# Patient Record
Sex: Male | Born: 1970 | Race: White | Hispanic: Yes | Marital: Married | Smoking: Never smoker
Health system: Southern US, Community
[De-identification: ages and names within clinical notes are randomized; demographics above are authoritative.]

## PROBLEM LIST (undated history)

## (undated) DIAGNOSIS — Z87898 Personal history of other specified conditions: Secondary | ICD-10-CM

## (undated) DIAGNOSIS — I10 Essential (primary) hypertension: Secondary | ICD-10-CM

## (undated) DIAGNOSIS — N400 Enlarged prostate without lower urinary tract symptoms: Secondary | ICD-10-CM

## (undated) DIAGNOSIS — I519 Heart disease, unspecified: Secondary | ICD-10-CM

## (undated) DIAGNOSIS — G709 Myoneural disorder, unspecified: Secondary | ICD-10-CM

## (undated) DIAGNOSIS — K219 Gastro-esophageal reflux disease without esophagitis: Secondary | ICD-10-CM

## (undated) DIAGNOSIS — IMO0001 Reserved for inherently not codable concepts without codable children: Secondary | ICD-10-CM

## (undated) DIAGNOSIS — Z5189 Encounter for other specified aftercare: Secondary | ICD-10-CM

## (undated) DIAGNOSIS — I2510 Atherosclerotic heart disease of native coronary artery without angina pectoris: Secondary | ICD-10-CM

## (undated) DIAGNOSIS — I729 Aneurysm of unspecified site: Secondary | ICD-10-CM

## (undated) DIAGNOSIS — E785 Hyperlipidemia, unspecified: Secondary | ICD-10-CM

## (undated) DIAGNOSIS — Z8774 Personal history of (corrected) congenital malformations of heart and circulatory system: Secondary | ICD-10-CM

## (undated) DIAGNOSIS — E119 Type 2 diabetes mellitus without complications: Secondary | ICD-10-CM

## (undated) DIAGNOSIS — Z531 Procedure and treatment not carried out because of patient's decision for reasons of belief and group pressure: Secondary | ICD-10-CM

## (undated) HISTORY — DX: Gastro-esophageal reflux disease without esophagitis: K21.9

## (undated) HISTORY — DX: Benign prostatic hyperplasia without lower urinary tract symptoms: N40.0

## (undated) HISTORY — DX: Personal history of other specified conditions: Z87.898

## (undated) HISTORY — PX: BACK SURGERY: SHX140

## (undated) HISTORY — DX: Encounter for other specified aftercare: Z51.89

## (undated) HISTORY — PX: APPENDECTOMY: SHX54

## (undated) HISTORY — DX: Hyperlipidemia, unspecified: E78.5

## (undated) HISTORY — DX: Personal history of (corrected) congenital malformations of heart and circulatory system: Z87.74

## (undated) HISTORY — DX: Heart disease, unspecified: I51.9

## (undated) HISTORY — PX: PATENT DUCTUS ARTERIOUS REPAIR: SHX269

## (undated) HISTORY — DX: Reserved for inherently not codable concepts without codable children: IMO0001

---

## 1999-10-12 ENCOUNTER — Ambulatory Visit (HOSPITAL_COMMUNITY): Admission: RE | Admit: 1999-10-12 | Discharge: 1999-10-12 | Payer: Self-pay | Admitting: Orthopedic Surgery

## 1999-10-12 ENCOUNTER — Encounter: Payer: Self-pay | Admitting: Orthopedic Surgery

## 2000-10-16 ENCOUNTER — Encounter: Payer: Self-pay | Admitting: Family Medicine

## 2000-10-16 ENCOUNTER — Ambulatory Visit (HOSPITAL_COMMUNITY): Admission: RE | Admit: 2000-10-16 | Discharge: 2000-10-16 | Payer: Self-pay | Admitting: Family Medicine

## 2000-10-27 ENCOUNTER — Ambulatory Visit (HOSPITAL_COMMUNITY): Admission: RE | Admit: 2000-10-27 | Discharge: 2000-10-27 | Payer: Self-pay | Admitting: Family Medicine

## 2000-10-27 ENCOUNTER — Encounter: Payer: Self-pay | Admitting: Family Medicine

## 2000-12-11 ENCOUNTER — Ambulatory Visit (HOSPITAL_COMMUNITY): Admission: RE | Admit: 2000-12-11 | Discharge: 2000-12-11 | Payer: Self-pay | Admitting: Internal Medicine

## 2001-07-17 ENCOUNTER — Ambulatory Visit (HOSPITAL_COMMUNITY): Admission: RE | Admit: 2001-07-17 | Discharge: 2001-07-17 | Payer: Self-pay | Admitting: *Deleted

## 2001-07-17 ENCOUNTER — Encounter: Payer: Self-pay | Admitting: *Deleted

## 2002-06-05 ENCOUNTER — Ambulatory Visit (HOSPITAL_COMMUNITY): Admission: RE | Admit: 2002-06-05 | Discharge: 2002-06-05 | Payer: Self-pay | Admitting: Family Medicine

## 2002-06-05 ENCOUNTER — Encounter: Payer: Self-pay | Admitting: Family Medicine

## 2003-11-06 ENCOUNTER — Emergency Department (HOSPITAL_COMMUNITY): Admission: EM | Admit: 2003-11-06 | Discharge: 2003-11-06 | Payer: Self-pay | Admitting: Emergency Medicine

## 2004-10-03 ENCOUNTER — Ambulatory Visit (HOSPITAL_COMMUNITY): Admission: RE | Admit: 2004-10-03 | Discharge: 2004-10-03 | Payer: Self-pay | Admitting: Cardiology

## 2005-03-28 ENCOUNTER — Emergency Department (HOSPITAL_COMMUNITY): Admission: EM | Admit: 2005-03-28 | Discharge: 2005-03-28 | Payer: Self-pay | Admitting: Emergency Medicine

## 2005-04-09 ENCOUNTER — Emergency Department (HOSPITAL_COMMUNITY): Admission: EM | Admit: 2005-04-09 | Discharge: 2005-04-09 | Payer: Self-pay | Admitting: Emergency Medicine

## 2005-07-08 ENCOUNTER — Ambulatory Visit: Payer: Self-pay | Admitting: Sports Medicine

## 2005-08-22 ENCOUNTER — Emergency Department (HOSPITAL_COMMUNITY): Admission: EM | Admit: 2005-08-22 | Discharge: 2005-08-22 | Payer: Self-pay | Admitting: Family Medicine

## 2005-09-09 ENCOUNTER — Emergency Department (HOSPITAL_COMMUNITY): Admission: EM | Admit: 2005-09-09 | Discharge: 2005-09-09 | Payer: Self-pay | Admitting: Family Medicine

## 2005-09-15 ENCOUNTER — Ambulatory Visit: Payer: Self-pay | Admitting: Sports Medicine

## 2005-09-19 HISTORY — PX: CARDIAC CATHETERIZATION: SHX172

## 2006-01-03 ENCOUNTER — Ambulatory Visit: Payer: Self-pay | Admitting: Family Medicine

## 2006-02-14 ENCOUNTER — Ambulatory Visit: Payer: Self-pay | Admitting: Family Medicine

## 2006-02-21 ENCOUNTER — Ambulatory Visit: Payer: Self-pay | Admitting: Family Medicine

## 2006-03-02 ENCOUNTER — Ambulatory Visit: Payer: Self-pay | Admitting: Family Medicine

## 2006-11-16 DIAGNOSIS — J45909 Unspecified asthma, uncomplicated: Secondary | ICD-10-CM | POA: Insufficient documentation

## 2006-11-16 DIAGNOSIS — E669 Obesity, unspecified: Secondary | ICD-10-CM | POA: Insufficient documentation

## 2006-11-16 DIAGNOSIS — J45901 Unspecified asthma with (acute) exacerbation: Secondary | ICD-10-CM | POA: Insufficient documentation

## 2006-11-16 DIAGNOSIS — F5232 Male orgasmic disorder: Secondary | ICD-10-CM

## 2006-11-24 ENCOUNTER — Telehealth: Payer: Self-pay | Admitting: *Deleted

## 2007-10-19 ENCOUNTER — Ambulatory Visit: Payer: Self-pay | Admitting: Family Medicine

## 2007-12-11 ENCOUNTER — Ambulatory Visit: Payer: Self-pay | Admitting: Family Medicine

## 2007-12-11 ENCOUNTER — Encounter: Payer: Self-pay | Admitting: Family Medicine

## 2007-12-11 LAB — CONVERTED CEMR LAB
Alkaline Phosphatase: 115 units/L (ref 39–117)
BUN: 17 mg/dL (ref 6–23)
CO2: 25 meq/L (ref 19–32)
Cholesterol: 217 mg/dL — ABNORMAL HIGH (ref 0–200)
Eosinophils Absolute: 0.1 10*3/uL (ref 0.0–0.7)
Eosinophils Relative: 2 % (ref 0–5)
Glucose, Bld: 90 mg/dL (ref 70–99)
HCT: 43.4 % (ref 39.0–52.0)
HDL: 34 mg/dL — ABNORMAL LOW (ref >39)
Hemoglobin: 14.8 g/dL (ref 13.0–17.0)
Lymphocytes Relative: 24 % (ref 12–46)
Lymphs Abs: 1.5 10*3/uL (ref 0.7–4.0)
MCV: 76.8 fL — ABNORMAL LOW (ref 78.0–100.0)
Monocytes Absolute: 0.4 10*3/uL (ref 0.1–1.0)
Monocytes Relative: 7 % (ref 3–12)
RBC: 5.65 M/uL (ref 4.22–5.81)
Total Bilirubin: 0.8 mg/dL (ref 0.3–1.2)
Triglycerides: 880 mg/dL — ABNORMAL HIGH (ref <150)
WBC: 6.1 10*3/uL (ref 4.0–10.5)

## 2007-12-12 ENCOUNTER — Encounter: Payer: Self-pay | Admitting: Family Medicine

## 2007-12-26 ENCOUNTER — Telehealth (INDEPENDENT_AMBULATORY_CARE_PROVIDER_SITE_OTHER): Payer: Self-pay | Admitting: Family Medicine

## 2007-12-27 ENCOUNTER — Encounter: Payer: Self-pay | Admitting: Family Medicine

## 2007-12-27 ENCOUNTER — Ambulatory Visit: Payer: Self-pay | Admitting: Family Medicine

## 2008-01-18 ENCOUNTER — Encounter: Payer: Self-pay | Admitting: Family Medicine

## 2008-01-25 HISTORY — PX: NM MYOCAR PERF WALL MOTION: HXRAD629

## 2008-01-31 ENCOUNTER — Ambulatory Visit: Payer: Self-pay | Admitting: Family Medicine

## 2008-01-31 DIAGNOSIS — I201 Angina pectoris with documented spasm: Secondary | ICD-10-CM

## 2008-02-01 ENCOUNTER — Encounter: Payer: Self-pay | Admitting: Family Medicine

## 2008-03-05 ENCOUNTER — Emergency Department (HOSPITAL_COMMUNITY): Admission: EM | Admit: 2008-03-05 | Discharge: 2008-03-05 | Payer: Self-pay | Admitting: Emergency Medicine

## 2008-05-22 ENCOUNTER — Ambulatory Visit: Payer: Self-pay | Admitting: Family Medicine

## 2008-05-22 ENCOUNTER — Telehealth: Payer: Self-pay | Admitting: *Deleted

## 2008-06-05 ENCOUNTER — Telehealth: Payer: Self-pay | Admitting: *Deleted

## 2008-12-12 ENCOUNTER — Encounter: Payer: Self-pay | Admitting: Family Medicine

## 2009-05-08 ENCOUNTER — Emergency Department (HOSPITAL_COMMUNITY): Admission: EM | Admit: 2009-05-08 | Discharge: 2009-05-08 | Payer: Self-pay | Admitting: Emergency Medicine

## 2009-07-10 ENCOUNTER — Telehealth: Payer: Self-pay | Admitting: Sports Medicine

## 2009-07-10 ENCOUNTER — Ambulatory Visit: Payer: Self-pay | Admitting: Family Medicine

## 2009-12-03 ENCOUNTER — Emergency Department (HOSPITAL_COMMUNITY)
Admission: EM | Admit: 2009-12-03 | Discharge: 2009-12-03 | Payer: Self-pay | Source: Home / Self Care | Admitting: Emergency Medicine

## 2009-12-03 ENCOUNTER — Encounter: Payer: Self-pay | Admitting: Sports Medicine

## 2009-12-31 ENCOUNTER — Emergency Department (HOSPITAL_COMMUNITY): Admission: EM | Admit: 2009-12-31 | Discharge: 2009-12-31 | Payer: Self-pay | Admitting: Emergency Medicine

## 2010-07-07 ENCOUNTER — Encounter: Payer: Self-pay | Admitting: Family Medicine

## 2010-07-12 ENCOUNTER — Encounter: Payer: Self-pay | Admitting: Sports Medicine

## 2010-08-24 ENCOUNTER — Emergency Department (HOSPITAL_COMMUNITY)
Admission: EM | Admit: 2010-08-24 | Discharge: 2010-08-25 | Payer: Self-pay | Source: Home / Self Care | Admitting: Emergency Medicine

## 2010-08-24 ENCOUNTER — Telehealth: Payer: Self-pay | Admitting: *Deleted

## 2010-08-24 ENCOUNTER — Encounter: Payer: Self-pay | Admitting: Sports Medicine

## 2010-08-24 ENCOUNTER — Ambulatory Visit: Payer: Self-pay | Admitting: Family Medicine

## 2010-08-24 ENCOUNTER — Ambulatory Visit (HOSPITAL_COMMUNITY)
Admission: RE | Admit: 2010-08-24 | Discharge: 2010-08-24 | Payer: Self-pay | Source: Home / Self Care | Admitting: Family Medicine

## 2010-08-24 DIAGNOSIS — R079 Chest pain, unspecified: Secondary | ICD-10-CM

## 2010-08-25 ENCOUNTER — Telehealth: Payer: Self-pay | Admitting: *Deleted

## 2010-08-27 ENCOUNTER — Ambulatory Visit: Payer: Self-pay | Admitting: Family Medicine

## 2010-09-01 ENCOUNTER — Encounter: Payer: Self-pay | Admitting: Sports Medicine

## 2010-09-01 ENCOUNTER — Ambulatory Visit (HOSPITAL_COMMUNITY)
Admission: RE | Admit: 2010-09-01 | Discharge: 2010-09-01 | Payer: Self-pay | Source: Home / Self Care | Admitting: Family Medicine

## 2010-09-01 ENCOUNTER — Inpatient Hospital Stay (HOSPITAL_COMMUNITY)
Admission: AD | Admit: 2010-09-01 | Discharge: 2010-09-02 | Payer: Self-pay | Attending: Family Medicine | Admitting: Family Medicine

## 2010-09-01 ENCOUNTER — Encounter (INDEPENDENT_AMBULATORY_CARE_PROVIDER_SITE_OTHER): Payer: Self-pay | Admitting: Emergency Medicine

## 2010-09-02 HISTORY — PX: CARDIAC CATHETERIZATION: SHX172

## 2010-09-07 ENCOUNTER — Ambulatory Visit: Payer: Self-pay | Admitting: Family Medicine

## 2010-09-07 ENCOUNTER — Encounter: Payer: Self-pay | Admitting: Sports Medicine

## 2010-09-07 DIAGNOSIS — E1142 Type 2 diabetes mellitus with diabetic polyneuropathy: Secondary | ICD-10-CM | POA: Insufficient documentation

## 2010-09-07 DIAGNOSIS — K219 Gastro-esophageal reflux disease without esophagitis: Secondary | ICD-10-CM | POA: Insufficient documentation

## 2010-09-15 ENCOUNTER — Telehealth: Payer: Self-pay | Admitting: *Deleted

## 2010-10-19 NOTE — Progress Notes (Signed)
Summary: Triage call  Phone Note Call from Patient   Caller: Patient Call For: [phone] Summary of Call: Want to come in this afternnoon for congestion with cough.  Vomitting.  Asthma flareup.  Daughter Luther Parody dob 06/21/1995 is having congestion with cough.   Symptoms ongoing x 1 wk.   Initial call taken by: Abundio Miu,  August 24, 2010 10:35 AM  Follow-up for Phone Call        Both he and daughter have had cough and congesiton for past 3 dyas and is not getitng any better.  Feels like it may be bronchitis.  Would like to be seen.  Gave a [place] appt for both this pm. Follow-up by: Dennison Nancy RN,  August 24, 2010 10:49 AM

## 2010-10-19 NOTE — Miscellaneous (Signed)
Summary: feels weak  Clinical Lists Changes has chills & weak x 2 days. lightheaded. "minor cp" located at L &center of chest. comes & goes. take aleve & asa. SOB , he attributes to his asthma. sweaty due to working outside. endorses nausea. reviewed chart. sent to ED. he agrees.Golden Circle RN  December 03, 2009 2:01 PM

## 2010-10-19 NOTE — Assessment & Plan Note (Signed)
Summary: Cough and congestion/kf   Vital Signs:  Patient profile:   40 year old male Weight:      226 pounds BMI:     34.49 Temp:     98.2 degrees F oral Pulse rate:   97 / minute Pulse rhythm:   regular BP sitting:   126 / 88  (left arm) Cuff size:   regular  Vitals Entered By: Loralee Pacas CMA (August 24, 2010 3:23 PM) CC: cough and congestion   Primary Provider:  Rodney Langton MD  CC:  cough and congestion.  History of Present Illness: Pt is a 40 year old male with HLD, Hypertriglicerademia, obesity, asthma, and an abnormal myoview in 2006 who presents today with a one month history chest pain that has been made worse by exertion.  It has been getting gradually worse throughout the month but has been made particularly worse this past week.  For the past week he has been having increased congestion, cough productive of dark green sputum with occasional blood, and increased wheezing.  He has not been running a fever but he has had some chills.  His chest pain has become significantly worse and was particularly bad this morning, which is part of the reason came in to be seen today.  It is left sided to substernal, crushing in character, radiates down his arm, is made worse with exertion and better with rest, and is associated with some left hand numbness.  The patient has been seen by Dr. Nadara Eaton of [facility] before and has been followed there.  Unfortunately, he was uninsured until recently and was unable to afford care for about the past six months.  He ran out of medications a month ago and has been without either his asthma medications or the medications he takes for his lipids for the past month.  (He is uncertain exactly which medications he takes for lipids and, as best as I can tell, these were prescribed by his cardiologist not [place].)  Allergies: No Known Drug Allergies  Past History:  Past Medical History: Last updated: 05/22/2008 hx PDA repair  age 58 h/o cardiac cath [facility] cards) in 2007 asthma (hsop for exacerbation 10 years ago)   Family History: Last updated: 12/27/2007 Father had MI in 55s Mother had CABG  Risk Factors: Smoking Status: never (12/11/2007)  Family History: Reviewed history from 12/27/2007 and no changes required. Father had MI in 71s Mother had CABG  Social History: Reviewed history from 12/11/2007 and no changes required. Lives with wife and 2 kids.  Works for [employer].  Review of Systems       The patient complains of chest pain, dyspnea on exertion, prolonged cough, and hemoptysis.  The patient denies anorexia, fever, weight loss, weight gain, vision loss, decreased hearing, hoarseness, syncope, peripheral edema, headaches, abdominal pain, melena, hematochezia, severe indigestion/heartburn, muscle weakness, suspicious skin lesions, transient blindness, and difficulty walking.    Physical Exam  General:  Large man with audiable wheezing Nose:  moving good air Neck:  Short neck, no adenopathy Lungs:  Diffuse wheezing with somewhat decreased air movement Heart:  normal rate and regular rhythm.  normal rate and regular rhythm.   Abdomen:  soft, non-tender, normal bowel sounds, and no masses.   Msk:  5+ strength all extremities Pulses:  2+ radial and posterior tibial bilaterally Extremities:  No clubbing, cyanosis, edema, or deformity noted with normal full range of motion of all joints.     Impression &  Recommendations:  Problem # 1:  CHEST PAIN (ICD-786.50) EKG demonstrates normal sinus rhythm with no acute ST segment changes.  Feel that the patient is likely not experiencing ACS but we cannot rule this out.  The patient does have a strong history that is worrisome for cardiac disease and, at least from a review of our records, would appear to have possible coronary artery disease.  Other processes that are to be considered are pneumonia and chest wall pain.  Recommend that the  patient proceed to the emergency room to be ruled out for ACS and to have a CXR performed to rule out any possible pneumonia.  Do not feel that this patient will likely require an admission.  Will also arrange for the patient to have a referral to cardiology, as they have seen him in the past.  Orders: EKG- [facility] (EKG) Cardiology Referral (Cardiology) [facility]- Est Level  3 ([address]366)  Problem # 2:  ASTHMA, PERSISTENT (ICD-493.90)  Patient is in an asthma exacerbation and this likely contributes to all his symptoms.  His oxygen saturation is 97% here on room air and he is in no respiratory distress.  Will give a single dose of IM solumedrol and prescribe a course of by mouth prednisone.  Will also refill the patient's home medications for this problem.  Recommend followup in the next 2-3 days to ensure resolution.  His updated medication list for this problem includes:    Albuterol Sulfate (2.5 Mg/30ml) 0.083% Nebu (Albuterol sulfate) .Marland KitchenMarland KitchenMarland KitchenMarland Kitchen 3 ml per treatment or 2.5 mg per treatment q 4 hours as needed for wheezing, qs    Ventolin Hfa 108 (90 Base) Mcg/act Aers (Albuterol sulfate) .Marland Kitchen... 2 putts qid as needed    Singulair 10 Mg Tabs (Montelukast sodium) ..... One daily    Qvar 80 Mcg/act Aers (Beclomethasone dipropionate) ..... One puff two times a day    Prednisone 50 Mg Tabs (Prednisone) .Marland Kitchen... Take one daily by mouth for 7 days  Orders: Solumedrol up to 125mg  (Y4034) [facility]- Est Level  3 (74259)  Complete Medication List: 1)  Albuterol Sulfate (2.5 Mg/59ml) 0.083% Nebu (Albuterol sulfate) .... 3 ml per treatment or 2.5 mg per treatment q 4 hours as needed for wheezing, qs 2)  Flexeril 5 Mg Tabs (Cyclobenzaprine hcl) .Marland Kitchen.. 1 tab by mouth at bedtime for muscle spasm 3)  Breatherite Coll Spacer Adult Misc (Spacer/aero-holding chambers) .... Use spacer with inhaler to get more medicine in your lungs 4)  Ventolin Hfa 108 (90 Base) Mcg/act Aers (Albuterol sulfate) .... 2 putts qid as needed 5)  Niaspan 500 Mg  Tbcr (Niacin (antihyperlipidemic)) .Marland Kitchen.. 1 tab by mouth qhs 6)  Trilipix 135 Mg Cpdr (Choline fenofibrate) .Marland Kitchen.. 1 tab by mouth daily. 7)  Lovaza 1 Gm Caps (Omega-3-acid ethyl esters) .Marland Kitchen.. 1 tab by mouth daily. 8)  Doxycycline Hyclate 100 Mg Tabs (Doxycycline hyclate) .... Two times a day for 10 days 9)  Singulair 10 Mg Tabs (Montelukast sodium) .... One daily 10)  Qvar 80 Mcg/act Aers (Beclomethasone dipropionate) .... One puff two times a day 11)  Prednisone 50 Mg Tabs (Prednisone) .... Take one daily by mouth for 7 days  Patient Instructions: 1)  It was good to see you today. 2)  Please go over the the emergency room for a further evaluation of your chest pain.  I do not think you are having a heart attack but we can't be sure here in the office. 3)  I have given you a prescription for some  steroids by mouth.  Please take them for your asthma. 4)  I have refilled your asthma medications. 5)  We will make a referral to cardiology.  You will be contacted with an appointment. 6)  Please come back in 2-3 days so we can make sure your breathing is getting better. Prescriptions: QVAR 80 MCG/ACT AERS (BECLOMETHASONE DIPROPIONATE) one puff two times a day Brand medically necessary #1 x 11   Entered and Authorized by:   Majel Homer MD   Signed by:   Majel Homer MD on 08/24/2010   Method used:   Electronically to        [facility] [address] (retail)       [address]       [place], [place]  [address]       Ph: [phone] or [phone]       Fax: [phone]   RxID:   [id number] PREDNISONE 50 MG TABS (PREDNISONE) Take one daily by mouth for 7 days  #7 x 0   Entered and Authorized by:   Majel Homer MD   Signed by:   Majel Homer MD on 08/24/2010   Method used:   Electronically to        [facility] [address] (retail)       [address]       [place], [place]  [address]       Ph: [phone] or  [phone]       Fax: [phone]   RxID:   [id number] SINGULAIR 10 MG TABS (MONTELUKAST SODIUM) one daily Brand medically necessary #30 x 11   Entered and Authorized by:   Majel Homer MD   Signed by:   Majel Homer MD on 08/24/2010   Method used:   Electronically to        [facility] [address] (retail)       [address]       [place], [place]  [address]       Ph: [phone] or [phone]       Fax: [phone]   RxID:   [id number] VENTOLIN HFA 108 (90 BASE) MCG/ACT AERS (ALBUTEROL SULFATE) 2 putts qid as needed  #1 x 6   Entered and Authorized by:   Majel Homer MD   Signed by:   Majel Homer MD on 08/24/2010   Method used:   Electronically to        [facility] [address] (retail)       [address]       [place], [place]  [address]       Ph: [phone] or [phone]       Fax: [phone]   RxID:   [id number] ALBUTEROL SULFATE (2.5 MG/3ML) 0.083% NEBU (ALBUTEROL SULFATE) 3 ml per treatment or 2.5 mg per treatment q 4 hours as needed for wheezing, QS  #1 x 11   Entered and Authorized by:   Majel Homer MD   Signed by:   Majel Homer MD on 08/24/2010   Method used:   Electronically to        [facility] [address] (retail)       [address]       [facility]  [place], [place]  [address]       Ph: [phone] or [phone]       Fax: [phone]   RxID:   [id number]    Medication Administration  Injection # 1:    Medication: Solumedrol up to 125mg     Diagnosis: ASTHMA, PERSISTENT (ICD-493.90)    Route: IM    Site: RUOQ gluteus    Exp Date: 12/18/2012    Lot #: [id number]    Mfr: [email or web address]    Patient tolerated injection without complications    Given by: Arlyss Repress CMA, (August 24, 2010 5:36 PM)  Orders Added: 1)  EKG- [facility] [EKG] 2)  Cardiology Referral [Cardiology] 3)  Solumedrol up to 125mg  [J2930] 4)  [facility]- Est Level  3  [[address]]     Medication Administration  Injection # 1:    Medication: Solumedrol up to 125mg     Diagnosis: ASTHMA, PERSISTENT (ICD-493.90)    Route: IM    Site: RUOQ gluteus    Exp Date: 12/18/2012    Lot #: [id number]    Mfr: pfizer    Patient tolerated injection without complications    Given by: Arlyss Repress CMA, (August 24, 2010 5:36 PM)  Orders Added: 1)  EKG- [facility] [EKG] 2)  Cardiology Referral [Cardiology] 3)  Solumedrol up to 125mg  [J2930] 4)  [facility]- Est Level  3 [[address]]

## 2010-10-19 NOTE — Progress Notes (Signed)
 Summary: triage  Phone Note Call from Patient Call back at [phone]   Caller: Patient Summary of Call: pt asking to be seen this afternoon because feeling congested and asthma problems. Initial call taken by: Madelin Daring,  July 10, 2009 1:45 PM  Follow-up for Phone Call        taking meds but feeling worse since last night. work in at 3. aware of wait time Follow-up by: Ginnie Mau RN,  July 10, 2009 1:49 PM  Additional Follow-up for Phone Call Additional follow up Details #1::        Noted, thanks. Additional Follow-up by: Debby Petties MD,  July 10, 2009 1:52 PM

## 2010-10-19 NOTE — Miscellaneous (Signed)
  Clinical Lists Changes  Problems: Changed problem from ASTHMA, UNSPECIFIED (ICD-493.90) to ASTHMA, PERSISTENT (ICD-493.90) 

## 2010-10-19 NOTE — Miscellaneous (Signed)
  Clinical Lists Changes  Medications: Changed medication from VENTOLIN HFA 108 (90 BASE) MCG/ACT AERS (ALBUTEROL SULFATE) 2 puffs q 4 hours as needed to VENTOLIN HFA 108 (90 BASE) MCG/ACT AERS (ALBUTEROL SULFATE) 2 putts qid as needed - Signed Rx of VENTOLIN HFA 108 (90 BASE) MCG/ACT AERS (ALBUTEROL SULFATE) 2 putts qid as needed;  #1 x 6;  Signed;  Entered by: Luretha Murphy NP;  Authorized by: Luretha Murphy NP;  Method used: Electronically to [facility]  [phone]*, [address] [place], [place], [place]  [address], Ph: [phone] or [phone], Fax: [phone]    Prescriptions: VENTOLIN HFA 108 (90 BASE) MCG/ACT AERS (ALBUTEROL SULFATE) 2 putts qid as needed  #1 x 6   Entered and Authorized by:   Luretha Murphy NP   Signed by:   Luretha Murphy NP on 07/07/2010   Method used:   Electronically to        [facility]  [phone]* (retail)       [address]       [place], [place]  [address]       Ph: [phone] or [phone]       Fax: [phone]   RxID:   [id number]

## 2010-10-19 NOTE — Progress Notes (Signed)
Summary: referral  Phone Note Call from Patient Call back at Home Phone [phone]   Caller: Patient Summary of Call: pt was told to call today to find out about cardiology referral Initial call taken by: De Nurse,  August 25, 2010 2:02 PM  Follow-up for Phone Call        pt called again about referral  has appt w. Dr T 12/14 Follow-up by: De Nurse,  August 26, 2010 4:17 PM  Additional Follow-up for Phone Call Additional follow up Details #1::        referral made to [facility], informed pt of this Additional Follow-up by: Loralee Pacas CMA,  August 27, 2010 10:07 AM

## 2010-10-21 NOTE — Assessment & Plan Note (Signed)
Summary: hosp follow up/tlb   Vital Signs:  Patient profile:   40 year old male Weight:      221 pounds Temp:     98.9 degrees F oral Pulse rate:   108 / minute Pulse rhythm:   regular BP sitting:   135 / 83  (left arm) Cuff size:   regular  Vitals Entered By: Loralee Pacas CMA (September 07, 2010 11:16 AM)  Primary Care Kennetta Pavlovic:  Rodney Langton MD   History of Present Illness: 40 yo male here for HFU.  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  No MI during hospitalization.  Risk factor management recommended.  On ASA 325.  Still gets occasional CP.  Cath site on R wrist well healed.  Currently wearing event monitor placed by cards.  DM2:  A1c 6.2% in hospital.  Started on metformin XR which he has not yet started taking.  HLD:  Restarted on fenofibrate and pravastatin.    Hematochezia:   Noted small flecs blood in recent stool.  No weight loss, constipation, pencil stools.  Reflux:  Has had heartburn for a long time now, needs refill on omeprazole.  Current Medications (verified): 1)  Breatherite Coll Spacer Adult   Misc (Spacer/aero-Holding Chambers) .... Use Spacer With Inhaler To Get More Medicine in Your Lungs 2)  Ventolin Hfa 108 (90 Base) Mcg/act Aers (Albuterol Sulfate) .... 2 Putts Qid As Needed 3)  Singulair 10 Mg Tabs (Montelukast Sodium) .... One Daily 4)  Advair Diskus 500-50 Mcg/dose Aepb (Fluticasone-Salmeterol) .... One Puff Two Times A Day 5)  Pravastatin Sodium 40 Mg Tabs (Pravastatin Sodium) .... One Tab By Mouth Daily 6)  Trilipix 135 Mg Cpdr (Choline Fenofibrate) .... One Tab By Mouth Daily 7)  Aspirin 81 Mg Tbec (Aspirin) .... One Tab By Mouth Daily 8)  Prilosec 40 Mg Cpdr (Omeprazole) .... One Tab By Mouth At Dinnertime.  Allergies (verified): No Known Drug Allergies  Past History:  Past Medical History: Last updated:  09/01/2010 Familial hyperlipidemia Asthma  Past Surgical History: hx PDA repair age 70 clean cardiac cath [facility] cards) in 2007, and with RCA 30% narrowing likely due to catheter induced spasm in 09/02/2010.  Review of Systems       See HPI  Physical Exam  General:  Well-developed,well-nourished,in no acute distress; alert,appropriate and cooperative throughout examination Lungs:  Normal respiratory effort, chest expands symmetrically. Lungs are clear to auscultation, no crackles or wheezes. Heart:  Normal rate and regular rhythm. S1 and S2 normal without gallop, murmur, click, rub or other extra sounds. Abdomen:  Bowel sounds positive,abdomen soft and non-tender without masses, organomegaly or hernias noted. Extremities:  No edema.  R wrist catheter site well healed.   Impression & Recommendations:  Problem # 1:  HEMATOCHEZIA (ICD-578.1) Assessment New Stool cards given.  Orders: Hemoccult Cards (Take Home) (Hemoccult Cards)  Problem # 2:  GERD (ICD-530.81) Assessment: New Better s/p GI cocktail in office. Omeprazole rxed.  His updated medication list for this problem includes:    Prilosec 40 Mg Cpdr (Omeprazole) ..... One tab by mouth at dinnertime.  Orders: [facility]- Est  Level 4 ([address]) EMR miscellaneous medications (EMRORAL)  Problem # 3:  CHEST PAIN (ICD-786.50) Assessment: Improved Occasionally present still.  Unlikely cardiac in origin. Pt will make fu appt with cards in 1-2 weeks [facility]) We can consider long acting nitrates vs dyhidropyridine CCB if persists.  Orders: [facility]- Est  Level 4 ([address])  Problem # 4:  HYPERTRIGLYCERIDEMIA, SEVERE (ICD-272.4) Assessment: Improved Fenofibrate and pravachol. Will recheck FLP with dLDL in 3 months.  The following medications were removed from the medication list:    Niaspan 500 Mg Tbcr (Niacin (antihyperlipidemic)) .Marland Kitchen... 1 tab by mouth qhs    Trilipix 135 Mg Cpdr (Choline fenofibrate) .Marland Kitchen... 1 tab by mouth  daily.    Lovaza 1 Gm Caps (Omega-3-acid ethyl esters) .Marland Kitchen... 1 tab by mouth daily. His updated medication list for this problem includes:    Pravastatin Sodium 40 Mg Tabs (Pravastatin sodium) ..... One tab by mouth daily    Trilipix 135 Mg Cpdr (Choline fenofibrate) ..... One tab by mouth daily  Problem # 5:  DIABETES MELLITUS, TYPE II (ICD-250.00) Assessment: New Cont metformin. Rechecking A1c in 3 months.  His updated medication list for this problem includes:    Aspirin 81 Mg Tbec (Aspirin) ..... One tab by mouth daily    Metformin Hcl 500 Mg Xr24h-tab (Metformin hcl)  Complete Medication List: 1)  Breatherite Coll Spacer Adult Misc (Spacer/aero-holding chambers) .... Use spacer with inhaler to get more medicine in your lungs 2)  Ventolin Hfa 108 (90 Base) Mcg/act Aers (Albuterol sulfate) .... 2 putts qid as needed 3)  Singulair 10 Mg Tabs (Montelukast sodium) .... One daily 4)  Advair Diskus 500-50 Mcg/dose Aepb (Fluticasone-salmeterol) .... One puff two times a day 5)  Pravastatin Sodium 40 Mg Tabs (Pravastatin sodium) .... One tab by mouth daily 6)  Trilipix 135 Mg Cpdr (Choline fenofibrate) .... One tab by mouth daily 7)  Aspirin 81 Mg Tbec (Aspirin) .... One tab by mouth daily 8)  Prilosec 40 Mg Cpdr (Omeprazole) .... One tab by mouth at dinnertime. 9)  Metformin Hcl 500 Mg Xr24h-tab (Metformin hcl)  Patient Instructions: 1)  Great to see you, 2)  You can return to work. 3)  Continue taking all your meds as directed. 4)  Call the cardiologist for an appt in the next 1-2 weeks. 5)  Checking stool cards for your blood in the stool. 6)  Omeprazole for hearburn. 7)  Change aspirin to 81mg  daily, stop the 325. 8)  Come back to see me in a month to see how you are doing. 9)  We can space out visits after that. 10)  -Dr. Karie Schwalbe. Prescriptions: PRILOSEC 40 MG CPDR (OMEPRAZOLE) One tab by mouth at dinnertime.  #90 x 3   Entered and Authorized by:   Rodney Langton MD   Signed  by:   Rodney Langton MD on 09/07/2010   Method used:   Electronically to        [facility] [address] (retail)       [address]       [place], [place]  [address]       Ph: [phone] or [phone]       Fax: [phone]   RxID:   [id number]    Medication Administration  Medication # 1:    Medication: EMR miscellaneous medications    Diagnosis: GERD (ICD-530.81)    Dose: 30ml    Route: po    Exp Date: 12/25/2010    Lot #: [id number]    Mfr: [facility]    Patient tolerated medication without complications    Given by: Loralee Pacas CMA (September 07, 2010 12:15 PM)  Orders Added: 1)  [facility]- Est  Level 4 [[address]] 2)  Hemoccult  Cards (Take Home) [Hemoccult Cards] 3)  EMR miscellaneous medications [EMRORAL]     Appended Document: hosp follow up/tlb    Clinical Lists Changes  Medications: Added new medication of AMLODIPINE BESYLATE 2.5 MG TABS (AMLODIPINE BESYLATE) One tab by mouth daily - Signed Rx of AMLODIPINE BESYLATE 2.5 MG TABS (AMLODIPINE BESYLATE) One tab by mouth daily;  #90 x 3;  Signed;  Entered by: Rodney Langton MD;  Authorized by: Rodney Langton MD;  Method used: Electronically to [facility] [address] [place], [place], [place]  [address], Ph: [phone] or [phone], Fax: [phone]    Prescriptions: AMLODIPINE BESYLATE 2.5 MG TABS (AMLODIPINE BESYLATE) One tab by mouth daily  #90 x 3   Entered and Authorized by:   Rodney Langton MD   Signed by:   Rodney Langton MD on 09/07/2010   Method used:   Electronically to        [facility] [address] (retail)       [address]       [place], [place]  [address]       Ph: [phone] or [phone]       Fax: [phone]   RxID:   [id number]    Appended Document: hemoccult cards NEGATIVE    Lab Visit  Laboratory Results  Date/Time  Received: September 14, 2010  Date/Time Reported: September 14, 2010 4:40 PM   Stool - Occult Blood Hemmoccult #1: negative Date: 09/07/2010 Hemoccult #2: negative Hemoccult #3: negative Comments: no dates on cards 2 or 3 ...............test performed by......Marland KitchenBonnie A. [place], MLS (ASCP)cm   Orders Today: Miscellaneous Lab Charge-[facility] [phone]

## 2010-10-21 NOTE — Assessment & Plan Note (Signed)
Summary: HOSPITAL ADMISSION  pt requested that we call his employer [employer] Pac[employer] [phone] to inform them that he was being admitted to the [facility]  September 01, 2010 11:42 AM   Vital Signs:  Patient profile:   40 year old male Height:      68 inches Weight:      221.1 pounds BMI:     33.74 Temp:     97.7 degrees F oral Pulse rate:   91 / minute BP sitting:   115 / 80  (left arm) Cuff size:   regular  Vitals Entered By: Jimmy Footman, CMA (September 01, 2010 9:39 AM) CC: follow-up visit, numbness in left hand & fingers Is Patient Diabetic? No   Primary Care Provider:  Rodney Langton MD  CC:  follow-up visit and numbness in left hand & fingers.  History of Present Illness: 40 yo male with hx familial hyperlipidema and non-compliance with meds due to financial issues here with CP.  CP:  Pt has been seen by Dr. Jacinto Halim as [facility] several times, cath clean in 2006, [place] in 2009 with ant ischemia but low risk as pt got to 12 mets.  Decision made to tx aggressively with medical mgt and re-cath if CP persistent.  Pt has been having months of CP, substernal described as pressure, radiates to L arm and causes numbness, nausae and diaphoresis present, worse with exertion, better with rest, lasts a few mins, better with NTG.  Pt has recently started to have episodes at rest.  In office given NTG x 2 and CP resolved.  Placed on O2 and given ASA to chew.  Habits & Providers  Alcohol-Tobacco-Diet     Tobacco Status: never  Current Medications (verified): 1)  Albuterol Sulfate (2.5 Mg/91ml) 0.083% Nebu (Albuterol Sulfate) .... 3 Ml Per Treatment or 2.5 Mg Per Treatment Q 4 Hours As Needed For Wheezing, Qs 2)  Flexeril 5 Mg  Tabs (Cyclobenzaprine Hcl) .Marland Kitchen.. 1 Tab By Mouth At Bedtime For Muscle Spasm 3)  Breatherite Coll Spacer Adult   Misc (Spacer/aero-Holding Chambers) .... Use Spacer With Inhaler To Get More Medicine in Your Lungs 4)  Ventolin Hfa 108 (90 Base) Mcg/act Aers  (Albuterol Sulfate) .... 2 Putts Qid As Needed 5)  Niaspan 500 Mg Tbcr (Niacin (Antihyperlipidemic)) .Marland Kitchen.. 1 Tab By Mouth Qhs 6)  Trilipix 135 Mg  Cpdr (Choline Fenofibrate) .Marland Kitchen.. 1 Tab By Mouth Daily. 7)  Lovaza 1 Gm  Caps (Omega-3-Acid Ethyl Esters) .Marland Kitchen.. 1 Tab By Mouth Daily. 8)  Singulair 10 Mg Tabs (Montelukast Sodium) .... One Daily 9)  Qvar 80 Mcg/act Aers (Beclomethasone Dipropionate) .... One Puff Two Times A Day  Allergies (verified): No Known Drug Allergies  Past History:  Family History: Last updated: 12/27/2007 Father had MI in 75s Mother had CABG  Social History: Last updated: 08/24/2010 Lives with wife and 2 kids.  Works for [employer].  Past Medical History: Familial hyperlipidemia Asthma  Past Surgical History: hx PDA repair age 66 clean cardiac cath [facility] cards) in 2007  Family History: Reviewed history from 12/27/2007 and no changes required. Father had MI in 79s Mother had CABG  Social History: Reviewed history from 08/24/2010 and no changes required. Lives with wife and 2 kids.  Works for [employer].  Review of Systems       See HPI  Physical Exam  General:  Well-developed,well-nourished,in no acute distress; alert,appropriate and cooperative throughout examination Head:  Normocephalic and atraumatic without obvious abnormalities. No apparent alopecia or  balding. Eyes:  No corneal or conjunctival inflammation noted. EOMI. Perrla.  Ears:  External ear exam shows no significant lesions or deformities.   Nose:  External nasal examination shows no deformity or inflammation.  Mouth:  Oral mucosa and oropharynx without lesions or exudates.   Neck:  No deformities, masses, or tenderness noted. Lungs:  Normal respiratory effort, chest expands symmetrically. Lungs are clear to auscultation, no crackles or wheezes. Heart:  Normal rate and regular rhythm. S1 and S2 normal without gallop, murmur, click, rub or other extra sounds. Abdomen:  Bowel  sounds positive,abdomen soft and non-tender without masses, organomegaly or hernias noted. Pulses:  R and L carotid,radial,femoral,dorsalis pedis and posterior tibial pulses are full and equal bilaterally Extremities:  No clubbing, cyanosis, edema, or deformity noted with normal full range of motion of all joints.   Neurologic:  No cranial nerve deficits noted. Station and gait are normal. Plantar reflexes are down-going bilaterally. DTRs are symmetrical throughout. Sensory, motor and coordinative functions appear intact. Skin:  Intact without suspicious lesions or rashes Additional Exam:  ECG:  NSR, rate 97, no ST changes, RBBB new since last ECG, borderline RAD.   Impression & Recommendations:  Problem # 1:  CHEST PAIN (ICD-786.50) Assessment New Typical CP. s/p NTG and ASA in office. [facility] transfer to SDU (no tele beds available) Heparin drip. Plavix 300mg  load followed by 75mg  daily. ASA 325 daily. Discussed with Dr. Royann Shivers at [facility], inpatient cards team will see pt in hospital, likely cath. Risk stratify with FLP, A1c in AM ECG in am.  Orders: [facility]- Est Level  5 (99215) NTG 1/150 gr tab [facility])  Problem # 2:  HYPERLIPIDEMIA (ICD-272.4) Assessment: Deteriorated Pt not taking any of the below medications. Will check FLP in am. Pt to likely need Crestor or other potent statin.  His updated medication list for this problem includes:    Niaspan 500 Mg Tbcr (Niacin (antihyperlipidemic)) .Marland Kitchen... 1 tab by mouth qhs    Trilipix 135 Mg Cpdr (Choline fenofibrate) .Marland Kitchen... 1 tab by mouth daily.    Lovaza 1 Gm Caps (Omega-3-acid ethyl esters) .Marland Kitchen... 1 tab by mouth daily.  Orders: [facility]- Est Level  5 (54098)  Problem # 3:  ASTHMA, PERSISTENT (ICD-493.90) Assessment: Unchanged Will restart the below home meds  His updated medication list for this problem includes:    Albuterol Sulfate (2.5 Mg/2ml) 0.083% Nebu (Albuterol sulfate) .Marland KitchenMarland KitchenMarland KitchenMarland Kitchen 3 ml per treatment or 2.5 mg per treatment q 4  hours as needed for wheezing, qs    Singulair 10 Mg Tabs (Montelukast sodium) ..... One daily    Qvar 80 Mcg/act Aers (Beclomethasone dipropionate) ..... One puff two times a day  Problem # 4:  CORONARY ARTERY DISEASE, PREMATURE (ICD-429.9) Assessment: Deteriorated See #1. Likely cath tomorrow per cards. If cath neg may need to consider anti-anginal such as Ranexa.  Orders: [facility]- Est Level  5 (11914)  Problem # 5:  FEN/GI Assessment: Comment Only HH reg diet Saline lock IV NPO past midnight.  Problem # 6:  PPx Heparin drip per pharmacy.  Problem # 7:  CODE STATUS FULL CODE  Complete Medication List: 1)  Albuterol Sulfate (2.5 Mg/18ml) 0.083% Nebu (Albuterol sulfate) .... 3 ml per treatment or 2.5 mg per treatment q 4 hours as needed for wheezing, qs 2)  Flexeril 5 Mg Tabs (Cyclobenzaprine hcl) .Marland Kitchen.. 1 tab by mouth at bedtime for muscle spasm 3)  Breatherite Coll Spacer Adult Misc (Spacer/aero-holding chambers) .... Use spacer with inhaler to get more medicine  in your lungs 4)  Ventolin Hfa 108 (90 Base) Mcg/act Aers (Albuterol sulfate) .... 2 putts qid as needed 5)  Niaspan 500 Mg Tbcr (Niacin (antihyperlipidemic)) .Marland Kitchen.. 1 tab by mouth qhs 6)  Trilipix 135 Mg Cpdr (Choline fenofibrate) .Marland Kitchen.. 1 tab by mouth daily. 7)  Lovaza 1 Gm Caps (Omega-3-acid ethyl esters) .Marland Kitchen.. 1 tab by mouth daily. 8)  Singulair 10 Mg Tabs (Montelukast sodium) .... One daily 9)  Qvar 80 Mcg/act Aers (Beclomethasone dipropionate) .... One puff two times a day   Medication Administration  Medication # 1:    Medication: ASA 325mg  tab    Diagnosis: CHEST PAIN (ICD-786.50)    Dose: 1 tablet    Route: po    Exp Date: 10/13/2010    Lot #: [id number]    Mfr: major    Patient tolerated medication without complications    Given by: Loralee Pacas CMA (September 01, 2010 11:37 AM)  Medication # 2:    Medication: NTG 1/150 gr tab    Diagnosis: CHEST PAIN (ICD-786.50)    Dose: 1tablets    Route: SL    Exp Date:  03/19/2011    Lot #: [id number]    Mfr: park-[place]    Patient tolerated medication without complications    Given by: Loralee Pacas CMA (September 01, 2010 11:38 AM)  Orders Added: 1)  [facility]- Est Level  5 [99215] 2)  NTG 1/150 gr tab [EMRORAL]     Appended Document: f/u,df Dictation number: [id number]

## 2010-10-21 NOTE — Progress Notes (Signed)
Summary: negative hemoccult cards  Phone Note Outgoing Call   Call placed by: Loralee Pacas CMA,  September 15, 2010 9:05 AM Summary of Call: called and informed pt of neg hemoccult cards

## 2010-10-21 NOTE — Letter (Signed)
Summary: Generic Letter  [facility]  [address]   [place], [place] [address]   Phone: [phone]  Fax: [phone]    09/07/2010  Cody Raymond [address] [facility] [address] [place], [place]  [address]  To whom it may concern,  [name] is a patient of mine, he is cleared to return to work without limitations.  Feel free to contact my office with questions.     Sincerely,   Rodney Langton MD

## 2010-11-10 ENCOUNTER — Ambulatory Visit (INDEPENDENT_AMBULATORY_CARE_PROVIDER_SITE_OTHER): Payer: Managed Care, Other (non HMO) | Admitting: Sports Medicine

## 2010-11-10 ENCOUNTER — Encounter: Payer: Self-pay | Admitting: Sports Medicine

## 2010-11-10 ENCOUNTER — Other Ambulatory Visit: Payer: Self-pay | Admitting: Family Medicine

## 2010-11-10 DIAGNOSIS — I519 Heart disease, unspecified: Secondary | ICD-10-CM

## 2010-11-10 DIAGNOSIS — R369 Urethral discharge, unspecified: Secondary | ICD-10-CM

## 2010-11-10 DIAGNOSIS — E119 Type 2 diabetes mellitus without complications: Secondary | ICD-10-CM

## 2010-11-10 LAB — POCT URINALYSIS DIPSTICK
Bilirubin, UA: NEGATIVE
Blood, UA: NEGATIVE
Glucose, UA: NEGATIVE
Leukocytes, UA: NEGATIVE
Urobilinogen, UA: 0.2

## 2010-11-10 LAB — CONVERTED CEMR LAB
BUN: 18 mg/dL (ref 6–23)
Chlamydia, Swab/Urine, PCR: NEGATIVE
Chloride: 102 meq/L (ref 96–112)
GC Probe Amp, Urine: NEGATIVE
PSA: 0.55 ng/mL (ref <=4.00)
Potassium: 4.1 meq/L (ref 3.5–5.3)
Sodium: 137 meq/L (ref 135–145)
TSH: 1.371 microintl units/mL (ref 0.350–4.500)

## 2010-11-10 LAB — PSA: PSA: 0.55 ng/mL (ref <=4.00)

## 2010-11-10 LAB — BASIC METABOLIC PANEL
BUN: 18 mg/dL (ref 6–23)
Chloride: 102 mEq/L (ref 96–112)
Potassium: 4.1 mEq/L (ref 3.5–5.3)
Sodium: 137 mEq/L (ref 135–145)

## 2010-11-10 LAB — TSH: TSH: 1.371 u[IU]/mL (ref 0.350–4.500)

## 2010-11-10 MED ORDER — TAMSULOSIN HCL 0.4 MG PO CAPS: 0.4000 mg | ORAL_CAPSULE | ORAL | Status: DC

## 2010-11-10 MED ORDER — AMLODIPINE BESYLATE 2.5 MG PO TABS: 2.5000 mg | ORAL_TABLET | Freq: Every day | ORAL | Status: DC

## 2010-11-10 MED ORDER — METFORMIN HCL 500 MG PO TABS: 500.0000 mg | ORAL_TABLET | Freq: Two times a day (BID) | ORAL | Status: DC

## 2010-11-10 NOTE — Assessment & Plan Note (Signed)
Gave list of places where he could get amlodipine for cheap. This will treat his coronary artery spasm. Should also take NTG if he gets CP, cath few months ago without clear CAD however spasm present. No current CP.

## 2010-11-10 NOTE — H&P (Signed)
Cody Raymond, Cody Raymond            ACCOUNT NO.:  [id number]  MEDICAL RECORD NO.:  [id number]          PATIENT TYPE:  INP  LOCATION:  2902                         FACILITY:  [facility]  PHYSICIAN:  Sarah Swaziland, MD       DATE OF BIRTH:  1971/01/11  DATE OF ADMISSION:  09/01/2010 DATE OF DISCHARGE:                             HISTORY & PHYSICAL   CHIEF COMPLAINT:  Chest pain.  HISTORY:  The patient is a 40 year old male with a history of familial hyperlipidemia and noncompliance with medications due to financial issues, here for chest pain.  The patient was seen by Dr. Jacinto Halim at [facility] several times.  He has a clean cardiac catheterization in 2006, a Myoview in 2009 with anterior ischemia but this is a low risk study as the patient did get to 12 mets. The decision was made to treat aggressively with medical management and re cath the patient if chest pain was persistent.  The patient returns to the office having had chest pain for months described as substernal, as a feeling of pressure radiating to the left arm causing numbness, nausea and diaphoresis are present.  The pain was worse with exertion, better with rest and lasted a few minutes.  It was also better with nitroglycerin.  The patient has recently started to have episodes at rest as well.  In the office he was given nitroglycerin x2 and aspirin 325 to chew and chest pain resolved.  He was placed on oxygen.  REVIEW OF SYSTEMS:  12 point review of systems is negative except as noted above in the HPI.  ALLERGIES:  NO KNOWN DRUG ALLERGIES.  PAST MEDICAL HISTORY:  Familial hyperlipidemia, asthma.  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  FAMILY HISTORY:  Father has had an MI in his 11s and mother had coronary artery bypass grafting.  SOCIAL HISTORY:  The  patient lives with his wife and two kids, worked for [employer].  No alcohol, tobacco or drug use.  MEDICATIONS: 1. Albuterol 2 puffs q.4h. as needed for wheezing. 2. Flexeril 5 mg p.o. q.h.s. for muscle spasms. 3. Niaspan 1 tablet p.o. q.h.s. 500 mg. 4. Trilipix 135 mg p.o. daily. 5. Lovaza 1 gram p.o. daily. 6. Singular 10 mg p.o. daily. 7. Qvar 80 mcg 1 puff b.i.d.  Of note, the patient has not been using any of his medications due to financial issues.  PHYSICAL EXAMINATION:  VITAL SIGNS:  Blood pressure 115/80, pulse 91, respirations 20, temperature 97.7 degrees Fahrenheit. GENERAL: Well-developed, well-nourished, well-hydrated, in minimal distress, alert, appropriate, and cooperative throughout exam.  HEENT: Normocephalic, atraumatic.  Pupils equally round and reactive to light. Extraocular muscles intact. NECK:  Supple.  Mucosae are moist.  External ear and nose exam is unremarkable.  No bruits noted on auscultation of carotids. CARDIOPULMONARY:  Regular rate and rhythm with no murmurs, rubs or gallops. LUNGS:  Clear to auscultation bilaterally. ABDOMEN:  Soft, nontender, and nondistended with positive bowel sounds. EXTREMITIES:  Warm, well-perfused  and without edema.  Pulses are palpable.  Dorsalis pedis, posterior tibial, femoral, radial and brachial. NEUROLOGIC EXAM:  Cranial nerves II-XII are intact.  Motor, sensory and coordinated functions appear intact. SKIN:  Without lesions or rashes.  DIAGNOSTIC DATA:  A 12-lead EKG was performed that was normal sinus rhythm, rate of 97, no ST changes, right bundle branch block that is new since the last EKG and borderline right axis deviation.  ASSESSMENT/PLAN: 1. Chest pain that is typical in nature and with worsening at rest is     likely unstable angina.  The patient is status post nitroglycerin     and aspirin the office.  We have arranged a [facility] transfer to     the step-down unit as no tele beds are available in  the hospital.     We will run a heparin drip per pharmacy, Plavix 300 mg load     followed by 75 mg p.o. daily, aspirin 325 p.o. daily.  I did     discuss the case with Dr. Royann Shivers at [facility] and     [facility]ascular Center.  He will have the inpatient team see the patient     in the hospital and he will likely require a cardiac     catheterization in the morning.  We will also risk stratify him     with a fasting lipid panel and hemoglobin A1c in the morning and     then a morning 12-lead EKG.  We will also do cardiac enzymes x3 q.8     h apart. 2. Hyperlipidemia.  The patient is not taking any of his home     medicines.  We will check a fasting lipid panel in the morning and     the patient will likely need Crestor or another high-potency statin     for control of his lipids. 3. Persistent asthma.  Though he is not taking any of his medicines,     we will restart the Qvar,  Singulair and albuterol in the hospital. 4. Coronary artery disease.  See #1, likely a cardiac cath tomorrow in     the hospital.  If cardiac catheterization is negative then we would     need to consider an anti anginal medication such as Ranexa. 5. FE GI heart healthy regular diet, saline lock IV and n.p.o. past     midnight. 6. Prophylaxis.  Heparin drip performed. 7. Code status.  The patient is a full code.     Monica Becton, MD   ______________________________ Sarah Swaziland, MD    TJT/MEDQ  D:  09/01/2010  T:  09/01/2010  Job:  [id number]  Electronically Signed by Rodney Langton MD on 09/28/2010 03:42:04 PM Electronically Signed by Sarah Swaziland  on 11/10/2010 04:38:10 PM

## 2010-11-10 NOTE — Progress Notes (Addendum)
  Subjective:    Patient ID: Cody Raymond, male    DOB: [birth year], 40 y.o.   MRN: [id number]  HPI Pt comes in with multiple complaints.  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  Currently has blurry vision in L eye.  When vision gets blurry, no concurrent shakes, sweating.  Fatigue:  Feels like he is going to fall asleep at work.  Doesn't think he snores (wife doesn't complain) but had sleep study done at [facility] 8 mos ago, doesn't know results.  Wears 18 inch collar.  Also had holter monitor done, doesn't know results.  CBC normal 08/2010 in hospital.    Voiding:  Doesn't sleep well and gets up several times to void.  Has weak stream, dribbling, occasional penile discharge.    Coronary artery spasm:  Cath done 08/2010, showed 30% stenosis that was thought to be spasm.  No stents.  Not taking amlodipine 2/2 price. Not taking NTG when he does get CP.  DM2:  Not taking metformin XR 2/2 cost.   Review of Systems    Neg except as in HPI. Objective:   Physical Exam  Constitutional: He appears well-developed and well-nourished. No distress.  HENT:  Head: Normocephalic and atraumatic.  Eyes: Conjunctivae and EOM are normal. Pupils are equal, round, and reactive to light. Right eye exhibits no discharge. Left eye exhibits no discharge. No scleral icterus.       ? Cataracts on fundoscopy with hazy reflection from cornea that blurs my view of the retina.  Cardiovascular: Normal rate, regular rhythm, normal heart sounds and intact distal pulses.  Exam reveals no gallop and no friction rub.   No murmur heard. Pulmonary/Chest: Effort normal and breath sounds normal. No respiratory distress. He has no wheezes. He has no rales. He exhibits no tenderness.  Genitourinary:       Prostate enlarged but non tender and smooth on exam.  Musculoskeletal: He exhibits no edema.  Skin: Skin is warm and dry. He is not diaphoretic.           Assessment & Plan:

## 2010-11-10 NOTE — Patient Instructions (Addendum)
Checking some bloodwork. Referral to Eye doctor for ? Cataracts. Getting results for sleep study and holter monitor from [facility]. Checking urine tests. Flomax for enlarged prostate. Amlodipine for coronary artery spasm.  See handout for places to get this cheaply. Changing metformin to cheaper alternative.  Come back to see me in a month to see how things are going.  -Dr. Karie Schwalbe.

## 2010-11-10 NOTE — Assessment & Plan Note (Signed)
Noncompliant, not taking metformin 2/2 cost. Changed to BID regular release metformin that should be cheaper and covered. Needs to see ophtho, referral done to r/o cataract vs myopia as a cause of his blurry vision. UA/BMET today.

## 2010-11-10 NOTE — Assessment & Plan Note (Signed)
With concurrent obstructive symptoms. Pt also tells me wife has vag discharge. Checking GC/Chlam as well as UA. Prostate large on exam, checking PSA. Starting flomax qHS. Unsure if his nocturia/frequency is because of his BPH or osmotic diuresis from hyperglycemia.  I don't think his glucose is high enough to cause osmotic diuresis so BPH is most likely. Also recommended nighttime voiding before bedtime and avoiding alcohol and fluids qHS. Handout given on BPH.

## 2010-11-11 LAB — GC/CHLAMYDIA PROBE AMP, URINE: GC Probe Amp, Urine: NEGATIVE

## 2010-11-12 ENCOUNTER — Other Ambulatory Visit: Payer: Self-pay

## 2010-11-12 ENCOUNTER — Telehealth: Payer: Self-pay | Admitting: Sports Medicine

## 2010-11-12 ENCOUNTER — Other Ambulatory Visit: Payer: Managed Care, Other (non HMO) | Admitting: Family Medicine

## 2010-11-12 ENCOUNTER — Other Ambulatory Visit: Payer: Self-pay | Admitting: Sports Medicine

## 2010-11-12 ENCOUNTER — Encounter: Payer: Self-pay | Admitting: Sports Medicine

## 2010-11-12 DIAGNOSIS — E119 Type 2 diabetes mellitus without complications: Secondary | ICD-10-CM

## 2010-11-12 LAB — GLUCOSE, CAPILLARY: Glucose-Capillary: 136 mg/dL — ABNORMAL HIGH (ref 70–99)

## 2010-11-12 MED ORDER — RELION CONFIRM GLUCOSE MONITOR W/DEVICE KIT: PACK | Status: DC

## 2010-11-12 MED ORDER — METFORMIN HCL 500 MG PO TABS: ORAL_TABLET | ORAL | Status: DC

## 2010-11-12 NOTE — Telephone Encounter (Signed)
Was seen on 2/22 and was given Metformin.  Last night he started having the shakes/sweating and feeling nauseous.  Not sure if he should come in today or what to do.  Thinks it is from the medicine.

## 2010-11-12 NOTE — Telephone Encounter (Signed)
I saw the pt, he was feeling better, CBG elevated 136.  Advised these are common side effects of metformin.  Will decrease metformin to 500mg  qHS, call in blood glucose meter so he can check his sugar if he feels bad again.  [name] brother has DM2 and he would like his brother to teach him how to use it. Pt to make f/u appt for early march. Sent home in stable condition.

## 2010-11-12 NOTE — Telephone Encounter (Signed)
Dr T wants him to come in to have CBG checked.  May need to adjust meds.  Called patient and he is on his way.

## 2010-11-12 NOTE — Telephone Encounter (Signed)
Patient is on 500 mg of Metformin every am and pm.   This is the first time he has ever taken the medication.  It was prescribed previously but he could not afford it.  Was taking it as prescribed until this am.  Told patient that what he was describing sounded like hypoglycemia.  Asked him if he had eaten anything this am and  he said no, he was too nauseated.  Instructed him to eat some peanut butter and cracker and drink milk now and that I would call him back in about an hour to see if sx have subsided.  I will also notify his PCP in the event the metformin dose needs to be adjusted.

## 2010-11-12 NOTE — Telephone Encounter (Signed)
Patient is feeling somewhat better but would like to be seen.   Will check to see if Dr. Karie Schwalbe can fi him in.

## 2010-11-15 ENCOUNTER — Other Ambulatory Visit: Payer: Self-pay | Admitting: Sports Medicine

## 2010-11-18 ENCOUNTER — Telehealth: Payer: Self-pay | Admitting: Sports Medicine

## 2010-11-18 NOTE — Telephone Encounter (Signed)
appt made December 17, 2010 115 pm for pt at [facility] [address] ph. [phone].  Order faxed to [phone].Laureen Ochs, Viann Shove

## 2010-11-29 LAB — LIPID PANEL
LDL Cholesterol: UNDETERMINED mg/dL (ref 0–99)
VLDL: UNDETERMINED mg/dL (ref 0–40)

## 2010-11-29 LAB — HEPARIN LEVEL (UNFRACTIONATED): Heparin Unfractionated: 0.12 IU/mL — ABNORMAL LOW (ref 0.30–0.70)

## 2010-11-29 LAB — PROTIME-INR: INR: 0.94 (ref 0.00–1.49)

## 2010-11-29 LAB — CARDIAC PANEL(CRET KIN+CKTOT+MB+TROPI)
Relative Index: INVALID (ref 0.0–2.5)
Troponin I: 0.01 ng/mL (ref 0.00–0.06)

## 2010-11-29 LAB — HEMOGLOBIN A1C
Hgb A1c MFr Bld: 6.2 % — ABNORMAL HIGH (ref <5.7)
Mean Plasma Glucose: 131 mg/dL — ABNORMAL HIGH (ref <117)

## 2010-11-29 LAB — CBC
HCT: 42.1 % (ref 39.0–52.0)
MCH: 26.1 pg (ref 26.0–34.0)
MCHC: 33.3 g/dL (ref 30.0–36.0)
MCV: 78.5 fL (ref 78.0–100.0)
Platelets: 214 10*3/uL (ref 150–400)
RDW: 16.7 % — ABNORMAL HIGH (ref 11.5–15.5)
WBC: 6.2 10*3/uL (ref 4.0–10.5)

## 2010-11-29 LAB — COMPREHENSIVE METABOLIC PANEL
Albumin: 3.7 g/dL (ref 3.5–5.2)
Alkaline Phosphatase: 94 U/L (ref 39–117)
BUN: 10 mg/dL (ref 6–23)
Creatinine, Ser: 0.89 mg/dL (ref 0.4–1.5)
Glucose, Bld: 121 mg/dL — ABNORMAL HIGH (ref 70–99)
Potassium: 4 mEq/L (ref 3.5–5.1)
Total Protein: 6.9 g/dL (ref 6.0–8.3)

## 2010-11-29 LAB — BRAIN NATRIURETIC PEPTIDE: Pro B Natriuretic peptide (BNP): <30 pg/mL (ref 0.0–100.0)

## 2010-11-30 LAB — CBC
HCT: 44.8 % (ref 39.0–52.0)
Hemoglobin: 15.8 g/dL (ref 13.0–17.0)
MCH: 27.2 pg (ref 26.0–34.0)
MCHC: 35.3 g/dL (ref 30.0–36.0)
MCV: 77.2 fL — ABNORMAL LOW (ref 78.0–100.0)
RBC: 5.8 MIL/uL (ref 4.22–5.81)

## 2010-11-30 LAB — POCT I-STAT, CHEM 8
BUN: 19 mg/dL (ref 6–23)
Chloride: 107 mEq/L (ref 96–112)
Creatinine, Ser: 0.9 mg/dL (ref 0.4–1.5)
Glucose, Bld: 211 mg/dL — ABNORMAL HIGH (ref 70–99)
Hemoglobin: 15.3 g/dL (ref 13.0–17.0)
Hemoglobin: 16.3 g/dL (ref 13.0–17.0)
Potassium: 3.9 mEq/L (ref 3.5–5.1)
Potassium: 4.3 mEq/L (ref 3.5–5.1)
Sodium: 138 mEq/L (ref 135–145)
Sodium: 138 mEq/L (ref 135–145)
TCO2: 26 mmol/L (ref 0–100)

## 2010-11-30 LAB — DIFFERENTIAL
Basophils Relative: 0 % (ref 0–1)
Lymphocytes Relative: 16 % (ref 12–46)
Lymphs Abs: 1.1 10*3/uL (ref 0.7–4.0)
Monocytes Absolute: 0.1 10*3/uL (ref 0.1–1.0)
Monocytes Relative: 1 % — ABNORMAL LOW (ref 3–12)
Neutro Abs: 5.6 10*3/uL (ref 1.7–7.7)
Neutrophils Relative %: 82 % — ABNORMAL HIGH (ref 43–77)

## 2010-11-30 LAB — MRSA PCR SCREENING: MRSA by PCR: NEGATIVE

## 2010-11-30 LAB — CARDIAC PANEL(CRET KIN+CKTOT+MB+TROPI)
CK, MB: 1 ng/mL (ref 0.3–4.0)
CK, MB: 1.4 ng/mL (ref 0.3–4.0)
Total CK: 79 U/L (ref 7–232)
Troponin I: 0.01 ng/mL (ref 0.00–0.06)

## 2010-11-30 LAB — POCT CARDIAC MARKERS
CKMB, poc: 1.1 ng/mL (ref 1.0–8.0)
CKMB, poc: <1 ng/mL — ABNORMAL LOW (ref 1.0–8.0)
Myoglobin, poc: 39.2 ng/mL (ref 12–200)
Myoglobin, poc: 41.3 ng/mL (ref 12–200)

## 2010-11-30 LAB — CK TOTAL AND CKMB (NOT AT ARMC): Total CK: 118 U/L (ref 7–232)

## 2010-11-30 LAB — HEPARIN LEVEL (UNFRACTIONATED): Heparin Unfractionated: <0.1 IU/mL — ABNORMAL LOW (ref 0.30–0.70)

## 2010-12-13 LAB — DIFFERENTIAL
Basophils Absolute: 0 10*3/uL (ref 0.0–0.1)
Basophils Relative: 0 % (ref 0–1)
Eosinophils Absolute: 0.1 10*3/uL (ref 0.0–0.7)
Eosinophils Relative: 1 % (ref 0–5)
Lymphocytes Relative: 15 % (ref 12–46)
Lymphs Abs: 1.2 10*3/uL (ref 0.7–4.0)
Monocytes Absolute: 0.4 K/uL (ref 0.1–1.0)
Monocytes Relative: 5 % (ref 3–12)
Neutro Abs: 6.1 10*3/uL (ref 1.7–7.7)
Neutrophils Relative %: 79 % — ABNORMAL HIGH (ref 43–77)

## 2010-12-13 LAB — POCT CARDIAC MARKERS
CKMB, poc: 1.6 ng/mL (ref 1.0–8.0)
Troponin i, poc: 0.06 ng/mL (ref 0.00–0.09)

## 2010-12-13 LAB — CBC
HCT: 43.8 % (ref 39.0–52.0)
Hemoglobin: 14.8 g/dL (ref 13.0–17.0)
MCHC: 33.8 g/dL (ref 30.0–36.0)
MCV: 80.6 fL (ref 78.0–100.0)
Platelets: 251 10*3/uL (ref 150–400)
RBC: 5.44 MIL/uL (ref 4.22–5.81)
RDW: 17.4 % — ABNORMAL HIGH (ref 11.5–15.5)
WBC: 7.7 10*3/uL (ref 4.0–10.5)

## 2010-12-13 LAB — BASIC METABOLIC PANEL WITH GFR
CO2: 25 meq/L (ref 19–32)
Chloride: 102 meq/L (ref 96–112)
Creatinine, Ser: 0.77 mg/dL (ref 0.4–1.5)
GFR calc Af Amer: >60 mL/min (ref >60)
Potassium: 6 meq/L — ABNORMAL HIGH (ref 3.5–5.1)

## 2010-12-13 LAB — BASIC METABOLIC PANEL
BUN: 21 mg/dL (ref 6–23)
Calcium: 8.8 mg/dL (ref 8.4–10.5)
GFR calc non Af Amer: >60 mL/min (ref >60)
Glucose, Bld: 151 mg/dL — ABNORMAL HIGH (ref 70–99)
Sodium: 134 mEq/L — ABNORMAL LOW (ref 135–145)

## 2010-12-13 LAB — POTASSIUM: Potassium: 3.9 meq/L (ref 3.5–5.1)

## 2010-12-17 ENCOUNTER — Ambulatory Visit: Payer: Managed Care, Other (non HMO) | Admitting: Sports Medicine

## 2010-12-17 ENCOUNTER — Encounter: Payer: Self-pay | Admitting: Family Medicine

## 2010-12-17 ENCOUNTER — Ambulatory Visit (INDEPENDENT_AMBULATORY_CARE_PROVIDER_SITE_OTHER): Payer: Managed Care, Other (non HMO) | Admitting: Family Medicine

## 2010-12-17 DIAGNOSIS — J45909 Unspecified asthma, uncomplicated: Secondary | ICD-10-CM

## 2010-12-17 DIAGNOSIS — R112 Nausea with vomiting, unspecified: Secondary | ICD-10-CM | POA: Insufficient documentation

## 2010-12-17 DIAGNOSIS — R197 Diarrhea, unspecified: Secondary | ICD-10-CM | POA: Insufficient documentation

## 2010-12-17 DIAGNOSIS — E119 Type 2 diabetes mellitus without complications: Secondary | ICD-10-CM

## 2010-12-17 MED ORDER — ALBUTEROL SULFATE HFA 108 (90 BASE) MCG/ACT IN AERS: 2.0000 | INHALATION_SPRAY | RESPIRATORY_TRACT | Status: DC | PRN

## 2010-12-17 MED ORDER — ONDANSETRON 8 MG PO TBDP: 8.0000 mg | ORAL_TABLET | Freq: Three times a day (TID) | ORAL | Status: DC | PRN

## 2010-12-17 MED ORDER — BREATHERITE COLL SPACER ADULT MISC: Status: DC

## 2010-12-17 MED ORDER — FLUTICASONE PROPIONATE HFA 220 MCG/ACT IN AERO: 2.0000 | INHALATION_SPRAY | Freq: Two times a day (BID) | RESPIRATORY_TRACT | Status: DC

## 2010-12-17 MED ORDER — PREDNISONE 20 MG PO TABS: 40.0000 mg | ORAL_TABLET | Freq: Every day | ORAL | Status: DC

## 2010-12-17 NOTE — Assessment & Plan Note (Addendum)
Poorly controlled, not on any controller medications due to high cost of advair and singulair.  Will change advair to flovent high dose.  Advised to take zyrtec daily.  Wheezing today, will prescribed prednisone x 5 days, will follow-up on Monday.

## 2010-12-17 NOTE — Progress Notes (Signed)
  Subjective:    Patient ID: Cody Raymond, male    DOB: Dec 29, 1970, 40 y.o.   MRN: [id number]  HPIWork in appt:  N/V/D: 2 days history of nausea, emesis, chills.  States onset after went to "back doctor" on Wednesday and got a new pain medication.  He has since stopped the pain medication.  Continues diarrhea without blood.  4 times this morning, emesis 4x this morning. Relieved with some OTC meclizine.    Asthma:  Has had worsening cough, dyspnea.  using albuterol every 4 hours.   Has not been taking Advair as the copay is $40 and for Singulair is $70 with his insurance.  States spring is difficult season.    Review of Systems  Constitutional: Positive for chills. Negative for fever and fatigue.  HENT: Negative for congestion, sore throat and rhinorrhea.   Respiratory: Positive for cough and shortness of breath.   Gastrointestinal: Positive for nausea, vomiting, abdominal pain and diarrhea.  Genitourinary: Positive for dysuria.  Neurological: Positive for dizziness.       Objective:   Physical Exam  Constitutional: He is oriented to person, place, and time. He appears well-developed and well-nourished.  HENT:  Head: Normocephalic and atraumatic.  Right Ear: External ear normal.  Left Ear: External ear normal.  Mouth/Throat: Oropharynx is clear and moist.  Eyes: Conjunctivae are normal. Pupils are equal, round, and reactive to light.  Neck: Neck supple. No thyromegaly present.  Cardiovascular: Normal rate and regular rhythm.   No murmur heard. Pulmonary/Chest: Effort normal. No respiratory distress. He has wheezes. He has no rales.  Abdominal: Soft. He exhibits no distension and no mass. There is tenderness. There is no rebound and no guarding.  Lymphadenopathy:    He has no cervical adenopathy.  Neurological: He is alert and oriented to person, place, and time.  Skin: No rash noted.          Assessment & Plan:

## 2010-12-17 NOTE — Assessment & Plan Note (Addendum)
Do not think likely med reaction with continued symptoms.  Now 2 days of symptoms, likely timing of viral gastroenteritis.  Well hydrated.  Spot CBG ok now, patient states has beenin 120's-130's.   Advised continued oral hydration, given zofran.  Abd pain mild.  Will recheck in 3 days, If continues, consider gastroparesis, diverticulitis in differential.  Has not had CT abd or motility study in E-chart.

## 2010-12-17 NOTE — Patient Instructions (Signed)
Will change your advair to flovent which is generic and should be cheaper.  It is important to take this every day to prevent worsening of your asthma. I think you have a stomach bug and that is the cause of your diarrhea and emesis. Drink plenty of water to stay hydrated.Take your zyrtec every day. Will start prednisone for 5 days to help calm your asthma down. Make appointment to be seen on Monday for a recheck.

## 2010-12-20 ENCOUNTER — Ambulatory Visit (INDEPENDENT_AMBULATORY_CARE_PROVIDER_SITE_OTHER): Payer: Managed Care, Other (non HMO) | Admitting: Sports Medicine

## 2010-12-20 ENCOUNTER — Encounter: Payer: Self-pay | Admitting: Sports Medicine

## 2010-12-20 VITALS — BP 120/85 | HR 101 | Temp 98.2°F | Ht 68.0 in | Wt 213.5 lb

## 2010-12-20 DIAGNOSIS — E781 Pure hyperglyceridemia: Secondary | ICD-10-CM

## 2010-12-20 DIAGNOSIS — E785 Hyperlipidemia, unspecified: Secondary | ICD-10-CM

## 2010-12-20 DIAGNOSIS — N401 Enlarged prostate with lower urinary tract symptoms: Secondary | ICD-10-CM | POA: Insufficient documentation

## 2010-12-20 DIAGNOSIS — R351 Nocturia: Secondary | ICD-10-CM

## 2010-12-20 DIAGNOSIS — E119 Type 2 diabetes mellitus without complications: Secondary | ICD-10-CM

## 2010-12-20 LAB — LIPID PANEL
Cholesterol: 197 mg/dL (ref 0–200)
HDL: 32 mg/dL — ABNORMAL LOW (ref >39)
Total CHOL/HDL Ratio: 6.2 Ratio
Triglycerides: 543 mg/dL — ABNORMAL HIGH (ref <150)

## 2010-12-20 LAB — POCT GLYCOSYLATED HEMOGLOBIN (HGB A1C): Hemoglobin A1C: 5.5

## 2010-12-20 MED ORDER — FINASTERIDE 5 MG PO TABS: 5.0000 mg | ORAL_TABLET | Freq: Every day | ORAL | Status: AC

## 2010-12-20 MED ORDER — CLOTRIMAZOLE-BETAMETHASONE 1-0.05 % EX CREA: TOPICAL_CREAM | CUTANEOUS | Status: DC

## 2010-12-20 NOTE — Assessment & Plan Note (Addendum)
Last A1c controlled. Doing well with metformin 500 qHS. Rechecking today. Has developed groin fungal infection, lotrisone. ....A1c in the 5's, will switch to QOD metformin dosing.

## 2010-12-20 NOTE — Patient Instructions (Signed)
Checking A1c and lipids. Lotrisone called in. Stop Flomax and start finasteride, this takes months to work. Letter written. Come back to see me if the lightheadedness doesn't improve with stopping the flomax.  Ihor Austin. Benjamin Stain, M.D.

## 2010-12-20 NOTE — Assessment & Plan Note (Signed)
Suspected BPH Lightheaded with flomax. Stopping this and starting finasteride.

## 2010-12-20 NOTE — Progress Notes (Signed)
  Subjective:    Patient ID: Cody Raymond, male    DOB: [birth year], 40 y.o.   MRN: [id number]  HPI Lightheadedness:  Worse with standing and after starting flomax, though he did note an improvement in his voiding.  No CP/SOB/palpitations.  DM2:  Under good control on meformin.   Review of Systems    See HPI Objective:   Physical Exam  Constitutional: He appears well-developed and well-nourished. No distress.  Cardiovascular: Normal rate and normal heart sounds.  Exam reveals no gallop and no friction rub.   No murmur heard. Pulmonary/Chest: Effort normal. No respiratory distress. He has no wheezes. He has no rales. He exhibits no tenderness.  Skin: Skin is warm and dry.          Assessment & Plan:

## 2010-12-20 NOTE — Progress Notes (Signed)
Addended by: Rodney Langton on: 12/20/2010 09:57 PM   Modules accepted: Orders

## 2010-12-20 NOTE — Assessment & Plan Note (Addendum)
Rechecking lipid panel. On statin and fibrate. Add niacin if still high. ..triglycerides in the 500's, adding Niacin 500 qHS x 4wk, then incr to 1000 qHS

## 2010-12-21 ENCOUNTER — Telehealth: Payer: Self-pay | Admitting: *Deleted

## 2010-12-21 MED ORDER — NIACIN ER 500 MG PO CPCR: ORAL_CAPSULE | ORAL | Status: AC

## 2010-12-21 MED ORDER — NIACIN ER 1000 MG PO TBCR: EXTENDED_RELEASE_TABLET | ORAL | Status: DC

## 2010-12-21 NOTE — Telephone Encounter (Signed)
Spoke to pt and informed him that Dr. Benjamin Stain would like for him to start Niacin 500 mg po at qhs for 4 wks and then start Niacin 1000 mg po qhs. Also told pt to cut down/out fatty foods and to eat more veggies and try to exercise. Pt understood.Loralee Pacas [place]

## 2010-12-21 NOTE — Telephone Encounter (Signed)
Called and informed pt that Dr. Benjamin Stain wants him to start taking his metformin every other day. Pt understood and agreed.Loralee Pacas [place]

## 2010-12-21 NOTE — Progress Notes (Signed)
Addended by: Rodney Langton on: 12/21/2010 02:14 PM   Modules accepted: Orders

## 2011-01-28 ENCOUNTER — Ambulatory Visit (INDEPENDENT_AMBULATORY_CARE_PROVIDER_SITE_OTHER): Payer: Managed Care, Other (non HMO) | Admitting: Family Medicine

## 2011-01-28 ENCOUNTER — Encounter: Payer: Self-pay | Admitting: Family Medicine

## 2011-01-28 VITALS — BP 121/76 | HR 96 | Temp 98.3°F | Ht 68.0 in | Wt 215.0 lb

## 2011-01-28 DIAGNOSIS — J45909 Unspecified asthma, uncomplicated: Secondary | ICD-10-CM

## 2011-01-28 MED ORDER — PREDNISONE 20 MG PO TABS: 60.0000 mg | ORAL_TABLET | Freq: Every day | ORAL | Status: AC

## 2011-01-28 MED ORDER — PREDNISONE 20 MG PO TABS: 20.0000 mg | ORAL_TABLET | Freq: Every day | ORAL | Status: DC

## 2011-01-28 MED ORDER — IPRATROPIUM BROMIDE 0.02 % IN SOLN
0.5000 mg | Freq: Once | RESPIRATORY_TRACT | Status: AC
Start: 2011-01-28 — End: 2011-01-28
  Administered 2011-01-28: 0.5 mg via RESPIRATORY_TRACT

## 2011-01-28 MED ORDER — ALBUTEROL SULFATE (2.5 MG/3ML) 0.083% IN NEBU
2.5000 mg | INHALATION_SOLUTION | Freq: Once | RESPIRATORY_TRACT | Status: AC
  Administered 2011-01-28: 2.5 mg via RESPIRATORY_TRACT

## 2011-01-28 NOTE — Patient Instructions (Signed)
Use your daily inhalers.  Take the prednisone as directed. Follow up with Dr. Karie Schwalbe in one month. If you are feeling worse give [place] a call.   - Dr. Wallene Huh

## 2011-01-28 NOTE — Assessment & Plan Note (Addendum)
Acute exacerbation today, likely secondary to viral process. Exam and peak flows somewhat improved with Albuterol / Atrovent nebulizer x 1. Will start course oral steroids x 7 days - care with diabetes - advised patient to follow his sugars closely. Advised regarding red flags that would prompt return to care. Peak flow after treatment ~ 70% predicted. Continue medications as above. Follow with PCP in one month.

## 2011-01-28 NOTE — Progress Notes (Signed)
  Subjective:    Patient ID: Cody Raymond, male    DOB: [birth year], 40 y.o.   MRN: [id number]  Asthma He complains of chest tightness, cough, difficulty breathing, frequent throat clearing, shortness of breath, sputum production and wheezing. There is no hemoptysis or hoarse voice. This is a new problem. Episode onset: 1 week ago  The problem occurs intermittently. The problem has been unchanged. The cough is productive of sputum, productive, hoarse and nocturnal. Associated symptoms include chest pain, dyspnea on exertion, ear congestion, a fever, malaise/fatigue, nasal congestion, postnasal drip and rhinorrhea. Pertinent negatives include no appetite change, ear pain, headaches, heartburn, myalgias, orthopnea, PND, sneezing, sore throat, sweats, trouble swallowing or weight loss. His symptoms are aggravated by any activity. Relieved by: partial relief with home albuterol nebulizer; no relief with mucolytics. He reports minimal improvement on treatment. His symptoms are not alleviated by OTC cough suppressant. Risk factors for lung disease include no known risk factors. His past medical history is significant for asthma.  Note that patient has history of diabetes mellitus type 2, last A1C 6.2 in December 2011.     Review of Systems  Constitutional: Positive for fever and malaise/fatigue. Negative for weight loss and appetite change.  HENT: Positive for rhinorrhea and postnasal drip. Negative for ear pain, sore throat, hoarse voice, sneezing and trouble swallowing.   Respiratory: Positive for cough, sputum production, shortness of breath and wheezing. Negative for hemoptysis.   Cardiovascular: Positive for chest pain and dyspnea on exertion. Negative for PND.  Gastrointestinal: Negative for heartburn.  Musculoskeletal: Negative for myalgias.  Neurological: Negative for headaches.    Pertinent past medical history reviewed.      Objective:   Physical Exam  Constitutional: He is oriented to  person, place, and time. He appears well-developed and well-nourished. No distress.  HENT:  Right Ear: Tympanic membrane, external ear and ear canal normal. No drainage.  Left Ear: Tympanic membrane, external ear and ear canal normal. No drainage.  Nose: Rhinorrhea present. No mucosal edema. Right sinus exhibits no maxillary sinus tenderness and no frontal sinus tenderness. Left sinus exhibits no maxillary sinus tenderness and no frontal sinus tenderness.  Mouth/Throat: Uvula is midline, oropharynx is clear and moist and mucous membranes are normal. No oropharyngeal exudate, posterior oropharyngeal edema or posterior oropharyngeal erythema.  Eyes: Conjunctivae are normal. Right eye exhibits no discharge and no exudate. Left eye exhibits no discharge and no exudate. Right conjunctiva is not injected. Left conjunctiva is not injected.  Cardiovascular: Normal rate, S1 normal, S2 normal and normal pulses.  A regularly irregular rhythm present.  No murmur heard. Pulmonary/Chest: No accessory muscle usage or stridor. Not tachypneic. He has wheezes in the right upper field, the right middle field, the right lower field, the left upper field, the left middle field and the left lower field. He has no rhonchi. He has no rales.       Peak flows before neb - 270, 250, 280 Post treatment 330, 350, 330 Predicted = 500  Early expiratory wheeze  Inspiratory : Expiratory ratio 1:2 before treatment; 1:1 after treatment   Neurological: He is alert and oriented to person, place, and time. He is not disoriented.  Skin: Skin is warm and dry. He is not diaphoretic. No cyanosis. No pallor.          Assessment & Plan:

## 2011-02-17 ENCOUNTER — Encounter: Payer: Self-pay | Admitting: Sports Medicine

## 2011-02-17 ENCOUNTER — Ambulatory Visit (INDEPENDENT_AMBULATORY_CARE_PROVIDER_SITE_OTHER): Payer: Managed Care, Other (non HMO) | Admitting: Sports Medicine

## 2011-02-17 ENCOUNTER — Ambulatory Visit
Admission: RE | Admit: 2011-02-17 | Discharge: 2011-02-17 | Disposition: A | Discharge status: Home / Self Care | Payer: Managed Care, Other (non HMO) | Source: Ambulatory Visit | Attending: Family Medicine | Admitting: Family Medicine

## 2011-02-17 VITALS — BP 123/88 | HR 94 | Temp 97.0°F | Ht 68.0 in | Wt 218.8 lb

## 2011-02-17 DIAGNOSIS — B349 Viral infection, unspecified: Secondary | ICD-10-CM

## 2011-02-17 DIAGNOSIS — R05 Cough: Secondary | ICD-10-CM | POA: Insufficient documentation

## 2011-02-17 DIAGNOSIS — M549 Dorsalgia, unspecified: Secondary | ICD-10-CM

## 2011-02-17 DIAGNOSIS — B9789 Other viral agents as the cause of diseases classified elsewhere: Secondary | ICD-10-CM

## 2011-02-17 DIAGNOSIS — R059 Cough, unspecified: Secondary | ICD-10-CM | POA: Insufficient documentation

## 2011-02-17 LAB — CBC
HCT: 43.3 % (ref 39.0–52.0)
MCHC: 33 g/dL (ref 30.0–36.0)
RDW: 17.6 % — ABNORMAL HIGH (ref 11.5–15.5)

## 2011-02-17 LAB — COMPREHENSIVE METABOLIC PANEL
AST: 20 U/L (ref 0–37)
Alkaline Phosphatase: 105 U/L (ref 39–117)
BUN: 17 mg/dL (ref 6–23)
Creat: 0.86 mg/dL (ref 0.40–1.50)
Potassium: 3.9 mEq/L (ref 3.5–5.3)

## 2011-02-17 MED ORDER — ALBUTEROL SULFATE (2.5 MG/3ML) 0.083% IN NEBU
2.5000 mg | INHALATION_SOLUTION | Freq: Once | RESPIRATORY_TRACT | Status: AC
  Administered 2011-02-17: 2.5 mg via RESPIRATORY_TRACT

## 2011-02-17 MED ORDER — IPRATROPIUM BROMIDE 0.02 % IN SOLN
0.5000 mg | Freq: Once | RESPIRATORY_TRACT | Status: AC
  Administered 2011-02-17: 0.5 mg via RESPIRATORY_TRACT

## 2011-02-17 NOTE — Patient Instructions (Signed)
Great to see you, Breathing treatment in office. Get your bloodwork and chest xray. Use theraflu as directed   Come back to see me if no better in 1-2 weeks.  Cody Raymond. Cody Raymond, M.D.

## 2011-02-17 NOTE — Progress Notes (Signed)
Addended by: Jimmy Footman K on: 02/17/2011 11:39 AM   Modules accepted: Orders

## 2011-02-17 NOTE — Progress Notes (Signed)
  Subjective:    Patient ID: Cody Raymond, male    DOB: [birth year], 40 y.o.   MRN: [id number]  HPI Pt comes in with multiple complaints.  Worst for him today is 1 week of diarrhea (NB), muscle pain in all muscles, dizziness, cough, phlegm production, irritability at home, wheezing.  No fevers/chills/N/V, rashes, abd pain.  No sick contacts.   Review of Systems    See HPI Objective:   Physical Exam  Constitutional: He appears well-developed and well-nourished. No distress.  HENT:  Head: Normocephalic and atraumatic.  Right Ear: External ear normal.  Left Ear: External ear normal.  Nose: Nose normal.  Mouth/Throat: No oropharyngeal exudate.  Eyes: Pupils are equal, round, and reactive to light. Right eye exhibits no discharge. Left eye exhibits no discharge.  Neck: Normal range of motion. Neck supple. No JVD present. No tracheal deviation present. No thyromegaly present.  Cardiovascular: Normal rate, normal heart sounds and intact distal pulses.  Exam reveals no gallop and no friction rub.   No murmur heard. Pulmonary/Chest: Effort normal. No stridor.       Some wheeze but unclear if this is coming from upper airway, no stridor, no rhonchi.  Speaking full sentences  Abdominal: Soft. He exhibits no distension. There is no tenderness. There is no rebound.  Musculoskeletal: He exhibits no edema.  Lymphadenopathy:    He has no cervical adenopathy.  Skin: Skin is warm and dry. No rash noted. He is not diaphoretic.          Assessment & Plan:

## 2011-02-17 NOTE — Assessment & Plan Note (Addendum)
Suspect viral syndrome vs LRTI Alb/atrovent in office. Theraflu day/night at home. Checking CBC, CMET, ESR CXR with lower respiratory sx/signs. RTC if no better 2 weeks.

## 2011-02-17 NOTE — Assessment & Plan Note (Signed)
Of note (not assessed during this visit), pt had MRI at [facility], bulging disk with foraminal impingement. Will be having steroid injections.

## 2011-02-18 ENCOUNTER — Telehealth: Payer: Self-pay | Admitting: *Deleted

## 2011-02-18 NOTE — Telephone Encounter (Signed)
Spoke with patient and informed of below 

## 2011-02-18 NOTE — Telephone Encounter (Signed)
Message copied by Farrell Ours on Fri Feb 18, 2011  2:26 PM ------      Message from: Monica Becton      Created: Fri Feb 18, 2011  8:34 AM       Pls let leo know all lab results and CXR were negative.  Symptoms most likely a viral illness and will pass.  Should use theraflu as we discussed.            Ihor Austin. Benjamin Stain, M.D.

## 2011-02-20 ENCOUNTER — Telehealth: Payer: Self-pay | Admitting: Family Medicine

## 2011-02-20 NOTE — Telephone Encounter (Signed)
Pt states that he is still having the light headiness and nausea and vomiting pt complained of since Friday.  Does not have a fever and does not think it is the flu.  Pt states that he has gotten some new medications from Dr. Marianna Payment  (not in the chart) and one is hydrocdone when asked if there is association with it then pt states yes.  Told pt would try to substitute motrin or tylenol instead and see if it would help, unable to prescribe over the phone.  Gave pt red flags to look out for and if needed to come to emergency room, instead pt would like to wait til Monday and call for an appointment.

## 2011-02-21 ENCOUNTER — Emergency Department (HOSPITAL_COMMUNITY)
Admission: EM | Admit: 2011-02-21 | Discharge: 2011-02-21 | Disposition: A | Discharge status: Home / Self Care | Payer: Managed Care, Other (non HMO) | Attending: Emergency Medicine | Admitting: Emergency Medicine

## 2011-02-21 ENCOUNTER — Emergency Department (HOSPITAL_COMMUNITY): Payer: Managed Care, Other (non HMO)

## 2011-02-21 DIAGNOSIS — E860 Dehydration: Secondary | ICD-10-CM | POA: Insufficient documentation

## 2011-02-21 DIAGNOSIS — R079 Chest pain, unspecified: Secondary | ICD-10-CM | POA: Insufficient documentation

## 2011-02-21 DIAGNOSIS — E119 Type 2 diabetes mellitus without complications: Secondary | ICD-10-CM | POA: Insufficient documentation

## 2011-02-21 DIAGNOSIS — R109 Unspecified abdominal pain: Secondary | ICD-10-CM | POA: Insufficient documentation

## 2011-02-21 DIAGNOSIS — R112 Nausea with vomiting, unspecified: Secondary | ICD-10-CM | POA: Insufficient documentation

## 2011-02-21 DIAGNOSIS — E669 Obesity, unspecified: Secondary | ICD-10-CM | POA: Insufficient documentation

## 2011-02-21 DIAGNOSIS — J45909 Unspecified asthma, uncomplicated: Secondary | ICD-10-CM | POA: Insufficient documentation

## 2011-02-21 DIAGNOSIS — R05 Cough: Secondary | ICD-10-CM | POA: Insufficient documentation

## 2011-02-21 DIAGNOSIS — Z79899 Other long term (current) drug therapy: Secondary | ICD-10-CM | POA: Insufficient documentation

## 2011-02-21 DIAGNOSIS — IMO0001 Reserved for inherently not codable concepts without codable children: Secondary | ICD-10-CM | POA: Insufficient documentation

## 2011-02-21 DIAGNOSIS — R059 Cough, unspecified: Secondary | ICD-10-CM | POA: Insufficient documentation

## 2011-02-21 DIAGNOSIS — E785 Hyperlipidemia, unspecified: Secondary | ICD-10-CM | POA: Insufficient documentation

## 2011-02-21 DIAGNOSIS — I10 Essential (primary) hypertension: Secondary | ICD-10-CM | POA: Insufficient documentation

## 2011-02-21 LAB — COMPREHENSIVE METABOLIC PANEL
AST: 22 U/L (ref 0–37)
BUN: 14 mg/dL (ref 6–23)
CO2: 25 mEq/L (ref 19–32)
Chloride: 100 mEq/L (ref 96–112)
Creatinine, Ser: 0.69 mg/dL (ref 0.4–1.5)
GFR calc non Af Amer: >60 mL/min (ref >60)
Total Bilirubin: 0.6 mg/dL (ref 0.3–1.2)

## 2011-02-21 LAB — DIFFERENTIAL
Basophils Absolute: 0 10*3/uL (ref 0.0–0.1)
Basophils Relative: 0 % (ref 0–1)
Eosinophils Relative: 1 % (ref 0–5)
Monocytes Absolute: 0.4 10*3/uL (ref 0.1–1.0)
Neutro Abs: 4.8 10*3/uL (ref 1.7–7.7)

## 2011-02-21 LAB — CBC
Hemoglobin: 14.4 g/dL (ref 13.0–17.0)
MCHC: 35.9 g/dL (ref 30.0–36.0)
RDW: 16.3 % — ABNORMAL HIGH (ref 11.5–15.5)

## 2011-02-21 LAB — URINALYSIS, ROUTINE W REFLEX MICROSCOPIC
Nitrite: NEGATIVE
Protein, ur: NEGATIVE mg/dL
Urobilinogen, UA: 0.2 mg/dL (ref 0.0–1.0)

## 2011-02-21 LAB — CK TOTAL AND CKMB (NOT AT ARMC): Relative Index: INVALID (ref 0.0–2.5)

## 2011-02-22 ENCOUNTER — Ambulatory Visit (INDEPENDENT_AMBULATORY_CARE_PROVIDER_SITE_OTHER): Payer: Managed Care, Other (non HMO) | Admitting: Internal Medicine

## 2011-02-22 ENCOUNTER — Encounter: Payer: Self-pay | Admitting: Internal Medicine

## 2011-02-22 DIAGNOSIS — I201 Angina pectoris with documented spasm: Secondary | ICD-10-CM

## 2011-02-22 DIAGNOSIS — E119 Type 2 diabetes mellitus without complications: Secondary | ICD-10-CM

## 2011-02-22 DIAGNOSIS — J45909 Unspecified asthma, uncomplicated: Secondary | ICD-10-CM

## 2011-02-22 DIAGNOSIS — I209 Angina pectoris, unspecified: Secondary | ICD-10-CM

## 2011-02-22 DIAGNOSIS — R112 Nausea with vomiting, unspecified: Secondary | ICD-10-CM

## 2011-02-22 MED ORDER — ALBUTEROL SULFATE HFA 108 (90 BASE) MCG/ACT IN AERS: 2.0000 | INHALATION_SPRAY | RESPIRATORY_TRACT | Status: DC | PRN

## 2011-02-22 MED ORDER — NITROGLYCERIN 0.4 MG SL SUBL: 0.4000 mg | SUBLINGUAL_TABLET | SUBLINGUAL | Status: DC | PRN

## 2011-02-22 MED ORDER — BECLOMETHASONE DIPROPIONATE 80 MCG/ACT IN AERS: 2.0000 | INHALATION_SPRAY | Freq: Two times a day (BID) | RESPIRATORY_TRACT | Status: DC

## 2011-02-22 NOTE — Progress Notes (Signed)
  Subjective:    Patient ID: Marcin Holte, male    DOB: 08/27/1971, 40 y.o.   MRN: [id number]  HPI Pt presents to clinic for evaluation of malaise and dizziness. Notes several month h/o generalized malaise, dizziness, nausea and unintended wt loss dating back to dx of DM and institution of metformin. FSBS generally representing good control with majority of readings low 100's without hypoglycemia. Dose apparently decreased to current 500mg  po QOD after complaints of shakiness. Pt was under impression he is type I diabetic however has never required insulin and chart review indicates type II. Seen recently at local ED with similar complaints thought to be possible viral illness complicated by volume depletion. Received IVF with some improvement. Glucose during ED visit 108 and 115.   H/o asthma with intermittent recent wheezing without respiratory distress. Admits financial difficulty affording multiple medications. Currently using albuterol mdi frequently and qvar 80 one puff qd with appropriate mouth rinsing.   Recalls episode of CP enroute to ED via EMS which resolved with SL NTG x one. 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  Does not have NTG SL prn.  No other current complaints.  Reviewed pmh, psh, medications, allergies, social hx and family hx    Review of Systems  Constitutional: Positive for fatigue and unexpected weight change. Negative for fever and chills.  HENT: Negative for hearing loss, congestion and rhinorrhea.   Eyes: Negative for pain, discharge and redness.  Respiratory: Positive for shortness of breath and wheezing. Negative for cough.   Cardiovascular: Positive for chest pain. Negative for palpitations.  Gastrointestinal: Positive for nausea. Negative for abdominal pain, diarrhea and blood in stool.  Genitourinary: Positive for frequency. Negative for urgency and hematuria.  Musculoskeletal: Negative for back pain and  arthralgias.  Skin: Negative for color change, pallor and rash.  Neurological: Positive for dizziness and light-headedness. Negative for seizures, syncope and weakness.  Hematological: Negative for adenopathy. Does not bruise/bleed easily.  Psychiatric/Behavioral: Negative for agitation. The patient is not nervous/anxious.        Objective:   Physical Exam  [nursing notereviewed. Constitutional: He appears well-developed and well-nourished. No distress.  HENT:  Head: Normocephalic and atraumatic.  Right Ear: External ear normal.  Left Ear: External ear normal.  Nose: Nose normal.  Mouth/Throat: Oropharynx is clear and moist. No oropharyngeal exudate.  Eyes: Conjunctivae are normal. Right eye exhibits no discharge. Left eye exhibits no discharge. No scleral icterus.  Neck: Neck supple.  Cardiovascular: Normal rate, regular rhythm and normal heart sounds.  Exam reveals no gallop and no friction rub.   No murmur heard. Pulmonary/Chest: Effort normal. No respiratory distress. He has wheezes. He has no rales.  Abdominal: Soft. Bowel sounds are normal. He exhibits no distension. There is no tenderness.  Lymphadenopathy:    He has no cervical adenopathy.  Neurological: He is alert.  Skin: Skin is warm and dry. He is not diaphoretic.  Psychiatric: He has a normal mood and affect.          Assessment & Plan:

## 2011-02-22 NOTE — Assessment & Plan Note (Signed)
Currently asx. Recent cardiac enzymes neg. NTG SL prn prescription provided.

## 2011-02-22 NOTE — Assessment & Plan Note (Signed)
Hold metformin (and niaspan) as most recent medications associated with onset of sx's. Monitor fsbs carefully. Followup one week or sooner if necessary. Work note provided 6/6 and 02/24/11.

## 2011-02-22 NOTE — Assessment & Plan Note (Signed)
Suboptimal control. RF albuterol mdi prn. Increase qvar 2 puffs bid. Close followup scheduled.

## 2011-02-23 ENCOUNTER — Ambulatory Visit: Payer: Managed Care, Other (non HMO) | Admitting: Sports Medicine

## 2011-02-24 ENCOUNTER — Telehealth: Payer: Self-pay | Admitting: *Deleted

## 2011-02-24 NOTE — Telephone Encounter (Signed)
Pt is no better after stopping the two meds Dr. Ty Hilts instructed him to .  He made appt to come back tomorrow.

## 2011-02-25 ENCOUNTER — Ambulatory Visit: Payer: Managed Care, Other (non HMO) | Admitting: Internal Medicine

## 2011-03-01 ENCOUNTER — Encounter: Payer: Self-pay | Admitting: Internal Medicine

## 2011-03-01 ENCOUNTER — Ambulatory Visit (INDEPENDENT_AMBULATORY_CARE_PROVIDER_SITE_OTHER): Payer: Managed Care, Other (non HMO) | Admitting: Internal Medicine

## 2011-03-01 ENCOUNTER — Other Ambulatory Visit: Payer: Self-pay | Admitting: Sports Medicine

## 2011-03-01 DIAGNOSIS — R109 Unspecified abdominal pain: Secondary | ICD-10-CM | POA: Insufficient documentation

## 2011-03-01 DIAGNOSIS — R369 Urethral discharge, unspecified: Secondary | ICD-10-CM | POA: Insufficient documentation

## 2011-03-01 DIAGNOSIS — I201 Angina pectoris with documented spasm: Secondary | ICD-10-CM

## 2011-03-01 DIAGNOSIS — R002 Palpitations: Secondary | ICD-10-CM | POA: Insufficient documentation

## 2011-03-01 DIAGNOSIS — R300 Dysuria: Secondary | ICD-10-CM

## 2011-03-01 DIAGNOSIS — E119 Type 2 diabetes mellitus without complications: Secondary | ICD-10-CM

## 2011-03-01 LAB — POCT URINALYSIS DIPSTICK
Blood, UA: NEGATIVE
Glucose, UA: NEGATIVE
Nitrite, UA: NEGATIVE
Urobilinogen, UA: 0.2

## 2011-03-01 MED ORDER — DOXYCYCLINE HYCLATE 100 MG PO TABS: 100.0000 mg | ORAL_TABLET | Freq: Two times a day (BID) | ORAL | Status: DC

## 2011-03-01 NOTE — Assessment & Plan Note (Signed)
Obtain urinalysis as well as urine LCR for gonorrhea and Chlamydia. Begin empiric doxycycline for possible non-GC urethritis.Followup if no improvement or worsening.

## 2011-03-01 NOTE — Assessment & Plan Note (Signed)
Currently symptomatic. EKG obtained demonstrates normal sinus rhythm with normal intervals and axis. No evidence of arrhythmia.

## 2011-03-01 NOTE — Assessment & Plan Note (Signed)
With associated nausea and reported weight loss. No improvement off metformin and Niaspan. Continue PPI therapy. Hold NSAID. GI consult will be scheduled. Work note provided x3 days

## 2011-03-01 NOTE — Progress Notes (Signed)
  Subjective:    Patient ID: Cody Raymond, male    DOB: 02/02/1971, 40 y.o.   MRN: [id number]  HPI patient presents to clinic for followup of multiple medical problems. At last visit metformin and Niaspan were held as patient believes an intended weight loss nausea and malaise began after initiating the medication. Presents today with no improvement despite holding the medication. Weight is stable. Malaise and nausea continue and has recently noted intermittent abdominal pain without radiation or affect with food. Location is epigastric and lower quadrants. No change in bowel habits. Does note incidental recent several day history of dysuria and urethral discharge. Notes monagomy. Also has had intermittent episodes of brief tachycardia without presyncope or syncope. Currently asymptomatic. Since holding metformin blood sugars have increased 150-160 without hypoglycemia. No other complaints  Reviewed past medical history, medications and allergies.  Review of Systems see history of present illness     Objective:   Physical Exam  Nursing note and vitals reviewed. Constitutional: He appears well-developed and well-nourished. No distress.  HENT:  Head: Normocephalic and atraumatic.  Eyes: Conjunctivae are normal. No scleral icterus.  Neck: Neck supple.  Cardiovascular: Normal rate, regular rhythm and normal heart sounds.   Pulmonary/Chest: Effort normal and breath sounds normal.  Abdominal: Soft. Bowel sounds are normal. He exhibits no distension and no mass. There is tenderness in the epigastric area and left lower quadrant. There is no rigidity, no rebound and no guarding.  Neurological: He is alert.  Skin: Skin is warm and dry. He is not diaphoretic.  Psychiatric: He has a normal mood and affect.          Assessment & Plan:

## 2011-03-01 NOTE — Assessment & Plan Note (Signed)
Suboptimal control off metformin. Resume metformin and monitor blood sugars as an outpatient.

## 2011-03-02 ENCOUNTER — Telehealth: Payer: Self-pay | Admitting: Internal Medicine

## 2011-03-02 ENCOUNTER — Telehealth: Payer: Self-pay

## 2011-03-02 LAB — GC/CHLAMYDIA PROBE AMP, URINE: GC Probe Amp, Urine: NEGATIVE

## 2011-03-02 NOTE — Telephone Encounter (Signed)
Patient would like to see Dr Leone Payor, he is scheduled to see Mike Gip PA 03/04/11 8:30 Dr Leone Payor is supervising.  Aurther Loft will notify the patient

## 2011-03-02 NOTE — Telephone Encounter (Signed)
Pt.notified

## 2011-03-02 NOTE — Telephone Encounter (Signed)
Message copied by Beverely Low on Wed Mar 02, 2011  2:33 PM ------      Message from: Staci Righter      Created: Wed Mar 02, 2011  8:10 AM       GC/chlamydia neg

## 2011-03-03 ENCOUNTER — Telehealth: Payer: Self-pay | Admitting: Internal Medicine

## 2011-03-03 NOTE — Telephone Encounter (Signed)
Pts back doctor is wanting to give pt a cortisone shot in pts spine, but it will increase pts blood sugar dramatically. Pt is second opinion. Pls call back asap.

## 2011-03-04 ENCOUNTER — Other Ambulatory Visit (INDEPENDENT_AMBULATORY_CARE_PROVIDER_SITE_OTHER): Payer: Managed Care, Other (non HMO)

## 2011-03-04 ENCOUNTER — Encounter: Payer: Self-pay | Admitting: Internal Medicine

## 2011-03-04 ENCOUNTER — Encounter: Payer: Self-pay | Admitting: Physician Assistant

## 2011-03-04 ENCOUNTER — Ambulatory Visit (INDEPENDENT_AMBULATORY_CARE_PROVIDER_SITE_OTHER)
Admission: RE | Admit: 2011-03-04 | Discharge: 2011-03-04 | Disposition: A | Discharge status: Home / Self Care | Payer: Managed Care, Other (non HMO) | Source: Ambulatory Visit | Attending: Physician Assistant | Admitting: Physician Assistant

## 2011-03-04 ENCOUNTER — Ambulatory Visit (INDEPENDENT_AMBULATORY_CARE_PROVIDER_SITE_OTHER): Payer: Managed Care, Other (non HMO) | Admitting: Physician Assistant

## 2011-03-04 VITALS — BP 132/86 | HR 96 | Ht 68.0 in | Wt 221.0 lb

## 2011-03-04 DIAGNOSIS — Z800 Family history of malignant neoplasm of digestive organs: Secondary | ICD-10-CM

## 2011-03-04 DIAGNOSIS — R109 Unspecified abdominal pain: Secondary | ICD-10-CM

## 2011-03-04 DIAGNOSIS — K5900 Constipation, unspecified: Secondary | ICD-10-CM

## 2011-03-04 DIAGNOSIS — R634 Abnormal weight loss: Secondary | ICD-10-CM

## 2011-03-04 DIAGNOSIS — R110 Nausea: Secondary | ICD-10-CM

## 2011-03-04 DIAGNOSIS — R1031 Right lower quadrant pain: Secondary | ICD-10-CM

## 2011-03-04 HISTORY — DX: Essential (primary) hypertension: I10

## 2011-03-04 LAB — BASIC METABOLIC PANEL
CO2: 30 mEq/L (ref 19–32)
Calcium: 9.4 mg/dL (ref 8.4–10.5)
Creatinine, Ser: 0.8 mg/dL (ref 0.4–1.5)

## 2011-03-04 MED ORDER — IOHEXOL 300 MG/ML  SOLN
100.0000 mL | Freq: Once | INTRAMUSCULAR | Status: AC | PRN
  Administered 2011-03-04: 100 mL via INTRAVENOUS

## 2011-03-04 NOTE — Progress Notes (Signed)
Subjective:    Patient ID: Cody Raymond, male    DOB: 01-Aug-1971, 40 y.o.   MRN: [id number]  HPI Cody Raymond is a very nice 40 year old Hispanic male referred by Dr. Rodena Medin for evaluation of multiple GI complaints. Patient does have a history of diabetes hypertriglyceridemia and obesity and hypertension. At this time he states he has been feeling poorly for the past 3-4 weeks. He has developed nausea which is actually been going on for the past several months and he associates with his medications. He has had new onset of bilateral lower abdominal pain and constipation. He says his usual habit was to have a bowel movement 2-3 times daily and now he has an urge for bowel movement but has been only going every couple of days with harder stools. Is unaware of any melena or hematochezia but admits he has not paid much attention. Appetite has been decreased and he has had a weight loss of 15 pounds. No fever or chills bili has had some occasional sweats at night. He generally does feel worse after eating with an increase in his lower, pain within 30 minutes of eating. His only prior abdominal surgery was an appendectomy. He does take one baby aspirin per day is not using any NSAIDs denies EtOH.  Patient does have a strong family history of colon cancer in his paternal grandfather diagnosed in his early 70s and 2 uncles both diagnosed in her 66s also on the paternal side of the family. He says his mother has had "stomach" polyps.  Laboratory studies done June 6 showed WBC of 6.9 hemoglobin 14.4 hematocrit of 40.1 MCV low at 75.8 platelets 222 electrolytes normal glucose 108 liver function studies normal and UA negative, chest x-ray was done and was unremarkable. He has not had any other imaging..    Review of Systems  Constitutional: Positive for appetite change.  HENT: Negative.   Eyes: Negative.   Respiratory: Negative.   Cardiovascular: Negative.   Gastrointestinal: Positive for nausea, abdominal  pain and constipation.  Genitourinary: Positive for dysuria.  Musculoskeletal: Positive for back pain.  Skin: Negative.   Hematological: Negative.   Psychiatric/Behavioral: Negative.    Outpatient Prescriptions Prior to Visit  Medication Sig Dispense Refill  . albuterol (VENTOLIN HFA) 108 (90 BASE) MCG/ACT inhaler Inhale 2 puffs into the lungs as needed. Use with spacer  1 Inhaler  6  . amLODipine (NORVASC) 2.5 MG tablet Take 1 tablet (2.5 mg total) by mouth daily.  30 tablet  3  . aspirin 81 MG tablet Take 81 mg by mouth daily.        . beclomethasone (QVAR) 80 MCG/ACT inhaler Inhale 2 puffs into the lungs 2 (two) times daily.  1 Inhaler  6  . Blood Glucose Monitoring Suppl (RELION CONFIRM GLUCOSE MONITOR) W/DEVICE KIT Please include meter as well as lancets and strips for 3 months.  1 kit  0  . clotrimazole-betamethasone (LOTRISONE) cream Apply to affected area 2 times daily  15 g  1  . finasteride (PROSCAR) 5 MG tablet Take 1 tablet (5 mg total) by mouth daily.  30 tablet  11  . FLOVENT HFA 220 MCG/ACT inhaler       . HYDROcodone-acetaminophen (NORCO) 5-325 MG per tablet Take 1 tablet by mouth every 6 (six) hours as needed.        . methocarbamol (ROBAXIN) 500 MG tablet Take 500 mg by mouth 3 (three) times daily.        . nitroGLYCERIN (NITROSTAT) 0.4 MG  SL tablet Place 1 tablet (0.4 mg total) under the tongue every 5 (five) minutes as needed for chest pain.  20 tablet  11  . omeprazole (PRILOSEC) 40 MG capsule Take 40 mg by mouth daily with dinner.        . pravastatin (PRAVACHOL) 40 MG tablet Take 40 mg by mouth daily.        Marland Kitchen Spacer/Aero-Holding Chambers (BREATHERITE COLL SPACER ADULT) MISC use spacer with inhaler to get more medicine in your lungs  1 each  0  . cetirizine (ZYRTEC) 10 MG tablet Take 10 mg by mouth daily.        Marland Kitchen etodolac (LODINE) 400 MG tablet Take 400 mg by mouth 2 (two) times daily.        . Choline Fenofibrate (TRILIPIX) 135 MG capsule Take 135 mg by mouth daily.         Marland Kitchen doxycycline (VIBRA-TABS) 100 MG tablet Take 1 tablet (100 mg total) by mouth 2 (two) times daily.  20 tablet  0  . montelukast (SINGULAIR) 10 MG tablet Take 10 mg by mouth daily.             Objective:   Physical Exam Well-developed Hispanic male in no acute distress, pleasant, alert and oriented x3 HEENT; nontraumatic normocephalic EOMI PERRLA sclera anicteric Neck; Supple no JVD  Cardiovascular regular rate and rhythm with S1-S2 no murmur or gallop  Pulmonary ;clear bilaterally   Abdomen; obese soft he is tender bilaterally in the lower quadrants and mildly anemia right upper quadrant there is no palpable mass or hepatosplenomegaly he does have some guarding but no rebound no palpable fluid wave, bowel sounds active  Rectal; scant stool Hemoccult negative  Extremitie;s benign no edema Site somewhat anxious but appropriate.        Assessment & Plan:  #40 year old male diabetic with hypertension and hyperlipidemia presenting with 40-month history of a lateral lower abdominal pain new onset of constipation weight loss nausea and decrease in appetite. Etiology of symptoms not clear. He has been taking hydrocodone for back pain and this may be contributing to his constipation, however weight loss, nausea, and pain are concerning.  Plan; continue Prilosec 20 mg by mouth twice daily Patient had been on Lodine which she stopped and he was advised to stay off of Start MiraLax 17 g daily in 8 ounces of water Schedule for CT scan of the abdomen and pelvis. Will also schedule for colonoscopy, both because of his symptoms and because of family history. Procedure was discussed in detail with patient who is agreeable.  #2 Chronic GERD  #3 Adult-onset diabetes mellitus  #4 hypertension  #5 hyperlipidemia  #6 Strong family history of colon cancer on the paternal side of the family with multiple family members. Colonoscopy scheduled as above.

## 2011-03-04 NOTE — Telephone Encounter (Signed)
Pt notified and verbalized understanding.

## 2011-03-04 NOTE — Patient Instructions (Signed)
  Please go to the basement level to have your labs drawn.  We have scheduled the Ct scan for today 03-04-11 at 1:00 PM  Directions and contrast provided. Location of the Ct scan is [address] across from [facility] on [address] Stay on the Prilosec twice daily. Take the Miralax samples, 1 packet or 17 grams once daily in an 8 oz glass of water or clear juice.  We will call you with the Ct scan and the lab results.

## 2011-03-04 NOTE — Telephone Encounter (Signed)
Ok to procede if recommended by specialist. Will need to call fsbs to [place] and we can adjust medication temporarily if needed.

## 2011-03-07 ENCOUNTER — Ambulatory Visit (INDEPENDENT_AMBULATORY_CARE_PROVIDER_SITE_OTHER): Payer: Managed Care, Other (non HMO) | Admitting: Internal Medicine

## 2011-03-07 ENCOUNTER — Encounter: Payer: Self-pay | Admitting: Internal Medicine

## 2011-03-07 DIAGNOSIS — M549 Dorsalgia, unspecified: Secondary | ICD-10-CM

## 2011-03-07 DIAGNOSIS — R109 Unspecified abdominal pain: Secondary | ICD-10-CM

## 2011-03-07 DIAGNOSIS — E119 Type 2 diabetes mellitus without complications: Secondary | ICD-10-CM

## 2011-03-07 NOTE — Progress Notes (Signed)
Agree with this assessment and plan. Iva Boop, MD, Clementeen Graham

## 2011-03-07 NOTE — Assessment & Plan Note (Signed)
RF test strips and resume daily fsbs. May need metformin adjustment with epidural injxn and pt will call with elevated results.

## 2011-03-07 NOTE — Assessment & Plan Note (Signed)
Hold nsaids. Proceed with epidural injxn. Call with fsbs report after injxn.

## 2011-03-07 NOTE — Progress Notes (Signed)
  Subjective:    Patient ID: Cody Raymond, male    DOB: Jun 28, 1971, 40 y.o.   MRN: [id number]  HPI Pt presents to clinic for evaluation of multiple medical problems. Continues with now chronic dizziness, abdominal pain, and nausea. Was able to see GI who scheduled colonoscopy next week and performed abd CT felt to be unremarkable. Pt questions possible medication side effects and did not improve after holding metformin and niaspan. Does take lodine, hydrocodone and robaxin for chronic lumbar back pain currently being evaluated by surgery. Scheduled for lumbar epidural injxn in near future. Currently unknown diabetic control as ran out of test strips. Is resuming metformin today after holding medication for CT. Indicates will be filing short term disability paperwork. 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   Reviewed pmh, medications and allergies.    Review of Systems  Constitutional: Negative for fever and chills.  Gastrointestinal: Positive for nausea and abdominal pain. Negative for blood in stool.  Musculoskeletal: Positive for back pain. Negative for arthralgias.  Neurological: Positive for dizziness and light-headedness. Negative for syncope.       Objective:   Physical Exam  Nursing note and vitals reviewed. Constitutional: He appears well-developed and well-nourished. No distress.  HENT:  Head: Normocephalic and atraumatic.  Right Ear: External ear normal.  Left Ear: External ear normal.  Nose: Nose normal.  Eyes: Conjunctivae are normal. No scleral icterus.  Neurological: He is alert.  Skin: Skin is warm and dry. He is not diaphoretic.  Psychiatric: He has a normal mood and affect.          Assessment & Plan:

## 2011-03-07 NOTE — Assessment & Plan Note (Signed)
Undergoing GI evaluation with colonoscopy pending. Advised to hold lodine and all nsaids. Attempt to minimize narcotic use if epidural helps. Anticipate short term disability paperwork. Scheduled close followup.

## 2011-03-10 ENCOUNTER — Telehealth: Payer: Self-pay | Admitting: *Deleted

## 2011-03-10 NOTE — Telephone Encounter (Signed)
Patient given results as per Paula Guenther, NP 

## 2011-03-10 NOTE — Telephone Encounter (Signed)
Per Willette Cluster, NP, patient's CT and labs are okay. Please, call him with results. Left patient a message to call me

## 2011-03-14 ENCOUNTER — Telehealth: Payer: Self-pay | Admitting: *Deleted

## 2011-03-14 NOTE — Telephone Encounter (Signed)
Pt states daughter dropped off FMLA and disabiltiy paperwork last Wednsday to be completed.  Calling to check status.

## 2011-03-15 ENCOUNTER — Telehealth: Payer: Self-pay | Admitting: Internal Medicine

## 2011-03-15 DIAGNOSIS — Z800 Family history of malignant neoplasm of digestive organs: Secondary | ICD-10-CM

## 2011-03-15 DIAGNOSIS — R110 Nausea: Secondary | ICD-10-CM

## 2011-03-15 DIAGNOSIS — R634 Abnormal weight loss: Secondary | ICD-10-CM

## 2011-03-15 NOTE — Telephone Encounter (Signed)
Rx for Moviprep called in to [facility] on [address]

## 2011-03-16 ENCOUNTER — Other Ambulatory Visit: Payer: Self-pay | Admitting: Internal Medicine

## 2011-03-16 NOTE — Telephone Encounter (Signed)
Pt called to check on status for FMLA paperwork and disability paperwork. Pt says that he has been waiting 5 wks to get all paperwork taken care of. Pt is sch for colonoscopy tomorrow and needs to have paperwork faxed to # on forms that were dropped off asap.

## 2011-03-16 NOTE — Telephone Encounter (Signed)
Patient couldn't afford Moviprep kit so patient was told to come pick up a sample kit. Patient and pharmacy informed.

## 2011-03-17 ENCOUNTER — Ambulatory Visit (AMBULATORY_SURGERY_CENTER): Payer: Managed Care, Other (non HMO) | Admitting: Internal Medicine

## 2011-03-17 ENCOUNTER — Encounter: Payer: Self-pay | Admitting: Internal Medicine

## 2011-03-17 ENCOUNTER — Ambulatory Visit: Payer: Managed Care, Other (non HMO) | Admitting: Internal Medicine

## 2011-03-17 DIAGNOSIS — K5900 Constipation, unspecified: Secondary | ICD-10-CM

## 2011-03-17 DIAGNOSIS — R109 Unspecified abdominal pain: Secondary | ICD-10-CM

## 2011-03-17 DIAGNOSIS — D126 Benign neoplasm of colon, unspecified: Secondary | ICD-10-CM

## 2011-03-17 DIAGNOSIS — Z800 Family history of malignant neoplasm of digestive organs: Secondary | ICD-10-CM

## 2011-03-17 DIAGNOSIS — R634 Abnormal weight loss: Secondary | ICD-10-CM

## 2011-03-17 HISTORY — PX: COLONOSCOPY W/ POLYPECTOMY: SHX1380

## 2011-03-17 LAB — GLUCOSE, CAPILLARY

## 2011-03-17 MED ORDER — DICYCLOMINE HCL 20 MG PO TABS: ORAL_TABLET | ORAL | Status: DC

## 2011-03-17 MED ORDER — SODIUM CHLORIDE 0.9 % IV SOLN: 500.0000 mL | INTRAVENOUS | Status: DC

## 2011-03-17 NOTE — Patient Instructions (Signed)
Discharge instructions given with verbal understanding. Handout on polyp given. Resume previous medications.  

## 2011-03-17 NOTE — Telephone Encounter (Signed)
Pt aware forms will be completed and faxed upon Dr. Ilda Foil return to office

## 2011-03-18 ENCOUNTER — Telehealth: Payer: Self-pay

## 2011-03-18 NOTE — Telephone Encounter (Signed)
FMLA forms faxed to [employer] Fax number [phone]  Disability forms faxed to [employer] at fax number [phone]

## 2011-03-18 NOTE — Telephone Encounter (Signed)

## 2011-03-28 ENCOUNTER — Ambulatory Visit (INDEPENDENT_AMBULATORY_CARE_PROVIDER_SITE_OTHER): Payer: Managed Care, Other (non HMO) | Admitting: Internal Medicine

## 2011-03-28 ENCOUNTER — Encounter: Payer: Self-pay | Admitting: Internal Medicine

## 2011-03-28 DIAGNOSIS — G8929 Other chronic pain: Secondary | ICD-10-CM

## 2011-03-28 DIAGNOSIS — M549 Dorsalgia, unspecified: Secondary | ICD-10-CM

## 2011-03-28 DIAGNOSIS — R109 Unspecified abdominal pain: Secondary | ICD-10-CM

## 2011-03-28 DIAGNOSIS — E119 Type 2 diabetes mellitus without complications: Secondary | ICD-10-CM

## 2011-03-28 MED ORDER — TRAMADOL HCL 50 MG PO TABS: 50.0000 mg | ORAL_TABLET | Freq: Four times a day (QID) | ORAL | Status: AC | PRN

## 2011-03-28 NOTE — Assessment & Plan Note (Signed)
Mildly improved. Continue to hold anti-inflammatory. Attempt probiotic for loose stools after colonoscopy.

## 2011-03-28 NOTE — Assessment & Plan Note (Addendum)
Epidural injection pending. Attempt change from hydrocodone to Ultram when necessary.

## 2011-03-28 NOTE — Progress Notes (Signed)
  Subjective:    Patient ID: Cody Raymond, male    DOB: 1971/03/14, 40 y.o.   MRN: [id number]  HPI Pt presents to clinic for followup of multiple medical problems. Continues to have difficulty with dizziness and abdominal pain however the abdominal pain is improved. He has been holding his anti-inflammatory. Does have some GERD symptoms despite PPI therapy twice a day. Recently underwent colonoscopy the finding of a polyp. Since that time has noted watery stools without blood. Suffers from chronic low back pain and is scheduled for epidural injection in the future. Wonders if his hydrocodone is related to his symptoms as well. States is not checking fingerstick blood sugar as he is out of work and cannot afford test strips. Disability and fmla paperwork have been completed. Weight is stable. No other complaints  Reviewed past medical history, medications and allergies.  Review of Systems  Constitutional: Negative for fever, chills and unexpected weight change.  Gastrointestinal: Positive for nausea, abdominal pain and diarrhea. Negative for constipation and blood in stool.  Musculoskeletal: Positive for back pain.  Psychiatric/Behavioral: Positive for sleep disturbance.        Objective:   Physical Exam  Nursing note and vitals reviewed. Constitutional: He appears well-developed and well-nourished. No distress.  HENT:  Head: Normocephalic and atraumatic.  Eyes: Conjunctivae are normal. No scleral icterus.  Abdominal: Soft. Bowel sounds are normal. He exhibits no distension and no mass. There is no hepatosplenomegaly. There is generalized tenderness. There is no rebound and no guarding.  Neurological: He is alert.  Skin: Skin is warm and dry. He is not diaphoretic.  Psychiatric: He has a normal mood and affect.          Assessment & Plan:

## 2011-03-28 NOTE — Assessment & Plan Note (Signed)
Test strip samples provided. Resume fingerstick blood sugar

## 2011-03-28 NOTE — Patient Instructions (Signed)
Please take an over the counter probiotic for approximately 2 weeks

## 2011-03-29 ENCOUNTER — Encounter: Payer: Self-pay | Admitting: Internal Medicine

## 2011-03-29 ENCOUNTER — Telehealth: Payer: Self-pay | Admitting: *Deleted

## 2011-03-29 NOTE — Telephone Encounter (Signed)
Pt notified forms faxed to [employer] this morning

## 2011-03-29 NOTE — Telephone Encounter (Signed)
Pt states disability forms were faxed on yesterday requesting additional information.  Pt checking status of form completion.

## 2011-03-29 NOTE — Progress Notes (Signed)
Quick Note:  No polyp - benign colon mucosa 10 year recall ______

## 2011-04-01 ENCOUNTER — Telehealth: Payer: Self-pay

## 2011-04-01 NOTE — Telephone Encounter (Signed)
FMLA form needs a return to work date. Pt stating 6 months. Pt would like forms faxed to [employer] at [place]. Fax # [phone]  Dr. Rodena Medin appended forms with a return to work date of 1 month from today's date 04/01/11. Forms faxed to Larena Sox at [facility]

## 2011-04-01 NOTE — Telephone Encounter (Signed)
Requesting call back concerning questions about FMLA

## 2011-04-06 ENCOUNTER — Telehealth: Payer: Self-pay | Admitting: Internal Medicine

## 2011-04-06 NOTE — Telephone Encounter (Signed)
Pt states that Dr. Leone Payor told his wife after the colon that his problems were most likely related to 1 of the 12 medications that he takes. Pt states he needs Dr. Leone Payor to dictate a letter stating this to send to his disability company. Please advise.

## 2011-04-07 ENCOUNTER — Telehealth: Payer: Self-pay | Admitting: *Deleted

## 2011-04-07 NOTE — Telephone Encounter (Signed)
[facility] Triage Call Report Triage Record Num: [id number] Operator: Alphonsa Overall Patient Name: Cody Raymond Call Date & Time: 04/06/2011 8:06:19PM Patient Phone: [phone] PCP: Oliver Barre Patient Gender: Male PCP Fax : [phone] Patient DOB: 1970-11-29 Practice Name: [facility] Reason for Call: Caio calling about blood sugar changes. Onset 04/05/11. Shot of Cortisone 04/05/11. 323 at 1945. Lightheadedness,hot flashes,nausea, drinking more 04/06/11. Metformin taken at 1915. No insulin available. Pt aware need ED evaluation. Care advice given. Protocol(s) Used: Diabetes: Control Problems Recommended Outcome per Protocol: See ED Immediately Reason for Outcome: Signs and symptoms of ketoacidosis AND blood sugar more than 300 mg/dl Care Advice: ~ Another adult should drive. ~ If available, bring recent log of blood sugars or bring blood glucose monitor with log of blood sugars. Dehydration can affect blood sugar levels. Drink water during transport and while waiting to see a provider. If vomiting, take sips of water or suck on ice chips. ~ ~ When sitting, use a chair with arms to help protect from falling. ~ IMMEDIATE ACTION Write down provider's name. List or place the following in a bag for transport with the patient: current prescription and/or nonprescription medications; alternative treatments, therapies and medications; and street drugs. ~ ~ Call EMS 911 if any loss of consciousness, difficult to awaken, slow to respond, or new onset of confused thinking. 07/

## 2011-04-11 ENCOUNTER — Telehealth: Payer: Self-pay

## 2011-04-11 ENCOUNTER — Telehealth: Payer: Self-pay | Admitting: Internal Medicine

## 2011-04-11 NOTE — Telephone Encounter (Signed)
Documents completed twice for pt's Short Term Disability. Pt states that Rosann Auerbach needs to know the dates and names of medication that Dr. Rodena Medin will be taking him off of each week. Please advise

## 2011-04-11 NOTE — Telephone Encounter (Signed)
Call placed to patient at [phone].Patient states short term disability is requesting additional documentation in order to continue with payments stating that he is still under physician care for management of his symptoms ,and medication he is currently taking

## 2011-04-11 NOTE — Telephone Encounter (Signed)
Patient states that he is on short term disability. He needs Dr. Rodena Medin to write a letter stating the reason why he is out of short term disability. Patient states that it is because of the 12 meds that he is on. Also, patient had a cortisone shot and the shot made his blood sugar go up from 250 to 323. He is now taking two metformin for that.

## 2011-04-13 NOTE — Telephone Encounter (Signed)
Let him know I do not recall saying that or making a definitive statement to that effect. I was unaware he was on short-term disability. His PCP did this. I have received forms and completed the re: colonoscopy and have completed them indicating normal routine procedures to return to work the next day after a colonoscopy - as I have not been involved in the disability decisions and inidicated that on the form.  He needs to make a next available follow-up re: any ongoing GI issues and we can discuss further then.

## 2011-04-13 NOTE — Telephone Encounter (Signed)
Receiving multiple messages and faxes with differing requests. Have completed multiple forms for disability. Any further information can be obtained from medical records OR need to have ONE specific request for further information and what exactly it needs to say. Literally have 3 requests for different versions. Also in one day i believe pt called at least 3-4 times. Can inform him that this is non productive. If this is becoming an issue with his insurance company then he may want to involve his back doctor in this as well.

## 2011-04-13 NOTE — Telephone Encounter (Signed)
Patient advised he will call back and schedule an office visit if he has continued GI issues.

## 2011-04-15 NOTE — Telephone Encounter (Signed)
Spoke with [employer]ann Auerbach rep who states that they need the OV note from 03/28/2011. Pt signed medical release for [employer]. OV note faxed to Larena Sox, Disability Claim Manager at [phone]. [employer] phone # [phone].  Forms sent to be scanned

## 2011-04-22 ENCOUNTER — Encounter: Payer: Self-pay | Admitting: Internal Medicine

## 2011-04-27 ENCOUNTER — Encounter: Payer: Self-pay | Admitting: Internal Medicine

## 2011-05-02 ENCOUNTER — Encounter: Payer: Self-pay | Admitting: Internal Medicine

## 2011-05-02 ENCOUNTER — Ambulatory Visit (INDEPENDENT_AMBULATORY_CARE_PROVIDER_SITE_OTHER): Payer: Managed Care, Other (non HMO) | Admitting: Internal Medicine

## 2011-05-02 DIAGNOSIS — R42 Dizziness and giddiness: Secondary | ICD-10-CM

## 2011-05-02 DIAGNOSIS — F411 Generalized anxiety disorder: Secondary | ICD-10-CM

## 2011-05-02 DIAGNOSIS — E119 Type 2 diabetes mellitus without complications: Secondary | ICD-10-CM

## 2011-05-02 DIAGNOSIS — F419 Anxiety disorder, unspecified: Secondary | ICD-10-CM

## 2011-05-02 DIAGNOSIS — G8929 Other chronic pain: Secondary | ICD-10-CM

## 2011-05-02 DIAGNOSIS — M549 Dorsalgia, unspecified: Secondary | ICD-10-CM

## 2011-05-02 LAB — HEMOGLOBIN A1C
Hgb A1c MFr Bld: 6.1 % — ABNORMAL HIGH (ref <5.7)
Mean Plasma Glucose: 128 mg/dL — ABNORMAL HIGH (ref <117)

## 2011-05-02 MED ORDER — ALPRAZOLAM 0.25 MG PO TABS: 0.2500 mg | ORAL_TABLET | Freq: Two times a day (BID) | ORAL | Status: DC | PRN

## 2011-05-02 NOTE — Patient Instructions (Signed)
Please be fasting for next appointment

## 2011-05-03 DIAGNOSIS — R42 Dizziness and giddiness: Secondary | ICD-10-CM | POA: Insufficient documentation

## 2011-05-03 NOTE — Progress Notes (Signed)
  Subjective:    Patient ID: Cody Raymond, male    DOB: [birth year], 40 y.o.   MRN: [id number]  HPI Pt presents to clinic for followup of multiple medical problems. Suffering from chronic back pain failing conservative care. No improvement with epidural injections. Scheduled for back surgery in the near future. States surgery removed him from work last week. Temporarily was able to stop narcotic medication and take ultram instead and felt less dizziness however resumed narcotic due to pain. C/o 2-3 wk h/o nervousness and decreased sleep. Appears to have worsening anxiety with continued back pain. After epidural injxn fsbs rose to 200-300 now recently improved to low 100's without hypoglycemia.   Reviewed pmh, medications and allergies    Review of Systems see hpi     Objective:   Physical Exam  Nursing note and vitals reviewed. Constitutional: He appears well-developed and well-nourished. No distress.  HENT:  Head: Normocephalic and atraumatic.  Eyes: Conjunctivae are normal. No scleral icterus.  Neck: Neck supple.  Cardiovascular: Normal rate, regular rhythm and normal heart sounds.  Exam reveals no gallop and no friction rub.   No murmur heard. Pulmonary/Chest: Effort normal and breath sounds normal. No respiratory distress. He has no wheezes. He has no rales.  Neurological: He is alert.  Skin: Skin is warm and dry. He is not diaphoretic.  Psychiatric: He has a normal mood and affect.          Assessment & Plan:

## 2011-05-03 NOTE — Assessment & Plan Note (Signed)
Attempt low dose short term xanax sparingly. tsh nl 2/12. Aware of potential for addiction/tolerance.

## 2011-05-03 NOTE — Assessment & Plan Note (Signed)
Failing conservative care. Back surgery scheduled. Has appointment to be evaluated preoperatively by cardiology.

## 2011-05-03 NOTE — Assessment & Plan Note (Signed)
Suspect analgesia side effect. Hopeful wean off in future if back pain improved with surgery.

## 2011-05-03 NOTE — Assessment & Plan Note (Signed)
Temporary loss of control after epidural injxn. Now returning to baseline. Obtain a1c

## 2011-05-25 ENCOUNTER — Ambulatory Visit (HOSPITAL_COMMUNITY)
Admission: RE | Admit: 2011-05-25 | Discharge: 2011-05-25 | Disposition: A | Discharge status: Home / Self Care | Payer: Worker's Compensation | Source: Ambulatory Visit | Attending: Orthopedic Surgery | Admitting: Orthopedic Surgery

## 2011-05-25 ENCOUNTER — Other Ambulatory Visit (HOSPITAL_COMMUNITY): Payer: Self-pay | Admitting: Orthopedic Surgery

## 2011-05-25 ENCOUNTER — Other Ambulatory Visit: Payer: Self-pay | Admitting: Orthopedic Surgery

## 2011-05-25 ENCOUNTER — Encounter (HOSPITAL_COMMUNITY): Payer: Worker's Compensation

## 2011-05-25 DIAGNOSIS — Z01818 Encounter for other preprocedural examination: Secondary | ICD-10-CM | POA: Insufficient documentation

## 2011-05-25 DIAGNOSIS — Z01812 Encounter for preprocedural laboratory examination: Secondary | ICD-10-CM | POA: Insufficient documentation

## 2011-05-25 DIAGNOSIS — M5126 Other intervertebral disc displacement, lumbar region: Secondary | ICD-10-CM

## 2011-05-25 LAB — NO BLOOD PRODUCTS

## 2011-05-25 LAB — COMPREHENSIVE METABOLIC PANEL
AST: 25 U/L (ref 0–37)
Albumin: 3.9 g/dL (ref 3.5–5.2)
BUN: 13 mg/dL (ref 6–23)
Calcium: 9.2 mg/dL (ref 8.4–10.5)
Chloride: 101 mEq/L (ref 96–112)
Creatinine, Ser: 0.75 mg/dL (ref 0.50–1.35)
Total Bilirubin: 0.4 mg/dL (ref 0.3–1.2)
Total Protein: 7.5 g/dL (ref 6.0–8.3)

## 2011-05-25 LAB — SURGICAL PCR SCREEN
MRSA, PCR: NEGATIVE
Staphylococcus aureus: NEGATIVE

## 2011-05-25 LAB — URINALYSIS, ROUTINE W REFLEX MICROSCOPIC
Bilirubin Urine: NEGATIVE
Leukocytes, UA: NEGATIVE
Nitrite: NEGATIVE
Specific Gravity, Urine: 1.026 (ref 1.005–1.030)
Urobilinogen, UA: 0.2 mg/dL (ref 0.0–1.0)
pH: 6 (ref 5.0–8.0)

## 2011-05-25 LAB — APTT: aPTT: 29 seconds (ref 24–37)

## 2011-05-25 LAB — CBC
HCT: 41.7 % (ref 39.0–52.0)
MCV: 78.1 fL (ref 78.0–100.0)
Platelets: 251 10*3/uL (ref 150–400)
RBC: 5.34 MIL/uL (ref 4.22–5.81)
RDW: 16.2 % — ABNORMAL HIGH (ref 11.5–15.5)
WBC: 6.8 10*3/uL (ref 4.0–10.5)

## 2011-05-25 LAB — DIFFERENTIAL
Basophils Absolute: 0 10*3/uL (ref 0.0–0.1)
Eosinophils Absolute: 0.1 10*3/uL (ref 0.0–0.7)
Lymphocytes Relative: 24 % (ref 12–46)
Lymphs Abs: 1.7 10*3/uL (ref 0.7–4.0)
Neutrophils Relative %: 67 % (ref 43–77)

## 2011-05-25 LAB — PROTIME-INR
INR: 0.95 (ref 0.00–1.49)
Prothrombin Time: 12.9 seconds (ref 11.6–15.2)

## 2011-06-03 ENCOUNTER — Ambulatory Visit (HOSPITAL_COMMUNITY): Payer: Worker's Compensation

## 2011-06-03 ENCOUNTER — Other Ambulatory Visit: Payer: Self-pay | Admitting: Orthopedic Surgery

## 2011-06-03 ENCOUNTER — Ambulatory Visit (HOSPITAL_COMMUNITY)
Admission: RE | Admit: 2011-06-03 | Discharge: 2011-06-05 | Disposition: A | Discharge status: Home / Self Care | Payer: Worker's Compensation | Source: Ambulatory Visit | Attending: Orthopedic Surgery | Admitting: Orthopedic Surgery

## 2011-06-03 DIAGNOSIS — M5126 Other intervertebral disc displacement, lumbar region: Secondary | ICD-10-CM | POA: Insufficient documentation

## 2011-06-03 DIAGNOSIS — J45909 Unspecified asthma, uncomplicated: Secondary | ICD-10-CM | POA: Insufficient documentation

## 2011-06-03 DIAGNOSIS — I2510 Atherosclerotic heart disease of native coronary artery without angina pectoris: Secondary | ICD-10-CM | POA: Insufficient documentation

## 2011-06-03 DIAGNOSIS — E119 Type 2 diabetes mellitus without complications: Secondary | ICD-10-CM | POA: Insufficient documentation

## 2011-06-03 DIAGNOSIS — Z01812 Encounter for preprocedural laboratory examination: Secondary | ICD-10-CM | POA: Insufficient documentation

## 2011-06-03 DIAGNOSIS — Z23 Encounter for immunization: Secondary | ICD-10-CM | POA: Insufficient documentation

## 2011-06-03 LAB — GLUCOSE, CAPILLARY
Glucose-Capillary: 143 mg/dL — ABNORMAL HIGH (ref 70–99)
Glucose-Capillary: 142 mg/dL — ABNORMAL HIGH (ref 70–99)

## 2011-06-04 LAB — GLUCOSE, CAPILLARY
Glucose-Capillary: 171 mg/dL — ABNORMAL HIGH (ref 70–99)
Glucose-Capillary: 131 mg/dL — ABNORMAL HIGH (ref 70–99)

## 2011-06-04 NOTE — Op Note (Signed)
Cody Raymond, Cody Raymond            ACCOUNT NO.:  [id number]  MEDICAL RECORD NO.:  [id number]  LOCATION:  1306                         FACILITY:  [facility]  PHYSICIAN:  Georges Lynch. [place], M.D.DATE OF BIRTH:  [birth year]  DATE OF PROCEDURE:  06/03/2011 DATE OF DISCHARGE:                              OPERATIVE REPORT   SURGEON:  Georges Lynch. Darrelyn Hillock, M.D.  ASSISTANT:  Marlowe Kays, M.D.  PREOPERATIVE DIAGNOSES: 1. 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 2. 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 3. He had weakness of the dorsiflexors of the toes, extensors of the     right foot.  POSTOPERATIVE DIAGNOSES: 1. Severe lateral recess stenosis of L4-5 on the right. 2. 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  OPERATIONS: 1. A decompressive lumbar laminectomy at L4-5 on the right for spinal     stenosis. 2. Foraminotomies of the L4 root and L5 root on the right for     stenosis. 3. Microdiskectomy L4-5 on the right for herniated disk.  PROCEDURE:  Under general anesthesia, routine orthopedic prep and drape was carried out with the patient on spinal frame.  Several x-rays were taken after two needles were placed in the back and that was done after the appropriate time-out, so we did a time-out first and later inserted the needle for x-ray.  Also the right side of the back was appropriately marked in the holding area.  After the x-rays were taken an incision was made over L4-L5 interspace in the usual fashion.  Deep bleeders were identified and cauterized.  The muscle was stripped bilaterally from the spinous process of L4-L5 region.  Another x-ray was taken to verify the position.  Self-retaining retractors were inserted.  I then carried out hemilaminectomy in the usual fashion at L4-L5.  The microscope was used. My assistant was Dr. Simonne Come who assisted in the hemilaminectomy and with retraction.  At this time, we went out laterally and noted a severe compression  of the L5 root from the stenosis.  We kept going out laterally until the recess was free.  We cauterized  the lateral recess vein.  Dr. Simonne Come was kind enough to go ahead and retract the dura with the D'Errico retractor.  At this time, I cauterized the lateral recess veins.  We identified the L5 root and went up proximally and did a foraminotomy for the L4 root.  At this particular time, we were  able to easily pass a hockey-stick out the L4 root foramina and after doing a foraminotomy distally for the L5 root now free, the main issue was the herniated disk.  We gently retracted the dura, identified the disk.  The needle was placed in the disk space.  An x-ray was taken to verify the L4-L5 disk.  At this time, a cruciate incision was made in the posterior longitudinal ligament.  We then utilized the Epstein curette and the nerve hook to free up the subligamentous disk, materially compress it down into the disk space and completed the discectomy and the specimen was sent.  When the procedure was completed, we sent the specimen to pathology.  Of note, Dr. Simonne Come did help from the opposite  side to help decompress the recess on my side while the appropriate retraction was carried out.  Following that, we thoroughly irrigated out the wound loosely and Dr. Simonne Come retracted the dura with the D'Errico when I inserted some thrombin-soaked Gelfoam loosely in the lateral gutter. The wound then was closed in layers in the usual fashion, except a small deep distal and proximal part of the wound left open for drainage purposes.  The subcu was closed with 0 Vicryl, skin with metal staples.  A sterile Neosporin dressing was applied.  The patient left the operative room in satisfactory condition.          ______________________________ Georges Lynch Darrelyn Hillock, M.D.     RAG/MEDQ  D:  06/03/2011  T:  06/03/2011  Job:  [id number]  Electronically Signed by Ranee Gosselin M.D. on 06/04/2011 07:58:02  AM

## 2011-06-05 ENCOUNTER — Ambulatory Visit (HOSPITAL_COMMUNITY): Payer: Worker's Compensation

## 2011-06-05 LAB — GLUCOSE, CAPILLARY: Glucose-Capillary: 138 mg/dL — ABNORMAL HIGH (ref 70–99)

## 2011-06-16 LAB — COMPREHENSIVE METABOLIC PANEL
ALT: 32
AST: 29
Alkaline Phosphatase: 95
CO2: 24
Calcium: 9
GFR calc Af Amer: >60
GFR calc non Af Amer: >60
Potassium: 3.6
Sodium: 136
Total Protein: 7

## 2011-06-16 LAB — CBC
Hemoglobin: 14.1
MCHC: 35.2
RBC: 5.1

## 2011-06-16 LAB — DIFFERENTIAL
Basophils Absolute: 0.1
Eosinophils Relative: 2
Lymphocytes Relative: 31
Neutrophils Relative %: 59

## 2011-06-16 LAB — LIPASE, BLOOD: Lipase: 24

## 2011-06-16 LAB — POCT CARDIAC MARKERS
CKMB, poc: <1 — ABNORMAL LOW
Myoglobin, poc: 29.1
Myoglobin, poc: 47.8

## 2011-06-16 LAB — D-DIMER, QUANTITATIVE: D-Dimer, Quant: 0.37

## 2011-06-24 ENCOUNTER — Other Ambulatory Visit (HOSPITAL_COMMUNITY): Payer: Self-pay

## 2011-07-27 ENCOUNTER — Ambulatory Visit (INDEPENDENT_AMBULATORY_CARE_PROVIDER_SITE_OTHER): Payer: Managed Care, Other (non HMO) | Admitting: Internal Medicine

## 2011-07-27 ENCOUNTER — Encounter: Payer: Self-pay | Admitting: Internal Medicine

## 2011-07-27 DIAGNOSIS — E785 Hyperlipidemia, unspecified: Secondary | ICD-10-CM

## 2011-07-27 DIAGNOSIS — E781 Pure hyperglyceridemia: Secondary | ICD-10-CM

## 2011-07-27 DIAGNOSIS — I519 Heart disease, unspecified: Secondary | ICD-10-CM

## 2011-07-27 DIAGNOSIS — J45909 Unspecified asthma, uncomplicated: Secondary | ICD-10-CM

## 2011-07-27 DIAGNOSIS — R112 Nausea with vomiting, unspecified: Secondary | ICD-10-CM

## 2011-07-27 DIAGNOSIS — E119 Type 2 diabetes mellitus without complications: Secondary | ICD-10-CM

## 2011-07-27 MED ORDER — OMEPRAZOLE 40 MG PO CPDR: 40.0000 mg | DELAYED_RELEASE_CAPSULE | Freq: Every day | ORAL | Status: DC

## 2011-07-27 MED ORDER — AMLODIPINE BESYLATE 2.5 MG PO TABS: 2.5000 mg | ORAL_TABLET | Freq: Every day | ORAL | Status: DC

## 2011-07-27 MED ORDER — TAMSULOSIN HCL 0.4 MG PO CAPS: 0.4000 mg | ORAL_CAPSULE | ORAL | Status: DC

## 2011-07-27 MED ORDER — NIACIN ER (ANTIHYPERLIPIDEMIC) 1000 MG PO TBCR: 1000.0000 mg | EXTENDED_RELEASE_TABLET | Freq: Every day | ORAL | Status: DC

## 2011-07-27 MED ORDER — PRAVASTATIN SODIUM 40 MG PO TABS: 40.0000 mg | ORAL_TABLET | Freq: Every day | ORAL | Status: DC

## 2011-07-27 MED ORDER — FENOFIBRATE MICRONIZED 134 MG PO CAPS: 134.0000 mg | ORAL_CAPSULE | Freq: Every day | ORAL | Status: DC

## 2011-07-27 MED ORDER — ALPRAZOLAM 0.25 MG PO TABS: 0.2500 mg | ORAL_TABLET | Freq: Two times a day (BID) | ORAL | Status: DC | PRN

## 2011-07-27 MED ORDER — ALBUTEROL SULFATE HFA 108 (90 BASE) MCG/ACT IN AERS: 2.0000 | INHALATION_SPRAY | Freq: Four times a day (QID) | RESPIRATORY_TRACT | Status: DC | PRN

## 2011-07-27 MED ORDER — METFORMIN HCL 500 MG PO TABS: 500.0000 mg | ORAL_TABLET | Freq: Every day | ORAL | Status: DC

## 2011-07-27 NOTE — Progress Notes (Signed)
  Subjective:    Patient ID: Cody Raymond, male    DOB: 12-09-1970, 40 y.o.   MRN: [id number]  HPI Pt presents to clinic for followup of multiple medical problems. Notes fsbs 80's-220 but the highest are postprandial. No hypoglycemia. Taking metformin qd. Notes continued anxiety with stressors. Taking xanax prn without evidence of addiction or tolerance. Now s/p back surgery and improved. Performing PT. Notes sl intermittent wheeze recently without infection. Weather changes may have provoked wheezing. No dyspnea. No other complaints.  Past Medical History  Diagnosis Date  . Diabetes mellitus   . Asthma   . Enlarged prostate   . Hyperlipidemia   . Heart disease   . Hypertension   . Blood transfusion   . GERD (gastroesophageal reflux disease)    Past Surgical History  Procedure Date  . Appendectomy   . Patent ductus arterious repair     at age 58  . Colonoscopy w/ polypectomy 03/17/11    diminutive polyp  . 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    reports that he has never smoked. He has never used smokeless tobacco. He reports that he drinks alcohol. He reports that he does not use illicit drugs. family history includes Aneurysm in his father; Breast cancer in his maternal grandmother; Colon cancer in his paternal grandfather and paternal uncle; Colon polyps in his mother; Heart attack (age of onset:50) in his father; Heart disease in his mother; Liver disease in an unspecified family member; Prostate cancer in his paternal grandfather; and Stomach cancer in his brother. Allergies  Allergen Reactions  . Benadryl (Altaryl)     "drives me nuts"     Review of Systems see hpi     Objective:   Physical Exam  Physical Exam  Nursing note and vitals reviewed. Constitutional: Appears well-developed and well-nourished. No distress.  HENT:  Head: Normocephalic and atraumatic.    Right Ear: External ear normal.  Left Ear: External ear normal.  Eyes: Conjunctivae are normal. No scleral icterus.  Neck: Neck supple. Carotid bruit is not present.  Cardiovascular: Normal rate, regular rhythm and normal heart sounds.  Exam reveals no gallop and no friction rub.   No murmur heard. Pulmonary/Chest: Effort normal and breath sounds normal. No respiratory distress. He has no wheezes. no rales.  Lymphadenopathy:    He has no cervical adenopathy.  Neurological:Alert.  Skin: Skin is warm and dry. Not diaphoretic.  Psychiatric: Has a normal mood and affect.        Assessment & Plan:

## 2011-07-28 LAB — CBC
Hemoglobin: 14.8 g/dL (ref 13.0–17.0)
MCV: 79.7 fL (ref 78.0–100.0)
Platelets: 252 10*3/uL (ref 150–400)
RBC: 5.61 MIL/uL (ref 4.22–5.81)
WBC: 5.4 10*3/uL (ref 4.0–10.5)

## 2011-07-28 LAB — BASIC METABOLIC PANEL
CO2: 28 mEq/L (ref 19–32)
Chloride: 100 mEq/L (ref 96–112)
Sodium: 138 mEq/L (ref 135–145)

## 2011-07-28 LAB — LIPID PANEL
Cholesterol: 217 mg/dL — ABNORMAL HIGH (ref 0–200)
Total CHOL/HDL Ratio: 5.4 Ratio
Triglycerides: 333 mg/dL — ABNORMAL HIGH (ref <150)
VLDL: 67 mg/dL — ABNORMAL HIGH (ref 0–40)

## 2011-07-28 LAB — HEPATIC FUNCTION PANEL
ALT: 33 U/L (ref 0–53)
Albumin: 4.5 g/dL (ref 3.5–5.2)
Total Protein: 7.8 g/dL (ref 6.0–8.3)

## 2011-07-31 NOTE — Assessment & Plan Note (Signed)
Attempt sample of advair 250 bid with mouth rinsing after administration. Followup if no improvement or worsening.

## 2011-07-31 NOTE — Assessment & Plan Note (Signed)
Obtain lipid/lft. 

## 2011-07-31 NOTE — Assessment & Plan Note (Signed)
Obtain cbc chem7, a1c

## 2011-08-03 ENCOUNTER — Other Ambulatory Visit: Payer: Self-pay | Admitting: Internal Medicine

## 2011-08-03 DIAGNOSIS — E785 Hyperlipidemia, unspecified: Secondary | ICD-10-CM

## 2011-08-03 MED ORDER — PRAVASTATIN SODIUM 40 MG PO TABS: 80.0000 mg | ORAL_TABLET | Freq: Every day | ORAL | Status: DC

## 2011-09-27 ENCOUNTER — Telehealth: Payer: Self-pay | Admitting: Internal Medicine

## 2011-09-27 DIAGNOSIS — E119 Type 2 diabetes mellitus without complications: Secondary | ICD-10-CM

## 2011-09-27 DIAGNOSIS — J45909 Unspecified asthma, uncomplicated: Secondary | ICD-10-CM

## 2011-09-27 DIAGNOSIS — R112 Nausea with vomiting, unspecified: Secondary | ICD-10-CM

## 2011-09-27 MED ORDER — ALBUTEROL SULFATE (2.5 MG/3ML) 0.083% IN NEBU: 2.5000 mg | INHALATION_SOLUTION | Freq: Four times a day (QID) | RESPIRATORY_TRACT | Status: DC | PRN

## 2011-09-27 NOTE — Telephone Encounter (Signed)
Rx sent to pharmacy per Dr Rodena Medin approval.

## 2011-10-28 ENCOUNTER — Ambulatory Visit: Payer: Self-pay | Admitting: Internal Medicine

## 2011-11-04 ENCOUNTER — Encounter: Payer: Self-pay | Admitting: Internal Medicine

## 2011-11-04 ENCOUNTER — Ambulatory Visit (INDEPENDENT_AMBULATORY_CARE_PROVIDER_SITE_OTHER): Payer: Managed Care, Other (non HMO) | Admitting: Internal Medicine

## 2011-11-04 VITALS — BP 110/82 | HR 95 | Temp 97.8°F | Resp 16 | Ht 68.0 in | Wt 225.0 lb

## 2011-11-04 DIAGNOSIS — E119 Type 2 diabetes mellitus without complications: Secondary | ICD-10-CM

## 2011-11-04 DIAGNOSIS — E781 Pure hyperglyceridemia: Secondary | ICD-10-CM

## 2011-11-04 DIAGNOSIS — E669 Obesity, unspecified: Secondary | ICD-10-CM

## 2011-11-04 DIAGNOSIS — E785 Hyperlipidemia, unspecified: Secondary | ICD-10-CM

## 2011-11-04 LAB — HEMOGLOBIN A1C
Hgb A1c MFr Bld: 6.2 % — ABNORMAL HIGH (ref <5.7)
Mean Plasma Glucose: 131 mg/dL — ABNORMAL HIGH (ref <117)

## 2011-11-04 LAB — BASIC METABOLIC PANEL
Potassium: 4.2 mEq/L (ref 3.5–5.3)
Sodium: 138 mEq/L (ref 135–145)

## 2011-11-04 MED ORDER — NITROGLYCERIN 0.4 MG SL SUBL: 0.4000 mg | SUBLINGUAL_TABLET | SUBLINGUAL | Status: DC | PRN

## 2011-11-04 NOTE — Progress Notes (Signed)
  Subjective:    Patient ID: Cody Raymond, male    DOB: May 19, 1971, 41 y.o.   MRN: [id number]  HPI Pt presents to clinic for followup of multiple medical problems. Reports fsbs 100-150 without hypoglycemia. Tolerating statin tx. S/p back surgery states is being released by surgery. Still has pain from lumbar disc and was told had nerve damage. Was recommended for disability by surgery per pt. BP reviewed normotensive.  Past Medical History  Diagnosis Date  . Diabetes mellitus   . Asthma   . Enlarged prostate   . Hyperlipidemia   . Heart disease   . Hypertension   . Blood transfusion   . GERD (gastroesophageal reflux disease)    Past Surgical History  Procedure Date  . Appendectomy   . Patent ductus arterious repair     at age 60  . Colonoscopy w/ polypectomy 03/17/11    diminutive polyp  . Cardiac catheterization 2007    Clean Cardiac Cath [facility] Cards), with RCA 30% narrowing likely due to catheter induced spasm in 09/02/10.    reports that he has never smoked. He has never used smokeless tobacco. He reports that he drinks alcohol. He reports that he does not use illicit drugs. family history includes Aneurysm in his father; Breast cancer in his maternal grandmother; Colon cancer in his paternal grandfather and paternal uncle; Colon polyps in his mother; Heart attack (age of onset:50) in his father; Heart disease in his mother; Liver disease in an unspecified family member; Prostate cancer in his paternal grandfather; and Stomach cancer in his brother. Allergies  Allergen Reactions  . Benadryl (Altaryl)     "drives me nuts"      Review of Systems see hpi     Objective:   Physical Exam  Physical Exam  Nursing note and vitals reviewed. Constitutional: Appears well-developed and well-nourished. No distress.  HENT:  Head: Normocephalic and atraumatic.  Right Ear: External ear normal.  Left Ear: External ear normal.  Eyes: Conjunctivae are normal. No scleral  icterus.  Neck: Neck supple. Carotid bruit is not present.  Cardiovascular: Normal rate, regular rhythm and normal heart sounds.  Exam reveals no gallop and no friction rub.   No murmur heard. Pulmonary/Chest: Effort normal and breath sounds normal. No respiratory distress. He has no wheezes. no rales.  Lymphadenopathy:    He has no cervical adenopathy.  Neurological:Alert.  Skin: Skin is warm and dry. Not diaphoretic.  Psychiatric: Has a normal mood and affect.        Assessment & Plan:

## 2011-11-04 NOTE — Assessment & Plan Note (Signed)
Obtain chem7, a1c, urine microalbumin 

## 2011-11-04 NOTE — Assessment & Plan Note (Signed)
Obtain lipid/lft. 

## 2011-11-04 NOTE — Assessment & Plan Note (Signed)
Wt loss encouraged especially with chronic back pain and DM

## 2011-11-04 NOTE — Patient Instructions (Signed)
Please schedule chem7, a1c 250.0 prior to next visit 

## 2011-11-05 LAB — HEPATIC FUNCTION PANEL
AST: 35 U/L (ref 0–37)
Alkaline Phosphatase: 124 U/L — ABNORMAL HIGH (ref 39–117)
Bilirubin, Direct: 0.1 mg/dL (ref 0.0–0.3)
Total Bilirubin: 0.6 mg/dL (ref 0.3–1.2)

## 2011-11-05 LAB — MICROALBUMIN / CREATININE URINE RATIO
Creatinine, Urine: 199.2 mg/dL
Microalb Creat Ratio: 4.1 mg/g (ref 0.0–30.0)

## 2011-11-05 LAB — LIPID PANEL: Total CHOL/HDL Ratio: 5.3 Ratio

## 2011-11-16 ENCOUNTER — Other Ambulatory Visit: Payer: Self-pay | Admitting: Internal Medicine

## 2011-11-16 DIAGNOSIS — E785 Hyperlipidemia, unspecified: Secondary | ICD-10-CM

## 2011-11-16 DIAGNOSIS — E119 Type 2 diabetes mellitus without complications: Secondary | ICD-10-CM

## 2011-12-13 ENCOUNTER — Other Ambulatory Visit: Payer: Self-pay

## 2011-12-13 ENCOUNTER — Emergency Department (HOSPITAL_COMMUNITY): Payer: Managed Care, Other (non HMO)

## 2011-12-13 ENCOUNTER — Emergency Department (HOSPITAL_COMMUNITY)
Admission: EM | Admit: 2011-12-13 | Discharge: 2011-12-14 | Disposition: A | Discharge status: Home / Self Care | Payer: Managed Care, Other (non HMO) | Attending: Emergency Medicine | Admitting: Emergency Medicine

## 2011-12-13 ENCOUNTER — Encounter (HOSPITAL_COMMUNITY): Payer: Self-pay | Admitting: Emergency Medicine

## 2011-12-13 DIAGNOSIS — E7800 Pure hypercholesterolemia, unspecified: Secondary | ICD-10-CM | POA: Insufficient documentation

## 2011-12-13 DIAGNOSIS — R51 Headache: Secondary | ICD-10-CM | POA: Insufficient documentation

## 2011-12-13 DIAGNOSIS — R000 Tachycardia, unspecified: Secondary | ICD-10-CM | POA: Insufficient documentation

## 2011-12-13 DIAGNOSIS — R1915 Other abnormal bowel sounds: Secondary | ICD-10-CM | POA: Insufficient documentation

## 2011-12-13 DIAGNOSIS — E785 Hyperlipidemia, unspecified: Secondary | ICD-10-CM | POA: Insufficient documentation

## 2011-12-13 DIAGNOSIS — R0609 Other forms of dyspnea: Secondary | ICD-10-CM | POA: Insufficient documentation

## 2011-12-13 DIAGNOSIS — R079 Chest pain, unspecified: Secondary | ICD-10-CM | POA: Insufficient documentation

## 2011-12-13 DIAGNOSIS — R197 Diarrhea, unspecified: Secondary | ICD-10-CM | POA: Insufficient documentation

## 2011-12-13 DIAGNOSIS — R110 Nausea: Secondary | ICD-10-CM | POA: Insufficient documentation

## 2011-12-13 DIAGNOSIS — Z79899 Other long term (current) drug therapy: Secondary | ICD-10-CM | POA: Insufficient documentation

## 2011-12-13 DIAGNOSIS — Z7982 Long term (current) use of aspirin: Secondary | ICD-10-CM | POA: Insufficient documentation

## 2011-12-13 DIAGNOSIS — K219 Gastro-esophageal reflux disease without esophagitis: Secondary | ICD-10-CM | POA: Insufficient documentation

## 2011-12-13 DIAGNOSIS — R61 Generalized hyperhidrosis: Secondary | ICD-10-CM | POA: Insufficient documentation

## 2011-12-13 DIAGNOSIS — R0989 Other specified symptoms and signs involving the circulatory and respiratory systems: Secondary | ICD-10-CM | POA: Insufficient documentation

## 2011-12-13 DIAGNOSIS — R55 Syncope and collapse: Secondary | ICD-10-CM | POA: Insufficient documentation

## 2011-12-13 DIAGNOSIS — E119 Type 2 diabetes mellitus without complications: Secondary | ICD-10-CM | POA: Insufficient documentation

## 2011-12-13 DIAGNOSIS — J45909 Unspecified asthma, uncomplicated: Secondary | ICD-10-CM | POA: Insufficient documentation

## 2011-12-13 LAB — GLUCOSE, CAPILLARY: Glucose-Capillary: 139 mg/dL — ABNORMAL HIGH (ref 70–99)

## 2011-12-13 MED ORDER — SODIUM CHLORIDE 0.9 % IV BOLUS (SEPSIS)
1000.0000 mL | Freq: Once | INTRAVENOUS | Status: AC
  Administered 2011-12-14: 1000 mL via INTRAVENOUS

## 2011-12-13 MED ORDER — ONDANSETRON HCL 4 MG/2ML IJ SOLN
4.0000 mg | INTRAMUSCULAR | Status: AC
  Administered 2011-12-14: 4 mg via INTRAVENOUS
  Filled 2011-12-13: qty 2

## 2011-12-13 MED ORDER — SODIUM CHLORIDE 0.9 % IV SOLN
Freq: Once | INTRAVENOUS | Status: AC
  Administered 2011-12-14: 01:00:00 via INTRAVENOUS

## 2011-12-13 NOTE — ED Provider Notes (Signed)
History     CSN: [id number]  Arrival date & time 12/13/11  2239   First MD Initiated Contact with Patient 12/13/11 2323      Chief Complaint  Patient presents with  . Chest Pain  . Hypertension    (Consider location/radiation/quality/duration/timing/severity/associated sxs/prior treatment) HPI Comments: 41 year old male who presents with complaint of left-sided chest pain. He states that this occurred earlier in the day, was severe, left-sided, pressure and was associated with nausea, diaphoresis, difficulty breathing and a severe posterior headache. He noted that his blood pressure at that time according to his home blood pressure cuff was over 200 systolic. This has persisted throughout the day though it is gradually improved and currently his pain is only 2/10. He admits to also having watery diarrhea for 4 episodes of becoming near syncopal just prior to arrival when he was in a car  His past medical history significant for hypertension, diabetes, cholesterol, early family history of heart disease  Patient is a 41 y.o. male presenting with chest pain and hypertension. The history is provided by the patient, the spouse and medical records.  Chest Pain  His past medical history is significant for hypertension.    Hypertension Associated symptoms include chest pain.    Past Medical History  Diagnosis Date  . Diabetes mellitus   . Asthma   . Enlarged prostate   . Hyperlipidemia   . Heart disease   . Hypertension   . Blood transfusion   . GERD (gastroesophageal reflux disease)     Past Surgical History  Procedure Date  . Appendectomy   . Patent ductus arterious repair     at age 36  . Colonoscopy w/ polypectomy 03/17/11    diminutive polyp  . 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    Family History  Problem Relation Age of Onset  . Colon cancer Paternal  Grandfather   . Prostate cancer Paternal Grandfather   . Aneurysm Father   . Heart attack Father 82  . Coronary artery disease Father   . Colon cancer Paternal Uncle     x 2  . Colon polyps Mother   . Heart disease Mother   . Coronary artery disease Mother   . Breast cancer Maternal Grandmother   . Stomach cancer Brother   . Liver disease      unsure who it was    History  Substance Use Topics  . Smoking status: Never Smoker   . Smokeless tobacco: Never Used  . Alcohol Use: Yes     rare      Review of Systems  Cardiovascular: Positive for chest pain.  All other systems reviewed and are negative.    Allergies  Benadryl  Home Medications   Current Outpatient Rx  Name Route Sig Dispense Refill  . ALBUTEROL SULFATE HFA 108 (90 BASE) MCG/ACT IN AERS Inhalation Inhale 2 puffs into the lungs every 6 (six) hours as needed. Use with spacer    . ALBUTEROL SULFATE (2.5 MG/3ML) 0.083% IN NEBU Nebulization Take 3 mLs (2.5 mg total) by nebulization every 6 (six) hours as needed for wheezing. 75 mL 12  . ALPRAZOLAM 0.25 MG PO TABS Oral Take 1 tablet (0.25 mg total) by mouth 2 (two) times daily as needed for anxiety. 30 tablet 1  . AMLODIPINE BESYLATE 2.5 MG PO TABS Oral Take 1 tablet (2.5 mg total) by mouth daily. 30  tablet 6  . ASPIRIN 81 MG PO TABS Oral Take 81 mg by mouth daily.      . BECLOMETHASONE DIPROPIONATE 80 MCG/ACT IN AERS Inhalation Inhale 2 puffs into the lungs 2 (two) times daily. 1 Inhaler 6  . DICYCLOMINE HCL 20 MG PO TABS  1/2 - 1 tablet every 6 hours as needed for abdominal pain    . FENOFIBRATE MICRONIZED 134 MG PO CAPS Oral Take 1 capsule (134 mg total) by mouth daily before breakfast. 30 capsule 6  . FINASTERIDE 5 MG PO TABS Oral Take 1 tablet (5 mg total) by mouth daily. 30 tablet 11  . METFORMIN HCL 500 MG PO TABS Oral Take 1 tablet (500 mg total) by mouth daily with breakfast. 30 tablet 6  . NITROGLYCERIN 0.4 MG SL SUBL Sublingual Place 1 tablet (0.4 mg  total) under the tongue every 5 (five) minutes as needed for chest pain. 20 tablet 11  . OMEPRAZOLE 40 MG PO CPDR Oral Take 1 capsule (40 mg total) by mouth daily with supper. 30 capsule 11  . PRAVASTATIN SODIUM 40 MG PO TABS Oral Take 2 tablets (80 mg total) by mouth daily. 60 tablet 5    Change in dose 08/03/2011  . TRAMADOL HCL 50 MG PO TABS Oral Take 1 tablet (50 mg total) by mouth every 6 (six) hours as needed for pain. 30 tablet 0  . NAPROXEN 500 MG PO TABS Oral Take 1 tablet (500 mg total) by mouth 2 (two) times daily with a meal. 30 tablet 0    BP 119/99  Pulse 91  Temp(Src) 98.7 F (37.1 C) (Oral)  Resp 16  SpO2 96%  Physical Exam  Nursing note and vitals reviewed. Constitutional: He appears well-developed and well-nourished. No distress.  HENT:  Head: Normocephalic and atraumatic.  Mouth/Throat: Oropharynx is clear and moist. No oropharyngeal exudate.  Eyes: Conjunctivae and EOM are normal. Pupils are equal, round, and reactive to light. Right eye exhibits no discharge. Left eye exhibits no discharge. No scleral icterus.  Neck: Normal range of motion. Neck supple. No JVD present. No thyromegaly present.  Cardiovascular: Normal rate, regular rhythm, normal heart sounds and intact distal pulses.  Exam reveals no gallop and no friction rub.   No murmur heard. Pulmonary/Chest: Effort normal and breath sounds normal. No respiratory distress. He has no wheezes. He has no rales. He exhibits no tenderness.  Abdominal: Soft. Bowel sounds are normal. He exhibits no distension and no mass. There is no tenderness.  Musculoskeletal: Normal range of motion. He exhibits no edema and no tenderness.  Lymphadenopathy:    He has no cervical adenopathy.  Neurological: He is alert. Coordination normal.  Skin: Skin is warm and dry. No rash noted. No erythema.  Psychiatric: He has a normal mood and affect. His behavior is normal.    ED Course  Procedures (including critical care time)  ED  ECG REPORT   Date: 12/14/2011   Rate: 108  Rhythm: normal sinus rhythm  QRS Axis: normal  Intervals: normal  ST/T Wave abnormalities: normal  Conduction Disutrbances:none  Narrative Interpretation:   Old EKG Reviewed: Since 02/21/2011, no significant changes   Labs Reviewed  GLUCOSE, CAPILLARY - Abnormal; Notable for the following:    Glucose-Capillary 139 (*)    All other components within normal limits  CBC - Abnormal; Notable for the following:    RDW 16.6 (*)    All other components within normal limits  POCT I-STAT, CHEM 8 - Abnormal; Notable  for the following:    Potassium 3.4 (*)    BUN 30 (*)    Glucose, Bld 108 (*)    Calcium, Ion 1.08 (*)    All other components within normal limits  POCT I-STAT, CHEM 8 - Abnormal; Notable for the following:    Glucose, Bld 164 (*)    All other components within normal limits  DIFFERENTIAL  APTT  PROTIME-INR  POCT I-STAT TROPONIN I  CARDIAC PANEL(CRET KIN+CKTOT+MB+TROPI)   Ct Head Wo Contrast  12/14/2011  *RADIOLOGY REPORT*  Clinical Data: Headache.  CT HEAD WITHOUT CONTRAST  Technique:  Contiguous axial images were obtained from the base of the skull through the vertex without contrast.  Comparison: None.  Findings: There is no evidence of acute infarction, mass lesion, or intra- or extra-axial hemorrhage on CT.  The posterior fossa, including the cerebellum, brainstem and fourth ventricle, is within normal limits.  The third and lateral ventricles, and basal ganglia are unremarkable in appearance.  The cerebral hemispheres are symmetric in appearance, with normal gray- white differentiation.  No mass effect or midline shift is seen.  There is no evidence of fracture; visualized osseous structures are unremarkable in appearance.  The orbits are within normal limits. A small mucus retention cyst or polyp is noted within the left maxillary sinus; the remaining paranasal sinuses and mastoid air cells are well-aerated.  No significant soft  tissue abnormalities are seen.  IMPRESSION:  1.  No acute intracranial pathology seen on CT. 2.  Small mucus retention cyst or polyp within the left maxillary sinus.  Original Report Authenticated By: Tonia Ghent, M.D.   Dg Chest Port 1 View  12/14/2011  *RADIOLOGY REPORT*  Clinical Data: Mid chest pain, radiating to the left side; shortness of breath and weakness.  History of asthma.  PORTABLE CHEST - 1 VIEW  Comparison: Chest radiograph performed 06/05/2011  Findings: The lungs are well-aerated and clear.  There is no evidence of focal opacification, pleural effusion or pneumothorax.  The cardiomediastinal silhouette is within normal limits.  No acute osseous abnormalities are seen.  IMPRESSION: No acute cardiopulmonary process seen.  Original Report Authenticated By: Tonia Ghent, M.D.     1. Chest pain   2. Headache       MDM  Recheck of vital signs on arrival show blood pressure of 132/83, afebrile, sinus tachycardia, respirations of 20. He has increased bowel sounds, likely has an component of gastroenteritis, evaluate cardiac etiologies with labs and chest x-ray, has family history of brain aneurysm and with significant headache and hypertension will evaluate for hemorrhage.  1:30, patient reevaluated and found to have normal blood pressure, tachycardia has resolved and chest pain is completely gone. He has a pressure in his head which she describes as a headache. CT scan reveals no signs of hemorrhage or obvious aneurysm. Laboratory evaluation at this point reveals no signs of elevated troponin, normal renal function, normal blood counts, slight hyperglycemia. Second set of cardiac enzymes pending at this time  Second set of cardiac markers are normal, the patient's headache has resolved with Toradol, the patient is amenable to discharge with followup with family doctor for further testing and possible stress test. I have encouraged him to have a stress test within 2 weeks if possible or  return for severe or worsening chest pain.  Vida Roller, MD 12/14/11 [phone]

## 2011-12-13 NOTE — ED Notes (Signed)
Pt states his blood pressure has been high today and he started having chest pain around 1800 this evening  Pt states he took amlodipine this evening around 1730  Pt states he has a headache too  Pt states he has ntg sl but has not used any of it

## 2011-12-13 NOTE — ED Notes (Signed)
CBG 139; RN Misty Stanley notified

## 2011-12-14 ENCOUNTER — Emergency Department (HOSPITAL_COMMUNITY): Payer: Managed Care, Other (non HMO)

## 2011-12-14 LAB — DIFFERENTIAL
Eosinophils Absolute: 0.1 10*3/uL (ref 0.0–0.7)
Eosinophils Relative: 1 % (ref 0–5)
Lymphocytes Relative: 14 % (ref 12–46)
Lymphs Abs: 0.9 10*3/uL (ref 0.7–4.0)
Monocytes Absolute: 0.6 10*3/uL (ref 0.1–1.0)

## 2011-12-14 LAB — POCT I-STAT, CHEM 8
BUN: 14 mg/dL (ref 6–23)
BUN: 30 mg/dL — ABNORMAL HIGH (ref 6–23)
Chloride: 104 mEq/L (ref 96–112)
Creatinine, Ser: 0.8 mg/dL (ref 0.50–1.35)
Creatinine, Ser: 1.1 mg/dL (ref 0.50–1.35)
Glucose, Bld: 108 mg/dL — ABNORMAL HIGH (ref 70–99)
Glucose, Bld: 164 mg/dL — ABNORMAL HIGH (ref 70–99)
Potassium: 3.4 mEq/L — ABNORMAL LOW (ref 3.5–5.1)
Potassium: 4.1 mEq/L (ref 3.5–5.1)
Sodium: 141 mEq/L (ref 135–145)

## 2011-12-14 LAB — CBC
HCT: 44.3 % (ref 39.0–52.0)
MCH: 26.7 pg (ref 26.0–34.0)
MCV: 78.3 fL (ref 78.0–100.0)
RBC: 5.66 MIL/uL (ref 4.22–5.81)
RDW: 16.6 % — ABNORMAL HIGH (ref 11.5–15.5)
WBC: 6.1 10*3/uL (ref 4.0–10.5)

## 2011-12-14 LAB — CARDIAC PANEL(CRET KIN+CKTOT+MB+TROPI)
CK, MB: 1.5 ng/mL (ref 0.3–4.0)
Relative Index: INVALID (ref 0.0–2.5)
Total CK: 84 U/L (ref 7–232)
Troponin I: <0.3 ng/mL (ref <0.30)

## 2011-12-14 MED ORDER — KETOROLAC TROMETHAMINE 30 MG/ML IJ SOLN
30.0000 mg | Freq: Once | INTRAMUSCULAR | Status: AC
  Administered 2011-12-14: 30 mg via INTRAVENOUS
  Filled 2011-12-14: qty 1

## 2011-12-14 MED ORDER — NAPROXEN 500 MG PO TABS: 500.0000 mg | ORAL_TABLET | Freq: Two times a day (BID) | ORAL | Status: DC

## 2011-12-14 NOTE — ED Notes (Signed)
Patient transported to CT 

## 2011-12-14 NOTE — Discharge Instructions (Signed)
Please call your Dr. in the morning to schedule a followup appointment. you should have a stress test done in the next 2 weeks if you should continue to have chest pain.  Return to the hospital for severe, worsening or recurrent chest pain.  Your x-ray, CT scan of the head and lab work have all been normal, please let your doctor no of these results  Your caregiver has diagnosed you as having chest pain that is nonspecific for one problem. This means that after looking at you and examining you and ordering tests (such as blood work, chest x-rays and EKG), your caregiver does not believe that the problem is serious enough to need watching in the hospital. This judgment is often made after testing shows no acute heart attack and you are at low risk for sudden acute heart condition. Chest pain comes from many different causes.  Seek immediate medical attention if:   You have severe chest pain, especially if the pain is crushing or pressure-like and spreads to the arms, back, neck, or jaw, or if you have sweating, nausea, shortness of breath. This is an emergency. Don't wait to see if the pain will go away. Get medical help at once. Call 911 immediately. Do not drive herself to the hospital.   Your chest pain gets worse and does not go away with rest.   You have an attack of chest pain lasting longer than usual, despite rest and treatment with the medications your caregiver has prescribed   You awaken from sleep with chest pain or shortness of breath.   You feel faint or dizzy   You have chest pain not typical of your usual pain for which you originally saw your caregiver.  You must have a repeat evaluation within 24 hours for a recheck of your heart.  Please call your doctor this morning to schedule this appointment. If you do not have a family doctor, please see the list of doctors below.  RESOURCE GUIDE  Dental Problems  Patients with Medicaid: [facility] [phone] [address]                                           [phone] [address] Phone:  [phone]                                                  Phone:  [phone]  If unable to pay or uninsured, contact:  [facility] or [facility]. to become qualified for the adult dental clinic.  Chronic Pain Problems Contact [facility]  [phone] Patients need to be referred by their primary care doctor.  Insufficient Money for Medicine Contact [employer]:  call "211" or [facility] [phone].  No Primary Care Doctor Call [facility]  9511621426 Other agencies that provide inexpensive medical care    [facility]  [phone]    [facility]  [phone]    [facility]  [phone]    [facility]  2167174248    [facility]  208-538-8254  [facility]  [phone]  Psychological Services [facility]  [phone] [facility]  [phone] [facility]   [phone] (emergency services [phone])  Substance Abuse Resources [facility]  [phone] [facility] [phone] [facility] 571-239-1327 Floydene Flock [phone] Residential & Outpatient Substance Abuse Program  520-550-2939  Abuse/Neglect [facility] Child Abuse Hotline 919 702 [address] [facility] Child Abuse Hotline 531-667-0605 (After Hours)  Emergency Shelter [facility] 319-742-4953  Maternity Homes [facility] at [facility] (704) 221-1324 [facility] 2103383756  MRSA Hotline #:   5012414775    [facility] Resources  [facility]     [employer]                          [facility] Dept. [address] [place]                       [address]      [address] [place] [address]  Blondell Reveal Phone:  [phone]                                   Phone:  [phone]                 Phone:  [phone]  [facility] Mental Health Phone:  [phone]  [facility] Child Abuse Hotline (856)483-0160 6177592268 (After Hours)

## 2011-12-14 NOTE — ED Notes (Signed)
Patient c/o intermittent chest pressure since yesterday evening accompanied with nausea; diaphoresis and diarrhea. Patient denies emesis or ShOB.

## 2012-02-01 ENCOUNTER — Ambulatory Visit (INDEPENDENT_AMBULATORY_CARE_PROVIDER_SITE_OTHER): Payer: Managed Care, Other (non HMO) | Admitting: Internal Medicine

## 2012-02-01 ENCOUNTER — Encounter: Payer: Self-pay | Admitting: Internal Medicine

## 2012-02-01 VITALS — BP 124/80 | HR 93 | Temp 98.3°F | Resp 18 | Ht 68.0 in | Wt 224.0 lb

## 2012-02-01 DIAGNOSIS — N63 Unspecified lump in unspecified breast: Secondary | ICD-10-CM

## 2012-02-01 DIAGNOSIS — R300 Dysuria: Secondary | ICD-10-CM

## 2012-02-01 DIAGNOSIS — E119 Type 2 diabetes mellitus without complications: Secondary | ICD-10-CM

## 2012-02-01 DIAGNOSIS — R369 Urethral discharge, unspecified: Secondary | ICD-10-CM

## 2012-02-01 LAB — HEMOGLOBIN A1C
Hgb A1c MFr Bld: 6.2 % — ABNORMAL HIGH (ref <5.7)
Mean Plasma Glucose: 131 mg/dL — ABNORMAL HIGH (ref <117)

## 2012-02-01 LAB — BASIC METABOLIC PANEL
Calcium: 9.3 mg/dL (ref 8.4–10.5)
Glucose, Bld: 124 mg/dL — ABNORMAL HIGH (ref 70–99)
Sodium: 136 mEq/L (ref 135–145)

## 2012-02-01 MED ORDER — LEVOFLOXACIN 500 MG PO TABS: 500.0000 mg | ORAL_TABLET | Freq: Every day | ORAL | Status: AC

## 2012-02-01 MED ORDER — FENOFIBRATE MICRONIZED 134 MG PO CAPS: 134.0000 mg | ORAL_CAPSULE | Freq: Every day | ORAL | Status: DC

## 2012-02-01 MED ORDER — NITROGLYCERIN 0.4 MG SL SUBL: 0.4000 mg | SUBLINGUAL_TABLET | SUBLINGUAL | Status: DC | PRN

## 2012-02-01 NOTE — Assessment & Plan Note (Signed)
Obtain chem7 and a1c 

## 2012-02-01 NOTE — Assessment & Plan Note (Signed)
Schedule diagnostic mammogram.   

## 2012-02-01 NOTE — Progress Notes (Signed)
  Subjective:    Patient ID: Cody Raymond, male    DOB: [birth year], 41 y.o.   MRN: [id number]  HPI Pt presents to clinic for followup of multiple medical problems. Continues with chronic back pain and possible leg paresthesias. Currently being evaluated by specialist as second opinion and scheduled for ?NCS. Notes recent clear urethral discharge of undetermined duration with associated dysuria and odor. No hematuria. Has intermittent left scrotal pain without tenderness. Notes 2 month h/o left breast soft tissue tender mass.   Past Medical History  Diagnosis Date  . Diabetes mellitus   . Asthma   . Enlarged prostate   . Hyperlipidemia   . Heart disease   . Hypertension   . Blood transfusion   . GERD (gastroesophageal reflux disease)    Past Surgical History  Procedure Date  . Appendectomy   . Patent ductus arterious repair     at age 57  . Colonoscopy w/ polypectomy 03/17/11    diminutive polyp  . 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    reports that he has never smoked. He has never used smokeless tobacco. He reports that he drinks alcohol. He reports that he does not use illicit drugs. family history includes Aneurysm in his father; Breast cancer in his maternal grandmother; Colon cancer in his paternal grandfather and paternal uncle; Colon polyps in his mother; Coronary artery disease in his father and mother; Heart attack (age of onset:50) in his father; Heart disease in his mother; Liver disease in an unspecified family member; Prostate cancer in his paternal grandfather; and Stomach cancer in his brother. Allergies  Allergen Reactions  . Benadryl (Diphenhydramine Hcl)     "drives me nuts"      Review of Systems     Objective:   Physical Exam  Nursing note and vitals reviewed. Constitutional: He appears well-developed and well-nourished. No distress.  Skin: He  is not diaphoretic.       Left breast exam- small ?1cm density noted left areolar area. Roughly oblong and tender.           Assessment & Plan:

## 2012-02-01 NOTE — Patient Instructions (Signed)
We are in the process of scheduling a mammogram for you at [facility]

## 2012-02-01 NOTE — Assessment & Plan Note (Signed)
Obtain urine lcr for gc and chlamydia.

## 2012-02-01 NOTE — Assessment & Plan Note (Signed)
Obtain ua and urine cx. Begin levaquin. Followup if no improvement or worsening.

## 2012-02-02 LAB — URINALYSIS, ROUTINE W REFLEX MICROSCOPIC
Bilirubin Urine: NEGATIVE
Protein, ur: NEGATIVE mg/dL
Urobilinogen, UA: 0.2 mg/dL (ref 0.0–1.0)

## 2012-02-03 ENCOUNTER — Other Ambulatory Visit: Payer: Self-pay | Admitting: Internal Medicine

## 2012-02-03 DIAGNOSIS — N63 Unspecified lump in unspecified breast: Secondary | ICD-10-CM

## 2012-02-03 LAB — URINE CULTURE: Colony Count: NO GROWTH

## 2012-02-08 ENCOUNTER — Encounter: Payer: Self-pay | Admitting: Internal Medicine

## 2012-02-20 ENCOUNTER — Ambulatory Visit
Admission: RE | Admit: 2012-02-20 | Discharge: 2012-02-20 | Disposition: A | Discharge status: Home / Self Care | Payer: Managed Care, Other (non HMO) | Source: Ambulatory Visit | Attending: Internal Medicine | Admitting: Internal Medicine

## 2012-02-20 DIAGNOSIS — N63 Unspecified lump in unspecified breast: Secondary | ICD-10-CM

## 2012-02-22 ENCOUNTER — Ambulatory Visit: Payer: Self-pay | Admitting: Internal Medicine

## 2012-02-28 ENCOUNTER — Ambulatory Visit: Payer: Self-pay | Admitting: Internal Medicine

## 2012-03-15 ENCOUNTER — Other Ambulatory Visit: Payer: Self-pay | Admitting: Internal Medicine

## 2012-03-16 NOTE — Telephone Encounter (Signed)
Rx refill sent to pharmacy. 

## 2012-04-04 ENCOUNTER — Emergency Department (HOSPITAL_COMMUNITY): Payer: Managed Care, Other (non HMO)

## 2012-04-04 ENCOUNTER — Inpatient Hospital Stay (HOSPITAL_COMMUNITY)
Admission: EM | Admit: 2012-04-04 | Discharge: 2012-04-06 | DRG: 261 | Disposition: A | Discharge status: Home / Self Care | Payer: Managed Care, Other (non HMO) | Attending: Family Medicine | Admitting: Family Medicine

## 2012-04-04 ENCOUNTER — Encounter (HOSPITAL_COMMUNITY): Payer: Self-pay

## 2012-04-04 ENCOUNTER — Telehealth: Payer: Self-pay | Admitting: Internal Medicine

## 2012-04-04 DIAGNOSIS — E1142 Type 2 diabetes mellitus with diabetic polyneuropathy: Secondary | ICD-10-CM | POA: Diagnosis present

## 2012-04-04 DIAGNOSIS — R296 Repeated falls: Secondary | ICD-10-CM | POA: Diagnosis present

## 2012-04-04 DIAGNOSIS — S51809A Unspecified open wound of unspecified forearm, initial encounter: Secondary | ICD-10-CM | POA: Diagnosis present

## 2012-04-04 DIAGNOSIS — I519 Heart disease, unspecified: Secondary | ICD-10-CM

## 2012-04-04 DIAGNOSIS — I10 Essential (primary) hypertension: Secondary | ICD-10-CM

## 2012-04-04 DIAGNOSIS — I2510 Atherosclerotic heart disease of native coronary artery without angina pectoris: Secondary | ICD-10-CM | POA: Diagnosis present

## 2012-04-04 DIAGNOSIS — S71009A Unspecified open wound, unspecified hip, initial encounter: Secondary | ICD-10-CM | POA: Diagnosis present

## 2012-04-04 DIAGNOSIS — J45909 Unspecified asthma, uncomplicated: Secondary | ICD-10-CM | POA: Diagnosis present

## 2012-04-04 DIAGNOSIS — I4510 Unspecified right bundle-branch block: Secondary | ICD-10-CM

## 2012-04-04 DIAGNOSIS — E118 Type 2 diabetes mellitus with unspecified complications: Secondary | ICD-10-CM

## 2012-04-04 DIAGNOSIS — S41109A Unspecified open wound of unspecified upper arm, initial encounter: Secondary | ICD-10-CM

## 2012-04-04 DIAGNOSIS — Z79899 Other long term (current) drug therapy: Secondary | ICD-10-CM

## 2012-04-04 DIAGNOSIS — Z531 Procedure and treatment not carried out because of patient's decision for reasons of belief and group pressure: Secondary | ICD-10-CM

## 2012-04-04 DIAGNOSIS — G629 Polyneuropathy, unspecified: Secondary | ICD-10-CM | POA: Diagnosis not present

## 2012-04-04 DIAGNOSIS — E119 Type 2 diabetes mellitus without complications: Secondary | ICD-10-CM | POA: Diagnosis present

## 2012-04-04 DIAGNOSIS — R55 Syncope and collapse: Principal | ICD-10-CM | POA: Diagnosis present

## 2012-04-04 DIAGNOSIS — E785 Hyperlipidemia, unspecified: Secondary | ICD-10-CM | POA: Diagnosis present

## 2012-04-04 DIAGNOSIS — D62 Acute posthemorrhagic anemia: Secondary | ICD-10-CM | POA: Diagnosis present

## 2012-04-04 DIAGNOSIS — R000 Tachycardia, unspecified: Secondary | ICD-10-CM | POA: Diagnosis present

## 2012-04-04 DIAGNOSIS — Z8249 Family history of ischemic heart disease and other diseases of the circulatory system: Secondary | ICD-10-CM

## 2012-04-04 DIAGNOSIS — E781 Pure hyperglyceridemia: Secondary | ICD-10-CM | POA: Diagnosis present

## 2012-04-04 DIAGNOSIS — K219 Gastro-esophageal reflux disease without esophagitis: Secondary | ICD-10-CM | POA: Diagnosis present

## 2012-04-04 DIAGNOSIS — S71112A Laceration without foreign body, left thigh, initial encounter: Secondary | ICD-10-CM | POA: Diagnosis present

## 2012-04-04 DIAGNOSIS — S41112A Laceration without foreign body of left upper arm, initial encounter: Secondary | ICD-10-CM | POA: Diagnosis present

## 2012-04-04 DIAGNOSIS — S71109A Unspecified open wound, unspecified thigh, initial encounter: Secondary | ICD-10-CM | POA: Diagnosis present

## 2012-04-04 HISTORY — DX: Reserved for inherently not codable concepts without codable children: IMO0001

## 2012-04-04 HISTORY — DX: Atherosclerotic heart disease of native coronary artery without angina pectoris: I25.10

## 2012-04-04 HISTORY — DX: Procedure and treatment not carried out because of patient's decision for reasons of belief and group pressure: Z53.1

## 2012-04-04 HISTORY — DX: Myoneural disorder, unspecified: G70.9

## 2012-04-04 LAB — GLUCOSE, CAPILLARY

## 2012-04-04 LAB — CK TOTAL AND CKMB (NOT AT ARMC)
CK, MB: 3.2 ng/mL (ref 0.3–4.0)
Total CK: 168 U/L (ref 7–232)

## 2012-04-04 LAB — COMPREHENSIVE METABOLIC PANEL
CO2: 20 mEq/L (ref 19–32)
Calcium: 9 mg/dL (ref 8.4–10.5)
Creatinine, Ser: 0.93 mg/dL (ref 0.50–1.35)
GFR calc Af Amer: >90 mL/min (ref >90)
GFR calc non Af Amer: >90 mL/min (ref >90)
Glucose, Bld: 161 mg/dL — ABNORMAL HIGH (ref 70–99)
Total Bilirubin: 0.4 mg/dL (ref 0.3–1.2)

## 2012-04-04 LAB — CBC
HCT: 40.2 % (ref 39.0–52.0)
Hemoglobin: 14.1 g/dL (ref 13.0–17.0)
Hemoglobin: 12.3 g/dL — ABNORMAL LOW (ref 13.0–17.0)
MCH: 26.8 pg (ref 26.0–34.0)
MCH: 26.9 pg (ref 26.0–34.0)
MCV: 76.4 fL — ABNORMAL LOW (ref 78.0–100.0)
MCV: 76.6 fL — ABNORMAL LOW (ref 78.0–100.0)
RBC: 5.26 MIL/uL (ref 4.22–5.81)
RBC: 4.58 MIL/uL (ref 4.22–5.81)
WBC: 8.7 10*3/uL (ref 4.0–10.5)

## 2012-04-04 LAB — CARDIAC PANEL(CRET KIN+CKTOT+MB+TROPI)
Relative Index: 1 (ref 0.0–2.5)
Troponin I: <0.3 ng/mL (ref <0.30)

## 2012-04-04 LAB — PROTIME-INR: Prothrombin Time: 13.7 seconds (ref 11.6–15.2)

## 2012-04-04 MED ORDER — MORPHINE SULFATE 2 MG/ML IJ SOLN: 2.0000 mg | INTRAMUSCULAR | Status: DC | PRN

## 2012-04-04 MED ORDER — INSULIN ASPART 100 UNIT/ML ~~LOC~~ SOLN
0.0000 [IU] | Freq: Three times a day (TID) | SUBCUTANEOUS | Status: DC
  Administered 2012-04-04 – 2012-04-05 (×2): 2 [IU] via SUBCUTANEOUS

## 2012-04-04 MED ORDER — SODIUM CHLORIDE 0.9 % IV SOLN
1000.0000 mL | Freq: Once | INTRAVENOUS | Status: AC
  Administered 2012-04-04: 1000 mL via INTRAVENOUS

## 2012-04-04 MED ORDER — TETANUS-DIPHTH-ACELL PERTUSSIS 5-2.5-18.5 LF-MCG/0.5 IM SUSP
0.5000 mL | Freq: Once | INTRAMUSCULAR | Status: AC
  Administered 2012-04-04: 0.5 mL via INTRAMUSCULAR

## 2012-04-04 MED ORDER — TETANUS-DIPHTH-ACELL PERTUSSIS 5-2.5-18.5 LF-MCG/0.5 IM SUSP
INTRAMUSCULAR | Status: AC
  Filled 2012-04-04: qty 0.5

## 2012-04-04 MED ORDER — BACITRACIN ZINC 500 UNIT/GM EX OINT
TOPICAL_OINTMENT | Freq: Two times a day (BID) | CUTANEOUS | Status: DC
  Administered 2012-04-04: 1 via TOPICAL
  Administered 2012-04-04 – 2012-04-06 (×4): via TOPICAL
  Filled 2012-04-04: qty 15

## 2012-04-04 MED ORDER — METOPROLOL TARTRATE 1 MG/ML IV SOLN
2.5000 mg | Freq: Once | INTRAVENOUS | Status: AC
  Administered 2012-04-04: 2.5 mg via INTRAVENOUS
  Filled 2012-04-04: qty 5

## 2012-04-04 MED ORDER — ATORVASTATIN CALCIUM 80 MG PO TABS
80.0000 mg | ORAL_TABLET | Freq: Every day | ORAL | Status: DC
  Administered 2012-04-04 – 2012-04-05 (×2): 80 mg via ORAL
  Filled 2012-04-04 (×3): qty 1

## 2012-04-04 MED ORDER — ALBUTEROL SULFATE (5 MG/ML) 0.5% IN NEBU
2.5000 mg | INHALATION_SOLUTION | RESPIRATORY_TRACT | Status: DC | PRN
  Administered 2012-04-04 – 2012-04-06 (×3): 2.5 mg via RESPIRATORY_TRACT
  Filled 2012-04-04 (×3): qty 0.5

## 2012-04-04 MED ORDER — INSULIN ASPART 100 UNIT/ML ~~LOC~~ SOLN: 0.0000 [IU] | Freq: Every day | SUBCUTANEOUS | Status: DC

## 2012-04-04 MED ORDER — TRAMADOL-ACETAMINOPHEN 37.5-325 MG PO TABS
1.0000 | ORAL_TABLET | Freq: Four times a day (QID) | ORAL | Status: DC | PRN
  Administered 2012-04-04: 1 via ORAL
  Filled 2012-04-04 (×3): qty 1

## 2012-04-04 MED ORDER — CYCLOBENZAPRINE HCL 10 MG PO TABS: 5.0000 mg | ORAL_TABLET | Freq: Every evening | ORAL | Status: DC | PRN

## 2012-04-04 MED ORDER — SODIUM CHLORIDE 0.9 % IV SOLN
1000.0000 mL | INTRAVENOUS | Status: DC
  Administered 2012-04-04: 1000 mL via INTRAVENOUS

## 2012-04-04 MED ORDER — LISINOPRIL 2.5 MG PO TABS
2.5000 mg | ORAL_TABLET | Freq: Every day | ORAL | Status: DC
  Administered 2012-04-05 – 2012-04-06 (×2): 2.5 mg via ORAL
  Filled 2012-04-04 (×2): qty 1

## 2012-04-04 MED ORDER — SODIUM CHLORIDE 0.9 % IV SOLN
1000.0000 mL | INTRAVENOUS | Status: AC
  Administered 2012-04-04: 1000 mL via INTRAVENOUS

## 2012-04-04 MED ORDER — IOHEXOL 350 MG/ML SOLN
100.0000 mL | Freq: Once | INTRAVENOUS | Status: AC | PRN
  Administered 2012-04-04: 100 mL via INTRAVENOUS

## 2012-04-04 MED ORDER — TETANUS-DIPHTHERIA TOXOIDS TD 5-2 LFU IM INJ: 0.5000 mL | INJECTION | Freq: Once | INTRAMUSCULAR | Status: DC

## 2012-04-04 MED ORDER — AMLODIPINE BESYLATE 2.5 MG PO TABS
2.5000 mg | ORAL_TABLET | Freq: Every day | ORAL | Status: DC
  Administered 2012-04-04 – 2012-04-05 (×2): 2.5 mg via ORAL
  Filled 2012-04-04 (×2): qty 1

## 2012-04-04 MED ORDER — SODIUM CHLORIDE 0.45 % IV SOLN
INTRAVENOUS | Status: DC
  Administered 2012-04-04 – 2012-04-06 (×2): via INTRAVENOUS

## 2012-04-04 MED ORDER — SODIUM CHLORIDE 0.9 % IV BOLUS (SEPSIS)
1000.0000 mL | Freq: Once | INTRAVENOUS | Status: AC
  Administered 2012-04-04: 1000 mL via INTRAVENOUS

## 2012-04-04 MED ORDER — DICYCLOMINE HCL 20 MG PO TABS
20.0000 mg | ORAL_TABLET | Freq: Four times a day (QID) | ORAL | Status: DC | PRN
Start: 2012-04-04 — End: 2012-04-06
  Filled 2012-04-04: qty 1

## 2012-04-04 MED ORDER — PANTOPRAZOLE SODIUM 40 MG PO TBEC
40.0000 mg | DELAYED_RELEASE_TABLET | Freq: Every day | ORAL | Status: DC
  Administered 2012-04-04 – 2012-04-05 (×2): 40 mg via ORAL
  Filled 2012-04-04 (×2): qty 1

## 2012-04-04 MED ORDER — NITROGLYCERIN 0.4 MG SL SUBL: 0.4000 mg | SUBLINGUAL_TABLET | SUBLINGUAL | Status: DC | PRN

## 2012-04-04 MED ORDER — SODIUM CHLORIDE 0.9 % IJ SOLN
3.0000 mL | Freq: Two times a day (BID) | INTRAMUSCULAR | Status: DC
  Administered 2012-04-05: 3 mL via INTRAVENOUS

## 2012-04-04 NOTE — Progress Notes (Signed)
I was contacted by Dr. Jacinto Halim, as a courtesy.  He informed me about a consult on that was requested by the [facility] for his presentation with a syncopal event. He reviewed the chart, and found that [name] was originally a patient of his while a partner with [facility], but the patient has subsequently been followed by Dr. Rennis Golden with [facility] after Dr. Joelene Millin departure.  We will be happy to see the patient in consultation in the morning as he is currently stable. Please order a 2D echocardiogram & continue Telemetry monitoring overnight.   Marykay Lex, M.D., M.S. [facility] [address] [place], [place]  [address]  [phone] Pager # [phone]  04/04/2012 7:59 PM

## 2012-04-04 NOTE — ED Notes (Signed)
Pt. Reports syncopal episode for unknown reason. States he felt his leg go out, LOC. Landed on glass coffee table. Lacerations to left lower buttocks/upper thigh and left forearm. Hx of diabetes, ate around 2230. CBG 140 per EMS.

## 2012-04-04 NOTE — ED Provider Notes (Addendum)
History     CSN: [id number]  Arrival date & time 04/04/12  0140   First MD Initiated Contact with Patient 04/04/12 0144      Chief Complaint  Patient presents with  . Laceration  . Loss of Consciousness    (Consider location/radiation/quality/duration/timing/severity/associated sxs/prior treatment) Patient is a 41 y.o. male presenting with skin laceration and syncope. The history is provided by the patient.  Laceration  The incident occurred less than 1 hour ago. The laceration is located on the left arm and left leg. The laceration is 10 cm in size. The laceration mechanism was a broken glass. The pain is at a severity of 3/10. The pain is mild. The pain has been constant since onset. He reports no foreign bodies present. His tetanus status is out of date.  Loss of Consciousness This is a new problem. The current episode started less than 1 hour ago. The problem has not changed since onset.Pertinent negatives include no chest pain, no abdominal pain, no headaches and no shortness of breath. He has tried nothing for the symptoms.    Past Medical History  Diagnosis Date  . Diabetes mellitus   . Asthma   . Enlarged prostate   . Hyperlipidemia   . Heart disease   . Hypertension   . Blood transfusion   . GERD (gastroesophageal reflux disease)     Past Surgical History  Procedure Date  . Appendectomy   . Patent ductus arterious repair     at age 31  . Colonoscopy w/ polypectomy 03/17/11    diminutive polyp  . Cardiac catheterization 2007    Clean Cardiac Cath [facility] Cards), with RCA 30% narrowing likely due to catheter induced spasm in 09/02/10.    Family History  Problem Relation Age of Onset  . Colon cancer Paternal Grandfather   . Prostate cancer Paternal Grandfather   . Aneurysm Father   . Heart attack Father 71  . Coronary artery disease Father   . Colon cancer Paternal Uncle     x 2  . Colon polyps Mother   . Heart disease Mother   . Coronary artery  disease Mother   . Breast cancer Maternal Grandmother   . Stomach cancer Brother   . Liver disease      unsure who it was    History  Substance Use Topics  . Smoking status: Never Smoker   . Smokeless tobacco: Never Used  . Alcohol Use: Yes     rare      Review of Systems  Respiratory: Negative for shortness of breath.   Cardiovascular: Positive for syncope. Negative for chest pain.  Gastrointestinal: Negative for abdominal pain.  Skin: Positive for wound.  Neurological: Positive for syncope. Negative for headaches.  All other systems reviewed and are negative.    Allergies  Benadryl  Home Medications   Current Outpatient Rx  Name Route Sig Dispense Refill  . ALBUTEROL SULFATE HFA 108 (90 BASE) MCG/ACT IN AERS Inhalation Inhale 2 puffs into the lungs every 6 (six) hours as needed. Use with spacer    . ALBUTEROL SULFATE (2.5 MG/3ML) 0.083% IN NEBU Nebulization Take 3 mLs (2.5 mg total) by nebulization every 6 (six) hours as needed for wheezing. 75 mL 12  . AMLODIPINE BESYLATE 2.5 MG PO TABS Oral Take 1 tablet (2.5 mg total) by mouth daily. 30 tablet 6  . ASPIRIN 81 MG PO TABS Oral Take 81 mg by mouth daily.      . BECLOMETHASONE DIPROPIONATE  80 MCG/ACT IN AERS Inhalation Inhale 2 puffs into the lungs 2 (two) times daily. 1 Inhaler 6  . DICYCLOMINE HCL 20 MG PO TABS  1/2 - 1 tablet every 6 hours as needed for abdominal pain    . FENOFIBRATE MICRONIZED 134 MG PO CAPS Oral Take 1 capsule (134 mg total) by mouth daily before breakfast. 90 capsule 1  . GLUCOSE BLOOD VI STRP  Use to check blood sugar once a day 100 strip prn  . METFORMIN HCL 500 MG PO TABS Oral Take 1 tablet (500 mg total) by mouth daily with breakfast. 30 tablet 6  . NAPROXEN 500 MG PO TABS Oral Take 1 tablet (500 mg total) by mouth 2 (two) times daily with a meal. 30 tablet 0  . NITROGLYCERIN 0.4 MG SL SUBL Sublingual Place 1 tablet (0.4 mg total) under the tongue every 5 (five) minutes as needed for chest  pain. 20 tablet 11  . OMEPRAZOLE 40 MG PO CPDR Oral Take 1 capsule (40 mg total) by mouth daily with supper. 30 capsule 11  . PRAVASTATIN SODIUM 40 MG PO TABS Oral Take 2 tablets (80 mg total) by mouth daily. 60 tablet 5    Change in dose 08/03/2011    BP 157/107  Pulse 111  Temp 98 F (36.7 C) (Oral)  Resp 19  SpO2 100%  Physical Exam  Constitutional: He is oriented to person, place, and time. He appears well-developed and well-nourished.  HENT:  Head: Normocephalic and atraumatic.  Eyes: Conjunctivae are normal. Pupils are equal, round, and reactive to light.  Neck: Normal range of motion. Neck supple.  Cardiovascular: Normal rate, regular rhythm, normal heart sounds and intact distal pulses.   Pulmonary/Chest: Effort normal and breath sounds normal.  Abdominal: Soft. Bowel sounds are normal.  Neurological: He is alert and oriented to person, place, and time.  Skin: Skin is warm and dry.          8 cm linear laceration to left thigh 7 cm linear laceration to left foream  Psychiatric: He has a normal mood and affect. His behavior is normal. Judgment and thought content normal.    ED Course  LACERATION REPAIR Performed by: Rosanne Ashing Authorized by: Rosanne Ashing Consent: Verbal consent obtained. Risks and benefits: risks, benefits and alternatives were discussed Consent given by: patient Patient understanding: patient states understanding of the procedure being performed Patient consent: the patient's understanding of the procedure matches consent given Procedure consent: procedure consent matches procedure scheduled Imaging studies: imaging studies available Body area: upper extremity Location details: left lower arm Laceration length: 7 cm Tendon involvement: none Nerve involvement: none Vascular damage: no Anesthesia: local infiltration Local anesthetic: lidocaine 2% with epinephrine Anesthetic total: 3 ml Patient sedated: no Irrigation solution:  saline Irrigation method: jet lavage Amount of cleaning: extensive Debridement: none Degree of undermining: none Skin closure: 3-0 nylon Number of sutures: 7 Technique: simple Approximation: close Approximation difficulty: simple  LACERATION REPAIR Performed by: Rosanne Ashing Authorized by: Rosanne Ashing Consent: Verbal consent obtained. Risks and benefits: risks, benefits and alternatives were discussed Consent given by: patient Patient understanding: patient states understanding of the procedure being performed Patient consent: the patient's understanding of the procedure matches consent given Imaging studies: imaging studies available Patient identity confirmed: verbally with patient Laceration length: 8 cm Tendon involvement: none Nerve involvement: none Vascular damage: no Anesthesia: local infiltration Local anesthetic: lidocaine 2% with epinephrine Anesthetic total: 4 ml Patient sedated: no Irrigation solution: saline  Irrigation method: jet lavage Amount of cleaning: extensive Debridement: none Degree of undermining: none Skin closure: 3-0 nylon Number of sutures: 9 Technique: simple Approximation: close Approximation difficulty: simple Dressing: 4x4 sterile gauze   (including critical care time)   Labs Reviewed  CBC  COMPREHENSIVE METABOLIC PANEL  TROPONIN I  CK TOTAL AND CKMB   No results found.   No diagnosis found.   Date: 04/04/2012  Rate: 125  Rhythm: sinus tachycardia  QRS Axis: right  Intervals: normal  ST/T Wave abnormalities: normal  Conduction Disutrbances:right bundle branch block  Narrative Interpretation:   Old EKG Reviewed: changes noted   MDM  Pt noted to become diaphoertic, tachycardic on ekg.  Noted to have expanding hematoma in left lat thigh.  No discrete bleeding source noted.  Will consult surgery,  Reassess    Discussed with tseui and radiologist.  Will ct lower extremity.  Cont crystalloid resuscitation.    Pt still tachycardic.  Jehovah witness.  Denying blood and type an screen at this time.  Await ct  Discussed ct results with Tseui.  Will see pt in ed for further evaluation  Discussed with family practice,  Rich.  Happy to observe or admit pending surgical dispo.  Mekenna Finau Lytle Michaels, MD 04/04/12 0536  Layanna Charo Lytle Michaels, MD 04/04/12 7829  Bryar Dahms Lytle Michaels, MD 04/04/12 5621   Tylea Hise Lytle Michaels, MD 04/04/12 3086

## 2012-04-04 NOTE — ED Notes (Signed)
PT. WAITING FOR SURGICAL CONSULT.

## 2012-04-04 NOTE — Progress Notes (Signed)
Received pt from ED and laceration on Left buttocks was oozing blood. Dressed site with gauze. Checked site and was still bleeding. Re-dressed with gauze and notified PA. Also, HR jumped to 140, pt was asymptomatic. Pt was laughing in room with friends. After receiving metoprolol IV 2.5mg , pt's HR has remained low 100's. PA also aware of this. Will continue to monitor.

## 2012-04-04 NOTE — H&P (Signed)
Seen and examined.  Discussed with Dr. Louanne Belton.  Agree with his management.  Briefly, 41 yo male with known cardiac problems (PDA repair as infant and CAD by previous cath) presents with abrupt syncope without presyncope symptoms resulting in injury.  Also found to have new RtBBB.  If that is not enough, his mother has hypertrophic cardiomyopathy.  AND he is a Scientist, product/process development.   Buttocks injury/laceration per ortho. Syncope, very worried about cardiac arrythmia as cause.  Will monitor and involve cards.  Jehovah's Witness not an issue right now but may become an issue if needs anticoag or surgery.

## 2012-04-04 NOTE — H&P (Signed)
Family Medicine Teaching [facility] Admission History and Physical Service Pager: [phone]  Patient name: Prayan Ulin Medical record number: [id number] Date of birth: October 09, 1970 Age: 41 y.o. Gender: male  Primary Care Provider: Letitia Libra, Ala Dach, MD  Chief Complaint: Syncope  Assessment and Plan: Harjit Leider is a 41 y.o. year old male presenting with syncope and new RBBB 1. Syncope: Given the patient's new right bundle branch block I feel it a cardiac etiology of the patient's syncope is most likely. The patient is a negative troponin at this time. Do to his inability to take blood products and recent active bleeding I am hesitant to start any heparin unless we absolutely must. 1. Cycle cardiac enzymes and obtain a repeat EKG later this afternoon 2. I have consulted Dr.Ganji (cardiology) who was previously seen him 3. I will continue the patient's aspirin and will give a low-dose of IV metoprolol due to his current tachycardia 4. In relation to his tachycardia which is likely from volume depletion I will give the patient 2 L of normal saline 5. I will place the patient on telemetry and plan to monitor for 24 hours 2. New right bundle branch block: This appears to be an acute change from his last EKG on file. The patient is currently denying any chest pain and he does have a single set of negative troponins drawn several hours after the incident. Given that this is in acute change and is likely related to his syncope, I'll requested his cardiologist see him in the hospital in addition to performing cardiac rule out. 3. Hypertension: Continue the patient on his home Norvasc we'll also add low-dose lisinopril due to his diabetes. The lisinopril will be started prior to discharge due to his recent contrast load. 4. Diabetes: I will obtain an A1c at this time I will hold his metformin due to recent contrast administration. Plan to restart in 48 hours. Sliding scale insulin until  then. 5. Hyperlipidemia: Obtain a direct LDL and continue the patient's home medications. 6. Lacerations: Currently there is no evidence of active bleeding. Surgery has been consulted and will follow. 1. Follow CBCs every 4 hours for the next 12 hours 2. No blood products due to religious beliefs 3. Will avoid heparin if at all possible 7. FEN/GI: Carb modified diet, 2 L of normal saline boluses, half-normal saline at [facility] 8. Prophylaxis: SCDs 9. Disposition: Telemetry bed, inpatient status 10. Code Status: Full code  History of Present Illness: Eliza Grissinger is a 41 y.o. year old male presenting with syncope.  The patient was in his normal state of health until 1:00 this morning. He routinely has problems with falling asleep and was up walking around his house when he had a syncopal episode. This was not associated with any shortness of breath or chest pain. He does not remember the episode. He fell into a glass table in his living room and cut himself both in his left arm and his left leg. When he regained consciousness he called for his wife who reports that this was at 1 AM. EMS was called and the patient transported to the hospital. In the emergency department CT angiography of the lower remedy was performed which did not show any active bleed but did demonstrate a large hematoma in the left gluteal region. Surgery was consult and and felt that observation was most appropriate.  Of note, the patient's EKG on admission showed a new right bundle branch block. Troponins were obtained and were negative. The  patient was chest pain-free at the time of admission but given history of diabetes, coronary artery spasm, hyperlipidemia, and hypertension along with recent syncope of unknown etiology admission was felt appropriate.  At the time of my interview the patient denied any shortness of breath, chest pain, visual changes, headaches, difficulty swallowing, nausea, vomiting, diarrhea. He was having  problems with pain in his left gluteal area along with decreased sensation in his left leg.  Patient Active Problem List  Diagnosis  . Hypertriglyceridemia  . OBESITY, NOS  . ERECTILE DYSFUNCTION  . Coronary artery spasm  . ASTHMA, PERSISTENT  . DIABETES MELLITUS, TYPE II  . GERD  . Abdominal  pain, other specified site  . Palpitations  . Chronic back pain  . Dizziness  . Anxiety  . Breast mass in male  . Urethral discharge  . Dysuria   Past Medical History: Past Medical History  Diagnosis Date  . Diabetes mellitus   . Asthma   . Enlarged prostate   . Hyperlipidemia   . Heart disease   . Hypertension   . Blood transfusion   . GERD (gastroesophageal reflux disease)    Past Surgical History: Past Surgical History  Procedure Date  . Appendectomy   . Patent ductus arterious repair     at age 35  . Colonoscopy w/ polypectomy 03/17/11    diminutive polyp  . Cardiac catheterization 2007    Clean Cardiac Cath [facility] Cards), with RCA 30% narrowing likely due to catheter induced spasm in 09/02/10.   Social History: History  Substance Use Topics  . Smoking status: Never Smoker   . Smokeless tobacco: Never Used  . Alcohol Use: Yes     rare   For any additional social history documentation, please refer to relevant sections of EMR.  Family History: Family History  Problem Relation Age of Onset  . Colon cancer Paternal Grandfather   . Prostate cancer Paternal Grandfather   . Aneurysm Father   . Heart attack Father 28  . Coronary artery disease Father   . Colon cancer Paternal Uncle     x 2  . Colon polyps Mother   . Heart disease Mother   . Coronary artery disease Mother   . Breast cancer Maternal Grandmother   . Stomach cancer Brother   . Liver disease      unsure who it was   Allergies: Allergies  Allergen Reactions  . Benadryl (Diphenhydramine Hcl)     "drives me nuts"  . Red Blood Cells     PT. REFUSES ANY BLOOD PRODUCTS - JEHOVAH'S WITNESS.    No current facility-administered medications on file prior to encounter.   Current Outpatient Prescriptions on File Prior to Encounter  Medication Sig Dispense Refill  . albuterol (PROVENTIL HFA;VENTOLIN HFA) 108 (90 BASE) MCG/ACT inhaler Inhale 2 puffs into the lungs every 6 (six) hours as needed. Use with spacer      . albuterol (PROVENTIL) (2.5 MG/3ML) 0.083% nebulizer solution Take 3 mLs (2.5 mg total) by nebulization every 6 (six) hours as needed for wheezing.  75 mL  12  . amLODipine (NORVASC) 2.5 MG tablet Take 1 tablet (2.5 mg total) by mouth daily.  30 tablet  6  . dicyclomine (BENTYL) 20 MG tablet 1/2 - 1 tablet every 6 hours as needed for abdominal pain      . metFORMIN (GLUCOPHAGE) 500 MG tablet Take 1 tablet (500 mg total) by mouth daily with breakfast.  30 tablet  6  . nitroGLYCERIN (  NITROSTAT) 0.4 MG SL tablet Place 1 tablet (0.4 mg total) under the tongue every 5 (five) minutes as needed for chest pain.  20 tablet  11  . omeprazole (PRILOSEC) 40 MG capsule Take 1 capsule (40 mg total) by mouth daily with supper.  30 capsule  11  . rosuvastatin (CRESTOR) 40 MG tablet Take 40 mg by mouth daily.      Marland Kitchen glucose blood (ACCU-CHEK AVIVA PLUS) test strip Use to check blood sugar once a day  100 strip  prn  . DISCONTD: niacin (NIASPAN) 1000 MG CR tablet Take 1 tablet (1,000 mg total) by mouth at bedtime.  30 tablet  6   Review Of Systems: Per HPI.  Otherwise 12 point review of systems was performed and was unremarkable.  Physical Exam: BP 153/96  Pulse 116  Temp 98 F (36.7 C) (Oral)  Resp 14  SpO2 100% Exam: General: NAD, lying in bed, appropriate HEENT: MMM, EOMI, PERRL Cardiovascular: Tachycardic but regular, no murmurs Respiratory: CTABL Abdomen: SNDNT Extremities: RLE and RUE WNL, LEU has repaired laceration on the forearm.  No bleeding.  LLE has large repaired laceration in the gluteal area.  No active bleeding.  Pulse is slightly decreased in the LLE but is present.   Good cap refill.  Able to move extremity but hesitant to due to pain. Skin: No bruising or lacerations elsewhere Neuro: Slightly decreased strength in LLE secondary to pain, otherwise intact.  CN 2-12 intact.  Labs and Imaging: CBC BMET   Lab 04/04/12 0533  WBC 8.7  HGB 12.3*  HCT 35.1*  PLT 212    Lab 04/04/12 0242  NA 137  K 3.8  CL 101  CO2 20  BUN 18  CREATININE 0.93  GLUCOSE 161*  CALCIUM 9.0    CTA Legs: No active extravasation, possible foreign body deep in tissue, Large hematoma INR 1.03 Troponin <0.3 EKG: Sinus tachycardia, rate ~120, normal intervals.  New RBBB with T-wave inversions in anterior leads.  No ST segment changes. Wong Steadham, MD 04/04/2012, 8:02 AM

## 2012-04-04 NOTE — ED Notes (Addendum)
Surgeon and Family Practice at bedside.

## 2012-04-04 NOTE — Consult Note (Signed)
Reason for Consult: LEFT upper thigh/gluteal laceration with hematoma Referring Physician:  Donell Beers, MD  Cody Raymond is an 41 y.o. male.  HPI: 41yo male unable to sleep walking around house when he passed out (unknown etiology - currently being worked up).  He woke to find that he was bleeding from his left arm and thigh.  Brought to ER where he had his wound initially evaluated and partially closed.  He had some complaints of numbness in his left lower extremity.  Concern was raised about enlarging hematoma compressing against his sciatic nerve.  At time of this evaluation he no longer had the complaints of numbness in his extremity.  No other complaints  Past Medical History  Diagnosis Date  . Diabetes mellitus   . Asthma   . Enlarged prostate   . Hyperlipidemia   . Heart disease   . Hypertension   . Blood transfusion   . GERD (gastroesophageal reflux disease)   . Refusal of blood transfusions as patient is Jehovah's Witness   . Neuromuscular disorder     DJD    Past Surgical History  Procedure Date  . Appendectomy   . Patent ductus arterious repair     at age 11  . Colonoscopy w/ polypectomy 03/17/11    diminutive polyp  . Cardiac catheterization 2007    [facility] Cardiac Cath [facility] Cards), with RCA 30% narrowing likely due to catheter induced spasm in 09/02/10.  . Back surgery     Family History  Problem Relation Age of Onset  . Colon cancer Paternal Grandfather   . Prostate cancer Paternal Grandfather   . Aneurysm Father   . Heart attack Father 68  . Coronary artery disease Father   . Colon cancer Paternal Uncle     x 2  . Colon polyps Mother   . Heart disease Mother   . Coronary artery disease Mother   . Breast cancer Maternal Grandmother   . Stomach cancer Brother   . Liver disease      unsure who it was    Social History:  reports that he has never smoked. He has never used smokeless tobacco. He reports that he drinks alcohol. He reports that he  does not use illicit drugs.  Allergies:  Allergies  Allergen Reactions  . Benadryl (Diphenhydramine Hcl)     "drives me nuts"  . Red Blood Cells     PT. REFUSES ANY BLOOD PRODUCTS - JEHOVAH'S WITNESS.    Medications:  I have reviewed the patient's current medications. Scheduled:   . sodium chloride  1,000 mL Intravenous Once  . sodium chloride  1,000 mL Intravenous Q1 Hr x 2  . amLODipine  2.5 mg Oral Daily  . atorvastatin  80 mg Oral q1800  . bacitracin   Topical BID  . insulin aspart  0-15 Units Subcutaneous TID WC  . insulin aspart  0-5 Units Subcutaneous QHS  . lisinopril  2.5 mg Oral Daily  . metoprolol  2.5 mg Intravenous Once  . pantoprazole  40 mg Oral Q1200  . sodium chloride  1,000 mL Intravenous Once  . sodium chloride  3 mL Intravenous Q12H  . TDaP  0.5 mL Intramuscular Once  . DISCONTD: tetanus & diphtheria toxoids (adult)  0.5 mL Intramuscular Once    Results for orders placed during the hospital encounter of 04/04/12 (from the past 24 hour(s))  CBC     Status: Abnormal   Collection Time   04/04/12  2:42 AM  Component Value Range   WBC 7.8  4.0 - 10.5 K/uL   RBC 5.26  4.22 - 5.81 MIL/uL   Hemoglobin 14.1  13.0 - 17.0 g/dL   HCT 16.1  09.6 - 04.5 %   MCV 76.4 (*) 78.0 - 100.0 fL   MCH 26.8  26.0 - 34.0 pg   MCHC 35.1  30.0 - 36.0 g/dL   RDW 40.9 (*) 81.1 - 91.4 %   Platelets 199  150 - 400 K/uL  COMPREHENSIVE METABOLIC PANEL     Status: Abnormal   Collection Time   04/04/12  2:42 AM      Component Value Range   Sodium 137  135 - 145 mEq/L   Potassium 3.8  3.5 - 5.1 mEq/L   Chloride 101  96 - 112 mEq/L   CO2 20  19 - 32 mEq/L   Glucose, Bld 161 (*) 70 - 99 mg/dL   BUN 18  6 - 23 mg/dL   Creatinine, Ser 7.82  0.50 - 1.35 mg/dL   Calcium 9.0  8.4 - 95.6 mg/dL   Total Protein 7.2  6.0 - 8.3 g/dL   Albumin 3.9  3.5 - 5.2 g/dL   AST 28  0 - 37 U/L   ALT 30  0 - 53 U/L   Alkaline Phosphatase 120 (*) 39 - 117 U/L   Total Bilirubin 0.4  0.3 - 1.2  mg/dL   GFR calc non Af Amer >90  >90 mL/min   GFR calc Af Amer >90  >90 mL/min  TROPONIN I     Status: Normal   Collection Time   04/04/12  2:43 AM      Component Value Range   Troponin I <0.30  <0.30 ng/mL  CK TOTAL AND CKMB     Status: Normal   Collection Time   04/04/12  2:43 AM      Component Value Range   Total CK 168  7 - 232 U/L   CK, MB 3.2  0.3 - 4.0 ng/mL   Relative Index 1.9  0.0 - 2.5  GLUCOSE, CAPILLARY     Status: Abnormal   Collection Time   04/04/12  3:45 AM      Component Value Range   Glucose-Capillary 156 (*) 70 - 99 mg/dL  APTT     Status: Normal   Collection Time   04/04/12  5:33 AM      Component Value Range   aPTT 27  24 - 37 seconds  PROTIME-INR     Status: Normal   Collection Time   04/04/12  5:33 AM      Component Value Range   Prothrombin Time 13.7  11.6 - 15.2 seconds   INR 1.03  0.00 - 1.49  CBC     Status: Abnormal   Collection Time   04/04/12  5:33 AM      Component Value Range   WBC 8.7  4.0 - 10.5 K/uL   RBC 4.58  4.22 - 5.81 MIL/uL   Hemoglobin 12.3 (*) 13.0 - 17.0 g/dL   HCT 21.3 (*) 08.6 - 57.8 %   MCV 76.6 (*) 78.0 - 100.0 fL   MCH 26.9  26.0 - 34.0 pg   MCHC 35.0  30.0 - 36.0 g/dL   RDW 46.9 (*) 62.9 - 52.8 %   Platelets 212  150 - 400 K/uL  GLUCOSE, CAPILLARY     Status: Abnormal   Collection Time   04/04/12 11:28 AM  Component Value Range   Glucose-Capillary 143 (*) 70 - 99 mg/dL  CARDIAC PANEL(CRET KIN+CKTOT+MB+TROPI)     Status: Normal   Collection Time   04/04/12  2:54 PM      Component Value Range   Total CK 230  7 - 232 U/L   CK, MB 2.5  0.3 - 4.0 ng/mL   Troponin I <0.30  <0.30 ng/mL   Relative Index 1.1  0.0 - 2.5  GLUCOSE, CAPILLARY     Status: Abnormal   Collection Time   04/04/12  4:02 PM      Component Value Range   Glucose-Capillary 117 (*) 70 - 99 mg/dL     X-ray: Xrays of hi ship and femur were negative for fracture but did reveal hematoma  ROS: per medical admit, but no recent episodes like this, no  orthopaedic injuries  Blood pressure 103/73, pulse 103, temperature 98.8 F (37.1 C), temperature source Oral, resp. rate 18, height 5\' 8"  (1.727 m), weight 100.4 kg (221 lb 5.5 oz), SpO2 98.00%.  PE: awake alert oriented, pleasant male.  With wife in room Left upper lateral thigh wound close to gluteal cleft, clean with sutures in place.  Can see hematoma under sutures, no active bleeding or oozing No numbness left lower extremity Intact motor function   Assessment/Plan: Left posterior upper thigh hematoma Gave him option of going to OR to have it washed out and closed versus observation.  Not certain of outcome if observed but expect hematoma dissolution with drainage from wound but ultimately that it could be OK.  Daily dressing changes as needed particularly if draining  Told patient that if he decided to have it cleaned out that he could contact us via nursing or his primary team.  Thanks.  [place], (p) [phone] or (o) [phone]  Durene Romans D 04/04/2012, 8:36 PM

## 2012-04-04 NOTE — Telephone Encounter (Signed)
PATIENT IS IN HOSPITAL D[facility] AT [facility].  LAST NIGHT AROUND 12:30 HE FELL THROUGH A TABLE IN HIS LIVING ROOM.  THEY DO NOT KNOW WHY.  HE HAS A HOLE IN IS ARM AND ON HIS BUTT.  THEY ARE KEEPING HIM SINCE HE HAS LOST SO MUCH BLOOD.

## 2012-04-04 NOTE — Consult Note (Signed)
Reason for Consult:Thigh hematoma Referring Physician: Dr. Verl Bangs (EDP)  Cody Raymond is an 41 y.o. male.  HPI: Patient was in his usual state of health. He was having trouble sleeping last night and got up to read in the living room. He got up to get something to drink and on the way back had an apparent syncopal event. He woke up on the floor having broken his glass coffee table and was bleeding badly. No prior hx/o syncopal events though he does note chronic episodic dizziness and occasional palpitations. He denies any sensory changes initially but noted that his left leg began to get numb while he was here in the hospital but before the laceration was closed.  Past Medical History  Diagnosis Date  . Diabetes mellitus   . Asthma   . Enlarged prostate   . Hyperlipidemia   . Heart disease   . Hypertension   . Blood transfusion   . GERD (gastroesophageal reflux disease)     Past Surgical History  Procedure Date  . Appendectomy   . Patent ductus arterious repair     at age 7  . Colonoscopy w/ polypectomy 03/17/11    diminutive polyp  . Cardiac catheterization 2007    Clean Cardiac Cath [facility] Cards), with RCA 30% narrowing likely due to catheter induced spasm in 09/02/10.    Family History  Problem Relation Age of Onset  . Colon cancer Paternal Grandfather   . Prostate cancer Paternal Grandfather   . Aneurysm Father   . Heart attack Father 15  . Coronary artery disease Father   . Colon cancer Paternal Uncle     x 2  . Colon polyps Mother   . Heart disease Mother   . Coronary artery disease Mother   . Breast cancer Maternal Grandmother   . Stomach cancer Brother   . Liver disease      unsure who it was    Social History:  reports that he has never smoked. He has never used smokeless tobacco. He reports that he drinks alcohol. He reports that he does not use illicit drugs.  Allergies:  Allergies  Allergen Reactions  . Benadryl (Diphenhydramine Hcl)    "drives me nuts"  . Red Blood Cells     PT. REFUSES ANY BLOOD PRODUCTS - JEHOVAH'S WITNESS.    Medications: I have reviewed the patient's current medications.  Results for orders placed during the hospital encounter of 04/04/12 (from the past 48 hour(s))  CBC     Status: Abnormal   Collection Time   04/04/12  2:42 AM      Component Value Range Comment   WBC 7.8  4.0 - 10.5 K/uL    RBC 5.26  4.22 - 5.81 MIL/uL    Hemoglobin 14.1  13.0 - 17.0 g/dL    HCT 16.1  09.6 - 04.5 %    MCV 76.4 (*) 78.0 - 100.0 fL    MCH 26.8  26.0 - 34.0 pg    MCHC 35.1  30.0 - 36.0 g/dL    RDW 40.9 (*) 81.1 - 15.5 %    Platelets 199  150 - 400 K/uL   COMPREHENSIVE METABOLIC PANEL     Status: Abnormal   Collection Time   04/04/12  2:42 AM      Component Value Range Comment   Sodium 137  135 - 145 mEq/L    Potassium 3.8  3.5 - 5.1 mEq/L    Chloride 101  96 - 112 mEq/L  CO2 20  19 - 32 mEq/L    Glucose, Bld 161 (*) 70 - 99 mg/dL    BUN 18  6 - 23 mg/dL    Creatinine, Ser 9.60  0.50 - 1.35 mg/dL    Calcium 9.0  8.4 - 45.4 mg/dL    Total Protein 7.2  6.0 - 8.3 g/dL    Albumin 3.9  3.5 - 5.2 g/dL    AST 28  0 - 37 U/L    ALT 30  0 - 53 U/L    Alkaline Phosphatase 120 (*) 39 - 117 U/L    Total Bilirubin 0.4  0.3 - 1.2 mg/dL    GFR calc non Af Amer >90  >90 mL/min    GFR calc Af Amer >90  >90 mL/min   TROPONIN I     Status: Normal   Collection Time   04/04/12  2:43 AM      Component Value Range Comment   Troponin I <0.30  <0.30 ng/mL   CK TOTAL AND CKMB     Status: Normal   Collection Time   04/04/12  2:43 AM      Component Value Range Comment   Total CK 168  7 - 232 U/L    CK, MB 3.2  0.3 - 4.0 ng/mL    Relative Index 1.9  0.0 - 2.5   GLUCOSE, CAPILLARY     Status: Abnormal   Collection Time   04/04/12  3:45 AM      Component Value Range Comment   Glucose-Capillary 156 (*) 70 - 99 mg/dL   APTT     Status: Normal   Collection Time   04/04/12  5:33 AM      Component Value Range Comment   aPTT  27  24 - 37 seconds   PROTIME-INR     Status: Normal   Collection Time   04/04/12  5:33 AM      Component Value Range Comment   Prothrombin Time 13.7  11.6 - 15.2 seconds    INR 1.03  0.00 - 1.49   CBC     Status: Abnormal   Collection Time   04/04/12  5:33 AM      Component Value Range Comment   WBC 8.7  4.0 - 10.5 K/uL    RBC 4.58  4.22 - 5.81 MIL/uL    Hemoglobin 12.3 (*) 13.0 - 17.0 g/dL    HCT 09.8 (*) 11.9 - 52.0 %    MCV 76.6 (*) 78.0 - 100.0 fL DELTA CHECK NOTED   MCH 26.9  26.0 - 34.0 pg    MCHC 35.0  30.0 - 36.0 g/dL    RDW 14.7 (*) 82.9 - 15.5 %    Platelets 212  150 - 400 K/uL     Dg Chest 1 View  04/04/2012  *RADIOLOGY REPORT*  Clinical Data: Syncope.  CHEST - 1 VIEW  Comparison: 12/13/2011.  Findings: Normal heart size and pulmonary vascularity.  No focal airspace consolidation in the lungs.  No blunting of costophrenic angles.  No pneumothorax.  Focal increased density projected over the right upper mediastinum inferior to the medial head of the clavicle is nonspecific etiology.  This could potentially represent a glass fragment.  Correlation with any injury in this location is recommended. The lateral view would be useful to further localize this structure.  Old left rib fractures.  Degenerative changes in the spine.  IMPRESSION: No evidence of active pulmonary disease.  Nonspecific density projected over  the right upper mediastinum as discussed.  Original Report Authenticated By: Marlon Pel, M.D.   Dg Forearm Left  04/04/2012  *RADIOLOGY REPORT*  Clinical Data: Lacerations after falling into a glass table.  LEFT FOREARM - 2 VIEW  Comparison: Left elbow 12/31/2009  Findings: Soft tissue irregularities along the dorsal aspect of the left forearm consistent with lacerations.  No radiopaque foreign bodies are demonstrated in the soft tissues.  Underlying bones appear intact without evidence of acute fracture or subluxation of the radius or ulna.  Degenerative changes are  present in the elbow.  IMPRESSION: No acute bony abnormalities.  No radiopaque foreign bodies in the soft tissues.  Original Report Authenticated By: Marlon Pel, M.D.   Dg Hip 1 View Left  04/04/2012  *RADIOLOGY REPORT*  Clinical Data: Laceration after fall and a glass table.  LEFT HIP - 1 VIEW:  Comparison: None.  Findings: A single AP view of the left hip is obtained.  There is increased density in the soft tissues over the proximal femur are consistent with soft tissue hematoma.  No radiopaque foreign bodies identified.  No displaced fractures appreciated on single view.  IMPRESSION: No radiopaque foreign bodies identified.  Original Report Authenticated By: Marlon Pel, M.D.   Dg Femur Left  04/04/2012  *RADIOLOGY REPORT*  Clinical Data: Syncope with fall on glass table.  LEFT FEMUR - 2 VIEW  Comparison: None.  Findings: Lateral view of the femur.  Soft tissue defect and mild subcutaneous gas collection over the posterior aspect of the proximal left thigh consistent with penetrating injury.  No radiopaque foreign bodies in the soft tissues.  No evidence of acute fracture on single view.  IMPRESSION: No acute bony injury identified.  Soft tissue lacerations in the posterior upper thigh soft tissues.  No radiopaque foreign bodies.  Original Report Authenticated By: Marlon Pel, M.D.   Ct Head Wo Contrast  04/04/2012  *RADIOLOGY REPORT*  Clinical Data: Syncope.  Loss of consciousness.  Lacerations from glass table.  CT HEAD WITHOUT CONTRAST  Technique:  Contiguous axial images were obtained from the base of the skull through the vertex without contrast.  Comparison: 12/14/2011  Findings: The ventricles and sulci are symmetrical without significant effacement, displacement, or dilatation. No mass effect or midline shift. No abnormal extra-axial fluid collections. The grey-white matter junction is distinct. Basal cisterns are not effaced. No acute intracranial hemorrhage. No depressed  skull fractures.  Visualized paranasal sinuses and mastoid air cells are not opacified.  Motion artifact limits the technical quality of the examination.  IMPRESSION: No acute intracranial abnormalities.  Original Report Authenticated By: Marlon Pel, M.D.   Ct Angio Low Extrem Left W/cm &/or Wo/cm  04/04/2012  *RADIOLOGY REPORT*  Clinical Data:  Expanding hematoma over the left buttocks after fall through glass table.  CT ANGIOGRAPHY OF THE PELVIS AND BOTH LOWER EXTREMITY  Technique:  Multidetector CT imaging of the pelvis and both lower extremity was performed using the standard protocol during bolus administration of intravenous contrast. Multiplanar CT image reconstructions including MIPs were obtained to evaluate the vascular anatomy.  Contrast: OMNIPAQUE IOHEXOL 350 MG/ML SOLN  Comparison:   None.  Findings: Skin defect and soft tissue lacerations in the post oral lateral aspect of the left upper thigh arising just below the buttocks region and superficial to the posterior muscle compartment.  There is subcutaneous gas present consistent with penetrating injury.  There is infiltration in the subcutaneous fat with a focal area of  increased density measuring about 4.7 x 5.7 cm consistent with hematoma.  There is no evidence of active contrast extravasation.  There is a tiny focus of increased density at the base of the hematoma, measuring about 6 mm diameter which could represent no radiopaque foreign body such as glass fragment.  No other foreign bodies are demonstrated.  No other focal hematomas. Visualized pelvic organs appear intact.  The external iliac, common femoral, deep femoral, and superficial femoral arteries appear patent.  The bones appear intact.  No evidence of acute fracture or subluxation.   Review of the MIP images confirms the above findings.  IMPRESSION: The skin defect and soft tissue gas in the left posterior upper thigh region with associated soft tissue hematomas extending  down to the posterior muscle compartment.  No evidence of active contrast extravasation.  Tiny density in the deep aspect of the hematoma could represent small glass fragment.  Original Report Authenticated By: Marlon Pel, M.D.    Review of Systems  Constitutional: Negative for weight loss.  HENT: Negative for hearing loss, ear pain, neck pain, tinnitus and ear discharge.   Eyes: Negative for blurred vision, double vision, photophobia and pain.  Respiratory: Negative for cough, sputum production and shortness of breath.   Cardiovascular: Positive for palpitations. Negative for chest pain.  Gastrointestinal: Negative for nausea, vomiting and abdominal pain.  Genitourinary: Negative for dysuria, urgency, frequency and flank pain.  Musculoskeletal: Positive for back pain and falls. Negative for myalgias and joint pain.  Neurological: Positive for dizziness, sensory change (LLE) and loss of consciousness. Negative for tingling, focal weakness, seizures and headaches.  Endo/Heme/Allergies: Does not bruise/bleed easily.  Psychiatric/Behavioral: Negative for depression, memory loss and substance abuse. The patient is not nervous/anxious.    Blood pressure 153/96, pulse 116, temperature 98 F (36.7 C), temperature source Oral, resp. rate 14, SpO2 100.00%. Physical Exam  Vitals reviewed. Constitutional: He is oriented to person, place, and time. He appears well-developed and well-nourished. He is cooperative. No distress. Cervical collar and nasal cannula in place.  HENT:  Head: Normocephalic and atraumatic. Head is without raccoon's eyes, without Battle's sign, without abrasion, without contusion and without laceration.  Right Ear: Hearing, tympanic membrane, external ear and ear canal normal. No lacerations. No drainage or tenderness. No foreign bodies. Tympanic membrane is not perforated. No hemotympanum.  Left Ear: Hearing, tympanic membrane, external ear and ear canal normal. No  lacerations. No drainage or tenderness. No foreign bodies. Tympanic membrane is not perforated. No hemotympanum.  Nose: Nose normal. No nose lacerations, sinus tenderness, nasal deformity or nasal septal hematoma. No epistaxis.  Mouth/Throat: Uvula is midline, oropharynx is clear and moist and mucous membranes are normal. No lacerations.  Eyes: Conjunctivae, EOM and lids are normal. Pupils are equal, round, and reactive to light. Right eye exhibits no discharge. Left eye exhibits no discharge. No scleral icterus.  Neck: Trachea normal. Neck supple. No JVD present. No spinous process tenderness and no muscular tenderness present. Carotid bruit is not present. No tracheal deviation present. No thyromegaly present.  Cardiovascular: Regular rhythm, normal heart sounds and intact distal pulses.  Tachycardia present.  Exam reveals no gallop and no friction rub.   No murmur heard. Pulses:      Dorsalis pedis pulses are 2+ on the right side, and 2+ on the left side.       Posterior tibial pulses are 1+ on the right side, and 1+ on the left side.  Respiratory: Effort normal and breath sounds  normal. No respiratory distress. He has no wheezes. He has no rales. He exhibits no tenderness, no bony tenderness, no laceration and no crepitus.  GI: Soft. Normal appearance and bowel sounds are normal. He exhibits no distension. There is tenderness (BLQ). There is no rigidity, no rebound, no guarding and no CVA tenderness.  Musculoskeletal: Normal range of motion. He exhibits no edema and no tenderness.  Lymphadenopathy:    He has no cervical adenopathy.  Neurological: He is alert and oriented to person, place, and time. He has normal strength. No cranial nerve deficit or sensory deficit. GCS eye subscore is 4. GCS verbal subscore is 5. GCS motor subscore is 6.  Skin: Skin is warm and dry. Laceration noted. He is not diaphoretic.     Psychiatric: He has a normal mood and affect. His speech is normal and behavior is  normal.    Assessment/Plan: Syncope w/fall -- Agree with IM admit for his syncopal w/u. Early test result point to cardiac etiology. LUE laceration -- Closed by EDP. LLE laceration w/hematoma, sensory change -- Given history I expect paresthesia is secondary to neural compression from the hematoma though direct trauma may have been masked by state of mind immediately after event. This may be amenable to evacuation. Dr. Charlann Boxer has been asked to consult on this issue. ABL anemia -- Active bleeding appears to have stopped. 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 Suggest cessation of ASA for now and can likely restart in a couple of weeks. Multiple medical problems -- per primary service  We will follow along for now.    Freeman Caldron, PA-C Pager: [phone] General Trauma PA Pager: [phone] 04/04/2012, 9:39 AM   Await input from Dr. Charlann Boxer.  Will likely need evacuation of hematoma.  After discussion, pt was VERY reluctant to take recommendation to go to OR.  Wanted opinion from ortho prior to final decision.

## 2012-04-04 NOTE — ED Notes (Signed)
Per EMS, pt had a syncopal episode of unknown origin. Larey Seat and landed on glass coffee table. Laceration to left lower buttocks/upper thigh and to left forearm. EMS applied pressure dressings. CBG 140. Pt. Cody Raymond witness and does not want blood products.

## 2012-04-04 NOTE — ED Notes (Signed)
Diabetic meal tray ordered for patient

## 2012-04-05 ENCOUNTER — Encounter (HOSPITAL_COMMUNITY): Payer: Self-pay | Admitting: Cardiology

## 2012-04-05 ENCOUNTER — Inpatient Hospital Stay (HOSPITAL_COMMUNITY): Payer: Managed Care, Other (non HMO)

## 2012-04-05 DIAGNOSIS — I2510 Atherosclerotic heart disease of native coronary artery without angina pectoris: Secondary | ICD-10-CM

## 2012-04-05 HISTORY — DX: Atherosclerotic heart disease of native coronary artery without angina pectoris: I25.10

## 2012-04-05 LAB — LDL CHOLESTEROL, DIRECT: Direct LDL: 40 mg/dL

## 2012-04-05 LAB — HEMOGLOBIN A1C
Hgb A1c MFr Bld: 5.9 % — ABNORMAL HIGH (ref <5.7)
Mean Plasma Glucose: 123 mg/dL — ABNORMAL HIGH (ref <117)

## 2012-04-05 LAB — GLUCOSE, CAPILLARY
Glucose-Capillary: 138 mg/dL — ABNORMAL HIGH (ref 70–99)
Glucose-Capillary: 115 mg/dL — ABNORMAL HIGH (ref 70–99)
Glucose-Capillary: 121 mg/dL — ABNORMAL HIGH (ref 70–99)

## 2012-04-05 LAB — CBC
HCT: 32 % — ABNORMAL LOW (ref 39.0–52.0)
MCV: 76.7 fL — ABNORMAL LOW (ref 78.0–100.0)
Platelets: 182 10*3/uL (ref 150–400)
RBC: 4.17 MIL/uL — ABNORMAL LOW (ref 4.22–5.81)
WBC: 8 10*3/uL (ref 4.0–10.5)

## 2012-04-05 MED ORDER — SODIUM CHLORIDE 0.9 % IR SOLN
80.0000 mg | Status: DC
  Filled 2012-04-05: qty 2

## 2012-04-05 MED ORDER — CEFAZOLIN SODIUM-DEXTROSE 2-3 GM-% IV SOLR: 2.0000 g | INTRAVENOUS | Status: DC

## 2012-04-05 MED ORDER — CHLORHEXIDINE GLUCONATE 4 % EX LIQD
60.0000 mL | Freq: Once | CUTANEOUS | Status: AC
  Administered 2012-04-05: 4 via TOPICAL
  Filled 2012-04-05: qty 60

## 2012-04-05 MED ORDER — CHLORHEXIDINE GLUCONATE 4 % EX LIQD: 60.0000 mL | Freq: Once | CUTANEOUS | Status: DC

## 2012-04-05 MED ORDER — CEFAZOLIN SODIUM-DEXTROSE 2-3 GM-% IV SOLR
2.0000 g | INTRAVENOUS | Status: DC
  Filled 2012-04-05: qty 50

## 2012-04-05 MED ORDER — SODIUM CHLORIDE 0.9 % IR SOLN: 80.0000 mg | Status: DC

## 2012-04-05 MED ORDER — INSULIN ASPART 100 UNIT/ML ~~LOC~~ SOLN
0.0000 [IU] | SUBCUTANEOUS | Status: DC
  Administered 2012-04-06: 2 [IU] via SUBCUTANEOUS

## 2012-04-05 MED ORDER — CHLORHEXIDINE GLUCONATE 4 % EX LIQD
60.0000 mL | Freq: Once | CUTANEOUS | Status: AC
  Administered 2012-04-06: 4 via TOPICAL
  Filled 2012-04-05: qty 60

## 2012-04-05 MED ORDER — SODIUM CHLORIDE 0.9 % IJ SOLN
3.0000 mL | Freq: Two times a day (BID) | INTRAMUSCULAR | Status: DC
  Administered 2012-04-05 – 2012-04-06 (×2): 3 mL via INTRAVENOUS

## 2012-04-05 MED ORDER — SODIUM CHLORIDE 0.45 % IV SOLN
INTRAVENOUS | Status: DC
  Administered 2012-04-06: 06:00:00 via INTRAVENOUS

## 2012-04-05 MED ORDER — METOPROLOL TARTRATE 12.5 MG HALF TABLET
12.5000 mg | ORAL_TABLET | Freq: Two times a day (BID) | ORAL | Status: DC
  Administered 2012-04-05 – 2012-04-06 (×2): 12.5 mg via ORAL
  Filled 2012-04-05 (×3): qty 1

## 2012-04-05 MED ORDER — SODIUM CHLORIDE 0.9 % IV SOLN: 250.0000 mL | INTRAVENOUS | Status: DC

## 2012-04-05 MED ORDER — FERROUS SULFATE 325 (65 FE) MG PO TABS
325.0000 mg | ORAL_TABLET | Freq: Every day | ORAL | Status: DC
  Administered 2012-04-06: 325 mg via ORAL
  Filled 2012-04-05 (×2): qty 1

## 2012-04-05 MED ORDER — SODIUM CHLORIDE 0.9 % IJ SOLN: 3.0000 mL | INTRAMUSCULAR | Status: DC | PRN

## 2012-04-05 NOTE — Care Management Note (Unsigned)
    Page 1 of 1   04/05/2012     10:42:58 AM   CARE MANAGEMENT NOTE 04/05/2012  Patient:  Cody Raymond, Cody Raymond   Account Number:  [id number]  Date Initiated:  04/05/2012  Documentation initiated by:  SIMMONS,Alaria Oconnor  Subjective/Objective Assessment:   ADMITTED WITH SYNCOPE; LIVES AT HOME WITH WIFE AND 2 DAUGHTERS; AMBULATES WITH A CANE D/T PREVIOUS BACK SURGERY 8 MONTHS AGO; USES [facility] ON [address] FOR MEDS.     Action/Plan:   DISCHARGE PLANNING DISCUSSED AT BEDSIDE.   Anticipated DC Date:  04/06/2012   Anticipated DC Plan:  HOME/SELF CARE      DC Planning Services  CM consult      Choice offered to / List presented to:             Status of service:  In process, will continue to follow Medicare Important Message given?   (If response is "NO", the following Medicare IM given date fields will be blank) Date Medicare IM given:   Date Additional Medicare IM given:    Discharge Disposition:    Per UR Regulation:  Reviewed for med. necessity/level of care/duration of stay  If discussed at Long Length of Stay Meetings, dates discussed:    Comments:  04/05/12  1042  Anastasya Jewell SIMMONS RN, BSN [phone] NCM WILL FOLLOW.

## 2012-04-05 NOTE — Consult Note (Signed)
Reason for Consult: syncope, new RBBB,  PDA repair as child   Referring Physician:  Family Practice TS  Cody Raymond is an 41 y.o. male.    Chief Complaint: syncope   HPI: Cody Raymond is a 41 y.o. year old male presenting with syncope.  The patient was in his normal state of health until 1:00 the morning of 04/04/12. He routinely has problems with falling asleep and was up walking around his house when he had a syncopal episode. This was not associated with any shortness of breath or chest pain. He does not remember the episode. He fell into a glass table in his living room and cut himself both in his left arm and his left leg. When he regained consciousness he called for his wife who reports that this was at 1 AM. EMS was called and the patient transported to the hospital. In the emergency department CT angiography of the lower remedy was performed which did not show any active bleed but did demonstrate a large hematoma in the left gluteal region. Surgery was consult and and felt that observation was most appropriate.   Of note, the patient's EKG on admission showed a new right bundle branch block. Troponins were obtained and were negative. The patient was chest pain-free at the time of admission but given history of diabetes, coronary artery spasm, hyperlipidemia, and hypertension along with recent syncope of unknown etiology admission was felt appropriate.  Cardiology was asked to consult.  Pt had cardiac cath 2011 with only 30-40% stenosis and spasm, likely catheter induced.  Also 30% LCX stenosis. Also PDA repair at age 84 year.   Pt wore cardionet monitor in 2011 and had Tachycardia and PVCs.   Pt stated he is fatigued most of time.  Coming from the Kitchen to his living room tires him completely.    Currently no chest pain.  No Sob. He has been in S. Tach with HR up to 144 at times.  With shaving this am, HR up in 120's. Once he sat down he was aware of fast HR.   Past Medical  History  Diagnosis Date  . Diabetes mellitus   . Asthma   . Enlarged prostate   . Hyperlipidemia   . Heart disease   . Hypertension   . Blood transfusion   . GERD (gastroesophageal reflux disease)   . Refusal of blood transfusions as patient is Jehovah's Witness   . Neuromuscular disorder     DJD  . Coronary artery disease   . CAD (coronary artery disease), non obstructive on cath 2011 04/05/2012    Past Surgical History  Procedure Date  . Appendectomy   . Patent ductus arterious repair     at age 84  . Colonoscopy w/ polypectomy 03/17/11    diminutive polyp  . Cardiac catheterization 2007    [facility] Cards), with RCA 30% narrowing likely due to catheter induced spasm in 09/02/10.  . Back surgery     Family History  Problem Relation Age of Onset  . Colon cancer Paternal Grandfather   . Prostate cancer Paternal Grandfather   . Aneurysm Father   . Heart attack Father 65  . Coronary artery disease Father   . Colon cancer Paternal Uncle     x 2  . Colon polyps Mother   . Heart disease Mother   . Coronary artery disease Mother   . Breast cancer Maternal Grandmother   . Stomach cancer Brother   . Liver  disease      unsure who it was   Social History:  reports that he has never smoked. He has never used smokeless tobacco. He reports that he drinks alcohol. He reports that he does not use illicit drugs. Married. 2 children.  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  Allergies:  Allergies  Allergen Reactions  . Benadryl (Diphenhydramine Hcl)     "drives me nuts"  . Red Blood Cells     PT. REFUSES ANY BLOOD PRODUCTS - JEHOVAH'S WITNESS.    Medications Prior to Admission  Medication Sig Dispense Refill  . albuterol (PROVENTIL HFA;VENTOLIN HFA) 108 (90 BASE) MCG/ACT inhaler Inhale 2 puffs into the lungs every 6 (six) hours as needed. Use with spacer      . albuterol (PROVENTIL) (2.5 MG/3ML) 0.083% nebulizer  solution Take 3 mLs (2.5 mg total) by nebulization every 6 (six) hours as needed for wheezing.  75 mL  12  . amLODipine (NORVASC) 2.5 MG tablet Take 1 tablet (2.5 mg total) by mouth daily.  30 tablet  6  . aspirin 325 MG tablet Take 325 mg by mouth daily.      . calcium carbonate (TUMS - DOSED IN MG ELEMENTAL CALCIUM) 500 MG chewable tablet Chew 2 tablets by mouth daily as needed.      . cyclobenzaprine (FLEXERIL) 5 MG tablet Take 5-10 mg by mouth at bedtime as needed.      . dicyclomine (BENTYL) 20 MG tablet 1/2 - 1 tablet every 6 hours as needed for abdominal pain      . doxycycline (VIBRAMYCIN) 100 MG capsule Take 100 mg by mouth 2 (two) times daily.      . fish oil-omega-3 fatty acids 1000 MG capsule Take 1 g by mouth 2 (two) times daily.      Marland Kitchen levofloxacin (LEVAQUIN) 500 MG tablet Take 500 mg by mouth daily.      . metFORMIN (GLUCOPHAGE) 500 MG tablet Take 1 tablet (500 mg total) by mouth daily with breakfast.  30 tablet  6  . nitroGLYCERIN (NITROSTAT) 0.4 MG SL tablet Place 1 tablet (0.4 mg total) under the tongue every 5 (five) minutes as needed for chest pain.  20 tablet  11  . omeprazole (PRILOSEC) 40 MG capsule Take 1 capsule (40 mg total) by mouth daily with supper.  30 capsule  11  . rosuvastatin (CRESTOR) 40 MG tablet Take 40 mg by mouth daily.      . traMADol (ULTRAM) 50 MG tablet Take 50 mg by mouth every 6 (six) hours as needed.      . traMADol-acetaminophen (ULTRACET) 37.5-325 MG per tablet Take 1 tablet by mouth every 6 (six) hours as needed.      Marland Kitchen glucose blood (ACCU-CHEK AVIVA PLUS) test strip Use to check blood sugar once a day  100 strip  prn    Results for orders placed during the hospital encounter of 04/04/12 (from the past 48 hour(s))  CBC     Status: Abnormal   Collection Time   04/04/12  2:42 AM      Component Value Range Comment   WBC 7.8  4.0 - 10.5 K/uL    RBC 5.26  4.22 - 5.81 MIL/uL    Hemoglobin 14.1  13.0 - 17.0 g/dL    HCT 16.1  09.6 - 04.5 %    MCV  76.4 (*) 78.0 - 100.0 fL    MCH 26.8  26.0 - 34.0 pg  MCHC 35.1  30.0 - 36.0 g/dL    RDW 16.1 (*) 09.6 - 15.5 %    Platelets 199  150 - 400 K/uL   COMPREHENSIVE METABOLIC PANEL     Status: Abnormal   Collection Time   04/04/12  2:42 AM      Component Value Range Comment   Sodium 137  135 - 145 mEq/L    Potassium 3.8  3.5 - 5.1 mEq/L    Chloride 101  96 - 112 mEq/L    CO2 20  19 - 32 mEq/L    Glucose, Bld 161 (*) 70 - 99 mg/dL    BUN 18  6 - 23 mg/dL    Creatinine, Ser 0.45  0.50 - 1.35 mg/dL    Calcium 9.0  8.4 - 40.9 mg/dL    Total Protein 7.2  6.0 - 8.3 g/dL    Albumin 3.9  3.5 - 5.2 g/dL    AST 28  0 - 37 U/L    ALT 30  0 - 53 U/L    Alkaline Phosphatase 120 (*) 39 - 117 U/L    Total Bilirubin 0.4  0.3 - 1.2 mg/dL    GFR calc non Af Amer >90  >90 mL/min    GFR calc Af Amer >90  >90 mL/min   TROPONIN I     Status: Normal   Collection Time   04/04/12  2:43 AM      Component Value Range Comment   Troponin I <0.30  <0.30 ng/mL   CK TOTAL AND CKMB     Status: Normal   Collection Time   04/04/12  2:43 AM      Component Value Range Comment   Total CK 168  7 - 232 U/L    CK, MB 3.2  0.3 - 4.0 ng/mL    Relative Index 1.9  0.0 - 2.5   GLUCOSE, CAPILLARY     Status: Abnormal   Collection Time   04/04/12  3:45 AM      Component Value Range Comment   Glucose-Capillary 156 (*) 70 - 99 mg/dL   APTT     Status: Normal   Collection Time   04/04/12  5:33 AM      Component Value Range Comment   aPTT 27  24 - 37 seconds   PROTIME-INR     Status: Normal   Collection Time   04/04/12  5:33 AM      Component Value Range Comment   Prothrombin Time 13.7  11.6 - 15.2 seconds    INR 1.03  0.00 - 1.49   CBC     Status: Abnormal   Collection Time   04/04/12  5:33 AM      Component Value Range Comment   WBC 8.7  4.0 - 10.5 K/uL    RBC 4.58  4.22 - 5.81 MIL/uL    Hemoglobin 12.3 (*) 13.0 - 17.0 g/dL    HCT 81.1 (*) 91.4 - 52.0 %    MCV 76.6 (*) 78.0 - 100.0 fL DELTA CHECK NOTED   MCH 26.9   26.0 - 34.0 pg    MCHC 35.0  30.0 - 36.0 g/dL    RDW 78.2 (*) 95.6 - 15.5 %    Platelets 212  150 - 400 K/uL   GLUCOSE, CAPILLARY     Status: Abnormal   Collection Time   04/04/12 11:28 AM      Component Value Range Comment   Glucose-Capillary 143 (*) 70 -  99 mg/dL   CARDIAC PANEL(CRET KIN+CKTOT+MB+TROPI)     Status: Normal   Collection Time   04/04/12  2:54 PM      Component Value Range Comment   Total CK 230  7 - 232 U/L    CK, MB 2.5  0.3 - 4.0 ng/mL    Troponin I <0.30  <0.30 ng/mL    Relative Index 1.1  0.0 - 2.5   HEMOGLOBIN A1C     Status: Abnormal   Collection Time   04/04/12  3:15 PM      Component Value Range Comment   Hemoglobin A1C 5.9 (*) <5.7 %    Mean Plasma Glucose 123 (*) <117 mg/dL   LDL CHOLESTEROL, DIRECT     Status: Normal   Collection Time   04/04/12  3:15 PM      Component Value Range Comment   Direct LDL 40     GLUCOSE, CAPILLARY     Status: Abnormal   Collection Time   04/04/12  4:02 PM      Component Value Range Comment   Glucose-Capillary 117 (*) 70 - 99 mg/dL   CARDIAC PANEL(CRET KIN+CKTOT+MB+TROPI)     Status: Abnormal   Collection Time   04/04/12  8:34 PM      Component Value Range Comment   Total CK 239 (*) 7 - 232 U/L    CK, MB 2.3  0.3 - 4.0 ng/mL    Troponin I <0.30  <0.30 ng/mL    Relative Index 1.0  0.0 - 2.5   GLUCOSE, CAPILLARY     Status: Abnormal   Collection Time   04/04/12  9:33 PM      Component Value Range Comment   Glucose-Capillary 111 (*) 70 - 99 mg/dL    Comment 1 Documented in Chart      Comment 2 Notify RN     CARDIAC PANEL(CRET KIN+CKTOT+MB+TROPI)     Status: Normal   Collection Time   04/05/12  2:48 AM      Component Value Range Comment   Total CK 196  7 - 232 U/L    CK, MB 2.1  0.3 - 4.0 ng/mL    Troponin I <0.30  <0.30 ng/mL    Relative Index 1.1  0.0 - 2.5   CBC     Status: Abnormal   Collection Time   04/05/12  2:48 AM      Component Value Range Comment   WBC 8.0  4.0 - 10.5 K/uL    RBC 4.17 (*) 4.22 - 5.81  MIL/uL    Hemoglobin 10.9 (*) 13.0 - 17.0 g/dL    HCT 13.0 (*) 86.5 - 52.0 %    MCV 76.7 (*) 78.0 - 100.0 fL    MCH 26.1  26.0 - 34.0 pg    MCHC 34.1  30.0 - 36.0 g/dL    RDW 78.4 (*) 69.6 - 15.5 %    Platelets 182  150 - 400 K/uL   GLUCOSE, CAPILLARY     Status: Abnormal   Collection Time   04/05/12  6:16 AM      Component Value Range Comment   Glucose-Capillary 138 (*) 70 - 99 mg/dL    Dg Chest 1 View  2/95/2841  *RADIOLOGY REPORT*  Clinical Data: Syncope.  CHEST - 1 VIEW  Comparison: 12/13/2011.  Findings: Normal heart size and pulmonary vascularity.  No focal airspace consolidation in the lungs.  No blunting of costophrenic angles.  No pneumothorax.  Focal increased density  projected over the right upper mediastinum inferior to the medial head of the clavicle is nonspecific etiology.  This could potentially represent a glass fragment.  Correlation with any injury in this location is recommended. The lateral view would be useful to further localize this structure.  Old left rib fractures.  Degenerative changes in the spine.  IMPRESSION: No evidence of active pulmonary disease.  Nonspecific density projected over the right upper mediastinum as discussed.  Original Report Authenticated By: Marlon Pel, M.D.   Dg Forearm Left  04/04/2012  *RADIOLOGY REPORT*  Clinical Data: Lacerations after falling into a glass table.  LEFT FOREARM - 2 VIEW  Comparison: Left elbow 12/31/2009  Findings: Soft tissue irregularities along the dorsal aspect of the left forearm consistent with lacerations.  No radiopaque foreign bodies are demonstrated in the soft tissues.  Underlying bones appear intact without evidence of acute fracture or subluxation of the radius or ulna.  Degenerative changes are present in the elbow.  IMPRESSION: No acute bony abnormalities.  No radiopaque foreign bodies in the soft tissues.  Original Report Authenticated By: Marlon Pel, M.D.   Dg Hip 1 View Left  04/04/2012   *RADIOLOGY REPORT*  Clinical Data: Laceration after fall and a glass table.  LEFT HIP - 1 VIEW:  Comparison: None.  Findings: A single AP view of the left hip is obtained.  There is increased density in the soft tissues over the proximal femur are consistent with soft tissue hematoma.  No radiopaque foreign bodies identified.  No displaced fractures appreciated on single view.  IMPRESSION: No radiopaque foreign bodies identified.  Original Report Authenticated By: Marlon Pel, M.D.   Dg Femur Left  04/04/2012  *RADIOLOGY REPORT*  Clinical Data: Syncope with fall on glass table.  LEFT FEMUR - 2 VIEW  Comparison: None.  Findings: Lateral view of the femur.  Soft tissue defect and mild subcutaneous gas collection over the posterior aspect of the proximal left thigh consistent with penetrating injury.  No radiopaque foreign bodies in the soft tissues.  No evidence of acute fracture on single view.  IMPRESSION: No acute bony injury identified.  Soft tissue lacerations in the posterior upper thigh soft tissues.  No radiopaque foreign bodies.  Original Report Authenticated By: Marlon Pel, M.D.   Ct Head Wo Contrast  04/04/2012  *RADIOLOGY REPORT*  Clinical Data: Syncope.  Loss of consciousness.  Lacerations from glass table.  CT HEAD WITHOUT CONTRAST  Technique:  Contiguous axial images were obtained from the base of the skull through the vertex without contrast.  Comparison: 12/14/2011  Findings: The ventricles and sulci are symmetrical without significant effacement, displacement, or dilatation. No mass effect or midline shift. No abnormal extra-axial fluid collections. The grey-white matter junction is distinct. Basal cisterns are not effaced. No acute intracranial hemorrhage. No depressed skull fractures.  Visualized paranasal sinuses and mastoid air cells are not opacified.  Motion artifact limits the technical quality of the examination.  IMPRESSION: No acute intracranial abnormalities.  Original  Report Authenticated By: Marlon Pel, M.D.   Ct Angio Low Extrem Left W/cm &/or Wo/cm  04/04/2012  *RADIOLOGY REPORT*  Clinical Data:  Expanding hematoma over the left buttocks after fall through glass table.  CT ANGIOGRAPHY OF THE PELVIS AND BOTH LOWER EXTREMITY  Technique:  Multidetector CT imaging of the pelvis and both lower extremity was performed using the standard protocol during bolus administration of intravenous contrast. Multiplanar CT image reconstructions including MIPs were obtained to evaluate the  vascular anatomy.  Contrast: OMNIPAQUE IOHEXOL 350 MG/ML SOLN  Comparison:   None.  Findings: Skin defect and soft tissue lacerations in the post oral lateral aspect of the left upper thigh arising just below the buttocks region and superficial to the posterior muscle compartment.  There is subcutaneous gas present consistent with penetrating injury.  There is infiltration in the subcutaneous fat with a focal area of increased density measuring about 4.7 x 5.7 cm consistent with hematoma.  There is no evidence of active contrast extravasation.  There is a tiny focus of increased density at the base of the hematoma, measuring about 6 mm diameter which could represent no radiopaque foreign body such as glass fragment.  No other foreign bodies are demonstrated.  No other focal hematomas. Visualized pelvic organs appear intact.  The external iliac, common femoral, deep femoral, and superficial femoral arteries appear patent.  The bones appear intact.  No evidence of acute fracture or subluxation.   Review of the MIP images confirms the above findings.  IMPRESSION: The skin defect and soft tissue gas in the left posterior upper thigh region with associated soft tissue hematomas extending down to the posterior muscle compartment.  No evidence of active contrast extravasation.  Tiny density in the deep aspect of the hematoma could represent small glass fragment.  Original Report Authenticated By:  Marlon Pel, M.D.    ROS: General:no colds or fevers, no chest pain, + fatigue Skin:no rashes or ulcers or wounds prior to syncope HEENT:no blurred vision CV:No chest pain PUL:no SOB GI:no diarrhea no constipation no melena GU:no hematuria MS:no joit pain, but now with increased back pain with hx. Of surgery several months ago Neuro:no dizziness or lightheadedness prior to syncope, does feel dizzy after taking Ultram Endo:+ DM   Blood pressure 117/81, pulse 89, temperature 98.1 F (36.7 C), temperature source Oral, resp. rate 19, height 5\' 8"  (1.727 m), weight 99.1 kg (218 lb 7.6 oz), SpO2 98.00%. PE: General:alert and oriented no complaints Skin:warm and dry, brisk capillary refill. HEENT:normocephalic, sclera clear Neck:supple, no JVD, no Bruits Heart:S1S2 RRR, tachycardic Lungs:clear without rales, rhonchi or wheezes Abd:+ BS, soft non tender Ext:no edema,2+ pedal pulses Neuro:alert, oriented X 3 MAE, follows commands    Assessment/Plan Patient Active Problem List  Diagnosis  . Hypertriglyceridemia  . OBESITY, NOS  . ERECTILE DYSFUNCTION  . Coronary artery spasm  . ASTHMA, PERSISTENT  . DIABETES MELLITUS, TYPE II  . GERD  . Abdominal  pain, other specified site  . Palpitations  . Chronic back pain  . Dizziness  . Anxiety  . Breast mass in male  . Syncope  . Right bundle branch block  . Laceration of left upper arm without mention of complication  . Acute blood loss anemia  . Laceration of left thigh with complication  . Neuropathy of left lower extremity  . CAD (coronary artery disease), non obstructive on cath 2011  INAPPROPRIATE S. TACH   PLAN:continues with S. Tach,  With rest HR in 90's.   New RBBB though on 2011 EKG he had incomplete RBBB. Will recheck EKG now.  Cardiac enzymes are negative.  2D Echo is pending.  May need loop recorder to further eval for arrhythmias,   Cody Raymond 04/05/2012, 7:51 AM

## 2012-04-05 NOTE — Progress Notes (Signed)
  Echocardiogram 2D Echocardiogram has been performed.  Lauraine Crespo FRANCES 04/05/2012, 9:55 AM

## 2012-04-05 NOTE — Progress Notes (Signed)
Patient ID: Cody Raymond, male   DOB: [birth year], 41 y.o.   MRN: [id number] Seen and examined.  Bouts of tachycardia on monitor.  No syncope or lightheadedness.  Echo shows concentric hypertrophy and Grade 1 diastolic dysfunction.  By my read, this is less worrisome than hypertrophic cardiomyopathy.  Await cards recs but may be able to DC with event monitor.

## 2012-04-05 NOTE — Consult Note (Addendum)
Pt. Seen and examined. Agree with the NP/PA-C note as written.  Patient known to me for a history of palpitations in the past and atypical chest pain. Prior cardiac cath showed a 40% LAD lesion, but little else. It sounds like he had a true syncopal event. In the past he has worn a monitor which revealed PVC's and he does continue to complain of palpitations. I'm concerned he had a ventricular arrythmia. Echo shows concentric hypertrophy, but is not concerning for HOCM.  I would recommend discontinuing his Norvasc and starting low dose metoprolol 12.5 mg BID. We discussed the need to try to identify ventricular arrhythmias, which could be accomplished with a loop recorder. He is agreeable to loop recorder placement tomorrow with Dr. Royann Shivers if we can arrange it.  Thank you for consulting [place]. Will follow with you.  Chrystie Nose, MD, [facility] Attending Cardiologist [facility]

## 2012-04-05 NOTE — Progress Notes (Signed)
PGY-1 Daily Progress Note Family Medicine Teaching Service Cody Curia, MD Service Pager: [phone]   Subjective: Patient reports walking around room; complains of some abdominal pain and lightheadedness, as well as left elbow pain.  No acute events overnight.  Objective:  VITALS Temp:  [98.1 F (36.7 C)-98.8 F (37.1 C)] 98.7 F (37.1 C) (07/18 1332) Pulse Rate:  [89-110] 110  (07/18 1332) Resp:  [18-19] 18  (07/18 1332) BP: (101-117)/(69-81) 116/78 mmHg (07/18 1332) SpO2:  [97 %-98 %] 98 % (07/18 1332) Weight:  [218 lb 7.6 oz (99.1 kg)] 218 lb 7.6 oz (99.1 kg) (07/18 0454)  In/Out  Intake/Output Summary (Last 24 hours) at 04/05/12 1621 Last data filed at 04/04/12 2020  Gross per 24 hour  Intake    320 ml  Output      1 ml  Net    319 ml    Physical Exam: General: NAD, lying in bed, appropriate  HEENT: MMM, EOMI, PERRL  Cardiovascular: Tachycardic but regular, no murmurs; chest with no laceration  Respiratory: CTAB, no wheezes, rales, ronchi Abdomen: soft, nontender, nondistended Extremities: LUE has repaired laceration, No active bleeding on extremities. Able to move extremity but hesitant to due to pain.  Neuro: Alert and oriented x 3 Back: No laceration; old scar upper left side  MEDS - reviewed  Labs and imaging:   CBC  Lab 04/05/12 0248 04/04/12 0533 04/04/12 0242  WBC 8.0 8.7 7.8  HGB 10.9* 12.3* 14.1  HCT 32.0* 35.1* 40.2  PLT 182 212 199   BMET/CMET  Lab 04/04/12 0242  NA 137  K 3.8  CL 101  CO2 20  BUN 18  CREATININE 0.93  CALCIUM 9.0  PROT 7.2  BILITOT 0.4  ALKPHOS 120*  ALT 30  AST 28  GLUCOSE 161*   Dg Chest 1 View  04/04/2012  *RADIOLOGY REPORT*  Clinical Data: Syncope.  CHEST - 1 VIEW    IMPRESSION: No evidence of active pulmonary disease.  Nonspecific density projected over the right upper mediastinum as discussed.  Original Report Authenticated By: Marlon Pel, M.D.   Dg Chest 2 View  04/05/2012  *RADIOLOGY REPORT*   CHEST - 2 VIEW    IMPRESSION:  1.  No acute abnormalities. 2.  Anatomic variant of an azygos fissure of the right lung apex medially. 3.  Evidence of prior left chest surgery with rib deformities and adjacent soft tissue calcifications, stable.  Original Report Authenticated By: Gwynn Burly, M.D.    7/18 Echocardiogram -  Study Conclusions  - Procedure narrative: Transthoracic echocardiography. Image quality was adequate. The study was technically difficult, as a result of restricted patient mobility. - Left ventricle: The cavity size was normal. There was moderate concentric hypertrophy. Systolic function was vigorous. The estimated ejection fraction was in the range of 65% to 70%. Doppler parameters are consistent with abnormal left ventricular relaxation (grade 1 diastolic dysfunction). The E/e' ratio is <10, suggesting normal LV filling pressure. - Mitral valve: Trivial regurgitation. - Left atrium: The atrium was normal in size. - Systemic veins: The IVC is not well visualized. Transthoracic echocardiography. M-mode, complete 2D, spectral Doppler, and color Doppler. Height: Height: 172.7cm. Height: 68in. Weight: Weight: 99.1kg. Weight: 218lb. Body mass index: BMI: 33.2kg/m^2. Body surface area: BSA: 2.11m^2. Blood pressure: 117/81. Patient status: Inpatient. Location: Echo laboratory.     Assessment  Cody Raymond is a 41 y.o. year old male presenting with syncope and new RBBB     Plan:  1. Syncope: Given  the patient's new right bundle branch block I feel that a cardiac etiology of the patient's syncope is most likely. Pt has negative troponin x 3. Due to his inability to take blood products and recent active bleeding I am hesitant to start any heparin unless we absolutely must.  EKG shows incomplete RBBB. Gave 2 L NS. 1. Pt being monitored on telemetry. 2. Continuing patient's aspirin, starting metoprolol 12.5 BID tonight. 3. Per Dr. Dannette Barbara (cardiologist), syncope  is worrisome for ventricular arrhythmia and with FH of hypertrophic cardiomyopathy in mother. Planning for loop recorder/reveal monitor procedure tomorrow 4. Follow-up with Dr. Charlann Boxer after discharge. 2. New right bundle branch block: This appears to be an acute change from his last EKG on file. The patient is currently denying any chest pain and he has 3 negative troponins. Given that this is an acute change, cardiology has consulted and recs per above. 3. Hypertension: Continue the patient on his home Norvasc we'll also add low-dose lisinopril due to his diabetes. The lisinopril will be started prior to discharge due to his recent contrast load. 4. Diabetes: I will obtain an A1c at this time I will hold his metformin due to recent contrast administration. Plan to restart in 48 hours. Sliding scale insulin until then. 5. Hyperlipidemia: Obtain a direct LDL and continue the patient's home medications. 6. Lacerations: Currently there is no evidence of active bleeding. Surgery has been consulted and has signed off.  1. Followed CBC's every four hours upon admission for 12 hours.  Hgb at 10.9 today.  Will recheck tomorrow. 2. No blood products due to religious beliefs 3. Will avoid heparin if at all possible  4. Gave ferrous sulfate 325 mg po today 5. Follow-up ferritin to evaluate low hgb 7. FEN/GI: Carb modified diet, 2 L of normal saline boluses, half-normal saline at [facility] 8. Prophylaxis: SCDs 9. Disposition: Telemetry bed, inpatient status 10. Code Status: Full code Cody Curia, MD FMTS PGY-1

## 2012-04-05 NOTE — Progress Notes (Signed)
Patient ID: Cody Raymond, male   DOB: 01/28/1971, 41 y.o.   MRN: [id number]    Subjective: better  Objective: Vital signs in last 24 hours: Temp:  [98.1 F (36.7 C)-98.8 F (37.1 C)] 98.1 F (36.7 C) (07/18 0454) Pulse Rate:  [89-121] 89  (07/18 0454) Resp:  [12-20] 19  (07/18 0454) BP: (103-136)/(73-91) 117/81 mmHg (07/18 0454) SpO2:  [97 %-100 %] 98 % (07/18 0454) Weight:  [99.1 kg (218 lb 7.6 oz)-100.4 kg (221 lb 5.5 oz)] 99.1 kg (218 lb 7.6 oz) (07/18 0454) Last BM Date: 04/04/12  Intake/Output from previous day: 07/17 0701 - 07/18 0700 In: 320 [P.O.:320] Out: 1 [Urine:1] Intake/Output this shift:    General appearance: alert and cooperative GI: soft, NT Extremities: L posterior thigh/gluteal hematoma softer with no evidence of infection, mild bloody drainage Neuro: walking without difficulty  Lab Results: CBC   Basename 04/05/12 0248 04/04/12 0533  WBC 8.0 8.7  HGB 10.9* 12.3*  HCT 32.0* 35.1*  PLT 182 212   BMET  Basename 04/04/12 0242  NA 137  K 3.8  CL 101  CO2 20  GLUCOSE 161*  BUN 18  CREATININE 0.93  CALCIUM 9.0   PT/INR  Basename 04/04/12 0533  LABPROT 13.7  INR 1.03   ABG No results found for this basename: PHART:2,PCO2:2,PO2:2,HCO3:2 in the last 72 hours  Studies/Results: Dg Chest 1 View  04/04/2012  *RADIOLOGY REPORT*  Clinical Data: Syncope.  CHEST - 1 VIEW  Comparison: 12/13/2011.  Findings: Normal heart size and pulmonary vascularity.  No focal airspace consolidation in the lungs.  No blunting of costophrenic angles.  No pneumothorax.  Focal increased density projected over the right upper mediastinum inferior to the medial head of the clavicle is nonspecific etiology.  This could potentially represent a glass fragment.  Correlation with any injury in this location is recommended. The lateral view would be useful to further localize this structure.  Old left rib fractures.  Degenerative changes in the spine.  IMPRESSION: No evidence of  active pulmonary disease.  Nonspecific density projected over the right upper mediastinum as discussed.  Original Report Authenticated By: Marlon Pel, M.D.   Dg Forearm Left  04/04/2012  *RADIOLOGY REPORT*  Clinical Data: Lacerations after falling into a glass table.  LEFT FOREARM - 2 VIEW  Comparison: Left elbow 12/31/2009  Findings: Soft tissue irregularities along the dorsal aspect of the left forearm consistent with lacerations.  No radiopaque foreign bodies are demonstrated in the soft tissues.  Underlying bones appear intact without evidence of acute fracture or subluxation of the radius or ulna.  Degenerative changes are present in the elbow.  IMPRESSION: No acute bony abnormalities.  No radiopaque foreign bodies in the soft tissues.  Original Report Authenticated By: Marlon Pel, M.D.   Dg Hip 1 View Left  04/04/2012  *RADIOLOGY REPORT*  Clinical Data: Laceration after fall and a glass table.  LEFT HIP - 1 VIEW:  Comparison: None.  Findings: A single AP view of the left hip is obtained.  There is increased density in the soft tissues over the proximal femur are consistent with soft tissue hematoma.  No radiopaque foreign bodies identified.  No displaced fractures appreciated on single view.  IMPRESSION: No radiopaque foreign bodies identified.  Original Report Authenticated By: Marlon Pel, M.D.   Dg Femur Left  04/04/2012  *RADIOLOGY REPORT*  Clinical Data: Syncope with fall on glass table.  LEFT FEMUR - 2 VIEW  Comparison: None.  Findings: Lateral  view of the femur.  Soft tissue defect and mild subcutaneous gas collection over the posterior aspect of the proximal left thigh consistent with penetrating injury.  No radiopaque foreign bodies in the soft tissues.  No evidence of acute fracture on single view.  IMPRESSION: No acute bony injury identified.  Soft tissue lacerations in the posterior upper thigh soft tissues.  No radiopaque foreign bodies.  Original Report  Authenticated By: Marlon Pel, M.D.   Ct Head Wo Contrast  04/04/2012  *RADIOLOGY REPORT*  Clinical Data: Syncope.  Loss of consciousness.  Lacerations from glass table.  CT HEAD WITHOUT CONTRAST  Technique:  Contiguous axial images were obtained from the base of the skull through the vertex without contrast.  Comparison: 12/14/2011  Findings: The ventricles and sulci are symmetrical without significant effacement, displacement, or dilatation. No mass effect or midline shift. No abnormal extra-axial fluid collections. The grey-white matter junction is distinct. Basal cisterns are not effaced. No acute intracranial hemorrhage. No depressed skull fractures.  Visualized paranasal sinuses and mastoid air cells are not opacified.  Motion artifact limits the technical quality of the examination.  IMPRESSION: No acute intracranial abnormalities.  Original Report Authenticated By: Marlon Pel, M.D.   Ct Angio Low Extrem Left W/cm &/or Wo/cm  04/04/2012  *RADIOLOGY REPORT*  Clinical Data:  Expanding hematoma over the left buttocks after fall through glass table.  CT ANGIOGRAPHY OF THE PELVIS AND BOTH LOWER EXTREMITY  Technique:  Multidetector CT imaging of the pelvis and both lower extremity was performed using the standard protocol during bolus administration of intravenous contrast. Multiplanar CT image reconstructions including MIPs were obtained to evaluate the vascular anatomy.  Contrast: OMNIPAQUE IOHEXOL 350 MG/ML SOLN  Comparison:   None.  Findings: Skin defect and soft tissue lacerations in the post oral lateral aspect of the left upper thigh arising just below the buttocks region and superficial to the posterior muscle compartment.  There is subcutaneous gas present consistent with penetrating injury.  There is infiltration in the subcutaneous fat with a focal area of increased density measuring about 4.7 x 5.7 cm consistent with hematoma.  There is no evidence of active contrast  extravasation.  There is a tiny focus of increased density at the base of the hematoma, measuring about 6 mm diameter which could represent no radiopaque foreign body such as glass fragment.  No other foreign bodies are demonstrated.  No other focal hematomas. Visualized pelvic organs appear intact.  The external iliac, common femoral, deep femoral, and superficial femoral arteries appear patent.  The bones appear intact.  No evidence of acute fracture or subluxation.   Review of the MIP images confirms the above findings.  IMPRESSION: The skin defect and soft tissue gas in the left posterior upper thigh region with associated soft tissue hematomas extending down to the posterior muscle compartment.  No evidence of active contrast extravasation.  Tiny density in the deep aspect of the hematoma could represent small glass fragment.  Original Report Authenticated By: Marlon Pel, M.D.    Anti-infectives: Anti-infectives    None      Assessment/Plan: Fall through glass table Syncope - work-up via primary team ABL anemia Appreciate orthopedic eval - no surgery needed at this time.  Please have patient F/U with Dr. Charlann Boxer one week after D/C Trauma will sign off   LOS: 1 day    Violeta Gelinas, MD, MPH, FACS Pager: [phone]  04/05/2012

## 2012-04-06 ENCOUNTER — Ambulatory Visit (HOSPITAL_COMMUNITY): Admit: 2012-04-06 | Payer: Self-pay | Admitting: Cardiovascular Disease

## 2012-04-06 ENCOUNTER — Encounter (HOSPITAL_COMMUNITY)
Admission: EM | Disposition: A | Discharge status: Home / Self Care | Payer: Self-pay | Source: Home / Self Care | Attending: Family Medicine

## 2012-04-06 HISTORY — PX: LOOP RECORDER IMPLANT: SHX5477

## 2012-04-06 HISTORY — PX: LOOP RECORDER IMPLANT: SHX5954

## 2012-04-06 LAB — BASIC METABOLIC PANEL
BUN: 9 mg/dL (ref 6–23)
Calcium: 8.7 mg/dL (ref 8.4–10.5)
Creatinine, Ser: 0.86 mg/dL (ref 0.50–1.35)
GFR calc Af Amer: >90 mL/min (ref >90)
GFR calc non Af Amer: >90 mL/min (ref >90)

## 2012-04-06 LAB — CBC
MCH: 26.1 pg (ref 26.0–34.0)
MCHC: 34 g/dL (ref 30.0–36.0)
MCV: 76.8 fL — ABNORMAL LOW (ref 78.0–100.0)
Platelets: 221 10*3/uL (ref 150–400)
RDW: 16.5 % — ABNORMAL HIGH (ref 11.5–15.5)
WBC: 8.3 10*3/uL (ref 4.0–10.5)

## 2012-04-06 LAB — GLUCOSE, CAPILLARY
Glucose-Capillary: 159 mg/dL — ABNORMAL HIGH (ref 70–99)
Glucose-Capillary: 176 mg/dL — ABNORMAL HIGH (ref 70–99)

## 2012-04-06 SURGERY — LOOP RECORDER IMPLANT
Anesthesia: LOCAL

## 2012-04-06 MED ORDER — HYDROCODONE-ACETAMINOPHEN 5-325 MG PO TABS: 1.0000 | ORAL_TABLET | ORAL | Status: DC | PRN

## 2012-04-06 MED ORDER — METOPROLOL TARTRATE 12.5 MG HALF TABLET: 12.5000 mg | ORAL_TABLET | Freq: Two times a day (BID) | ORAL | Status: DC

## 2012-04-06 MED ORDER — LISINOPRIL 2.5 MG PO TABS: 2.5000 mg | ORAL_TABLET | Freq: Every day | ORAL | Status: DC

## 2012-04-06 MED ORDER — ONDANSETRON HCL 4 MG/2ML IJ SOLN: 4.0000 mg | Freq: Four times a day (QID) | INTRAMUSCULAR | Status: DC | PRN

## 2012-04-06 MED ORDER — ALBUTEROL SULFATE HFA 108 (90 BASE) MCG/ACT IN AERS
1.0000 | INHALATION_SPRAY | RESPIRATORY_TRACT | Status: DC | PRN
  Filled 2012-04-06: qty 6.7

## 2012-04-06 MED ORDER — ACETAMINOPHEN 325 MG PO TABS: 325.0000 mg | ORAL_TABLET | ORAL | Status: DC | PRN

## 2012-04-06 MED ORDER — LIDOCAINE HCL (PF) 1 % IJ SOLN
INTRAMUSCULAR | Status: AC
  Filled 2012-04-06: qty 60

## 2012-04-06 MED ORDER — HEPARIN (PORCINE) IN NACL 2-0.9 UNIT/ML-% IJ SOLN
INTRAMUSCULAR | Status: AC
  Filled 2012-04-06: qty 1000

## 2012-04-06 MED ORDER — FENTANYL CITRATE 0.05 MG/ML IJ SOLN
INTRAMUSCULAR | Status: AC
  Filled 2012-04-06: qty 2

## 2012-04-06 NOTE — CV Procedure (Signed)
LOOP RECORDER IMPLANT [[id number]]   Procedure report  Procedure performed:  1. Loop recorder implantation  2. Light sedation  Reason for procedure:  1. Syncope Procedure performed by:  Thurmon Fair, MD  Complications:  None  Estimated blood loss:  <5 mL  Medications administered during procedure:  Ancef 2 g intravenously, lidocaine 1% 30 mL locally, fentanyl 50 mcg intravenouslyDevice details:  medtronic Reveal XT  model number [id number], serial number [id number]  Procedure details:  After the risks and benefits of the procedure were discussed the patient provided informed consent. She was brought to the cardiac catheter lab in the fasting state. The patient was prepped and draped in usual sterile fashion. the best location for Loop recording had been established by preprocedure mapping to be in the 4th intercostal space 8 cm lateral to the left sternal border. Local anesthesia with 1% lidocaine was administered. A 3 cm horizontal incision was made. Usingelectrocautery and mostly blunt dissection a pocket was created with careful attention to hemostasis. The pocket was flushed with copious amounts of antibiotic solution.  The device was then carefully inserted in the pocket with care so that there would not be pressure on the incision. The pocket was then closed in layers using 2 layers of 2-0 Vicryl and one layer of 4-0 Vicryl, after which a sterile dressing was applied.   Thurmon Fair, MD, [facility] [phone] office [phone] pager 04/06/2012 12:58 PM

## 2012-04-06 NOTE — Progress Notes (Signed)
PGY-1 Daily Progress Note Family Medicine Teaching Service Simone Curia, MD Service Pager: [phone]   Subjective: Patient reports chest wheezing and tightness and requests albuterol treatment.  Objective:  VITALS Temp:  [97.1 F (36.2 C)-98.7 F (37.1 C)] 98 F (36.7 C) (07/19 0405) Pulse Rate:  [103-112] 103  (07/19 0405) Resp:  [16-20] 20  (07/19 0405) BP: (101-116)/(69-78) 102/69 mmHg (07/19 0405) SpO2:  [96 %-99 %] 99 % (07/19 0405) Weight:  [215 lb 13.3 oz (97.9 kg)] 215 lb 13.3 oz (97.9 kg) (07/19 0405)  In/Out No intake or output data in the 24 hours ending 04/06/12 0953  Physical Exam: General: NAD, lying in bed, appropriate  HEENT: MMM, EOMI Cardiovascular: Tachycardic but regular, no murmurs; chest with no laceration  Respiratory: bilateral expiratory wheezes throughout heard without stethoscope Abdomen: soft, nontender, nondistended Extremities: LUE has repaired laceration, No active bleeding on extremities. Able to move extremity but hesitant to due to pain.  Neuro: Alert and oriented x 3 Back: No laceration; old scar upper left side Skin: Multiple raised verrucous areas on chest  MEDS - reviewed  Labs and imaging:   CBC  Lab 04/06/12 0635 04/05/12 0248 04/04/12 0533  WBC 8.3 8.0 8.7  HGB 11.5* 10.9* 12.3*  HCT 33.8* 32.0* 35.1*  PLT 221 182 212   BMET/CMET  Lab 04/06/12 0635 04/04/12 0242  NA 136 137  K 3.8 3.8  CL 101 101  CO2 24 20  BUN 9 18  CREATININE 0.86 0.93  CALCIUM 8.7 9.0  PROT -- 7.2  BILITOT -- 0.4  ALKPHOS -- 120*  ALT -- 30  AST -- 28  GLUCOSE 126* 161*    7/18 Echocardiogram -  Study Conclusions  - Procedure narrative: Transthoracic echocardiography. Image quality was adequate. The study was technically difficult, as a result of restricted patient mobility. - Left ventricle: The cavity size was normal. There was moderate concentric hypertrophy. Systolic function was vigorous. The estimated ejection fraction was  in the range of 65% to 70%. Doppler parameters are consistent with abnormal left ventricular relaxation (grade 1 diastolic dysfunction). The E/e' ratio is <10, suggesting normal LV filling pressure. - Mitral valve: Trivial regurgitation. - Left atrium: The atrium was normal in size. - Systemic veins: The IVC is not well visualized. Transthoracic echocardiography. M-mode, complete 2D, spectral Doppler, and color Doppler. Height: Height: 172.7cm. Height: 68in. Weight: Weight: 99.1kg. Weight: 218lb. Body mass index: BMI: 33.2kg/m^2. Body surface area: BSA: 2.67m^2. Blood pressure: 117/81. Patient status: Inpatient. Location: Echo laboratory.    Assessment  Cody Raymond is a 41 y.o. year old male presenting with syncope and new RBBB     Plan:  1. Syncope: Given the patient's new right bundle branch block I feel that a cardiac etiology of the patient's syncope is most likely. Pt has negative troponin x 3. Due to his inability to take blood products and recent active bleeding I am hesitant to start any heparin unless we absolutely must.  EKG shows incomplete RBBB.  1. Continue telemetry 2. Continuing patient's aspirin, started metoprolol 12.5 BID. 3. Per Dr. Dannette Barbara (cardiologist), syncope is worrisome for ventricular arrhythmia and with FH of hypertrophic cardiomyopathy in mother. Planning for loop recorder/reveal monitor procedure today 4. Follow-up with Dr. Charlann Boxer after discharge. 2. New right bundle branch block: This appears to be an acute change from his last EKG on file. The patient is currently denying any chest pain and he has 3 negative troponins. Given that this is an acute change, cardiology has  consulted and recs per above. 3. Hypertension: D/c'ed norvasc and started low-dose metoprolol yesterday.  Consider low-dose lisinopril due to his diabetes. The lisinopril will be started prior to discharge due to his recent contrast load. 4. Diabetes: I will obtain an A1c at this time I  will hold his metformin due to recent contrast administration. Plan to restart in 48 hours. Sliding scale insulin until then. 5. Hyperlipidemia: Obtain a direct LDL and continue the patient's home medications. 6. Lacerations: Currently there is no evidence of active bleeding. Surgery has been consulted and has signed off.  1. Followed CBC's every four hours upon admission for 12 hours.  Hgb stable at 11.5 today.  Will recheck tomorrow. 2. No blood products due to religious beliefs 3. Will avoid heparin if at all possible  4. Gave ferrous sulfate 325 mg po yesterday 5. Follow-up ferritin to evaluate low hgb 7. Wheezing on exam - pt has albuterol neb ordered and respiratory therapy saw pt today; ordered albuterol inhaler 8. FEN/GI: NPO since mn for procedure; otherwise, Carb modified diet, 2 L of normal saline boluses, half-normal saline at [facility] 9. Prophylaxis: SCDs 10. Disposition: Telemetry bed, inpatient status; home once procedure done with f/u with cardiology and PCP 11. Code Status: Full code Simone Curia, MD FMTS PGY-1

## 2012-04-06 NOTE — Progress Notes (Signed)
Seen and examined.  [place] loop insertion.  No other complaints.  Not wheezing this afternoon.  Agree with Dr. Karie Schwalbe.  DC soon - when OK with cards.

## 2012-04-06 NOTE — Progress Notes (Signed)
Subjective: No complaints, continues with tachycardia, after 1 dose BB  Objective: Vital signs in last 24 hours: Temp:  [97.1 F (36.2 C)-98.7 F (37.1 C)] 98 F (36.7 C) (07/19 0405) Pulse Rate:  [103-112] 103  (07/19 0405) Resp:  [16-20] 20  (07/19 0405) BP: (101-116)/(69-78) 102/69 mmHg (07/19 0405) SpO2:  [96 %-99 %] 99 % (07/19 0405) Weight:  [97.9 kg (215 lb 13.3 oz)] 97.9 kg (215 lb 13.3 oz) (07/19 0405) Weight change: -2.5 kg (-5 lb 8.2 oz) Last BM Date: 04/04/12 Intake/Output from previous day: +319   Intake/Output this shift:    PE: General:alert and oriented X 3 no complaints except fatigue Heart:S1S2 RRR Lungs:clear without rales, rhonchi or wheezes Abd:+ BS soft non tender Ext:no edema    Lab Results:  Basename 04/06/12 0635 04/05/12 0248  WBC 8.3 8.0  HGB 11.5* 10.9*  HCT 33.8* 32.0*  PLT 221 182   BMET  Basename 04/06/12 0635 04/04/12 0242  NA 136 137  K 3.8 3.8  CL 101 101  CO2 24 20  GLUCOSE 126* 161*  BUN 9 18  CREATININE 0.86 0.93  CALCIUM 8.7 9.0    Basename 04/05/12 0248 04/04/12 2034  TROPONINI <0.30 <0.30    Lab Results  Component Value Date   CHOL 190 11/04/2011   HDL 36* 11/04/2011   LDLCALC Comment:   Not calculated due to Triglyceride >400. Suggest ordering Direct LDL (Unit Code: 40981).   Total Cholesterol/HDL Ratio:CHD Risk                        Coronary Heart Disease Risk Table                                        Men       Women          1/2 Average Risk              3.4        3.3              Average Risk              5.0        4.4           2X Average Risk              9.6        7.1           3X Average Risk             23.4       11.0 Use the calculated Patient Ratio above and the CHD Risk table  to determine the patient's CHD Risk. ATP III Classification (LDL):       < 100        mg/dL         Optimal      191 - 129     mg/dL         Near or Above Optimal      130 - 159     mg/dL         Borderline High      160 - 189      mg/dL         High       > 478  mg/dL         Very High   1/61/0960   LDLDIRECT 40 04/04/2012   TRIG 486* 11/04/2011   CHOLHDL 5.3 11/04/2011   Lab Results  Component Value Date   HGBA1C 5.9* 04/04/2012     Lab Results  Component Value Date   TSH 1.371 11/10/2010    Hepatic Function Panel  Basename 04/04/12 0242  PROT 7.2  ALBUMIN 3.9  AST 28  ALT 30  ALKPHOS 120*  BILITOT 0.4  BILIDIR --  IBILI --   No results found for this basename: CHOL in the last 72 hours No results found for this basename: PROTIME in the last 72 hours    EKG: Orders placed during the hospital encounter of 04/04/12  . ED EKG  . ED EKG  . EKG 12-LEAD  . EKG 12-LEAD  . EKG 12-LEAD  . EKG 12-LEAD  . EKG 12-LEAD  . EKG 12-LEAD    Studies/Results: Dg Chest 2 View  04/05/2012  *RADIOLOGY REPORT*  Clinical Data: Abnormal soft tissue density at the right apex seen on 04/04/2012  CHEST - 2 VIEW  Comparison: Chest x-ray dated 04/04/2012, 06/05/2011, and chest MRA dated 10/03/2004  Findings: The density seen on the prior chest x-ray represents an anatomic variant of an azygos fissure in the right lung apex medially.  This is of no clinical consequence.  Heart size and vascularity are normal.  Deformity of the left bony thorax consistent with prior surgery.  The lungs are clear.  No effusions.  IMPRESSION:  1.  No acute abnormalities. 2.  Anatomic variant of an azygos fissure of the right lung apex medially. 3.  Evidence of prior left chest surgery with rib deformities and adjacent soft tissue calcifications, stable.  Original Report Authenticated By: Gwynn Burly, M.D.   2D Echo:  Procedure narrative: Transthoracic echocardiography. Image quality was adequate. The study was technically difficult, as a result of restricted patient mobility. - Left ventricle: The cavity size was normal. There was moderate concentric hypertrophy. Systolic function was vigorous. The estimated ejection fraction was in  the range of 65% to 70%. Doppler parameters are consistent with abnormal left ventricular relaxation (grade 1 diastolic dysfunction). The E/e' ratio is <10, suggesting normal LV filling pressure. - Mitral valve: Trivial regurgitation. - Left atrium: The atrium was normal in size. - Systemic veins: The IVC is not well visualized  Medications: I have reviewed the patient's current medications.    Marland Kitchen atorvastatin  80 mg Oral q1800  . bacitracin   Topical BID  .  ceFAZolin (ANCEF) IV  2 g Intravenous On Call  . chlorhexidine  60 mL Topical Once  . chlorhexidine  60 mL Topical Once  . ferrous sulfate  325 mg Oral Q breakfast  . gentamicin irrigation  80 mg Irrigation On Call  . insulin aspart  0-9 Units Subcutaneous Q4H  . lisinopril  2.5 mg Oral Daily  . metoprolol tartrate  12.5 mg Oral BID  . pantoprazole  40 mg Oral Q1200  . sodium chloride  3 mL Intravenous Q12H  . sodium chloride  3 mL Intravenous Q12H  . DISCONTD: amLODipine  2.5 mg Oral Daily  . DISCONTD:  ceFAZolin (ANCEF) IV  2 g Intravenous On Call  . DISCONTD: chlorhexidine  60 mL Topical Once  . DISCONTD: gentamicin irrigation  80 mg Irrigation On Call  . DISCONTD: insulin aspart  0-15 Units Subcutaneous TID WC  . DISCONTD: insulin aspart  0-5 Units Subcutaneous QHS  Assessment/Plan: Patient Active Problem List  Diagnosis  . Hypertriglyceridemia  . OBESITY, NOS  . ERECTILE DYSFUNCTION  . Coronary artery spasm  . ASTHMA, PERSISTENT  . DIABETES MELLITUS, TYPE II  . GERD  . Abdominal  pain, other specified site  . Palpitations  . Chronic back pain  . Dizziness  . Anxiety  . Breast mass in male  . Syncope  . Right bundle branch block  . Laceration of left upper arm without mention of complication  . Acute blood loss anemia  . Laceration of left thigh with complication  . Neuropathy of left lower extremity  . CAD (coronary artery disease), non obstructive on cath 2011   PLAN:for loop recorder  today.  PLEASE note, EKG yesterday with Incomplete RBBB and on admit RBBB, old EKGs were also incomplete RBBB.  LOS: 2 days   INGOLD,LAURA R 04/06/2012, 8:24 AM   I have seen and examined the patient along with [facility] R, NP.  I have reviewed the chart, notes and new data.  I agree with NP's note.  Key new complaints: sore at injury site, occasional skipped beats Key examination changes: persistent mild sinus tachycardia on monitor/exam Key new findings / data: very mild anemia  PLAN: Syncope of uncertain etiology. Absence of prodrome suggests possible arrhythmic cause. Proceed with ILR.This procedure has been fully reviewed with the patient and written informed consent has been obtained.   Thurmon Fair, MD, [facility] [phone] 04/06/2012, 9:05 AM

## 2012-04-06 NOTE — Progress Notes (Signed)
Received from EP lab s/p loop recorder insertion.  Awake, A&O, no c/o.  Dressing CDI to left chest.  Call bell in reach.  Will monitor.

## 2012-04-06 NOTE — Progress Notes (Signed)
Seen and examined on 7/18.  Agree with Dr. Karie Schwalbe.

## 2012-04-07 NOTE — Discharge Summary (Signed)
Discharge Summary 04/07/2012 5:59 PM  Cody Raymond DOB: 1970-09-20 MRN: [id number]  Date of Admission: 04/04/2012 Date of Discharge: 04/06/2012  PCP: Estill Cotta, MD Consultants: Cardiology, Trauma, Orthopedics  Reason for Admission: Syncope with new RBBB and fall   Discharge Diagnosis Primary 1. Syncope Secondary 1. New Right Bundle Branch Block 2. Hypertension 3. Diabetes 4. Hyperlipidemia 5. Lacerations 6. Asthma   Hospital Course: Cody Raymond is a 41 y.o. year old male presenting with syncope and new RBBB    1. Syncope: Given the patient's new right bundle branch block on EKG, cardiac etiology of syncope most likely, but with negative troponin x 3.  Did not start heparin due to inability to take blood products as a [employer].  Admitted patient on telemetry, continued home aspirin, and started metoprolol 12.5 BID.  Per pt's cardioloist Dr. Dannette Barbara, syncope worrisome for ventricular arrhythmia and with FH of hypertrophic cardiomyopathy in mother, so planned for loop recorder/reveal monitor procedure.  Pt tolerated this procedure well and will follow-up with cardiology as an outpatient.  2. New right bundle branch block: This appears to be an acute change from his last EKG on file. Pt denied new chest pain and had 3 negative troponins. Given that this is an acute change, cardiology has consulted and recommended implantation of loop recorder. See above. 3. Hypertension: Discontinued norvasc and started low-dose metoprolol during hospital course. Started lisinopril just prior to discharge to avoid interaction with admission contrast studies. 4. Diabetes: Held metformin due to recent contrast administration and maintained pt on sliding scale insulin. A1c was 5.9.  Restarted patient on metformin at discharge. 5. Hyperlipidemia: Continued the patient's home medications. Direct LDL was 40. 6. Lacerations: Pt had no evidence of active bleeding during hospital  course.  Followed CBCs every four hours upon admission for 12 hours and then daily.  Hgb remained stable through hospital course.  Pt was to receive no blood products due to religious beliefs and heparin was avoided.  Gave ferrous sulfate 325 mg po to help blood production.  Ferritin was normal, therefore did not discharge on iron.  Patient to follow-up with Dr. Charlann Boxer as outpatient. 7. Asthma - Pt maintained on prn albuterol nebulizer.    Procedures:   EKG: Tachycardia with right bundle branch block  CT Angio Pelvis - IMPRESSION:  The skin defect and soft tissue gas in the left posterior upper  thigh region with associated soft tissue hematomas extending down  to the posterior muscle compartment. No evidence of active  contrast extravasation. Tiny density in the deep aspect of the  hematoma could represent small glass fragment.  CT LE: IMPRESSION:  The skin defect and soft tissue gas in the left posterior upper  thigh region with associated soft tissue hematomas extending down  to the posterior muscle compartment. No evidence of active  contrast extravasation. Tiny density in the deep aspect of the  hematoma could represent small glass fragment.  2 view CXR: IMPRESSION:  1. No acute abnormalities.  2. Anatomic variant of an azygos fissure of the right lung apex  medially.  3. Evidence of prior left chest surgery with rib deformities and  adjacent soft tissue calcifications, stable.  Loop recorder implant  Discharge Medications Medication List  As of 04/07/2012  5:59 PM   START taking these medications         HYDROcodone-acetaminophen 5-325 MG per tablet   Commonly known as: NORCO/VICODIN   Take 1-2 tablets by mouth every 4 (four) hours as  needed.      lisinopril 2.5 MG tablet   Commonly known as: PRINIVIL,ZESTRIL   Take 1 tablet (2.5 mg total) by mouth daily.      metoprolol tartrate 12.5 mg Tabs   Commonly known as: LOPRESSOR   Take 0.5 tablets (12.5 mg total) by mouth 2  (two) times daily.         CONTINUE taking these medications         * albuterol 108 (90 BASE) MCG/ACT inhaler   Commonly known as: PROVENTIL HFA;VENTOLIN HFA      * albuterol (2.5 MG/3ML) 0.083% nebulizer solution   Commonly known as: PROVENTIL   Take 3 mLs (2.5 mg total) by nebulization every 6 (six) hours as needed for wheezing.      aspirin 325 MG tablet      calcium carbonate 500 MG chewable tablet   Commonly known as: TUMS - dosed in mg elemental calcium      cyclobenzaprine 5 MG tablet   Commonly known as: FLEXERIL      dicyclomine 20 MG tablet   Commonly known as: BENTYL      fish oil-omega-3 fatty acids 1000 MG capsule      glucose blood test strip   Use to check blood sugar once a day      metFORMIN 500 MG tablet   Commonly known as: GLUCOPHAGE   Take 1 tablet (500 mg total) by mouth daily with breakfast.      nitroGLYCERIN 0.4 MG SL tablet   Commonly known as: NITROSTAT   Place 1 tablet (0.4 mg total) under the tongue every 5 (five) minutes as needed for chest pain.      omeprazole 40 MG capsule   Commonly known as: PRILOSEC   Take 1 capsule (40 mg total) by mouth daily with supper.      rosuvastatin 40 MG tablet   Commonly known as: CRESTOR      traMADol-acetaminophen 37.5-325 MG per tablet   Commonly known as: ULTRACET     * Notice: This list has 2 medication(s) that are the same as other medications prescribed for you. Read the directions carefully, and ask your doctor or other care provider to review them with you.       STOP taking these medications         amLODipine 2.5 MG tablet      doxycycline 100 MG capsule      levofloxacin 500 MG tablet      traMADol 50 MG tablet          Where to get your medications    These are the prescriptions that you need to pick up. We sent them to a specific pharmacy, so you will need to go there to get them.   [facility] - [place], [place] - [address] [place] [facility] [address]    [address] [place] [address]    Phone: [phone]        lisinopril 2.5 MG tablet   metoprolol tartrate 12.5 mg Tabs         You may get these medications from any pharmacy.         HYDROcodone-acetaminophen 5-325 MG per tablet            Pertinent Hospital Labs CBC    Component Value Date/Time   WBC 8.3 04/06/2012 0635   RBC 4.40 04/06/2012 0635   HGB 11.5* 04/06/2012 0635   HCT 33.8* 04/06/2012 9147  PLT 221 04/06/2012 0635   MCV 76.8* 04/06/2012 0635   MCH 26.1 04/06/2012 0635   MCHC 34.0 04/06/2012 0635   RDW 16.5* 04/06/2012 0635   LYMPHSABS 0.9 12/14/2011 0003   MONOABS 0.6 12/14/2011 0003   EOSABS 0.1 12/14/2011 0003   BASOSABS 0.0 12/14/2011 0003    BMET    Component Value Date/Time   NA 136 04/06/2012 0635   K 3.8 04/06/2012 0635   CL 101 04/06/2012 0635   CO2 24 04/06/2012 0635   GLUCOSE 126* 04/06/2012 0635   BUN 9 04/06/2012 0635   CREATININE 0.86 04/06/2012 0635   CREATININE 0.82 02/01/2012 1035   CALCIUM 8.7 04/06/2012 0635   GFRNONAA >90 04/06/2012 0635   GFRAA >90 04/06/2012 1610      Discharge instructions: see AVS  Condition at discharge: stable  Disposition: to home  Pending Tests: none  Follow up: Follow-up Information    Follow up with Letitia Libra, Ala Dach, MD in 1 week.   Contact information:   [address] [place] [address] [phone]       Follow up with Shelda Pal, MD in 1 week.   Contact information:   [facility] [address] [place] [address] [phone]       Follow up with [facility] in 1 week. [phone]          Follow up Issues:  - Cardiology - check on loop recorder - f/u incisions - f/u for any further syncopal episodes

## 2012-04-08 NOTE — Discharge Summary (Signed)
Seen and examined on 7/20.  Agree with DC as outlined by Dr. Karie Schwalbe.

## 2012-04-10 ENCOUNTER — Ambulatory Visit (INDEPENDENT_AMBULATORY_CARE_PROVIDER_SITE_OTHER): Payer: Managed Care, Other (non HMO) | Admitting: Internal Medicine

## 2012-04-10 ENCOUNTER — Encounter: Payer: Self-pay | Admitting: Internal Medicine

## 2012-04-10 ENCOUNTER — Emergency Department (HOSPITAL_COMMUNITY)
Admission: EM | Admit: 2012-04-10 | Discharge: 2012-04-10 | Disposition: A | Discharge status: Home / Self Care | Payer: Managed Care, Other (non HMO) | Attending: Emergency Medicine | Admitting: Emergency Medicine

## 2012-04-10 ENCOUNTER — Emergency Department (HOSPITAL_COMMUNITY): Payer: Managed Care, Other (non HMO)

## 2012-04-10 ENCOUNTER — Encounter (HOSPITAL_COMMUNITY): Payer: Self-pay | Admitting: *Deleted

## 2012-04-10 VITALS — Ht 68.0 in

## 2012-04-10 DIAGNOSIS — I10 Essential (primary) hypertension: Secondary | ICD-10-CM | POA: Insufficient documentation

## 2012-04-10 DIAGNOSIS — I2510 Atherosclerotic heart disease of native coronary artery without angina pectoris: Secondary | ICD-10-CM | POA: Insufficient documentation

## 2012-04-10 DIAGNOSIS — E119 Type 2 diabetes mellitus without complications: Secondary | ICD-10-CM | POA: Insufficient documentation

## 2012-04-10 DIAGNOSIS — W268XXA Contact with other sharp object(s), not elsewhere classified, initial encounter: Secondary | ICD-10-CM | POA: Insufficient documentation

## 2012-04-10 DIAGNOSIS — Z09 Encounter for follow-up examination after completed treatment for conditions other than malignant neoplasm: Secondary | ICD-10-CM | POA: Insufficient documentation

## 2012-04-10 DIAGNOSIS — S81802A Unspecified open wound, left lower leg, initial encounter: Secondary | ICD-10-CM

## 2012-04-10 DIAGNOSIS — S81009A Unspecified open wound, unspecified knee, initial encounter: Secondary | ICD-10-CM

## 2012-04-10 DIAGNOSIS — S71009A Unspecified open wound, unspecified hip, initial encounter: Secondary | ICD-10-CM | POA: Insufficient documentation

## 2012-04-10 DIAGNOSIS — S71109A Unspecified open wound, unspecified thigh, initial encounter: Secondary | ICD-10-CM | POA: Insufficient documentation

## 2012-04-10 DIAGNOSIS — R000 Tachycardia, unspecified: Secondary | ICD-10-CM

## 2012-04-10 LAB — CBC WITH DIFFERENTIAL/PLATELET
Basophils Absolute: 0.1 10*3/uL (ref 0.0–0.1)
Basophils Relative: 1 % (ref 0–1)
MCHC: 34.9 g/dL (ref 30.0–36.0)
Neutro Abs: 7.1 10*3/uL (ref 1.7–7.7)
Neutrophils Relative %: 76 % (ref 43–77)
Platelets: 343 10*3/uL (ref 150–400)
RDW: 16.6 % — ABNORMAL HIGH (ref 11.5–15.5)

## 2012-04-10 LAB — GLUCOSE, POCT (MANUAL RESULT ENTRY): POC Glucose: 110 mg/dl — AB (ref 70–99)

## 2012-04-10 LAB — COMPREHENSIVE METABOLIC PANEL
AST: 27 U/L (ref 0–37)
Albumin: 4.2 g/dL (ref 3.5–5.2)
Alkaline Phosphatase: 121 U/L — ABNORMAL HIGH (ref 39–117)
Chloride: 101 mEq/L (ref 96–112)
Creatinine, Ser: 0.82 mg/dL (ref 0.50–1.35)
Potassium: 4.1 mEq/L (ref 3.5–5.1)
Sodium: 138 mEq/L (ref 135–145)
Total Bilirubin: 1.1 mg/dL (ref 0.3–1.2)

## 2012-04-10 MED ORDER — SODIUM CHLORIDE 0.9 % IV BOLUS (SEPSIS)
1000.0000 mL | Freq: Once | INTRAVENOUS | Status: AC
  Administered 2012-04-10: 1000 mL via INTRAVENOUS

## 2012-04-10 NOTE — Assessment & Plan Note (Signed)
With increasing thigh size, constant oozing as well as h/o deep wound cannot exclude active deep bleeding. Recommend further evaluation including potential re-imaging. Pt to proceed to [facility] ED and they are apprised of his arrival.

## 2012-04-10 NOTE — Progress Notes (Signed)
  Subjective:    Patient ID: Cody Raymond, male    DOB: 09-05-1971, 41 y.o.   MRN: [id number]  HPI Pt presents to clinic as a work in for evaluation of increasing leg size and bleeding. Recently discharged from [facility] after syncopal episode resulted in left upper leg trauma. Underwent CT angiogram of leg demonstrating hematoma, possible small retained FB and no obvious extravasation of contrast from vessels. Was admitted for monitoring and implantable loop recorder was placed. Since discharge 04/06/12 pt notes increasing left upper thigh size and constant oozing of fresh blood from wound-worse with ambulation. Notes recent sweating and believes may be related to hyperglycemia- fsbs 200's since discharge. Has resumed metformin.   Past Medical History  Diagnosis Date  . Diabetes mellitus   . Asthma   . Enlarged prostate   . Hyperlipidemia   . Heart disease   . Hypertension   . Blood transfusion   . GERD (gastroesophageal reflux disease)   . Refusal of blood transfusions as patient is Jehovah's Witness   . Neuromuscular disorder     DJD  . Coronary artery disease   . CAD (coronary artery disease), non obstructive on cath 2011 04/05/2012   Past Surgical History  Procedure Date  . Appendectomy   . Patent ductus arterious repair     at age 19  . Colonoscopy w/ polypectomy 03/17/11    diminutive polyp  . Cardiac catheterization 2007    [facility] Cards), with RCA 30% narrowing likely due to catheter induced spasm in 09/02/10.  . Back surgery     reports that he has never smoked. He has never used smokeless tobacco. He reports that he drinks alcohol. He reports that he does not use illicit drugs. family history includes Aneurysm in his father; Breast cancer in his maternal grandmother; Colon cancer in his paternal grandfather and paternal uncle; Colon polyps in his mother; Coronary artery disease in his father and mother; Heart attack (age of onset:50) in  his father; Heart disease in his mother; Liver disease in an unspecified family member; Prostate cancer in his paternal grandfather; and Stomach cancer in his brother. Allergies  Allergen Reactions  . Benadryl (Diphenhydramine Hcl)     "drives me nuts"  . Red Blood Cells     PT. REFUSES ANY BLOOD PRODUCTS - JEHOVAH'S WITNESS.     Review of Systems see hpi     Objective:   Physical Exam  Nursing note and vitals reviewed. Constitutional: He appears well-developed and well-nourished. No distress.  HENT:  Head: Normocephalic and atraumatic.  Musculoskeletal:       Left upper leg- lateral aspect bandaged with oozing fresh blood noted to outer layer of bandage. ST swelling of thigh. Able to weight bear and ambulate without assistance.  Skin: Skin is warm and dry. He is not diaphoretic.          Assessment & Plan:

## 2012-04-10 NOTE — ED Notes (Signed)
Pt appears pale and noted HR 117 at triage.  Pt had loop monitor placed to left chest.  Pt states on Wednesday had left posterior thigh was sutured from becoming syncopal and fell through glass table.  He was told by the surgeon that he had a big hematoma under it and it started having problems to come back.  Pt states it feels swollen and feels like something is leaking behind it be he has no leaking.  Pt appears weak and pulse is present in LLE

## 2012-04-10 NOTE — ED Provider Notes (Addendum)
History     CSN: [id number]  Arrival date & time 04/10/12  1647   First MD Initiated Contact with Patient 04/10/12 1738      No chief complaint on file.   (Consider location/radiation/quality/duration/timing/severity/associated sxs/prior treatment) HPI    The patient presents with concerns of ongoing left thigh and buttock pain and oozing.  One week ago the patient had a syncopal episode.  As a result he fell onto a glass table, and he suffered a laceration on his left buttock, as well as his left arm.  He was admitted, had a loop monitor placed, was discharged.  He notes that since that time he has had no additional syncopal events, no chest pain, no dyspnea.  He does note ongoing generalized discomfort, and mild palpitations. The patient's primary concern is ongoing oozing and increasing size of his left thigh lesion.  He notes that there continues to be active bleeding, increasing pain.  The pain is dull, with radiation towards his knee. The patient saw his primary care physician was referred here for evaluation today.  Past Medical History  Diagnosis Date  . Diabetes mellitus   . Asthma   . Enlarged prostate   . Hyperlipidemia   . Heart disease   . Hypertension   . Blood transfusion   . GERD (gastroesophageal reflux disease)   . Refusal of blood transfusions as patient is Jehovah's Witness   . Neuromuscular disorder     DJD  . Coronary artery disease   . CAD (coronary artery disease), non obstructive on cath 2011 04/05/2012    Past Surgical History  Procedure Date  . Appendectomy   . Patent ductus arterious repair     at age 50  . Colonoscopy w/ polypectomy 03/17/11    diminutive polyp  . Cardiac catheterization 2007    Clean Cardiac Cath [facility] Cards), with RCA 30% narrowing likely due to catheter induced spasm in 09/02/10.  . Back surgery     Family History  Problem Relation Age of Onset  . Colon cancer Paternal Grandfather   . Prostate cancer Paternal  Grandfather   . Aneurysm Father   . Heart attack Father 30  . Coronary artery disease Father   . Colon cancer Paternal Uncle     x 2  . Colon polyps Mother   . Heart disease Mother   . Coronary artery disease Mother   . Breast cancer Maternal Grandmother   . Stomach cancer Brother   . Liver disease      unsure who it was    History  Substance Use Topics  . Smoking status: Never Smoker   . Smokeless tobacco: Never Used  . Alcohol Use: Yes     rare      Review of Systems  Constitutional:       Per HPI, otherwise negative  HENT:       Per HPI, otherwise negative  Eyes: Negative.   Respiratory:       Per HPI, otherwise negative  Cardiovascular:       Per HPI, otherwise negative  Gastrointestinal: Negative for vomiting.  Genitourinary: Negative.   Musculoskeletal:       Per HPI, otherwise negative  Skin: Negative.   Neurological: Negative for syncope.    Allergies  Benadryl and Red blood cells  Home Medications   Current Outpatient Rx  Name Route Sig Dispense Refill  . ALBUTEROL SULFATE HFA 108 (90 BASE) MCG/ACT IN AERS Inhalation Inhale 2 puffs into the lungs  every 6 (six) hours as needed. Use with spacer    . ALBUTEROL SULFATE (2.5 MG/3ML) 0.083% IN NEBU Nebulization Take 2.5 mg by nebulization every 6 (six) hours as needed. For wheezing    . ASPIRIN 325 MG PO TABS Oral Take 325 mg by mouth daily.    Marland Kitchen CALCIUM CARBONATE ANTACID 500 MG PO CHEW Oral Chew 2 tablets by mouth daily as needed.    . CYCLOBENZAPRINE HCL 5 MG PO TABS Oral Take 5-10 mg by mouth at bedtime as needed.    Marland Kitchen DICYCLOMINE HCL 20 MG PO TABS  1/2 - 1 tablet every 6 hours as needed for abdominal pain    . OMEGA-3 FATTY ACIDS 1000 MG PO CAPS Oral Take 1 g by mouth 2 (two) times daily.    Marland Kitchen LISINOPRIL 2.5 MG PO TABS Oral Take 2.5 mg by mouth daily.    Marland Kitchen METFORMIN HCL 500 MG PO TABS Oral Take 500 mg by mouth daily with breakfast.    . METOPROLOL TARTRATE 25 MG PO TABS Oral Take 12.5 mg by mouth 2  (two) times daily.    Marland Kitchen NITROGLYCERIN 0.4 MG SL SUBL Sublingual Place 0.4 mg under the tongue every 5 (five) minutes as needed.    Marland Kitchen OMEPRAZOLE 40 MG PO CPDR Oral Take 40 mg by mouth daily with supper.    Marland Kitchen ROSUVASTATIN CALCIUM 40 MG PO TABS Oral Take 40 mg by mouth daily.    . TRAMADOL-ACETAMINOPHEN 37.5-325 MG PO TABS Oral Take 1 tablet by mouth every 6 (six) hours as needed.      BP 106/75  Pulse 117  Temp 98.3 F (36.8 C) (Oral)  Resp 16  SpO2 98%  Physical Exam  Nursing note and vitals reviewed. Constitutional: He appears well-developed and well-nourished. No distress.  HENT:  Head: Normocephalic and atraumatic.  Eyes: Conjunctivae are normal.  Cardiovascular: Intact distal pulses and normal pulses.  Tachycardia present.   Pulmonary/Chest: Effort normal and breath sounds normal. No stridor.    Abdominal: He exhibits no distension.  Musculoskeletal:       Back:       Arms: Neurological: He is alert. No cranial nerve deficit. He exhibits normal muscle tone. Coordination normal.  Skin: Skin is warm and dry. He is not diaphoretic.  Psychiatric: He has a normal mood and affect.    ED Course  Procedures (including critical care time)  Labs Reviewed  CBC WITH DIFFERENTIAL - Abnormal; Notable for the following:    Hemoglobin 12.1 (*)     HCT 34.7 (*)     MCV 76.6 (*)     RDW 16.6 (*)     All other components within normal limits  COMPREHENSIVE METABOLIC PANEL - Abnormal; Notable for the following:    Glucose, Bld 112 (*)     Alkaline Phosphatase 121 (*)     All other components within normal limits   No results found.   No diagnosis found.  Cardiac 115st, abnormal  Pulse ox 100% ra, normal   Date: 04/25/2012  Rate: 114  Rhythm: sinus tachycardia  QRS Axis: normal  Intervals: normal  ST/T Wave abnormalities: nonspecific T wave changes  Conduction Disutrbances:right bundle branch block  Narrative Interpretation:   Old EKG Reviewed: none  available ABNORMAL   MDM  The patient's evaluation today demonstrates a stable, increasing hemoglobin, decreasing size of his hematoma with no evidence of increased fluid collection.  On repeat evaluation the patient was informed of all results, notes that he  feels better.  He was discharged in stable condition to follow up with his primary care physician and to have sutures removed in two days      Gerhard Munch, MD 04/10/12 2039  Gerhard Munch, MD 04/25/12 [phone]

## 2012-04-10 NOTE — ED Notes (Signed)
Pt st's he had sutures placed in left upper leg on Wed.  St's upper leg feels tight.  Was told to return to ED if he had swelling.  Strong pedal pulses present.  Family at bedside.

## 2012-04-12 ENCOUNTER — Ambulatory Visit: Payer: Self-pay | Admitting: Internal Medicine

## 2012-04-16 ENCOUNTER — Encounter (HOSPITAL_COMMUNITY): Payer: Self-pay | Admitting: *Deleted

## 2012-04-16 ENCOUNTER — Emergency Department (HOSPITAL_COMMUNITY)
Admission: EM | Admit: 2012-04-16 | Discharge: 2012-04-16 | Disposition: A | Discharge status: Home / Self Care | Payer: Managed Care, Other (non HMO) | Attending: Emergency Medicine | Admitting: Emergency Medicine

## 2012-04-16 DIAGNOSIS — E119 Type 2 diabetes mellitus without complications: Secondary | ICD-10-CM | POA: Insufficient documentation

## 2012-04-16 DIAGNOSIS — Z4802 Encounter for removal of sutures: Secondary | ICD-10-CM | POA: Insufficient documentation

## 2012-04-16 DIAGNOSIS — I10 Essential (primary) hypertension: Secondary | ICD-10-CM | POA: Insufficient documentation

## 2012-04-16 DIAGNOSIS — E785 Hyperlipidemia, unspecified: Secondary | ICD-10-CM | POA: Insufficient documentation

## 2012-04-16 DIAGNOSIS — I2510 Atherosclerotic heart disease of native coronary artery without angina pectoris: Secondary | ICD-10-CM | POA: Insufficient documentation

## 2012-04-16 DIAGNOSIS — IMO0001 Reserved for inherently not codable concepts without codable children: Secondary | ICD-10-CM

## 2012-04-16 DIAGNOSIS — K219 Gastro-esophageal reflux disease without esophagitis: Secondary | ICD-10-CM | POA: Insufficient documentation

## 2012-04-16 NOTE — ED Notes (Signed)
Had sutures placed last week and states needs sutures out states left arm good no issues but sutures in left leg are still draining alot

## 2012-04-16 NOTE — ED Provider Notes (Signed)
History   This chart was scribed for Benny Lennert, MD by Sofie Rower. The patient was seen in room TR05C/TR05C and the patient's care was started at 11:19 AM     CSN: [id number]  Arrival date & time 04/16/12  4098   None     Chief Complaint  Patient presents with  . Follow-up    (Consider location/radiation/quality/duration/timing/severity/associated sxs/prior treatment) Patient is a 40 y.o. male presenting with suture removal. The history is provided by the patient. No language interpreter was used.  Suture / Staple Removal  The sutures were placed 7 to 10 days ago. There has been no treatment since the wound repair. His temperature was unmeasured prior to arrival. There has been bloody discharge from the wound. The redness has not changed. The swelling has not changed. The pain has not changed. He has no difficulty moving the affected extremity or digit.    Past Medical History  Diagnosis Date  . Diabetes mellitus   . Asthma   . Enlarged prostate   . Hyperlipidemia   . Heart disease   . Hypertension   . Blood transfusion   . GERD (gastroesophageal reflux disease)   . Refusal of blood transfusions as patient is Jehovah's Witness   . Neuromuscular disorder     DJD  . Coronary artery disease   . CAD (coronary artery disease), non obstructive on cath 2011 04/05/2012    Past Surgical History  Procedure Date  . Appendectomy   . Patent ductus arterious repair     at age 13  . Colonoscopy w/ polypectomy 03/17/11    diminutive polyp  . Cardiac catheterization 2007    Clean Cardiac Cath [facility] Cards), with RCA 30% narrowing likely due to catheter induced spasm in 09/02/10.  . Back surgery     Family History  Problem Relation Age of Onset  . Colon cancer Paternal Grandfather   . Prostate cancer Paternal Grandfather   . Aneurysm Father   . Heart attack Father 45  . Coronary artery disease Father   . Colon cancer Paternal Uncle     x 2  . Colon polyps Mother   .  Heart disease Mother   . Coronary artery disease Mother   . Breast cancer Maternal Grandmother   . Stomach cancer Brother   . Liver disease      unsure who it was    History  Substance Use Topics  . Smoking status: Never Smoker   . Smokeless tobacco: Never Used  . Alcohol Use: Yes     rare      Review of Systems  All other systems reviewed and are negative.        Allergies  Benadryl and Red blood cells  Home Medications   Current Outpatient Rx  Name Route Sig Dispense Refill  . ALBUTEROL SULFATE HFA 108 (90 BASE) MCG/ACT IN AERS Inhalation Inhale 2 puffs into the lungs every 6 (six) hours as needed. Use with spacer, for asthma    . ALBUTEROL SULFATE (2.5 MG/3ML) 0.083% IN NEBU Nebulization Take 2.5 mg by nebulization every 6 (six) hours as needed. For wheezing    . ASPIRIN 325 MG PO TABS Oral Take 325 mg by mouth daily.    . CYCLOBENZAPRINE HCL 5 MG PO TABS Oral Take 5-10 mg by mouth at bedtime as needed. For spasms    . OMEGA-3 FATTY ACIDS 1000 MG PO CAPS Oral Take 1 g by mouth 2 (two) times daily.    Marland Kitchen  LANSOPRAZOLE 15 MG PO CPDR Oral Take 15 mg by mouth daily.    Marland Kitchen LISINOPRIL 2.5 MG PO TABS Oral Take 2.5 mg by mouth daily.    Marland Kitchen METFORMIN HCL 500 MG PO TABS Oral Take 500 mg by mouth daily with breakfast.    . METOPROLOL TARTRATE 25 MG PO TABS Oral Take 12.5 mg by mouth 2 (two) times daily.    Marland Kitchen OMEPRAZOLE 40 MG PO CPDR Oral Take 40 mg by mouth daily with supper.    Marland Kitchen ROSUVASTATIN CALCIUM 40 MG PO TABS Oral Take 40 mg by mouth daily.    . TRAMADOL-ACETAMINOPHEN 37.5-325 MG PO TABS Oral Take 1 tablet by mouth every 6 (six) hours as needed. For pain    . NITROGLYCERIN 0.4 MG SL SUBL Sublingual Place 0.4 mg under the tongue every 5 (five) minutes as needed.      BP 116/72  Pulse 102  Temp 98.1 F (36.7 C) (Oral)  Resp 16  SpO2 98%  Physical Exam  Nursing note and vitals reviewed. Constitutional: He is oriented to person, place, and time. He appears  well-developed.  HENT:  Head: Normocephalic.  Eyes: Conjunctivae are normal.  Neck: No tracheal deviation present.  Cardiovascular:  No murmur heard. Musculoskeletal: Normal range of motion.       Healed laceration to left forearm and left buttocks. Laceration with hematoma, laceration is not fully healed yet.   Neurological: He is oriented to person, place, and time.  Skin: Skin is warm.  Psychiatric: He has a normal mood and affect.    ED Course  Procedures (including critical care time)  DIAGNOSTIC STUDIES: Oxygen Saturation is 98% on room air, normal by my interpretation.    COORDINATION OF CARE:   11:21AM- EDP at bedside discusses treatment plan concerning suture removal and application of staples.  11:26AM- EDP at bedside to conduct suture removal at left forearm and staple procedure and left buttocks.    Labs Reviewed - No data to display No results found.   No diagnosis found.  Pt had a healed lac to left forearm and a lac to left thigh which was not completely healed.  The left thigh had a small hematoma and was oozing.  8 staples were added to close the lac  MDM      Benny Lennert, MD 04/16/12 [phone]

## 2012-04-16 NOTE — ED Notes (Signed)
Pt reports he is here to get stitches removed from left arm and left thigh.

## 2012-04-25 ENCOUNTER — Encounter (HOSPITAL_COMMUNITY): Payer: Self-pay | Admitting: Emergency Medicine

## 2012-04-25 ENCOUNTER — Emergency Department (HOSPITAL_COMMUNITY)
Admission: EM | Admit: 2012-04-25 | Discharge: 2012-04-25 | Disposition: A | Discharge status: Home / Self Care | Payer: Managed Care, Other (non HMO) | Attending: Emergency Medicine | Admitting: Emergency Medicine

## 2012-04-25 DIAGNOSIS — I2510 Atherosclerotic heart disease of native coronary artery without angina pectoris: Secondary | ICD-10-CM | POA: Insufficient documentation

## 2012-04-25 DIAGNOSIS — Z4802 Encounter for removal of sutures: Secondary | ICD-10-CM

## 2012-04-25 DIAGNOSIS — E119 Type 2 diabetes mellitus without complications: Secondary | ICD-10-CM | POA: Insufficient documentation

## 2012-04-25 DIAGNOSIS — E785 Hyperlipidemia, unspecified: Secondary | ICD-10-CM | POA: Insufficient documentation

## 2012-04-25 DIAGNOSIS — K219 Gastro-esophageal reflux disease without esophagitis: Secondary | ICD-10-CM | POA: Insufficient documentation

## 2012-04-25 DIAGNOSIS — J45909 Unspecified asthma, uncomplicated: Secondary | ICD-10-CM | POA: Insufficient documentation

## 2012-04-25 NOTE — ED Notes (Signed)
Pt c/o suture removal/staple removal from thigh

## 2012-04-25 NOTE — ED Provider Notes (Signed)
History     CSN: [id number]  Arrival date & time 04/25/12  0818   First MD Initiated Contact with Patient 04/25/12 [phone]      Chief Complaint  Patient presents with  . Suture / Staple Removal    (Consider location/radiation/quality/duration/timing/severity/associated sxs/prior treatment) HPI Comments: Patient presents today for both staple and suture removal.  The staples were placed on 04/16/12.  Sutures placed 04/10/12.  Sutures and staples located left lateral thigh.  Patient reports that his pain is improving.    Patient is a 41 y.o. male presenting with suture removal. The history is provided by the patient.  Suture / Staple Removal  Treatments since wound repair include antibiotic ointment use. Fever duration: No fever. There has been no drainage from the wound. The redness has improved. The swelling has improved. The pain has improved. He has no difficulty moving the affected extremity or digit.    Past Medical History  Diagnosis Date  . Diabetes mellitus   . Asthma   . Enlarged prostate   . Hyperlipidemia   . Heart disease   . Hypertension   . Blood transfusion   . GERD (gastroesophageal reflux disease)   . Refusal of blood transfusions as patient is Jehovah's Witness   . Neuromuscular disorder     DJD  . Coronary artery disease   . CAD (coronary artery disease), non obstructive on cath 2011 04/05/2012    Past Surgical History  Procedure Date  . Appendectomy   . Patent ductus arterious repair     at age 25  . Colonoscopy w/ polypectomy 03/17/11    diminutive polyp  . Cardiac catheterization 2007    Clean Cardiac Cath [facility]), with RCA 30% narrowing likely due to catheter induced spasm in 09/02/10.  . Back surgery     Family History  Problem Relation Age of Onset  . Colon cancer Paternal Grandfather   . Prostate cancer Paternal Grandfather   . Aneurysm Father   . Heart attack Father 69  . Coronary artery disease Father   . Colon cancer Paternal  Uncle     x 2  . Colon polyps Mother   . Heart disease Mother   . Coronary artery disease Mother   . Breast cancer Maternal Grandmother   . Stomach cancer Brother   . Liver disease      unsure who it was    History  Substance Use Topics  . Smoking status: Never Smoker   . Smokeless tobacco: Never Used  . Alcohol Use: Yes     rare      Review of Systems  Constitutional: Negative for fever and chills.  Gastrointestinal: Negative for nausea and vomiting.  Musculoskeletal: Negative for gait problem.  Skin: Positive for wound. Negative for color change.  Neurological: Negative for numbness.    Allergies  Benadryl and Red blood cells  Home Medications   Current Outpatient Rx  Name Route Sig Dispense Refill  . ALBUTEROL SULFATE HFA 108 (90 BASE) MCG/ACT IN AERS Inhalation Inhale 2 puffs into the lungs every 6 (six) hours as needed. Use with spacer, for asthma    . ALBUTEROL SULFATE (2.5 MG/3ML) 0.083% IN NEBU Nebulization Take 2.5 mg by nebulization every 6 (six) hours as needed. For wheezing    . ASPIRIN 325 MG PO TABS Oral Take 325 mg by mouth daily.    Marland Kitchen BETAMETHASONE DIPROPIONATE 0.05 % EX CREA Topical Apply 1 application topically every evening.    Marland Kitchen OMEGA-3 FATTY  ACIDS 1000 MG PO CAPS Oral Take 1 g by mouth daily.     Marland Kitchen LANSOPRAZOLE 15 MG PO CPDR Oral Take 15 mg by mouth daily.    Marland Kitchen LISINOPRIL 2.5 MG PO TABS Oral Take 2.5 mg by mouth daily.    Marland Kitchen METFORMIN HCL 500 MG PO TABS Oral Take 500 mg by mouth daily with breakfast.    . METOPROLOL TARTRATE 25 MG PO TABS Oral Take 12.5 mg by mouth 2 (two) times daily.    Marland Kitchen NITROGLYCERIN 0.4 MG SL SUBL Sublingual Place 0.4 mg under the tongue every 5 (five) minutes as needed. For chest pain    . ROSUVASTATIN CALCIUM 40 MG PO TABS Oral Take 40 mg by mouth daily.    . TRAMADOL-ACETAMINOPHEN 37.5-325 MG PO TABS Oral Take 1 tablet by mouth every 6 (six) hours as needed. For pain      BP 127/72  Pulse 91  Temp 97.8 F (36.6 C)  (Oral)  Resp 18  SpO2 99%  Physical Exam  Nursing note and vitals reviewed. Constitutional: He appears well-developed and well-nourished. No distress.  HENT:  Head: Normocephalic and atraumatic.  Cardiovascular: Normal rate, regular rhythm and normal heart sounds.   Pulmonary/Chest: Effort normal and breath sounds normal.  Neurological: He is alert. No sensory deficit. Gait normal.  Skin: Skin is warm and dry. He is not diaphoretic.     Psychiatric: He has a normal mood and affect.    ED Course  Procedures (including critical care time)  Labs Reviewed - No data to display No results found.   No diagnosis found.    MDM  Sutures and staples removed.  No signs of infection.  Patient afebrile.  Patient instructed to follow up with PCP.  Return precautions discussed.        Pascal Lux Vinco, PA-C 04/25/12 1704

## 2012-04-25 NOTE — ED Notes (Signed)
NAD noted at time of d/c home with family. D/C inst given by PA-C

## 2012-04-25 NOTE — ED Notes (Signed)
PA in room at this time. Pt here for suture/staple removal from LLE. Area with staples red and warm, nontender, no drainage noted. Wife at bedside.

## 2012-04-25 NOTE — ED Provider Notes (Signed)
Medical screening examination/treatment/procedure(s) were performed by non-physician practitioner and as supervising physician I was immediately available for consultation/collaboration.  Doug Sou, MD 04/25/12 [phone]

## 2012-05-08 ENCOUNTER — Telehealth: Payer: Self-pay | Admitting: *Deleted

## 2012-05-08 NOTE — Telephone Encounter (Signed)
Patient c/o trouble sleeping and is requesting New Rx for Ambien to be px for this issue [states given Xanax in past that helped]; informed that PCP is out of office until Thurs, and may have to wait for his return for approval/SLS Please advise.

## 2012-05-08 NOTE — Telephone Encounter (Signed)
I will defer this to Dr. Rodena Medin.

## 2012-05-10 MED ORDER — ZOLPIDEM TARTRATE 5 MG PO TABS: 5.0000 mg | ORAL_TABLET | Freq: Every evening | ORAL | Status: DC | PRN

## 2012-05-10 NOTE — Telephone Encounter (Signed)
Rx done/SLS 

## 2012-05-10 NOTE — Telephone Encounter (Signed)
Ok for [employer] 5mg  po qhs prn #30

## 2012-06-01 ENCOUNTER — Other Ambulatory Visit (HOSPITAL_COMMUNITY): Payer: Self-pay | Admitting: Family Medicine

## 2012-06-04 ENCOUNTER — Telehealth: Payer: Self-pay | Admitting: Internal Medicine

## 2012-06-04 MED ORDER — LISINOPRIL 2.5 MG PO TABS: 2.5000 mg | ORAL_TABLET | Freq: Every day | ORAL | Status: DC

## 2012-06-04 NOTE — Telephone Encounter (Signed)
Refill- lisinopril 2.5mg  tab. Take one tablet by mouth daily. Qty 30 last fill 8.16.13  Pharmacy comments: cycle fill medication. Authorization is required for next refill. No refills available

## 2012-07-22 ENCOUNTER — Emergency Department (HOSPITAL_COMMUNITY)
Admission: EM | Admit: 2012-07-22 | Discharge: 2012-07-22 | Discharge status: Absent without leave | Payer: Managed Care, Other (non HMO) | Source: Home / Self Care

## 2012-07-23 ENCOUNTER — Ambulatory Visit (HOSPITAL_BASED_OUTPATIENT_CLINIC_OR_DEPARTMENT_OTHER)
Admission: RE | Admit: 2012-07-23 | Discharge: 2012-07-23 | Disposition: A | Discharge status: Home / Self Care | Payer: Managed Care, Other (non HMO) | Source: Ambulatory Visit | Attending: Internal Medicine | Admitting: Internal Medicine

## 2012-07-23 ENCOUNTER — Telehealth: Payer: Self-pay | Admitting: *Deleted

## 2012-07-23 ENCOUNTER — Encounter: Payer: Self-pay | Admitting: Internal Medicine

## 2012-07-23 ENCOUNTER — Ambulatory Visit (INDEPENDENT_AMBULATORY_CARE_PROVIDER_SITE_OTHER): Payer: Managed Care, Other (non HMO) | Admitting: Internal Medicine

## 2012-07-23 VITALS — BP 116/82 | HR 112 | Temp 98.2°F | Resp 16 | Wt 208.0 lb

## 2012-07-23 DIAGNOSIS — E119 Type 2 diabetes mellitus without complications: Secondary | ICD-10-CM

## 2012-07-23 DIAGNOSIS — R079 Chest pain, unspecified: Secondary | ICD-10-CM | POA: Insufficient documentation

## 2012-07-23 DIAGNOSIS — R05 Cough: Secondary | ICD-10-CM

## 2012-07-23 DIAGNOSIS — R059 Cough, unspecified: Secondary | ICD-10-CM

## 2012-07-23 DIAGNOSIS — E785 Hyperlipidemia, unspecified: Secondary | ICD-10-CM

## 2012-07-23 DIAGNOSIS — J40 Bronchitis, not specified as acute or chronic: Secondary | ICD-10-CM

## 2012-07-23 DIAGNOSIS — G4700 Insomnia, unspecified: Secondary | ICD-10-CM

## 2012-07-23 DIAGNOSIS — J209 Acute bronchitis, unspecified: Secondary | ICD-10-CM | POA: Insufficient documentation

## 2012-07-23 DIAGNOSIS — H538 Other visual disturbances: Secondary | ICD-10-CM

## 2012-07-23 MED ORDER — METHYLPREDNISOLONE 4 MG PO KIT: PACK | ORAL | Status: DC

## 2012-07-23 MED ORDER — RAMELTEON 8 MG PO TABS: 8.0000 mg | ORAL_TABLET | Freq: Every day | ORAL | Status: DC

## 2012-07-23 MED ORDER — AMOXICILLIN-POT CLAVULANATE 875-125 MG PO TABS: 1.0000 | ORAL_TABLET | Freq: Two times a day (BID) | ORAL | Status: AC

## 2012-07-23 NOTE — Telephone Encounter (Signed)
[facility] Triage Call Report Triage Record Num: [id number] Operator: Patriciaann Clan Patient Name: Cody Raymond Call Date & Time: 07/22/2012 9:31:17AM Patient Phone: [phone] PCP: Marguarite Arbour Patient Gender: Male PCP Fax : [phone] Patient DOB: 05-09-1971 Practice Name[facility] MRN: [id number] Reason for Call: Caller: Cody Raymond/Patient; PCP: Marguarite Arbour (Adults only); CB#: [phone]; Call regarding Cough/Congestion; Patient states he developed cough, congestion. Onset X 1 week. States expectorating yellow sputum. Afebrile. States he developed wheezing, onset 07/17/12. States wheezing unrelieved by Albuterol Nebulizer and inhaler as prescribed. Patient states he has tried Guaifensin without improvement. Patient is able to speak in full sentences, without difficulty, during call. Triage per Breathing Problems and Diabetes Respiratory Problems Protocol. No emergent sx identified. Disposition of " See Provider with 4 hours" obtained related to positive triage assessment for " New or worsening breathing problems not responding to treatment" and " Productive cough with colored sputum." Care advice given per guidelines. Patient advised increased fluids, warm fluids, inhaled steam, saline nasal washes/netty pot. Patient advised to be evaluated at [facility] now. Advised not to drive self. Patient verbalizes understanding and agreeable. States prefers [facility] in [place] due to location. Protocol(s) Used: Breathing Problems Recommended Outcome per Protocol: See Provider within 4 hours Reason for Outcome: New or worsening breathing problems not responding to treatment or no treatment plan Care Advice: ~ Another adult should drive. Get immediate medical evaluation if you also have COPD, asthma, chronic bronchitis, or are diagnosed with cancer, liver disease, heart failure, renal disease, or cerebrovascular disease, are 96 years of age or older,  or are a smoker. ~ ~ IMMEDIATE ACTION 07/22/2012 10:04:12AM Page 1 of 1 [id number] [facility] Triage Call Report Triage Record Num: [id number] Operator: Patriciaann Clan Patient Name: Cody Raymond Call Date & Time: 07/22/2012 9:31:17AM Patient Phone: [phone] PCP: Marguarite Arbour Patient Gender: Male PCP Fax : [phone] Patient DOB: 30-Aug-1971 Practice Name: [facility] MRN: [id number] Reason for Call: Caller: Cody Raymond/Patient; PCP: Marguarite Arbour (Adults only); CB#: [phone]; Call regarding Cough/Congestion; Patient states he developed cough, congestion. Onset X 1 week. States expectorating yellow sputum. Afebrile. States he developed wheezing, onset 07/17/12. States wheezing unrelieved by Albuterol Nebulizer and inhaler as prescribed. Patient states he has tried Guaifensin without improvement. Patient is able to speak in full sentences, without difficulty, during call. Triage per Breathing Problems and Diabetes Respiratory Problems Protocol. No emergent sx identified. Disposition of " See Provider with 4 hours" obtained related to positive triage assessment for " New or worsening breathing problems not responding to treatment" and " Productive cough with colored sputum." Care advice given per guidelines. Patient advised increased fluids, warm fluids, inhaled steam, saline nasal washes/netty pot. Patient advised to be evaluated at [facility] now. Advised not to drive self. Patient verbalizes understanding and agreeable. States prefers [facility] in [place] due to location. Protocol(s) Used: Diabetes: Respiratory Problems Recommended Outcome per Protocol: See Provider within 4 hours Reason for Outcome: Productive cough with colored sputum (other than clear or white sputum) Care Advice: ~ May inhale steam from hot shower or heated water. Be careful to avoid burns. ~ List, or take, all current prescription(s), nonprescription or alternative  medication(s) to provider for evaluatio

## 2012-07-23 NOTE — Assessment & Plan Note (Signed)
Side effects with Ambien. May attempt over-the-counter Valeriana or melatonin. Otherwise attempt Rozerem when necessary.

## 2012-07-23 NOTE — Assessment & Plan Note (Signed)
Obtain diabetic labs and schedule diabetic eye exam

## 2012-07-23 NOTE — Progress Notes (Signed)
  Subjective:    Patient ID: Cody Raymond, male    DOB: 11-Aug-1971, 41 y.o.   MRN: [id number]  HPI patient presents to clinic for evaluation of cough. Notes 1-1/2 week history of cough and wheezing. Using albuterol without significant improvement. No alleviating or exacerbating factors. Has difficulty falling asleep and had side effects from Ambien. Cannot take over-the-counter Benadryl. Not up to date with diabetic labs.  Past Medical History  Diagnosis Date  . Diabetes mellitus   . Asthma   . Enlarged prostate   . Hyperlipidemia   . Heart disease   . Hypertension   . Blood transfusion   . GERD (gastroesophageal reflux disease)   . Refusal of blood transfusions as patient is Jehovah's Witness   . Neuromuscular disorder     DJD  . Coronary artery disease   . CAD (coronary artery disease), non obstructive on cath 2011 04/05/2012   Past Surgical History  Procedure Date  . Appendectomy   . Patent ductus arterious repair     at age 1  . Colonoscopy w/ polypectomy 03/17/11    diminutive polyp  . Cardiac catheterization 2007    Clean Cardiac Cath [facility] Cards), with RCA 30% narrowing likely due to catheter induced spasm in 09/02/10.  . Back surgery     reports that he has never smoked. He has never used smokeless tobacco. He reports that he drinks alcohol. He reports that he does not use illicit drugs. family history includes Aneurysm in his father; Breast cancer in his maternal grandmother; Colon cancer in his paternal grandfather and paternal uncle; Colon polyps in his mother; Coronary artery disease in his father and mother; Heart attack (age of onset:50) in his father; Heart disease in his mother; Liver disease in an unspecified family member; Prostate cancer in his paternal grandfather; and Stomach cancer in his brother. Allergies  Allergen Reactions  . Benadryl (Diphenhydramine Hcl)     "drives me nuts"  . Red Blood Cells     PT. REFUSES ANY BLOOD PRODUCTS - JEHOVAH'S  WITNESS.     Review of Systems see hpi     Objective:   Physical Exam  Nursing note and vitals reviewed. Constitutional: He appears well-developed and well-nourished. No distress.  HENT:  Head: Normocephalic and atraumatic.  Right Ear: Tympanic membrane, external ear and ear canal normal.  Left Ear: Tympanic membrane, external ear and ear canal normal.  Nose: Nose normal.  Mouth/Throat: Oropharynx is clear and moist. No oropharyngeal exudate.  Eyes: Conjunctivae normal are normal. No scleral icterus.  Pulmonary/Chest: Effort normal. No respiratory distress. He has wheezes. He has no rales.  Neurological: He is alert.  Skin: Skin is warm and dry. He is not diaphoretic.  Psychiatric: He has a normal mood and affect.          Assessment & Plan:

## 2012-07-23 NOTE — Assessment & Plan Note (Signed)
Begin antibiotic therapy and Medrol Dosepak. Obtain chest x-ray. Followup if no improvement or worsening.

## 2012-07-23 NOTE — Patient Instructions (Signed)
You can try over the counter valerian or melatonin for sleep. Neither have benadryl in them. If you fill the prescription for Rozerem then do not take melatonin with it.

## 2012-07-24 ENCOUNTER — Encounter: Payer: Self-pay | Admitting: Internal Medicine

## 2012-07-24 LAB — CBC WITH DIFFERENTIAL/PLATELET
Basophils Absolute: 0 10*3/uL (ref 0.0–0.1)
Basophils Relative: 0 % (ref 0–1)
Lymphocytes Relative: 24 % (ref 12–46)
MCHC: 34.3 g/dL (ref 30.0–36.0)
Monocytes Absolute: 0.6 10*3/uL (ref 0.1–1.0)
Neutro Abs: 4.8 10*3/uL (ref 1.7–7.7)
Neutrophils Relative %: 64 % (ref 43–77)
Platelets: 254 10*3/uL (ref 150–400)
RDW: 17.9 % — ABNORMAL HIGH (ref 11.5–15.5)
WBC: 7.5 10*3/uL (ref 4.0–10.5)

## 2012-07-24 LAB — BASIC METABOLIC PANEL
BUN: 16 mg/dL (ref 6–23)
Chloride: 99 mEq/L (ref 96–112)
Potassium: 4.3 mEq/L (ref 3.5–5.3)
Sodium: 135 mEq/L (ref 135–145)

## 2012-07-24 LAB — HEMOGLOBIN A1C
Hgb A1c MFr Bld: 6.2 % — ABNORMAL HIGH (ref <5.7)
Mean Plasma Glucose: 131 mg/dL — ABNORMAL HIGH (ref <117)

## 2012-07-24 LAB — HEPATIC FUNCTION PANEL
ALT: 22 U/L (ref 0–53)
AST: 20 U/L (ref 0–37)
Albumin: 4.7 g/dL (ref 3.5–5.2)
Alkaline Phosphatase: 104 U/L (ref 39–117)
Total Bilirubin: 0.5 mg/dL (ref 0.3–1.2)

## 2012-07-24 LAB — LIPID PANEL
Cholesterol: 165 mg/dL (ref 0–200)
HDL: 33 mg/dL — ABNORMAL LOW (ref >39)

## 2012-07-31 ENCOUNTER — Encounter: Payer: Self-pay | Admitting: Internal Medicine

## 2012-07-31 DIAGNOSIS — E785 Hyperlipidemia, unspecified: Secondary | ICD-10-CM

## 2012-07-31 NOTE — Telephone Encounter (Signed)
Patient informed, understood & agreed; future lab order placed/SLS  

## 2012-07-31 NOTE — Telephone Encounter (Signed)
Notes Recorded by Regis Bill, CMA on 07/24/2012 at 4:22 PM [facility] with contact name & number RE: results & further information from provider/SLS Notes Recorded by Edwyna Perfect, MD on 07/24/2012 at 8:50 AM a1c 6.2-ok. TG high. Low fat diet/exercise and fish oil daily can help. Please add lipid 272.4 to next visit labs

## 2012-07-31 NOTE — Addendum Note (Signed)
Addended by: Regis Bill on: 07/31/2012 06:07 PM   Modules accepted: Orders

## 2012-09-04 ENCOUNTER — Telehealth: Payer: Self-pay | Admitting: Internal Medicine

## 2012-09-04 MED ORDER — AMOXICILLIN-POT CLAVULANATE 875-125 MG PO TABS: 1.0000 | ORAL_TABLET | Freq: Two times a day (BID) | ORAL | Status: DC

## 2012-09-04 NOTE — Telephone Encounter (Signed)
Cody Raymond 09/04/2012 2:37 PM Signed  Caller: Garo/Patient; Phone: [phone]; Reason for Call: Patient spoke with office staff and "got cut off before I could make appt. " States has had cough and tried OTC medication without relief. Denies wheezing or resp distress; declines new triage; states spoke with office staff and needs appt. Appt scheduled 09/05/12 1315 with Ms. Peggyann Juba krs/can   CAN scheduled OV without reading phone note already opened 12.17.13; awaiting return call from pt RE: OV vs ABX: Spoke with pt about needing OV vs ABX; pt requested to have ABX to pharmacy & cancel appointment scheduled by CAN with Sandford Craze 12.18.13, per Vo TWH, Ok to to send Augmentin [1] BID x7 days, Rx to pharmacy, OV canceled; pt will callback if no better w/ABX//SLS 12.17.13

## 2012-09-04 NOTE — Telephone Encounter (Signed)
Was in two weeks ago for flu like symptoms.  He still feels like he has a lump in his throat and congestion in his lungs.  His asthma is acting up and he has an awful cough

## 2012-09-04 NOTE — Telephone Encounter (Signed)
LMOM with contact name & number for return call RE: inquire as to if patient is requesting Rx and/or Appointment/SLS

## 2012-09-04 NOTE — Telephone Encounter (Signed)
Caller: Harout/Patient; Phone: [phone]; Reason for Call: Patient spoke with office staff and "got cut off before I could make appt.  " States has had cough and tried OTC medication without relief.  Denies wheezing or resp distress; declines new triage; states spoke with office staff and needs appt.  Appt scheduled 09/05/12 1315 with Ms.  Peggyann Juba krs/can

## 2012-09-05 ENCOUNTER — Ambulatory Visit: Payer: Self-pay | Admitting: Family

## 2012-10-04 ENCOUNTER — Other Ambulatory Visit: Payer: Self-pay | Admitting: Internal Medicine

## 2012-10-04 NOTE — Telephone Encounter (Signed)
Rx to pharmacy/SLS 

## 2012-10-22 ENCOUNTER — Ambulatory Visit (INDEPENDENT_AMBULATORY_CARE_PROVIDER_SITE_OTHER): Payer: Managed Care, Other (non HMO) | Admitting: Family

## 2012-10-22 ENCOUNTER — Encounter: Payer: Self-pay | Admitting: Family

## 2012-10-22 VITALS — BP 118/78 | HR 101 | Temp 98.7°F | Resp 16 | Ht 68.0 in | Wt 221.1 lb

## 2012-10-22 DIAGNOSIS — Z23 Encounter for immunization: Secondary | ICD-10-CM

## 2012-10-22 DIAGNOSIS — E119 Type 2 diabetes mellitus without complications: Secondary | ICD-10-CM

## 2012-10-22 DIAGNOSIS — G8929 Other chronic pain: Secondary | ICD-10-CM

## 2012-10-22 DIAGNOSIS — E785 Hyperlipidemia, unspecified: Secondary | ICD-10-CM

## 2012-10-22 DIAGNOSIS — I4891 Unspecified atrial fibrillation: Secondary | ICD-10-CM

## 2012-10-22 DIAGNOSIS — M549 Dorsalgia, unspecified: Secondary | ICD-10-CM

## 2012-10-22 DIAGNOSIS — L909 Atrophic disorder of skin, unspecified: Secondary | ICD-10-CM

## 2012-10-22 DIAGNOSIS — L918 Other hypertrophic disorders of the skin: Secondary | ICD-10-CM

## 2012-10-22 DIAGNOSIS — R002 Palpitations: Secondary | ICD-10-CM

## 2012-10-22 LAB — HEPATIC FUNCTION PANEL
ALT: 25 U/L (ref 0–53)
Albumin: 4.5 g/dL (ref 3.5–5.2)
Total Protein: 7.5 g/dL (ref 6.0–8.3)

## 2012-10-22 LAB — BASIC METABOLIC PANEL
BUN: 15 mg/dL (ref 6–23)
Calcium: 9.5 mg/dL (ref 8.4–10.5)
Creat: 0.92 mg/dL (ref 0.50–1.35)
Glucose, Bld: 112 mg/dL — ABNORMAL HIGH (ref 70–99)

## 2012-10-22 MED ORDER — FLUTICASONE-SALMETEROL 250-50 MCG/DOSE IN AEPB: 1.0000 | INHALATION_SPRAY | Freq: Two times a day (BID) | RESPIRATORY_TRACT | Status: DC

## 2012-10-22 MED ORDER — TRAMADOL-ACETAMINOPHEN 37.5-325 MG PO TABS: 1.0000 | ORAL_TABLET | Freq: Four times a day (QID) | ORAL | Status: DC | PRN

## 2012-10-22 NOTE — Progress Notes (Signed)
Subjective:    Patient ID: Cody Raymond, male    DOB: [birth year], 43 y.o.   MRN: [id number]  HPI  Mr.  Raymond is a 42 yr old male who presents today for follow up.  1)DM2- Reports that he checks his sugar at home and that his fasting sugars as high as 130.    2) Chronic low back pain- He has hx of low back pain workman's comp.  Reports that he was followed with Dr. Darrelyn Raymond to come here. He has hx of bulging disc which bother's him quite a bit.  He reports hx of herniated disc.  Since that time he continues to have low back pain.  Constant.  He is on ultracet 2-3 times a day.  If he is driving he does not take until he gets home.  3) Skin tag- left eyelid.  Reports that it is interfering in his vision.  Has been present for a few months.    4) ?AF-  Saw cardiologist today.  Was told he may have AF and that pcp should draw blood work.  He has a loop monitor placed.       Review of Systems See HPI  Past Medical History  Diagnosis Date  . Diabetes mellitus   . Asthma   . Enlarged prostate   . Hyperlipidemia   . Heart disease   . Hypertension   . Blood transfusion   . GERD (gastroesophageal reflux disease)   . Refusal of blood transfusions as patient is Jehovah's Witness   . Neuromuscular disorder     DJD  . Coronary artery disease   . CAD (coronary artery disease), non obstructive on cath 2011 04/05/2012    History   Social History  . Marital Status: Married    Spouse Name: N/A    Number of Children: N/A  . Years of Education: N/A   Occupational History  . Not on file.   Social History Main Topics  . Smoking status: Never Smoker   . Smokeless tobacco: Never Used  . Alcohol Use: Yes     Comment: rare  . Drug Use: No  . Sexually Active: Not on file   Other Topics Concern  . Not on file   Social History Narrative   Holter monitor 08/2010: PVCs and sinus tachy.Sleep Study (02/2008): mild sleep apnea, no indication for CPAP.    Past Surgical History   Procedure Date  . Appendectomy   . Patent ductus arterious repair     at age 58  . Colonoscopy w/ polypectomy 03/17/11    diminutive polyp  . Cardiac catheterization 2007    Clean Cardiac Cath [facility] Cards), with RCA 30% narrowing likely due to catheter induced spasm in 09/02/10.  . Back surgery     Family History  Problem Relation Age of Onset  . Colon cancer Paternal Grandfather   . Prostate cancer Paternal Grandfather   . Aneurysm Father   . Heart attack Father 5  . Coronary artery disease Father   . Colon cancer Paternal Uncle     x 2  . Colon polyps Mother   . Heart disease Mother   . Coronary artery disease Mother   . Breast cancer Maternal Grandmother   . Stomach cancer Brother   . Liver disease      unsure who it was    Allergies  Allergen Reactions  . Benadryl (Diphenhydramine Hcl)     "drives me nuts"  . Red Blood Cells  PT. REFUSES ANY BLOOD PRODUCTS - JEHOVAH'S WITNESS.    Current Outpatient Prescriptions on File Prior to Visit  Medication Sig Dispense Refill  . albuterol (PROVENTIL HFA;VENTOLIN HFA) 108 (90 BASE) MCG/ACT inhaler Inhale 2 puffs into the lungs every 6 (six) hours as needed. Use with spacer, for asthma      . albuterol (PROVENTIL) (2.5 MG/3ML) 0.083% nebulizer solution Take 2.5 mg by nebulization every 6 (six) hours as needed. For wheezing      . amoxicillin-clavulanate (AUGMENTIN) 875-125 MG per tablet Take 1 tablet by mouth 2 (two) times daily.  14 tablet  0  . aspirin 325 MG tablet Take 325 mg by mouth daily.      . betamethasone dipropionate (DIPROLENE) 0.05 % cream Apply 1 application topically every evening.      . fish oil-omega-3 fatty acids 1000 MG capsule Take 1 g by mouth daily.       . lansoprazole (PREVACID) 15 MG capsule Take 15 mg by mouth daily.      Marland Kitchen lisinopril (PRINIVIL,ZESTRIL) 2.5 MG tablet TAKE 1 TABLET (2.5 MG TOTAL) BY MOUTH DAILY.  90 tablet  1  . metFORMIN (GLUCOPHAGE) 500 MG tablet Take 500 mg by mouth  daily with breakfast.      . methylPREDNISolone (MEDROL, PAK,) 4 MG tablet follow package directions  21 tablet  0  . metoprolol tartrate (LOPRESSOR) 25 MG tablet Take 12.5 mg by mouth 2 (two) times daily.      . nitroGLYCERIN (NITROSTAT) 0.4 MG SL tablet Place 0.4 mg under the tongue every 5 (five) minutes as needed. For chest pain      . ramelteon (ROZEREM) 8 MG tablet Take 1 tablet (8 mg total) by mouth at bedtime.  30 tablet  3  . rosuvastatin (CRESTOR) 40 MG tablet Take 40 mg by mouth daily.      . traMADol-acetaminophen (ULTRACET) 37.5-325 MG per tablet Take 1 tablet by mouth every 6 (six) hours as needed. For pain      . zolpidem (AMBIEN) 5 MG tablet Take 1 tablet (5 mg total) by mouth at bedtime as needed for sleep.  30 tablet  0  . [DISCONTINUED] niacin (NIASPAN) 1000 MG CR tablet Take 1 tablet (1,000 mg total) by mouth at bedtime.  30 tablet  6    BP 118/78  Pulse 101  Temp 98.7 F (37.1 C) (Oral)  Resp 16  Ht 5\' 8"  (1.727 m)  Wt 221 lb 1.3 oz (100.281 kg)  BMI 33.62 kg/m2  SpO2 97%       Objective:   Physical Exam  Constitutional: He is oriented to person, place, and time. He appears well-developed and well-nourished. No distress.  HENT:  Head: Normocephalic and atraumatic.  Skin tag on left eyelid.   Cardiovascular: Normal rate and regular rhythm.   No murmur heard. Pulmonary/Chest: Breath sounds normal. No respiratory distress. He has no wheezes. He has no rales. He exhibits no tenderness.  Lymphadenopathy:    He has no cervical adenopathy.  Neurological: He is alert and oriented to person, place, and time.  Skin: Skin is warm and dry.  Psychiatric: He has a normal mood and affect. His behavior is normal. Judgment and thought content normal.          Assessment & Plan:

## 2012-10-22 NOTE — Patient Instructions (Addendum)
Please complete your lab work prior to leaving.  Follow up in 3 months, sooner if problems/concerns. You will be contact about your referral to dermatology. Please let us know if you have not heard back within 1 week about your referral.

## 2012-10-23 LAB — CBC WITH DIFFERENTIAL/PLATELET
Basophils Absolute: 0 10*3/uL (ref 0.0–0.1)
Basophils Relative: 1 % (ref 0–1)
Eosinophils Relative: 1 % (ref 0–5)
HCT: 42.8 % (ref 39.0–52.0)
MCHC: 34.1 g/dL (ref 30.0–36.0)
MCV: 77.5 fL — ABNORMAL LOW (ref 78.0–100.0)
Monocytes Absolute: 0.5 10*3/uL (ref 0.1–1.0)
Neutro Abs: 4.7 10*3/uL (ref 1.7–7.7)
Platelets: 251 10*3/uL (ref 150–400)
RDW: 17.7 % — ABNORMAL HIGH (ref 11.5–15.5)

## 2012-10-23 LAB — HEMOGLOBIN A1C: Hgb A1c MFr Bld: 6 % — ABNORMAL HIGH (ref <5.7)

## 2012-10-23 LAB — MICROALBUMIN / CREATININE URINE RATIO
Creatinine, Urine: 204.1 mg/dL
Microalb, Ur: 0.83 mg/dL (ref 0.00–1.89)

## 2012-10-25 ENCOUNTER — Encounter: Payer: Self-pay | Admitting: Family

## 2012-10-27 DIAGNOSIS — L918 Other hypertrophic disorders of the skin: Secondary | ICD-10-CM | POA: Insufficient documentation

## 2012-10-27 NOTE — Assessment & Plan Note (Signed)
A1C is 6.0.  Continue metformin.

## 2012-10-27 NOTE — Assessment & Plan Note (Signed)
Skin tag on eye- refer to dermatology for removal.

## 2012-10-27 NOTE — Assessment & Plan Note (Signed)
Refill was provided for ultracet.

## 2012-10-27 NOTE — Assessment & Plan Note (Addendum)
Obtain TSH and BMET to further evaluate per cardiology request. He has loop monitor in place.

## 2012-12-12 ENCOUNTER — Other Ambulatory Visit: Payer: Self-pay | Admitting: Family

## 2012-12-21 ENCOUNTER — Ambulatory Visit (INDEPENDENT_AMBULATORY_CARE_PROVIDER_SITE_OTHER): Payer: Managed Care, Other (non HMO) | Admitting: Family

## 2012-12-21 ENCOUNTER — Encounter: Payer: Self-pay | Admitting: Family

## 2012-12-21 VITALS — BP 110/80 | HR 107 | Temp 98.8°F | Resp 16 | Wt 224.1 lb

## 2012-12-21 DIAGNOSIS — R109 Unspecified abdominal pain: Secondary | ICD-10-CM

## 2012-12-21 DIAGNOSIS — F329 Major depressive disorder, single episode, unspecified: Secondary | ICD-10-CM

## 2012-12-21 DIAGNOSIS — R1030 Lower abdominal pain, unspecified: Secondary | ICD-10-CM

## 2012-12-21 DIAGNOSIS — M545 Low back pain: Secondary | ICD-10-CM

## 2012-12-21 DIAGNOSIS — M549 Dorsalgia, unspecified: Secondary | ICD-10-CM

## 2012-12-21 DIAGNOSIS — R319 Hematuria, unspecified: Secondary | ICD-10-CM

## 2012-12-21 DIAGNOSIS — G8929 Other chronic pain: Secondary | ICD-10-CM

## 2012-12-21 DIAGNOSIS — F418 Other specified anxiety disorders: Secondary | ICD-10-CM | POA: Insufficient documentation

## 2012-12-21 DIAGNOSIS — J322 Chronic ethmoidal sinusitis: Secondary | ICD-10-CM

## 2012-12-21 LAB — POCT URINALYSIS DIPSTICK
Blood, UA: NEGATIVE
Glucose, UA: NEGATIVE
Spec Grav, UA: 1.025
Urobilinogen, UA: NEGATIVE

## 2012-12-21 MED ORDER — PREDNISONE 10 MG PO TABS: ORAL_TABLET | ORAL | Status: DC

## 2012-12-21 MED ORDER — ZOLPIDEM TARTRATE 10 MG PO TABS: 10.0000 mg | ORAL_TABLET | Freq: Every evening | ORAL | Status: DC | PRN

## 2012-12-21 MED ORDER — AMOXICILLIN-POT CLAVULANATE 875-125 MG PO TABS: 1.0000 | ORAL_TABLET | Freq: Two times a day (BID) | ORAL | Status: DC

## 2012-12-21 MED ORDER — SERTRALINE HCL 50 MG PO TABS: 50.0000 mg | ORAL_TABLET | Freq: Every day | ORAL | Status: DC

## 2012-12-21 MED ORDER — TAMSULOSIN HCL 0.4 MG PO CAPS: 0.4000 mg | ORAL_CAPSULE | Freq: Every day | ORAL | Status: DC

## 2012-12-21 NOTE — Assessment & Plan Note (Signed)
Refer to pain management 

## 2012-12-21 NOTE — Assessment & Plan Note (Signed)
Will rx with augmentin.  

## 2012-12-21 NOTE — Patient Instructions (Signed)
You will be contacted about your CT scan.  You will be contacted about your referral to pain management.  Please let us know if you have not heard back within 1 week about your referral. Sertraline (zoloft) 50mg , start 1/2 tablet by mouth daily first week, increase to full tablet daily on week two. Follow up in 1 month. Call if cough/sinus symptoms, or depression worsen or do not improve.

## 2012-12-21 NOTE — Assessment & Plan Note (Signed)
?   Inguinal hernia. Refer for CT for further evaluation.

## 2012-12-21 NOTE — Progress Notes (Signed)
Subjective:    Patient ID: Cody Raymond, male    DOB: 1971/02/07, 42 y.o.   MRN: [id number]  HPI  Cody Raymond is a 42 yr old male who presents today with several concerns:  Nasal congestion- reports that this started 2 weeks ago. Feels a lot of drainage in the back of his throat.  He started zyrtec and an otc nasal spray.  Denies fever. Drainage is yellow.  Feels sinus pain in the forehead.  Trying mucinex without improvement.    Groin Pain Cody Raymond states he completed his prostate medication and is now having pain left inguinal area. Reports that he will feel it with laying in bed, or with turing.  Reports that this has been bothering him for a month.  He reports hx of enlarged prostate and was treated with ?flomax which he stopped.  Reports that he has frequency which he attributes to his diabetes.  He reports 3.5 weeks ago had "excruciating pain."  Had one episode of gross hematuria.    Back Pain- Cody Raymond continues to have low back pain; s/p surgery 1 1/2 yrs ago ( Dr. Darrelyn Hillock). Feels that the back pain is getting "stressful."  Feels like this is affecting his day to day. Pain is located in the lower back.  Pain radiates into the right leg.  Takes tramadol in the afternoon after he is done with driving. Not sleeping well due to pain.  He is trying to get SSI.  He is unable to work due his pain.  Has not worked in 2 years.  He would like a referral to pain management.   Sleeping Problem- Cody Raymond requests to try xanax to help him sleep instead of zolpidem. States his back pain keeps him up at night.  Depression- sometimes doesn't want to get out of bed.  Feels down about his inability to work. Tells me that his wife is concerned about him.   Review of Systems See HPI  Past Medical History  Diagnosis Date  . Diabetes mellitus   . Asthma   . Enlarged prostate   . Hyperlipidemia   . Heart disease   . Hypertension   . Blood transfusion   . GERD (gastroesophageal reflux disease)   . Refusal of blood  transfusions as patient is Jehovah's Witness   . Neuromuscular disorder     DJD  . Coronary artery disease   . CAD (coronary artery disease), non obstructive on cath 2011 04/05/2012    History   Social History  . Marital Status: Married    Spouse Name: N/A    Number of Children: N/A  . Years of Education: N/A   Occupational History  . Not on file.   Social History Main Topics  . Smoking status: Never Smoker   . Smokeless tobacco: Never Used  . Alcohol Use: Yes     Comment: rare  . Drug Use: No  . Sexually Active: Not on file   Other Topics Concern  . Not on file   Social History Narrative   Holter monitor 08/2010: PVCs and sinus tachy.   Sleep Study (02/2008): mild sleep apnea, no indication for CPAP.    Past Surgical History  Procedure Laterality Date  . Appendectomy    . Patent ductus arterious repair      at age 39  . Colonoscopy w/ polypectomy  03/17/11    diminutive polyp  . Cardiac catheterization  2007    Clean Cardiac Cath [facility] Cards), with RCA 30% narrowing likely due  to catheter induced spasm in 09/02/10.  . Back surgery      Family History  Problem Relation Age of Onset  . Colon cancer Paternal Grandfather   . Prostate cancer Paternal Grandfather   . Aneurysm Father   . Heart attack Father 62  . Coronary artery disease Father   . Colon cancer Paternal Uncle     x 2  . Colon polyps Mother   . Heart disease Mother   . Coronary artery disease Mother   . Breast cancer Maternal Grandmother   . Stomach cancer Brother   . Liver disease      unsure who it was    Allergies  Allergen Reactions  . Benadryl (Diphenhydramine Hcl)     "drives me nuts"  . Red Blood Cells     Cody Raymond. REFUSES ANY BLOOD PRODUCTS - JEHOVAH'S WITNESS.    Current Outpatient Prescriptions on File Prior to Visit  Medication Sig Dispense Refill  . albuterol (PROVENTIL HFA;VENTOLIN HFA) 108 (90 BASE) MCG/ACT inhaler Inhale 2 puffs into the lungs every 6 (six) hours as  needed. Use with spacer, for asthma      . albuterol (PROVENTIL) (2.5 MG/3ML) 0.083% nebulizer solution Take 2.5 mg by nebulization every 6 (six) hours as needed. For wheezing      . aspirin 325 MG tablet Take 325 mg by mouth daily.      . betamethasone dipropionate (DIPROLENE) 0.05 % cream Apply 1 application topically every evening.      . fish oil-omega-3 fatty acids 1000 MG capsule Take 1 g by mouth daily.       . Fluticasone-Salmeterol (ADVAIR DISKUS) 250-50 MCG/DOSE AEPB Inhale 1 puff into the lungs 2 (two) times daily.  1 each  3  . lansoprazole (PREVACID) 15 MG capsule Take 15 mg by mouth daily.      Marland Kitchen lisinopril (PRINIVIL,ZESTRIL) 2.5 MG tablet TAKE 1 TABLET (2.5 MG TOTAL) BY MOUTH DAILY.  90 tablet  1  . metFORMIN (GLUCOPHAGE) 500 MG tablet Take 500 mg by mouth daily with breakfast.      . metoprolol tartrate (LOPRESSOR) 25 MG tablet Take 12.5 mg by mouth 2 (two) times daily.      . nitroGLYCERIN (NITROSTAT) 0.4 MG SL tablet Place 0.4 mg under the tongue every 5 (five) minutes as needed. For chest pain      . ramelteon (ROZEREM) 8 MG tablet Take 1 tablet (8 mg total) by mouth at bedtime.  30 tablet  3  . rosuvastatin (CRESTOR) 40 MG tablet Take 40 mg by mouth daily.      . traMADol-acetaminophen (ULTRACET) 37.5-325 MG per tablet TAKE 1 TABLET BY MOUTH EVERY 6 HOURS AS NEEDED FOR PAIN  90 tablet  0  . [DISCONTINUED] niacin (NIASPAN) 1000 MG CR tablet Take 1 tablet (1,000 mg total) by mouth at bedtime.  30 tablet  6   No current facility-administered medications on file prior to visit.    BP 110/80  Pulse 107  Temp(Src) 98.8 F (37.1 C) (Oral)  Resp 16  Wt 224 lb 1.3 oz (101.642 kg)  BMI 34.08 kg/m2  SpO2 99%       Objective:   Physical Exam  Constitutional: He appears well-developed and well-nourished. No distress.  Cardiovascular: Normal rate and regular rhythm.   No murmur heard. Pulmonary/Chest: Effort normal and breath sounds normal. No respiratory distress. He has no  wheezes. He has no rales. He exhibits no tenderness.  Genitourinary: Penis normal.  No palpable hernias  in the inguinal canal, but Cody Raymond had significant discomfort with this portion of the exam.   Psychiatric: His speech is normal and behavior is normal. Judgment and thought content normal. Cognition and memory are normal.  Slightly flat affect.            Assessment & Plan:

## 2012-12-21 NOTE — Assessment & Plan Note (Addendum)
We discussed referral to a therapist.  He declines at this time.  He is agreeable to a trial of sertraline 50mg .    I instructed pt to start 1/2 tablet once daily for 1 week and then increase to a full tablet once daily on week two as tolerated.  We discussed common side effects such as nausea, drowsiness and weight gain.  Also discussed rare but serious side effect of suicide ideation.  He is instructed to discontinue medication go directly to ED if this occurs.  Pt verbalizes understanding.  I am hopeful this will also help with his insomnia. Will increase ambien from 5mg  to 10 mg. Plan follow up in 1 month to evaluate progress.

## 2012-12-23 LAB — URINE CULTURE: Colony Count: 15000

## 2012-12-24 ENCOUNTER — Telehealth: Payer: Self-pay | Admitting: Family

## 2012-12-24 DIAGNOSIS — R1030 Lower abdominal pain, unspecified: Secondary | ICD-10-CM

## 2012-12-24 MED ORDER — CIPROFLOXACIN HCL 500 MG PO TABS: 500.0000 mg | ORAL_TABLET | Freq: Two times a day (BID) | ORAL | Status: DC

## 2012-12-24 NOTE — Telephone Encounter (Signed)
Left detailed message on pt's cell# and to call if any questions. 

## 2012-12-24 NOTE — Telephone Encounter (Signed)
Pls call [name] and let him know that urine grew a small amount of bacteria. I would like him to take cipro x 3 days. rx has been sent.

## 2012-12-24 NOTE — Telephone Encounter (Signed)
[facility] is requesting an order for CT with and without and an order for BMET.

## 2012-12-26 ENCOUNTER — Telehealth: Payer: Self-pay | Admitting: *Deleted

## 2012-12-26 ENCOUNTER — Telehealth: Payer: Self-pay | Admitting: Family

## 2012-12-26 NOTE — Telephone Encounter (Signed)
Patient is requesting results from this morning

## 2012-12-26 NOTE — Telephone Encounter (Signed)
Pt came into office today after having MRI and being taken off Metformin for 48 hrs post procedure [standard kidney caution], reporting that he is on Prednisone, which is raising CBGs >200 w/Metformin; request what can be done to accommodate the void period of Metformin to keep blood sugars stable. Per Sandford Craze, FNP; gave pt Januvia 100 mg samples [Lot [id number] Exp: 06.2016] to tkkae [1] tablet daily for the next 48-hr period [until resuming Metformin] and instructed pt to monitor CBGs/SLS

## 2012-12-27 ENCOUNTER — Telehealth: Payer: Self-pay | Admitting: *Deleted

## 2012-12-27 NOTE — Telephone Encounter (Signed)
Please advise/SLS 

## 2012-12-27 NOTE — Telephone Encounter (Signed)
Pt called wanting to know if his prostate was still enlarged. Advised him that Provider did not mention enlarged prostate after reviewing CT. Pt wants to know if he should start flomax if prostate is not enlarged?  Thinks that his frequent urination is coming from his blood sugar.  Please advise.

## 2012-12-27 NOTE — Telephone Encounter (Signed)
Not yet available. Will call him when receive.

## 2012-12-27 NOTE — Telephone Encounter (Signed)
Per PCP, called pt & LMOM with contact name and number for return call RE: CT results and further provider instructions-"CT negative for Hernia, did show fatty liver-adhere to low fat diet & exercise plan, pain most likely musculoskeletal"/SLS

## 2012-12-28 ENCOUNTER — Other Ambulatory Visit: Payer: Self-pay | Admitting: Internal Medicine

## 2012-12-28 NOTE — Telephone Encounter (Signed)
See My chart message

## 2012-12-28 NOTE — Telephone Encounter (Signed)
Denial--Too soon for request-Last Rx 01.16.14 #90x1-should have available refill/SLS

## 2013-01-02 NOTE — Telephone Encounter (Signed)
Received notice that pt has not read the "mychart" message below. Called pt and advised him verbally per instructions below. He reports that he has been taking flomax and has not noted any improvement in his frequent urination.   Cody Raymond,   I received your message. I reviewed your report and sent is for scanning already so it is in medical records department and I can not yet pull up in your electronic record. However, CT has been sent to scanning. I do not recall note from radiologist re: enlarged prostate on the report. It is most likely that frequent urination is diabetes related, however he can try the flomax and see if this helps.   Thanks,   [employer]

## 2013-01-08 ENCOUNTER — Encounter: Payer: Self-pay | Admitting: Physical Medicine & Rehabilitation

## 2013-01-11 ENCOUNTER — Encounter: Payer: Self-pay | Admitting: Family Medicine

## 2013-01-17 ENCOUNTER — Other Ambulatory Visit: Payer: Self-pay | Admitting: Family

## 2013-01-18 ENCOUNTER — Other Ambulatory Visit: Payer: Self-pay | Admitting: Family

## 2013-01-18 NOTE — Telephone Encounter (Signed)
Sertraline request [Last Rx 04.04.14 #30x0]/SLS Ultracet request [Last Rx 03.26.14 #90x0]/SLS Please advise.

## 2013-01-18 NOTE — Telephone Encounter (Signed)
Medication name:  Name from pharmacy:  traMADol-acetaminophen (ULTRACET) 37.5-325 MG per tablet  TRAMADOL HCL/APAP 37.5-325MG  TAB The source prescription has been discontinued. Sig: TAKE 1 TABLET BY MOUTH EVERY 6 HOURS AS NEEDED FOR PAIN Dispense: 90 tablet Start: 01/17/2013 Class: Normal Notes to pharmacy: CYCLE FILL MEDICATION. Authorization is required for next refill. Requested on: 12/12/2012 Originally ordered on: 04/04/2012 Last refill: 12/12/2012

## 2013-01-18 NOTE — Telephone Encounter (Signed)
Please advise refill on tramadol? Last refill on 12/12/12, #30 x 0.

## 2013-01-28 ENCOUNTER — Telehealth: Payer: Self-pay | Admitting: Family

## 2013-01-28 NOTE — Telephone Encounter (Signed)
Patient states that he would like to proceed with psychiatry referral regarding his zoloft medication. He states that he is almost out of medication and would like an appointment with psychiatry soon

## 2013-01-29 ENCOUNTER — Encounter: Payer: Self-pay | Admitting: Physical Medicine & Rehabilitation

## 2013-01-29 ENCOUNTER — Ambulatory Visit (HOSPITAL_BASED_OUTPATIENT_CLINIC_OR_DEPARTMENT_OTHER): Payer: Managed Care, Other (non HMO) | Admitting: Physical Medicine & Rehabilitation

## 2013-01-29 ENCOUNTER — Encounter: Payer: Managed Care, Other (non HMO) | Attending: Physical Medicine & Rehabilitation

## 2013-01-29 VITALS — BP 133/76 | HR 95 | Resp 14 | Ht 68.0 in | Wt 229.0 lb

## 2013-01-29 DIAGNOSIS — M961 Postlaminectomy syndrome, not elsewhere classified: Secondary | ICD-10-CM

## 2013-01-29 DIAGNOSIS — M79609 Pain in unspecified limb: Secondary | ICD-10-CM | POA: Insufficient documentation

## 2013-01-29 DIAGNOSIS — IMO0002 Reserved for concepts with insufficient information to code with codable children: Secondary | ICD-10-CM

## 2013-01-29 DIAGNOSIS — I2510 Atherosclerotic heart disease of native coronary artery without angina pectoris: Secondary | ICD-10-CM | POA: Insufficient documentation

## 2013-01-29 DIAGNOSIS — E119 Type 2 diabetes mellitus without complications: Secondary | ICD-10-CM | POA: Insufficient documentation

## 2013-01-29 DIAGNOSIS — G8928 Other chronic postprocedural pain: Secondary | ICD-10-CM | POA: Insufficient documentation

## 2013-01-29 DIAGNOSIS — E785 Hyperlipidemia, unspecified: Secondary | ICD-10-CM | POA: Insufficient documentation

## 2013-01-29 DIAGNOSIS — Z79899 Other long term (current) drug therapy: Secondary | ICD-10-CM | POA: Insufficient documentation

## 2013-01-29 DIAGNOSIS — K219 Gastro-esophageal reflux disease without esophagitis: Secondary | ICD-10-CM | POA: Insufficient documentation

## 2013-01-29 DIAGNOSIS — I10 Essential (primary) hypertension: Secondary | ICD-10-CM | POA: Insufficient documentation

## 2013-01-29 MED ORDER — GABAPENTIN 100 MG PO CAPS: 100.0000 mg | ORAL_CAPSULE | Freq: Three times a day (TID) | ORAL | Status: DC

## 2013-01-29 MED ORDER — SERTRALINE HCL 50 MG PO TABS: ORAL_TABLET | ORAL | Status: DC

## 2013-01-29 NOTE — Telephone Encounter (Signed)
Lets see him back in the office first to re-evaluate.  We had spoken about referral to a therapist.  I can arranged this if that is what he means.  OK to send 1 month supply of zoloft to pharmacy.

## 2013-01-29 NOTE — Patient Instructions (Addendum)
New medicine gabapentin for shooting pains in the legs. We are starting a small dose 100 mg but may need to increase over time Handicapped temporary parking form please take the [facility] [place] [facility]  [facility] [address] [place] [address] [phone]

## 2013-01-29 NOTE — Progress Notes (Signed)
Subjective:    Patient ID: Cody Raymond, male    DOB: 1971-02-14, 42 y.o.   MRN: [id number] Work-related injury 2012 now settled HPI 06/03/2011 Decompressive laminectomy as well as microdiscectomy and foraminotomies right L4-L5. Had preoperative ankle dorsiflexor and toe extensor weakness. Dr Darrelyn Hillock and [place] Patient was given permanent restrictions by orthopedics no lifting greater than 5 pounds no stooping or bending Has gone through PT has had an FCE although I do not have the records for all this.  Had epidural injection didn't help plus Caused elevated blood sugars Right Achilles pain radiates pain up toward toward back.   Pain Inventory Average Pain 9 Pain Right Now 9 My pain is sharp, stabbing, tingling and aching  In the last 24 hours, has pain interfered with the following? General activity 10 Relation with others 9 Enjoyment of life 9 What TIME of day is your pain at its worst? all Sleep (in general) Fair  Pain is worse with: walking, bending, sitting and standing Pain improves with: therapy/exercise and medication Relief from Meds: 1  Mobility walk without assistance use a cane how many minutes can you walk? 15-20 ability to climb steps?  yes do you drive?  yes  Function disabled: date disabled applied I need assistance with the following:  bathing, meal prep, household duties and shopping  Neuro/Psych weakness numbness tremor tingling spasms dizziness depression anxiety  Prior Studies Any changes since last visit?  yes has had but needs to bring  Physicians involved in your care Orthopedist gioffree   Family History  Problem Relation Age of Onset  . Colon cancer Paternal Grandfather   . Prostate cancer Paternal Grandfather   . Aneurysm Father   . Heart attack Father 45  . Coronary artery disease Father   . Colon cancer Paternal Uncle     x 2  . Colon polyps Mother   . Heart disease Mother   . Coronary artery disease Mother    . Breast cancer Maternal Grandmother   . Stomach cancer Brother   . Liver disease      unsure who it was   History   Social History  . Marital Status: Married    Spouse Name: N/A    Number of Children: N/A  . Years of Education: N/A   Social History Main Topics  . Smoking status: Never Smoker   . Smokeless tobacco: Never Used  . Alcohol Use: Yes     Comment: rare  . Drug Use: No  . Sexually Active: None   Other Topics Concern  . None   Social History Narrative   Holter monitor 08/2010: PVCs and sinus tachy.   Sleep Study (02/2008): mild sleep apnea, no indication for CPAP.   Past Surgical History  Procedure Laterality Date  . Appendectomy    . Patent ductus arterious repair      at age 71  . Colonoscopy w/ polypectomy  03/17/11    diminutive polyp  . Cardiac catheterization  2007    [facility] Cardiac Cath [facility]), with RCA 30% narrowing likely due to catheter induced spasm in 09/02/10.  . Back surgery     Past Medical History  Diagnosis Date  . Diabetes mellitus   . Asthma   . Enlarged prostate   . Hyperlipidemia   . Heart disease   . Hypertension   . Blood transfusion   . GERD (gastroesophageal reflux disease)   . Refusal of blood transfusions as patient is Jehovah's Witness   . Neuromuscular  disorder     DJD  . Coronary artery disease   . CAD (coronary artery disease), non obstructive on cath 2011 04/05/2012   BP 133/76  Pulse 95  Resp 14  Ht 5\' 8"  (1.727 m)  Wt 229 lb (103.874 kg)  BMI 34.83 kg/m2  SpO2 97%     Review of Systems  Constitutional: Positive for diaphoresis.  Respiratory: Positive for cough, shortness of breath and wheezing.   Gastrointestinal: Positive for nausea, vomiting and abdominal pain.  Musculoskeletal: Positive for back pain and gait problem.       Spasms  Neurological: Positive for dizziness, tremors, weakness and numbness.       Tingling  Psychiatric/Behavioral: Positive for dysphoric mood. The patient is  nervous/anxious.   All other systems reviewed and are negative.       Objective:   Physical Exam  Nursing note and vitals reviewed. Constitutional: He appears well-developed and well-nourished.  HENT:  Head: Normocephalic.  Eyes: Conjunctivae and EOM are normal. Pupils are equal, round, and reactive to light.  Musculoskeletal:       Lumbar back: He exhibits decreased range of motion, tenderness and deformity. He exhibits no spasm.  Range of motion Limited to 25% for flexion extension lateral tissue and bending Tenderness palpation bilateral paraspinals L4 L5-S1 level  Neurological: He has normal strength. A sensory deficit is present. Gait normal.  Decreased sensation right L3 L4-L5, S1 dermatome  Negative straight leg raise test  Psychiatric: He has a normal mood and affect.          Assessment & Plan:  1. Lumbar postlaminectomy syndrome with chronic postoperative pain. He has primarily neurogenic pain in the right lower extremity. He gets some partial relief with tramadol but has not tried any medication such as gabapentin or pregabalin. We'll start gabapentin 100 mg 3 times a day and titrate upward. He also has limitation range of motion I think he would benefit from aquatic therapy as he failed to tolerate land-based physical therapy in the past. At this point we'll hold off on any type of injections. Discussed with patient agrees with plan. Do not think narcotic analgesics will be needed therefore have not ordered urine drug screen.

## 2013-01-29 NOTE — Telephone Encounter (Signed)
Patient advised and rx sent in to pharmacy.

## 2013-01-30 ENCOUNTER — Encounter: Payer: Self-pay | Admitting: Family

## 2013-01-30 ENCOUNTER — Ambulatory Visit (INDEPENDENT_AMBULATORY_CARE_PROVIDER_SITE_OTHER): Payer: Managed Care, Other (non HMO) | Admitting: Family

## 2013-01-30 VITALS — BP 106/80 | HR 104 | Temp 98.0°F | Resp 16 | Ht 68.0 in | Wt 228.0 lb

## 2013-01-30 DIAGNOSIS — F329 Major depressive disorder, single episode, unspecified: Secondary | ICD-10-CM

## 2013-01-30 MED ORDER — SERTRALINE HCL 50 MG PO TABS: ORAL_TABLET | ORAL | Status: DC

## 2013-01-30 NOTE — Assessment & Plan Note (Addendum)
Improved. Continue current dose of zoloft. He would like referral to a therapist. Will refer to Dr. Dellia Cloud. 15 minutes spent with pt today.  >50 % of this time was spent counseling pt on his depression.

## 2013-01-30 NOTE — Patient Instructions (Addendum)
You will be contacted about your referral to the therapist.  Please let us know if you have not heard back within 1 week about your referral.  

## 2013-01-30 NOTE — Progress Notes (Signed)
Subjective:    Patient ID: Cody Raymond, male    DOB: 1971/06/22, 42 y.o.   MRN: [id number]  HPI  Mr. Orantes is  42 yr old male who presents today for follow up of his depression.  Last visit he was started on zoloft. He reports side effects of mild dizziness/diarrhea. He does not feel that these side effects are bothersome enough to stop the medicine however.  Notes that the zoloft makes him feel calmer.  He has had some recent stressors: Brother went to jail, nephew ran away, (he has taken his 45 yr old nephew in), and his mother is having heart issues.  Seems to be a littler easier to get out of bed in the mornings- but still has some hard days.  Goes to the gym or go for a walk.   He would like to meet with a therapist. Would like to continue current dose of zoloft.   Met Dr. Wynn Banker yesterday.  Friday he will start exercising at an aquatic center.     Review of Systems See HPI  Past Medical History  Diagnosis Date  . Diabetes mellitus   . Asthma   . Enlarged prostate   . Hyperlipidemia   . Heart disease   . Hypertension   . Blood transfusion   . GERD (gastroesophageal reflux disease)   . Refusal of blood transfusions as patient is Jehovah's Witness   . Neuromuscular disorder     DJD  . Coronary artery disease   . CAD (coronary artery disease), non obstructive on cath 2011 04/05/2012    History   Social History  . Marital Status: Married    Spouse Name: N/A    Number of Children: N/A  . Years of Education: N/A   Occupational History  . Not on file.   Social History Main Topics  . Smoking status: Never Smoker   . Smokeless tobacco: Never Used  . Alcohol Use: Yes     Comment: rare  . Drug Use: No  . Sexually Active: Not on file   Other Topics Concern  . Not on file   Social History Narrative   Holter monitor 08/2010: PVCs and sinus tachy.   Sleep Study (02/2008): mild sleep apnea, no indication for CPAP.    Past Surgical History  Procedure Laterality  Date  . Appendectomy    . Patent ductus arterious repair      at age 31  . Colonoscopy w/ polypectomy  03/17/11    diminutive polyp  . Cardiac catheterization  2007    [facility] Cardiac Cath [facility]), with RCA 30% narrowing likely due to catheter induced spasm in 09/02/10.  . Back surgery      Family History  Problem Relation Age of Onset  . Colon cancer Paternal Grandfather   . Prostate cancer Paternal Grandfather   . Aneurysm Father   . Heart attack Father 42  . Coronary artery disease Father   . Colon cancer Paternal Uncle     x 2  . Colon polyps Mother   . Heart disease Mother   . Coronary artery disease Mother   . Breast cancer Maternal Grandmother   . Stomach cancer Brother   . Liver disease      unsure who it was    Allergies  Allergen Reactions  . Benadryl (Diphenhydramine Hcl)     "drives me nuts"  . Red Blood Cells     PT. REFUSES ANY BLOOD PRODUCTS - JEHOVAH'S WITNESS.  Current Outpatient Prescriptions on File Prior to Visit  Medication Sig Dispense Refill  . albuterol (PROVENTIL HFA;VENTOLIN HFA) 108 (90 BASE) MCG/ACT inhaler Inhale 2 puffs into the lungs every 6 (six) hours as needed. Use with spacer, for asthma      . albuterol (PROVENTIL) (2.5 MG/3ML) 0.083% nebulizer solution Take 2.5 mg by nebulization every 6 (six) hours as needed. For wheezing      . aspirin 325 MG tablet Take 325 mg by mouth daily.      . betamethasone dipropionate (DIPROLENE) 0.05 % cream Apply 1 application topically every evening.      . cetirizine (ZYRTEC) 10 MG tablet Take 10 mg by mouth daily.      . ciprofloxacin (CIPRO) 500 MG tablet Take 1 tablet (500 mg total) by mouth 2 (two) times daily.  6 tablet  0  . fish oil-omega-3 fatty acids 1000 MG capsule Take 1 g by mouth daily.       . Fluticasone-Salmeterol (ADVAIR DISKUS) 250-50 MCG/DOSE AEPB Inhale 1 puff into the lungs 2 (two) times daily.  1 each  3  . gabapentin (NEURONTIN) 100 MG capsule Take 1 capsule (100 mg  total) by mouth 3 (three) times daily.  90 capsule  1  . lansoprazole (PREVACID) 15 MG capsule Take 15 mg by mouth daily.      Marland Kitchen lisinopril (PRINIVIL,ZESTRIL) 2.5 MG tablet TAKE 1 TABLET (2.5 MG TOTAL) BY MOUTH DAILY.  90 tablet  1  . metFORMIN (GLUCOPHAGE) 500 MG tablet Take 500 mg by mouth daily with breakfast.      . metoprolol tartrate (LOPRESSOR) 25 MG tablet Take 12.5 mg by mouth 2 (two) times daily.      . nitroGLYCERIN (NITROSTAT) 0.4 MG SL tablet Place 0.4 mg under the tongue every 5 (five) minutes as needed. For chest pain      . ramelteon (ROZEREM) 8 MG tablet Take 1 tablet (8 mg total) by mouth at bedtime.  30 tablet  3  . rosuvastatin (CRESTOR) 40 MG tablet Take 40 mg by mouth daily.      . tamsulosin (FLOMAX) 0.4 MG CAPS Take 1 capsule (0.4 mg total) by mouth daily.  30 capsule  3  . traMADol-acetaminophen (ULTRACET) 37.5-325 MG per tablet TAKE 1 TABLET BY MOUTH EVERY 6 HOURS AS NEEDED FOR PAIN  90 tablet  0  . zolpidem (AMBIEN) 10 MG tablet Take 1 tablet (10 mg total) by mouth at bedtime as needed for sleep.  30 tablet  0  . [DISCONTINUED] niacin (NIASPAN) 1000 MG CR tablet Take 1 tablet (1,000 mg total) by mouth at bedtime.  30 tablet  6   No current facility-administered medications on file prior to visit.    BP 106/80  Pulse 104  Temp(Src) 98 F (36.7 C) (Oral)  Resp 16  Ht 5\' 8"  (1.727 m)  Wt 228 lb (103.42 kg)  BMI 34.68 kg/m2  SpO2 97%       Objective:   Physical Exam  Constitutional: He is oriented to person, place, and time. He appears well-developed and well-nourished. No distress.  Musculoskeletal: He exhibits no edema.  Neurological: He is alert and oriented to person, place, and time.  Psychiatric: He has a normal mood and affect. His behavior is normal. Judgment and thought content normal.          Assessment & Plan:

## 2013-02-09 ENCOUNTER — Other Ambulatory Visit: Payer: Self-pay | Admitting: Internal Medicine

## 2013-02-13 ENCOUNTER — Encounter: Payer: Self-pay | Admitting: Physical Medicine & Rehabilitation

## 2013-02-20 ENCOUNTER — Ambulatory Visit (INDEPENDENT_AMBULATORY_CARE_PROVIDER_SITE_OTHER): Payer: 59 | Admitting: Psychology

## 2013-02-20 DIAGNOSIS — F411 Generalized anxiety disorder: Secondary | ICD-10-CM

## 2013-02-20 DIAGNOSIS — F331 Major depressive disorder, recurrent, moderate: Secondary | ICD-10-CM

## 2013-02-26 ENCOUNTER — Ambulatory Visit: Payer: Self-pay | Admitting: Physical Medicine & Rehabilitation

## 2013-03-06 ENCOUNTER — Ambulatory Visit (INDEPENDENT_AMBULATORY_CARE_PROVIDER_SITE_OTHER): Payer: 59 | Admitting: Psychology

## 2013-03-06 DIAGNOSIS — F331 Major depressive disorder, recurrent, moderate: Secondary | ICD-10-CM

## 2013-03-06 DIAGNOSIS — F411 Generalized anxiety disorder: Secondary | ICD-10-CM

## 2013-03-07 ENCOUNTER — Other Ambulatory Visit: Payer: Self-pay | Admitting: Internal Medicine

## 2013-03-07 ENCOUNTER — Ambulatory Visit: Payer: Managed Care, Other (non HMO) | Admitting: Physical Medicine & Rehabilitation

## 2013-03-07 ENCOUNTER — Encounter: Payer: Managed Care, Other (non HMO) | Attending: Physical Medicine & Rehabilitation

## 2013-03-07 ENCOUNTER — Other Ambulatory Visit: Payer: Self-pay | Admitting: Family

## 2013-03-07 DIAGNOSIS — M961 Postlaminectomy syndrome, not elsewhere classified: Secondary | ICD-10-CM | POA: Insufficient documentation

## 2013-03-07 DIAGNOSIS — G8928 Other chronic postprocedural pain: Secondary | ICD-10-CM | POA: Insufficient documentation

## 2013-03-07 DIAGNOSIS — Z79899 Other long term (current) drug therapy: Secondary | ICD-10-CM | POA: Insufficient documentation

## 2013-03-07 DIAGNOSIS — I2510 Atherosclerotic heart disease of native coronary artery without angina pectoris: Secondary | ICD-10-CM | POA: Insufficient documentation

## 2013-03-07 DIAGNOSIS — E119 Type 2 diabetes mellitus without complications: Secondary | ICD-10-CM | POA: Insufficient documentation

## 2013-03-07 DIAGNOSIS — M79609 Pain in unspecified limb: Secondary | ICD-10-CM | POA: Insufficient documentation

## 2013-03-07 DIAGNOSIS — I10 Essential (primary) hypertension: Secondary | ICD-10-CM | POA: Insufficient documentation

## 2013-03-07 DIAGNOSIS — E785 Hyperlipidemia, unspecified: Secondary | ICD-10-CM | POA: Insufficient documentation

## 2013-03-07 DIAGNOSIS — K219 Gastro-esophageal reflux disease without esophagitis: Secondary | ICD-10-CM | POA: Insufficient documentation

## 2013-03-07 MED ORDER — ALBUTEROL SULFATE HFA 108 (90 BASE) MCG/ACT IN AERS: 2.0000 | INHALATION_SPRAY | Freq: Four times a day (QID) | RESPIRATORY_TRACT | Status: DC | PRN

## 2013-03-07 MED ORDER — ZOLPIDEM TARTRATE 10 MG PO TABS: 10.0000 mg | ORAL_TABLET | Freq: Every evening | ORAL | Status: DC | PRN

## 2013-03-07 MED ORDER — METFORMIN HCL 500 MG PO TABS: 500.0000 mg | ORAL_TABLET | Freq: Every day | ORAL | Status: DC

## 2013-03-07 MED ORDER — ALBUTEROL SULFATE (2.5 MG/3ML) 0.083% IN NEBU: 2.5000 mg | INHALATION_SOLUTION | Freq: Four times a day (QID) | RESPIRATORY_TRACT | Status: DC | PRN

## 2013-03-07 NOTE — Telephone Encounter (Signed)
My Chart Rx Request: Proventil HFA [Last Rx 11.07.2012 Historical Entry on Med List] Metformin 500 mg [Last Rx 11.07.2012 Historical Entry on Med List] Ultracet 37.5-325 mg [Last Rx 03.26.14 #90x0] Ambien 10 mg [Last Rx 04.04.14 #30x0] Ciprofloxacin 500 mg [Last Rx 04.07.14 #6x0] Albuterol Nebulizer 0.083% [Last Rx 05.24.14 #225x0]  Last OV 05.14.14; had appt scheduled with Pain Mgt Erick Colace, MD] [facility] 06.19.14/SLS Please advise.

## 2013-03-07 NOTE — Telephone Encounter (Signed)
Duplicate message/SLS

## 2013-03-18 ENCOUNTER — Ambulatory Visit (HOSPITAL_BASED_OUTPATIENT_CLINIC_OR_DEPARTMENT_OTHER): Payer: Managed Care, Other (non HMO) | Admitting: Physical Medicine & Rehabilitation

## 2013-03-18 ENCOUNTER — Encounter: Payer: Self-pay | Admitting: Physical Medicine & Rehabilitation

## 2013-03-18 VITALS — BP 120/71 | HR 88 | Resp 14 | Ht 68.0 in | Wt 227.8 lb

## 2013-03-18 DIAGNOSIS — G5760 Lesion of plantar nerve, unspecified lower limb: Secondary | ICD-10-CM

## 2013-03-18 DIAGNOSIS — M7660 Achilles tendinitis, unspecified leg: Secondary | ICD-10-CM

## 2013-03-18 DIAGNOSIS — G5762 Lesion of plantar nerve, left lower limb: Secondary | ICD-10-CM | POA: Insufficient documentation

## 2013-03-18 DIAGNOSIS — M961 Postlaminectomy syndrome, not elsewhere classified: Secondary | ICD-10-CM

## 2013-03-18 DIAGNOSIS — M7661 Achilles tendinitis, right leg: Secondary | ICD-10-CM

## 2013-03-18 MED ORDER — GABAPENTIN 300 MG PO CAPS: 300.0000 mg | ORAL_CAPSULE | Freq: Three times a day (TID) | ORAL | Status: DC

## 2013-03-18 NOTE — Patient Instructions (Signed)
Hold aquatic therapy  Aleve 2 tablets twice a day for 7 days  R cast boot see prescription

## 2013-03-18 NOTE — Progress Notes (Signed)
Subjective:    Patient ID: Cody Raymond, male    DOB: 1971-06-06, 42 y.o.   MRN: [id number] Chief complaint right ankle pain, no trauma HPI Work-related injury 2012 now settled  HPI  06/03/2011  Decompressive laminectomy as well as microdiscectomy and foraminotomies right L4-L5. Had preoperative ankle dorsiflexor and toe extensor weakness.  Dr Darrelyn Hillock and [place]  Patient was given permanent restrictions by orthopedics no lifting greater than 5 pounds no stooping or bending  Has gone through PT has had an FCE although I do not have the records for all this.  Had epidural injection didn't help plus Caused elevated blood sugars  Right Achilles pain radiates pain up toward toward back.  PMH :  Diabetes with neuropathy Pain Inventory Average Pain 9 Pain Right Now 8 My pain is sharp, burning, stabbing, tingling and aching  In the last 24 hours, has pain interfered with the following? General activity 7 Relation with others 7 Enjoyment of life 7 What TIME of day is your pain at its worst? morning daytime and evening Sleep (in general) Fair  Pain is worse with: walking, bending, sitting and standing Pain improves with: therapy/exercise and medication Relief from Meds: 1  Mobility use a cane ability to climb steps?  yes do you drive?  yes  Function disabled: date disabled applied I need assistance with the following:  meal prep, household duties and shopping  Neuro/Psych weakness numbness tingling trouble walking spasms depression anxiety  Prior Studies Any changes since last visit?  no  Physicians involved in your care Any changes since last visit?  no   Family History  Problem Relation Age of Onset  . Colon cancer Paternal Grandfather   . Prostate cancer Paternal Grandfather   . Aneurysm Father   . Heart attack Father 68  . Coronary artery disease Father   . Colon cancer Paternal Uncle     x 2  . Colon polyps Mother   . Heart disease Mother   .  Coronary artery disease Mother   . Breast cancer Maternal Grandmother   . Stomach cancer Brother   . Liver disease      unsure who it was   History   Social History  . Marital Status: Married    Spouse Name: N/A    Number of Children: N/A  . Years of Education: N/A   Social History Main Topics  . Smoking status: Never Smoker   . Smokeless tobacco: Never Used  . Alcohol Use: Yes     Comment: rare  . Drug Use: No  . Sexually Active: None   Other Topics Concern  . None   Social History Narrative   Holter monitor 08/2010: PVCs and sinus tachy.   Sleep Study (02/2008): mild sleep apnea, no indication for CPAP.   Past Surgical History  Procedure Laterality Date  . Appendectomy    . Patent ductus arterious repair      at age 58  . Colonoscopy w/ polypectomy  03/17/11    diminutive polyp  . Cardiac catheterization  2007    Clean Cardiac Cath [facility]), with RCA 30% narrowing likely due to catheter induced spasm in 09/02/10.  . Back surgery     Past Medical History  Diagnosis Date  . Diabetes mellitus   . Asthma   . Enlarged prostate   . Hyperlipidemia   . Heart disease   . Hypertension   . Blood transfusion   . GERD (gastroesophageal reflux disease)   . Refusal  of blood transfusions as patient is Jehovah's Witness   . Neuromuscular disorder     DJD  . Coronary artery disease   . CAD (coronary artery disease), non obstructive on cath 2011 04/05/2012   BP 120/71  Pulse 88  Resp 14  Ht 5\' 8"  (1.727 m)  Wt 227 lb 12.8 oz (103.329 kg)  BMI 34.64 kg/m2  SpO2 98%    Review of Systems  Musculoskeletal: Positive for back pain and gait problem.       Spasms  Neurological: Positive for weakness and numbness.       Tingling  Psychiatric/Behavioral: Positive for dysphoric mood. The patient is nervous/anxious.   All other systems reviewed and are negative.       Objective:   Physical Exam  Musculoskeletal:       Right hip: Normal.       Right knee:  Normal.       Right ankle: Achilles tendon exhibits pain. Achilles tendon exhibits no defect and normal Thompson's test results.       Left ankle: Normal.       Lumbar back: He exhibits decreased range of motion.   Tenderness dorsum between third and fourth MTP Nursing note and vitals reviewed.  Constitutional: He appears well-developed and well-nourished.  HENT:  Head: Normocephalic.  Eyes: Conjunctivae and EOM are normal. Pupils are equal, round, and reactive to light.  Musculoskeletal:  Lumbar back: He exhibits decreased range of motion, tenderness and deformity. He exhibits no spasm.  Range of motion Limited to 25% for flexion extension lateral tissue and bending Tenderness palpation bilateral paraspinals L4 L5-S1 level  Neurological: He has normal strength. A sensory deficit is present. Gait normal.  Decreased sensation right L3 L4-L5, S1 dermatome  Negative straight leg raise test  Psychiatric: He has a normal mood and affect.        Assessment & Plan:  1. Lumbar postlaminectomy syndrome with chronic postoperative pain. He has primarily neurogenic pain in the right lower extremity. He gets some partial relief with tramadol but has not tried any medication such as gabapentin or pregabalin.  We'll increase gabapentin to 300 mg 3 times a day and titrate upward if needed.   2. Right Achilles tendinitis, rule out partial tear. Check ultrasound He also has limitation range of motion I think he would benefit from aquatic therapy as he failed to tolerate land-based physical therapy in the past. Doing community-based exercise would hold until Achilles tendinitis is under better control  3. Left Morton's neuroma mild At this point we'll hold off on any type of injections.  Discussed with patient agrees with plan. Do not think narcotic analgesics will be needed therefore have not ordered urine drug screen.

## 2013-03-27 ENCOUNTER — Ambulatory Visit: Payer: 59 | Admitting: Psychology

## 2013-03-27 ENCOUNTER — Other Ambulatory Visit: Payer: Self-pay | Admitting: Internal Medicine

## 2013-03-27 NOTE — Telephone Encounter (Signed)
Rx request to pharmacy/SLS  

## 2013-04-04 ENCOUNTER — Other Ambulatory Visit: Payer: Self-pay | Admitting: Family

## 2013-04-04 NOTE — Telephone Encounter (Signed)
Please advise refill? Last RX was done on 12-12-12 quantity 90 with 0 refills

## 2013-04-25 ENCOUNTER — Encounter: Payer: Managed Care, Other (non HMO) | Attending: Physical Medicine & Rehabilitation

## 2013-04-25 ENCOUNTER — Ambulatory Visit (HOSPITAL_BASED_OUTPATIENT_CLINIC_OR_DEPARTMENT_OTHER): Payer: Managed Care, Other (non HMO) | Admitting: Physical Medicine & Rehabilitation

## 2013-04-25 ENCOUNTER — Encounter: Payer: Self-pay | Admitting: Physical Medicine & Rehabilitation

## 2013-04-25 VITALS — BP 123/62 | HR 84 | Resp 14 | Ht 68.0 in | Wt 221.0 lb

## 2013-04-25 DIAGNOSIS — M6790 Unspecified disorder of synovium and tendon, unspecified site: Secondary | ICD-10-CM

## 2013-04-25 DIAGNOSIS — I2510 Atherosclerotic heart disease of native coronary artery without angina pectoris: Secondary | ICD-10-CM | POA: Insufficient documentation

## 2013-04-25 DIAGNOSIS — K219 Gastro-esophageal reflux disease without esophagitis: Secondary | ICD-10-CM | POA: Insufficient documentation

## 2013-04-25 DIAGNOSIS — M25571 Pain in right ankle and joints of right foot: Secondary | ICD-10-CM

## 2013-04-25 DIAGNOSIS — Z79899 Other long term (current) drug therapy: Secondary | ICD-10-CM | POA: Insufficient documentation

## 2013-04-25 DIAGNOSIS — E119 Type 2 diabetes mellitus without complications: Secondary | ICD-10-CM | POA: Insufficient documentation

## 2013-04-25 DIAGNOSIS — I10 Essential (primary) hypertension: Secondary | ICD-10-CM | POA: Insufficient documentation

## 2013-04-25 DIAGNOSIS — E785 Hyperlipidemia, unspecified: Secondary | ICD-10-CM | POA: Insufficient documentation

## 2013-04-25 DIAGNOSIS — M79609 Pain in unspecified limb: Secondary | ICD-10-CM | POA: Insufficient documentation

## 2013-04-25 DIAGNOSIS — M67971 Unspecified disorder of synovium and tendon, right ankle and foot: Secondary | ICD-10-CM

## 2013-04-25 DIAGNOSIS — G8928 Other chronic postprocedural pain: Secondary | ICD-10-CM | POA: Insufficient documentation

## 2013-04-25 DIAGNOSIS — M719 Bursopathy, unspecified: Secondary | ICD-10-CM

## 2013-04-25 DIAGNOSIS — M25579 Pain in unspecified ankle and joints of unspecified foot: Secondary | ICD-10-CM

## 2013-04-25 DIAGNOSIS — M961 Postlaminectomy syndrome, not elsewhere classified: Secondary | ICD-10-CM | POA: Insufficient documentation

## 2013-04-25 MED ORDER — GABAPENTIN 300 MG PO CAPS: 300.0000 mg | ORAL_CAPSULE | Freq: Four times a day (QID) | ORAL | Status: DC

## 2013-04-25 MED ORDER — TRAMADOL-ACETAMINOPHEN 37.5-325 MG PO TABS: 1.0000 | ORAL_TABLET | Freq: Three times a day (TID) | ORAL | Status: DC | PRN

## 2013-04-25 NOTE — Progress Notes (Signed)
Limited ankle ultrasound right side Indication is Achilles tendon pain which has persisted despite physical therapy and medications and splinting  Patient placed in a prone position 12 Hz linear transducer utilized  Long axis views of the right Achilles tendon were obtained starting at the calcaneal insertion. There is evidence of cortical irregularity. Tendon tightness measured at 0.42-0.49 cm which is below the lower limit of normal at 0 .55cm There is no evidence of tendon tear along the length of the tendon scanning the distal 10 cm No evidence of peri-tendinous fluid Short axis views show no evidence of irregularity or increased bursa fluid  Images were stored.  Impression: #1. Abnormal study #2. 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 No evidence of excessive retro-calcaneal bursa fluid.

## 2013-04-25 NOTE — Patient Instructions (Addendum)
There is no evidence of a Achilles tendon tear There is evidence of chronic inflammation at the attachment of the Achilles tendon to the heel We will increase the tramadol to 3 times per day take the third dose as soon as you get up      Achilles Tendinitis  with Rehab Achilles tendinitis is a disorder of the Achilles tendon. The Achilles tendon connects the large calf muscles (Gastrocnemius and Soleus) to the heel bone (calcaneus). This tendon is sometimes called the heel cord. It is important for pushing-off and standing on your toes and is important for walking, running, or jumping. Tendinitis is often caused by overuse and repetitive microtrauma. SYMPTOMS  Pain, tenderness, swelling, warmth, and redness may occur over the Achilles tendon even at rest.  Pain with pushing off, or flexing or extending the ankle.  Pain that is worsened after or during activity. CAUSES   Overuse sometimes seen with rapid increase in exercise programs or in sports requiring running and jumping.  Poor physical conditioning (strength and flexibility or endurance).  Running sports, especially training running down hills.  Inadequate warm-up before practice or play or failure to stretch before participation.  Injury to the tendon. PREVENTION   Warm up and stretch before practice or competition.  Allow time for adequate rest and recovery between practices and competition.  Keep up conditioning.  Keep up ankle and leg flexibility.  Improve or keep muscle strength and endurance.  Improve cardiovascular fitness.  Use proper technique.  Use proper equipment (shoes, skates).  To help prevent recurrence, taping, protective strapping, or an adhesive bandage may be recommended for several weeks after healing is complete. PROGNOSIS   Recovery may take weeks to several months to heal.  Longer recovery is expected if symptoms have been prolonged.  Recovery is usually quicker if the inflammation is due  to a direct blow as compared with overuse or sudden strain. RELATED COMPLICATIONS   Healing time will be prolonged if the condition is not correctly treated. The injury must be given plenty of time to heal.  Symptoms can reoccur if activity is resumed too soon.  Untreated, tendinitis may increase the risk of tendon rupture requiring additional time for recovery and possibly surgery. TREATMENT   The first treatment consists of rest anti-inflammatory medication, and ice to relieve the pain.  Stretching and strengthening exercises after resolution of pain will likely help reduce the risk of recurrence. Referral to a physical therapist or athletic trainer for further evaluation and treatment may be helpful.  A walking boot or cast may be recommended to rest the Achilles tendon. This can help break the cycle of inflammation and microtrauma.  Arch supports (orthotics) may be prescribed or recommended by your caregiver as an adjunct to therapy and rest.  Surgery to remove the inflamed tendon lining or degenerated tendon tissue is rarely necessary and has shown less than predictable results. MEDICATION   Nonsteroidal anti-inflammatory medications, such as aspirin and ibuprofen, may be used for pain and inflammation relief. Do not take within 7 days before surgery. Take these as directed by your caregiver. Contact your caregiver immediately if any bleeding, stomach upset, or signs of allergic reaction occur. Other minor pain relievers, such as acetaminophen, may also be used.  Pain relievers may be prescribed as necessary by your caregiver. Do not take prescription pain medication for longer than 4 to 7 days. Use only as directed and only as much as you need.  Cortisone injections are rarely indicated. Cortisone injections  may weaken tendons and predispose to rupture. It is better to give the condition more time to heal than to use them. HEAT AND COLD  Cold is used to relieve pain and reduce  inflammation for acute and chronic Achilles tendinitis. Cold should be applied for 10 to 15 minutes every 2 to 3 hours for inflammation and pain and immediately after any activity that aggravates your symptoms. Use ice packs or an ice massage.  Heat may be used before performing stretching and strengthening activities prescribed by your caregiver. Use a heat pack or a warm soak. SEEK MEDICAL CARE IF:  Symptoms get worse or do not improve in 2 weeks despite treatment.  New, unexplained symptoms develop. Drugs used in treatment may produce side effects. EXERCISES RANGE OF MOTION (ROM) AND STRETCHING EXERCISES - Achilles Tendinitis  These exercises may help you when beginning to rehabilitate your injury. Your symptoms may resolve with or without further involvement from your physician, physical therapist or athletic trainer. While completing these exercises, remember:   Restoring tissue flexibility helps normal motion to return to the joints. This allows healthier, less painful movement and activity.  An effective stretch should be held for at least 30 seconds.  A stretch should never be painful. You should only feel a gentle lengthening or release in the stretched tissue. STRETCH  Gastroc, Standing   Place hands on wall.  Extend right / left leg, keeping the front knee somewhat bent.  Slightly point your toes inward on your back foot.  Keeping your right / left heel on the floor and your knee straight, shift your weight toward the wall, not allowing your back to arch.  You should feel a gentle stretch in the right / left calf. Hold this position for __________ seconds. Repeat __________ times. Complete this stretch __________ times per day. STRETCH  Soleus, Standing   Place hands on wall.  Extend right / left leg, keeping the other knee somewhat bent.  Slightly point your toes inward on your back foot.  Keep your right / left heel on the floor, bend your back knee, and slightly  shift your weight over the back leg so that you feel a gentle stretch deep in your back calf.  Hold this position for __________ seconds. Repeat __________ times. Complete this stretch __________ times per day. STRETCH  Gastrocsoleus, Standing  Note: This exercise can place a lot of stress on your foot and ankle. Please complete this exercise only if specifically instructed by your caregiver.   Place the ball of your right / left foot on a step, keeping your other foot firmly on the same step.  Hold on to the wall or a rail for balance.  Slowly lift your other foot, allowing your body weight to press your heel down over the edge of the step.  You should feel a stretch in your right / left calf.  Hold this position for __________ seconds.  Repeat this exercise with a slight bend in your knee. Repeat __________ times. Complete this stretch __________ times per day.  STRENGTHENING EXERCISES - Achilles Tendinitis These exercises may help you when beginning to rehabilitate your injury. They may resolve your symptoms with or without further involvement from your physician, physical therapist or athletic trainer. While completing these exercises, remember:   Muscles can gain both the endurance and the strength needed for everyday activities through controlled exercises.  Complete these exercises as instructed by your physician, physical therapist or athletic trainer. Progress the resistance and  repetitions only as guided.  You may experience muscle soreness or fatigue, but the pain or discomfort you are trying to eliminate should never worsen during these exercises. If this pain does worsen, stop and make certain you are following the directions exactly. If the pain is still present after adjustments, discontinue the exercise until you can discuss the trouble with your clinician. STRENGTH - Plantar-flexors   Sit with your right / left leg extended. Holding onto both ends of a rubber exercise  band/tubing, loop it around the ball of your foot. Keep a slight tension in the band.  Slowly push your toes away from you, pointing them downward.  Hold this position for __________ seconds. Return slowly, controlling the tension in the band/tubing. Repeat __________ times. Complete this exercise __________ times per day.  STRENGTH - Plantar-flexors   Stand with your feet shoulder width apart. Steady yourself with a wall or table using as little support as needed.  Keeping your weight evenly spread over the width of your feet, rise up on your toes.*  Hold this position for __________ seconds. Repeat __________ times. Complete this exercise __________ times per day.  *If this is too easy, shift your weight toward your right / left leg until you feel challenged. Ultimately, you may be asked to do this exercise with your right / left foot only. STRENGTH  Plantar-flexors, Eccentric  Note: This exercise can place a lot of stress on your foot and ankle. Please complete this exercise only if specifically instructed by your caregiver.   Place the balls of your feet on a step. With your hands, use only enough support from a wall or rail to keep your balance.  Keep your knees straight and rise up on your toes.  Slowly shift your weight entirely to your right / left toes and pick up your opposite foot. Gently and with controlled movement, lower your weight through your right / left foot so that your heel drops below the level of the step. You will feel a slight stretch in the back of your calf at the end position.  Use the healthy leg to help rise up onto the balls of both feet, then lower weight only on the right / left leg again. Build up to 15 repetitions. Then progress to 3 consecutive sets of 15 repetitions.*  After completing the above exercise, complete the same exercise with a slight knee bend (about 30 degrees). Again, build up to 15 repetitions. Then progress to 3 consecutive sets of 15  repetitions.* Perform this exercise __________ times per day.  *When you easily complete 3 sets of 15, your physician, physical therapist or athletic trainer may advise you to add resistance by wearing a backpack filled with additional weight. STRENGTH - Plantar Flexors, Seated   Sit on a chair that allows your feet to rest flat on the ground. If necessary, sit at the edge of the chair.  Keeping your toes firmly on the ground, lift your right / left heel as far as you can without increasing any discomfort in your ankle. Repeat __________ times. Complete this exercise __________ times a day. *If instructed by your physician, physical therapist or athletic trainer, you may add ____________________ of resistance by placing a weighted object on your right / left knee. Document Released: 04/06/2005 Document Revised: 11/28/2011 Document Reviewed: 12/18/2008 [facility] Patient Information 2014 [place], [place].

## 2013-06-13 ENCOUNTER — Other Ambulatory Visit: Payer: Self-pay

## 2013-06-13 MED ORDER — TRAMADOL-ACETAMINOPHEN 37.5-325 MG PO TABS: 1.0000 | ORAL_TABLET | Freq: Three times a day (TID) | ORAL | Status: DC | PRN

## 2013-07-07 ENCOUNTER — Other Ambulatory Visit: Payer: Self-pay | Admitting: Family

## 2013-07-17 ENCOUNTER — Ambulatory Visit: Payer: Self-pay | Admitting: Family

## 2013-07-17 ENCOUNTER — Telehealth: Payer: Self-pay | Admitting: Family

## 2013-07-17 NOTE — Telephone Encounter (Signed)
Spoke to [employer]. At [facility] pharmacy and informed her that our office policy requires that they send the patient an order for Diabetic testing supplies and then patient will need to schedule an appointment with [place]. If the patient is requesting this.

## 2013-07-21 ENCOUNTER — Encounter: Payer: Self-pay | Admitting: Family

## 2013-07-22 ENCOUNTER — Ambulatory Visit: Payer: Self-pay | Admitting: Family

## 2013-07-22 NOTE — Telephone Encounter (Signed)
Could you please follow up on this -

## 2013-07-25 ENCOUNTER — Other Ambulatory Visit: Payer: Self-pay

## 2013-07-26 ENCOUNTER — Ambulatory Visit (HOSPITAL_BASED_OUTPATIENT_CLINIC_OR_DEPARTMENT_OTHER): Payer: Managed Care, Other (non HMO) | Admitting: Physical Medicine & Rehabilitation

## 2013-07-26 ENCOUNTER — Encounter: Payer: Self-pay | Admitting: Physical Medicine & Rehabilitation

## 2013-07-26 ENCOUNTER — Encounter: Payer: Managed Care, Other (non HMO) | Attending: Physical Medicine & Rehabilitation

## 2013-07-26 VITALS — BP 121/71 | HR 90 | Resp 14 | Ht 68.0 in | Wt 222.0 lb

## 2013-07-26 DIAGNOSIS — E119 Type 2 diabetes mellitus without complications: Secondary | ICD-10-CM | POA: Insufficient documentation

## 2013-07-26 DIAGNOSIS — M549 Dorsalgia, unspecified: Secondary | ICD-10-CM

## 2013-07-26 DIAGNOSIS — I10 Essential (primary) hypertension: Secondary | ICD-10-CM | POA: Insufficient documentation

## 2013-07-26 DIAGNOSIS — M7660 Achilles tendinitis, unspecified leg: Secondary | ICD-10-CM

## 2013-07-26 DIAGNOSIS — M5416 Radiculopathy, lumbar region: Secondary | ICD-10-CM | POA: Insufficient documentation

## 2013-07-26 DIAGNOSIS — I2510 Atherosclerotic heart disease of native coronary artery without angina pectoris: Secondary | ICD-10-CM | POA: Insufficient documentation

## 2013-07-26 DIAGNOSIS — E785 Hyperlipidemia, unspecified: Secondary | ICD-10-CM | POA: Insufficient documentation

## 2013-07-26 DIAGNOSIS — M79609 Pain in unspecified limb: Secondary | ICD-10-CM | POA: Insufficient documentation

## 2013-07-26 DIAGNOSIS — M961 Postlaminectomy syndrome, not elsewhere classified: Secondary | ICD-10-CM | POA: Insufficient documentation

## 2013-07-26 DIAGNOSIS — K219 Gastro-esophageal reflux disease without esophagitis: Secondary | ICD-10-CM | POA: Insufficient documentation

## 2013-07-26 DIAGNOSIS — G8929 Other chronic pain: Secondary | ICD-10-CM

## 2013-07-26 DIAGNOSIS — Z79899 Other long term (current) drug therapy: Secondary | ICD-10-CM | POA: Insufficient documentation

## 2013-07-26 DIAGNOSIS — IMO0002 Reserved for concepts with insufficient information to code with codable children: Secondary | ICD-10-CM

## 2013-07-26 DIAGNOSIS — M7661 Achilles tendinitis, right leg: Secondary | ICD-10-CM

## 2013-07-26 DIAGNOSIS — G8928 Other chronic postprocedural pain: Secondary | ICD-10-CM | POA: Insufficient documentation

## 2013-07-26 MED ORDER — GABAPENTIN 300 MG PO CAPS: 300.0000 mg | ORAL_CAPSULE | Freq: Three times a day (TID) | ORAL | Status: DC

## 2013-07-26 NOTE — Progress Notes (Signed)
Subjective:    Patient ID: Cody Raymond, male    DOB: 20-Jun-1971, 42 y.o.   MRN: [id number]  HPI Ankle pain is not severe. Used an ankle brace for about 6 weeks. Also has back pain as well as shooting pains down the right leg. He gets some partial relief with tramadol for the back pain. He also gets partially up with gabapentin for the shooting pain down his leg. 300 mg works better than 100 although it does cause some drowsiness. No falls. Uses cane to ambulate outside the home. Pain Inventory Average Pain 8 Pain Right Now 8 My pain is sharp, stabbing, tingling and aching  In the last 24 hours, has pain interfered with the following? General activity 6 Relation with others 6 Enjoyment of life 6 What TIME of day is your pain at its worst? all the time Sleep (in general) Fair  Pain is worse with: bending, sitting, standing and some activites Pain improves with: rest Relief from Meds: 5  Mobility use a cane ability to climb steps?  no do you drive?  yes  Function disabled: date disabled . I need assistance with the following:  bathing  Neuro/Psych weakness numbness tingling spasms depression anxiety  Prior Studies Any changes since last visit?  no  Physicians involved in your care Any changes since last visit?  no   Family History  Problem Relation Age of Onset  . Colon cancer Paternal Grandfather   . Prostate cancer Paternal Grandfather   . Aneurysm Father   . Heart attack Father 32  . Coronary artery disease Father   . Colon cancer Paternal Uncle     x 2  . Colon polyps Mother   . Heart disease Mother   . Coronary artery disease Mother   . Breast cancer Maternal Grandmother   . Stomach cancer Brother   . Liver disease      unsure who it was   History   Social History  . Marital Status: Married    Spouse Name: N/A    Number of Children: N/A  . Years of Education: N/A   Social History Main Topics  . Smoking status: Never Smoker   .  Smokeless tobacco: Never Used  . Alcohol Use: Yes     Comment: rare  . Drug Use: No  . Sexual Activity: None   Other Topics Concern  . None   Social History Narrative   Holter monitor 08/2010: PVCs and sinus tachy.   Sleep Study (02/2008): mild sleep apnea, no indication for CPAP.   Past Surgical History  Procedure Laterality Date  . Appendectomy    . Patent ductus arterious repair      at age 84  . Colonoscopy w/ polypectomy  03/17/11    diminutive polyp  . Cardiac catheterization  2007    Clean Cardiac Cath [facility]), with RCA 30% narrowing likely due to catheter induced spasm in 09/02/10.  . Back surgery     Past Medical History  Diagnosis Date  . Diabetes mellitus   . Asthma   . Enlarged prostate   . Hyperlipidemia   . Heart disease   . Hypertension   . Blood transfusion   . GERD (gastroesophageal reflux disease)   . Refusal of blood transfusions as patient is Jehovah's Witness   . Neuromuscular disorder     DJD  . Coronary artery disease   . CAD (coronary artery disease), non obstructive on cath 2011 04/05/2012   BP 121/71  Pulse  90  Resp 14  Ht 5\' 8"  (1.727 m)  Wt 222 lb (100.699 kg)  BMI 33.76 kg/m2  SpO2 96%     Review of Systems  Respiratory: Positive for cough, shortness of breath and wheezing.   Gastrointestinal: Positive for abdominal pain.  Neurological: Positive for weakness and numbness.  Psychiatric/Behavioral: Positive for dysphoric mood. The patient is nervous/anxious.   All other systems reviewed and are negative.       Objective:   Physical Exam  Nursing note and vitals reviewed. Constitutional: He is oriented to person, place, and time. He appears well-developed and well-nourished.  HENT:  Head: Normocephalic and atraumatic.  Eyes: Conjunctivae and EOM are normal. Pupils are equal, round, and reactive to light.  Musculoskeletal:       Right ankle: Achilles tendon exhibits pain. Achilles tendon exhibits no defect and  normal Thompson's test results.       Lumbar back: He exhibits decreased range of motion and deformity. He exhibits no tenderness.  Decreased lumbar extension 0-25% decreased flexion 0 25% decreased lateral bending 0-25%. Extension is the most painful  Negative straight leg raising test  Neurological: He is alert and oriented to person, place, and time. He has normal reflexes.  Psychiatric: He has a normal mood and affect.          Assessment & Plan:  1. Chronic lumbar radiculitis L4-L5 levels. He's had some relief with epidural the past but unfortunately elevated his blood sugars. We discussed that selective nerve root block can be done without steroids and can give relief. He would like to pursue this option. 2. Lumbar spondylosis noted on CT the abdomen and pelvis April 2014. May benefit from medial branch blocks however he states that the shooting pain down the leg is his major issue 3. Achilles tendinitis overall improved still has some mild tenderness. We discussed Cam Walker boot he does not want to pursue this option however.

## 2013-07-26 NOTE — Patient Instructions (Signed)
Lidocaine only injection next visit selective nerve root block

## 2013-07-29 ENCOUNTER — Ambulatory Visit (INDEPENDENT_AMBULATORY_CARE_PROVIDER_SITE_OTHER): Payer: Managed Care, Other (non HMO) | Admitting: Family

## 2013-07-29 ENCOUNTER — Encounter: Payer: Self-pay | Admitting: Family

## 2013-07-29 VITALS — BP 116/78 | HR 85 | Temp 98.0°F | Resp 16 | Ht 68.0 in | Wt 223.0 lb

## 2013-07-29 DIAGNOSIS — E785 Hyperlipidemia, unspecified: Secondary | ICD-10-CM

## 2013-07-29 DIAGNOSIS — E119 Type 2 diabetes mellitus without complications: Secondary | ICD-10-CM

## 2013-07-29 DIAGNOSIS — E781 Pure hyperglyceridemia: Secondary | ICD-10-CM

## 2013-07-29 DIAGNOSIS — Z23 Encounter for immunization: Secondary | ICD-10-CM

## 2013-07-29 DIAGNOSIS — J45909 Unspecified asthma, uncomplicated: Secondary | ICD-10-CM

## 2013-07-29 DIAGNOSIS — F329 Major depressive disorder, single episode, unspecified: Secondary | ICD-10-CM

## 2013-07-29 DIAGNOSIS — R369 Urethral discharge, unspecified: Secondary | ICD-10-CM

## 2013-07-29 MED ORDER — ROSUVASTATIN CALCIUM 40 MG PO TABS: 40.0000 mg | ORAL_TABLET | Freq: Every day | ORAL | Status: DC

## 2013-07-29 MED ORDER — FLUTICASONE PROPIONATE HFA 220 MCG/ACT IN AERO: 1.0000 | INHALATION_SPRAY | Freq: Two times a day (BID) | RESPIRATORY_TRACT | Status: DC

## 2013-07-29 MED ORDER — METFORMIN HCL 500 MG PO TABS: 500.0000 mg | ORAL_TABLET | Freq: Two times a day (BID) | ORAL | Status: DC

## 2013-07-29 MED ORDER — SILDENAFIL CITRATE 50 MG PO TABS: 50.0000 mg | ORAL_TABLET | Freq: Every day | ORAL | Status: DC | PRN

## 2013-07-29 NOTE — Assessment & Plan Note (Signed)
Stable on current meds.  Defer management to psychiatry.

## 2013-07-29 NOTE — Assessment & Plan Note (Signed)
Deteriorated.  Will add flovent.  Continue prn albuterol. Follow up if symptoms worsen or if symptoms do not improve.

## 2013-07-29 NOTE — Assessment & Plan Note (Signed)
Start Viagra.  Education provided regarding not to use nitroglycerin and viagra together. Patient verbalizes understanding. Follow up as needed.

## 2013-07-29 NOTE — Assessment & Plan Note (Addendum)
Continue Crestor  Obtain LFT's, refill Crestor, education provided on importance of fresh fruits and vegetables and to limit fried, fatty foods. Follow up in 3 months.

## 2013-07-29 NOTE — Assessment & Plan Note (Signed)
Continue Metformin 500mg  BID.  Obtain HgA1C, microalbumin, BMET, and diabetic foot exam.  Follow up in 3 months.

## 2013-07-29 NOTE — Progress Notes (Signed)
Subjective:    Patient ID: Cody Raymond, male    DOB: 1971-06-03, 42 y.o.   MRN: [id number]  HPI [name] is a 42 year old male who presents today for follow up.  1) Hyperlipidemia- Patient taking crestor, needs refill.  Patient reports small amount of fresh fruits and vegetables. Reports decrease in fast food.  2) Diabetes- Patient checks blood sugar in the AM before breakfast, after lunch and after dinner.  Patient reports sugars are running 110 and 100's.  Taking metformin twice daily, reports mild diarrhea/abdominal discomfort.  3) Depression- Patient seeing Dr. Gwyndolyn Raymond with psychiatry.  Continues Zoloft and Neurontin.  Patient denies SI and HI.   4) ED- patient reports he is able to have an erection but unable to hold it for a long time. Patient attributes this to when he started taking the Zoloft and Neurontin.  Patient denies pain, reports intermittent penile discharge without swelling. He reports that he is no longer using nitroglycerine.    5) Asthma- reports recent worsening of his asthma symptoms. Report that advair was not affordable.   Review of Systems  Constitutional: Negative for fever.  HENT: Negative for sore throat.   Respiratory: Negative for cough and shortness of breath.   Cardiovascular: Negative for chest pain.  Gastrointestinal: Negative for nausea and vomiting.  Genitourinary: Negative for dysuria and penile pain.   Past Medical History  Diagnosis Date  . Diabetes mellitus   . Asthma   . Enlarged prostate   . Hyperlipidemia   . Heart disease   . Hypertension   . Blood transfusion   . GERD (gastroesophageal reflux disease)   . Refusal of blood transfusions as patient is Jehovah's Witness   . Neuromuscular disorder     DJD  . Coronary artery disease   . CAD (coronary artery disease), non obstructive on cath 2011 04/05/2012    History   Social History  . Marital Status: Married    Spouse Name: N/A    Number of Children: N/A  . Years of  Education: N/A   Occupational History  . Not on file.   Social History Main Topics  . Smoking status: Never Smoker   . Smokeless tobacco: Never Used  . Alcohol Use: Yes     Comment: rare  . Drug Use: No  . Sexual Activity: Not on file   Other Topics Concern  . Not on file   Social History Narrative   Holter monitor 08/2010: PVCs and sinus tachy.   Sleep Study (02/2008): mild sleep apnea, no indication for CPAP.    Past Surgical History  Procedure Laterality Date  . Appendectomy    . Patent ductus arterious repair      at age 32  . Colonoscopy w/ polypectomy  03/17/11    diminutive polyp  . Cardiac catheterization  2007    Clean Cardiac Cath [facility] Cards), with RCA 30% narrowing likely due to catheter induced spasm in 09/02/10.  . Back surgery      Family History  Problem Relation Age of Onset  . Colon cancer Paternal Grandfather   . Prostate cancer Paternal Grandfather   . Aneurysm Father   . Heart attack Father 48  . Coronary artery disease Father   . Colon cancer Paternal Uncle     x 2  . Colon polyps Mother   . Heart disease Mother   . Coronary artery disease Mother   . Breast cancer Maternal Grandmother   . Stomach cancer Brother   .  Liver disease      unsure who it was    Allergies  Allergen Reactions  . Benadryl [Diphenhydramine Hcl]     "drives me nuts"  . Red Blood Cells     PT. REFUSES ANY BLOOD PRODUCTS - JEHOVAH'S WITNESS.    Current Outpatient Prescriptions on File Prior to Visit  Medication Sig Dispense Refill  . albuterol (PROVENTIL HFA;VENTOLIN HFA) 108 (90 BASE) MCG/ACT inhaler Inhale 2 puffs into the lungs every 6 (six) hours as needed. Use with spacer, for asthma  1 Inhaler  5  . albuterol (PROVENTIL) (2.5 MG/3ML) 0.083% nebulizer solution Take 3 mLs (2.5 mg total) by nebulization every 6 (six) hours as needed. For wheezing  75 mL  5  . aspirin 325 MG tablet Take 325 mg by mouth daily.      . betamethasone dipropionate (DIPROLENE)  0.05 % cream Apply 1 application topically every evening.      . cetirizine (ZYRTEC) 10 MG tablet Take 10 mg by mouth daily.      . fish oil-omega-3 fatty acids 1000 MG capsule Take 1 g by mouth daily.       . Fluticasone-Salmeterol (ADVAIR DISKUS) 250-50 MCG/DOSE AEPB Inhale 1 puff into the lungs 2 (two) times daily.  1 each  3  . gabapentin (NEURONTIN) 300 MG capsule Take 1 capsule (300 mg total) by mouth 3 (three) times daily.  90 capsule  2  . ketoconazole (NIZORAL) 2 % shampoo       . lansoprazole (PREVACID) 15 MG capsule Take 15 mg by mouth daily.      Marland Kitchen lisinopril (PRINIVIL,ZESTRIL) 2.5 MG tablet TAKE 1 TABLET BY MOUTH DAILY  90 tablet  0  . metoprolol tartrate (LOPRESSOR) 25 MG tablet Take 12.5 mg by mouth 2 (two) times daily.      . ramelteon (ROZEREM) 8 MG tablet Take 1 tablet (8 mg total) by mouth at bedtime.  30 tablet  3  . sertraline (ZOLOFT) 50 MG tablet TAKE 1 TABLET (50 MG TOTAL) BY MOUTH DAILY.  90 tablet  1  . tamsulosin (FLOMAX) 0.4 MG CAPS Take 1 capsule (0.4 mg total) by mouth daily.  30 capsule  3  . traMADol-acetaminophen (ULTRACET) 37.5-325 MG per tablet Take 1 tablet by mouth every 8 (eight) hours as needed for pain.  90 tablet  2  . traZODone (DESYREL) 100 MG tablet       . zolpidem (AMBIEN) 10 MG tablet Take 1 tablet (10 mg total) by mouth at bedtime as needed for sleep.  30 tablet  0  . nitroGLYCERIN (NITROSTAT) 0.4 MG SL tablet Place 0.4 mg under the tongue every 5 (five) minutes as needed. For chest pain      . [DISCONTINUED] niacin (NIASPAN) 1000 MG CR tablet Take 1 tablet (1,000 mg total) by mouth at bedtime.  30 tablet  6   No current facility-administered medications on file prior to visit.    BP 116/78  Pulse 85  Temp(Src) 98 F (36.7 C) (Oral)  Resp 16  Ht 5\' 8"  (1.727 m)  Wt 223 lb (101.152 kg)  BMI 33.91 kg/m2  SpO2 98%       Objective:   Physical Exam  Constitutional: He is oriented to person, place, and time. He appears well-nourished.   Cardiovascular: Normal rate and regular rhythm.   No murmur heard. Pulmonary/Chest: He has wheezes.  Mild right upper and lower expiratory wheezing  Abdominal: Soft. Bowel sounds are normal. He exhibits no distension.  There is tenderness.  Generalized tenderness, no rebound or guarding  Neurological: He is alert and oriented to person, place, and time.  Skin: Skin is warm and dry.  Psychiatric: He has a normal mood and affect.          Assessment & Plan:

## 2013-07-29 NOTE — Patient Instructions (Signed)
Please complete lab work prior to leaving. Call if asthma symptoms worsen or if not improved in 1 week. Follow up in 3 months.

## 2013-07-30 ENCOUNTER — Telehealth: Payer: Self-pay | Admitting: Family

## 2013-07-30 LAB — BASIC METABOLIC PANEL WITH GFR
CO2: 24 mEq/L (ref 19–32)
Glucose, Bld: 115 mg/dL — ABNORMAL HIGH (ref 70–99)
Potassium: 4.4 mEq/L (ref 3.5–5.3)
Sodium: 135 mEq/L (ref 135–145)

## 2013-07-30 LAB — LIPID PANEL
HDL: 37 mg/dL — ABNORMAL LOW (ref >39)
Triglycerides: 449 mg/dL — ABNORMAL HIGH (ref <150)

## 2013-07-30 LAB — HEPATIC FUNCTION PANEL
AST: 30 U/L (ref 0–37)
Alkaline Phosphatase: 109 U/L (ref 39–117)
Bilirubin, Direct: 0.1 mg/dL (ref 0.0–0.3)
Indirect Bilirubin: 0.6 mg/dL (ref 0.0–0.9)
Total Bilirubin: 0.7 mg/dL (ref 0.3–1.2)
Total Protein: 7.9 g/dL (ref 6.0–8.3)

## 2013-07-30 LAB — GC/CHLAMYDIA PROBE AMP, URINE: Chlamydia, Swab/Urine, PCR: NEGATIVE

## 2013-07-30 MED ORDER — FENOFIBRATE 145 MG PO TABS: 145.0000 mg | ORAL_TABLET | Freq: Every day | ORAL | Status: DC

## 2013-07-30 MED ORDER — SULFAMETHOXAZOLE-TMP DS 800-160 MG PO TABS: 1.0000 | ORAL_TABLET | Freq: Two times a day (BID) | ORAL | Status: DC

## 2013-07-30 NOTE — Telephone Encounter (Signed)
Please let pt know that his urine culture shows mild UTI.  I have sent abx to his pharmacy. Sugar remains controlled.  Triglycerides are still high.  I would like him to add fenofibrate once daily to help with his triglycerides.

## 2013-07-31 MED ORDER — CIPROFLOXACIN HCL 250 MG PO TABS: 250.0000 mg | ORAL_TABLET | Freq: Two times a day (BID) | ORAL | Status: DC

## 2013-07-31 MED ORDER — ATORVASTATIN CALCIUM 80 MG PO TABS: 80.0000 mg | ORAL_TABLET | Freq: Every day | ORAL | Status: DC

## 2013-07-31 MED ORDER — BUDESONIDE 180 MCG/ACT IN AEPB: 2.0000 | INHALATION_SPRAY | Freq: Two times a day (BID) | RESPIRATORY_TRACT | Status: DC

## 2013-07-31 MED ORDER — CIPROFLOXACIN HCL 500 MG PO TABS: 500.0000 mg | ORAL_TABLET | Freq: Two times a day (BID) | ORAL | Status: DC

## 2013-07-31 NOTE — Telephone Encounter (Signed)
Notified pt of results below. Rx sent for Cipro. Pt states his insurance will not cover crestor or flovent and he would like cheaper alternative?  Also states this is the 2nd occurrence of UTI and pt is wondering if this could be coming from his prostate.  Denies testicle pain or swelling but notes lower abdominal pain, burning with urination and pain above testicles. Advised pt to complete antibiotic as recommended.  Please advise.

## 2013-07-31 NOTE — Telephone Encounter (Signed)
Received note from [employer] RE:  Drug interaction between bactrim and lisinopril.  D/c bactrim. Send cipro 250mg  BID x 5 days #10 please.

## 2013-07-31 NOTE — Telephone Encounter (Signed)
I sent atorvastatin in place of crestor, and pulmicort in place of flovent.  In regards to concern re: prostate, it is certainly possible that he could have underlying prostate infection causing his symptoms.  I recommend that instead of the 250 mg of cipro that we initially planned, that he take 500mg  bid for 4 weeks.  He can use the 250 mg tabs he has for now until he picks up the 500's. Prostate infections take a lot longer to treat.  (FYI I did leave message on voicemail at [facility] to cancel the 250's if he did not yet pick up).

## 2013-08-02 NOTE — Telephone Encounter (Signed)
Notified pt and he voices understanding. Did not pick up 250mg  rx and states he will pick up 500mg  rx today.  He states Pulmicort is covered but at a $45 copay and he is unable to afford this. Is there any other alternative?

## 2013-08-27 ENCOUNTER — Encounter: Payer: Self-pay | Admitting: Physical Medicine & Rehabilitation

## 2013-08-27 ENCOUNTER — Ambulatory Visit (HOSPITAL_BASED_OUTPATIENT_CLINIC_OR_DEPARTMENT_OTHER): Payer: Managed Care, Other (non HMO) | Admitting: Physical Medicine & Rehabilitation

## 2013-08-27 ENCOUNTER — Encounter: Payer: Managed Care, Other (non HMO) | Attending: Physical Medicine & Rehabilitation

## 2013-08-27 VITALS — BP 112/70 | HR 99 | Resp 14 | Ht 68.0 in | Wt 225.0 lb

## 2013-08-27 DIAGNOSIS — IMO0002 Reserved for concepts with insufficient information to code with codable children: Secondary | ICD-10-CM

## 2013-08-27 DIAGNOSIS — M79609 Pain in unspecified limb: Secondary | ICD-10-CM | POA: Insufficient documentation

## 2013-08-27 DIAGNOSIS — K219 Gastro-esophageal reflux disease without esophagitis: Secondary | ICD-10-CM | POA: Insufficient documentation

## 2013-08-27 DIAGNOSIS — I2510 Atherosclerotic heart disease of native coronary artery without angina pectoris: Secondary | ICD-10-CM | POA: Insufficient documentation

## 2013-08-27 DIAGNOSIS — E119 Type 2 diabetes mellitus without complications: Secondary | ICD-10-CM | POA: Insufficient documentation

## 2013-08-27 DIAGNOSIS — Z79899 Other long term (current) drug therapy: Secondary | ICD-10-CM | POA: Insufficient documentation

## 2013-08-27 DIAGNOSIS — I10 Essential (primary) hypertension: Secondary | ICD-10-CM | POA: Insufficient documentation

## 2013-08-27 DIAGNOSIS — M5416 Radiculopathy, lumbar region: Secondary | ICD-10-CM

## 2013-08-27 DIAGNOSIS — E785 Hyperlipidemia, unspecified: Secondary | ICD-10-CM | POA: Insufficient documentation

## 2013-08-27 DIAGNOSIS — G8928 Other chronic postprocedural pain: Secondary | ICD-10-CM | POA: Insufficient documentation

## 2013-08-27 DIAGNOSIS — M961 Postlaminectomy syndrome, not elsewhere classified: Secondary | ICD-10-CM | POA: Insufficient documentation

## 2013-08-27 NOTE — Patient Instructions (Addendum)
You received an epidural s injection under fluoroscopic guidance. This is the most accurate way to perform an epidural injection. This injection was performed to relieve thigh or leg or foot pain that may be related to a pinched nerve in the lumbar spine. The local anesthetic injected today may cause numbness in your leg for a couple hours. If it is severe we may need to observe you for 30-60 minutes after the injection.   This injection may last for days weeks or months. It can be repeated if needed. If it is not effective, another spinal level may need to be injected. Other treatments include medication management as well as physical therapy. In some cases surgery may be an option.

## 2013-08-27 NOTE — Progress Notes (Signed)
  PROCEDURE RECORD [facility]   Name: Chandon Lazcano DOB:[birth year] MRN: [id number]  Date:08/27/2013  Physician: Claudette Laws, MD    Nurse/CMA: Lashae Wollenberg,CMA/[place],CMA  Allergies:  Allergies  Allergen Reactions  . Benadryl [Diphenhydramine Hcl]     "drives me nuts"  . Red Blood Cells     PT. REFUSES ANY BLOOD PRODUCTS - JEHOVAH'S WITNESS.    Consent Signed: yes  Is patient diabetic? yes  CBG today? 124  Pregnant: no LMP: No LMP for male patient. (age 42-55)  Anticoagulants: no Anti-inflammatory: no Antibiotics: no  Procedure: Right Transforaminal injection  Position: Prone Start Time: 2:58  End Time: 3:01  Fluoro Time: 15 seconds  RN/CMA Shaquella Stamant,CMA [place],CMA    Time 219 3:04    BP 112/70 134/77    Pulse 99 98    Respirations 14 14    O2 Sat 99 100    S/S 6 6    Pain Level 8/10 0/10     D/C home with Wife Tammy, patient A & O X 3, D/C instructions reviewed, and sits independently.

## 2013-08-27 NOTE — Progress Notes (Deleted)
   Subjective:    Patient ID: Cody Raymond, male    DOB: 06-07-1971, 43 y.o.   MRN: [id number]  HPI    Review of Systems     Objective:   Physical Exam        Assessment & Plan:

## 2013-08-27 NOTE — Progress Notes (Signed)
Lumbar L4 Right selective nerve root block  under fluoroscopic guidance  Indication: Lumbosacral radiculitis is not relieved by medication management or other conservative care and interfering with self-care and mobility.   Informed consent was obtained after describing risk and benefits of the procedure with the patient, this includes bleeding, bruising, infection, paralysis and medication side effects.  The patient wishes to proceed and has given written consent.  Patient was placed in prone position.  The lumbar area was marked and prepped with Betadine.  It was entered with a 25-gauge 1-1/2 inch needle and one mL of 1% lidocaine was injected into the skin and subcutaneous tissue.  Then a 22-gauge 3.5 inch spinal needle was inserted into the Right L4-5 intervertebral foramen under AP, lateral, and oblique view.  Then a solution containing  2 mL of 1% MPF lidocaine was injected.  The patient tolerated procedure well.  Post procedure instructions were given.  Please see post procedure form.

## 2013-09-30 ENCOUNTER — Ambulatory Visit: Payer: Managed Care, Other (non HMO) | Admitting: Physical Medicine & Rehabilitation

## 2013-10-04 ENCOUNTER — Telehealth: Payer: Self-pay | Admitting: Family

## 2013-10-04 MED ORDER — LISINOPRIL 2.5 MG PO TABS: ORAL_TABLET | ORAL | Status: DC

## 2013-10-04 NOTE — Telephone Encounter (Signed)
Pt will be due for a follow up in February and does not have an appointment on file.  Please call pt to arrange appt. For February 10th or after.

## 2013-10-04 NOTE — Telephone Encounter (Signed)
Refill- lisinopril 2.5mg  tablet. Take one tablet by mouth daily. Qty 90 last fill 10.20.14

## 2013-10-05 NOTE — Telephone Encounter (Signed)
Left message for patient to return my call.

## 2013-11-01 ENCOUNTER — Encounter: Payer: Managed Care, Other (non HMO) | Attending: Physical Medicine & Rehabilitation

## 2013-11-01 ENCOUNTER — Ambulatory Visit (HOSPITAL_BASED_OUTPATIENT_CLINIC_OR_DEPARTMENT_OTHER): Payer: Managed Care, Other (non HMO) | Admitting: Physical Medicine & Rehabilitation

## 2013-11-01 ENCOUNTER — Encounter: Payer: Self-pay | Admitting: Physical Medicine & Rehabilitation

## 2013-11-01 VITALS — BP 114/71 | HR 103 | Resp 14 | Ht 68.0 in | Wt 220.0 lb

## 2013-11-01 DIAGNOSIS — I2510 Atherosclerotic heart disease of native coronary artery without angina pectoris: Secondary | ICD-10-CM | POA: Insufficient documentation

## 2013-11-01 DIAGNOSIS — I10 Essential (primary) hypertension: Secondary | ICD-10-CM | POA: Insufficient documentation

## 2013-11-01 DIAGNOSIS — IMO0002 Reserved for concepts with insufficient information to code with codable children: Secondary | ICD-10-CM

## 2013-11-01 DIAGNOSIS — G8928 Other chronic postprocedural pain: Secondary | ICD-10-CM | POA: Insufficient documentation

## 2013-11-01 DIAGNOSIS — E119 Type 2 diabetes mellitus without complications: Secondary | ICD-10-CM | POA: Insufficient documentation

## 2013-11-01 DIAGNOSIS — Z79899 Other long term (current) drug therapy: Secondary | ICD-10-CM | POA: Insufficient documentation

## 2013-11-01 DIAGNOSIS — E785 Hyperlipidemia, unspecified: Secondary | ICD-10-CM | POA: Insufficient documentation

## 2013-11-01 DIAGNOSIS — M961 Postlaminectomy syndrome, not elsewhere classified: Secondary | ICD-10-CM | POA: Insufficient documentation

## 2013-11-01 DIAGNOSIS — K219 Gastro-esophageal reflux disease without esophagitis: Secondary | ICD-10-CM | POA: Insufficient documentation

## 2013-11-01 DIAGNOSIS — M5416 Radiculopathy, lumbar region: Secondary | ICD-10-CM

## 2013-11-01 DIAGNOSIS — M79609 Pain in unspecified limb: Secondary | ICD-10-CM | POA: Insufficient documentation

## 2013-11-01 MED ORDER — TRAMADOL-ACETAMINOPHEN 37.5-325 MG PO TABS: 1.0000 | ORAL_TABLET | Freq: Four times a day (QID) | ORAL | Status: DC | PRN

## 2013-11-01 MED ORDER — DICLOFENAC SODIUM 1 % TD GEL: 2.0000 g | Freq: Four times a day (QID) | TRANSDERMAL | Status: DC

## 2013-11-01 NOTE — Progress Notes (Signed)
Subjective:    Patient ID: Cody Raymond, male    DOB: June 22, 1971, 43 y.o.   MRN: [id number]  HPI Underwent right L4 selective nerve root blocks under fluoroscopic guidance 08/27/2013. Had 100% pain relief for a couple hours. Preinjection pain 8/10 post injection pain 0/10 Continues complain of pain both in the back as well as down the right leg. Pain Inventory Average Pain 8 Pain Right Now 7 My pain is sharp, burning, stabbing, tingling and aching  In the last 24 hours, has pain interfered with the following? General activity 9 Relation with others 9 Enjoyment of life 9 What TIME of day is your pain at its worst? all Sleep (in general) Fair  Pain is worse with: walking, bending, sitting and standing Pain improves with: na Relief from Meds: 1  Mobility use a cane how many minutes can you walk? 10 ability to climb steps?  no do you drive?  yes  Function not employed: date last employed . I need assistance with the following:  bathing  Neuro/Psych weakness numbness tingling trouble walking spasms dizziness depression anxiety  Prior Studies Any changes since last visit?  no  Physicians involved in your care Any changes since last visit?  no   Family History  Problem Relation Age of Onset  . Colon cancer Paternal Grandfather   . Prostate cancer Paternal Grandfather   . Aneurysm Father   . Heart attack Father 36  . Coronary artery disease Father   . Colon cancer Paternal Uncle     x 2  . Colon polyps Mother   . Heart disease Mother   . Coronary artery disease Mother   . Breast cancer Maternal Grandmother   . Stomach cancer Brother   . Liver disease      unsure who it was   History   Social History  . Marital Status: Married    Spouse Name: N/A    Number of Children: N/A  . Years of Education: N/A   Social History Main Topics  . Smoking status: Never Smoker   . Smokeless tobacco: Never Used  . Alcohol Use: Yes     Comment: rare  . Drug  Use: No  . Sexual Activity: None   Other Topics Concern  . None   Social History Narrative   Holter monitor 08/2010: PVCs and sinus tachy.   Sleep Study (02/2008): mild sleep apnea, no indication for CPAP.   Past Surgical History  Procedure Laterality Date  . Appendectomy    . Patent ductus arterious repair      at age 58  . Colonoscopy w/ polypectomy  03/17/11    diminutive polyp  . Cardiac catheterization  2007    Clean Cardiac Cath [facility] Cards), with RCA 30% narrowing likely due to catheter induced spasm in 09/02/10.  . Back surgery     Past Medical History  Diagnosis Date  . Diabetes mellitus   . Asthma   . Enlarged prostate   . Hyperlipidemia   . Heart disease   . Hypertension   . Blood transfusion   . GERD (gastroesophageal reflux disease)   . Refusal of blood transfusions as patient is Jehovah's Witness   . Neuromuscular disorder     DJD  . Coronary artery disease   . CAD (coronary artery disease), non obstructive on cath 2011 04/05/2012   BP 114/71  Pulse 103  Resp 14  Ht 5\' 8"  (1.727 m)  Wt 220 lb (99.791 kg)  BMI 33.46 kg/m2  SpO2 96%  Opioid Risk Score:   Fall Risk Score: Moderate Fall Risk (6-13 points) (educated and home fall prevention handout given)  Review of Systems  Respiratory: Positive for cough, shortness of breath and wheezing.   Gastrointestinal: Positive for nausea, abdominal pain and diarrhea.  Endocrine:       High blood sugar  Musculoskeletal: Positive for gait problem.       Spasms  Neurological: Positive for dizziness, tremors and numbness.       Tingling  Psychiatric/Behavioral: Positive for dysphoric mood. The patient is nervous/anxious.   All other systems reviewed and are negative.       Objective:   Physical Exam  Right lower extremity no pain with hip knee or ankle range of motion. No pain over the Achilles tendon Tenderness or swelling No evidence of right knee effusion. Back has mild tenderness palpation  paraspinal muscles right side greater than left side L4 and L5 area Ambulates with a cane but no evidence of toe drag or knee instability     Assessment & Plan:  1. Chronic radiculopathy right L4 improved after selective nerve root block. We discussed treatment options. Can gradually increase gabapentin. Other options includes spinal cord stimulation trial. Recommend spine health or spine universe websites to further investigate for patient  2. Lumbar post laminectomy syndrome chronic low back pain. Increase Ultracet to 4 times per day 3. Right ankle pain this may be related to his radiculopathy but has had some tenderness in the Achilles area, trial diclofenac gel

## 2013-11-01 NOTE — Patient Instructions (Addendum)
Check spine health.com or spine universe to look up spinal cord stimulation  Try diclofenac gel for your ankle pain  The nerve block at L4 worked while the numbing medicine was working, that tells me that you have a chronic nerve root irritation on the right side at L4

## 2013-11-01 NOTE — Progress Notes (Deleted)
   Subjective:    Patient ID: Cody Raymond, male    DOB: 1971/07/11, 43 y.o.   MRN: [id number]  HPI  No improvements after lumbar injection 08/27/2013.  Review of Systems     Objective:   Physical Exam        Assessment & Plan:

## 2013-11-05 ENCOUNTER — Ambulatory Visit: Payer: Self-pay | Admitting: Family

## 2013-11-12 ENCOUNTER — Ambulatory Visit: Payer: Self-pay | Admitting: Cardiovascular Disease

## 2013-11-12 ENCOUNTER — Ambulatory Visit: Payer: Managed Care, Other (non HMO) | Admitting: Family

## 2013-11-20 ENCOUNTER — Ambulatory Visit (INDEPENDENT_AMBULATORY_CARE_PROVIDER_SITE_OTHER): Payer: Managed Care, Other (non HMO) | Admitting: Family

## 2013-11-20 ENCOUNTER — Encounter: Payer: Self-pay | Admitting: Family

## 2013-11-20 VITALS — BP 120/80 | HR 92 | Temp 98.4°F | Resp 16 | Ht 68.0 in | Wt 224.0 lb

## 2013-11-20 DIAGNOSIS — E781 Pure hyperglyceridemia: Secondary | ICD-10-CM

## 2013-11-20 DIAGNOSIS — R1032 Left lower quadrant pain: Secondary | ICD-10-CM

## 2013-11-20 DIAGNOSIS — E119 Type 2 diabetes mellitus without complications: Secondary | ICD-10-CM

## 2013-11-20 DIAGNOSIS — J45909 Unspecified asthma, uncomplicated: Secondary | ICD-10-CM

## 2013-11-20 DIAGNOSIS — E785 Hyperlipidemia, unspecified: Secondary | ICD-10-CM

## 2013-11-20 LAB — HEPATIC FUNCTION PANEL
ALBUMIN: 4.5 g/dL (ref 3.5–5.2)
ALK PHOS: 95 U/L (ref 39–117)
ALT: 31 U/L (ref 0–53)
AST: 22 U/L (ref 0–37)
BILIRUBIN INDIRECT: 0.6 mg/dL (ref 0.2–1.2)
BILIRUBIN TOTAL: 0.7 mg/dL (ref 0.2–1.2)
Bilirubin, Direct: 0.1 mg/dL (ref 0.0–0.3)
Total Protein: 7.5 g/dL (ref 6.0–8.3)

## 2013-11-20 LAB — LIPID PANEL
CHOLESTEROL: 229 mg/dL — AB (ref 0–200)
HDL: 37 mg/dL — ABNORMAL LOW (ref >39)
TRIGLYCERIDES: 681 mg/dL — AB (ref <150)
Total CHOL/HDL Ratio: 6.2 Ratio

## 2013-11-20 LAB — HEMOGLOBIN A1C
Hgb A1c MFr Bld: 6.2 % — ABNORMAL HIGH (ref <5.7)
MEAN PLASMA GLUCOSE: 131 mg/dL — AB (ref <117)

## 2013-11-20 LAB — BASIC METABOLIC PANEL WITH GFR
BUN: 19 mg/dL (ref 6–23)
CHLORIDE: 100 meq/L (ref 96–112)
CO2: 24 meq/L (ref 19–32)
CREATININE: 0.76 mg/dL (ref 0.50–1.35)
Calcium: 9.2 mg/dL (ref 8.4–10.5)
GFR, Est African American: >89 mL/min
Glucose, Bld: 85 mg/dL (ref 70–99)
POTASSIUM: 4.3 meq/L (ref 3.5–5.3)
SODIUM: 134 meq/L — AB (ref 135–145)

## 2013-11-20 MED ORDER — BUDESONIDE-FORMOTEROL FUMARATE 160-4.5 MCG/ACT IN AERO: 2.0000 | INHALATION_SPRAY | Freq: Two times a day (BID) | RESPIRATORY_TRACT | Status: DC

## 2013-11-20 MED ORDER — MONTELUKAST SODIUM 10 MG PO TABS: 10.0000 mg | ORAL_TABLET | Freq: Every day | ORAL | Status: DC

## 2013-11-20 NOTE — Assessment & Plan Note (Signed)
BP looks good today.  Obtain A1C, microalbumin, bmet. Continue metformin. (he is instructed to hold metformin x 48 hrs following IV contrast and check sugars during that time- call if sugar >300).

## 2013-11-20 NOTE — Patient Instructions (Signed)
Please complete lab work prior to leaving. Start singulair for nasal drainage and asthma. Start symbicort for asthma. Follow up in 3 months, sooner if problems/concerns.

## 2013-11-20 NOTE — Assessment & Plan Note (Signed)
On statin/fibrate- continue same, obtain follow up flp/lft.

## 2013-11-20 NOTE — Progress Notes (Signed)
Subjective:    Patient ID: Cody Raymond, male    DOB: [birth year], 43 y.o.   MRN: [id number]  HPI  Cody Raymond is a 43 yr old male who presents today today for follow up of multiple medical problems.   1) Diabetes-Last A1C in November was 6.2. He is maintained on metformin bid. Some mild loose stools but not intolerable.   Reports fasting sugars 120-125. Post prandial as high as 210, but generally 180-190.  He will schedule eye exam.  Reports neuropathic pain in feet/legs at night.  2) Hyperlipidemia- currently maintained on lipitor.  Last LDL not calculated due to hypertriglyceridemia. He is also on fenofibrate.  This was started in November. Denies myalgia.  Reports diet is fair.    3) Asthma/Allergies- reports + nasal drainage which he attributes to allergies. + post nasal drip. Bought otc flonase which is not helping much.  Continues zyrtec. Has been on singulair in the past but cost was an issue. Advair too expensive so he stopped taking. Reports that he wheezes every day.  4) Lower abdominal pain- he reports that he continues to have lower abdominal pain and has been treated twice for UTI. Reports pain is located in the left lower quadrant.  Took cipro without improvement.  He reports that this has been going on for "almost a year."    Review of Systems    see HPI  Past Medical History  Diagnosis Date  . Diabetes mellitus   . Asthma   . Enlarged prostate   . Hyperlipidemia   . Heart disease   . Hypertension   . Blood transfusion   . GERD (gastroesophageal reflux disease)   . Refusal of blood transfusions as patient is Jehovah's Witness   . Neuromuscular disorder     DJD  . Coronary artery disease   . CAD (coronary artery disease), non obstructive on cath 2011 04/05/2012    History   Social History  . Marital Status: Married    Spouse Name: N/A    Number of Children: N/A  . Years of Education: N/A   Occupational History  . Not on file.   Social History Main  Topics  . Smoking status: Never Smoker   . Smokeless tobacco: Never Used  . Alcohol Use: Yes     Comment: rare  . Drug Use: No  . Sexual Activity: Not on file   Other Topics Concern  . Not on file   Social History Narrative   Holter monitor 08/2010: PVCs and sinus tachy.   Sleep Study (02/2008): mild sleep apnea, no indication for CPAP.    Past Surgical History  Procedure Laterality Date  . Appendectomy    . Patent ductus arterious repair      at age 24  . Colonoscopy w/ polypectomy  03/17/11    diminutive polyp  . Cardiac catheterization  2007    Clean Cardiac Cath [facility] Cards), with RCA 30% narrowing likely due to catheter induced spasm in 09/02/10.  . Back surgery      Family History  Problem Relation Age of Onset  . Colon cancer Paternal Grandfather   . Prostate cancer Paternal Grandfather   . Aneurysm Father   . Heart attack Father 43  . Coronary artery disease Father   . Colon cancer Paternal Uncle     x 2  . Colon polyps Mother   . Heart disease Mother   . Coronary artery disease Mother   . Breast cancer Maternal Grandmother   .  Stomach cancer Brother   . Liver disease      unsure who it was    Allergies  Allergen Reactions  . Benadryl [Diphenhydramine Hcl]     "drives me nuts"  . Red Blood Cells     PT. REFUSES ANY BLOOD PRODUCTS - JEHOVAH'S WITNESS.    Current Outpatient Prescriptions on File Prior to Visit  Medication Sig Dispense Refill  . albuterol (PROVENTIL HFA;VENTOLIN HFA) 108 (90 BASE) MCG/ACT inhaler Inhale 2 puffs into the lungs every 6 (six) hours as needed. Use with spacer, for asthma  1 Inhaler  5  . albuterol (PROVENTIL) (2.5 MG/3ML) 0.083% nebulizer solution Take 3 mLs (2.5 mg total) by nebulization every 6 (six) hours as needed. For wheezing  75 mL  5  . aspirin 325 MG tablet Take 325 mg by mouth daily.      Marland Kitchen atorvastatin (LIPITOR) 80 MG tablet Take 1 tablet (80 mg total) by mouth daily.  30 tablet  2  . betamethasone  dipropionate (DIPROLENE) 0.05 % cream Apply 1 application topically every evening.      . cetirizine (ZYRTEC) 10 MG tablet Take 10 mg by mouth daily.      . clonazePAM (KLONOPIN) 1 MG tablet       . diclofenac sodium (VOLTAREN) 1 % GEL Apply 2 g topically 4 (four) times daily.  3 Tube  1  . fenofibrate (TRICOR) 145 MG tablet Take 1 tablet (145 mg total) by mouth daily.  30 tablet  2  . fish oil-omega-3 fatty acids 1000 MG capsule Take 1 g by mouth daily.       Marland Kitchen gabapentin (NEURONTIN) 300 MG capsule Take 1 capsule (300 mg total) by mouth 3 (three) times daily.  90 capsule  2  . ketoconazole (NIZORAL) 2 % shampoo       . lansoprazole (PREVACID) 15 MG capsule Take 15 mg by mouth daily.      Marland Kitchen lisinopril (PRINIVIL,ZESTRIL) 2.5 MG tablet TAKE 1 TABLET BY MOUTH DAILY  90 tablet  0  . metFORMIN (GLUCOPHAGE) 500 MG tablet Take 1 tablet (500 mg total) by mouth 2 (two) times daily with a meal.  60 tablet  5  . metoprolol tartrate (LOPRESSOR) 25 MG tablet Take 12.5 mg by mouth 2 (two) times daily.      . ramelteon (ROZEREM) 8 MG tablet Take 1 tablet (8 mg total) by mouth at bedtime.  30 tablet  3  . sertraline (ZOLOFT) 50 MG tablet TAKE 1 TABLET (50 MG TOTAL) BY MOUTH DAILY.  90 tablet  1  . sildenafil (VIAGRA) 50 MG tablet Take 1 tablet (50 mg total) by mouth daily as needed for erectile dysfunction.  10 tablet  2  . tamsulosin (FLOMAX) 0.4 MG CAPS Take 1 capsule (0.4 mg total) by mouth daily.  30 capsule  3  . traMADol-acetaminophen (ULTRACET) 37.5-325 MG per tablet Take 1 tablet by mouth every 6 (six) hours as needed.  120 tablet  2  . traZODone (DESYREL) 100 MG tablet       . zolpidem (AMBIEN) 10 MG tablet Take 1 tablet (10 mg total) by mouth at bedtime as needed for sleep.  30 tablet  0  . nitroGLYCERIN (NITROSTAT) 0.4 MG SL tablet Place 0.4 mg under the tongue every 5 (five) minutes as needed. For chest pain      . [DISCONTINUED] niacin (NIASPAN) 1000 MG CR tablet Take 1 tablet (1,000 mg total) by  mouth at bedtime.  30 tablet  6   No current facility-administered medications on file prior to visit.    BP 120/80  Pulse 92  Temp(Src) 98.4 F (36.9 C) (Oral)  Resp 16  Ht 5\' 8"  (1.727 m)  Wt 224 lb 0.6 oz (101.624 kg)  BMI 34.07 kg/m2  SpO2 95%    Objective:   Physical Exam  Constitutional: He is oriented to person, place, and time. He appears well-developed and well-nourished. No distress.  HENT:  Head: Normocephalic and atraumatic.  Cardiovascular: Normal rate and regular rhythm.   No murmur heard. Pulmonary/Chest: Effort normal. He has wheezes.  Musculoskeletal: He exhibits no edema.  Neurological: He is alert and oriented to person, place, and time.  Skin: Skin is warm and dry.  Psychiatric: He has a normal mood and affect. His behavior is normal. Judgment and thought content normal.          Assessment & Plan:

## 2013-11-20 NOTE — Progress Notes (Signed)
Pre visit review using our clinic review tool, if applicable. No additional management support is needed unless otherwise documented below in the visit note. 

## 2013-11-20 NOTE — Assessment & Plan Note (Signed)
Will refer for CT abdomen/pelvis for further evalution.

## 2013-11-20 NOTE — Assessment & Plan Note (Signed)
Deteriorated.  Advised pt to start singulair (now generic and will hopefully be more affordable) and symbicort 160- a sample was provided today along with a coupon/copay card.

## 2013-11-21 ENCOUNTER — Ambulatory Visit (HOSPITAL_BASED_OUTPATIENT_CLINIC_OR_DEPARTMENT_OTHER): Admission: RE | Admit: 2013-11-21 | Payer: Managed Care, Other (non HMO) | Source: Ambulatory Visit

## 2013-11-21 ENCOUNTER — Other Ambulatory Visit: Payer: Self-pay | Admitting: Internal Medicine

## 2013-11-21 ENCOUNTER — Ambulatory Visit (INDEPENDENT_AMBULATORY_CARE_PROVIDER_SITE_OTHER): Payer: Managed Care, Other (non HMO)

## 2013-11-21 ENCOUNTER — Telehealth: Payer: Self-pay

## 2013-11-21 DIAGNOSIS — R1032 Left lower quadrant pain: Secondary | ICD-10-CM

## 2013-11-21 DIAGNOSIS — K7689 Other specified diseases of liver: Secondary | ICD-10-CM

## 2013-11-21 LAB — MICROALBUMIN / CREATININE URINE RATIO
CREATININE, URINE: 214.6 mg/dL
MICROALB UR: 1.15 mg/dL (ref 0.00–1.89)
Microalb Creat Ratio: 5.4 mg/g (ref 0.0–30.0)

## 2013-11-21 MED ORDER — IOHEXOL 300 MG/ML  SOLN: 100.0000 mL | Freq: Once | INTRAMUSCULAR | Status: AC | PRN

## 2013-11-21 NOTE — Discharge Instructions (Signed)
° ° °  Outpatient Metformin Instructions (Glucophage, Glucovance, Fortamet, Riomet, Metaglip, Glumetza, Actoplus met  Avandamet, Janumet)   Patient: Cody Raymond                                                11/21/2013:    Radiology Exam:     As part of your exam today in the Radiology Department, you were given a radiographic contrast material or x-ray dye.  Because you have had this contrast material and you are taking a Metformin drug (Glucophage, Glucovance, Avandamet, Fortamet, Riomet, Metaglip, Glumetza, Actoplus met, Actoplus Met XR, Prandimet or Janumet), please observe the following instructions:   DO NOT  Take this medication for 48 hours after your exam.  Because you have normal renal function and have no comorbidities, you may restart your medication in 48 hours with no need for a renal function test or consultation with your physician.  You have normal renal function but have some comorbidities.  Comorbidities include liver disease, alcohol overuse, heart failure, myocardial or muscular ischemia, sepsis, or other severe infection.  Therefore you should consult your physician before restarting your medication.  You have impaired renal function.  You should consult your physician before restarting your medication and you are advised to get a renal function test before restarting your medication.  Please discuss this with your physician.   Call your doctor before you start taking this medication again.  Your doctor may want to check your kidney function before you start taking this medication again.  I understand these instructions and have had an opportunity to discuss them with Radiology Department personnel.

## 2013-11-21 NOTE — Telephone Encounter (Signed)
Relevant patient education assigned to patient using Emmi. ° °

## 2013-11-22 ENCOUNTER — Encounter: Payer: Self-pay | Admitting: *Deleted

## 2013-11-23 ENCOUNTER — Telehealth: Payer: Self-pay | Admitting: Family

## 2013-11-23 NOTE — Telephone Encounter (Signed)
Sugar appears well controlled.  Triglycerides are very elevated.  I would like him to increase fish oil from 1000mg  daily to 2000mg  bid. Also- is he taking tricor every day or is he missing doses? If he is taking tricor every day, then I would like to have him continue tricor, but change atorvastatin to crestor 20mg  once daily #30 with 2 refills and add Zetia 10mg  once daily- #30 with 2 refills then repeat LFT/FLP in 6 weeks (dx is hyperlipidemia).  Continue to work on avoiding concentrated sweets, white fluffy carbs.

## 2013-11-25 ENCOUNTER — Encounter: Payer: Self-pay | Admitting: Cardiovascular Disease

## 2013-11-25 ENCOUNTER — Ambulatory Visit (INDEPENDENT_AMBULATORY_CARE_PROVIDER_SITE_OTHER): Payer: Managed Care, Other (non HMO) | Admitting: Cardiovascular Disease

## 2013-11-25 VITALS — BP 114/80 | HR 79 | Resp 16 | Ht 68.0 in | Wt 223.5 lb

## 2013-11-25 DIAGNOSIS — I2510 Atherosclerotic heart disease of native coronary artery without angina pectoris: Secondary | ICD-10-CM

## 2013-11-25 DIAGNOSIS — Z79899 Other long term (current) drug therapy: Secondary | ICD-10-CM

## 2013-11-25 DIAGNOSIS — R5381 Other malaise: Secondary | ICD-10-CM

## 2013-11-25 DIAGNOSIS — R079 Chest pain, unspecified: Secondary | ICD-10-CM

## 2013-11-25 DIAGNOSIS — R55 Syncope and collapse: Secondary | ICD-10-CM

## 2013-11-25 DIAGNOSIS — E781 Pure hyperglyceridemia: Secondary | ICD-10-CM

## 2013-11-25 DIAGNOSIS — R5383 Other fatigue: Secondary | ICD-10-CM

## 2013-11-25 LAB — PACEMAKER DEVICE OBSERVATION

## 2013-11-25 NOTE — Telephone Encounter (Signed)
Patient called in requesting CT and lab results

## 2013-11-25 NOTE — Telephone Encounter (Signed)
Please advise re: CT result?

## 2013-11-25 NOTE — Patient Instructions (Addendum)
Your physician has requested that you have an echocardiogram. Echocardiography is a painless test that uses sound waves to create images of your heart. It provides your doctor with information about the size and shape of your heart and how well your heart's chambers and valves are working. This procedure takes approximately one hour. There are no restrictions for this procedure.  Your physician has requested that you have an exercise tolerance test. For further information please visit [email or web address]. Please also follow instruction sheet, as given.  Your physician recommends that you return for lab work in: 4-5 days before Loop Recorder explant.  You will be scheduled to have your Loop Recorder removed by Dr. Sallyanne Kuster.  Your physician recommends that you schedule a follow-up appointment in: 6 months with Dr. Sallyanne Kuster.

## 2013-11-26 LAB — MDC_IDC_ENUM_SESS_TYPE_INCLINIC: Implantable Pulse Generator Model: 9529

## 2013-11-26 NOTE — Telephone Encounter (Signed)
CT results reviewed. CT normal with exception of fatty liver.  I suspect that his pain is musculoskeletal in nature.  Call if symptoms worsen.

## 2013-11-26 NOTE — Telephone Encounter (Signed)
Notified pt of below results and he voices understanding. He states that he had been off of tricor and lipitor for about 1 month and has just restarted them.

## 2013-11-27 ENCOUNTER — Telehealth: Payer: Self-pay

## 2013-11-27 NOTE — Telephone Encounter (Signed)
Patient called requesting something else for pain.  The medication he is taking is not helping.  His leg is acting up and is numb.  Please advise.

## 2013-11-27 NOTE — Telephone Encounter (Signed)
Left message for patient to call office regarding his increased pain.

## 2013-11-28 ENCOUNTER — Ambulatory Visit (HOSPITAL_COMMUNITY)
Admission: RE | Admit: 2013-11-28 | Discharge: 2013-11-28 | Disposition: A | Discharge status: Home / Self Care | Payer: Managed Care, Other (non HMO) | Source: Ambulatory Visit | Attending: Cardiovascular Disease | Admitting: Cardiovascular Disease

## 2013-11-28 DIAGNOSIS — I517 Cardiomegaly: Secondary | ICD-10-CM

## 2013-11-28 DIAGNOSIS — R079 Chest pain, unspecified: Secondary | ICD-10-CM | POA: Insufficient documentation

## 2013-11-28 NOTE — Progress Notes (Signed)
  Echocardiogram 2D Echocardiogram has been performed.  Cody Raymond, Cody Raymond 11/28/2013, 9:12 AM

## 2013-11-28 NOTE — Progress Notes (Signed)
LM w/results 11/28/13 1:25pm

## 2013-11-29 NOTE — Telephone Encounter (Signed)
Left patient a voicemail to return call to clinic. Patient may increase Tramadol 50 mg every 6 hours, Increase Gabapentin 400 mg three times a day, and he needs to make a appt for a right L4 Transforaminal ESI.

## 2013-11-29 NOTE — Telephone Encounter (Signed)
Increase tramadol to 50mg  po q 6 hrs Increase Gabapentin 400mg  TID Schedule for repeat R L4 transforaminal ESI

## 2013-12-01 ENCOUNTER — Encounter: Payer: Self-pay | Admitting: Cardiovascular Disease

## 2013-12-01 NOTE — Assessment & Plan Note (Signed)
I think a simple treadmill exercise stress test should suffice to evaluate for progression of coronary disease in this gentleman who had minor coronary atherosclerosis by cardiac catheterization in 2011. Suspect his symptoms are more related to emotional outbursts,  rather than true coronary disease. His shortness of breath is not easily explained and I have recommended that he have a repeat echocardiogram since he already is known to have left ventricular hypertrophy. This may allow [place] to quantify left atrial pressure to see if he has evidence of decompensated diastolic dysfunction.

## 2013-12-01 NOTE — Assessment & Plan Note (Signed)
No recurrence of his loop recorder implantation. He would like the device removed.

## 2013-12-01 NOTE — Progress Notes (Signed)
Patient ID: Cody Raymond, male   DOB: 03/21/1971, 43 y.o.   MRN: [id number]     Reason for office visit Hypertrophic cardiomyopathy, palpitations, hyperlipidemia,  Cordell returns for routine visit but has been having some chest discomfort. This is associated with emotional outbursts in stressful situations related to teenage children. It does not occur with physical activity. He feels short of breath all the time. Has occasional dizziness when he takes his medication. He has not had syncope and denies edema. He had a loop recorder that was implanted almost 3 years ago for an episode of syncope this sounded possibly arrhythmogenic in the setting of a right bundle branch block. No arrhythmia has been detected by the device in almost 3 years. He finds the site of the loop recorder uncomfortable and would like it removed when the battery reaches ERI. He has reactive airway disease and takes albuterol. He is on a statin for hyperlipidemia but has a residual hypertriglyceridemia and has controlled diabetes mellitus. A recent hemoglobin A1c was 6.2% (he takes metformin monotherapy for diabetes) total cholesterol is 229 crit was 2681 HDL 37. At age 42 he underwent surgical repair of a patent ductus arteriosus. In 2011 he underwent cardiac catheterization which showed very minor coronary atherosclerosis, similar to 2009. This was done for a "false positive" nuclear stress test and chest pain.   Allergies  Allergen Reactions  . Benadryl [Diphenhydramine Hcl]     "drives me nuts"  . Red Blood Cells     PT. REFUSES ANY BLOOD PRODUCTS - JEHOVAH'S WITNESS.    Current Outpatient Prescriptions  Medication Sig Dispense Refill  . albuterol (PROVENTIL HFA;VENTOLIN HFA) 108 (90 BASE) MCG/ACT inhaler Inhale 2 puffs into the lungs every 6 (six) hours as needed. Use with spacer, for asthma  1 Inhaler  5  . albuterol (PROVENTIL) (2.5 MG/3ML) 0.083% nebulizer solution Take 3 mLs (2.5 mg total) by nebulization  every 6 (six) hours as needed. For wheezing  75 mL  5  . aspirin 325 MG tablet Take 325 mg by mouth daily.      Marland Kitchen atorvastatin (LIPITOR) 80 MG tablet Take 1 tablet (80 mg total) by mouth daily.  30 tablet  2  . betamethasone dipropionate (DIPROLENE) 0.05 % cream Apply 1 application topically every evening.      . budesonide-formoterol (SYMBICORT) 160-4.5 MCG/ACT inhaler Inhale 2 puffs into the lungs 2 (two) times daily.  1 Inhaler  2  . cetirizine (ZYRTEC) 10 MG tablet Take 10 mg by mouth daily.      . clonazePAM (KLONOPIN) 1 MG tablet       . diclofenac sodium (VOLTAREN) 1 % GEL Apply 2 g topically 4 (four) times daily.  3 Tube  1  . fenofibrate (TRICOR) 145 MG tablet Take 1 tablet (145 mg total) by mouth daily.  30 tablet  2  . fish oil-omega-3 fatty acids 1000 MG capsule Take 2 g by mouth 2 (two) times daily.       Marland Kitchen gabapentin (NEURONTIN) 300 MG capsule Take 1 capsule (300 mg total) by mouth 3 (three) times daily.  90 capsule  2  . ketoconazole (NIZORAL) 2 % shampoo       . lansoprazole (PREVACID) 15 MG capsule Take 15 mg by mouth daily.      Marland Kitchen lisinopril (PRINIVIL,ZESTRIL) 2.5 MG tablet TAKE 1 TABLET BY MOUTH DAILY  90 tablet  0  . metFORMIN (GLUCOPHAGE) 500 MG tablet Take 1 tablet (500 mg total) by mouth 2 (two)  times daily with a meal.  60 tablet  5  . metoprolol tartrate (LOPRESSOR) 25 MG tablet Take 12.5 mg by mouth 2 (two) times daily.      . montelukast (SINGULAIR) 10 MG tablet Take 1 tablet (10 mg total) by mouth at bedtime.  30 tablet  3  . ramelteon (ROZEREM) 8 MG tablet Take 1 tablet (8 mg total) by mouth at bedtime.  30 tablet  3  . sertraline (ZOLOFT) 50 MG tablet TAKE 1 TABLET (50 MG TOTAL) BY MOUTH DAILY.  90 tablet  1  . sildenafil (VIAGRA) 50 MG tablet Take 1 tablet (50 mg total) by mouth daily as needed for erectile dysfunction.  10 tablet  2  . tamsulosin (FLOMAX) 0.4 MG CAPS Take 1 capsule (0.4 mg total) by mouth daily.  30 capsule  3  . traMADol-acetaminophen (ULTRACET)  37.5-325 MG per tablet Take 1 tablet by mouth every 6 (six) hours as needed.  120 tablet  2  . traZODone (DESYREL) 100 MG tablet       . zolpidem (AMBIEN) 10 MG tablet Take 1 tablet (10 mg total) by mouth at bedtime as needed for sleep.  30 tablet  0  . nitroGLYCERIN (NITROSTAT) 0.4 MG SL tablet Place 0.4 mg under the tongue every 5 (five) minutes as needed. For chest pain      . [DISCONTINUED] niacin (NIASPAN) 1000 MG CR tablet Take 1 tablet (1,000 mg total) by mouth at bedtime.  30 tablet  6   No current facility-administered medications for this visit.    Past Medical History  Diagnosis Date  . Diabetes mellitus   . Asthma   . Enlarged prostate   . Hyperlipidemia   . Heart disease   . Hypertension   . Blood transfusion   . GERD (gastroesophageal reflux disease)   . Refusal of blood transfusions as patient is Jehovah's Witness   . Neuromuscular disorder     DJD  . Coronary artery disease   . CAD (coronary artery disease), non obstructive on cath 2011 04/05/2012  . H/O syncope     Past Surgical History  Procedure Laterality Date  . Appendectomy    . Patent ductus arterious repair      at age 70  . Colonoscopy w/ polypectomy  03/17/11    diminutive polyp  . Cardiac catheterization  2007    Clean Cardiac Cath [facility] Cards), with RCA 30% narrowing likely due to catheter induced spasm in 09/02/10.  . Back surgery    . Cardiac catheterization  09/02/2010    mod. nonobstructive disease in the RCA and CX, tortuous LAD  . Nm myocar perf wall motion  01/25/2008    mild anteroapical wall ischemia  . Loop recorder implant  04/06/2012    Reveal XT 4529    Family History  Problem Relation Age of Onset  . Colon cancer Paternal Grandfather   . Prostate cancer Paternal Grandfather   . Aneurysm Father   . Heart attack Father 43  . Coronary artery disease Father   . Colon cancer Paternal Uncle     x 2  . Colon polyps Mother   . Heart disease Mother   . Coronary artery disease  Mother   . Breast cancer Maternal Grandmother   . Stomach cancer Brother   . Liver disease      unsure who it was    History   Social History  . Marital Status: Married    Spouse Name: N/A  Number of Children: N/A  . Years of Education: N/A   Occupational History  . Not on file.   Social History Main Topics  . Smoking status: Never Smoker   . Smokeless tobacco: Never Used  . Alcohol Use: Yes     Comment: rare  . Drug Use: No  . Sexual Activity: Not on file   Other Topics Concern  . Not on file   Social History Narrative   Holter monitor 08/2010: PVCs and sinus tachy.   Sleep Study (02/2008): mild sleep apnea, no indication for CPAP.    Review of systems: The patient specifically denies any chest pain with exertion, dyspnea  with exertion, orthopnea, paroxysmal nocturnal dyspnea, syncope, palpitations, focal neurological deficits, intermittent claudication, lower extremity edema, unexplained weight gain, cough, hemoptysis or wheezing.  The patient also denies abdominal pain, nausea, vomiting, dysphagia, diarrhea, constipation, polyuria, polydipsia, dysuria, hematuria, frequency, urgency, abnormal bleeding or bruising, fever, chills, unexpected weight changes, mood swings, change in skin or hair texture, change in voice quality, auditory or visual problems, allergic reactions or rashes, new musculoskeletal complaints other than usual "aches and pains".   PHYSICAL EXAM BP 114/80  Pulse 79  Resp 16  Ht 5\' 8"  (1.727 m)  Wt 101.379 kg (223 lb 8 oz)  BMI 33.99 kg/m2  General: Alert, oriented x3, no distress Head: no evidence of trauma, PERRL, EOMI, no exophtalmos or lid lag, no myxedema, no xanthelasma; normal ears, nose and oropharynx Neck: normal jugular venous pulsations and no hepatojugular reflux; brisk carotid pulses without delay and no carotid bruits Chest: clear to auscultation, no signs of consolidation by percussion or palpation, normal fremitus, symmetrical  and full respiratory excursions Cardiovascular: normal position and quality of the apical impulse, regular rhythm, normal first and second heart sounds, no murmurs, rubs or gallops Abdomen: no tenderness or distention, no masses by palpation, no abnormal pulsatility or arterial bruits, normal bowel sounds, no hepatosplenomegaly Extremities: no clubbing, cyanosis or edema; 2+ radial, ulnar and brachial pulses bilaterally; 2+ right femoral, posterior tibial and dorsalis pedis pulses; 2+ left femoral, posterior tibial and dorsalis pedis pulses; no subclavian or femoral bruits Neurological: grossly nonfocal   EKG: Sinus rhythm, incomplete right bundle branch block, possible right ventricular hypertrophy, rightward axis, nonspecific T-wave flattening across the anterolateral precordial leads no change from previous tracings  Lipid Panel     Component Value Date/Time   CHOL 229* 11/20/2013 1150   TRIG 681* 11/20/2013 1150   HDL 37* 11/20/2013 1150   CHOLHDL 6.2 11/20/2013 1150   VLDL NOT CALC 11/20/2013 1150   LDLCALC NOT CALC 11/20/2013 1150    BMET    Component Value Date/Time   NA 134* 11/20/2013 1150   K 4.3 11/20/2013 1150   CL 100 11/20/2013 1150   CO2 24 11/20/2013 1150   GLUCOSE 85 11/20/2013 1150   BUN 19 11/20/2013 1150   CREATININE 0.76 11/20/2013 1150   CREATININE 0.82 04/10/2012 1721   CALCIUM 9.2 11/20/2013 1150   GFRNONAA >90 04/10/2012 1721   GFRAA >90 04/10/2012 1721     ASSESSMENT AND PLAN CAD (coronary artery disease), non obstructive on cath 2011 I think a simple treadmill exercise stress test should suffice to evaluate for progression of coronary disease in this gentleman who had minor coronary atherosclerosis by cardiac catheterization in 2011. Suspect his symptoms are more related to emotional outbursts,  rather than true coronary disease. His shortness of breath is not easily explained and I have recommended that he have a  repeat echocardiogram since he already is known to have left  ventricular hypertrophy. This may allow [place] to quantify left atrial pressure to see if he has evidence of decompensated diastolic dysfunction.  Syncope No recurrence of his loop recorder implantation. He would like the device removed.  Hypertriglyceridemia He has persistent hypertriglyceridemia despite fairly well controlled glucose levels and treatment with a fibroid and a statin. I really think the solution is weight loss and regular physical activity rather than more medications. He should follow a low carbohydrate diet, especially avoiding sweets and starches with high glycemic index   Patient Instructions  Your physician has requested that you have an echocardiogram. Echocardiography is a painless test that uses sound waves to create images of your heart. It provides your doctor with information about the size and shape of your heart and how well your heart's chambers and valves are working. This procedure takes approximately one hour. There are no restrictions for this procedure.  Your physician has requested that you have an exercise tolerance test. For further information please visit [email or web address]. Please also follow instruction sheet, as given.  Your physician recommends that you return for lab work in: 4-5 days before Loop Recorder explant.  You will be scheduled to have your Loop Recorder removed by Dr. Sallyanne Kuster.  Your physician recommends that you schedule a follow-up appointment in: 6 months with Dr. Sallyanne Kuster.          Orders Placed This Encounter  Procedures  . CBC  . TSH  . Comprehensive metabolic panel  . Implantable device check  . EKG 12-Lead  . Exercise Tolerance Test  . 2D Echocardiogram without contrast  . LOOP RECORDER EXPLANT   No orders of the defined types were placed in this encounter.    Holli Humbles, MD, [place] [phone] office [phone] pager

## 2013-12-01 NOTE — Assessment & Plan Note (Signed)
He has persistent hypertriglyceridemia despite fairly well controlled glucose levels and treatment with a fibroid and a statin. I really think the solution is weight loss and regular physical activity rather than more medications. He should follow a low carbohydrate diet, especially avoiding sweets and starches with high glycemic index

## 2013-12-03 ENCOUNTER — Encounter: Payer: Self-pay | Admitting: Cardiovascular Disease

## 2013-12-05 ENCOUNTER — Ambulatory Visit (HOSPITAL_COMMUNITY)
Admission: RE | Admit: 2013-12-05 | Discharge: 2013-12-05 | Disposition: A | Discharge status: Home / Self Care | Payer: Managed Care, Other (non HMO) | Source: Ambulatory Visit | Attending: Cardiovascular Disease | Admitting: Cardiovascular Disease

## 2013-12-05 DIAGNOSIS — R079 Chest pain, unspecified: Secondary | ICD-10-CM | POA: Insufficient documentation

## 2013-12-27 ENCOUNTER — Other Ambulatory Visit: Payer: Self-pay | Admitting: *Deleted

## 2013-12-30 ENCOUNTER — Ambulatory Visit (HOSPITAL_BASED_OUTPATIENT_CLINIC_OR_DEPARTMENT_OTHER): Payer: Managed Care, Other (non HMO) | Admitting: Physical Medicine & Rehabilitation

## 2013-12-30 ENCOUNTER — Other Ambulatory Visit: Payer: Self-pay | Admitting: Family

## 2013-12-30 ENCOUNTER — Encounter: Payer: Self-pay | Admitting: Physical Medicine & Rehabilitation

## 2013-12-30 ENCOUNTER — Encounter: Payer: Managed Care, Other (non HMO) | Attending: Physical Medicine & Rehabilitation

## 2013-12-30 VITALS — BP 114/70 | HR 91 | Resp 14 | Ht 68.0 in | Wt 223.6 lb

## 2013-12-30 DIAGNOSIS — I2510 Atherosclerotic heart disease of native coronary artery without angina pectoris: Secondary | ICD-10-CM | POA: Insufficient documentation

## 2013-12-30 DIAGNOSIS — M2022 Hallux rigidus, left foot: Secondary | ICD-10-CM

## 2013-12-30 DIAGNOSIS — M5416 Radiculopathy, lumbar region: Secondary | ICD-10-CM

## 2013-12-30 DIAGNOSIS — M961 Postlaminectomy syndrome, not elsewhere classified: Secondary | ICD-10-CM | POA: Insufficient documentation

## 2013-12-30 DIAGNOSIS — Z79899 Other long term (current) drug therapy: Secondary | ICD-10-CM | POA: Insufficient documentation

## 2013-12-30 DIAGNOSIS — G8928 Other chronic postprocedural pain: Secondary | ICD-10-CM | POA: Insufficient documentation

## 2013-12-30 DIAGNOSIS — M2020 Hallux rigidus, unspecified foot: Secondary | ICD-10-CM

## 2013-12-30 DIAGNOSIS — I10 Essential (primary) hypertension: Secondary | ICD-10-CM | POA: Insufficient documentation

## 2013-12-30 DIAGNOSIS — M7660 Achilles tendinitis, unspecified leg: Secondary | ICD-10-CM

## 2013-12-30 DIAGNOSIS — M7661 Achilles tendinitis, right leg: Secondary | ICD-10-CM

## 2013-12-30 DIAGNOSIS — IMO0002 Reserved for concepts with insufficient information to code with codable children: Secondary | ICD-10-CM

## 2013-12-30 DIAGNOSIS — K219 Gastro-esophageal reflux disease without esophagitis: Secondary | ICD-10-CM | POA: Insufficient documentation

## 2013-12-30 DIAGNOSIS — G8929 Other chronic pain: Secondary | ICD-10-CM

## 2013-12-30 DIAGNOSIS — M549 Dorsalgia, unspecified: Secondary | ICD-10-CM

## 2013-12-30 DIAGNOSIS — M79609 Pain in unspecified limb: Secondary | ICD-10-CM | POA: Insufficient documentation

## 2013-12-30 DIAGNOSIS — M2140 Flat foot [pes planus] (acquired), unspecified foot: Secondary | ICD-10-CM

## 2013-12-30 DIAGNOSIS — E785 Hyperlipidemia, unspecified: Secondary | ICD-10-CM | POA: Insufficient documentation

## 2013-12-30 DIAGNOSIS — E119 Type 2 diabetes mellitus without complications: Secondary | ICD-10-CM | POA: Insufficient documentation

## 2013-12-30 MED ORDER — GABAPENTIN 300 MG PO CAPS: 300.0000 mg | ORAL_CAPSULE | Freq: Three times a day (TID) | ORAL | Status: DC

## 2013-12-30 MED ORDER — TRAMADOL-ACETAMINOPHEN 37.5-325 MG PO TABS: 1.0000 | ORAL_TABLET | Freq: Four times a day (QID) | ORAL | Status: DC | PRN

## 2013-12-30 NOTE — Patient Instructions (Signed)
Referral to the foot center  Next appointment is in 3 months  Increased dose of gabapentin 3 tablets at night 1 tablet in the morning one tablet in the afternoon  Continue Ultracet 4 tablets per day

## 2013-12-30 NOTE — Progress Notes (Signed)
Subjective:    Patient ID: Cody Raymond, male    DOB: 1971/01/26, 43 y.o.   MRN: [id number]  HPI Underwent right L4 selective nerve root blocks under fluoroscopic guidance 08/27/2013. Had 100% pain relief for a couple hours. Preinjection pain 8/10 post injection pain 0/10  Continues complain of pain both in the back as well as down the right leg. Comp case settled a couple years ago Now applying for disability  Pain Inventory Average Pain 9 Pain Right Now 7 My pain is sharp, burning, dull, stabbing, tingling and aching  In the last 24 hours, has pain interfered with the following? General activity 7 Relation with others 6 Enjoyment of life 7 What TIME of day is your pain at its worst? all Sleep (in general) Poor  Pain is worse with: walking, bending, sitting and standing Pain improves with: rest and medication Relief from Meds: 4  Mobility use a cane do you drive?  yes  Function disabled: date disabled . I need assistance with the following:  bathing  Neuro/Psych weakness trouble walking depression anxiety  Prior Studies Any changes since last visit?  no  Physicians involved in your care Any changes since last visit?  no   Family History  Problem Relation Age of Onset  . Colon cancer Paternal Grandfather   . Prostate cancer Paternal Grandfather   . Aneurysm Father   . Heart attack Father 8  . Coronary artery disease Father   . Colon cancer Paternal Uncle     x 2  . Colon polyps Mother   . Heart disease Mother   . Coronary artery disease Mother   . Breast cancer Maternal Grandmother   . Stomach cancer Brother   . Liver disease      unsure who it was   History   Social History  . Marital Status: Married    Spouse Name: N/A    Number of Children: N/A  . Years of Education: N/A   Social History Main Topics  . Smoking status: Never Smoker   . Smokeless tobacco: Never Used  . Alcohol Use: Yes     Comment: rare  . Drug Use: No  . Sexual  Activity: None   Other Topics Concern  . None   Social History Narrative   Holter monitor 08/2010: PVCs and sinus tachy.   Sleep Study (02/2008): mild sleep apnea, no indication for CPAP.   Past Surgical History  Procedure Laterality Date  . Appendectomy    . Patent ductus arterious repair      at age 84  . Colonoscopy w/ polypectomy  03/17/11    diminutive polyp  . Cardiac catheterization  2007    Clean Cardiac Cath [facility] Cards), with RCA 30% narrowing likely due to catheter induced spasm in 09/02/10.  . Back surgery    . Cardiac catheterization  09/02/2010    mod. nonobstructive disease in the RCA and CX, tortuous LAD  . Nm myocar perf wall motion  01/25/2008    mild anteroapical wall ischemia  . Loop recorder implant  04/06/2012    Reveal XT 4529   Past Medical History  Diagnosis Date  . Diabetes mellitus   . Asthma   . Enlarged prostate   . Hyperlipidemia   . Heart disease   . Hypertension   . Blood transfusion   . GERD (gastroesophageal reflux disease)   . Refusal of blood transfusions as patient is Jehovah's Witness   . Neuromuscular disorder  DJD  . Coronary artery disease   . CAD (coronary artery disease), non obstructive on cath 2011 04/05/2012  . H/O syncope    BP 114/70  Pulse 91  Resp 14  Ht 5\' 8"  (1.727 m)  Wt 223 lb 9.6 oz (101.424 kg)  BMI 34.01 kg/m2  SpO2 98%  Opioid Risk Score:   Fall Risk Score: Moderate Fall Risk (6-13 points) (education and handout on fall prevention given at previous visit)  Review of Systems  Constitutional: Positive for diaphoresis, appetite change and unexpected weight change.  Respiratory: Positive for shortness of breath and wheezing.   Gastrointestinal: Positive for nausea and abdominal pain.  Genitourinary: Positive for difficulty urinating.  Musculoskeletal: Positive for back pain and gait problem.  Psychiatric/Behavioral: Positive for dysphoric mood. The patient is nervous/anxious.   All other systems  reviewed and are negative.      Objective:   Physical Exam  Right lower extremity no pain with hip knee or ankle range of motion. No pain over the Achilles tendon  Tenderness or swelling  No evidence of right knee effusion.  Back has mild tenderness palpation paraspinal muscles right side greater than left side L4 and L5 area  Ambulates with a cane but no evidence of toe drag or knee instability       Assessment & Plan:  1. Lumbar postlaminectomy syndrome with chronic right L4 radiculopathy We discussed treatment options particularly in regards to his right lower extremity pain. We'll increase gabapentin 300 mg in the morning 300 mg in the afternoon and 900 mg at night Increased Ultracet 1 tablet 4 times per day further increase is not recommended secondary to his Zoloft as well as trazodone which may interact and cause serotonin syndrome

## 2014-01-08 LAB — CBC
HCT: 40.5 % (ref 39.0–52.0)
HEMOGLOBIN: 14.1 g/dL (ref 13.0–17.0)
MCH: 26.4 pg (ref 26.0–34.0)
MCHC: 34.8 g/dL (ref 30.0–36.0)
MCV: 75.8 fL — ABNORMAL LOW (ref 78.0–100.0)
PLATELETS: 262 10*3/uL (ref 150–400)
RBC: 5.34 MIL/uL (ref 4.22–5.81)
RDW: 19 % — ABNORMAL HIGH (ref 11.5–15.5)
WBC: 4.8 10*3/uL (ref 4.0–10.5)

## 2014-01-08 LAB — COMPREHENSIVE METABOLIC PANEL
ALT: 37 U/L (ref 0–53)
AST: 33 U/L (ref 0–37)
Albumin: 4.4 g/dL (ref 3.5–5.2)
Alkaline Phosphatase: 106 U/L (ref 39–117)
BILIRUBIN TOTAL: 1.2 mg/dL (ref 0.2–1.2)
BUN: 17 mg/dL (ref 6–23)
CALCIUM: 9.3 mg/dL (ref 8.4–10.5)
CHLORIDE: 99 meq/L (ref 96–112)
CO2: 27 meq/L (ref 19–32)
Creat: 0.87 mg/dL (ref 0.50–1.35)
Glucose, Bld: 138 mg/dL — ABNORMAL HIGH (ref 70–99)
Potassium: 4.2 mEq/L (ref 3.5–5.3)
Sodium: 136 mEq/L (ref 135–145)
Total Protein: 7.2 g/dL (ref 6.0–8.3)

## 2014-01-08 LAB — TSH: TSH: 1.423 u[IU]/mL (ref 0.350–4.500)

## 2014-01-10 ENCOUNTER — Encounter (HOSPITAL_COMMUNITY): Payer: Self-pay | Admitting: Pharmacy Technician

## 2014-01-13 ENCOUNTER — Encounter (HOSPITAL_COMMUNITY): Payer: Self-pay | Admitting: Pharmacy Technician

## 2014-01-13 ENCOUNTER — Other Ambulatory Visit: Payer: Self-pay | Admitting: Cardiovascular Disease

## 2014-01-14 ENCOUNTER — Ambulatory Visit (HOSPITAL_COMMUNITY)
Admission: RE | Admit: 2014-01-14 | Discharge: 2014-01-14 | Disposition: A | Discharge status: Home / Self Care | Payer: Managed Care, Other (non HMO) | Source: Ambulatory Visit | Attending: Cardiovascular Disease | Admitting: Cardiovascular Disease

## 2014-01-14 ENCOUNTER — Encounter (HOSPITAL_COMMUNITY)
Admission: RE | Disposition: A | Discharge status: Home / Self Care | Payer: Self-pay | Source: Ambulatory Visit | Attending: Cardiovascular Disease

## 2014-01-14 DIAGNOSIS — K219 Gastro-esophageal reflux disease without esophagitis: Secondary | ICD-10-CM | POA: Insufficient documentation

## 2014-01-14 DIAGNOSIS — E119 Type 2 diabetes mellitus without complications: Secondary | ICD-10-CM | POA: Insufficient documentation

## 2014-01-14 DIAGNOSIS — R55 Syncope and collapse: Secondary | ICD-10-CM

## 2014-01-14 DIAGNOSIS — N400 Enlarged prostate without lower urinary tract symptoms: Secondary | ICD-10-CM | POA: Insufficient documentation

## 2014-01-14 DIAGNOSIS — Z4509 Encounter for adjustment and management of other cardiac device: Secondary | ICD-10-CM | POA: Insufficient documentation

## 2014-01-14 DIAGNOSIS — J45909 Unspecified asthma, uncomplicated: Secondary | ICD-10-CM | POA: Insufficient documentation

## 2014-01-14 DIAGNOSIS — I2510 Atherosclerotic heart disease of native coronary artery without angina pectoris: Secondary | ICD-10-CM | POA: Insufficient documentation

## 2014-01-14 DIAGNOSIS — I10 Essential (primary) hypertension: Secondary | ICD-10-CM | POA: Insufficient documentation

## 2014-01-14 DIAGNOSIS — E781 Pure hyperglyceridemia: Secondary | ICD-10-CM | POA: Insufficient documentation

## 2014-01-14 DIAGNOSIS — M1990 Unspecified osteoarthritis, unspecified site: Secondary | ICD-10-CM | POA: Insufficient documentation

## 2014-01-14 HISTORY — PX: LOOP RECORDER EXPLANT: SHX5476

## 2014-01-14 SURGERY — LOOP RECORDER EXPLANT
Anesthesia: LOCAL

## 2014-01-14 MED ORDER — CEFAZOLIN SODIUM-DEXTROSE 2-3 GM-% IV SOLR: 2.0000 g | INTRAVENOUS | Status: DC

## 2014-01-14 MED ORDER — SODIUM CHLORIDE 0.9 % IR SOLN
80.0000 mg | Status: DC
  Filled 2014-01-14: qty 2

## 2014-01-14 MED ORDER — SODIUM CHLORIDE 0.9 % IV SOLN
INTRAVENOUS | Status: DC
  Administered 2014-01-14: 14:00:00 via INTRAVENOUS

## 2014-01-14 MED ORDER — HEPARIN (PORCINE) IN NACL 2-0.9 UNIT/ML-% IJ SOLN
INTRAMUSCULAR | Status: AC
  Filled 2014-01-14: qty 1000

## 2014-01-14 MED ORDER — LIDOCAINE HCL (PF) 1 % IJ SOLN
INTRAMUSCULAR | Status: AC
  Filled 2014-01-14: qty 60

## 2014-01-14 NOTE — H&P (Signed)
Cody Raymond is an 43 y.o. male.    Chief Complaint: loop recorder explantation  HPI: He had a loop recorder that was implanted almost 3 years ago for an episode of syncope this sounded possibly arrhythmogenic in the setting of a right bundle branch block. No arrhythmia has been detected by the device in almost 3 years. He finds the site of the loop recorder uncomfortable and would like it removed when the battery reaches ERI. He has reactive airway disease and takes albuterol. He is on a statin for hyperlipidemia but has a residual hypertriglyceridemia and has controlled diabetes mellitus.  A recent hemoglobin A1c was 6.2% (he takes metformin monotherapy for diabetes) total cholesterol is 229 crit was 2681 HDL 37.  At age 18 he underwent surgical repair of a patent ductus arteriosus. In 2011 he underwent cardiac catheterization which showed very minor coronary atherosclerosis, similar to 2009. This was done for a "false positive" nuclear stress test and chest pain.   Past Medical History  Diagnosis Date  . Diabetes mellitus   . Asthma   . Enlarged prostate   . Hyperlipidemia   . Heart disease   . Hypertension   . Blood transfusion   . GERD (gastroesophageal reflux disease)   . Refusal of blood transfusions as patient is Jehovah's Witness   . Neuromuscular disorder     DJD  . Coronary artery disease   . CAD (coronary artery disease), non obstructive on cath 2011 04/05/2012  . H/O syncope     Past Surgical History  Procedure Laterality Date  . Appendectomy    . Patent ductus arterious repair      at age 8  . Colonoscopy w/ polypectomy  03/17/11    diminutive polyp  . Cardiac catheterization  2007    Clean Cardiac Cath [facility] Cards), with RCA 30% narrowing likely due to catheter induced spasm in 09/02/10.  . Back surgery    . Cardiac catheterization  09/02/2010    mod. nonobstructive disease in the RCA and CX, tortuous LAD  . Nm myocar perf wall motion  01/25/2008    mild  anteroapical wall ischemia  . Loop recorder implant  04/06/2012    Reveal XT 4529    Family History  Problem Relation Age of Onset  . Colon cancer Paternal Grandfather   . Prostate cancer Paternal Grandfather   . Aneurysm Father   . Heart attack Father 3  . Coronary artery disease Father   . Colon cancer Paternal Uncle     x 2  . Colon polyps Mother   . Heart disease Mother   . Coronary artery disease Mother   . Breast cancer Maternal Grandmother   . Stomach cancer Brother   . Liver disease      unsure who it was   Social History:  reports that he has never smoked. He has never used smokeless tobacco. He reports that he drinks alcohol. He reports that he does not use illicit drugs.  Allergies:  Allergies  Allergen Reactions  . Benadryl [Diphenhydramine Hcl]     "drives me nuts"  . Red Blood Cells     PT. REFUSES ANY BLOOD PRODUCTS - JEHOVAH'S WITNESS.    ROS The patient specifically denies any chest pain with exertion, dyspnea with exertion, orthopnea, paroxysmal nocturnal dyspnea, syncope, palpitations, focal neurological deficits, intermittent claudication, lower extremity edema, unexplained weight gain, cough, hemoptysis or wheezing.  The patient also denies abdominal pain, nausea, vomiting, dysphagia, diarrhea, constipation, polyuria, polydipsia, dysuria, hematuria,  frequency, urgency, abnormal bleeding or bruising, fever, chills, unexpected weight changes, mood swings, change in skin or hair texture, change in voice quality, auditory or visual problems, allergic reactions or rashes, new musculoskeletal complaints other than usual "aches and pains".  There were no vitals taken for this visit. Physical Exam  General: Alert, oriented x3, no distress  Head: no evidence of trauma, PERRL, EOMI, no exophtalmos or lid lag, no myxedema, no xanthelasma; normal ears, nose and oropharynx  Neck: normal jugular venous pulsations and no hepatojugular reflux; brisk carotid pulses  without delay and no carotid bruits  Chest: clear to auscultation, no signs of consolidation by percussion or palpation, normal fremitus, symmetrical and full respiratory excursions  Cardiovascular: normal position and quality of the apical impulse, regular rhythm, normal first and second heart sounds, no murmurs, rubs or gallops  Abdomen: no tenderness or distention, no masses by palpation, no abnormal pulsatility or arterial bruits, normal bowel sounds, no hepatosplenomegaly  Extremities: no clubbing, cyanosis or edema; 2+ radial, ulnar and brachial pulses bilaterally; 2+ right femoral, posterior tibial and dorsalis pedis pulses; 2+ left femoral, posterior tibial and dorsalis pedis pulses; no subclavian or femoral bruits  Neurological: grossly nonfocal  Assessment/Plan ILR explantation for device at [employer] 01/14/2014, 12:44 PM

## 2014-01-14 NOTE — Discharge Instructions (Signed)
Wound Care Wound care helps prevent pain and infection.  [place]   Only take medicine as told by your doctor.  Clean the wound daily with mild soap and water.  Change any bandages (dressings) as told by your doctor.  Take showers. Do not take baths, swim, or do anything that puts your wound under water.  Keep all doctor visits as told. GET HELP RIGHT AWAY IF:   Yellowish-white fluid (pus) comes from the wound.  Medicine does not lessen your pain.  There is a red streak going away from the wound.  You have a fever. MAKE SURE YOU:   Understand these instructions.  Will watch your condition.  Will get help right away if you are not doing well or get worse. Document Released: 06/14/2008 Document Revised: 11/28/2011 Document Reviewed: 01/09/2011 [facility] Patient Information 2014 [place], [place].

## 2014-01-14 NOTE — CV Procedure (Addendum)
LOOP RECORDER EXPLANT   Procedure report  Procedure performed:  1. Loop recorder explantation  Reason for procedure:  1. Device at end of service Procedure performed by:  Sanda Klein, MD  Complications:  None  Estimated blood loss:  <5 mL  Medications administered during procedure:  Ancef 2 g intravenously, lidocaine 1% 30 mL locally,  Device details:  [employer]onic  model number [id number], serial number [id number]   Procedure details:  After the risks and benefits of the procedure were discussed the patient provided informed consent. He was brought to the cardiac catheterization lab. The patient was prepped and draped in usual sterile fashion. Local anesthesia with 1% lidocaine was administered to to the area of the implantation scar. A 3 cm horizontal incision was made. Using electrocautery and mostly sharp and blunt dissection the pocket was opened and the device was explanted. The pocket was flushed with copious amounts of antibiotic solution. Hemostasis was ensured. The pocket was then closed in layers using 2 layers of 2-0 Vicryl and cutaneous steristrips after which a sterile dressing was applied.   Sanda Klein, MD, [facility] [phone] office [phone] pager

## 2014-01-21 ENCOUNTER — Other Ambulatory Visit: Payer: Self-pay

## 2014-01-21 ENCOUNTER — Ambulatory Visit (INDEPENDENT_AMBULATORY_CARE_PROVIDER_SITE_OTHER): Payer: Managed Care, Other (non HMO)

## 2014-01-21 VITALS — BP 106/66 | HR 85 | Resp 16 | Ht 68.0 in | Wt 220.0 lb

## 2014-01-21 DIAGNOSIS — M79673 Pain in unspecified foot: Secondary | ICD-10-CM

## 2014-01-21 DIAGNOSIS — M7660 Achilles tendinitis, unspecified leg: Secondary | ICD-10-CM

## 2014-01-21 DIAGNOSIS — M21379 Foot drop, unspecified foot: Secondary | ICD-10-CM

## 2014-01-21 DIAGNOSIS — R269 Unspecified abnormalities of gait and mobility: Secondary | ICD-10-CM

## 2014-01-21 DIAGNOSIS — M79609 Pain in unspecified limb: Secondary | ICD-10-CM

## 2014-01-21 DIAGNOSIS — M1990 Unspecified osteoarthritis, unspecified site: Secondary | ICD-10-CM

## 2014-01-21 DIAGNOSIS — M779 Enthesopathy, unspecified: Secondary | ICD-10-CM

## 2014-01-21 DIAGNOSIS — M722 Plantar fascial fibromatosis: Secondary | ICD-10-CM

## 2014-01-21 DIAGNOSIS — M216X9 Other acquired deformities of unspecified foot: Secondary | ICD-10-CM

## 2014-01-21 NOTE — Progress Notes (Signed)
Subjective:    Patient ID: Cody Raymond, male    DOB: March 20, 1971, 43 y.o.   MRN: [id number]  HPI Comments: "I have pain in the achilles"  Patient c/o aching posterior heel right for several months. Walking long periods makes worse. The area gets swollen. He saw PCP and he gave injection and recommended inserts.   Also, c/o burning, sharp sensation 1st toe and MPJ left for several months. States he does have back problems. Gets swollen sometimes. Limited ROM.     Review of Systems  Respiratory: Positive for wheezing.   Cardiovascular:       Calf pain with walking   Endocrine: Positive for polyphagia and polyuria.  Musculoskeletal: Positive for arthralgias, back pain, gait problem and myalgias.  Neurological: Positive for dizziness, weakness, light-headedness and headaches.  Psychiatric/Behavioral: The patient is nervous/anxious.   All other systems reviewed and are negative.      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 Neurovascular status is intact with pedal pulses palpable DP postal for PT one over 4 bilateral some decreased hair growth is noted at skin texture turgor normal capillary refill 3 seconds all digits neurologically epicritic and proprioceptive sensations appear to be intact and symmetric bilateral the may have some hyperesthesia to diabetic neuropathy and some abnormality in motor  function strength on the right side due to his previous surgery and back injury. Muscle strength testing left side is normal with. Excellent or flexion plantar flexion of her right side he is unable to resist for her do active passive range of motion suggest any resistance whatsoever passively can move from however no can overcome is holding his foot in a rectus position he cannot bend or flex or dorsiflex or plantarflexed foot has abnormality gait is actually didn't fall as a result of this weakness to there's some tenderness on palpation of the Achilles tendon x-rays reveal rectus foot type mild inferior retrocalcaneal spurs are noted the left hallux is unremarkable no signs of fracture or osseous abnormalities either soft tissue cartilaginous in nature. There is also tenderness along the plantar fascia bilateral with weightbearing and ambulation x-rays confirm a pronated foot type or pedis planus type foot type bilateral.       Assessment & Plan:  Assessments at this time #1 Achilles tendinitis secondary to gait abnormality and dropfoot deformity right foot. #2 is capsulitis and posture arthropathy hallux IP joint left foot possible history of injury or contusion some point in the past. #3 is plantar fascial symptomology bilaterally appropriate foot changes. Plan at this time patient is a candidate for AFO brace on the right prescription for biotech is given for an AFO brace on the right foot in the conventional orthoses for the left foot for plantar fascial symptomology. At this time also some Coflex wrap is dispensed the patient for wrapping his left hallux to help stabilize the IP joint. 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

## 2014-01-21 NOTE — Patient Instructions (Signed)
Diabetes and Foot Care Diabetes may cause you to have problems because of poor blood supply (circulation) to your feet and legs. This may cause the skin on your feet to become thinner, break easier, and heal more slowly. Your skin may become dry, and the skin may peel and crack. You may also have nerve damage in your legs and feet causing decreased feeling in them. You may not notice minor injuries to your feet that could lead to infections or more serious problems. Taking care of your feet is one of the most important things you can do for yourself.  HOME CARE INSTRUCTIONS  Wear shoes at all times, even in the house. Do not go barefoot. Bare feet are easily injured.  Check your feet daily for blisters, cuts, and redness. If you cannot see the bottom of your feet, use a mirror or ask someone for help.  Wash your feet with warm water (do not use hot water) and mild soap. Then pat your feet and the areas between your toes until they are completely dry. Do not soak your feet as this can dry your skin.  Apply a moisturizing lotion or petroleum jelly (that does not contain alcohol and is unscented) to the skin on your feet and to dry, brittle toenails. Do not apply lotion between your toes.  Trim your toenails straight across. Do not dig under them or around the cuticle. File the edges of your nails with an emery board or nail file.  Do not cut corns or calluses or try to remove them with medicine.  Wear clean socks or stockings every day. Make sure they are not too tight. Do not wear knee-high stockings since they may decrease blood flow to your legs.  Wear shoes that fit properly and have enough cushioning. To break in new shoes, wear them for just a few hours a day. This prevents you from injuring your feet. Always look in your shoes before you put them on to be sure there are no objects inside.  Do not cross your legs. This may decrease the blood flow to your feet.  If you find a minor scrape,  cut, or break in the skin on your feet, keep it and the skin around it clean and dry. These areas may be cleansed with mild soap and water. Do not cleanse the area with peroxide, alcohol, or iodine.  When you remove an adhesive bandage, be sure not to damage the skin around it.  If you have a wound, look at it several times a day to make sure it is healing.  Do not use heating pads or hot water bottles. They may burn your skin. If you have lost feeling in your feet or legs, you may not know it is happening until it is too late.  Make sure your health care provider performs a complete foot exam at least annually or more often if you have foot problems. Report any cuts, sores, or bruises to your health care provider immediately. SEEK MEDICAL CARE IF:   You have an injury that is not healing.  You have cuts or breaks in the skin.  You have an ingrown nail.  You notice redness on your legs or feet.  You feel burning or tingling in your legs or feet.  You have pain or cramps in your legs and feet.  Your legs or feet are numb.  Your feet always feel cold. SEEK IMMEDIATE MEDICAL CARE IF:   There is increasing redness,   swelling, or pain in or around a wound.  There is a red line that goes up your leg.  Pus is coming from a wound.  You develop a fever or as directed by your health care provider.  You notice a bad smell coming from an ulcer or wound. Document Released: 09/02/2000 Document Revised: 05/08/2013 Document Reviewed: 02/12/2013 ExitCare Patient Information 2014 [employer].  

## 2014-01-23 ENCOUNTER — Ambulatory Visit (INDEPENDENT_AMBULATORY_CARE_PROVIDER_SITE_OTHER): Payer: Managed Care, Other (non HMO) | Admitting: *Deleted

## 2014-01-23 DIAGNOSIS — R55 Syncope and collapse: Secondary | ICD-10-CM

## 2014-01-23 NOTE — Progress Notes (Signed)
Wound check s/p ILR explant. Wound without redness or edema. Incision edges well healed. Pt education completed including recognition of infection signs. Patient to F/U with [facility] in 04-2014.

## 2014-02-18 ENCOUNTER — Telehealth: Payer: Self-pay | Admitting: *Deleted

## 2014-02-18 DIAGNOSIS — R718 Other abnormality of red blood cells: Secondary | ICD-10-CM

## 2014-02-18 NOTE — Telephone Encounter (Signed)
Left message for pt to return my call.

## 2014-02-18 NOTE — Telephone Encounter (Signed)
I don't think it is likely to indicate cancer.  More likely vitamin deficiency.  We can check b12, folate and iron levels pended below.

## 2014-02-18 NOTE — Telephone Encounter (Signed)
Pt left message that he has noticed his RDW has been elevated for the last 3 years. He states he has been having some dizziness episodes. Is also concerned if the elevated labs could indicate cancer?  Please advise.

## 2014-02-18 NOTE — Telephone Encounter (Signed)
Notified pt. He is still concerned about possibility of underlying cancer. He will proceed with vitamin levels first. Orders signed.

## 2014-02-20 LAB — IRON AND TIBC
%SAT: 35 % (ref 20–55)
IRON: 110 ug/dL (ref 42–165)
TIBC: 310 ug/dL (ref 215–435)
UIBC: 200 ug/dL (ref 125–400)

## 2014-02-21 ENCOUNTER — Encounter: Payer: Self-pay | Admitting: Family

## 2014-02-21 LAB — FOLATE: Folate: >20 ng/mL

## 2014-02-21 LAB — VITAMIN B12: Vitamin B-12: 485 pg/mL (ref 211–911)

## 2014-03-24 ENCOUNTER — Ambulatory Visit: Payer: Self-pay | Admitting: Physical Medicine & Rehabilitation

## 2014-03-24 ENCOUNTER — Encounter: Payer: Managed Care, Other (non HMO) | Admitting: Registered Nurse

## 2014-04-02 ENCOUNTER — Telehealth: Payer: Self-pay

## 2014-04-02 DIAGNOSIS — E781 Pure hyperglyceridemia: Secondary | ICD-10-CM

## 2014-04-02 NOTE — Telephone Encounter (Signed)
Diabetic bundle:  mychart message sent- needs LDL labs drawn  Order placed

## 2014-04-03 ENCOUNTER — Other Ambulatory Visit: Payer: Self-pay | Admitting: Family

## 2014-04-23 ENCOUNTER — Ambulatory Visit: Payer: Managed Care, Other (non HMO)

## 2014-04-24 ENCOUNTER — Ambulatory Visit: Payer: Self-pay | Admitting: Cardiovascular Disease

## 2014-05-20 ENCOUNTER — Encounter: Payer: Managed Care, Other (non HMO) | Attending: Physical Medicine & Rehabilitation

## 2014-05-20 ENCOUNTER — Ambulatory Visit (HOSPITAL_BASED_OUTPATIENT_CLINIC_OR_DEPARTMENT_OTHER): Payer: Managed Care, Other (non HMO) | Admitting: Physical Medicine & Rehabilitation

## 2014-05-20 ENCOUNTER — Encounter: Payer: Self-pay | Admitting: Physical Medicine & Rehabilitation

## 2014-05-20 VITALS — BP 116/68 | HR 83 | Resp 16 | Ht 68.0 in | Wt 216.0 lb

## 2014-05-20 DIAGNOSIS — M961 Postlaminectomy syndrome, not elsewhere classified: Secondary | ICD-10-CM | POA: Insufficient documentation

## 2014-05-20 DIAGNOSIS — IMO0002 Reserved for concepts with insufficient information to code with codable children: Secondary | ICD-10-CM | POA: Diagnosis not present

## 2014-05-20 DIAGNOSIS — M7660 Achilles tendinitis, unspecified leg: Secondary | ICD-10-CM | POA: Diagnosis not present

## 2014-05-20 DIAGNOSIS — I10 Essential (primary) hypertension: Secondary | ICD-10-CM | POA: Diagnosis not present

## 2014-05-20 DIAGNOSIS — M7661 Achilles tendinitis, right leg: Secondary | ICD-10-CM

## 2014-05-20 DIAGNOSIS — E119 Type 2 diabetes mellitus without complications: Secondary | ICD-10-CM | POA: Diagnosis not present

## 2014-05-20 DIAGNOSIS — M5416 Radiculopathy, lumbar region: Secondary | ICD-10-CM

## 2014-05-20 MED ORDER — METHOCARBAMOL 500 MG PO TABS: 500.0000 mg | ORAL_TABLET | Freq: Every evening | ORAL | Status: DC | PRN

## 2014-05-20 MED ORDER — TRAMADOL-ACETAMINOPHEN 37.5-325 MG PO TABS: 1.0000 | ORAL_TABLET | Freq: Three times a day (TID) | ORAL | Status: DC | PRN

## 2014-05-20 NOTE — Patient Instructions (Signed)
Joint Injection  Care After  Refer to this sheet in the next few days. These instructions provide you with information on caring for yourself after you have had a joint injection. Your caregiver also may give you more specific instructions. Your treatment has been planned according to current medical practices, but problems sometimes occur. Call your caregiver if you have any problems or questions after your procedure.  After any type of joint injection, it is not uncommon to experience:  · Soreness, swelling, or bruising around the injection site.  · Mild numbness, tingling, or weakness around the injection site caused by the numbing medicine used before or with the injection.  It also is possible to experience the following effects associated with the specific agent after injection:  · Iodine-based contrast agents:  ¨ Allergic reaction (itching, hives, widespread redness, and swelling beyond the injection site).  · Corticosteroids (These effects are rare.):  ¨ Allergic reaction.  ¨ Increased blood sugar levels (If you have diabetes and you notice that your blood sugar levels have increased, notify your caregiver).  ¨ Increased blood pressure levels.  ¨ Mood swings.  · Hyaluronic acid in the use of viscosupplementation.  ¨ Temporary heat or redness.  ¨ Temporary rash and itching.  ¨ Increased fluid accumulation in the injected joint.  These effects all should resolve within a day after your procedure.   HOME CARE INSTRUCTIONS  · Limit yourself to light activity the day of your procedure. Avoid lifting heavy objects, bending, stooping, or twisting.  · Take prescription or over-the-counter pain medication as directed by your caregiver.  · You may apply ice to your injection site to reduce pain and swelling the day of your procedure. Ice may be applied 03-04 times:  ¨ Put ice in a plastic bag.  ¨ Place a towel between your skin and the bag.  ¨ Leave the ice on for no longer than 15-20 minutes each time.  SEEK  IMMEDIATE MEDICAL CARE IF:   · Pain and swelling get worse rather than better or extend beyond the injection site.  · Numbness does not go away.  · Blood or fluid continues to leak from the injection site.  · You have chest pain.  · You have swelling of your face or tongue.  · You have trouble breathing or you become dizzy.  · You develop a fever, chills, or severe tenderness at the injection site that last longer than 1 day.  MAKE SURE YOU:  · Understand these instructions.  · Watch your condition.  · Get help right away if you are not doing well or if you get worse.  Document Released: 05/19/2011 Document Revised: 11/28/2011 Document Reviewed: 05/19/2011  ExitCare® Patient Information ©2015 [employer]. This information is not intended to replace advice given to you by your health care provider. Make sure you discuss any questions you have with your health care provider.

## 2014-05-20 NOTE — Progress Notes (Addendum)
Subjective:    Patient ID: Cody Raymond, male    DOB: 1971-05-22, 43 y.o.   MRN: [id number]  HPI  Seen by podiatrist for achilles heel, recommend custom bracing Brace is costing ~500.00, patient will need to hold off on this until he gets disability Gets 4-5 hr sleep per night, pain is not the limiting factor. Previous workup for sleep apnea which was negative for about 3 or 4 years ago. He has lost weight since that time.  Patient has applied for disability Left hip pain with difficulty sleeping on the left side  Patient currently not exercising we discussed water walking. He does have access to a poOL Pain Inventory  Average Pain 7 Pain Right Now 7 My pain is sharp, burning, dull, stabbing, tingling and aching  In the last 24 hours, has pain interfered with the following? General activity 5 Relation with others 5 Enjoyment of life 6 What TIME of day is your pain at its worst? constant all day Sleep (in general) Poor  Pain is worse with: walking, bending, sitting and standing Pain improves with: rest and medication Relief from Meds: 4  Mobility walk with assistance use a cane how many minutes can you walk? 15 ability to climb steps?  no do you drive?  yes  Function not employed: date last employed 2012 I need assistance with the following:  bathing  Neuro/Psych weakness numbness tingling dizziness confusion depression anxiety  Prior Studies Any changes since last visit?  yes x-rays CT/MRI nerve study  Physicians involved in your care Any changes since last visit?  no   Family History  Problem Relation Age of Onset  . Colon cancer Paternal Grandfather   . Prostate cancer Paternal Grandfather   . Aneurysm Father   . Heart attack Father 86  . Coronary artery disease Father   . Colon cancer Paternal Uncle     x 2  . Colon polyps Mother   . Heart disease Mother   . Coronary artery disease Mother   . Breast cancer Maternal Grandmother   .  Stomach cancer Brother   . Liver disease      unsure who it was   History   Social History  . Marital Status: Married    Spouse Name: N/A    Number of Children: N/A  . Years of Education: N/A   Social History Main Topics  . Smoking status: Never Smoker   . Smokeless tobacco: Never Used  . Alcohol Use: Yes     Comment: rare  . Drug Use: No  . Sexual Activity: None   Other Topics Concern  . None   Social History Narrative   Holter monitor 08/2010: PVCs and sinus tachy.   Sleep Study (02/2008): mild sleep apnea, no indication for CPAP.   Past Surgical History  Procedure Laterality Date  . Appendectomy    . Patent ductus arterious repair      at age 4  . Colonoscopy w/ polypectomy  03/17/11    diminutive polyp  . Cardiac catheterization  2007    Clean Cardiac Cath [facility]), with RCA 30% narrowing likely due to catheter induced spasm in 09/02/10.  . Back surgery    . Cardiac catheterization  09/02/2010    mod. nonobstructive disease in the RCA and CX, tortuous LAD  . Nm myocar perf wall motion  01/25/2008    mild anteroapical wall ischemia  . Loop recorder implant  04/06/2012    Reveal XT 4529  Past Medical History  Diagnosis Date  . Diabetes mellitus   . Asthma   . Enlarged prostate   . Hyperlipidemia   . Heart disease   . Hypertension   . Blood transfusion   . GERD (gastroesophageal reflux disease)   . Refusal of blood transfusions as patient is Jehovah's Witness   . Neuromuscular disorder     DJD  . Coronary artery disease   . CAD (coronary artery disease), non obstructive on cath 2011 04/05/2012  . H/O syncope    BP 116/68  Pulse 83  Resp 16  Ht 5\' 8"  (1.727 m)  Wt 216 lb (97.977 kg)  BMI 32.85 kg/m2  Opioid Risk Score:   Fall Risk Score:      Review of Systems  Constitutional: Positive for diaphoresis.  Respiratory: Positive for cough, shortness of breath and wheezing.   Gastrointestinal: Positive for nausea and abdominal pain.    Endocrine:       High blood sugar  Neurological: Positive for dizziness, weakness and numbness.       Tingling  Psychiatric/Behavioral: Positive for confusion. The patient is nervous/anxious.        Depression  All other systems reviewed and are negative.      Objective:   Physical Exam  Nursing note and vitals reviewed. Constitutional: He is oriented to person, place, and time. He appears well-developed and well-nourished.  HENT:  Head: Normocephalic and atraumatic.  Eyes: Conjunctivae and EOM are normal. Pupils are equal, round, and reactive to light.  Neck: Normal range of motion.  Musculoskeletal:       Right ankle: Achilles tendon exhibits pain. Achilles tendon exhibits no defect and normal Thompson's test results.       Left ankle: Achilles tendon exhibits pain. Achilles tendon exhibits no defect and normal Thompson's test results.       Right foot: He exhibits deformity. He exhibits normal range of motion and no tenderness.       Left foot: He exhibits deformity. He exhibits normal range of motion and no tenderness.  Negative straight leg raising test  Pes planus bilateral feet  Neurological: He is alert and oriented to person, place, and time.  Psychiatric: He has a normal mood and affect.    Tenderness to palpation over the left greater trochanter of the hip      Assessment & Plan:    1. Lumbar postlaminectomy syndrome with chronic right L4 radiculopathy. Continue gabapentin Continue Ultracet 1 tablet every 4 hours when necessary Has poor sleep secondary to multiple factors. We'll trial methocarbamol at night Water walking in the pool  2. Left trochanteric bursitis Trochanteric bursa injection  without ultrasound guidance  Indication Trochanteric bursitis. Exam has tenderness over the greater trochanter of the hip. Pain has not responded to conservative care such as exercise therapy and oral medications. Pain interferes with sleep or with mobility Informed  consent was obtained after describing risks and benefits of the procedure with the patient these include bleeding bruising and infection. Patient has signed written consent form. Patient placed in a lateral decubitus position with the affected hip superior. Point of maximal pain was palpated marked and prepped with Betadine and entered with a needle to bone contact. Needle slightly withdrawn then 6mg  of betamethasone with 4 cc 1% lidocaine were injected. Patient tolerated procedure well. Post procedure instructions given.

## 2014-05-20 NOTE — Progress Notes (Signed)
   Subjective:    Patient ID: Cody Raymond, male    DOB: 1971-09-05, 43 y.o.   MRN: [id number]  HPI    Review of Systems     Objective:   Physical Exam        Assessment & Plan:

## 2014-05-23 ENCOUNTER — Encounter: Payer: Self-pay | Admitting: Cardiovascular Disease

## 2014-05-23 ENCOUNTER — Ambulatory Visit (INDEPENDENT_AMBULATORY_CARE_PROVIDER_SITE_OTHER): Payer: Managed Care, Other (non HMO) | Admitting: Cardiovascular Disease

## 2014-05-23 VITALS — BP 110/74 | HR 73 | Resp 16 | Ht 68.0 in | Wt 213.0 lb

## 2014-05-23 DIAGNOSIS — R079 Chest pain, unspecified: Secondary | ICD-10-CM

## 2014-05-23 DIAGNOSIS — E782 Mixed hyperlipidemia: Secondary | ICD-10-CM

## 2014-05-23 DIAGNOSIS — Z79899 Other long term (current) drug therapy: Secondary | ICD-10-CM

## 2014-05-23 DIAGNOSIS — R002 Palpitations: Secondary | ICD-10-CM

## 2014-05-23 NOTE — Patient Instructions (Signed)
STOP Metoprolol.  Dr. Sallyanne Kuster recommends that you schedule a follow-up appointment in: One Year.

## 2014-05-23 NOTE — Progress Notes (Signed)
Patient ID: Cody Raymond, male   DOB: 07/24/1971, 43 y.o.   MRN: [id number]      Reason for office visit Chest pain, mixed hyperlipidemia, mild coronary atherosclerosis  Cody Raymond returns for routine followup. He has not had syncopal events. He has occasional episodes of chest discomfort at rest located in his upper left chest, promptly responsive to nitroglycerin. They're not associated with physical activity. There have been no more than once a month, sometimes less than that. His major complaint today is positional dizziness. 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 He is using a cane to walk. He is waiting to hear the results of his disability hearing.  He has moderately severe hypertriglyceridemia despite treatment with fibrates and what he reports as good control of his type 2 diabetes mellitus. He is mildly obese. He has bronchial asthma. He takes Flomax for an enlarged prostate.  At age 45 and 1 surgical repair of a patent ductus arteriosus. He has had 2 cardiac catheterizations in 2009 and 2011 each showing very mild coronary atherosclerosis (preceded by "false positive" nuclear stress test). Several years ago he had a single episode of syncope of uncertain etiology. He had a loop recorder for 3 years it never showed any meaningful arrhythmia.     Allergies  Allergen Reactions  . Benadryl [Diphenhydramine Hcl]     "drives me nuts"  . Red Blood Cells     PT. REFUSES ANY BLOOD PRODUCTS - JEHOVAH'S WITNESS.    Current Outpatient Prescriptions  Medication Sig Dispense Refill  . albuterol (PROVENTIL HFA;VENTOLIN HFA) 108 (90 BASE) MCG/ACT inhaler Inhale 2 puffs into the lungs every 6 (six) hours as needed for wheezing or shortness of breath. Use with spacer, for asthma      . albuterol (PROVENTIL) (2.5 MG/3ML) 0.083% nebulizer solution Take 2.5 mg by nebulization every 6 (six) hours as needed for wheezing or shortness of breath. For  wheezing      . aspirin 325 MG tablet Take 325 mg by mouth daily.      Marland Kitchen atorvastatin (LIPITOR) 80 MG tablet Take 80 mg by mouth daily.      . betamethasone dipropionate (DIPROLENE) 0.05 % cream Apply 1 application topically every evening.      . budesonide-formoterol (SYMBICORT) 160-4.5 MCG/ACT inhaler Inhale 2 puffs into the lungs 2 (two) times daily.      . cetirizine (ZYRTEC) 10 MG tablet Take 10 mg by mouth daily.      . clonazePAM (KLONOPIN) 1 MG tablet Take 1 mg by mouth daily.       . diclofenac sodium (VOLTAREN) 1 % GEL Apply 2 g topically 4 (four) times daily.      . fenofibrate (TRICOR) 145 MG tablet Take 145 mg by mouth daily.      . fish oil-omega-3 fatty acids 1000 MG capsule Take 2 g by mouth 2 (two) times daily.       Marland Kitchen gabapentin (NEURONTIN) 300 MG capsule Take 300-1,500 mg by mouth 4 (four) times daily. Take 300 mg by mouth at 6 am, take 300 mg by mouth at 11:3 am, take 300 mg by mouth at 4:30 pm, take 600 mg by mouth at 10 pm.      . ketoconazole (NIZORAL) 2 % shampoo Apply 1 application topically daily.       . lansoprazole (PREVACID) 15 MG capsule Take 15 mg by mouth daily.      Marland Kitchen lisinopril (PRINIVIL,ZESTRIL) 2.5 MG  tablet TAKE 1 TABLET BY MOUTH DAILY  90 tablet  0  . metFORMIN (GLUCOPHAGE) 500 MG tablet TAKE 1 TABLET (500 MG TOTAL) BY MOUTH 2 (TWO) TIMES DAILY WITH A MEAL.  60 tablet  4  . methocarbamol (ROBAXIN) 500 MG tablet Take 1 tablet (500 mg total) by mouth at bedtime and may repeat dose one time if needed.  60 tablet  2  . montelukast (SINGULAIR) 10 MG tablet Take 10 mg by mouth at bedtime.      . naproxen sodium (ALEVE) 220 MG tablet Take 440 mg by mouth 2 (two) times daily. Take 440 mg in the morning and 220 mg in the evening.      . nitroGLYCERIN (NITROSTAT) 0.4 MG SL tablet Place 0.4 mg under the tongue every 5 (five) minutes as needed for chest pain.      . ramelteon (ROZEREM) 8 MG tablet Take 8 mg by mouth at bedtime.      . sertraline (ZOLOFT) 50 MG tablet  Take 50 mg by mouth daily.      . tamsulosin (FLOMAX) 0.4 MG CAPS capsule Take 0.4 mg by mouth daily.      . traMADol-acetaminophen (ULTRACET) 37.5-325 MG per tablet Take 1-2 tablets by mouth every 8 (eight) hours as needed for severe pain.  180 tablet  5  . traZODone (DESYREL) 100 MG tablet Take 100 mg by mouth at bedtime.       . nitroGLYCERIN (NITROSTAT) 0.4 MG SL tablet Place 0.4 mg under the tongue every 5 (five) minutes as needed. For chest pain      . zolpidem (AMBIEN) 10 MG tablet Take 10 mg by mouth 3 times/day as needed-between meals & bedtime for sleep.      . [DISCONTINUED] niacin (NIASPAN) 1000 MG CR tablet Take 1 tablet (1,000 mg total) by mouth at bedtime.  30 tablet  6   No current facility-administered medications for this visit.    Past Medical History  Diagnosis Date  . Diabetes mellitus   . Asthma   . Enlarged prostate   . Hyperlipidemia   . Heart disease   . Hypertension   . Blood transfusion   . GERD (gastroesophageal reflux disease)   . Refusal of blood transfusions as patient is Jehovah's Witness   . Neuromuscular disorder     DJD  . Coronary artery disease   . CAD (coronary artery disease), non obstructive on cath 2011 04/05/2012  . H/O syncope     Past Surgical History  Procedure Laterality Date  . Appendectomy    . Patent ductus arterious repair      at age 32  . Colonoscopy w/ polypectomy  03/17/11    diminutive polyp  . Cardiac catheterization  2007    Clean Cardiac Cath [facility]), with RCA 30% narrowing likely due to catheter induced spasm in 09/02/10.  . Back surgery    . Cardiac catheterization  09/02/2010    mod. nonobstructive disease in the RCA and CX, tortuous LAD  . Nm myocar perf wall motion  01/25/2008    mild anteroapical wall ischemia  . Loop recorder implant  04/06/2012    Reveal XT 4529    Family History  Problem Relation Age of Onset  . Colon cancer Paternal Grandfather   . Prostate cancer Paternal Grandfather   .  Aneurysm Father   . Heart attack Father 39  . Coronary artery disease Father   . Colon cancer Paternal Uncle     x  2  . Colon polyps Mother   . Heart disease Mother   . Coronary artery disease Mother   . Breast cancer Maternal Grandmother   . Stomach cancer Brother   . Liver disease      unsure who it was    History   Social History  . Marital Status: Married    Spouse Name: N/A    Number of Children: N/A  . Years of Education: N/A   Occupational History  . Not on file.   Social History Main Topics  . Smoking status: Never Smoker   . Smokeless tobacco: Never Used  . Alcohol Use: Yes     Comment: rare  . Drug Use: No  . Sexual Activity: Not on file   Other Topics Concern  . Not on file   Social History Narrative   Holter monitor 08/2010: PVCs and sinus tachy.   Sleep Study (02/2008): mild sleep apnea, no indication for CPAP.    Review of systems: He has intermittent occasional episodes of chest pain that responds to nitroglycerin. He is very sedentary due to his back problems. He complains of dizziness, especially when upright. He has erectile dysfunction and sometimes pain when emptying his bladder  The patient specifically denies any chest pain with exertion, dyspnea with exertion, orthopnea, paroxysmal nocturnal dyspnea, syncope, palpitations, focal neurological deficits, intermittent claudication, lower extremity edema, unexplained weight gain, cough, hemoptysis or wheezing.   PHYSICAL EXAM BP 110/74  Pulse 73  Resp 16  Ht 5\' 8"  (1.727 m)  Wt 213 lb (96.616 kg)  BMI 32.39 kg/m2 General: Alert, oriented x3, no distress  Head: no evidence of trauma, PERRL, EOMI, no exophtalmos or lid lag, no myxedema, no xanthelasma; normal ears, nose and oropharynx  Neck: normal jugular venous pulsations and no hepatojugular reflux; brisk carotid pulses without delay and no carotid bruits  Chest: clear to auscultation, no signs of consolidation by percussion or palpation,  normal fremitus, symmetrical and full respiratory excursions  Cardiovascular: normal position and quality of the apical impulse, regular rhythm, normal first and second heart sounds, no murmurs, rubs or gallops  Abdomen: no tenderness or distention, no masses by palpation, no abnormal pulsatility or arterial bruits, normal bowel sounds, no hepatosplenomegaly  Extremities: no clubbing, cyanosis or edema; 2+ radial, ulnar and brachial pulses bilaterally; 2+ right femoral, posterior tibial and dorsalis pedis pulses; 2+ left femoral, posterior tibial and dorsalis pedis pulses; no subclavian or femoral bruits  Neurological: grossly nonfocal  EKG: Sinus rhythm, incomplete right bundle branch block, possible right ventricular hypertrophy, rightward axis, nonspecific T-wave flattening across the anterolateral precordial leads no change from previous tracings   Lipid Panel     Component Value Date/Time   CHOL 229* 11/20/2013 1150   TRIG 681* 11/20/2013 1150   HDL 37* 11/20/2013 1150   CHOLHDL 6.2 11/20/2013 1150   VLDL NOT CALC 11/20/2013 1150   LDLCALC NOT CALC 11/20/2013 1150    BMET    Component Value Date/Time   NA 136 01/08/2014 0844   K 4.2 01/08/2014 0844   CL 99 01/08/2014 0844   CO2 27 01/08/2014 0844   GLUCOSE 138* 01/08/2014 0844   BUN 17 01/08/2014 0844   CREATININE 0.87 01/08/2014 0844   CREATININE 0.82 04/10/2012 1721   CALCIUM 9.3 01/08/2014 0844   GFRNONAA >89 11/20/2013 1150   GFRNONAA >90 04/10/2012 1721   GFRAA >89 11/20/2013 1150   GFRAA >90 04/10/2012 1721     ASSESSMENT AND PLAN  Not sure  if Mr. Cassetta chest discomfort is related to coronary spasm or esophageal spasm, but I don't think that occasionally taking nitroglycerin as a problem. I warned him to sit down or lay down when he takes this medication.  His hypertriglyceridemia remains a significant issue despite adequate glycemic control and fenofibrate. Encouraged him to increase his intake of omega-3 fatty acids. It will be very  important for him to lose substantial weight. Since he cannot exercise this can only be achieved with reduction in caloric intake, especially reduction in carbohydrates.  I suspect that his dizzy spells are related to orthostatic hypotension, I wonder if he really needs to take the Flomax. His blood pressure is excellent and he has not had any significant ventricular arrhythmia. I have asked him to discontinue metoprolol. It is in such a small dose, I don't think he needs to wean it.  Orders Placed This Encounter  Procedures  . EKG 12-Lead   No orders of the defined types were placed in this encounter.    Holli Humbles, MD, [place] [phone] office [phone] pager

## 2014-06-05 ENCOUNTER — Other Ambulatory Visit: Payer: Self-pay

## 2014-06-05 MED ORDER — GABAPENTIN 300 MG PO CAPS: ORAL_CAPSULE | ORAL | Status: DC

## 2014-06-16 ENCOUNTER — Telehealth: Payer: Self-pay | Admitting: Cardiovascular Disease

## 2014-06-16 MED ORDER — LISINOPRIL 2.5 MG PO TABS: 2.5000 mg | ORAL_TABLET | Freq: Every day | ORAL | Status: DC

## 2014-06-16 NOTE — Telephone Encounter (Signed)
Pt said Dr C took him off of his blood pressure medicine about three weeks ago. His blood pressure is now up ,it have been up to 141/94.Does he wants him to get back on his medicine?

## 2014-06-16 NOTE — Telephone Encounter (Signed)
Resume lisinopril 2.5 mg daily. Stay off metoprolol

## 2014-06-16 NOTE — Telephone Encounter (Signed)
Spoke with pt, aware of dr croitoru recommendations. Script sent to the pharm

## 2014-06-16 NOTE — Telephone Encounter (Signed)
Spoke with pt, his metoprolol and lisinopril were stopped. For the last week and half he has had consistent bp readings 140's/90's. He cont to have dizziness. Will forward for dr croitoru's review

## 2014-07-04 ENCOUNTER — Other Ambulatory Visit: Payer: Self-pay

## 2014-07-08 ENCOUNTER — Encounter: Payer: Self-pay | Admitting: Family

## 2014-07-08 ENCOUNTER — Telehealth: Payer: Self-pay | Admitting: *Deleted

## 2014-07-08 ENCOUNTER — Ambulatory Visit (INDEPENDENT_AMBULATORY_CARE_PROVIDER_SITE_OTHER): Payer: Managed Care, Other (non HMO) | Admitting: Family

## 2014-07-08 VITALS — BP 102/80 | HR 79 | Temp 97.8°F | Resp 16 | Ht 68.0 in | Wt 213.0 lb

## 2014-07-08 DIAGNOSIS — M5416 Radiculopathy, lumbar region: Secondary | ICD-10-CM

## 2014-07-08 DIAGNOSIS — Z23 Encounter for immunization: Secondary | ICD-10-CM

## 2014-07-08 DIAGNOSIS — J309 Allergic rhinitis, unspecified: Secondary | ICD-10-CM | POA: Insufficient documentation

## 2014-07-08 DIAGNOSIS — E785 Hyperlipidemia, unspecified: Secondary | ICD-10-CM

## 2014-07-08 DIAGNOSIS — R350 Frequency of micturition: Secondary | ICD-10-CM

## 2014-07-08 DIAGNOSIS — E119 Type 2 diabetes mellitus without complications: Secondary | ICD-10-CM

## 2014-07-08 LAB — URINALYSIS, ROUTINE W REFLEX MICROSCOPIC
BILIRUBIN URINE: NEGATIVE
Hgb urine dipstick: NEGATIVE
Ketones, ur: NEGATIVE
Leukocytes, UA: NEGATIVE
NITRITE: NEGATIVE
PH: 5.5 (ref 5.0–8.0)
RBC / HPF: NONE SEEN (ref 0–?)
Specific Gravity, Urine: >=1.03 — AB (ref 1.000–1.030)
TOTAL PROTEIN, URINE-UPE24: NEGATIVE
URINE GLUCOSE: NEGATIVE
Urobilinogen, UA: 0.2 (ref 0.0–1.0)

## 2014-07-08 LAB — LIPID PANEL
CHOL/HDL RATIO: 5
Cholesterol: 212 mg/dL — ABNORMAL HIGH (ref 0–200)
HDL: 39.6 mg/dL (ref >39.00)
NonHDL: 172.4
Triglycerides: 401 mg/dL — ABNORMAL HIGH (ref 0.0–149.0)
VLDL: 80.2 mg/dL — ABNORMAL HIGH (ref 0.0–40.0)

## 2014-07-08 LAB — BASIC METABOLIC PANEL
BUN: 15 mg/dL (ref 6–23)
CO2: 25 mEq/L (ref 19–32)
CREATININE: 1 mg/dL (ref 0.4–1.5)
Calcium: 9.3 mg/dL (ref 8.4–10.5)
Chloride: 103 mEq/L (ref 96–112)
GFR: 89.51 mL/min (ref >60.00)
Glucose, Bld: 104 mg/dL — ABNORMAL HIGH (ref 70–99)
Potassium: 4.2 mEq/L (ref 3.5–5.1)
Sodium: 136 mEq/L (ref 135–145)

## 2014-07-08 LAB — HEMOGLOBIN A1C: Hgb A1c MFr Bld: 6 % (ref 4.6–6.5)

## 2014-07-08 MED ORDER — ZOLPIDEM TARTRATE 10 MG PO TABS: 10.0000 mg | ORAL_TABLET | Freq: Two times a day (BID) | ORAL | Status: DC | PRN

## 2014-07-08 MED ORDER — MONTELUKAST SODIUM 10 MG PO TABS: 10.0000 mg | ORAL_TABLET | Freq: Every day | ORAL | Status: DC

## 2014-07-08 MED ORDER — ATORVASTATIN CALCIUM 80 MG PO TABS: 80.0000 mg | ORAL_TABLET | Freq: Every day | ORAL | Status: DC

## 2014-07-08 MED ORDER — PREDNISONE 10 MG PO TABS: ORAL_TABLET | ORAL | Status: DC

## 2014-07-08 MED ORDER — METFORMIN HCL ER 500 MG PO TB24: 1000.0000 mg | ORAL_TABLET | Freq: Every day | ORAL | Status: DC

## 2014-07-08 NOTE — Assessment & Plan Note (Signed)
Trial of zyrtec.  

## 2014-07-08 NOTE — Progress Notes (Signed)
Subjective:    Patient ID: Cody Raymond, male    DOB: 06-30-1971, 43 y.o.   MRN: [id number]  HPI Cody Raymond is a 43 yo male who presents today for follow up.  1. Diabetes - Currently takes metformin. Reports intermittent abdominal pain and diarrhea.  Currently tries to stay away from sweets and carbohydrates.  Does not exercise due to back pain and leg pain.   Lab Results  Component Value Date   HGBA1C 6.2* 11/20/2013   2. Hyperlipidemia -  Currently takes tricor and atorvastatin.  Reports high compliance with medication.    3. Back pain - Started one month ago. Reports lower  back pain, pain in sharp and he rates it 10/10.  He takes Aleve and this helps him.   Pain is not worse with activity.  He states that the pain is constant and has flare ups of pain.  Denies any new injury.  Currently sees Dr. Nelly Rout a workers comp injury in 2012.  He takes gapapentin.  Dr. Melvyn Novas is working him up for uneven leg size and giving him orthotics.   4. Sinus drainage - Throat feels raw due to drainage in the back of his throat.  Started 4 days ago.  Has used chloraseptic spray and mucinex.  When he is out in the cold, his right chest hurts when he takes a deep breath.  Reports an intermittent non productive cough and denies cough.   5. UTI symptoms - Reports urinary frequency and burning.     Review of Systems  Constitutional: Negative for fever, chills and fatigue.  HENT: Positive for postnasal drip and sore throat. Negative for trouble swallowing.   Eyes: Negative.   Respiratory: Positive for cough and wheezing. Negative for chest tightness and shortness of breath.   Cardiovascular: Negative for chest pain, palpitations and leg swelling.  Gastrointestinal: Positive for diarrhea.  Musculoskeletal: Positive for back pain and gait problem.  Skin: Negative for color change, rash and wound.  Neurological: Negative.    Past Medical History  Diagnosis Date  . Diabetes mellitus   . Asthma     . Enlarged prostate   . Hyperlipidemia   . Heart disease   . Hypertension   . Blood transfusion   . GERD (gastroesophageal reflux disease)   . Refusal of blood transfusions as patient is Jehovah's Witness   . Neuromuscular disorder     DJD  . Coronary artery disease   . CAD (coronary artery disease), non obstructive on cath 2011 04/05/2012  . H/O syncope     History   Social History  . Marital Status: Married    Spouse Name: N/A    Number of Children: N/A  . Years of Education: N/A   Occupational History  . Not on file.   Social History Main Topics  . Smoking status: Never Smoker   . Smokeless tobacco: Never Used  . Alcohol Use: Yes     Comment: rare  . Drug Use: No  . Sexual Activity: Not on file   Other Topics Concern  . Not on file   Social History Narrative   Holter monitor 08/2010: PVCs and sinus tachy.   Sleep Study (02/2008): mild sleep apnea, no indication for CPAP.    Past Surgical History  Procedure Laterality Date  . Appendectomy    . Patent ductus arterious repair      at age 51  . Colonoscopy w/ polypectomy  03/17/11    diminutive polyp  . Cardiac  catheterization  2007    Clean Cardiac Cath ([place]), with RCA 30% narrowing likely due to catheter induced spasm in 09/02/10.  . Back surgery    . Cardiac catheterization  09/02/2010    mod. nonobstructive disease in the RCA and CX, tortuous LAD  . Nm myocar perf wall motion  01/25/2008    mild anteroapical wall ischemia  . Loop recorder implant  04/06/2012    Reveal XT 4529    Family History  Problem Relation Age of Onset  . Colon cancer Paternal Grandfather   . Prostate cancer Paternal Grandfather   . Aneurysm Father   . Heart attack Father 39  . Coronary artery disease Father   . Colon cancer Paternal Uncle     x 2  . Colon polyps Mother   . Heart disease Mother   . Coronary artery disease Mother   . Breast cancer Maternal Grandmother   . Stomach cancer Brother   . Liver  disease      unsure who it was    Allergies  Allergen Reactions  . Benadryl [Diphenhydramine Hcl]     "drives me nuts"  . Red Blood Cells     PT. REFUSES ANY BLOOD PRODUCTS - JEHOVAH'S WITNESS.    Current Outpatient Prescriptions on File Prior to Visit  Medication Sig Dispense Refill  . albuterol (PROVENTIL HFA;VENTOLIN HFA) 108 (90 BASE) MCG/ACT inhaler Inhale 2 puffs into the lungs every 6 (six) hours as needed for wheezing or shortness of breath. Use with spacer, for asthma      . albuterol (PROVENTIL) (2.5 MG/3ML) 0.083% nebulizer solution Take 2.5 mg by nebulization every 6 (six) hours as needed for wheezing or shortness of breath. For wheezing      . aspirin 325 MG tablet Take 325 mg by mouth daily.      . betamethasone dipropionate (DIPROLENE) 0.05 % cream Apply 1 application topically every evening.      . budesonide-formoterol (SYMBICORT) 160-4.5 MCG/ACT inhaler Inhale 2 puffs into the lungs 2 (two) times daily.      . cetirizine (ZYRTEC) 10 MG tablet Take 10 mg by mouth daily.      . clonazePAM (KLONOPIN) 1 MG tablet Take 1 mg by mouth daily.       . diclofenac sodium (VOLTAREN) 1 % GEL Apply 2 g topically 4 (four) times daily.      . fenofibrate (TRICOR) 145 MG tablet Take 145 mg by mouth daily.      . fish oil-omega-3 fatty acids 1000 MG capsule Take 2 g by mouth 2 (two) times daily.       Marland Kitchen gabapentin (NEURONTIN) 300 MG capsule Take 300 mg by mouth at 6 am, take 300 mg by mouth at 11:30 am, take 300 mg by mouth at 4:30 pm, take 600 mg by mouth at 10 pm.  150 capsule  2  . ketoconazole (NIZORAL) 2 % shampoo Apply 1 application topically daily.       . lansoprazole (PREVACID) 15 MG capsule Take 15 mg by mouth daily.      Marland Kitchen lisinopril (PRINIVIL,ZESTRIL) 2.5 MG tablet Take 1 tablet (2.5 mg total) by mouth daily.  90 tablet  3  . methocarbamol (ROBAXIN) 500 MG tablet Take 1 tablet (500 mg total) by mouth at bedtime and may repeat dose one time if needed.  60 tablet  2  . naproxen  sodium (ALEVE) 220 MG tablet Take 440 mg by mouth 2 (two) times daily. Take 440 mg  in the morning and 220 mg in the evening.      . nitroGLYCERIN (NITROSTAT) 0.4 MG SL tablet Place 0.4 mg under the tongue every 5 (five) minutes as needed for chest pain.      . ramelteon (ROZEREM) 8 MG tablet Take 8 mg by mouth at bedtime.      . sertraline (ZOLOFT) 50 MG tablet Take 50 mg by mouth daily.      . tamsulosin (FLOMAX) 0.4 MG CAPS capsule Take 0.4 mg by mouth daily.      . traMADol-acetaminophen (ULTRACET) 37.5-325 MG per tablet Take 1-2 tablets by mouth every 8 (eight) hours as needed for severe pain.  180 tablet  5  . traZODone (DESYREL) 100 MG tablet Take 100 mg by mouth at bedtime.       . [DISCONTINUED] niacin (NIASPAN) 1000 MG CR tablet Take 1 tablet (1,000 mg total) by mouth at bedtime.  30 tablet  6   No current facility-administered medications on file prior to visit.    BP 102/80  Pulse 79  Temp(Src) 97.8 F (36.6 C) (Oral)  Resp 16  Ht 5\' 8"  (1.727 m)  Wt 213 lb (96.616 kg)  BMI 32.39 kg/m2  SpO2 97%       Objective:   Physical Exam  Constitutional: He is oriented to person, place, and time. He appears well-developed and well-nourished.  HENT:  Head: Normocephalic.  Eyes: Pupils are equal, round, and reactive to light. Right eye exhibits no discharge. Left eye exhibits no discharge.  Neck: Normal range of motion. No thyromegaly present.  Cardiovascular: Normal rate, regular rhythm, normal heart sounds and intact distal pulses.   No murmur heard. Pulmonary/Chest: Effort normal. No stridor. He has wheezes (bilateral ).  Musculoskeletal: Normal range of motion.  Neurological: He is alert and oriented to person, place, and time.  Skin: Skin is warm and dry. No rash noted.          Assessment & Plan:  Will give prednisone taper. Will obtain A1c, fasting lipids, bmet, and UA with culture. Would like to try extended release Metformin.    I have personally seen and  examined patient and agree with Jettie Booze NP student's assessment and plan- [name] NP

## 2014-07-08 NOTE — Assessment & Plan Note (Addendum)
Will switch to metformin XR for GI symptoms.  Will obtain A1C today and Bmet today.

## 2014-07-08 NOTE — Assessment & Plan Note (Signed)
Continue with atorvastatin and tricor.  Will obtain fasting lipid panel today.

## 2014-07-08 NOTE — Telephone Encounter (Signed)
That is correct 

## 2014-07-08 NOTE — Patient Instructions (Signed)
Complete lab work prior to leaving.  Add claritin 10mg  once daily for nasal drainage. Start prednisone for wheezing and back pain. Check sugar twice daily while on prednisone and call if sugar is >250. Stop metformin, start metformin Extended release.  Call if symptoms worsen or if not improved in 3 days. Follow up in 3 months.

## 2014-07-08 NOTE — Progress Notes (Signed)
Pre visit review using our clinic review tool, if applicable. No additional management support is needed unless otherwise documented below in the visit note. 

## 2014-07-08 NOTE — Assessment & Plan Note (Signed)
Trial of pred taper- continue pain management with Dr. Letta Pate.

## 2014-07-08 NOTE — Telephone Encounter (Signed)
Received call from [facility] requesting clarification of pt's zolpidem Rx. Directions say 1 three times a day and at bedtime as needed for sleep, #30. Advised him to dispense as: 1 tablet at bedtime as needed for sleep. Please advise if directions should be different from once a day?

## 2014-07-09 LAB — URINE CULTURE
COLONY COUNT: NO GROWTH
Organism ID, Bacteria: NO GROWTH

## 2014-07-09 LAB — LDL CHOLESTEROL, DIRECT: LDL DIRECT: 91.5 mg/dL

## 2014-07-10 ENCOUNTER — Telehealth: Payer: Self-pay | Admitting: Family

## 2014-07-10 DIAGNOSIS — E781 Pure hyperglyceridemia: Secondary | ICD-10-CM

## 2014-07-10 MED ORDER — ROSUVASTATIN CALCIUM 20 MG PO TABS: 20.0000 mg | ORAL_TABLET | Freq: Every day | ORAL | Status: DC

## 2014-07-10 NOTE — Telephone Encounter (Signed)
Cholesterol and trigs still above goal. Continue fenofibrate, fish oil. D/C lipitor, instead start crestor 20mg . Repeat flp/lft in 6 weeks, dx hyperlipidemia. Sugar well controlled.  Urine culture is negative.

## 2014-07-11 NOTE — Telephone Encounter (Signed)
Notified pt of below instructions and he voices understanding. States he will call back to schedule lab appt in 6 weeks. Future lab orders entered.

## 2014-08-20 ENCOUNTER — Ambulatory Visit (INDEPENDENT_AMBULATORY_CARE_PROVIDER_SITE_OTHER): Payer: Managed Care, Other (non HMO) | Admitting: Family

## 2014-08-20 ENCOUNTER — Encounter: Payer: Self-pay | Admitting: Family

## 2014-08-20 VITALS — BP 110/80 | HR 96 | Temp 98.0°F | Resp 16 | Ht 68.0 in | Wt 213.8 lb

## 2014-08-20 DIAGNOSIS — E119 Type 2 diabetes mellitus without complications: Secondary | ICD-10-CM

## 2014-08-20 DIAGNOSIS — E781 Pure hyperglyceridemia: Secondary | ICD-10-CM

## 2014-08-20 DIAGNOSIS — J4541 Moderate persistent asthma with (acute) exacerbation: Secondary | ICD-10-CM

## 2014-08-20 DIAGNOSIS — Z23 Encounter for immunization: Secondary | ICD-10-CM

## 2014-08-20 LAB — HEPATIC FUNCTION PANEL
ALBUMIN: 4.4 g/dL (ref 3.5–5.2)
ALT: 27 U/L (ref 0–53)
AST: 22 U/L (ref 0–37)
Alkaline Phosphatase: 109 U/L (ref 39–117)
BILIRUBIN DIRECT: 0 mg/dL (ref 0.0–0.3)
Total Bilirubin: 1.1 mg/dL (ref 0.2–1.2)
Total Protein: 7.8 g/dL (ref 6.0–8.3)

## 2014-08-20 LAB — LIPID PANEL
CHOLESTEROL: 230 mg/dL — AB (ref 0–200)
HDL: 27.5 mg/dL — AB (ref >39.00)
NonHDL: 202.5
Total CHOL/HDL Ratio: 8
VLDL: 135.8 mg/dL — AB (ref 0.0–40.0)

## 2014-08-20 MED ORDER — PREDNISONE 10 MG PO TABS: ORAL_TABLET | ORAL | Status: DC

## 2014-08-20 MED ORDER — AMOXICILLIN-POT CLAVULANATE 875-125 MG PO TABS: 1.0000 | ORAL_TABLET | Freq: Two times a day (BID) | ORAL | Status: DC

## 2014-08-20 MED ORDER — BENZONATATE 100 MG PO CAPS: 100.0000 mg | ORAL_CAPSULE | Freq: Three times a day (TID) | ORAL | Status: DC | PRN

## 2014-08-20 NOTE — Patient Instructions (Signed)
Start prednisone taper and augmentin. Continue symbicort, and use albuterol every 6 hours for the next week. Call if symptoms worsen or if symptoms are not improved in 3 days.

## 2014-08-20 NOTE — Progress Notes (Signed)
Subjective:    Patient ID: Cody Raymond, male    DOB: 06/13/1971, 43 y.o.   MRN: [id number]  HPI   Mr. Epperly is a 43 yr old male who presents today with chief complaint of nasal drainage x 1 week. "feel like I am choking."  Nasal drainage is green. Denies sinus pressure. Denies fever.  Some burning in the chest.  + cough which is dry.  He has tried mucinex without much improvement.    Reports that the dizziness symptoms he discussed with cardiology are mild at this time, wishes to continue flomax.   Review of Systems See HPI  Past Medical History  Diagnosis Date  . Diabetes mellitus   . Asthma   . Enlarged prostate   . Hyperlipidemia   . Heart disease   . Hypertension   . Blood transfusion   . GERD (gastroesophageal reflux disease)   . Refusal of blood transfusions as patient is Jehovah's Witness   . Neuromuscular disorder     DJD  . Coronary artery disease   . CAD (coronary artery disease), non obstructive on cath 2011 04/05/2012  . H/O syncope     History   Social History  . Marital Status: Married    Spouse Name: N/A    Number of Children: N/A  . Years of Education: N/A   Occupational History  . Not on file.   Social History Main Topics  . Smoking status: Never Smoker   . Smokeless tobacco: Never Used  . Alcohol Use: Yes     Comment: rare  . Drug Use: No  . Sexual Activity: Not on file   Other Topics Concern  . Not on file   Social History Narrative   Holter monitor 08/2010: PVCs and sinus tachy.   Sleep Study (02/2008): mild sleep apnea, no indication for CPAP.    Past Surgical History  Procedure Laterality Date  . Appendectomy    . Patent ductus arterious repair      at age 60  . Colonoscopy w/ polypectomy  03/17/11    diminutive polyp  . Cardiac catheterization  2007    Clean Cardiac Cath [facility] Cards), with RCA 30% narrowing likely due to catheter induced spasm in 09/02/10.  . Back surgery    . Cardiac catheterization  09/02/2010      mod. nonobstructive disease in the RCA and CX, tortuous LAD  . Nm myocar perf wall motion  01/25/2008    mild anteroapical wall ischemia  . Loop recorder implant  04/06/2012    Reveal XT 4529    Family History  Problem Relation Age of Onset  . Colon cancer Paternal Grandfather   . Prostate cancer Paternal Grandfather   . Aneurysm Father   . Heart attack Father 63  . Coronary artery disease Father   . Colon cancer Paternal Uncle     x 2  . Colon polyps Mother   . Heart disease Mother   . Coronary artery disease Mother   . Breast cancer Maternal Grandmother   . Stomach cancer Brother   . Liver disease      unsure who it was  . Allergies Daughter     Allergies  Allergen Reactions  . Benadryl [Diphenhydramine Hcl]     "drives me nuts"  . Red Blood Cells     PT. REFUSES ANY BLOOD PRODUCTS - JEHOVAH'S WITNESS.    Current Outpatient Prescriptions on File Prior to Visit  Medication Sig Dispense Refill  . albuterol (  PROVENTIL HFA;VENTOLIN HFA) 108 (90 BASE) MCG/ACT inhaler Inhale 2 puffs into the lungs every 6 (six) hours as needed for wheezing or shortness of breath. Use with spacer, for asthma    . albuterol (PROVENTIL) (2.5 MG/3ML) 0.083% nebulizer solution Take 2.5 mg by nebulization every 6 (six) hours as needed for wheezing or shortness of breath. For wheezing    . aspirin 325 MG tablet Take 325 mg by mouth daily.    . betamethasone dipropionate (DIPROLENE) 0.05 % cream Apply 1 application topically every evening.    . budesonide-formoterol (SYMBICORT) 160-4.5 MCG/ACT inhaler Inhale 2 puffs into the lungs 2 (two) times daily.    . cetirizine (ZYRTEC) 10 MG tablet Take 10 mg by mouth daily.    . clonazePAM (KLONOPIN) 1 MG tablet Take 1 mg by mouth daily.     . diclofenac sodium (VOLTAREN) 1 % GEL Apply 2 g topically 4 (four) times daily.    . fenofibrate (TRICOR) 145 MG tablet Take 145 mg by mouth daily.    . fish oil-omega-3 fatty acids 1000 MG capsule Take 2 g by mouth 2  (two) times daily.     Marland Kitchen gabapentin (NEURONTIN) 300 MG capsule Take 300 mg by mouth at 6 am, take 300 mg by mouth at 11:30 am, take 300 mg by mouth at 4:30 pm, take 600 mg by mouth at 10 pm. 150 capsule 2  . ketoconazole (NIZORAL) 2 % shampoo Apply 1 application topically daily.     . lansoprazole (PREVACID) 15 MG capsule Take 15 mg by mouth daily.    Marland Kitchen lisinopril (PRINIVIL,ZESTRIL) 2.5 MG tablet Take 1 tablet (2.5 mg total) by mouth daily. 90 tablet 3  . metFORMIN (GLUCOPHAGE XR) 500 MG 24 hr tablet Take 2 tablets (1,000 mg total) by mouth daily with breakfast. 60 tablet 5  . methocarbamol (ROBAXIN) 500 MG tablet Take 1 tablet (500 mg total) by mouth at bedtime and may repeat dose one time if needed. 60 tablet 2  . montelukast (SINGULAIR) 10 MG tablet Take 1 tablet (10 mg total) by mouth at bedtime. 30 tablet 5  . naproxen sodium (ALEVE) 220 MG tablet Take 440 mg by mouth 2 (two) times daily. Take 440 mg in the morning and 220 mg in the evening.    . nitroGLYCERIN (NITROSTAT) 0.4 MG SL tablet Place 0.4 mg under the tongue every 5 (five) minutes as needed for chest pain.    . predniSONE (DELTASONE) 10 MG tablet 4 tabs by mouth once daily for 2 days, then 3 tabs by mouth once daily x 2 days, then 2 tabs by mouth once daily for 2 days then 1 tab by mouth once daily x 2 days then stop. 20 tablet 0  . ramelteon (ROZEREM) 8 MG tablet Take 8 mg by mouth at bedtime.    . rosuvastatin (CRESTOR) 20 MG tablet Take 1 tablet (20 mg total) by mouth daily. 30 tablet 3  . sertraline (ZOLOFT) 50 MG tablet Take 50 mg by mouth daily.    . tamsulosin (FLOMAX) 0.4 MG CAPS capsule Take 0.4 mg by mouth daily.    . traMADol-acetaminophen (ULTRACET) 37.5-325 MG per tablet Take 1-2 tablets by mouth every 8 (eight) hours as needed for severe pain. 180 tablet 5  . traZODone (DESYREL) 100 MG tablet Take 100 mg by mouth at bedtime.     Marland Kitchen zolpidem (AMBIEN) 10 MG tablet Take 10 mg by mouth at bedtime as needed for sleep.    .  [DISCONTINUED]  niacin (NIASPAN) 1000 MG CR tablet Take 1 tablet (1,000 mg total) by mouth at bedtime. 30 tablet 6   No current facility-administered medications on file prior to visit.    BP 110/80 mmHg  Pulse 96  Temp(Src) 98 F (36.7 C) (Oral)  Resp 16  Ht 5\' 8"  (1.727 m)  Wt 213 lb 12.8 oz (96.979 kg)  BMI 32.52 kg/m2  SpO2 95%       Objective:   Physical Exam  Constitutional: He is oriented to person, place, and time. He appears well-developed and well-nourished. No distress.  HENT:  Head: Normocephalic and atraumatic.  Right Ear: Tympanic membrane and ear canal normal.  Left Ear: Tympanic membrane and ear canal normal.  Mouth/Throat: No oropharyngeal exudate.  Eyes: No scleral icterus.  Cardiovascular: Normal rate and regular rhythm.   No murmur heard. Pulmonary/Chest: Effort normal. No respiratory distress. He has wheezes. He has no rales.  Musculoskeletal: He exhibits no edema.  Neurological: He is alert and oriented to person, place, and time.  Psychiatric: He has a normal mood and affect. His behavior is normal. Judgment and thought content normal.          Assessment & Plan:

## 2014-08-20 NOTE — Progress Notes (Signed)
Pre visit review using our clinic review tool, if applicable. No additional management support is needed unless otherwise documented below in the visit note. 

## 2014-08-20 NOTE — Assessment & Plan Note (Signed)
43 yr old male with acute asthma exacerbation. Will rx with pred taper, continue symbicort/albuterol. Add tessalon prn cough. Will also rx with augmentin for sinusitis which is likely exacerbating pt's asthma.

## 2014-08-21 ENCOUNTER — Other Ambulatory Visit: Payer: Self-pay | Admitting: Family

## 2014-08-21 ENCOUNTER — Other Ambulatory Visit: Payer: Self-pay | Admitting: Physical Medicine & Rehabilitation

## 2014-08-21 LAB — LDL CHOLESTEROL, DIRECT: Direct LDL: 59.4 mg/dL

## 2014-08-21 LAB — HM DIABETES EYE EXAM

## 2014-08-21 NOTE — Telephone Encounter (Signed)
Triglycerides are extremely high.  Has he been taking fenofibrate regularly? If so, we need to increase his crestor to 40mg  and plan to repeat lipid panel in 6 weeks.  Work hard on avoiding sweets and white carbs. Liver function testing is normal.

## 2014-08-22 NOTE — Telephone Encounter (Signed)
Notified pt. He voices understanding and states he has not taken fenofibrate in 6 weeks.  He will restart medication.  Please advise if further instructions?

## 2014-08-25 NOTE — Telephone Encounter (Signed)
Follow-up fasting lipid panel in 6 weeks per Melissa.  Please call patient so he can schedule lab appointment.

## 2014-08-25 NOTE — Telephone Encounter (Signed)
Pt has F/U appt scheduled for 10/13/2014 with Melissa. Okay to check lipid panel then?

## 2014-08-26 ENCOUNTER — Other Ambulatory Visit: Payer: Self-pay | Admitting: *Deleted

## 2014-08-26 MED ORDER — TRAMADOL-ACETAMINOPHEN 37.5-325 MG PO TABS: 1.0000 | ORAL_TABLET | Freq: Three times a day (TID) | ORAL | Status: DC | PRN

## 2014-08-26 NOTE — Telephone Encounter (Signed)
Yes, ok to check then.

## 2014-08-28 ENCOUNTER — Encounter (HOSPITAL_COMMUNITY): Payer: Self-pay | Admitting: Cardiovascular Disease

## 2014-08-29 ENCOUNTER — Encounter: Payer: Self-pay | Admitting: Family

## 2014-09-01 ENCOUNTER — Telehealth: Payer: Self-pay | Admitting: *Deleted

## 2014-09-01 NOTE — Telephone Encounter (Signed)
Refill request for clonopin - has not been RX'd since 2014

## 2014-10-13 ENCOUNTER — Telehealth: Payer: Self-pay | Admitting: Family

## 2014-10-13 ENCOUNTER — Encounter: Payer: Self-pay | Admitting: Family

## 2014-10-13 ENCOUNTER — Ambulatory Visit (INDEPENDENT_AMBULATORY_CARE_PROVIDER_SITE_OTHER): Payer: Medicare Other | Admitting: Family

## 2014-10-13 DIAGNOSIS — E1310 Other specified diabetes mellitus with ketoacidosis without coma: Secondary | ICD-10-CM

## 2014-10-13 DIAGNOSIS — E1110 Type 2 diabetes mellitus with ketoacidosis without coma: Secondary | ICD-10-CM

## 2014-10-13 DIAGNOSIS — R079 Chest pain, unspecified: Secondary | ICD-10-CM

## 2014-10-13 LAB — BASIC METABOLIC PANEL
BUN: 12 mg/dL (ref 6–23)
CO2: 27 meq/L (ref 19–32)
Calcium: 9.5 mg/dL (ref 8.4–10.5)
Chloride: 100 mEq/L (ref 96–112)
Creatinine, Ser: 0.86 mg/dL (ref 0.40–1.50)
GFR: 102.72 mL/min (ref >60.00)
Glucose, Bld: 128 mg/dL — ABNORMAL HIGH (ref 70–99)
Potassium: 4 mEq/L (ref 3.5–5.1)
SODIUM: 136 meq/L (ref 135–145)

## 2014-10-13 LAB — TSH: TSH: 1.62 u[IU]/mL (ref 0.35–4.50)

## 2014-10-13 LAB — SEDIMENTATION RATE: Sed Rate: 11 mm/hr (ref 0–22)

## 2014-10-13 LAB — HEMOGLOBIN A1C: HEMOGLOBIN A1C: 6.5 % (ref 4.6–6.5)

## 2014-10-13 MED ORDER — FENOFIBRATE 145 MG PO TABS: 145.0000 mg | ORAL_TABLET | Freq: Every day | ORAL | Status: DC

## 2014-10-13 MED ORDER — METFORMIN HCL ER 500 MG PO TB24: 500.0000 mg | ORAL_TABLET | Freq: Every day | ORAL | Status: DC

## 2014-10-13 MED ORDER — NITROGLYCERIN 0.4 MG SL SUBL: 0.4000 mg | SUBLINGUAL_TABLET | SUBLINGUAL | Status: DC | PRN

## 2014-10-13 MED ORDER — GABAPENTIN 300 MG PO CAPS: ORAL_CAPSULE | ORAL | Status: DC

## 2014-10-13 MED ORDER — ZOLPIDEM TARTRATE 10 MG PO TABS: 10.0000 mg | ORAL_TABLET | Freq: Every evening | ORAL | Status: DC | PRN

## 2014-10-13 MED ORDER — SERTRALINE HCL 50 MG PO TABS: 50.0000 mg | ORAL_TABLET | Freq: Every day | ORAL | Status: DC

## 2014-10-13 MED ORDER — METHOCARBAMOL 500 MG PO TABS: 500.0000 mg | ORAL_TABLET | Freq: Every evening | ORAL | Status: DC | PRN

## 2014-10-13 MED ORDER — ROSUVASTATIN CALCIUM 40 MG PO TABS: 40.0000 mg | ORAL_TABLET | Freq: Every day | ORAL | Status: DC

## 2014-10-13 MED ORDER — TAMSULOSIN HCL 0.4 MG PO CAPS: 0.4000 mg | ORAL_CAPSULE | Freq: Every day | ORAL | Status: DC

## 2014-10-13 MED ORDER — KETOCONAZOLE 2 % EX SHAM: 1.0000 "application " | MEDICATED_SHAMPOO | Freq: Every day | CUTANEOUS | Status: DC

## 2014-10-13 MED ORDER — FLUOCINONIDE 0.05 % EX GEL: CUTANEOUS | Status: DC

## 2014-10-13 MED ORDER — MONTELUKAST SODIUM 10 MG PO TABS: 10.0000 mg | ORAL_TABLET | Freq: Every day | ORAL | Status: DC

## 2014-10-13 MED ORDER — TRAZODONE HCL 100 MG PO TABS: 100.0000 mg | ORAL_TABLET | Freq: Every day | ORAL | Status: DC

## 2014-10-13 MED ORDER — LISINOPRIL 2.5 MG PO TABS: 2.5000 mg | ORAL_TABLET | Freq: Every day | ORAL | Status: DC

## 2014-10-13 MED ORDER — BUDESONIDE-FORMOTEROL FUMARATE 160-4.5 MCG/ACT IN AERO: 2.0000 | INHALATION_SPRAY | Freq: Two times a day (BID) | RESPIRATORY_TRACT | Status: DC

## 2014-10-13 NOTE — Assessment & Plan Note (Signed)
Has not taken fenofibrate or crestor for 1.5 months due to having no insurance. Will have insurance 10/20/2014. Resume  Crestor and fenofibrate after he obtains the meds.

## 2014-10-13 NOTE — Assessment & Plan Note (Signed)
Reports increased depression/anxiety. Has been unable to see counselor because he has not had insurance. Given number for behavioral health here at [facility].

## 2014-10-13 NOTE — Progress Notes (Signed)
Pre visit review using our clinic review tool, if applicable. No additional management support is needed unless otherwise documented below in the visit note. 

## 2014-10-13 NOTE — Assessment & Plan Note (Signed)
Reports increased depression/anxiety. Has been unable to see counselor because he has not had insurance. Given number for behavioral health here at [facility].

## 2014-10-13 NOTE — Progress Notes (Signed)
Subjective:    Patient ID: Cody Raymond, male    DOB: 1970-11-09, 44 y.o.   MRN: [id number]  HPI Mr. Villavicencio is here today for follow up of multiple medical problems. He has not taken any medicine for 1.5 months except metformin. 1. DM: Fasting BS: run 90-130 but mostly below 100. 2 hour Post-prandial BS: 200-300 Diet: cheats sometimes and eats cake or chips.  Exercise: goes to gym and walks treadmill 10 minutes one day per week. Eye exam: went about 1.5 months ago. Reports eye exam was normal. Reports compliance with metformin. Reprots episodes of hypoglycemia 3-4 times weekly always in am.  Lab Results  Component Value Date   HGBA1C 6.0 07/08/2014   Lab Results  Component Value Date   HGBA1C 6.0 07/08/2014   HGBA1C 6.2* 11/20/2013   HGBA1C 6.2* 07/29/2013   Lab Results  Component Value Date   MICROALBUR 1.15 11/20/2013   LDLCALC NOT CALC 11/20/2013   CREATININE 1.0 07/08/2014   Wt Readings from Last 3 Encounters:  10/13/14 222 lb (100.699 kg)  08/20/14 213 lb 12.8 oz (96.979 kg)  07/08/14 213 lb (96.616 kg)    2. Hyperlipidemia:Has not ben taking fenofibrate for 1.5 months-has not had insurance. Diet not good.  Lab Results  Component Value Date   CHOL 230* 08/20/2014   HDL 27.50* 08/20/2014   LDLCALC NOT CALC 11/20/2013   LDLDIRECT 59.4 08/20/2014   TRIG * 08/20/2014    679.0 Triglyceride is over 400; calculations on Lipids are invalid.   CHOLHDL 8 08/20/2014   3. CAD: Went to cardiologist 3 months ago and needs to follow up. Reports chest pain almost every day and has to take NTG. One dose of NTG takes care of CP. Also reports palpitations when stressed accompanied by pain in lower back. Feels like he is going to pass out. Dyspnea: Most of the time due to asthma. Edema: not that he has noticed.   4. Depression: Need therapist that takes [place] Medicare complete HMO plan       Review of Systems    see HPI  Past Medical History  Diagnosis  Date  . Diabetes mellitus   . Asthma   . Enlarged prostate   . Hyperlipidemia   . Heart disease   . Hypertension   . Blood transfusion   . GERD (gastroesophageal reflux disease)   . Refusal of blood transfusions as patient is Jehovah's Witness   . Neuromuscular disorder     DJD  . Coronary artery disease   . CAD (coronary artery disease), non obstructive on cath 2011 04/05/2012  . H/O syncope     History   Social History  . Marital Status: Married    Spouse Name: N/A    Number of Children: N/A  . Years of Education: N/A   Occupational History  . Not on file.   Social History Main Topics  . Smoking status: Never Smoker   . Smokeless tobacco: Never Used  . Alcohol Use: Yes     Comment: rare  . Drug Use: No  . Sexual Activity: Not on file   Other Topics Concern  . Not on file   Social History Narrative   Holter monitor 08/2010: PVCs and sinus tachy.   Sleep Study (02/2008): mild sleep apnea, no indication for CPAP.    Past Surgical History  Procedure Laterality Date  . Appendectomy    . Patent ductus arterious repair      at age 40  .  Colonoscopy w/ polypectomy  03/17/11    diminutive polyp  . Cardiac catheterization  2007    Clean Cardiac Cath [facility] Cards), with RCA 30% narrowing likely due to catheter induced spasm in 09/02/10.  . Back surgery    . Cardiac catheterization  09/02/2010    mod. nonobstructive disease in the RCA and CX, tortuous LAD  . Nm myocar perf wall motion  01/25/2008    mild anteroapical wall ischemia  . Loop recorder implant  04/06/2012    Reveal XT 4529  . Loop recorder implant N/A 04/06/2012    Procedure: LOOP RECORDER IMPLANT;  Surgeon: Sanda Klein, MD;  Location: [place] CATH LAB;  Service: Cardiovascular;  Laterality: N/A;  . Loop recorder explant N/A 01/14/2014    Procedure: LOOP RECORDER EXPLANT;  Surgeon: Sanda Klein, MD;  Location: [place] CATH LAB;  Service: Cardiovascular;  Laterality: N/A;    Family History  Problem  Relation Age of Onset  . Colon cancer Paternal Grandfather   . Prostate cancer Paternal Grandfather   . Aneurysm Father   . Heart attack Father 73  . Coronary artery disease Father   . Colon cancer Paternal Uncle     x 2  . Colon polyps Mother   . Heart disease Mother   . Coronary artery disease Mother   . Breast cancer Maternal Grandmother   . Stomach cancer Brother   . Liver disease      unsure who it was  . Allergies Daughter     Allergies  Allergen Reactions  . Benadryl [Diphenhydramine Hcl]     "drives me nuts"  . Red Blood Cells     PT. REFUSES ANY BLOOD PRODUCTS - JEHOVAH'S WITNESS.    Current Outpatient Prescriptions on File Prior to Visit  Medication Sig Dispense Refill  . albuterol (PROVENTIL HFA;VENTOLIN HFA) 108 (90 BASE) MCG/ACT inhaler Inhale 2 puffs into the lungs every 6 (six) hours as needed for wheezing or shortness of breath. Use with spacer, for asthma    . albuterol (PROVENTIL) (2.5 MG/3ML) 0.083% nebulizer solution Take 2.5 mg by nebulization every 6 (six) hours as needed for wheezing or shortness of breath. For wheezing    . aspirin 325 MG tablet Take 325 mg by mouth daily.    . cetirizine (ZYRTEC) 10 MG tablet Take 10 mg by mouth daily.    . clonazePAM (KLONOPIN) 1 MG tablet Take 1 mg by mouth daily.     . diclofenac sodium (VOLTAREN) 1 % GEL Apply 2 g topically 4 (four) times daily.    . fish oil-omega-3 fatty acids 1000 MG capsule Take 2 g by mouth 2 (two) times daily.     . lansoprazole (PREVACID) 15 MG capsule Take 15 mg by mouth daily.    . naproxen sodium (ALEVE) 220 MG tablet Take 440 mg by mouth 2 (two) times daily. Take 440 mg in the morning and 220 mg in the evening.    . predniSONE (DELTASONE) 10 MG tablet 4 tabs by mouth once daily for 2 days, then 3 tabs by mouth once daily x 2 days, then 2 tabs by mouth once daily for 2 days then 1 tab by mouth once daily x 2 days then stop. 20 tablet 0  . ramelteon (ROZEREM) 8 MG tablet Take 8 mg by mouth  at bedtime.    . traMADol-acetaminophen (ULTRACET) 37.5-325 MG per tablet Take 1-2 tablets by mouth every 8 (eight) hours as needed for severe pain. 180 tablet 5  . [DISCONTINUED]  niacin (NIASPAN) 1000 MG CR tablet Take 1 tablet (1,000 mg total) by mouth at bedtime. 30 tablet 6   No current facility-administered medications on file prior to visit.    BP 118/84 mmHg  Pulse 98  Temp(Src) 98 F (36.7 C) (Oral)  Resp 16  Ht 5\' 8"  (1.727 m)  Wt 222 lb (100.699 kg)  BMI 33.76 kg/m2  SpO2 98%    Objective:   Physical Exam  Constitutional: He is oriented to person, place, and time. He appears well-developed and well-nourished.  HENT:  Head: Normocephalic and atraumatic.  Cardiovascular: Normal rate and regular rhythm.   No murmur heard. Pulmonary/Chest: Effort normal and breath sounds normal. No respiratory distress. He has no rales. He exhibits no tenderness.  Upper airway Pseudo-wheeze is noted.  Disappears when pt breaths through his nose.   Musculoskeletal: He exhibits no edema.  Neurological: He is alert and oriented to person, place, and time.  Skin: Skin is warm and dry.  Psychiatric: He has a normal mood and affect. His behavior is normal. Judgment and thought content normal.          Assessment & Plan:  Patient seen along with [facility] NP-student.  I have personally seen and examined patient and agree with Ms. Whitmire's assessment and plan- [name] NP  Reviewed EKG with Dr. Charlett Blake, she felt that given that pt's symptoms are not new, it is reasonable to send pt home and have his cardiologist review when he is in office (pt seen at 9:30 and office not open until 10 due to weather).  I forwarded EKG to Dr. Sallyanne Kuster- see his response in today's phone note.  I think pt will benefit from resuming his regular meds- especially zoloft for his anxiety and depression.  His drug plan will become active next week.

## 2014-10-13 NOTE — Telephone Encounter (Signed)
Notified pt and he voices understanding. 

## 2014-10-13 NOTE — Patient Instructions (Signed)
Please complete lab work prior to leaving.  Stop diprolene cream, start fluocinonide gel as needed. Continue nizoral shampoo. Decrease metformin to one tab by mouth once daily. Restart your regular medications including crestor and fenofibrate. Go to the ER if you develop chest pain not relieved by nitroglycerin. We will work on arranging follow up with cardiology.  Follow up in 3 months.

## 2014-10-13 NOTE — Assessment & Plan Note (Addendum)
Reports feeling hypoglycemic in am. Will reduce metformin ER dose to 500 mg once daily. Will obtain A1c today.

## 2014-10-13 NOTE — Telephone Encounter (Signed)
-----   Message from Sanda Klein, MD sent at 10/13/2014  9:54 AM EST ----- I agree his ST segments in V2 are different, but I don't think this is CAD related. Looks a little worrisome for Brugada syndrome and I'll have one of our EP specialists review it. He had syncope 4 years ago, but no arrhythmia during 3 years of loop recorder monitoring. I would like to see him back. Thanks, Skip Estimable, does this look worrisome for Brugada? Family history of HOCM (mother had septal myectomy) and he has very mild and stable LVH by echo. Thanks,  MCr  ----- Message -----    From: [name], NP    Sent: 10/13/2014   9:38 AM      To: Sanda Klein, MD  Dr. Sallyanne Kuster,  Would you be kind enough to view his EKG today. He has had 1 month hx of atypical CP, some change in v2.  I will try to get him in to see you for follow up.    Thanks,  [employer]

## 2014-10-13 NOTE — Assessment & Plan Note (Addendum)
Reports chest pain almost daily relieved by 1 nitroglycerin and palpitations with stress. Has been off all meds for 1.5 months due to having no insurance. Will make follow up appointment with cardiologist for him after Feb 1 when insurance starts. Do EKG, sed rate, TSH, and BMET today.Marland Kitchen

## 2014-10-13 NOTE — Telephone Encounter (Signed)
Please contact cardiology and arrange appointment this week with cardiology.  Pt is already established with

## 2014-10-13 NOTE — Assessment & Plan Note (Signed)
Sleeping about 4 hours of interrupted sleep per night. He has not had meds due to having no insurance. Resume Ambien and trazodone.

## 2014-10-13 NOTE — Telephone Encounter (Addendum)
Cody Raymond- Please contact patient and let him know that I reviewed his EKG with Dr. Sallyanne Kuster.  He does not think that the changes are related to CAD (heart blockage).  He is going to have one of the EP specialists take a look at is as well and does want to see him back in the office.  We will arrange.   Marj- please arrange follow up with Dr. Sallyanne Kuster this week.  Pt is already established.

## 2014-10-14 ENCOUNTER — Telehealth: Payer: Self-pay | Admitting: *Deleted

## 2014-10-14 NOTE — Telephone Encounter (Signed)
Notified patient that Dr. Loletha Grayer wants him to come into the office for eval of abnormal EKG.  Patient aware and is able to come in Friday @ 9:45am.  Appointment scheduled.

## 2014-10-14 NOTE — Telephone Encounter (Signed)
-----   Message from Sanda Klein, MD sent at 10/14/2014  8:31 AM EST ----- Pamala Hurry, when he returns for f/u need ECG done with V1 and V2 performed higher than normal (2nd intercostal space). Please confirm he has made appt. MCr  ----- Message -----    From: Deboraha Sprang, MD    Sent: 10/13/2014   3:53 PM      To: Sanda Klein, MD  A valid concern   Would consider repeat ECG with V1 and V2 in @ [facility]  If concerning hx of syncope could do flecainide challenge.  i am not aware of HCM looking like Brugada-- had a similar question for Wynona Canes a few months ago Has the family been screened for HCM ? steve ----- Message -----    From: Sanda Klein, MD    Sent: 10/13/2014   9:54 AM      To: Deboraha Sprang, MD, [name], NP  I agree his ST segments in V2 are different, but I don't think this is CAD related. Looks a little worrisome for Brugada syndrome and I'll have one of our EP specialists review it. He had syncope 4 years ago, but no arrhythmia during 3 years of loop recorder monitoring. I would like to see him back. Thanks, Skip Estimable, does this look worrisome for Brugada? Family history of HOCM (mother had septal myectomy) and he has very mild and stable LVH by echo. Thanks,  MCr  ----- Message -----    From: [name], NP    Sent: 10/13/2014   9:38 AM      To: Sanda Klein, MD  Dr. Sallyanne Kuster,  Would you be kind enough to view his EKG today. He has had 1 month hx of atypical CP, some change in v2.  I will try to get him in to see you for follow up.    Thanks,  [employer]

## 2014-10-15 ENCOUNTER — Encounter: Payer: Self-pay | Admitting: Family

## 2014-10-17 ENCOUNTER — Encounter: Payer: Self-pay | Admitting: Cardiovascular Disease

## 2014-10-17 ENCOUNTER — Ambulatory Visit (INDEPENDENT_AMBULATORY_CARE_PROVIDER_SITE_OTHER): Payer: Self-pay | Admitting: Cardiovascular Disease

## 2014-10-17 VITALS — BP 120/80 | HR 84 | Resp 16 | Ht 68.0 in | Wt 223.2 lb

## 2014-10-17 DIAGNOSIS — E781 Pure hyperglyceridemia: Secondary | ICD-10-CM

## 2014-10-17 DIAGNOSIS — R0602 Shortness of breath: Secondary | ICD-10-CM

## 2014-10-17 DIAGNOSIS — Z9189 Other specified personal risk factors, not elsewhere classified: Secondary | ICD-10-CM

## 2014-10-17 DIAGNOSIS — R0789 Other chest pain: Secondary | ICD-10-CM

## 2014-10-17 DIAGNOSIS — I25119 Atherosclerotic heart disease of native coronary artery with unspecified angina pectoris: Secondary | ICD-10-CM

## 2014-10-17 DIAGNOSIS — F329 Major depressive disorder, single episode, unspecified: Secondary | ICD-10-CM

## 2014-10-17 DIAGNOSIS — M549 Dorsalgia, unspecified: Secondary | ICD-10-CM

## 2014-10-17 DIAGNOSIS — Z87898 Personal history of other specified conditions: Secondary | ICD-10-CM

## 2014-10-17 DIAGNOSIS — I498 Other specified cardiac arrhythmias: Secondary | ICD-10-CM

## 2014-10-17 DIAGNOSIS — I4510 Unspecified right bundle-branch block: Secondary | ICD-10-CM

## 2014-10-17 DIAGNOSIS — G8929 Other chronic pain: Secondary | ICD-10-CM

## 2014-10-17 DIAGNOSIS — F32A Depression, unspecified: Secondary | ICD-10-CM

## 2014-10-17 DIAGNOSIS — Q248 Other specified congenital malformations of heart: Secondary | ICD-10-CM

## 2014-10-17 NOTE — Patient Instructions (Signed)
Your physician has requested that you have a lexiscan myoview. For further information please visit [email or web address]. Please follow instruction sheet, as given.  Your physician has requested that you have an echocardiogram. Echocardiography is a painless test that uses sound waves to create images of your heart. It provides your doctor with information about the size and shape of your heart and how well your heart's chambers and valves are working. This procedure takes approximately one hour. There are no restrictions for this procedure.  You have been referred to Dr. Virl Axe at [place].

## 2014-10-20 ENCOUNTER — Encounter: Payer: Self-pay | Admitting: Cardiovascular Disease

## 2014-10-20 DIAGNOSIS — Z87898 Personal history of other specified conditions: Secondary | ICD-10-CM | POA: Insufficient documentation

## 2014-10-20 NOTE — Progress Notes (Signed)
Patient ID: Cody Raymond, male   DOB: 06/01/1971, 44 y.o.   MRN: [id number]     Reason for office visit New ECG abnormalities, history of syncope, mild CAD, mixed hyperlipidemia  For the last several weeks, Cody Raymond has had some atypical chest discomfort associated with emotional outbursts. ECG in his primary care physician's office showed new abnormalities. Existing incomplete right bundle branch block is now accompanied by roughly 1.5 mm ST elevation seen primarily in lead V2. No significant change in lead V3. This appears to be present whether or not he has chest pain at the time. He was unable to complete a plain treadmill stress test in March 2015 due to low back pain.  He has a history of surgical repair of patent ductus arteriosus at age [age] year. He has moderately severe hypertriglyceridemia. In 2009 and 2011 he had coronary angiography preceded by false positive nuclear stress test showing only very mild coronary atherosclerosis.  coronary spasm associated with a catheter tip was noted in the right coronary artery. His mother has hypertrophic obstructive cardiomyopathy and has previously undergone septal myectomy. The patient was screened for echo findings of hypertrophic cardiomyopathy and has only very mild symmetrical concentric LVH, without overt diastolic dysfunction or outflow tract obstruction.  Roughly 3 years ago he had an episode of heralded syncope where he abruptly lost consciousness and fell onto his coffee table, smashing right through it. The loop recorder was subsequently implanted. It has never recorded arrhythmia and he has not had near-syncope. The device was explanted last year.    Allergies  Allergen Reactions  . Benadryl [Diphenhydramine Hcl]     "drives me nuts"  . Red Blood Cells     PT. REFUSES ANY BLOOD PRODUCTS - [employer].    Current Outpatient Prescriptions  Medication Sig Dispense Refill  . albuterol (PROVENTIL HFA;VENTOLIN HFA) 108 (90 BASE)  MCG/ACT inhaler Inhale 2 puffs into the lungs every 6 (six) hours as needed for wheezing or shortness of breath. Use with spacer, for asthma    . albuterol (PROVENTIL) (2.5 MG/3ML) 0.083% nebulizer solution Take 2.5 mg by nebulization every 6 (six) hours as needed for wheezing or shortness of breath. For wheezing    . aspirin 325 MG tablet Take 325 mg by mouth daily.    . budesonide-formoterol (SYMBICORT) 160-4.5 MCG/ACT inhaler Inhale 2 puffs into the lungs 2 (two) times daily. 3 Inhaler 1  . cetirizine (ZYRTEC) 10 MG tablet Take 10 mg by mouth daily.    . clonazePAM (KLONOPIN) 1 MG tablet Take 1 mg by mouth daily.     . diclofenac sodium (VOLTAREN) 1 % GEL Apply 2 g topically 4 (four) times daily.    . fenofibrate (TRICOR) 145 MG tablet Take 1 tablet (145 mg total) by mouth daily. 90 tablet 1  . fish oil-omega-3 fatty acids 1000 MG capsule Take 2 g by mouth 2 (two) times daily.     . fluocinonide gel (LIDEX) 0.05 % Apply sparingly to scalp twice daily as needed 60 g 2  . gabapentin (NEURONTIN) 300 MG capsule Take 300 mg by mouth at 6 am, take 300 mg by mouth at 11:30 am, take 300 mg by mouth at 4:30 pm, take 600 mg by mouth at 10 pm. 450 capsule 1  . ketoconazole (NIZORAL) 2 % shampoo Apply 1 application topically daily. 360 mL 1  . lansoprazole (PREVACID) 15 MG capsule Take 15 mg by mouth daily.    Marland Kitchen lisinopril (PRINIVIL,ZESTRIL) 2.5 MG tablet Take 1  tablet (2.5 mg total) by mouth daily. 90 tablet 1  . metFORMIN (GLUCOPHAGE XR) 500 MG 24 hr tablet Take 1 tablet (500 mg total) by mouth daily with breakfast. 90 tablet 1  . methocarbamol (ROBAXIN) 500 MG tablet Take 1 tablet (500 mg total) by mouth at bedtime and may repeat dose one time if needed. 90 tablet 1  . montelukast (SINGULAIR) 10 MG tablet Take 1 tablet (10 mg total) by mouth at bedtime. 90 tablet 1  . naproxen sodium (ALEVE) 220 MG tablet Take 440 mg by mouth 2 (two) times daily. Take 440 mg in the morning and 220 mg in the evening.    .  nitroGLYCERIN (NITROSTAT) 0.4 MG SL tablet Place 1 tablet (0.4 mg total) under the tongue every 5 (five) minutes as needed for chest pain. 30 tablet 1  . ramelteon (ROZEREM) 8 MG tablet Take 8 mg by mouth at bedtime.    . rosuvastatin (CRESTOR) 40 MG tablet Take 1 tablet (40 mg total) by mouth daily. 90 tablet 3  . sertraline (ZOLOFT) 50 MG tablet Take 1 tablet (50 mg total) by mouth daily. 90 tablet 1  . tamsulosin (FLOMAX) 0.4 MG CAPS capsule Take 1 capsule (0.4 mg total) by mouth daily. 90 capsule 1  . traMADol-acetaminophen (ULTRACET) 37.5-325 MG per tablet Take 1-2 tablets by mouth every 8 (eight) hours as needed for severe pain. 180 tablet 5  . traZODone (DESYREL) 100 MG tablet Take 1 tablet (100 mg total) by mouth at bedtime. 90 tablet 1  . zolpidem (AMBIEN) 10 MG tablet Take 1 tablet (10 mg total) by mouth at bedtime as needed for sleep. 30 tablet 0  . [DISCONTINUED] niacin (NIASPAN) 1000 MG CR tablet Take 1 tablet (1,000 mg total) by mouth at bedtime. 30 tablet 6   No current facility-administered medications for this visit.    Past Medical History  Diagnosis Date  . Diabetes mellitus   . Asthma   . Enlarged prostate   . Hyperlipidemia   . Heart disease   . Hypertension   . Blood transfusion   . GERD (gastroesophageal reflux disease)   . Refusal of blood transfusions as patient is Jehovah's Witness   . Neuromuscular disorder     DJD  . Coronary artery disease   . CAD (coronary artery disease), non obstructive on cath 2011 04/05/2012  . H/O syncope     Past Surgical History  Procedure Laterality Date  . Appendectomy    . Patent ductus arterious repair      at age 70  . Colonoscopy w/ polypectomy  03/17/11    diminutive polyp  . Cardiac catheterization  2007    [facility] Cardiac [facility]), with RCA 30% narrowing likely due to catheter induced spasm in 09/02/10.  . Back surgery    . Cardiac catheterization  09/02/2010    mod. nonobstructive disease in the RCA  and CX, tortuous LAD  . Nm myocar perf wall motion  01/25/2008    mild anteroapical wall ischemia  . Loop recorder implant  04/06/2012    Reveal XT 4529  . Loop recorder implant N/A 04/06/2012    Procedure: LOOP RECORDER IMPLANT;  Surgeon: Sanda Klein, MD;  Location: [facility];  Service: Cardiovascular;  Laterality: N/A;  . Loop recorder explant N/A 01/14/2014    Procedure: LOOP RECORDER EXPLANT;  Surgeon: Sanda Klein, MD;  Location: [facility];  Service: Cardiovascular;  Laterality: N/A;    Family History  Problem Relation Age  of Onset  . Colon cancer Paternal Grandfather   . Prostate cancer Paternal Grandfather   . Aneurysm Father   . Heart attack Father 62  . Coronary artery disease Father   . Colon cancer Paternal Uncle     x 2  . Colon polyps Mother   . Heart disease Mother   . Coronary artery disease Mother   . Breast cancer Maternal Grandmother   . Stomach cancer Brother   . Liver disease      unsure who it was  . Allergies Daughter     History   Social History  . Marital Status: Married    Spouse Name: N/A    Number of Children: N/A  . Years of Education: N/A   Occupational History  . Not on file.   Social History Main Topics  . Smoking status: Never Smoker   . Smokeless tobacco: Never Used  . Alcohol Use: Yes     Comment: rare  . Drug Use: No  . Sexual Activity: Not on file   Other Topics Concern  . Not on file   Social History Narrative   Holter monitor 08/2010: PVCs and sinus tachy.   Sleep Study (02/2008): mild sleep apnea, no indication for CPAP.    Review of systems: The patient specifically denies any chest pain with exertion, dyspnea at rest or with exertion, orthopnea, paroxysmal nocturnal dyspnea, syncope, palpitations, focal neurological deficits, intermittent claudication, lower extremity edema, unexplained weight gain, cough, hemoptysis or wheezing.  The patient also denies abdominal pain, nausea, vomiting, dysphagia, diarrhea,  constipation, polyuria, polydipsia, dysuria, hematuria, frequency, urgency, abnormal bleeding or bruising, fever, chills, unexpected weight changes, mood swings, change in skin or hair texture, change in voice quality, auditory or visual problems, allergic reactions or rashes, new musculoskeletal complaints other than usual "aches and pains". He is limited by chronic low back pain    PHYSICAL EXAM BP 120/80 mmHg  Pulse 84  Resp 16  Ht 5\' 8"  (1.727 m)  Wt 223 lb 3.2 oz (101.243 kg)  BMI 33.95 kg/m2  General: Alert, oriented x3, no distress Head: no evidence of trauma, PERRL, EOMI, no exophtalmos or lid lag, no myxedema, no xanthelasma; normal ears, nose and oropharynx Neck: normal jugular venous pulsations and no hepatojugular reflux; brisk carotid pulses without delay and no carotid bruits Chest: clear to auscultation, no signs of consolidation by percussion or palpation, normal fremitus, symmetrical and full respiratory excursions Cardiovascular: normal position and quality of the apical impulse, regular rhythm, normal first and second heart sounds, no murmurs, rubs or gallops Abdomen: no tenderness or distention, no masses by palpation, no abnormal pulsatility or arterial bruits, normal bowel sounds, no hepatosplenomegaly Extremities: no clubbing, cyanosis or edema; 2+ radial, ulnar and brachial pulses bilaterally; 2+ right femoral, posterior tibial and dorsalis pedis pulses; 2+ left femoral, posterior tibial and dorsalis pedis pulses; no subclavian or femoral bruits Neurological: grossly nonfocal   EKG:  sinus rhythm, incomplete right bundle branch block, roughly 1.5 mm ST segment elevation in lead V2, upwardly concave, less than 1 mm ST elevation in lead V3. A repeat tracing performed with T1 and V2 in the second intercostal space shows roughly 1-1.5 mm ST elevation isolated to lead V2. Lipid Panel     Component Value Date/Time   CHOL 230* 08/20/2014 1036   TRIG * 08/20/2014 1036     679.0 Triglyceride is over 400; calculations on Lipids are invalid.   HDL 27.50* 08/20/2014 1036   CHOLHDL 8 08/20/2014  1036   VLDL 135.8* 08/20/2014 1036   LDLCALC NOT CALC 11/20/2013 1150   LDLDIRECT 59.4 08/20/2014 1036    BMET    Component Value Date/Time   NA 136 10/13/2014 0944   K 4.0 10/13/2014 0944   CL 100 10/13/2014 0944   CO2 27 10/13/2014 0944   GLUCOSE 128* 10/13/2014 0944   BUN 12 10/13/2014 0944   CREATININE 0.86 10/13/2014 0944   CREATININE 0.87 01/08/2014 0844   CALCIUM 9.5 10/13/2014 0944   GFRNONAA >89 11/20/2013 1150   GFRNONAA >90 04/10/2012 1721   GFRAA >89 11/20/2013 1150   GFRAA >90 04/10/2012 1721     ASSESSMENT AND PLAN His electrocardiographic changes are not typical, but are concerning for possible Brugada syndrome. Absent the history of syncope, the ECG would probably not warrant further investigation, but since he has a history suggestive of arrhythmia, I recommended EP consultation. Consider flecainide challenge.  I suspect his chest discomfort is not coronary in etiology. It is reasonable however to perform a nuclear perfusion study. He is unable to exercise on the treadmill. Also reevaluate the degree of left ventricular hypertrophy by echo.  Orders Placed This Encounter  Procedures  . Ambulatory referral to Cardiac Electrophysiology  . Myocardial Perfusion Imaging  . EKG 12-Lead  . 2D Echocardiogram without contrast   No orders of the defined types were placed in this encounter.    Holli Humbles, MD, [place] [phone] office [phone] pager

## 2014-10-23 ENCOUNTER — Encounter: Payer: Self-pay | Admitting: Internal Medicine

## 2014-10-23 ENCOUNTER — Ambulatory Visit (HOSPITAL_COMMUNITY)
Admission: RE | Admit: 2014-10-23 | Discharge: 2014-10-23 | Disposition: A | Discharge status: Home / Self Care | Payer: Medicare Other | Source: Ambulatory Visit | Attending: Internal Medicine | Admitting: Internal Medicine

## 2014-10-23 DIAGNOSIS — R062 Wheezing: Secondary | ICD-10-CM

## 2014-10-23 DIAGNOSIS — R0602 Shortness of breath: Secondary | ICD-10-CM

## 2014-10-23 DIAGNOSIS — R0789 Other chest pain: Secondary | ICD-10-CM

## 2014-10-23 DIAGNOSIS — R0600 Dyspnea, unspecified: Secondary | ICD-10-CM | POA: Insufficient documentation

## 2014-10-23 MED ORDER — PERFLUTREN LIPID MICROSPHERE
1.0000 mL | INTRAVENOUS | Status: AC | PRN
  Administered 2014-10-23: 2 mL via INTRAVENOUS

## 2014-10-23 NOTE — Progress Notes (Addendum)
2D Echo Performed 10/23/2014    Cody Raymond, RCS Definity used . BP after 133/73 , 30 mins. BP 129/90

## 2014-10-23 NOTE — Progress Notes (Signed)
Cody Raymond was seen in the office today his echocardiogram. He is complaining of chest pain which is worse in the chest wall. It was deftly positional. I repeated an EKG with the V1 and V2 leads in the second intercostal space to compare directly to his prior EKG. He has been diagnosed with Brugada syndrome. I do not see any acute EKG changes compared to his prior EKG. The chest pain does not sound like angina. He reports his symptoms are resolving. He's okay to be discharged and should have his additional stress testing and follow-up with Dr. Drue Stager, MD, [facility] Attending Cardiologist [place]

## 2014-10-28 ENCOUNTER — Other Ambulatory Visit: Payer: Self-pay | Admitting: *Deleted

## 2014-10-28 DIAGNOSIS — R079 Chest pain, unspecified: Secondary | ICD-10-CM

## 2014-10-28 NOTE — Progress Notes (Signed)
Patient was having an echo and complained of chest pain.  Please see Dr Lysbeth Penner note for more information

## 2014-10-31 ENCOUNTER — Encounter (HOSPITAL_COMMUNITY): Payer: Self-pay

## 2014-11-03 ENCOUNTER — Telehealth: Payer: Self-pay | Admitting: *Deleted

## 2014-11-03 MED ORDER — METFORMIN HCL ER 500 MG PO TB24: 500.0000 mg | ORAL_TABLET | Freq: Every day | ORAL | Status: DC

## 2014-11-03 MED ORDER — KETOCONAZOLE 2 % EX SHAM: 1.0000 "application " | MEDICATED_SHAMPOO | Freq: Every day | CUTANEOUS | Status: DC

## 2014-11-03 MED ORDER — ALBUTEROL SULFATE (2.5 MG/3ML) 0.083% IN NEBU: 2.5000 mg | INHALATION_SOLUTION | Freq: Four times a day (QID) | RESPIRATORY_TRACT | Status: DC | PRN

## 2014-11-03 MED ORDER — LISINOPRIL 2.5 MG PO TABS: 2.5000 mg | ORAL_TABLET | Freq: Every day | ORAL | Status: DC

## 2014-11-03 MED ORDER — METHOCARBAMOL 500 MG PO TABS: 500.0000 mg | ORAL_TABLET | Freq: Every evening | ORAL | Status: DC | PRN

## 2014-11-03 NOTE — Telephone Encounter (Signed)
Received fax from [facility] requesting new rxs for:  Lisinopril, metformin, methocarbamol, albuterol nebulizer, nizoral shampoo and clonazepam.  Spoke with pt, he states he took printed Rxs from 10/13/14 to [facility] and insurance will not pay for Rxs at a local pharmacy. Pt needs Rxs sent to [facility]. Advised pt that NP is not able to do more than 30 day supply of clonazepam. He voices understanding and states he is going to schedule appt with therapist and will have them to manage this Rx.  All other refills sent.

## 2014-11-04 ENCOUNTER — Ambulatory Visit (INDEPENDENT_AMBULATORY_CARE_PROVIDER_SITE_OTHER): Payer: Medicare Other | Admitting: Internal Medicine

## 2014-11-04 ENCOUNTER — Encounter: Payer: Self-pay | Admitting: Internal Medicine

## 2014-11-04 VITALS — BP 104/72 | HR 92 | Ht 68.0 in | Wt 223.8 lb

## 2014-11-04 DIAGNOSIS — Q248 Other specified congenital malformations of heart: Secondary | ICD-10-CM

## 2014-11-04 DIAGNOSIS — I498 Other specified cardiac arrhythmias: Secondary | ICD-10-CM

## 2014-11-04 NOTE — Patient Instructions (Signed)
Dr. Caryl Comes will check with other physicians about possibility of MRI testing.   Dr. Caryl Comes will see you on an as needed basis.

## 2014-11-04 NOTE — Progress Notes (Signed)
ELECTROPHYSIOLOGY CONSULT NOTE  Patient ID: Cody Raymond, MRN: [id number], DOB/AGE: May 30, 1971 44 y.o. Admit date: (Not on file) Date of Consult: 11/04/2014  Primary Physician: Nance Pear., NP Primary Cardiologist: [facility]  Chief Complaint: syncope and abnormal ECG   HPI Cody Raymond is a 44 y.o. male  Referred for evaluation of syncope and abnormal ECG  His cardiac history is notable for a PDA repair at age 88. Catheterization 2011 demonstrated mild atherosclerosis and was some coronary artery spasm.  No significant aortic pathology identified. There is very minimal dilatation of the proximal descending thoracic aorta which is not associated with collateral vessels or significant coarctation. No other aneurysmal disease or dissection is present.   His mother is a history of hypertrophic obstructive cardiomyopathy with previous septal myectomy.  The patient's echocardiogram demonstrated   concentric LVH with dimensions of 12/13  Mm left atrial dimension was normal  ECG obtained 10/23/14 with V1 and V2 and the second intercostal space and was treated a type II Brugada pattern in lead V2 and an RSR prime in lead V1  He has a history of exercise intolerance  more concerning he had an episode of syncope about 3 years ago. He was walking across his house. Without warning he fell and broke a coffee table. There is no antecedent or recurrent history of syncope.  There is no family history of premature death     Past Medical History  Diagnosis Date  . Diabetes mellitus   . Asthma   . Enlarged prostate   . Hyperlipidemia   . Heart disease   . Hypertension   . Blood transfusion   . GERD (gastroesophageal reflux disease)   . Refusal of blood transfusions as patient is Jehovah's Witness   . Neuromuscular disorder     DJD  . Coronary artery disease   . CAD (coronary artery disease), non obstructive on cath 2011 04/05/2012  . H/O syncope       Surgical History:    Past Surgical History  Procedure Laterality Date  . Appendectomy    . Patent ductus arterious repair      at age 45  . Colonoscopy w/ polypectomy  03/17/11    diminutive polyp  . Cardiac catheterization  2007    Clean Cardiac Cath [facility]), with RCA 30% narrowing likely due to catheter induced spasm in 09/02/10.  . Back surgery    . Cardiac catheterization  09/02/2010    mod. nonobstructive disease in the RCA and CX, tortuous LAD  . Nm myocar perf wall motion  01/25/2008    mild anteroapical wall ischemia  . Loop recorder implant  04/06/2012    Reveal XT 4529  . Loop recorder implant N/A 04/06/2012    Procedure: LOOP RECORDER IMPLANT;  Surgeon: Sanda Maylin [place], MD;  Location: [place] CATH LAB;  Service: Cardiovascular;  Laterality: N/A;  . Loop recorder explant N/A 01/14/2014    Procedure: LOOP RECORDER EXPLANT;  Surgeon: Sanda Comer Devins, MD;  Location: [place] CATH LAB;  Service: Cardiovascular;  Laterality: N/A;     Home Meds: Prior to Admission medications   Medication Sig Start Date End Date Taking? Authorizing Provider  albuterol (PROVENTIL HFA;VENTOLIN HFA) 108 (90 BASE) MCG/ACT inhaler Inhale 2 puffs into the lungs every 6 (six) hours as needed for wheezing or shortness of breath. Use with spacer, for asthma 03/07/13  Yes [name], NP  albuterol (PROVENTIL) (2.5 MG/3ML) 0.083% nebulizer solution Take 3 mLs (2.5 mg total) by nebulization every 6 (six) hours  as needed for wheezing or shortness of breath. For wheezing 11/03/14  Yes [name], NP  aspirin 325 MG tablet Take 325 mg by mouth daily.   Yes Historical Provider, MD  budesonide-formoterol (SYMBICORT) 160-4.5 MCG/ACT inhaler Inhale 2 puffs into the lungs 2 (two) times daily. 10/13/14  Yes [name], NP  cetirizine (ZYRTEC) 10 MG tablet Take 10 mg by mouth daily.   Yes Historical Provider, MD  clonazePAM (KLONOPIN) 1 MG tablet Take 1 mg by mouth daily.  08/20/13  Yes Historical Provider, MD  diclofenac  sodium (VOLTAREN) 1 % GEL Apply 2 g topically 4 (four) times daily. 11/01/13  Yes Charlett Blake, MD  fenofibrate (TRICOR) 145 MG tablet Take 1 tablet (145 mg total) by mouth daily. 10/13/14  Yes [name], NP  fish oil-omega-3 fatty acids 1000 MG capsule Take 2 g by mouth 2 (two) times daily.    Yes Historical Provider, MD  fluocinonide gel (LIDEX) 0.05 % Apply sparingly to scalp twice daily as needed 10/13/14  Yes [name], NP  gabapentin (NEURONTIN) 300 MG capsule Take 300 mg by mouth at 6 am, take 300 mg by mouth at 11:30 am, take 300 mg by mouth at 4:30 pm, take 600 mg by mouth at 10 pm. 10/13/14  Yes [name], NP  ketoconazole (NIZORAL) 2 % shampoo Apply 1 application topically daily. 11/03/14  Yes [name], NP  lansoprazole (PREVACID) 15 MG capsule Take 15 mg by mouth daily.   Yes Historical Provider, MD  lisinopril (PRINIVIL,ZESTRIL) 2.5 MG tablet Take 1 tablet (2.5 mg total) by mouth daily. 11/03/14  Yes [name], NP  metFORMIN (GLUCOPHAGE XR) 500 MG 24 hr tablet Take 1 tablet (500 mg total) by mouth daily with breakfast. 11/03/14  Yes [name], NP  methocarbamol (ROBAXIN) 500 MG tablet Take 1 tablet (500 mg total) by mouth at bedtime and may repeat dose one time if needed. 11/03/14  Yes [name], NP  montelukast (SINGULAIR) 10 MG tablet Take 1 tablet (10 mg total) by mouth at bedtime. 10/13/14  Yes [name], NP  naproxen sodium (ALEVE) 220 MG tablet Take 440 mg by mouth 2 (two) times daily. Take 440 mg in the morning and 220 mg in the evening.   Yes Historical Provider, MD  nitroGLYCERIN (NITROSTAT) 0.4 MG SL tablet Place 1 tablet (0.4 mg total) under the tongue every 5 (five) minutes as needed for chest pain. 10/13/14  Yes [name], NP  ramelteon (ROZEREM) 8 MG tablet Take 8 mg by mouth at bedtime. 07/23/12  Yes Burnice Logan, MD  rosuvastatin (CRESTOR) 40 MG tablet Take 1 tablet (40 mg total) by mouth daily.  10/13/14  Yes [name], NP  sertraline (ZOLOFT) 50 MG tablet Take 1 tablet (50 mg total) by mouth daily. 10/13/14  Yes [name], NP  tamsulosin (FLOMAX) 0.4 MG CAPS capsule Take 1 capsule (0.4 mg total) by mouth daily. 10/13/14  Yes [name], NP  traMADol-acetaminophen (ULTRACET) 37.5-325 MG per tablet Take 1-2 tablets by mouth every 8 (eight) hours as needed for severe pain. 08/26/14  Yes Charlett Blake, MD  traZODone (DESYREL) 100 MG tablet Take 1 tablet (100 mg total) by mouth at bedtime. 10/13/14  Yes [name], NP  zolpidem (AMBIEN) 10 MG tablet Take 1 tablet (10 mg total) by mouth at bedtime as needed for sleep. 10/13/14  Yes [name], NP       Allergies:  Allergies  Allergen Reactions  . Benadryl [Diphenhydramine  Hcl]     "drives me nuts"  . Red Blood Cells     PT. REFUSES ANY BLOOD PRODUCTS - JEHOVAH'S WITNESS.    History   Social History  . Marital Status: Married    Spouse Name: N/A  . Number of Children: N/A  . Years of Education: N/A   Occupational History  . Not on file.   Social History Main Topics  . Smoking status: Never Smoker   . Smokeless tobacco: Never Used  . Alcohol Use: Yes     Comment: rare  . Drug Use: No  . Sexual Activity: Not on file   Other Topics Concern  . Not on file   Social History Narrative   Holter monitor 08/2010: PVCs and sinus tachy.   Sleep Study (02/2008): mild sleep apnea, no indication for CPAP.     Family History  Problem Relation Age of Onset  . Colon cancer Paternal Grandfather   . Prostate cancer Paternal Grandfather   . Aneurysm Father   . Heart attack Father 46  . Coronary artery disease Father   . Colon cancer Paternal Uncle     x 2  . Colon polyps Mother   . Heart disease Mother   . Coronary artery disease Mother   . Breast cancer Maternal Grandmother   . Stomach cancer Brother   . Liver disease      unsure who it was  . Allergies Daughter      ROS:  Please  see the history of present illness.     All other systems reviewed and negative.    Physical Exam:   Blood pressure 104/72, pulse 92, height 5\' 8"  (1.727 m), weight 223 lb 12.8 oz (101.515 kg). General: Well developed, well nourished male in no acute distress. Head: Normocephalic, atraumatic, sclera non-icteric, no xanthomas, nares are without discharge. EENT: normal Lymph Nodes:  none Back: without scoliosis/kyphosis , no CVA tendersness Neck: Negative for carotid bruits. JVD not elevated. Lungs: Clear bilaterally to auscultation without wheezes, rales, or rhonchi. Breathing is unlabored. Heart: RRR with S1 S2. No murmur , rubs, or gallops appreciated. Abdomen: Soft, non-tender, non-distended with normoactive bowel sounds. No hepatomegaly. No rebound/guarding. No obvious abdominal masses. Msk:  Strength and tone appear normal for age. Extremities: No clubbing or cyanosis. No edema.  Distal pedal pulses are 2+ and equal bilaterally. Skin: Warm and Dry Neuro: Alert and oriented X 3. CN III-XII intact Grossly normal sensory and motor function . Psych:  Responds to questions appropriately with a normal affect.      Labs: Cardiac Enzymes No results for input(s): CKTOTAL, CKMB, TROPONINI in the last 72 hours. CBC Lab Results  Component Value Date   WBC 4.8 01/08/2014   HGB 14.1 01/08/2014   HCT 40.5 01/08/2014   MCV 75.8* 01/08/2014   PLT 262 01/08/2014   PROTIME: No results for input(s): LABPROT, INR in the last 72 hours. Chemistry No results for input(s): NA, K, CL, CO2, BUN, CREATININE, CALCIUM, PROT, BILITOT, ALKPHOS, ALT, AST, GLUCOSE in the last 168 hours.  Invalid input(s): LABALBU Lipids Lab Results  Component Value Date   CHOL 230* 08/20/2014   HDL 27.50* 08/20/2014   LDLCALC NOT CALC 11/20/2013   TRIG * 08/20/2014    679.0 Triglyceride is over 400; calculations on Lipids are invalid.   BNP PRO B NATRIURETIC PEPTIDE (BNP)  Date/Time Value Ref Range Status    09/02/2010 05:30 AM <30.0 0.0 - 100.0 pg/mL Final   Miscellaneous Lab Results  Component Value Date   DDIMER  03/05/2008    0.37        AT THE INHOUSE ESTABLISHED CUTOFF VALUE OF 0.48 ug/mL FEU, THIS ASSAY HAS BEEN DOCUMENTED IN THE LITERATURE TO HAVE    Radiology/Studies:  No results found.  EKG:  Sinus rhythm at 92 Intervals 14/11/34 Incomplete right bundle branch block  Assessment and Plan:   Syncope  Left ventricular hypertrophy with a family history of hypertrophic obstructive cardiomyopathy  Abnormal ECG  Dyspnea on exertion  The patient has syncope in the context of an ECG suggestive of a type II Brugada pattern at least in V2 However, with the family history of hypertrophic obstructive cardio myopathy reported by a history of myectomy it seems unlikely that there are 2 unusual cardiac disorders occurring in his family.   I would therefore be inclined to think that the ECG is nonspecific. I am much more concerned with his 13 mm walls given his non-Caucasian White ethnicity  which makes this degree of hypertrophy exceedingly unlikely (less than 1%) and in the context of his mother's history makes HCM much more likely.  Unfortunately he suffers from claustrophobia and so we will have to struggle to figure out a way to do an MRI scan.   His syncope history is concerning either in the context of Brugada or the context of HCM although the risk in the latter situation is attenuated by the interval since his last event.  Most useful distinguishing test would be MRI  Cannot   Virl Axe

## 2014-11-06 ENCOUNTER — Other Ambulatory Visit: Payer: Self-pay | Admitting: *Deleted

## 2014-11-06 MED ORDER — MONTELUKAST SODIUM 10 MG PO TABS: 10.0000 mg | ORAL_TABLET | Freq: Every day | ORAL | Status: DC

## 2014-11-06 MED ORDER — FENOFIBRATE 145 MG PO TABS
145.0000 mg | ORAL_TABLET | Freq: Every day | ORAL | Status: DC
Start: 2014-11-06 — End: 2015-03-09

## 2014-11-06 MED ORDER — GABAPENTIN 300 MG PO CAPS: ORAL_CAPSULE | ORAL | Status: DC

## 2014-11-06 MED ORDER — SERTRALINE HCL 50 MG PO TABS: 50.0000 mg | ORAL_TABLET | Freq: Every day | ORAL | Status: DC

## 2014-11-06 MED ORDER — TRAZODONE HCL 100 MG PO TABS: 100.0000 mg | ORAL_TABLET | Freq: Every day | ORAL | Status: DC

## 2014-11-06 MED ORDER — TAMSULOSIN HCL 0.4 MG PO CAPS: 0.4000 mg | ORAL_CAPSULE | Freq: Every day | ORAL | Status: DC

## 2014-11-07 ENCOUNTER — Telehealth (HOSPITAL_COMMUNITY): Payer: Self-pay

## 2014-11-07 NOTE — Telephone Encounter (Signed)
Encounter complete. 

## 2014-11-10 NOTE — Telephone Encounter (Signed)
Rxs faxed to [facility] and gave verbal re: supervising MD info.

## 2014-11-11 ENCOUNTER — Telehealth: Payer: Self-pay | Admitting: *Deleted

## 2014-11-11 DIAGNOSIS — I517 Cardiomegaly: Secondary | ICD-10-CM

## 2014-11-11 DIAGNOSIS — Z01812 Encounter for preprocedural laboratory examination: Secondary | ICD-10-CM

## 2014-11-11 NOTE — Telephone Encounter (Signed)
Called patient to inform him that Dr. Caryl Comes would like to proceed with MRI testing. Explained that he had one many years ago.  Also informed him this is open MRI testing. He is comfortable with that along with Valium. I told him I would order pill (Valium 10 mg) once MRI scheduled and that I would call him to arrange lab work prior to date. Patient is agreeable to trying.

## 2014-11-12 ENCOUNTER — Ambulatory Visit (HOSPITAL_COMMUNITY)
Admission: RE | Admit: 2014-11-12 | Discharge: 2014-11-12 | Disposition: A | Discharge status: Home / Self Care | Payer: Medicare Other | Source: Ambulatory Visit | Attending: Cardiology | Admitting: Cardiology

## 2014-11-12 DIAGNOSIS — E119 Type 2 diabetes mellitus without complications: Secondary | ICD-10-CM | POA: Diagnosis not present

## 2014-11-12 DIAGNOSIS — R0789 Other chest pain: Secondary | ICD-10-CM

## 2014-11-12 DIAGNOSIS — R110 Nausea: Secondary | ICD-10-CM | POA: Diagnosis not present

## 2014-11-12 DIAGNOSIS — R002 Palpitations: Secondary | ICD-10-CM | POA: Insufficient documentation

## 2014-11-12 DIAGNOSIS — E785 Hyperlipidemia, unspecified: Secondary | ICD-10-CM | POA: Diagnosis not present

## 2014-11-12 DIAGNOSIS — R5383 Other fatigue: Secondary | ICD-10-CM | POA: Insufficient documentation

## 2014-11-12 DIAGNOSIS — I10 Essential (primary) hypertension: Secondary | ICD-10-CM | POA: Insufficient documentation

## 2014-11-12 DIAGNOSIS — R0609 Other forms of dyspnea: Secondary | ICD-10-CM | POA: Diagnosis not present

## 2014-11-12 DIAGNOSIS — R079 Chest pain, unspecified: Secondary | ICD-10-CM | POA: Insufficient documentation

## 2014-11-12 DIAGNOSIS — Z8249 Family history of ischemic heart disease and other diseases of the circulatory system: Secondary | ICD-10-CM | POA: Diagnosis not present

## 2014-11-12 DIAGNOSIS — R42 Dizziness and giddiness: Secondary | ICD-10-CM | POA: Diagnosis not present

## 2014-11-12 DIAGNOSIS — E669 Obesity, unspecified: Secondary | ICD-10-CM | POA: Insufficient documentation

## 2014-11-12 DIAGNOSIS — R9431 Abnormal electrocardiogram [ECG] [EKG]: Secondary | ICD-10-CM | POA: Insufficient documentation

## 2014-11-12 MED ORDER — TECHNETIUM TC 99M SESTAMIBI GENERIC - CARDIOLITE
31.0000 | Freq: Once | INTRAVENOUS | Status: AC | PRN
  Administered 2014-11-12: 31 via INTRAVENOUS

## 2014-11-12 MED ORDER — REGADENOSON 0.4 MG/5ML IV SOLN
0.4000 mg | Freq: Once | INTRAVENOUS | Status: AC
  Administered 2014-11-12: 0.4 mg via INTRAVENOUS

## 2014-11-12 MED ORDER — TECHNETIUM TC 99M SESTAMIBI GENERIC - CARDIOLITE
10.5000 | Freq: Once | INTRAVENOUS | Status: AC | PRN
  Administered 2014-11-12: 11 via INTRAVENOUS

## 2014-11-12 MED ORDER — AMINOPHYLLINE 25 MG/ML IV SOLN
75.0000 mg | Freq: Once | INTRAVENOUS | Status: AC
  Administered 2014-11-12: 75 mg via INTRAVENOUS

## 2014-11-12 NOTE — Procedures (Addendum)
[place] [address] [place] [address] [phone]  Cardiology Nuclear Med Study  Cody Raymond is a 44 y.o. male     MRN : [id number]     DOB: Feb 06, 1971  Procedure Date: 11/12/2014  Nuclear Med Background Indication for Stress Test:  Evaluation for Ischemia, Abnormal EKG and Brugada syndrome History:  Asthma and CAD-2011;Incomplete RBBB;Palpitations;Last NUC MPI on 01/25/2008-ischemic;EF=62% Cardiac Risk Factors: CVA, Family History - CAD, Hypertension, Lipids, NIDDM and Obesity  Symptoms:  Chest Pain, Dizziness, DOE, Fatigue, Light-Headedness, Nausea, Palpitations and SOB   Nuclear Pre-Procedure Caffeine/Decaff Intake:  9:00pm NPO After: 7:00am   IV Site: R Forearm  IV 0.9% NS with Angio Cath:  22g  Chest Size (in):  48" IV Started by: Rolene Course, RN  Height: 5\' 8"  (1.727 m)  Cup Size: n/a  BMI:  Body mass index is 33.91 kg/(m^2). Weight:  223 lb (101.152 kg)   Tech Comments:  n/a    Nuclear Med Study 1 or 2 day study: 1 day  Stress Test Type:  [place] Provider:  Sanda Klein, MD   Resting Radionuclide: Technetium 75m Sestamibi  Resting Radionuclide Dose: 10.5 mCi   Stress Radionuclide:  Technetium 27m Sestamibi  Stress Radionuclide Dose: 31.0 mCi           Stress Protocol Rest HR: 108 Stress HR: 136  Rest BP: 117/87 Stress BP: 112/83  Exercise Time (min): n/a METS: n/a   Predicted Max HR: 177 bpm % Max HR: 76.84 bpm Rate Pressure Product: 16592  Dose of Adenosine (mg):  n/a Dose of Lexiscan: 0.4 mg  Dose of Atropine (mg): n/a Dose of Dobutamine: n/a mcg/kg/min (at max HR)  Stress Test Technologist: Leane Para, CCT Nuclear Technologist: Imagene Riches, CNMT   Rest Procedure:  Myocardial perfusion imaging was performed at rest 45 minutes following the intravenous administration of Technetium 18m Sestamibi. Stress Procedure:  The patient received IV Lexiscan 0.4 mg over  15-seconds.  Technetium 69m Sestamibi injected at 30-seconds.  Patient experienced SOB and Chest Heaviness and 75 mg Aminophylline IV was administered. There were no significant changes with Lexiscan.  Quantitative spect images were obtained after a 45 minute delay.  Transient Ischemic Dilatation (Normal <1.22):  1.04 QGS EDV:  74 ml QGS ESV:  27 ml LV Ejection Fraction: 63%    Rest ECG: NSR, IRBBB  Stress ECG: No significant ST segment change suggestive of ischemia.  QPS Raw Data Images:  Acquisition technically good; normal left ventricular size. Stress Images:  There is decreased uptake in the apex. Rest Images:  There is decreased uptake in the apex. Subtraction (SDS):  No evidence of ischemia.  Impression Exercise Capacity:  Lexiscan with low level exercise. BP Response:  Normal blood pressure response. Clinical Symptoms:  There is chest pain and dyspnea ECG Impression:  No significant ST segment change suggestive of ischemia. Comparison with Prior Nuclear Study: Compared to 01/25/08, description of defect appears to be similar but no ischemia at present  Overall Impression:  Low risk stress nuclear study with a small, moderate intensity, fixed distal anterior/apical defect suggestive of prominent thinning vs soft tissue attenuation; no significant ischemia.  LV Wall Motion:  NL LV Function; NL Wall Motion   Kirk Ruths, MD  11/12/2014 1:34 PM

## 2014-11-13 ENCOUNTER — Encounter: Payer: Self-pay | Admitting: Internal Medicine

## 2014-11-17 ENCOUNTER — Encounter: Payer: Self-pay | Admitting: Physical Medicine & Rehabilitation

## 2014-11-17 ENCOUNTER — Encounter: Payer: Medicare Other | Attending: Physical Medicine & Rehabilitation

## 2014-11-17 ENCOUNTER — Ambulatory Visit (HOSPITAL_BASED_OUTPATIENT_CLINIC_OR_DEPARTMENT_OTHER): Payer: Medicare Other | Admitting: Physical Medicine & Rehabilitation

## 2014-11-17 VITALS — BP 109/71 | HR 88 | Resp 14

## 2014-11-17 DIAGNOSIS — M961 Postlaminectomy syndrome, not elsewhere classified: Secondary | ICD-10-CM

## 2014-11-17 DIAGNOSIS — M5416 Radiculopathy, lumbar region: Secondary | ICD-10-CM

## 2014-11-17 MED ORDER — TRAMADOL HCL 50 MG PO TABS: 50.0000 mg | ORAL_TABLET | Freq: Four times a day (QID) | ORAL | Status: DC | PRN

## 2014-11-17 NOTE — Patient Instructions (Signed)
Please remember that the tramadol is stronger than the Ultracet. He may need to only take one half tablet during the day if this makes her drowsy.

## 2014-11-17 NOTE — Progress Notes (Signed)
Subjective:    Patient ID: Cody Raymond, male    DOB: 12-26-1970, 44 y.o.   MRN: [id number]  HPI Cody Raymond a pop after getting off the examination table during cardiology visit. Had some exacerbation of pain which was temporary with this No new lower extremity symptoms No new bowel or bladder symptoms  Foot pain improved with diabetic shoes  As well as shoe inserts  Pain Inventory Average Pain 9 Pain Right Now 8 My pain is constant, sharp, burning, dull, stabbing, tingling and aching  In the last 24 hours, has pain interfered with the following? General activity 8 Relation with others 7 Enjoyment of life 8 What TIME of day is your pain at its worst? all Sleep (in general) Poor  Pain is worse with: walking, bending, sitting and standing Pain improves with: rest and heat/ice Relief from Meds: 3  Mobility walk with assistance use a cane ability to climb steps?  no do you drive?  yes  Function disabled: date disabled .  Neuro/Psych weakness numbness tremor tingling spasms dizziness confusion depression anxiety  Prior Studies Any changes since last visit?  no  Physicians involved in your care Any changes since last visit?  no   Family History  Problem Relation Age of Onset  . Colon cancer Paternal Grandfather   . Prostate cancer Paternal Grandfather   . Aneurysm Father   . Heart attack Father 76  . Coronary artery disease Father   . Colon cancer Paternal Uncle     x 2  . Colon polyps Mother   . Heart disease Mother   . Coronary artery disease Mother   . Breast cancer Maternal Grandmother   . Stomach cancer Brother   . Liver disease      unsure who it was  . Allergies Daughter    History   Social History  . Marital Status: Married    Spouse Name: N/A  . Number of Children: N/A  . Years of Education: N/A   Social History Main Topics  . Smoking status: Never Smoker   . Smokeless tobacco: Never Used  . Alcohol Use: Yes     Comment: rare  .  Drug Use: No  . Sexual Activity: Not on file   Other Topics Concern  . None   Social History Narrative   Holter monitor 08/2010: PVCs and sinus tachy.   Sleep Study (02/2008): mild sleep apnea, no indication for CPAP.   Past Surgical History  Procedure Laterality Date  . Appendectomy    . Patent ductus arterious repair      at age 30  . Colonoscopy w/ polypectomy  03/17/11    diminutive polyp  . Cardiac catheterization  2007    Clean Cardiac Cath [facility]), with RCA 30% narrowing likely due to catheter induced spasm in 09/02/10.  . Back surgery    . Cardiac catheterization  09/02/2010    mod. nonobstructive disease in the RCA and CX, tortuous LAD  . Nm myocar perf wall motion  01/25/2008    mild anteroapical wall ischemia  . Loop recorder implant  04/06/2012    Reveal XT 4529  . Loop recorder implant N/A 04/06/2012    Procedure: LOOP RECORDER IMPLANT;  Surgeon: Sanda Klein, MD;  Location: [place] CATH LAB;  Service: Cardiovascular;  Laterality: N/A;  . Loop recorder explant N/A 01/14/2014    Procedure: LOOP RECORDER EXPLANT;  Surgeon: Sanda Klein, MD;  Location: [place] CATH LAB;  Service: Cardiovascular;  Laterality: N/A;  Past Medical History  Diagnosis Date  . Diabetes mellitus   . Asthma   . Enlarged prostate   . Hyperlipidemia   . Heart disease   . Hypertension   . Blood transfusion   . GERD (gastroesophageal reflux disease)   . Refusal of blood transfusions as patient is Jehovah's Witness   . Neuromuscular disorder     DJD  . Coronary artery disease   . CAD (coronary artery disease), non obstructive on cath 2011 04/05/2012  . H/O syncope    BP 109/71 mmHg  Pulse 88  Resp 14  SpO2 97%  Opioid Risk Score:   Fall Risk Score:    Review of Systems  Respiratory: Positive for cough, shortness of breath and wheezing.   Gastrointestinal: Positive for nausea and abdominal pain.  Endocrine:       High blood sugar  Neurological: Positive for dizziness, tremors,  weakness and numbness.       Tingling Spasms   Psychiatric/Behavioral: Positive for confusion and dysphoric mood. The patient is nervous/anxious.   All other systems reviewed and are negative.      Objective:   Physical Exam  Constitutional: He is oriented to person, place, and time. He appears well-developed and well-nourished.  HENT:  Head: Normocephalic and atraumatic.  Eyes: Conjunctivae and EOM are normal. Pupils are equal, round, and reactive to light.  Neurological: He is alert and oriented to person, place, and time. He has normal strength. A sensory deficit is present. Gait abnormal.  Psychiatric: He has a normal mood and affect.  Nursing note and vitals reviewed.  Tenderness to palpation over the right hip trochanteric bursa area. Lumbar spine has tenderness over the paraspinal muscles more on the right side than left side Sensation reduced in the right L5 and right S1 nerve distribution. Normal in the left side. Deep tendon reflexes are 2+ bilateral knees and 1+ bilateral ankle       Assessment & Plan:  1.  Chronic radiculopathy continue gabapentin 300 mg 3 times a day and 600 mg at night  We also discussed the Ultracet being a brand name medication and very expensive with his new insurance. We discuss treatment alternatives including tramadol 50 mg 1 tablet 4 times per day. During the day he may have to cut 1 of the tablets in half to avoid drowsiness.  2. Right greater than left trochanteric bursitis he had about 2 weeks relief with the trochanteric bursa injection without ultrasound guidance. We discuss trying it with ultrasound guidance to hopefully increased duration of response 3. Probable plantar fasciitis improved with change in shoe wear

## 2014-11-20 ENCOUNTER — Other Ambulatory Visit (INDEPENDENT_AMBULATORY_CARE_PROVIDER_SITE_OTHER): Payer: Medicare Other | Admitting: *Deleted

## 2014-11-20 DIAGNOSIS — I517 Cardiomegaly: Secondary | ICD-10-CM

## 2014-11-20 DIAGNOSIS — Z01812 Encounter for preprocedural laboratory examination: Secondary | ICD-10-CM

## 2014-11-20 LAB — BASIC METABOLIC PANEL
BUN: 15 mg/dL (ref 6–23)
CALCIUM: 9.1 mg/dL (ref 8.4–10.5)
CO2: 33 mEq/L — ABNORMAL HIGH (ref 19–32)
Chloride: 101 mEq/L (ref 96–112)
Creatinine, Ser: 0.99 mg/dL (ref 0.40–1.50)
GFR: 87.27 mL/min (ref >60.00)
Glucose, Bld: 113 mg/dL — ABNORMAL HIGH (ref 70–99)
Potassium: 3.8 mEq/L (ref 3.5–5.1)
SODIUM: 136 meq/L (ref 135–145)

## 2014-11-24 ENCOUNTER — Encounter: Payer: Self-pay | Admitting: Physical Medicine & Rehabilitation

## 2014-11-24 ENCOUNTER — Encounter: Payer: Medicare Other | Attending: Physical Medicine & Rehabilitation

## 2014-11-24 ENCOUNTER — Emergency Department (HOSPITAL_COMMUNITY)
Admission: EM | Admit: 2014-11-24 | Discharge: 2014-11-24 | Disposition: A | Discharge status: Home / Self Care | Payer: Medicare Other | Attending: Emergency Medicine | Admitting: Emergency Medicine

## 2014-11-24 ENCOUNTER — Ambulatory Visit (HOSPITAL_BASED_OUTPATIENT_CLINIC_OR_DEPARTMENT_OTHER): Payer: Medicare Other | Admitting: Physical Medicine & Rehabilitation

## 2014-11-24 ENCOUNTER — Encounter (HOSPITAL_COMMUNITY): Payer: Self-pay | Admitting: Emergency Medicine

## 2014-11-24 ENCOUNTER — Emergency Department (HOSPITAL_COMMUNITY): Payer: Medicare Other

## 2014-11-24 VITALS — BP 114/72 | HR 87

## 2014-11-24 DIAGNOSIS — M5416 Radiculopathy, lumbar region: Secondary | ICD-10-CM | POA: Insufficient documentation

## 2014-11-24 DIAGNOSIS — K219 Gastro-esophageal reflux disease without esophagitis: Secondary | ICD-10-CM | POA: Diagnosis not present

## 2014-11-24 DIAGNOSIS — G709 Myoneural disorder, unspecified: Secondary | ICD-10-CM | POA: Diagnosis not present

## 2014-11-24 DIAGNOSIS — E785 Hyperlipidemia, unspecified: Secondary | ICD-10-CM | POA: Insufficient documentation

## 2014-11-24 DIAGNOSIS — M961 Postlaminectomy syndrome, not elsewhere classified: Secondary | ICD-10-CM | POA: Insufficient documentation

## 2014-11-24 DIAGNOSIS — I2510 Atherosclerotic heart disease of native coronary artery without angina pectoris: Secondary | ICD-10-CM | POA: Insufficient documentation

## 2014-11-24 DIAGNOSIS — M545 Low back pain, unspecified: Secondary | ICD-10-CM

## 2014-11-24 DIAGNOSIS — I10 Essential (primary) hypertension: Secondary | ICD-10-CM | POA: Insufficient documentation

## 2014-11-24 DIAGNOSIS — E119 Type 2 diabetes mellitus without complications: Secondary | ICD-10-CM | POA: Insufficient documentation

## 2014-11-24 DIAGNOSIS — Z791 Long term (current) use of non-steroidal anti-inflammatories (NSAID): Secondary | ICD-10-CM | POA: Insufficient documentation

## 2014-11-24 DIAGNOSIS — Z9889 Other specified postprocedural states: Secondary | ICD-10-CM | POA: Insufficient documentation

## 2014-11-24 DIAGNOSIS — N400 Enlarged prostate without lower urinary tract symptoms: Secondary | ICD-10-CM | POA: Insufficient documentation

## 2014-11-24 DIAGNOSIS — M549 Dorsalgia, unspecified: Secondary | ICD-10-CM | POA: Diagnosis present

## 2014-11-24 DIAGNOSIS — J45909 Unspecified asthma, uncomplicated: Secondary | ICD-10-CM | POA: Diagnosis not present

## 2014-11-24 DIAGNOSIS — Z7982 Long term (current) use of aspirin: Secondary | ICD-10-CM | POA: Diagnosis not present

## 2014-11-24 DIAGNOSIS — Z79899 Other long term (current) drug therapy: Secondary | ICD-10-CM | POA: Diagnosis not present

## 2014-11-24 DIAGNOSIS — G8929 Other chronic pain: Secondary | ICD-10-CM

## 2014-11-24 DIAGNOSIS — Z7951 Long term (current) use of inhaled steroids: Secondary | ICD-10-CM | POA: Diagnosis not present

## 2014-11-24 DIAGNOSIS — M5417 Radiculopathy, lumbosacral region: Secondary | ICD-10-CM

## 2014-11-24 NOTE — ED Notes (Signed)
Pt returned from [facility].

## 2014-11-24 NOTE — ED Provider Notes (Signed)
CSN: [id number]     Arrival date & time 11/24/14  3557 History   First MD Initiated Contact with Patient 11/24/14 [phone]     Chief Complaint  Patient presents with  . Back Pain     (Consider location/radiation/quality/duration/timing/severity/associated sxs/prior Treatment) HPI Cody Raymond is a 44 y.o. Hispanic male who has a history of back pain and previous lumbar surgery who presents with chronic back pain and radiculopathy down the right leg but worse in the 2 days. No recent injury but states he heard a pop in his back during a CT exam of his chest 1 month ago. He is taking tramadol and gabapentin for pain. He denies any fever, chills, nausea, bowel or bladder incontinence. He ambulates with a cane at home.  Past Medical History  Diagnosis Date  . Diabetes mellitus   . Asthma   . Enlarged prostate   . Hyperlipidemia   . Heart disease   . Hypertension   . Blood transfusion   . GERD (gastroesophageal reflux disease)   . Refusal of blood transfusions as patient is Jehovah's Witness   . Neuromuscular disorder     DJD  . Coronary artery disease   . CAD (coronary artery disease), non obstructive on cath 2011 04/05/2012  . H/O syncope    Past Surgical History  Procedure Laterality Date  . Appendectomy    . Patent ductus arterious repair      at age 42  . Colonoscopy w/ polypectomy  03/17/11    diminutive polyp  . Cardiac catheterization  2007    Clean Cardiac Cath [facility]), with RCA 30% narrowing likely due to catheter induced spasm in 09/02/10.  . Back surgery    . Cardiac catheterization  09/02/2010    mod. nonobstructive disease in the RCA and CX, tortuous LAD  . Nm myocar perf wall motion  01/25/2008    mild anteroapical wall ischemia  . Loop recorder implant  04/06/2012    Reveal XT 4529  . Loop recorder implant N/A 04/06/2012    Procedure: LOOP RECORDER IMPLANT;  Surgeon: Sanda Klein, MD;  Location: [place] CATH LAB;  Service: Cardiovascular;  Laterality: N/A;  .  Loop recorder explant N/A 01/14/2014    Procedure: LOOP RECORDER EXPLANT;  Surgeon: Sanda Klein, MD;  Location: [place] CATH LAB;  Service: Cardiovascular;  Laterality: N/A;   Family History  Problem Relation Age of Onset  . Colon cancer Paternal Grandfather   . Prostate cancer Paternal Grandfather   . Aneurysm Father   . Heart attack Father 28  . Coronary artery disease Father   . Colon cancer Paternal Uncle     x 2  . Colon polyps Mother   . Heart disease Mother   . Coronary artery disease Mother   . Breast cancer Maternal Grandmother   . Stomach cancer Brother   . Liver disease      unsure who it was  . Allergies Daughter    History  Substance Use Topics  . Smoking status: Never Smoker   . Smokeless tobacco: Never Used  . Alcohol Use: Yes     Comment: rare    Review of Systems  Constitutional: Negative for fever and chills.  Gastrointestinal: Negative for nausea and abdominal pain.  Musculoskeletal: Positive for back pain.  All other systems reviewed and are negative.     Allergies  Benadryl and Red blood cells  Home Medications   Prior to Admission medications   Medication Sig Start Date End Date  Taking? Authorizing Provider  albuterol (PROVENTIL HFA;VENTOLIN HFA) 108 (90 BASE) MCG/ACT inhaler Inhale 2 puffs into the lungs every 6 (six) hours as needed for wheezing or shortness of breath. Use with spacer, for asthma 03/07/13  Yes [name], NP  albuterol (PROVENTIL) (2.5 MG/3ML) 0.083% nebulizer solution Take 3 mLs (2.5 mg total) by nebulization every 6 (six) hours as needed for wheezing or shortness of breath. For wheezing 11/03/14  Yes [name], NP  aspirin 325 MG tablet Take 325 mg by mouth daily.   Yes Historical Provider, MD  cetirizine (ZYRTEC) 10 MG tablet Take 10 mg by mouth daily.   Yes Historical Provider, MD  clonazePAM (KLONOPIN) 1 MG tablet Take 1 mg by mouth daily.  08/20/13  Yes Historical Provider, MD  diclofenac sodium (VOLTAREN) 1 %  GEL Apply 2 g topically 4 (four) times daily. 11/01/13  Yes Charlett Blake, MD  fenofibrate (TRICOR) 145 MG tablet Take 1 tablet (145 mg total) by mouth daily. 11/06/14  Yes [name], NP  fish oil-omega-3 fatty acids 1000 MG capsule Take 2 g by mouth 2 (two) times daily.    Yes Historical Provider, MD  fluocinonide gel (LIDEX) 0.05 % Apply sparingly to scalp twice daily as needed 10/13/14  Yes [name], NP  ketoconazole (NIZORAL) 2 % shampoo Apply 1 application topically daily. 11/03/14  Yes [name], NP  lansoprazole (PREVACID) 15 MG capsule Take 15 mg by mouth daily.   Yes Historical Provider, MD  lisinopril (PRINIVIL,ZESTRIL) 2.5 MG tablet Take 1 tablet (2.5 mg total) by mouth daily. 11/03/14  Yes [name], NP  metFORMIN (GLUCOPHAGE XR) 500 MG 24 hr tablet Take 1 tablet (500 mg total) by mouth daily with breakfast. 11/03/14  Yes [name], NP  methocarbamol (ROBAXIN) 500 MG tablet Take 1 tablet (500 mg total) by mouth at bedtime and may repeat dose one time if needed. 11/03/14  Yes [name], NP  montelukast (SINGULAIR) 10 MG tablet Take 1 tablet (10 mg total) by mouth at bedtime. 11/06/14  Yes [name], NP  naproxen sodium (ALEVE) 220 MG tablet Take 440 mg by mouth 2 (two) times daily. Take 440 mg in the morning and 220 mg in the evening.   Yes Historical Provider, MD  nitroGLYCERIN (NITROSTAT) 0.4 MG SL tablet Place 1 tablet (0.4 mg total) under the tongue every 5 (five) minutes as needed for chest pain. 10/13/14  Yes [name], NP  ramelteon (ROZEREM) 8 MG tablet Take 8 mg by mouth at bedtime. 07/23/12  Yes Burnice Logan, MD  rosuvastatin (CRESTOR) 40 MG tablet Take 1 tablet (40 mg total) by mouth daily. 10/13/14  Yes [name], NP  sertraline (ZOLOFT) 50 MG tablet Take 1 tablet (50 mg total) by mouth daily. 11/06/14  Yes [name], NP  tamsulosin (FLOMAX) 0.4 MG CAPS capsule Take 1 capsule (0.4 mg total) by  mouth daily. 11/06/14  Yes [name], NP  traMADol (ULTRAM) 50 MG tablet Take 1 tablet (50 mg total) by mouth every 6 (six) hours as needed. 11/17/14  Yes Charlett Blake, MD  traZODone (DESYREL) 100 MG tablet Take 1 tablet (100 mg total) by mouth at bedtime. 11/06/14  Yes [name], NP  zolpidem (AMBIEN) 10 MG tablet Take 1 tablet (10 mg total) by mouth at bedtime as needed for sleep. 10/13/14  Yes [name], NP  budesonide-formoterol (SYMBICORT) 160-4.5 MCG/ACT inhaler Inhale 2 puffs into the lungs 2 (two) times daily. 10/13/14   [name],  NP  gabapentin (NEURONTIN) 300 MG capsule Take 300 mg by mouth at 6 am, take 300 mg by mouth at 11:30 am, take 300 mg by mouth at 4:30 pm, take 600 mg by mouth at 10 pm. Patient taking differently: Take 300 mg by mouth at 6 am, take 300 mg by mouth at 11:30 am, take 300 mg by mouth at 4:30 pm, take 900 mg by mouth at 10 pm. 11/06/14   [name], NP   BP 120/96 mmHg  Pulse 92  Temp(Src) 98.4 F (36.9 C) (Oral)  Resp 18  SpO2 99% Physical Exam  Constitutional: He is oriented to person, place, and time. He appears well-developed and well-nourished.  Cardiovascular: Normal rate, regular rhythm and normal heart sounds.   Pulmonary/Chest: Effort normal and breath sounds normal.  Musculoskeletal:       Lumbar back: He exhibits decreased range of motion and pain. He exhibits no swelling and no deformity.  Lumbar vertical surgical scar. Right patellar tendon reflexes are diminished. Pain to palpation of lumbar paravertebral muscles. He is unable to flex, extend, or side bend his back secondary to pain.  Modified straight right leg raise with patient in seated position is pain at 30 degrees.  He is able to fully straighten left leg without pain.   Neurological: He is alert and oriented to person, place, and time.  Reflex Scores:      Patellar reflexes are 2+ on the right side and 4+ on the left side. No sensory deficit  in hips and pelvis.   Skin: Skin is warm and dry.  Nursing note and vitals reviewed.   ED Course  Procedures (including critical care time) Labs Review Labs Reviewed - No data to display  Imaging Review Dg Lumbar Spine Complete  11/24/2014   CLINICAL DATA:  Low back pain radiating to right leg, back injury 3 years ago  EXAM: LUMBAR SPINE - COMPLETE 4+ VIEW  COMPARISON:  Sagittal view of the spine 11/21/2013  FINDINGS: Five views of lumbar spine submitted. No acute fracture or subluxation. Mild anterior spurring upper endplate of L1 and L3 vertebral body. Mild disc space flattening at L5-S1 level.  IMPRESSION: No acute fracture or subluxation. Mild degenerative changes as described above.   Electronically Signed   By: Lahoma Crocker M.D.   On: 11/24/2014 11:28     EKG Interpretation None      MDM   Final diagnoses:  Bilateral low back pain without sciatica   He refused pain medication and said he took his tramadol 30 minutes ago. He has a previous history of lumbar back pain and surgery.  I have a low suspicion of epidural abscess or cauda equina syndrome.  He has not had any imaging done recently so I ordered an L-spine xray to r/o any boney abnormality.  12:00 Discussed the signs and symptoms to look for such as bowel and bladder incontinence and loss of sensation or feeling in the pelvis. He was seen by Dr. Cay Schillings, a neurosurgeon 3 years ago but does not want to go back to his office.  He has a physician, Dr. Jeris Penta that he would like to f/u with.  As I was leaving the room he called his PCP and was headed straight over there for a possible steroid injection to the lower back.   Ottie Glazier, PA-C 11/24/14 1918  Varney Biles, MD 11/25/14 1052

## 2014-11-24 NOTE — Progress Notes (Signed)
Subjective:    Patient ID: Cody Raymond, male    DOB: 06-Nov-1970, 44 y.o.   MRN: [id number]  HPI Sent over from the emergency department where he presented this morning for increasing low back pain as well as bilateral lower extremity pain  44 year old male with history of chronic low back pain as well as chronic radicular pain. He was doing well this morning until he sat on the toilet and then he got up experiencing increasing low back pain as well as pain and numbness in both legs. His right leg is worse than his left leg. When he moves his right leg it causes pain in his back as well as some pain shooting down his leg. When he lifts up his left leg he also gets some increased right-sided low back pain as well as pain down his right leg  No bowel or bladder dysfunction No recent fevers Pain Inventory Average Pain 8 Pain Right Now 10 My pain is constant, sharp, burning, dull, stabbing, tingling and aching  In the last 24 hours, has pain interfered with the following? General activity 9 Relation with others 8 Enjoyment of life 7 What TIME of day is your pain at its worst? all Sleep (in general) Poor  Pain is worse with: walking, bending, sitting and standing Pain improves with: medication Relief from Meds: 4  Mobility walk with assistance use a cane ability to climb steps?  no do you drive?  yes  Function disabled: date disabled . I need assistance with the following:  dressing and bathing  Neuro/Psych weakness numbness tremor tingling spasms dizziness confusion depression anxiety  Prior Studies Any changes since last visit?  no bone scan x-rays CT/MRI nerve study  Physicians involved in your care Any changes since last visit?  no   Family History  Problem Relation Age of Onset  . Colon cancer Paternal Grandfather   . Prostate cancer Paternal Grandfather   . Aneurysm Father   . Heart attack Father 56  . Coronary artery disease Father   . Colon  cancer Paternal Uncle     x 2  . Colon polyps Mother   . Heart disease Mother   . Coronary artery disease Mother   . Breast cancer Maternal Grandmother   . Stomach cancer Brother   . Liver disease      unsure who it was  . Allergies Daughter    History   Social History  . Marital Status: Married    Spouse Name: N/A  . Number of Children: N/A  . Years of Education: N/A   Social History Main Topics  . Smoking status: Never Smoker   . Smokeless tobacco: Never Used  . Alcohol Use: Yes     Comment: rare  . Drug Use: No  . Sexual Activity: Not on file   Other Topics Concern  . None   Social History Narrative   Holter monitor 08/2010: PVCs and sinus tachy.   Sleep Study (02/2008): mild sleep apnea, no indication for CPAP.   Past Surgical History  Procedure Laterality Date  . Appendectomy    . Patent ductus arterious repair      at age 57  . Colonoscopy w/ polypectomy  03/17/11    diminutive polyp  . Cardiac catheterization  2007    Clean Cardiac Cath [facility]), with RCA 30% narrowing likely due to catheter induced spasm in 09/02/10.  . Back surgery    . Cardiac catheterization  09/02/2010    mod.  nonobstructive disease in the RCA and CX, tortuous LAD  . Nm myocar perf wall motion  01/25/2008    mild anteroapical wall ischemia  . Loop recorder implant  04/06/2012    Reveal XT 4529  . Loop recorder implant N/A 04/06/2012    Procedure: LOOP RECORDER IMPLANT;  Surgeon: Sanda Klein, MD;  Location: [place] CATH LAB;  Service: Cardiovascular;  Laterality: N/A;  . Loop recorder explant N/A 01/14/2014    Procedure: LOOP RECORDER EXPLANT;  Surgeon: Sanda Klein, MD;  Location: [place] CATH LAB;  Service: Cardiovascular;  Laterality: N/A;   Past Medical History  Diagnosis Date  . Diabetes mellitus   . Asthma   . Enlarged prostate   . Hyperlipidemia   . Heart disease   . Hypertension   . Blood transfusion   . GERD (gastroesophageal reflux disease)   . Refusal of blood  transfusions as patient is Jehovah's Witness   . Neuromuscular disorder     DJD  . Coronary artery disease   . CAD (coronary artery disease), non obstructive on cath 2011 04/05/2012  . H/O syncope    There were no vitals taken for this visit.  Opioid Risk Score:   Fall Risk Score:    Review of Systems  Respiratory: Positive for cough, shortness of breath and wheezing.   Gastrointestinal: Positive for nausea, diarrhea and constipation.  Endocrine:       High blood sugar  Genitourinary:       Painful urination   Neurological: Positive for tremors, weakness and numbness.       Tingling Spasms   Psychiatric/Behavioral: Positive for confusion and dysphoric mood. The patient is nervous/anxious.        Objective:   Physical Exam  Constitutional: He is oriented to person, place, and time.  Neurological: He is alert and oriented to person, place, and time. No sensory deficit.  Reflex Scores:      Patellar reflexes are 2+ on the right side and 2+ on the left side.      Achilles reflexes are 2+ on the right side and 2+ on the left side. Pain inhibiting motion right lower extremity Has at least 4/5 right hip flexor and knee extensor and ankle dorsiflexor Positive straight leg raise on the right side as well as cross straight leg raise although this causes primarily back pain greater than right lower extremity pain Patient continues to use a cane to ambulate no evidence of toe drag          Assessment & Plan:  1. Acute exacerbation of chronic low back pain as well as radicular pain.His symptoms do not appear to be a new level. No new trauma, no red flags to indicate infection or tumor He has benefited in the past from right L4 transforaminal injection last injection performed in December 2014. I would recommend repeating epidural steroid injection today monitor for improvement.  If pain progressive or no improvement with epidural steroids or increasing weakness, would recommend  MRI

## 2014-11-24 NOTE — Patient Instructions (Addendum)
You received an epidural steroid injection under fluoroscopic guidance. This is the most accurate way to perform an epidural injection. This injection was performed to relieve thigh or leg or foot pain that may be related to a pinched nerve in the lumbar spine. The local anesthetic injected today may cause numbness in your leg for a couple hours. If it is severe we may need to observe you for 30-60 minutes after the injection. The cortisone medicine injected today may take several days to take full effect. This medicine can also cause facial flushing or feeling of being warm.  This injection may last for days weeks or months. It can be repeated if needed. If it is not effective, another spinal level may need to be injected. A repeat MRI may be needed  Other treatments include medication management as well as physical therapy. In some cases surgery may be an option.

## 2014-11-24 NOTE — ED Notes (Signed)
Pt a&o x4, ambulatory with use of cane at dc. Questions concerns denied. Pt stated that he will be going to visit primary m.d. Directly for pain injection

## 2014-11-24 NOTE — ED Notes (Signed)
Per pt, states he tweaked his back this am while on the toilet

## 2014-11-24 NOTE — Progress Notes (Signed)
Lumbar L4 Right selective nerve root block  under fluoroscopic guidance  Indication: Lumbosacral radiculitis is not relieved by medication management or other conservative care and interfering with self-care and mobility.   Informed consent was obtained after describing risk and benefits of the procedure with the patient, this includes bleeding, bruising, infection, paralysis and medication side effects.  The patient wishes to proceed and has given written consent.  Patient was placed in prone position.  The lumbar area was marked and prepped with Betadine.  It was entered with a 25-gauge 1-1/2 inch needle and one mL of 1% lidocaine was injected into the skin and subcutaneous tissue.  Then a 22-gauge 3.5 inch spinal needle was inserted into the Right L4-5 intervertebral foramen under AP, lateral, and oblique view. Omnipaque 180 x 42ml demonstrated epidural and nerve root spread.  Then a solution containing  2 mL of 1% MPF lidocaine with 89ml 10mg /ml dexamethasone was injected.  The patient tolerated procedure well.  Post procedure instructions were given.  Please see post procedure form.

## 2014-11-24 NOTE — ED Notes (Signed)
Pt observed walking in the room, interacting with visitors, sts pain is more tolerable if he is ambulating.

## 2014-11-24 NOTE — Progress Notes (Signed)
  PROCEDURE RECORD [facility]   Name: Cody Raymond DOB:12-31-1970 MRN: [id number]  Date:11/24/2014  Physician: Alysia Penna, MD    Nurse/CMA:Keenon Leitzel RN/[place] CMA  Allergies:  Allergies  Allergen Reactions  . Benadryl [Diphenhydramine Hcl]     "drives me nuts"  . Red Blood Cells     PT. REFUSES ANY BLOOD PRODUCTS - JEHOVAH'S WITNESS.    Consent Signed: Yes.    Is patient diabetic? Yes.    CBG today?no usually runs 110  Pregnant: No. LMP: No LMP for male patient. (age 10-55)  Anticoagulants: no Anti-inflammatory: no Antibiotics: no  Procedure: Lumbar level 4 Epidural Steroid Injection Position: Prone Start Time: 1:02 End Time: 1:05 Fluoro Time: 19 seconds  RN/CMA Wessling CMA Jarius Dieudonne RN    Time 12:37 1:08    BP 114/72 139/69    Pulse 87 93    Respirations 14 14    O2 Sat 98 97    S/S 6 6    Pain Level 10/10 10/10     D/C home with wife, patient A & O X 3, D/C instructions reviewed, and sits independently.

## 2014-11-24 NOTE — Discharge Instructions (Signed)
Back Exercises Back exercises help treat and prevent back injuries. The goal of back exercises is to increase the strength of your abdominal and back muscles and the flexibility of your back. These exercises should be started when you no longer have back pain. Back exercises include:  Pelvic Tilt. Lie on your back with your knees bent. Tilt your pelvis until the lower part of your back is against the floor. Hold this position 5 to 10 sec and repeat 5 to 10 times.  Knee to Chest. Pull first 1 knee up against your chest and hold for 20 to 30 seconds, repeat this with the other knee, and then both knees. This may be done with the other leg straight or bent, whichever feels better.  Sit-Ups or Curl-Ups. Bend your knees 90 degrees. Start with tilting your pelvis, and do a partial, slow sit-up, lifting your trunk only 30 to 45 degrees off the floor. Take at least 2 to 3 seconds for each sit-up. Do not do sit-ups with your knees out straight. If partial sit-ups are difficult, simply do the above but with only tightening your abdominal muscles and holding it as directed.  Hip-Lift. Lie on your back with your knees flexed 90 degrees. Push down with your feet and shoulders as you raise your hips a couple inches off the floor; hold for 10 seconds, repeat 5 to 10 times.  Back arches. Lie on your stomach, propping yourself up on bent elbows. Slowly press on your hands, causing an arch in your low back. Repeat 3 to 5 times. Any initial stiffness and discomfort should lessen with repetition over time.  Shoulder-Lifts. Lie face down with arms beside your body. Keep hips and torso pressed to floor as you slowly lift your head and shoulders off the floor. Do not overdo your exercises, especially in the beginning. Exercises may cause you some mild back discomfort which lasts for a few minutes; however, if the pain is more severe, or lasts for more than 15 minutes, do not continue exercises until you see your caregiver.  Improvement with exercise therapy for back problems is slow.  See your caregivers for assistance with developing a proper back exercise program. Document Released: 10/13/2004 Document Revised: 11/28/2011 Document Reviewed: 07/07/2011 [facility] Patient Information 2015 [place], [place]. This information is not intended to replace advice given to you by your health care provider. Make sure you discuss any questions you have with your health care provider.  Please return bowel or bladder incontinence, loss of sensation or feeling of pelvis or fever.

## 2014-11-24 NOTE — ED Notes (Signed)
Patient transported to X-ray 

## 2014-11-25 MED ORDER — DIAZEPAM 10 MG PO TABS: 10.0000 mg | ORAL_TABLET | Freq: Once | ORAL | Status: DC

## 2014-11-25 NOTE — Telephone Encounter (Addendum)
Called patient to arrange Valium prior to testing. Requested rx sent to [facility]. Patient had lab work 3/3. Called in rx to requested pharmacy.

## 2014-11-25 NOTE — Addendum Note (Signed)
Addended by: Stanton Kidney on: 11/25/2014 03:08 PM   Modules accepted: Orders

## 2014-11-27 ENCOUNTER — Ambulatory Visit (HOSPITAL_COMMUNITY): Admission: RE | Admit: 2014-11-27 | Payer: Medicare Other | Source: Ambulatory Visit

## 2014-12-23 NOTE — Telephone Encounter (Signed)
Called pt with his test results and he asked about an open MRI I researched and saw that he cancelled the MRI on 11/27/14 due  To being enclosed, i informed him that we did not have open MRI equipment, he is going to research and see if another hospital Has the option, i will inform Dr. Caryl Comes, pt also ask for a referral if He found a hospital that offers this, i will ask if that is a possibility Pt is agreeable to plan.

## 2014-12-25 ENCOUNTER — Encounter: Payer: Self-pay | Admitting: Physical Medicine & Rehabilitation

## 2014-12-25 ENCOUNTER — Encounter: Payer: Medicare Other | Attending: Physical Medicine & Rehabilitation

## 2014-12-25 ENCOUNTER — Ambulatory Visit (HOSPITAL_BASED_OUTPATIENT_CLINIC_OR_DEPARTMENT_OTHER): Payer: Medicare Other | Admitting: Physical Medicine & Rehabilitation

## 2014-12-25 VITALS — BP 94/63 | HR 95 | Resp 14

## 2014-12-25 DIAGNOSIS — M7061 Trochanteric bursitis, right hip: Secondary | ICD-10-CM

## 2014-12-25 DIAGNOSIS — M961 Postlaminectomy syndrome, not elsewhere classified: Secondary | ICD-10-CM | POA: Diagnosis not present

## 2014-12-25 DIAGNOSIS — M5416 Radiculopathy, lumbar region: Secondary | ICD-10-CM | POA: Diagnosis not present

## 2014-12-25 NOTE — Progress Notes (Signed)
Trochanteric bursa injection With  ultrasound guidance  Indication Trochanteric bursitis. Exam has tenderness over the greater trochanter of the hip. Pain has not responded to conservative care such as exercise therapy and oral medications. Pain interferes with sleep or with mobility Informed consent was obtained after describing risks and benefits of the procedure with the patient these include bleeding bruising and infection. Patient has signed written consent form. Patient placed in a lateral decubitus position with the affected hip superior. Point of maximal pain was palpated marked and prepped with Betadine and entered with a needle to bone contact. Needle slightly withdrawn then 6mg  of betamethasone with 4 cc 1% lidocaine were injected. Patient tolerated procedure well. Post procedure instructions given.

## 2014-12-25 NOTE — Patient Instructions (Signed)
Joint Injection  Care After  Refer to this sheet in the next few days. These instructions provide you with information on caring for yourself after you have had a joint injection. Your caregiver also may give you more specific instructions. Your treatment has been planned according to current medical practices, but problems sometimes occur. Call your caregiver if you have any problems or questions after your procedure.  After any type of joint injection, it is not uncommon to experience:  · Soreness, swelling, or bruising around the injection site.  · Mild numbness, tingling, or weakness around the injection site caused by the numbing medicine used before or with the injection.  It also is possible to experience the following effects associated with the specific agent after injection:  · Iodine-based contrast agents:  ¨ Allergic reaction (itching, hives, widespread redness, and swelling beyond the injection site).  · Corticosteroids (These effects are rare.):  ¨ Allergic reaction.  ¨ Increased blood sugar levels (If you have diabetes and you notice that your blood sugar levels have increased, notify your caregiver).  ¨ Increased blood pressure levels.  ¨ Mood swings.  · Hyaluronic acid in the use of viscosupplementation.  ¨ Temporary heat or redness.  ¨ Temporary rash and itching.  ¨ Increased fluid accumulation in the injected joint.  These effects all should resolve within a day after your procedure.   HOME CARE INSTRUCTIONS  · Limit yourself to light activity the day of your procedure. Avoid lifting heavy objects, bending, stooping, or twisting.  · Take prescription or over-the-counter pain medication as directed by your caregiver.  · You may apply ice to your injection site to reduce pain and swelling the day of your procedure. Ice may be applied 03-04 times:  ¨ Put ice in a plastic bag.  ¨ Place a towel between your skin and the bag.  ¨ Leave the ice on for no longer than 15-20 minutes each time.  SEEK  IMMEDIATE MEDICAL CARE IF:   · Pain and swelling get worse rather than better or extend beyond the injection site.  · Numbness does not go away.  · Blood or fluid continues to leak from the injection site.  · You have chest pain.  · You have swelling of your face or tongue.  · You have trouble breathing or you become dizzy.  · You develop a fever, chills, or severe tenderness at the injection site that last longer than 1 day.  MAKE SURE YOU:  · Understand these instructions.  · Watch your condition.  · Get help right away if you are not doing well or if you get worse.  Document Released: 05/19/2011 Document Revised: 11/28/2011 Document Reviewed: 05/19/2011  ExitCare® Patient Information ©2015 [employer]. This information is not intended to replace advice given to you by your health care provider. Make sure you discuss any questions you have with your health care provider.

## 2015-01-07 ENCOUNTER — Emergency Department (HOSPITAL_COMMUNITY)
Admission: EM | Admit: 2015-01-07 | Discharge: 2015-01-07 | Disposition: A | Discharge status: Home / Self Care | Payer: Medicare Other | Attending: Emergency Medicine | Admitting: Emergency Medicine

## 2015-01-07 ENCOUNTER — Emergency Department (HOSPITAL_COMMUNITY): Payer: Medicare Other

## 2015-01-07 ENCOUNTER — Encounter (HOSPITAL_COMMUNITY): Payer: Self-pay | Admitting: Emergency Medicine

## 2015-01-07 DIAGNOSIS — M549 Dorsalgia, unspecified: Secondary | ICD-10-CM

## 2015-01-07 DIAGNOSIS — Z9889 Other specified postprocedural states: Secondary | ICD-10-CM | POA: Insufficient documentation

## 2015-01-07 DIAGNOSIS — J45909 Unspecified asthma, uncomplicated: Secondary | ICD-10-CM | POA: Insufficient documentation

## 2015-01-07 DIAGNOSIS — E785 Hyperlipidemia, unspecified: Secondary | ICD-10-CM | POA: Diagnosis not present

## 2015-01-07 DIAGNOSIS — N400 Enlarged prostate without lower urinary tract symptoms: Secondary | ICD-10-CM | POA: Diagnosis not present

## 2015-01-07 DIAGNOSIS — M1990 Unspecified osteoarthritis, unspecified site: Secondary | ICD-10-CM | POA: Diagnosis not present

## 2015-01-07 DIAGNOSIS — M533 Sacrococcygeal disorders, not elsewhere classified: Secondary | ICD-10-CM | POA: Insufficient documentation

## 2015-01-07 DIAGNOSIS — M25551 Pain in right hip: Secondary | ICD-10-CM | POA: Insufficient documentation

## 2015-01-07 DIAGNOSIS — K219 Gastro-esophageal reflux disease without esophagitis: Secondary | ICD-10-CM | POA: Diagnosis not present

## 2015-01-07 DIAGNOSIS — G8929 Other chronic pain: Secondary | ICD-10-CM | POA: Diagnosis not present

## 2015-01-07 DIAGNOSIS — I10 Essential (primary) hypertension: Secondary | ICD-10-CM | POA: Diagnosis not present

## 2015-01-07 DIAGNOSIS — Z79899 Other long term (current) drug therapy: Secondary | ICD-10-CM | POA: Diagnosis not present

## 2015-01-07 DIAGNOSIS — E119 Type 2 diabetes mellitus without complications: Secondary | ICD-10-CM | POA: Diagnosis not present

## 2015-01-07 DIAGNOSIS — Z791 Long term (current) use of non-steroidal anti-inflammatories (NSAID): Secondary | ICD-10-CM | POA: Insufficient documentation

## 2015-01-07 DIAGNOSIS — I2510 Atherosclerotic heart disease of native coronary artery without angina pectoris: Secondary | ICD-10-CM | POA: Insufficient documentation

## 2015-01-07 DIAGNOSIS — Z7982 Long term (current) use of aspirin: Secondary | ICD-10-CM | POA: Insufficient documentation

## 2015-01-07 DIAGNOSIS — M25559 Pain in unspecified hip: Secondary | ICD-10-CM

## 2015-01-07 NOTE — ED Provider Notes (Signed)
CSN: [id number]     Arrival date & time 01/07/15  8315 History  This chart was scribed for Quincy Carnes, PA-C, working with Dorie Rank, MD by Steva Colder, ED Scribe. The patient was seen in room WTR7/WTR7 at 8:26 PM.    Chief Complaint  Patient presents with  . Back Pain      The history is provided by the patient. No language interpreter was used.    HPI Comments: Cody Raymond is a 44 y.o. male with a medical hx of chronic back pain, DM, HTN, CAD, who presents to the Emergency Department complaining of back pain onset 3 weeks. Pt goes to a pain clinic for his chronic back pain. Pt reports that his back pain radiates down his right groin and right leg.  No testicle pain or urinary symptoms. Pt reports that from the right thigh down the leg will fall asleep and he will have to move it to wake it up. Pt denies any new injuries.  States symptoms have been worse for the past month after having to lying flat for over an hour while having CT scan of his chest performed for other reasons.  He states that he has tried massages that will loosen the muscles and tramadol with mild relief for his symptoms. He denies any other symptoms. Pt has had a back surgery. Pt is on tramadol and Neurontin. Pt has not had any steroids Rx. When the pt received his steroid injections twice this month it interfered with his DM.  No new weakness of legs.  No loss of bowel or bladder control.  No fever, chills, sweats.  No hx of cancer or IVDU.  VSS.  Past Medical History  Diagnosis Date  . Diabetes mellitus   . Asthma   . Enlarged prostate   . Hyperlipidemia   . Heart disease   . Hypertension   . Blood transfusion   . GERD (gastroesophageal reflux disease)   . Refusal of blood transfusions as patient is Jehovah's Witness   . Neuromuscular disorder     DJD  . Coronary artery disease   . CAD (coronary artery disease), non obstructive on cath 2011 04/05/2012  . H/O syncope    Past Surgical History  Procedure  Laterality Date  . Appendectomy    . Patent ductus arterious repair      at age 54  . Colonoscopy w/ polypectomy  03/17/11    diminutive polyp  . Cardiac catheterization  2007    Clean Cardiac Cath [facility] Cards), with RCA 30% narrowing likely due to catheter induced spasm in 09/02/10.  . Back surgery    . Cardiac catheterization  09/02/2010    mod. nonobstructive disease in the RCA and CX, tortuous LAD  . Nm myocar perf wall motion  01/25/2008    mild anteroapical wall ischemia  . Loop recorder implant  04/06/2012    Reveal XT 4529  . Loop recorder implant N/A 04/06/2012    Procedure: LOOP RECORDER IMPLANT;  Surgeon: Sanda Klein, MD;  Location: [facility] CATH LAB;  Service: Cardiovascular;  Laterality: N/A;  . Loop recorder explant N/A 01/14/2014    Procedure: LOOP RECORDER EXPLANT;  Surgeon: Sanda Klein, MD;  Location: [place] CATH LAB;  Service: Cardiovascular;  Laterality: N/A;   Family History  Problem Relation Age of Onset  . Colon cancer Paternal Grandfather   . Prostate cancer Paternal Grandfather   . Aneurysm Father   . Heart attack Father 32  . Coronary artery disease Father   .  Colon cancer Paternal Uncle     x 2  . Colon polyps Mother   . Heart disease Mother   . Coronary artery disease Mother   . Breast cancer Maternal Grandmother   . Stomach cancer Brother   . Liver disease      unsure who it was  . Allergies Daughter    History  Substance Use Topics  . Smoking status: Never Smoker   . Smokeless tobacco: Never Used  . Alcohol Use: Yes     Comment: rare    Review of Systems  Genitourinary:       No bladder incontinence  Musculoskeletal: Positive for back pain and arthralgias (bilateral hip). Negative for myalgias, joint swelling and gait problem.  All other systems reviewed and are negative.     Allergies  Benadryl and Red blood cells  Home Medications   Prior to Admission medications   Medication Sig Start Date End Date Taking? Authorizing Provider   albuterol (PROVENTIL HFA;VENTOLIN HFA) 108 (90 BASE) MCG/ACT inhaler Inhale 2 puffs into the lungs every 6 (six) hours as needed for wheezing or shortness of breath. Use with spacer, for asthma 03/07/13   [name], NP  albuterol (PROVENTIL) (2.5 MG/3ML) 0.083% nebulizer solution Take 3 mLs (2.5 mg total) by nebulization every 6 (six) hours as needed for wheezing or shortness of breath. For wheezing 11/03/14   [name], NP  aspirin 325 MG tablet Take 325 mg by mouth daily.    Historical Provider, MD  budesonide-formoterol (SYMBICORT) 160-4.5 MCG/ACT inhaler Inhale 2 puffs into the lungs 2 (two) times daily. 10/13/14   [name], NP  cetirizine (ZYRTEC) 10 MG tablet Take 10 mg by mouth daily.    Historical Provider, MD  clonazePAM (KLONOPIN) 1 MG tablet Take 1 mg by mouth daily.  08/20/13   Historical Provider, MD  diazepam (VALIUM) 10 MG tablet Take 1 tablet (10 mg total) by mouth once. 11/25/14   Deboraha Sprang, MD  diclofenac sodium (VOLTAREN) 1 % GEL Apply 2 g topically 4 (four) times daily. 11/01/13   Charlett Blake, MD  fenofibrate (TRICOR) 145 MG tablet Take 1 tablet (145 mg total) by mouth daily. 11/06/14   [name], NP  fish oil-omega-3 fatty acids 1000 MG capsule Take 2 g by mouth 2 (two) times daily.     Historical Provider, MD  fluocinonide gel (LIDEX) 0.05 % Apply sparingly to scalp twice daily as needed 10/13/14   [name], NP  gabapentin (NEURONTIN) 300 MG capsule Take 300 mg by mouth at 6 am, take 300 mg by mouth at 11:30 am, take 300 mg by mouth at 4:30 pm, take 600 mg by mouth at 10 pm. Patient taking differently: Take 300 mg by mouth at 6 am, take 300 mg by mouth at 11:30 am, take 300 mg by mouth at 4:30 pm, take 900 mg by mouth at 10 pm. 11/06/14   [name], NP  ketoconazole (NIZORAL) 2 % shampoo Apply 1 application topically daily. 11/03/14   [name], NP  lansoprazole (PREVACID) 15 MG capsule Take 15 mg by mouth daily.     Historical Provider, MD  lisinopril (PRINIVIL,ZESTRIL) 2.5 MG tablet Take 1 tablet (2.5 mg total) by mouth daily. 11/03/14   [name], NP  metFORMIN (GLUCOPHAGE XR) 500 MG 24 hr tablet Take 1 tablet (500 mg total) by mouth daily with breakfast. 11/03/14   [name], NP  methocarbamol (ROBAXIN) 500 MG tablet Take 1 tablet (500 mg total)  by mouth at bedtime and may repeat dose one time if needed. 11/03/14   [name], NP  montelukast (SINGULAIR) 10 MG tablet Take 1 tablet (10 mg total) by mouth at bedtime. 11/06/14   [name], NP  naproxen sodium (ALEVE) 220 MG tablet Take 440 mg by mouth 2 (two) times daily. Take 440 mg in the morning and 220 mg in the evening.    Historical Provider, MD  nitroGLYCERIN (NITROSTAT) 0.4 MG SL tablet Place 1 tablet (0.4 mg total) under the tongue every 5 (five) minutes as needed for chest pain. 10/13/14   [name], NP  ramelteon (ROZEREM) 8 MG tablet Take 8 mg by mouth at bedtime. 07/23/12   Burnice Logan, MD  rosuvastatin (CRESTOR) 40 MG tablet Take 1 tablet (40 mg total) by mouth daily. 10/13/14   [name], NP  sertraline (ZOLOFT) 50 MG tablet Take 1 tablet (50 mg total) by mouth daily. 11/06/14   [name], NP  tamsulosin (FLOMAX) 0.4 MG CAPS capsule Take 1 capsule (0.4 mg total) by mouth daily. 11/06/14   [name], NP  traMADol (ULTRAM) 50 MG tablet Take 1 tablet (50 mg total) by mouth every 6 (six) hours as needed. 11/17/14   Charlett Blake, MD  traZODone (DESYREL) 100 MG tablet Take 1 tablet (100 mg total) by mouth at bedtime. 11/06/14   [name], NP  zolpidem (AMBIEN) 10 MG tablet Take 1 tablet (10 mg total) by mouth at bedtime as needed for sleep. 10/13/14   [name], NP   BP 113/99 mmHg  Pulse 106  Temp(Src) 98.1 F (36.7 C) (Oral)  Resp 16  SpO2 100%  Physical Exam  Constitutional: He is oriented to person, place, and time. He appears well-developed and  well-nourished. No distress.  HENT:  Head: Normocephalic and atraumatic.  Mouth/Throat: Oropharynx is clear and moist.  Eyes: Conjunctivae and EOM are normal. Pupils are equal, round, and reactive to light.  Neck: Normal range of motion. Neck supple. No tracheal deviation present.  Cardiovascular: Normal rate, regular rhythm and normal heart sounds.   Pulmonary/Chest: Effort normal and breath sounds normal. No respiratory distress.  Musculoskeletal: Normal range of motion.  Tenderness of right SI joint without mid-line tenderness or step-off; when pressure applied to right SI joint, pain radiates into groin; + SLR on right; normal strength and sensation of BLE; normal gait  Neurological: He is alert and oriented to person, place, and time.  Skin: Skin is warm and dry.  Psychiatric: He has a normal mood and affect. His behavior is normal.  Nursing note and vitals reviewed.   ED Course  Procedures (including critical care time) DIAGNOSTIC STUDIES: Oxygen Saturation is 100% on RA, nl by my interpretation.    COORDINATION OF CARE: 8:38 PM-Discussed treatment plan which includes referral to [facility] and right hip X-ray with pt at bedside and pt agreed to plan.   Labs Review Labs Reviewed - No data to display  Imaging Review Dg Hip Unilat With Pelvis 2-3 Views Right  01/07/2015   CLINICAL DATA:  Right hip pain for 3 days with no known injury.  EXAM: RIGHT HIP 2 VIEWS  COMPARISON:  None.  FINDINGS: No evidence of fracture, dislocation, or osteonecrosis. No degenerative joint narrowing. Negative right hemipelvis.  IMPRESSION: Negative.   Electronically Signed   By: Monte Fantasia M.D.   On: 01/07/2015 21:53     EKG Interpretation None      MDM   Final diagnoses:  Hip pain  Back pain, unspecified location   44 year old male with chronic back pain. He is in a pain management clinic with contract. He states increased pain extending into his right groin and leg. He denies  any testicle pain or urinary symptoms.  On exam, he has tenderness of his right SI joint. When pressure applied to this area pain radiates into groin.  He has positive straight leg raise on right consistent with sciatic nerve pain. He has concern for developing damage to his right hip, x-ray was obtained which is negative for acute findings. Patient likely needs MRI for further evaluation, however he has no focal neurologic deficits and I do not feel it needs to be done emergently in the ED today. Patient given referral to neurosurgery for further management, he will call in the morning to schedule appointment. I have offered him pain medication while in the ED, he declined stating he will continue taking his home tramadol.  Discussed plan with patient, he/she acknowledged understanding and agreed with plan of care.  Return precautions given for new or worsening symptoms.  I personally performed the services described in this documentation, which was scribed in my presence. The recorded information has been reviewed and is accurate.  Larene Pickett, PA-C 01/07/15 2303  Dorie Rank, MD 01/08/15 [phone]

## 2015-01-07 NOTE — ED Notes (Signed)
Per pt, goes to pain clinic for chronic back pain-states increased pain that radiates down right groin and leg

## 2015-01-07 NOTE — Discharge Instructions (Signed)
Continue taking her tramadol as needed for pain. Follow-up with [facility]. Call tomorrow to see if prior authorization required, if so contact her primary care physician. Return here as needed for new concerns.

## 2015-01-16 ENCOUNTER — Encounter: Payer: Self-pay | Admitting: Family

## 2015-01-16 ENCOUNTER — Ambulatory Visit (INDEPENDENT_AMBULATORY_CARE_PROVIDER_SITE_OTHER): Payer: Medicare Other | Admitting: Family

## 2015-01-16 VITALS — BP 118/78 | HR 88 | Temp 98.2°F | Ht 69.0 in | Wt 218.2 lb

## 2015-01-16 DIAGNOSIS — E785 Hyperlipidemia, unspecified: Secondary | ICD-10-CM | POA: Diagnosis not present

## 2015-01-16 DIAGNOSIS — M961 Postlaminectomy syndrome, not elsewhere classified: Secondary | ICD-10-CM

## 2015-01-16 DIAGNOSIS — R350 Frequency of micturition: Secondary | ICD-10-CM

## 2015-01-16 DIAGNOSIS — E119 Type 2 diabetes mellitus without complications: Secondary | ICD-10-CM

## 2015-01-16 DIAGNOSIS — E1140 Type 2 diabetes mellitus with diabetic neuropathy, unspecified: Secondary | ICD-10-CM | POA: Diagnosis not present

## 2015-01-16 DIAGNOSIS — F419 Anxiety disorder, unspecified: Secondary | ICD-10-CM

## 2015-01-16 DIAGNOSIS — E781 Pure hyperglyceridemia: Secondary | ICD-10-CM

## 2015-01-16 LAB — HEMOGLOBIN A1C: Hgb A1c MFr Bld: 6.4 % (ref 4.6–6.5)

## 2015-01-16 LAB — LDL CHOLESTEROL, DIRECT: Direct LDL: 46 mg/dL

## 2015-01-16 LAB — LIPID PANEL
CHOL/HDL RATIO: 6
Cholesterol: 222 mg/dL — ABNORMAL HIGH (ref 0–200)
HDL: 35.2 mg/dL — ABNORMAL LOW (ref >39.00)
Triglycerides: 662 mg/dL — ABNORMAL HIGH (ref 0.0–149.0)

## 2015-01-16 MED ORDER — SERTRALINE HCL 50 MG PO TABS: 50.0000 mg | ORAL_TABLET | Freq: Every day | ORAL | Status: DC

## 2015-01-16 MED ORDER — CLONAZEPAM 1 MG PO TABS: 1.0000 mg | ORAL_TABLET | Freq: Every day | ORAL | Status: DC

## 2015-01-16 NOTE — Assessment & Plan Note (Signed)
Obtain follow up lipid panel, continue current meds.

## 2015-01-16 NOTE — Progress Notes (Signed)
Subjective:    Patient ID: Cody Raymond, male    DOB: Apr 23, 1971, 44 y.o.   MRN: [id number]  HPI  Cody Raymond is a 44 yr old male who presents today for follow up.  Patient presents today for follow up of multiple medical problems.  Diabetes Type 2  Pt is currently maintained on the following medications for diabetes:metformin  Lab Results  Component Value Date   HGBA1C 6.5 10/13/2014   HGBA1C 6.0 07/08/2014   HGBA1C 6.2* 11/20/2013    Lab Results  Component Value Date   MICROALBUR 1.15 11/20/2013   LDLCALC NOT CALC 11/20/2013   CREATININE 0.99 11/20/2014    Last diabetic eye exam was  Denies polyuria/polydipsia. Denies hypoglycemia Home glucose readings range:  Not checking, out of strips/ needs new machine.   Hyperlipidemia  Patient is currently maintained on the following medication for hyperlipidemia: on crestor and fenofibrate Last lipid panel as follows:  Lab Results  Component Value Date   CHOL 230* 08/20/2014   HDL 27.50* 08/20/2014   LDLCALC NOT CALC 11/20/2013   LDLDIRECT 59.4 08/20/2014   TRIG * 08/20/2014    679.0 Triglyceride is over 400; calculations on Lipids are invalid.   CHOLHDL 8 08/20/2014   Patient denies myalgia.  Patient reports fair compliance with low fat/low cholesterol diet.    Low back pain- was seen in ED 4/20 low back pain radiates into the right groin. Seeing chiropractic.  Has had 2 adjustments.  He is scheduled to see Neurosurgery on 02/19/15- Dr. Christella Noa.   Anxiety- takes once a day, thinks he took dose yesterday. Continues zoloft.     Review of Systems See HPI  Past Medical History  Diagnosis Date  . Diabetes mellitus   . Asthma   . Enlarged prostate   . Hyperlipidemia   . Heart disease   . Hypertension   . Blood transfusion   . GERD (gastroesophageal reflux disease)   . Refusal of blood transfusions as patient is Jehovah's Witness   . Neuromuscular disorder     DJD  . Coronary artery disease   . CAD  (coronary artery disease), non obstructive on cath 2011 04/05/2012  . H/O syncope     History   Social History  . Marital Status: Married    Spouse Name: N/A  . Number of Children: N/A  . Years of Education: N/A   Occupational History  . Not on file.   Social History Main Topics  . Smoking status: Never Smoker   . Smokeless tobacco: Never Used  . Alcohol Use: Yes     Comment: rare  . Drug Use: No  . Sexual Activity: Not on file   Other Topics Concern  . Not on file   Social History Narrative   Holter monitor 08/2010: PVCs and sinus tachy.   Sleep Study (02/2008): mild sleep apnea, no indication for CPAP.    Past Surgical History  Procedure Laterality Date  . Appendectomy    . Patent ductus arterious repair      at age 89  . Colonoscopy w/ polypectomy  03/17/11    diminutive polyp  . Cardiac catheterization  2007    Clean Cardiac Cath [facility] Cards), with RCA 30% narrowing likely due to catheter induced spasm in 09/02/10.  . Back surgery    . Cardiac catheterization  09/02/2010    mod. nonobstructive disease in the RCA and CX, tortuous LAD  . Nm myocar perf wall motion  01/25/2008    mild  anteroapical wall ischemia  . Loop recorder implant  04/06/2012    Reveal XT 4529  . Loop recorder implant N/A 04/06/2012    Procedure: LOOP RECORDER IMPLANT;  Surgeon: Sanda Klein, MD;  Location: [facility] CATH LAB;  Service: Cardiovascular;  Laterality: N/A;  . Loop recorder explant N/A 01/14/2014    Procedure: LOOP RECORDER EXPLANT;  Surgeon: Sanda Klein, MD;  Location: [facility] CATH LAB;  Service: Cardiovascular;  Laterality: N/A;    Family History  Problem Relation Age of Onset  . Colon cancer Paternal Grandfather   . Prostate cancer Paternal Grandfather   . Aneurysm Father   . Heart attack Father 24  . Coronary artery disease Father   . Colon cancer Paternal Uncle     x 2  . Colon polyps Mother   . Heart disease Mother   . Coronary artery disease Mother   . Breast cancer  Maternal Grandmother   . Stomach cancer Brother   . Liver disease      unsure who it was  . Allergies Daughter     Allergies  Allergen Reactions  . Benadryl [Diphenhydramine Hcl]     "drives me nuts"  . Red Blood Cells     PT. REFUSES ANY BLOOD PRODUCTS - JEHOVAH'S WITNESS.    Current Outpatient Prescriptions on File Prior to Visit  Medication Sig Dispense Refill  . albuterol (PROVENTIL) (2.5 MG/3ML) 0.083% nebulizer solution Take 3 mLs (2.5 mg total) by nebulization every 6 (six) hours as needed for wheezing or shortness of breath. For wheezing 75 mL 1  . aspirin 325 MG tablet Take 325 mg by mouth daily.    . budesonide-formoterol (SYMBICORT) 160-4.5 MCG/ACT inhaler Inhale 2 puffs into the lungs 2 (two) times daily. 3 Inhaler 1  . cetirizine (ZYRTEC) 10 MG tablet Take 10 mg by mouth daily.    . diazepam (VALIUM) 10 MG tablet Take 1 tablet (10 mg total) by mouth once. 1 tablet 0  . diclofenac sodium (VOLTAREN) 1 % GEL Apply 2 g topically 4 (four) times daily.    . fenofibrate (TRICOR) 145 MG tablet Take 1 tablet (145 mg total) by mouth daily. 90 tablet 0  . fish oil-omega-3 fatty acids 1000 MG capsule Take 2 g by mouth daily.     . fluocinonide gel (LIDEX) 0.05 % Apply sparingly to scalp twice daily as needed 60 g 2  . gabapentin (NEURONTIN) 300 MG capsule Take 300 mg by mouth at 6 am, take 300 mg by mouth at 11:30 am, take 300 mg by mouth at 4:30 pm, take 600 mg by mouth at 10 pm. (Patient taking differently: Take 300 mg by mouth at 6 am, take 300 mg by mouth at 11:30 am, take 300 mg by mouth at 4:30 pm, take 900 mg by mouth at 10 pm.) 450 capsule 0  . ketoconazole (NIZORAL) 2 % shampoo Apply 1 application topically daily. 360 mL 1  . lansoprazole (PREVACID) 15 MG capsule Take 15 mg by mouth daily.    Marland Kitchen lisinopril (PRINIVIL,ZESTRIL) 2.5 MG tablet Take 1 tablet (2.5 mg total) by mouth daily. 90 tablet 1  . metFORMIN (GLUCOPHAGE XR) 500 MG 24 hr tablet Take 1 tablet (500 mg total) by  mouth daily with breakfast. 90 tablet 1  . methocarbamol (ROBAXIN) 500 MG tablet Take 1 tablet (500 mg total) by mouth at bedtime and may repeat dose one time if needed. 90 tablet 1  . montelukast (SINGULAIR) 10 MG tablet Take 1 tablet (10 mg  total) by mouth at bedtime. 90 tablet 0  . Multiple Vitamin (MULTIVITAMIN WITH MINERALS) TABS tablet Take 1 tablet by mouth daily.    . naproxen sodium (ALEVE) 220 MG tablet Take 440 mg by mouth 2 (two) times daily. Take 440 mg in the morning and 220 mg in the evening.    . nitroGLYCERIN (NITROSTAT) 0.4 MG SL tablet Place 1 tablet (0.4 mg total) under the tongue every 5 (five) minutes as needed for chest pain. 30 tablet 1  . ramelteon (ROZEREM) 8 MG tablet Take 8 mg by mouth at bedtime.    . rosuvastatin (CRESTOR) 40 MG tablet Take 1 tablet (40 mg total) by mouth daily. 90 tablet 3  . tamsulosin (FLOMAX) 0.4 MG CAPS capsule Take 1 capsule (0.4 mg total) by mouth daily. 90 capsule 0  . traMADol (ULTRAM) 50 MG tablet Take 1 tablet (50 mg total) by mouth every 6 (six) hours as needed. 120 tablet 5  . traZODone (DESYREL) 100 MG tablet Take 1 tablet (100 mg total) by mouth at bedtime. 90 tablet 0  . zolpidem (AMBIEN) 10 MG tablet Take 1 tablet (10 mg total) by mouth at bedtime as needed for sleep. 30 tablet 0  . albuterol (PROVENTIL HFA;VENTOLIN HFA) 108 (90 BASE) MCG/ACT inhaler Inhale 2 puffs into the lungs every 6 (six) hours as needed for wheezing or shortness of breath. Use with spacer, for asthma    . [DISCONTINUED] niacin (NIASPAN) 1000 MG CR tablet Take 1 tablet (1,000 mg total) by mouth at bedtime. 30 tablet 6   No current facility-administered medications on file prior to visit.    BP 118/78 mmHg  Pulse 88  Temp(Src) 98.2 F (36.8 C) (Oral)  Ht 5\' 9"  (1.753 m)  Wt 218 lb 3.2 oz (98.975 kg)  BMI 32.21 kg/m2  SpO2 99%        Objective:   Physical Exam  Constitutional: He is oriented to person, place, and time. He appears well-developed and  well-nourished. No distress.  HENT:  Head: Normocephalic and atraumatic.  Cardiovascular: Normal rate and regular rhythm.   No murmur heard. Pulmonary/Chest: Effort normal and breath sounds normal. No respiratory distress. He has no wheezes. He has no rales.  Musculoskeletal: He exhibits no edema.  Neurological: He is alert and oriented to person, place, and time.  Skin: Skin is warm and dry.  Psychiatric: He has a normal mood and affect. His behavior is normal. Thought content normal.          Assessment & Plan:

## 2015-01-16 NOTE — Assessment & Plan Note (Signed)
Has apt with neurosurgery- will defer management to neurosurgery.

## 2015-01-16 NOTE — Assessment & Plan Note (Signed)
Clinically stable on metformin. A new meter was provided to pt today. Obtain a1c.

## 2015-01-16 NOTE — Assessment & Plan Note (Signed)
Stable on zoloft and prn clonazepam. Controlled substance contract is signed today and refills are provided.

## 2015-01-16 NOTE — Patient Instructions (Signed)
Please complete lab work prior to leaving. Follow up in 3 months.  

## 2015-01-18 ENCOUNTER — Telehealth: Payer: Self-pay | Admitting: Family

## 2015-01-18 DIAGNOSIS — E781 Pure hyperglyceridemia: Secondary | ICD-10-CM

## 2015-01-18 DIAGNOSIS — N400 Enlarged prostate without lower urinary tract symptoms: Secondary | ICD-10-CM

## 2015-01-18 LAB — URINE CULTURE: Colony Count: >=100000

## 2015-01-18 MED ORDER — CIPROFLOXACIN HCL 500 MG PO TABS: 500.0000 mg | ORAL_TABLET | Freq: Two times a day (BID) | ORAL | Status: DC

## 2015-01-18 NOTE — Telephone Encounter (Signed)
Please contact pt and let him know urine culture + UTI, advise he begin cipro, call if symptoms worsen or do not improve. Also, triglycerides are still very high.  Please confirm compliance with crestor and fenofibrate.  If compliante, then I would recommend that he be referred to lipid clinic.  Pended below.  Sugar is at goal.

## 2015-01-18 NOTE — Telephone Encounter (Signed)
Could you please send one touch verio test strips to his mail order, check sugar bid? Thanks.

## 2015-01-19 ENCOUNTER — Ambulatory Visit (HOSPITAL_BASED_OUTPATIENT_CLINIC_OR_DEPARTMENT_OTHER): Payer: Medicare Other | Admitting: Physical Medicine & Rehabilitation

## 2015-01-19 ENCOUNTER — Encounter: Payer: Self-pay | Admitting: Physical Medicine & Rehabilitation

## 2015-01-19 ENCOUNTER — Encounter: Payer: Medicare Other | Attending: Physical Medicine & Rehabilitation

## 2015-01-19 VITALS — BP 109/73 | HR 98 | Resp 14

## 2015-01-19 DIAGNOSIS — M5416 Radiculopathy, lumbar region: Secondary | ICD-10-CM

## 2015-01-19 DIAGNOSIS — M961 Postlaminectomy syndrome, not elsewhere classified: Secondary | ICD-10-CM | POA: Diagnosis not present

## 2015-01-19 DIAGNOSIS — R1031 Right lower quadrant pain: Secondary | ICD-10-CM | POA: Insufficient documentation

## 2015-01-19 DIAGNOSIS — R1030 Lower abdominal pain, unspecified: Secondary | ICD-10-CM | POA: Diagnosis not present

## 2015-01-19 NOTE — Progress Notes (Signed)
Subjective:    Patient ID: Cody Raymond, male    DOB: 1971-05-10, 44 y.o.   MRN: [id number] Chief complaint is right-sided groin pain HPI 44 year old male who was involved in a work-related injury 2012 eventually underwent on 06/03/2011 Decompressive laminectomy as well as microdiscectomy and foraminotomies right L4-L5. He has had chronic back pain and right lower extremity pain since that time. He has had other chronic pain issues including right Achilles tendinitis as well as right trochanteric bursitis. In the recent weeks she's had increasing right-sided hip pain and low back pain and groin pain. He was seen in the emergency department   X-rays in the emergency department 11/24/2014 demonstrated L5-S1 facet arthropathy not noted on the report. Otherwise disc spaces are relatively well maintained. Right hip x-ray done in the emergency department 01/07/2015 showed no fracture or subluxations or significant degenerative changes.  He has some numbness in the right side of the groin area, in addition he feels a pulling sensation in his scrotal area. Incidentally noted to have urinary tract infection in the ED was prescribed antibiotics Cipro 500 twice a day for 3 days. Pain Inventory Average Pain 9 Pain Right Now 8 My pain is sharp, burning, dull, stabbing, tingling and aching  In the last 24 hours, has pain interfered with the following? General activity 3 Relation with others 3 Enjoyment of life 3 What TIME of day is your pain at its worst? all Sleep (in general) Poor  Pain is worse with: walking, bending, sitting and standing Pain improves with: rest Relief from Meds: 3  Mobility walk with assistance use a cane ability to climb steps?  no do you drive?  yes  Function disabled: date disabled . I need assistance with the following:  dressing and bathing  Neuro/Psych weakness numbness tremor tingling trouble walking spasms dizziness depression anxiety loss of  taste or smell  Prior Studies Any changes since last visit?  no  Physicians involved in your care Any changes since last visit?  no   Family History  Problem Relation Age of Onset  . Colon cancer Paternal Grandfather   . Prostate cancer Paternal Grandfather   . Aneurysm Father   . Heart attack Father 43  . Coronary artery disease Father   . Colon cancer Paternal Uncle     x 2  . Colon polyps Mother   . Heart disease Mother   . Coronary artery disease Mother   . Breast cancer Maternal Grandmother   . Stomach cancer Brother   . Liver disease      unsure who it was  . Allergies Daughter    History   Social History  . Marital Status: Married    Spouse Name: N/A  . Number of Children: N/A  . Years of Education: N/A   Social History Main Topics  . Smoking status: Never Smoker   . Smokeless tobacco: Never Used  . Alcohol Use: Yes     Comment: rare  . Drug Use: No  . Sexual Activity: Not on file   Other Topics Concern  . None   Social History Narrative   Holter monitor 08/2010: PVCs and sinus tachy.   Sleep Study (02/2008): mild sleep apnea, no indication for CPAP.   Past Surgical History  Procedure Laterality Date  . Appendectomy    . Patent ductus arterious repair      at age 37  . Colonoscopy w/ polypectomy  03/17/11    diminutive polyp  . Cardiac catheterization  2007    Clean Cardiac Cath [facility] Cards), with RCA 30% narrowing likely due to catheter induced spasm in 09/02/10.  . Back surgery    . Cardiac catheterization  09/02/2010    mod. nonobstructive disease in the RCA and CX, tortuous LAD  . Nm myocar perf wall motion  01/25/2008    mild anteroapical wall ischemia  . Loop recorder implant  04/06/2012    Reveal XT 4529  . Loop recorder implant N/A 04/06/2012    Procedure: LOOP RECORDER IMPLANT;  Surgeon: Sanda Klein, MD;  Location: [facility] CATH LAB;  Service: Cardiovascular;  Laterality: N/A;  . Loop recorder explant N/A 01/14/2014    Procedure: LOOP  RECORDER EXPLANT;  Surgeon: Sanda Klein, MD;  Location: [place] CATH [facility];  Service: Cardiovascular;  Laterality: N/A;   Past Medical History  Diagnosis Date  . Diabetes mellitus   . Asthma   . Enlarged prostate   . Hyperlipidemia   . Heart disease   . Hypertension   . Blood transfusion   . GERD (gastroesophageal reflux disease)   . Refusal of blood transfusions as patient is Jehovah's Witness   . Neuromuscular disorder     DJD  . Coronary artery disease   . CAD (coronary artery disease), non obstructive on cath 2011 04/05/2012  . H/O syncope    BP 109/73 mmHg  Pulse 98  Resp 14  SpO2 95%  Opioid Risk Score:   Fall Risk Score:  `1  Depression screen PHQ 2/9  Depression screen PHQ 2/9 12/25/2014  Decreased Interest 2  Down, Depressed, Hopeless 1  PHQ - 2 Score 3  Altered sleeping 1  Tired, decreased energy 1  Change in appetite 0  Feeling bad or failure about yourself  2  Trouble concentrating 2  Moving slowly or fidgety/restless 2  Suicidal thoughts 0  PHQ-9 Score 11     Review of Systems  Constitutional:       Night sweats Loss of taste/smell  Musculoskeletal: Positive for gait problem.  Neurological: Positive for dizziness, tremors, weakness and numbness.       Tingling Spasms   Psychiatric/Behavioral: Positive for dysphoric mood. The patient is nervous/anxious.   All other systems reviewed and are negative.      Objective:   Physical Exam  Constitutional: He is oriented to person, place, and time. He appears well-developed and well-nourished.  HENT:  Head: Normocephalic and atraumatic.  Neurological: He is alert and oriented to person, place, and time.  Psychiatric: He has a normal mood and affect.  Nursing note and vitals reviewed.   Gen. No acute distress, mood and affect are appropriate His right hip has good range of motion has some pain with external rotation but this is mainly in the low back area around the PSIS Negative straight leg  raise Negative femoral stretch test Patient has no evidence of inguinal hernia on the right or left side No evidence of hernia in the obturator area Abdomen has mild guarding soft nondistended  Sensation intact to light touch bilateral inguinal areas as well as L2-L3 areas        Assessment & Plan:  1. Right groin pain, difficult to fully explain all of his symptoms. Does not appear to have inguinal hernia. Differential includes L1 or L2 radiculitis, ilioinguinal neuropathy of unknown etiology Femoral acetabular impingement although this is not clearcut on examination At this point he has an appointment to see a chiropractor, I think he can try this for a few weeks  that if no improvement would recommend MRI to assess for upper lumbar nerve root compression Discussed with patient agrees with plan Over half of the 25 min visit was spent counseling and coordinating care.Reviewed ER notes, went through differential, went through treatment options

## 2015-01-19 NOTE — Patient Instructions (Signed)
Try chiropractic for about 3 weeks, if is not helping we will need to get an MRI will discuss at her next visit

## 2015-01-20 ENCOUNTER — Emergency Department (HOSPITAL_COMMUNITY): Payer: Medicare Other

## 2015-01-20 ENCOUNTER — Emergency Department (HOSPITAL_COMMUNITY)
Admission: EM | Admit: 2015-01-20 | Discharge: 2015-01-20 | Disposition: A | Discharge status: Home / Self Care | Payer: Medicare Other | Attending: Emergency Medicine | Admitting: Emergency Medicine

## 2015-01-20 ENCOUNTER — Encounter (HOSPITAL_COMMUNITY): Payer: Self-pay

## 2015-01-20 DIAGNOSIS — J45901 Unspecified asthma with (acute) exacerbation: Secondary | ICD-10-CM | POA: Diagnosis not present

## 2015-01-20 DIAGNOSIS — Z791 Long term (current) use of non-steroidal anti-inflammatories (NSAID): Secondary | ICD-10-CM | POA: Insufficient documentation

## 2015-01-20 DIAGNOSIS — Z87448 Personal history of other diseases of urinary system: Secondary | ICD-10-CM | POA: Diagnosis not present

## 2015-01-20 DIAGNOSIS — R0789 Other chest pain: Secondary | ICD-10-CM | POA: Insufficient documentation

## 2015-01-20 DIAGNOSIS — E785 Hyperlipidemia, unspecified: Secondary | ICD-10-CM | POA: Diagnosis not present

## 2015-01-20 DIAGNOSIS — Z8669 Personal history of other diseases of the nervous system and sense organs: Secondary | ICD-10-CM | POA: Diagnosis not present

## 2015-01-20 DIAGNOSIS — K219 Gastro-esophageal reflux disease without esophagitis: Secondary | ICD-10-CM | POA: Diagnosis not present

## 2015-01-20 DIAGNOSIS — I10 Essential (primary) hypertension: Secondary | ICD-10-CM | POA: Diagnosis not present

## 2015-01-20 DIAGNOSIS — I2510 Atherosclerotic heart disease of native coronary artery without angina pectoris: Secondary | ICD-10-CM | POA: Diagnosis not present

## 2015-01-20 DIAGNOSIS — Z7951 Long term (current) use of inhaled steroids: Secondary | ICD-10-CM | POA: Insufficient documentation

## 2015-01-20 DIAGNOSIS — E119 Type 2 diabetes mellitus without complications: Secondary | ICD-10-CM | POA: Insufficient documentation

## 2015-01-20 DIAGNOSIS — Z792 Long term (current) use of antibiotics: Secondary | ICD-10-CM | POA: Diagnosis not present

## 2015-01-20 DIAGNOSIS — Z9889 Other specified postprocedural states: Secondary | ICD-10-CM | POA: Insufficient documentation

## 2015-01-20 DIAGNOSIS — Z79899 Other long term (current) drug therapy: Secondary | ICD-10-CM | POA: Insufficient documentation

## 2015-01-20 DIAGNOSIS — R002 Palpitations: Secondary | ICD-10-CM | POA: Diagnosis not present

## 2015-01-20 DIAGNOSIS — R079 Chest pain, unspecified: Secondary | ICD-10-CM | POA: Diagnosis present

## 2015-01-20 DIAGNOSIS — Z7982 Long term (current) use of aspirin: Secondary | ICD-10-CM | POA: Insufficient documentation

## 2015-01-20 DIAGNOSIS — R61 Generalized hyperhidrosis: Secondary | ICD-10-CM | POA: Insufficient documentation

## 2015-01-20 LAB — CBC
HCT: 39.3 % (ref 39.0–52.0)
Hemoglobin: 13.6 g/dL (ref 13.0–17.0)
MCH: 27 pg (ref 26.0–34.0)
MCHC: 34.6 g/dL (ref 30.0–36.0)
MCV: 78 fL (ref 78.0–100.0)
PLATELETS: 216 10*3/uL (ref 150–400)
RBC: 5.04 MIL/uL (ref 4.22–5.81)
RDW: 15.9 % — ABNORMAL HIGH (ref 11.5–15.5)
WBC: 6.2 10*3/uL (ref 4.0–10.5)

## 2015-01-20 LAB — COMPREHENSIVE METABOLIC PANEL
ALK PHOS: 107 U/L (ref 38–126)
ALT: 46 U/L (ref 17–63)
AST: 38 U/L (ref 15–41)
Albumin: 3.7 g/dL (ref 3.5–5.0)
Anion gap: 11 (ref 5–15)
BILIRUBIN TOTAL: 0.6 mg/dL (ref 0.3–1.2)
BUN: 13 mg/dL (ref 6–20)
CALCIUM: 8.7 mg/dL — AB (ref 8.9–10.3)
CHLORIDE: 100 mmol/L — AB (ref 101–111)
CO2: 22 mmol/L (ref 22–32)
Creatinine, Ser: 0.99 mg/dL (ref 0.61–1.24)
GLUCOSE: 137 mg/dL — AB (ref 70–99)
POTASSIUM: 3.6 mmol/L (ref 3.5–5.1)
SODIUM: 133 mmol/L — AB (ref 135–145)
Total Protein: 6.8 g/dL (ref 6.5–8.1)

## 2015-01-20 LAB — I-STAT TROPONIN, ED
TROPONIN I, POC: 0 ng/mL (ref 0.00–0.08)
TROPONIN I, POC: 0 ng/mL (ref 0.00–0.08)
Troponin i, poc: 0 ng/mL (ref 0.00–0.08)

## 2015-01-20 LAB — BRAIN NATRIURETIC PEPTIDE: B Natriuretic Peptide: 4.6 pg/mL (ref 0.0–100.0)

## 2015-01-20 MED ORDER — GLUCOSE BLOOD VI STRP: ORAL_STRIP | Status: DC

## 2015-01-20 MED ORDER — MORPHINE SULFATE 4 MG/ML IJ SOLN
4.0000 mg | Freq: Once | INTRAMUSCULAR | Status: DC
  Filled 2015-01-20: qty 1

## 2015-01-20 NOTE — ED Notes (Signed)
PA Joe at the bedside.

## 2015-01-20 NOTE — ED Notes (Signed)
Xray at the bedside.

## 2015-01-20 NOTE — ED Provider Notes (Signed)
CSN: [id number]     Arrival date & time 01/20/15  1720 History   First MD Initiated Contact with Patient 01/20/15 1721     Chief Complaint  Patient presents with  . Chest Pain     (Consider location/radiation/quality/duration/timing/severity/associated sxs/prior Treatment) HPI Cody Raymond is a 44 year old male with past medical history of diabetes, hyperlipidemia, heart disease, hypertension, CAD who presents the ER complaining of chest discomfort. Patient reports a sudden onset of left-sided, sharp chest pain while he was sitting and watching TV in his house. Patient states he subsequently took one nitroglycerin and 324 mg of aspirin. He states the pain then became very dull and described this as a "pressure" and he called 911. Patient reports associated diaphoresis, shortness of breath and palpitations during this episode pain. He states the pain radiated from his left chest into his left shoulder and arm. He reports no aggravating factors. EMS reports they gave nitroglycerin 2, which brought patient's pain from a severe level to a tolerable level. On exam patient denies any current shortness of breath, dizziness, headache, blurred vision, weakness, nausea, vomiting, abdominal pain. Patient is currently seen by Dr. Sallyanne Kuster with cardiology. Patient states he being evaluated by cardiology and electrophysiology currently for a possible Brugada syndrome.  Pt denies hx of DVT or PE.  Pt states last travel was approximately one month ago.    Past Medical History  Diagnosis Date  . Diabetes mellitus   . Asthma   . Enlarged prostate   . Hyperlipidemia   . Heart disease   . Hypertension   . Blood transfusion   . GERD (gastroesophageal reflux disease)   . Refusal of blood transfusions as patient is Jehovah's Witness   . Neuromuscular disorder     DJD  . Coronary artery disease   . CAD (coronary artery disease), non obstructive on cath 2011 04/05/2012  . H/O syncope    Past Surgical History   Procedure Laterality Date  . Appendectomy    . Patent ductus arterious repair      at age 9  . Colonoscopy w/ polypectomy  03/17/11    diminutive polyp  . Cardiac catheterization  2007    Clean Cardiac Cath [facility] Cards), with RCA 30% narrowing likely due to catheter induced spasm in 09/02/10.  . Back surgery    . Cardiac catheterization  09/02/2010    mod. nonobstructive disease in the RCA and CX, tortuous LAD  . Nm myocar perf wall motion  01/25/2008    mild anteroapical wall ischemia  . Loop recorder implant  04/06/2012    Reveal XT 4529  . Loop recorder implant N/A 04/06/2012    Procedure: LOOP RECORDER IMPLANT;  Surgeon: Sanda Klein, MD;  Location: [facility] CATH LAB;  Service: Cardiovascular;  Laterality: N/A;  . Loop recorder explant N/A 01/14/2014    Procedure: LOOP RECORDER EXPLANT;  Surgeon: Sanda Klein, MD;  Location: [facility] CATH LAB;  Service: Cardiovascular;  Laterality: N/A;   Family History  Problem Relation Age of Onset  . Colon cancer Paternal Grandfather   . Prostate cancer Paternal Grandfather   . Aneurysm Father   . Heart attack Father 45  . Coronary artery disease Father   . Colon cancer Paternal Uncle     x 2  . Colon polyps Mother   . Heart disease Mother   . Coronary artery disease Mother   . Breast cancer Maternal Grandmother   . Stomach cancer Brother   . Liver disease  unsure who it was  . Allergies Daughter    History  Substance Use Topics  . Smoking status: Never Smoker   . Smokeless tobacco: Never Used  . Alcohol Use: Yes     Comment: rare    Review of Systems  Constitutional: Negative for fever.  HENT: Negative for trouble swallowing.   Eyes: Negative for visual disturbance.  Respiratory: Positive for shortness of breath.   Cardiovascular: Positive for chest pain and palpitations.  Gastrointestinal: Negative for nausea, vomiting and abdominal pain.  Genitourinary: Negative for dysuria.  Musculoskeletal: Negative for neck pain.   Skin: Negative for rash.  Neurological: Negative for dizziness, weakness and numbness.  Psychiatric/Behavioral: Negative.       Allergies  Benadryl and Red blood cells  Home Medications   Prior to Admission medications   Medication Sig Start Date End Date Taking? Authorizing Provider  albuterol (PROVENTIL HFA;VENTOLIN HFA) 108 (90 BASE) MCG/ACT inhaler Inhale 2 puffs into the lungs every 6 (six) hours as needed for wheezing or shortness of breath. Use with spacer, for asthma 03/07/13  Yes [name], NP  albuterol (PROVENTIL) (2.5 MG/3ML) 0.083% nebulizer solution Take 3 mLs (2.5 mg total) by nebulization every 6 (six) hours as needed for wheezing or shortness of breath. For wheezing 11/03/14  Yes [name], NP  aspirin 325 MG tablet Take 325 mg by mouth daily.   Yes Historical Provider, MD  budesonide-formoterol (SYMBICORT) 160-4.5 MCG/ACT inhaler Inhale 2 puffs into the lungs 2 (two) times daily. 10/13/14  Yes [name], NP  cetirizine (ZYRTEC) 10 MG tablet Take 10 mg by mouth daily.   Yes Historical Provider, MD  ciprofloxacin (CIPRO) 500 MG tablet Take 1 tablet (500 mg total) by mouth 2 (two) times daily. Patient taking differently: Take 500 mg by mouth 2 (two) times daily. Has one more dose left per patient 01/18/15  Yes [name], NP  clonazePAM (KLONOPIN) 1 MG tablet Take 1 tablet (1 mg total) by mouth daily. 01/16/15  Yes [name], NP  diclofenac sodium (VOLTAREN) 1 % GEL Apply 2 g topically 4 (four) times daily. 11/01/13  Yes Charlett Blake, MD  fenofibrate (TRICOR) 145 MG tablet Take 1 tablet (145 mg total) by mouth daily. 11/06/14  Yes [name], NP  fish oil-omega-3 fatty acids 1000 MG capsule Take 2 g by mouth daily.    Yes Historical Provider, MD  fluocinonide gel (LIDEX) 0.05 % Apply sparingly to scalp twice daily as needed 10/13/14  Yes [name], NP  gabapentin (NEURONTIN) 300 MG capsule Take 300 mg by mouth at 6  am, take 300 mg by mouth at 11:30 am, take 300 mg by mouth at 4:30 pm, take 600 mg by mouth at 10 pm. Patient taking differently: Take 300 mg by mouth at 6 am, take 300 mg by mouth at 11:30 am, take 300 mg by mouth at 4:30 pm, take 900 mg by mouth at 10 pm. 11/06/14  Yes [name], NP  ketoconazole (NIZORAL) 2 % shampoo Apply 1 application topically daily. 11/03/14  Yes [name], NP  lansoprazole (PREVACID) 15 MG capsule Take 15 mg by mouth daily.   Yes Historical Provider, MD  lisinopril (PRINIVIL,ZESTRIL) 2.5 MG tablet Take 1 tablet (2.5 mg total) by mouth daily. 11/03/14  Yes [name], NP  metFORMIN (GLUCOPHAGE XR) 500 MG 24 hr tablet Take 1 tablet (500 mg total) by mouth daily with breakfast. 11/03/14  Yes [name], NP  methocarbamol (ROBAXIN) 500 MG tablet Take 1  tablet (500 mg total) by mouth at bedtime and may repeat dose one time if needed. 11/03/14  Yes [name], NP  montelukast (SINGULAIR) 10 MG tablet Take 1 tablet (10 mg total) by mouth at bedtime. 11/06/14  Yes [name], NP  Multiple Vitamin (MULTIVITAMIN WITH MINERALS) TABS tablet Take 1 tablet by mouth daily.   Yes Historical Provider, MD  naproxen sodium (ALEVE) 220 MG tablet Take 440 mg by mouth 2 (two) times daily. Take 440 mg in the morning and 220 mg in the evening.   Yes Historical Provider, MD  nitroGLYCERIN (NITROSTAT) 0.4 MG SL tablet Place 1 tablet (0.4 mg total) under the tongue every 5 (five) minutes as needed for chest pain. 10/13/14  Yes [name], NP  ramelteon (ROZEREM) 8 MG tablet Take 8 mg by mouth at bedtime. 07/23/12  Yes Burnice Logan, MD  rosuvastatin (CRESTOR) 40 MG tablet Take 1 tablet (40 mg total) by mouth daily. 10/13/14  Yes [name], NP  sertraline (ZOLOFT) 50 MG tablet Take 1 tablet (50 mg total) by mouth daily. 01/16/15  Yes [name], NP  tamsulosin (FLOMAX) 0.4 MG CAPS capsule Take 1 capsule (0.4 mg total) by mouth daily.  11/06/14  Yes [name], NP  traMADol (ULTRAM) 50 MG tablet Take 1 tablet (50 mg total) by mouth every 6 (six) hours as needed. Patient taking differently: Take 50 mg by mouth every 6 (six) hours as needed for moderate pain.  11/17/14  Yes Charlett Blake, MD  traZODone (DESYREL) 100 MG tablet Take 1 tablet (100 mg total) by mouth at bedtime. 11/06/14  Yes [name], NP  zolpidem (AMBIEN) 10 MG tablet Take 1 tablet (10 mg total) by mouth at bedtime as needed for sleep. 10/13/14  Yes [name], NP  diazepam (VALIUM) 10 MG tablet Take 1 tablet (10 mg total) by mouth once. Patient not taking: Reported on 01/20/2015 11/25/14   Deboraha Sprang, MD  glucose blood [facility] VERIO) test strip Use as instructed to check blood sugar three times daily. 01/20/15   [name], NP   BP 126/87 mmHg  Pulse 79  Temp(Src) 98.1 F (36.7 C) (Oral)  Resp 10  Ht 5\' 8"  (1.727 m)  Wt 217 lb (98.431 kg)  BMI 33.00 kg/m2  SpO2 98% Physical Exam  Constitutional: He is oriented to person, place, and time. He appears well-developed and well-nourished. No distress.  HENT:  Head: Normocephalic and atraumatic.  Mouth/Throat: Oropharynx is clear and moist. No oropharyngeal exudate.  Eyes: EOM are normal. Pupils are equal, round, and reactive to light. Right eye exhibits no discharge. Left eye exhibits no discharge. No scleral icterus.  Neck: Normal range of motion.  Cardiovascular: Normal rate, regular rhythm, S1 normal, S2 normal, normal heart sounds and normal pulses.   No murmur heard. Pulmonary/Chest: Effort normal. No accessory muscle usage. No tachypnea. No respiratory distress. He has wheezes in the right upper field and the left upper field.  Mild wheezes noted in upper lobes bilaterally. No respiratory distress noted. Patient speaking in full, clear sentences.  Abdominal: Soft. Normal appearance and bowel sounds are normal. There is no tenderness. There is no rigidity, no guarding, no  tenderness at McBurney's point and negative Murphy's sign.  Musculoskeletal: Normal range of motion. He exhibits no edema or tenderness.  Neurological: He is alert and oriented to person, place, and time. He has normal strength. No cranial nerve deficit or sensory deficit. He displays a negative Romberg sign. Coordination normal.  GCS eye subscore is 4. GCS verbal subscore is 5. GCS motor subscore is 6.  Patient fully alert, answering questions appropriately in full, clear sentences. Cranial nerves II through XII grossly intact. Motor strength 5 out of 5 in all major muscle groups of upper and lower extremities. Distal sensation intact.   Skin: Skin is warm and dry. No rash noted. He is not diaphoretic.  Psychiatric: He has a normal mood and affect.  Nursing note and vitals reviewed.   ED Course  Procedures (including critical care time) Labs Review Labs Reviewed  CBC - Abnormal; Notable for the following:    RDW 15.9 (*)    All other components within normal limits  COMPREHENSIVE METABOLIC PANEL - Abnormal; Notable for the following:    Sodium 133 (*)    Chloride 100 (*)    Glucose, Bld 137 (*)    Calcium 8.7 (*)    All other components within normal limits  BRAIN NATRIURETIC PEPTIDE  I-STAT TROPOININ, ED  I-STAT TROPOININ, ED  Randolm Idol, ED    Imaging Review Dg Chest Port 1 View  01/20/2015   CLINICAL DATA:  Left chest pain with pressure and left shoulder pain today. History of asthma, hypertension and diabetes. Initial encounter.  EXAM: PORTABLE CHEST - 1 VIEW  COMPARISON:  07/23/2012 and 04/05/2012 radiographs.  FINDINGS: 1751 hours. The heart size and mediastinal contours are normal. The lungs are clear. There is no pleural effusion or pneumothorax. No acute osseous findings are identified. Telemetry leads overlie the chest.  IMPRESSION: No active cardiopulmonary process.   Electronically Signed   By: Richardean Sale M.D.   On: 01/20/2015 17:57     EKG  Interpretation   Date/Time:  Tuesday Jan 20 2015 17:27:19 EDT Ventricular Rate:  90 PR Interval:  138 QRS Duration: 109 QT Interval:  365 QTC Calculation: 447 R Axis:   -173 Text Interpretation:  Sinus rhythm Probable right ventricular hypertrophy  ST elev, probable normal early repol pattern Incomplete right bundle  branch block No significant change since last tracing Confirmed by [facility]   MD, MARTHA [phone]) on 01/20/2015 6:59:44 PM      MDM   Final diagnoses:  Atypical chest pain     Chest pain is atypical in nature, perc negative, VSS, no tracheal deviation, no JVD or new murmur, RRR, breath sounds equal bilaterally, EKG without acute abnormalities, 2, negative troponins, and negative CXR. Patient pain-free after 1 dose of morphine given initially here. I spoke with Dr. Aundra Dubin with cardiology in consultation of patient's case. Dr. Aundra Dubin agrees that with a low heart score of less than or equal to 3, and 2 negative troponins, patient being pain-free, he can be discharged and should follow-up with cardiology within one week.  Pt appears reliable for follow up and states he much preferred to go home versus being admitted for ACS rule out when I offer him this option. Pt given strict return precautions, has been advised return to the ED is CP becomes exertional, associated with diaphoresis or nausea, radiates to left jaw/arm, worsens or becomes concerning in any way. Encouraged patient to call or return to ER if any worsening symptoms or should any questions or concerns.  BP 126/87 mmHg  Pulse 79  Temp(Src) 98.1 F (36.7 C) (Oral)  Resp 10  Ht 5\' 8"  (1.727 m)  Wt 217 lb (98.431 kg)  BMI 33.00 kg/m2  SpO2 98%  Signed,  Dahlia Bailiff, PA-C 2:28 AM  Patient discussed with Dr. Jana Half  Canary Brim, M.D.  Dahlia Bailiff, PA-C 01/21/15 [id number]  Alfonzo Beers, MD 01/21/15 [phone]

## 2015-01-20 NOTE — ED Notes (Signed)
Per EMS, Patient is coming from with sudden onset sharp chest pain in the left side of the chest with diaphoresis. Patient states the pain started as sharp and changed to tightness. Patient took 324 mg of Aspirin and patient took 1 Nitro at home. EMS gave patient two Nitro during transport with no relief. Patient denies any SOB, Dizziness, or nausea. Vitals per EMS: Initial 18/96 before Nitro, 100/57, ST 102 HR, 100% on 2L, 111 CBG.

## 2015-01-20 NOTE — ED Notes (Signed)
Phlebotomy at the bedside  

## 2015-01-20 NOTE — Telephone Encounter (Signed)
Notified pt of below recommendations and he is agreeable to proceed with Lipid clinic referral. He reports that he checks his blood sugar three times daily. Rx sent.

## 2015-01-20 NOTE — ED Notes (Signed)
Pt denies chest pain or tightness. Given ginger ale per PA.

## 2015-01-20 NOTE — ED Notes (Signed)
Pt c/o heartburn; hx of same and takes prevacid at home. Pt denies chest pain at present. PA notified

## 2015-01-20 NOTE — ED Notes (Signed)
PA at the bedside.

## 2015-01-20 NOTE — Discharge Instructions (Signed)

## 2015-01-22 MED ORDER — CIPROFLOXACIN HCL 500 MG PO TABS: 500.0000 mg | ORAL_TABLET | Freq: Two times a day (BID) | ORAL | Status: DC

## 2015-01-22 NOTE — Addendum Note (Signed)
Addended by: [name] on: 01/22/2015 08:41 AM   Modules accepted: Orders

## 2015-01-24 ENCOUNTER — Encounter: Payer: Self-pay | Admitting: Family

## 2015-02-12 ENCOUNTER — Ambulatory Visit: Payer: Self-pay | Admitting: Physical Medicine & Rehabilitation

## 2015-02-20 ENCOUNTER — Ambulatory Visit: Payer: Self-pay | Admitting: Physical Medicine & Rehabilitation

## 2015-02-20 ENCOUNTER — Ambulatory Visit: Payer: Medicare Other

## 2015-03-09 ENCOUNTER — Other Ambulatory Visit: Payer: Self-pay | Admitting: Family

## 2015-03-09 ENCOUNTER — Telehealth: Payer: Self-pay | Admitting: Family

## 2015-03-09 MED ORDER — LISINOPRIL 2.5 MG PO TABS: 2.5000 mg | ORAL_TABLET | Freq: Every day | ORAL | Status: DC

## 2015-03-09 MED ORDER — GABAPENTIN 300 MG PO CAPS: ORAL_CAPSULE | ORAL | Status: DC

## 2015-03-09 MED ORDER — METFORMIN HCL ER 500 MG PO TB24: 500.0000 mg | ORAL_TABLET | Freq: Every day | ORAL | Status: DC

## 2015-03-09 MED ORDER — SERTRALINE HCL 50 MG PO TABS: 50.0000 mg | ORAL_TABLET | Freq: Every day | ORAL | Status: DC

## 2015-03-09 MED ORDER — MONTELUKAST SODIUM 10 MG PO TABS: 10.0000 mg | ORAL_TABLET | Freq: Every day | ORAL | Status: DC

## 2015-03-09 MED ORDER — TRAZODONE HCL 100 MG PO TABS: 100.0000 mg | ORAL_TABLET | Freq: Every day | ORAL | Status: DC

## 2015-03-09 NOTE — Telephone Encounter (Signed)
Caller name: Deray Relation to pt: self Call back number: [phone] [facility]acy: [facility] [address]  Reason for call:   Metformin, lisinopril, trazodone, zestril, gabapentin, singulari(generic), zoloft. Needs to be sent to local pharmacy. Emergency supply

## 2015-03-09 NOTE — Telephone Encounter (Signed)
2 week supply of each medication listed below sent to pharmacy.

## 2015-03-13 ENCOUNTER — Ambulatory Visit: Payer: Medicare Other

## 2015-03-13 ENCOUNTER — Ambulatory Visit: Payer: Self-pay | Admitting: Physical Medicine & Rehabilitation

## 2015-03-16 ENCOUNTER — Other Ambulatory Visit: Payer: Self-pay

## 2015-03-26 ENCOUNTER — Other Ambulatory Visit: Payer: Self-pay | Admitting: Family

## 2015-03-26 ENCOUNTER — Encounter: Payer: Self-pay | Admitting: Family

## 2015-03-27 ENCOUNTER — Other Ambulatory Visit: Payer: Self-pay | Admitting: *Deleted

## 2015-03-27 MED ORDER — TRAMADOL HCL 50 MG PO TABS: 50.0000 mg | ORAL_TABLET | Freq: Four times a day (QID) | ORAL | Status: DC | PRN

## 2015-03-27 NOTE — Telephone Encounter (Signed)
Called refill to [facility].  Requested 90 day supply with 1 rf, approved.

## 2015-04-17 ENCOUNTER — Encounter: Payer: Self-pay | Admitting: Family

## 2015-04-17 ENCOUNTER — Telehealth: Payer: Self-pay | Admitting: Family

## 2015-04-17 ENCOUNTER — Ambulatory Visit (INDEPENDENT_AMBULATORY_CARE_PROVIDER_SITE_OTHER): Payer: Medicare Other | Admitting: Family

## 2015-04-17 VITALS — BP 100/80 | HR 89 | Temp 97.7°F | Resp 16 | Ht 69.0 in | Wt 217.6 lb

## 2015-04-17 DIAGNOSIS — R42 Dizziness and giddiness: Secondary | ICD-10-CM

## 2015-04-17 DIAGNOSIS — M25521 Pain in right elbow: Secondary | ICD-10-CM

## 2015-04-17 DIAGNOSIS — R300 Dysuria: Secondary | ICD-10-CM

## 2015-04-17 DIAGNOSIS — R079 Chest pain, unspecified: Secondary | ICD-10-CM | POA: Diagnosis not present

## 2015-04-17 DIAGNOSIS — M25529 Pain in unspecified elbow: Secondary | ICD-10-CM | POA: Insufficient documentation

## 2015-04-17 DIAGNOSIS — E119 Type 2 diabetes mellitus without complications: Secondary | ICD-10-CM

## 2015-04-17 LAB — LIPID PANEL
Cholesterol: 198 mg/dL (ref 0–200)
HDL: 40.5 mg/dL (ref >39.00)
LDL Cholesterol: 122 mg/dL — ABNORMAL HIGH (ref 0–99)
NonHDL: 157.96
TRIGLYCERIDES: 182 mg/dL — AB (ref 0.0–149.0)
Total CHOL/HDL Ratio: 5
VLDL: 36.4 mg/dL (ref 0.0–40.0)

## 2015-04-17 LAB — BASIC METABOLIC PANEL
BUN: 14 mg/dL (ref 6–23)
CALCIUM: 9.2 mg/dL (ref 8.4–10.5)
CO2: 28 mEq/L (ref 19–32)
Chloride: 103 mEq/L (ref 96–112)
Creatinine, Ser: 0.97 mg/dL (ref 0.40–1.50)
GFR: 89.19 mL/min (ref >60.00)
Glucose, Bld: 110 mg/dL — ABNORMAL HIGH (ref 70–99)
Potassium: 4.1 mEq/L (ref 3.5–5.1)
SODIUM: 139 meq/L (ref 135–145)

## 2015-04-17 LAB — CBC WITH DIFFERENTIAL/PLATELET
BASOS PCT: 0.5 % (ref 0.0–3.0)
Basophils Absolute: 0 10*3/uL (ref 0.0–0.1)
EOS PCT: 1.4 % (ref 0.0–5.0)
Eosinophils Absolute: 0.1 10*3/uL (ref 0.0–0.7)
HCT: 42.5 % (ref 39.0–52.0)
HEMOGLOBIN: 14.2 g/dL (ref 13.0–17.0)
Lymphocytes Relative: 23.8 % (ref 12.0–46.0)
Lymphs Abs: 1.2 10*3/uL (ref 0.7–4.0)
MCHC: 33.5 g/dL (ref 30.0–36.0)
MCV: 80.5 fl (ref 78.0–100.0)
MONOS PCT: 6.7 % (ref 3.0–12.0)
Monocytes Absolute: 0.3 10*3/uL (ref 0.1–1.0)
NEUTROS PCT: 67.6 % (ref 43.0–77.0)
Neutro Abs: 3.5 10*3/uL (ref 1.4–7.7)
PLATELETS: 269 10*3/uL (ref 150.0–400.0)
RBC: 5.28 Mil/uL (ref 4.22–5.81)
RDW: 16.3 % — ABNORMAL HIGH (ref 11.5–15.5)
WBC: 5.1 10*3/uL (ref 4.0–10.5)

## 2015-04-17 LAB — HEMOGLOBIN A1C: HEMOGLOBIN A1C: 5.8 % (ref 4.6–6.5)

## 2015-04-17 MED ORDER — MELOXICAM 7.5 MG PO TABS: 7.5000 mg | ORAL_TABLET | Freq: Every day | ORAL | Status: DC

## 2015-04-17 NOTE — Patient Instructions (Addendum)
Continue to ice your right elbow twice daily. Start meloxicam once daily. Stop lisinopril. Drink lots of water (at least 64 oz a day) Go to the ER if worsening dizziness or chest pain. Schedule follow up with Dr. Stanford Breed and Dr. Caryl Comes. Follow up in 1-2 weeks.

## 2015-04-17 NOTE — Telephone Encounter (Signed)
Spoke with pt. Realized that I did not place urinalysis order at today's visit.  Apologized for inconvenience and asked pt to return to the lab at his convenience to leave [place] a sample.

## 2015-04-17 NOTE — Assessment & Plan Note (Signed)
rx with meloxicam and icing.  If no improvement plan referral to sports med.

## 2015-04-17 NOTE — Progress Notes (Signed)
Pre visit review using our clinic review tool, if applicable. No additional management support is needed unless otherwise documented below in the visit note. 

## 2015-04-17 NOTE — Progress Notes (Signed)
Subjective:    Patient ID: Cody Raymond, male    DOB: 03/05/1971, 44 y.o.   MRN: [id number]  HPI  Cody Raymond is a 44 yr old male who presents today for follow up.  He has some concerns today:  1) Swollen gland- behind the right ear x 2 days.  Has been scratching his scalp.    2) R arm pain x 2 weeks. Pain is located overlying the right anterior elbow.  Worse with flexion/extension or gripping a can.  Has used tramadol and aleve prn. Helps some. Icing.   3) Dizziness- with standing x 2 weeks.   4) L sided chest pain since his loop monitor was removed. Was removed about 1 year ago.  Reports left anterior chest wall tenderness.   Notes increased heart rate when he lies on his left side.     Review of Systems See HPI  Notes mild dysuria  Past Medical History  Diagnosis Date  . Diabetes mellitus   . Asthma   . Enlarged prostate   . Hyperlipidemia   . Heart disease   . Hypertension   . Blood transfusion   . GERD (gastroesophageal reflux disease)   . Refusal of blood transfusions as patient is Jehovah's Witness   . Neuromuscular disorder     DJD  . Coronary artery disease   . CAD (coronary artery disease), non obstructive on cath 2011 04/05/2012  . H/O syncope     History   Social History  . Marital Status: Married    Spouse Name: N/A  . Number of Children: N/A  . Years of Education: N/A   Occupational History  . Not on file.   Social History Main Topics  . Smoking status: Never Smoker   . Smokeless tobacco: Never Used  . Alcohol Use: Yes     Comment: rare  . Drug Use: No  . Sexual Activity: Not on file   Other Topics Concern  . Not on file   Social History Narrative   Holter monitor 08/2010: PVCs and sinus tachy.   Sleep Study (02/2008): mild sleep apnea, no indication for CPAP.    Past Surgical History  Procedure Laterality Date  . Appendectomy    . Patent ductus arterious repair      at age 52  . Colonoscopy w/ polypectomy  03/17/11   diminutive polyp  . Cardiac catheterization  2007    Clean Cardiac Cath [facility] Cards), with RCA 30% narrowing likely due to catheter induced spasm in 09/02/10.  . Back surgery    . Cardiac catheterization  09/02/2010    mod. nonobstructive disease in the RCA and CX, tortuous LAD  . Nm myocar perf wall motion  01/25/2008    mild anteroapical wall ischemia  . Loop recorder implant  04/06/2012    Reveal XT 4529  . Loop recorder implant N/A 04/06/2012    Procedure: LOOP RECORDER IMPLANT;  Surgeon: Sanda Klein, MD;  Location: [place] CATH LAB;  Service: Cardiovascular;  Laterality: N/A;  . Loop recorder explant N/A 01/14/2014    Procedure: LOOP RECORDER EXPLANT;  Surgeon: Sanda Klein, MD;  Location: [place] CATH LAB;  Service: Cardiovascular;  Laterality: N/A;    Family History  Problem Relation Age of Onset  . Colon cancer Paternal Grandfather   . Prostate cancer Paternal Grandfather   . Aneurysm Father   . Heart attack Father 61  . Coronary artery disease Father   . Colon cancer Paternal Uncle     x  2  . Colon polyps Mother   . Heart disease Mother   . Coronary artery disease Mother   . Breast cancer Maternal Grandmother   . Stomach cancer Brother   . Liver disease      unsure who it was  . Allergies Daughter     Allergies  Allergen Reactions  . Benadryl [Diphenhydramine Hcl]     "drives me nuts"  . Red Blood Cells     PT. REFUSES ANY BLOOD PRODUCTS - JEHOVAH'S WITNESS.    Current Outpatient Prescriptions on File Prior to Visit  Medication Sig Dispense Refill  . albuterol (PROVENTIL) (2.5 MG/3ML) 0.083% nebulizer solution USE 1 VIAL VIA NEBULIZER  EVERY 6 HOURS AS NEEDED FOR WHEEZING OR SHORTNESS OF  BREATH 300 mL 1  . aspirin 325 MG tablet Take 325 mg by mouth daily.    . budesonide-formoterol (SYMBICORT) 160-4.5 MCG/ACT inhaler Inhale 2 puffs into the lungs 2 (two) times daily. 3 Inhaler 1  . cetirizine (ZYRTEC) 10 MG tablet Take 10 mg by mouth daily.    . ciprofloxacin  (CIPRO) 500 MG tablet Take 1 tablet (500 mg total) by mouth 2 (two) times daily. 6 tablet 0  . clonazePAM (KLONOPIN) 1 MG tablet Take 1 tablet (1 mg total) by mouth daily. 30 tablet 0  . diazepam (VALIUM) 10 MG tablet Take 1 tablet (10 mg total) by mouth once. 1 tablet 0  . diclofenac sodium (VOLTAREN) 1 % GEL Apply 2 g topically 4 (four) times daily.    . fenofibrate (TRICOR) 145 MG tablet Take 1 tablet by mouth  daily 90 tablet 1  . fish oil-omega-3 fatty acids 1000 MG capsule Take 2 g by mouth daily.     . fluocinonide gel (LIDEX) 0.05 % Apply sparingly to scalp twice daily as needed 60 g 2  . gabapentin (NEURONTIN) 300 MG capsule Take 1 capsule by mouth at 6am, 11:30am, 4:30pm and take 3 capsules at 10pm. 540 capsule 1  . glucose blood (ONETOUCH VERIO) test strip Use as instructed to check blood sugar three times daily. 300 each 1  . ketoconazole (NIZORAL) 2 % shampoo Apply 1 application  topically daily as directed 360 mL 1  . lansoprazole (PREVACID) 15 MG capsule Take 15 mg by mouth daily.    . metFORMIN (GLUCOPHAGE-XR) 500 MG 24 hr tablet Take 1 tablet by mouth  daily with breakfast 90 tablet 1  . methocarbamol (ROBAXIN) 500 MG tablet Take 1 tablet (500 mg total) by mouth at bedtime and may repeat dose one time if needed. 90 tablet 1  . montelukast (SINGULAIR) 10 MG tablet Take 1 tablet by mouth at  bedtime 90 tablet 1  . Multiple Vitamin (MULTIVITAMIN WITH MINERALS) TABS tablet Take 1 tablet by mouth daily.    . naproxen sodium (ALEVE) 220 MG tablet Take 440 mg by mouth 2 (two) times daily. Take 440 mg in the morning and 220 mg in the evening.    . nitroGLYCERIN (NITROSTAT) 0.4 MG SL tablet Place 1 tablet (0.4 mg total) under the tongue every 5 (five) minutes as needed for chest pain. 30 tablet 1  . ramelteon (ROZEREM) 8 MG tablet Take 8 mg by mouth at bedtime.    . rosuvastatin (CRESTOR) 40 MG tablet Take 1 tablet (40 mg total) by mouth daily. 90 tablet 3  . sertraline (ZOLOFT) 50 MG  tablet Take 1 tablet (50 mg total) by mouth daily. 14 tablet 0  . tamsulosin (FLOMAX) 0.4 MG CAPS capsule  Take 1 capsule by mouth  daily 90 capsule 1  . traMADol (ULTRAM) 50 MG tablet Take 1 tablet (50 mg total) by mouth every 6 (six) hours as needed. 360 tablet 1  . traZODone (DESYREL) 100 MG tablet Take 1 tablet by mouth at  bedtime 90 tablet 1  . zolpidem (AMBIEN) 10 MG tablet Take 1 tablet (10 mg total) by mouth at bedtime as needed for sleep. 30 tablet 0  . [DISCONTINUED] niacin (NIASPAN) 1000 MG CR tablet Take 1 tablet (1,000 mg total) by mouth at bedtime. 30 tablet 6   No current facility-administered medications on file prior to visit.    BP 100/80 mmHg  Pulse 89  Temp(Src) 97.7 F (36.5 C) (Oral)  Resp 16  Ht 5\' 9"  (1.753 m)  Wt 217 lb 9.6 oz (98.703 kg)  BMI 32.12 kg/m2  SpO2 98%       Objective:   Physical Exam  Constitutional: He is oriented to person, place, and time. He appears well-developed and well-nourished. No distress.  HENT:  Head: Normocephalic and atraumatic.  Neck:  Palpable lymph node behind ear (pea sized)  Cardiovascular: Normal rate and regular rhythm.   No murmur heard. Pulmonary/Chest: Effort normal and breath sounds normal. No respiratory distress. He has no wheezes. He has no rales.  Musculoskeletal: He exhibits no edema.  R elbow without swelling Left anterior chest tenderness to palpation overlying surgical scar from loop recorder   Neurological: He is alert and oriented to person, place, and time.  Skin: Skin is warm and dry.  Large pimple noted right cheek  Psychiatric: He has a normal mood and affect. His behavior is normal. Thought content normal.          Assessment & Plan:  Dizziness- recommend d/c ACE inhibitor due to low BP. Increase fluid intake.   Chest pain- pt has hx of non-obstructive CAD.  Reproducible chest pain today. EKG unchanged compared to previous EKG from January. . Notes incomplete RBBB.  Advised pt to arrange  follow up with cardiology and to follow through with MRI recommended by Dr. Caryl Comes to rule out Brugada syndrome. Pt would like open MRI- I did send a staff message to Dr. Caryl Comes.   Dysuria- advised pt to leave [place] a urine sample to rule out UTI. See phone note 7/29.

## 2015-04-19 ENCOUNTER — Telehealth: Payer: Self-pay | Admitting: Family

## 2015-04-19 NOTE — Telephone Encounter (Signed)
See mychart.  

## 2015-04-27 ENCOUNTER — Encounter: Payer: Self-pay | Admitting: Registered Nurse

## 2015-04-27 ENCOUNTER — Encounter: Payer: Medicare Other | Attending: Physical Medicine & Rehabilitation | Admitting: Registered Nurse

## 2015-04-27 VITALS — BP 130/70 | HR 96 | Resp 14

## 2015-04-27 DIAGNOSIS — M549 Dorsalgia, unspecified: Secondary | ICD-10-CM

## 2015-04-27 DIAGNOSIS — G8929 Other chronic pain: Secondary | ICD-10-CM

## 2015-04-27 DIAGNOSIS — M7061 Trochanteric bursitis, right hip: Secondary | ICD-10-CM

## 2015-04-27 DIAGNOSIS — M5416 Radiculopathy, lumbar region: Secondary | ICD-10-CM | POA: Insufficient documentation

## 2015-04-27 DIAGNOSIS — M961 Postlaminectomy syndrome, not elsewhere classified: Secondary | ICD-10-CM | POA: Insufficient documentation

## 2015-04-27 NOTE — Progress Notes (Signed)
Subjective:    Patient ID: Cody Raymond, male    DOB: 1971/08/16, 44 y.o.   MRN: [id number]  HPI: [name] is a 44 year old male who returns for follow up for chronic and medication refill. He says his pain is located in lower back radiating into his lower extremities anterioraly and posteriorly and right hip. He rates his pain 8. His current exercise regime is walking daily for 15-20 minutes.  Pain Inventory Average Pain 10 Pain Right Now 8 My pain is constant, sharp, burning, dull, stabbing, tingling and aching  In the last 24 hours, has pain interfered with the following? General activity 9 Relation with others 7 Enjoyment of life 7 What TIME of day is your pain at its worst? ALL Sleep (in general) Fair  Pain is worse with: walking, bending, sitting and standing Pain improves with: rest, heat/ice and medication Relief from Meds: 3  Mobility walk without assistance how many minutes can you walk? 10-15 ability to climb steps?  no do you drive?  yes  Function disabled: date disabled .  Neuro/Psych bladder control problems weakness numbness tingling spasms dizziness confusion depression anxiety  Prior Studies Any changes since last visit?  no  Physicians involved in your care Any changes since last visit?  no   Family History  Problem Relation Age of Onset  . Colon cancer Paternal Grandfather   . Prostate cancer Paternal Grandfather   . Aneurysm Father   . Heart attack Father 1  . Coronary artery disease Father   . Colon cancer Paternal Uncle     x 2  . Colon polyps Mother   . Heart disease Mother   . Coronary artery disease Mother   . Breast cancer Maternal Grandmother   . Stomach cancer Brother   . Liver disease      unsure who it was  . Allergies Daughter    History   Social History  . Marital Status: Married    Spouse Name: N/A  . Number of Children: N/A  . Years of Education: N/A   Social History Main Topics  . Smoking  status: Never Smoker   . Smokeless tobacco: Never Used  . Alcohol Use: Yes     Comment: rare  . Drug Use: No  . Sexual Activity: Not on file   Other Topics Concern  . None   Social History Narrative   Holter monitor 08/2010: PVCs and sinus tachy.   Sleep Study (02/2008): mild sleep apnea, no indication for CPAP.   Past Surgical History  Procedure Laterality Date  . Appendectomy    . Patent ductus arterious repair      at age 41  . Colonoscopy w/ polypectomy  03/17/11    diminutive polyp  . Cardiac catheterization  2007    Clean Cardiac Cath [facility] Cards), with RCA 30% narrowing likely due to catheter induced spasm in 09/02/10.  . Back surgery    . Cardiac catheterization  09/02/2010    mod. nonobstructive disease in the RCA and CX, tortuous LAD  . Nm myocar perf wall motion  01/25/2008    mild anteroapical wall ischemia  . Loop recorder implant  04/06/2012    Reveal XT 4529  . Loop recorder implant N/A 04/06/2012    Procedure: LOOP RECORDER IMPLANT;  Surgeon: Sanda Klein, MD;  Location: [place] CATH LAB;  Service: Cardiovascular;  Laterality: N/A;  . Loop recorder explant N/A 01/14/2014    Procedure: LOOP RECORDER EXPLANT;  Surgeon: Dani Gobble  Croitoru, MD;  Location: [place] CATH LAB;  Service: Cardiovascular;  Laterality: N/A;   Past Medical History  Diagnosis Date  . Diabetes mellitus   . Asthma   . Enlarged prostate   . Hyperlipidemia   . Heart disease   . Hypertension   . Blood transfusion   . GERD (gastroesophageal reflux disease)   . Refusal of blood transfusions as patient is Jehovah's Witness   . Neuromuscular disorder     DJD  . Coronary artery disease   . CAD (coronary artery disease), non obstructive on cath 2011 04/05/2012  . H/O syncope    BP 130/70 mmHg  Pulse 96  Resp 14  SpO2 97%  Opioid Risk Score:   Fall Risk Score:  `1  Depression screen PHQ 2/9  Depression screen PHQ 2/9 12/25/2014  Decreased Interest 2  Down, Depressed, Hopeless 1  PHQ - 2 Score  3  Altered sleeping 1  Tired, decreased energy 1  Change in appetite 0  Feeling bad or failure about yourself  2  Trouble concentrating 2  Moving slowly or fidgety/restless 2  Suicidal thoughts 0  PHQ-9 Score 11     Review of Systems  HENT: Negative.   Eyes: Negative.   Respiratory: Negative.   Cardiovascular: Negative.   Gastrointestinal: Positive for nausea, abdominal pain, diarrhea and constipation.  Endocrine: Negative.   Genitourinary:       Bladder control problems, painful urination  Musculoskeletal: Positive for myalgias and arthralgias.       Right leg pain  Skin: Negative.   Allergic/Immunologic: Negative.   Neurological: Positive for dizziness, weakness and numbness.       Tingling, spasms  Hematological: Negative.   Psychiatric/Behavioral: Positive for confusion and dysphoric mood. The patient is nervous/anxious.        Objective:   Physical Exam  Constitutional: He is oriented to person, place, and time. He appears well-developed and well-nourished.  HENT:  Head: Normocephalic and atraumatic.  Neck: Normal range of motion. Neck supple.  Cardiovascular: Normal rate and regular rhythm.   Pulmonary/Chest: Effort normal. He has wheezes.  Musculoskeletal:  Normal Muscle Bulk and Muscle testing Reveals: Upper Extremities: Full ROM and Muscle Strength 5/5 Thoracic Paraspinal Tenderness: T-10- T-12 Lumbar Paraspinal Tenderness: L-3- L-5 Lower Extremities: Full ROM and Muscle Strength 5/5 Bilateral Lower Extremities Flexion Produces Pain into Lumbar Arises from chair/ Using straight cane for support Narrow Based gait  Neurological: He is alert and oriented to person, place, and time.  Skin: Skin is warm and dry.  Psychiatric: He has a normal mood and affect.  Nursing note and vitals reviewed.         Assessment & Plan:  1. Right groin pain: No complaint's voiced. 2.L1 or L2 radiculitis, ilioinguinal neuropathy of unknown etiology: Will scheduled MRI as  recommended by Dr. Letta Pate He seen the chiropractor, unsuccessful. Continue Tramadol.  20 minutes of face to face patient care time was spent during this visit. All questions were encouraged and answered.  F/U in 1 month with Dr. Letta Pate.

## 2015-05-01 ENCOUNTER — Ambulatory Visit (INDEPENDENT_AMBULATORY_CARE_PROVIDER_SITE_OTHER): Payer: Medicare Other | Admitting: Family

## 2015-05-01 ENCOUNTER — Encounter: Payer: Self-pay | Admitting: Family

## 2015-05-01 VITALS — BP 112/78 | HR 99 | Temp 98.4°F | Ht 68.0 in | Wt 214.2 lb

## 2015-05-01 DIAGNOSIS — F32A Depression, unspecified: Secondary | ICD-10-CM

## 2015-05-01 DIAGNOSIS — R42 Dizziness and giddiness: Secondary | ICD-10-CM

## 2015-05-01 DIAGNOSIS — F329 Major depressive disorder, single episode, unspecified: Secondary | ICD-10-CM

## 2015-05-01 NOTE — Progress Notes (Signed)
Subjective:    Patient ID: Cody Raymond, male    DOB: 27-Mar-1971, 44 y.o.   MRN: [id number]  HPI  Mr. Walrath is a 44 yr old male who presents today for follow up. Last visit his BP was low and he noted dizziness. ACE was discontinued.  He notes some improvement in his dizziness. Notes that when he does have mild dizziness at times with standing. Report good water intake.  BP Readings from Last 3 Encounters:  05/01/15 112/78  04/27/15 130/70  04/17/15 100/80   Review of Systems    see HPI  Past Medical History  Diagnosis Date  . Diabetes mellitus   . Asthma   . Enlarged prostate   . Hyperlipidemia   . Heart disease   . Hypertension   . Blood transfusion   . GERD (gastroesophageal reflux disease)   . Refusal of blood transfusions as patient is Jehovah's Witness   . Neuromuscular disorder     DJD  . Coronary artery disease   . CAD (coronary artery disease), non obstructive on cath 2011 04/05/2012  . H/O syncope     Social History   Social History  . Marital Status: Married    Spouse Name: N/A  . Number of Children: N/A  . Years of Education: N/A   Occupational History  . Not on file.   Social History Main Topics  . Smoking status: Never Smoker   . Smokeless tobacco: Never Used  . Alcohol Use: Yes     Comment: rare  . Drug Use: No  . Sexual Activity: Not on file   Other Topics Concern  . Not on file   Social History Narrative   Holter monitor 08/2010: PVCs and sinus tachy.   Sleep Study (02/2008): mild sleep apnea, no indication for CPAP.    Past Surgical History  Procedure Laterality Date  . Appendectomy    . Patent ductus arterious repair      at age 79  . Colonoscopy w/ polypectomy  03/17/11    diminutive polyp  . Cardiac catheterization  2007    Clean Cardiac Cath [facility] Cards), with RCA 30% narrowing likely due to catheter induced spasm in 09/02/10.  . Back surgery    . Cardiac catheterization  09/02/2010    mod. nonobstructive  disease in the RCA and CX, tortuous LAD  . Nm myocar perf wall motion  01/25/2008    mild anteroapical wall ischemia  . Loop recorder implant  04/06/2012    Reveal XT 4529  . Loop recorder implant N/A 04/06/2012    Procedure: LOOP RECORDER IMPLANT;  Surgeon: Sanda Klein, MD;  Location: [place] CATH LAB;  Service: Cardiovascular;  Laterality: N/A;  . Loop recorder explant N/A 01/14/2014    Procedure: LOOP RECORDER EXPLANT;  Surgeon: Sanda Klein, MD;  Location: [place] CATH LAB;  Service: Cardiovascular;  Laterality: N/A;    Family History  Problem Relation Age of Onset  . Colon cancer Paternal Grandfather   . Prostate cancer Paternal Grandfather   . Aneurysm Father   . Heart attack Father 70  . Coronary artery disease Father   . Colon cancer Paternal Uncle     x 2  . Colon polyps Mother   . Heart disease Mother   . Coronary artery disease Mother   . Breast cancer Maternal Grandmother   . Stomach cancer Brother   . Liver disease      unsure who it was  . Allergies Daughter  Allergies  Allergen Reactions  . Benadryl [Diphenhydramine Hcl]     "drives me nuts"  . Red Blood Cells     PT. REFUSES ANY BLOOD PRODUCTS - JEHOVAH'S WITNESS.    Current Outpatient Prescriptions on File Prior to Visit  Medication Sig Dispense Refill  . albuterol (PROVENTIL) (2.5 MG/3ML) 0.083% nebulizer solution USE 1 VIAL VIA NEBULIZER  EVERY 6 HOURS AS NEEDED FOR WHEEZING OR SHORTNESS OF  BREATH 300 mL 1  . aspirin 325 MG tablet Take 325 mg by mouth daily.    . budesonide-formoterol (SYMBICORT) 160-4.5 MCG/ACT inhaler Inhale 2 puffs into the lungs 2 (two) times daily. 3 Inhaler 1  . cetirizine (ZYRTEC) 10 MG tablet Take 10 mg by mouth daily.    . ciprofloxacin (CIPRO) 500 MG tablet     . clonazePAM (KLONOPIN) 1 MG tablet Take 1 tablet (1 mg total) by mouth daily. 30 tablet 0  . diazepam (VALIUM) 10 MG tablet Take 1 tablet (10 mg total) by mouth once. 1 tablet 0  . diclofenac sodium (VOLTAREN) 1 % GEL  Apply 2 g topically 4 (four) times daily.    . fenofibrate (TRICOR) 145 MG tablet Take 1 tablet by mouth  daily 90 tablet 1  . fish oil-omega-3 fatty acids 1000 MG capsule Take 2 g by mouth daily.     . fluocinonide gel (LIDEX) 0.05 % Apply sparingly to scalp twice daily as needed 60 g 2  . gabapentin (NEURONTIN) 300 MG capsule Take 1 capsule by mouth at 6am, 11:30am, 4:30pm and take 3 capsules at 10pm. 540 capsule 1  . glucose blood (ONETOUCH VERIO) test strip Use as instructed to check blood sugar three times daily. 300 each 1  . ketoconazole (NIZORAL) 2 % shampoo Apply 1 application  topically daily as directed 360 mL 1  . lansoprazole (PREVACID) 15 MG capsule Take 15 mg by mouth daily.    . meloxicam (MOBIC) 7.5 MG tablet Take 1 tablet (7.5 mg total) by mouth daily. 14 tablet 0  . metFORMIN (GLUCOPHAGE-XR) 500 MG 24 hr tablet Take 1 tablet by mouth  daily with breakfast 90 tablet 1  . methocarbamol (ROBAXIN) 500 MG tablet Take 1 tablet (500 mg total) by mouth at bedtime and may repeat dose one time if needed. 90 tablet 1  . montelukast (SINGULAIR) 10 MG tablet Take 1 tablet by mouth at  bedtime 90 tablet 1  . Multiple Vitamin (MULTIVITAMIN WITH MINERALS) TABS tablet Take 1 tablet by mouth daily.    . naproxen sodium (ALEVE) 220 MG tablet Take 440 mg by mouth 2 (two) times daily. Take 440 mg in the morning and 220 mg in the evening.    . nitroGLYCERIN (NITROSTAT) 0.4 MG SL tablet Place 1 tablet (0.4 mg total) under the tongue every 5 (five) minutes as needed for chest pain. 30 tablet 1  . ramelteon (ROZEREM) 8 MG tablet Take 8 mg by mouth at bedtime.    . rosuvastatin (CRESTOR) 40 MG tablet Take 1 tablet (40 mg total) by mouth daily. 90 tablet 3  . sertraline (ZOLOFT) 50 MG tablet Take 1 tablet (50 mg total) by mouth daily. 14 tablet 0  . tamsulosin (FLOMAX) 0.4 MG CAPS capsule Take 1 capsule by mouth  daily 90 capsule 1  . traMADol (ULTRAM) 50 MG tablet Take 1 tablet (50 mg total) by mouth  every 6 (six) hours as needed. 360 tablet 1  . traZODone (DESYREL) 100 MG tablet Take 1 tablet by mouth at  bedtime 90 tablet 1  . zolpidem (AMBIEN) 10 MG tablet Take 1 tablet (10 mg total) by mouth at bedtime as needed for sleep. 30 tablet 0  . [DISCONTINUED] niacin (NIASPAN) 1000 MG CR tablet Take 1 tablet (1,000 mg total) by mouth at bedtime. 30 tablet 6   No current facility-administered medications on file prior to visit.    BP 112/78 mmHg  Pulse 99  Temp(Src) 98.4 F (36.9 C) (Oral)  Ht 5\' 8"  (1.727 m)  Wt 214 lb 4 oz (97.183 kg)  BMI 32.58 kg/m2  SpO2 98%    Objective:   Physical Exam  Constitutional: He is oriented to person, place, and time. He appears well-developed and well-nourished. No distress.  HENT:  Head: Normocephalic and atraumatic.  Cardiovascular: Normal rate and regular rhythm.   No murmur heard. Pulmonary/Chest: Effort normal and breath sounds normal. No respiratory distress. He has no wheezes. He has no rales.  Musculoskeletal: He exhibits no edema.  Neurological: He is alert and oriented to person, place, and time.  Skin: Skin is warm and dry.  Psychiatric: He has a normal mood and affect. His behavior is normal. Thought content normal.          Assessment & Plan:  Dizziness is improved- advised pt to remain off of ACE, continue good water intake, stand slowly.

## 2015-05-01 NOTE — Assessment & Plan Note (Signed)
Pt wishes to establish with one of our therapists.  Pt was given number to schedule appointment.

## 2015-05-01 NOTE — Patient Instructions (Signed)
Please remain off of lisinopril and make sure that you are drinking adequate water. Please schedule an appointment with behavioral health here at our office.

## 2015-05-01 NOTE — Progress Notes (Signed)
Pre visit review using our clinic review tool, if applicable. No additional management support is needed unless otherwise documented below in the visit note. 

## 2015-05-11 ENCOUNTER — Other Ambulatory Visit: Payer: Self-pay | Admitting: Family

## 2015-05-23 ENCOUNTER — Other Ambulatory Visit: Payer: Self-pay | Admitting: Family

## 2015-05-23 ENCOUNTER — Other Ambulatory Visit: Payer: Self-pay | Admitting: Physician Assistant

## 2015-05-25 NOTE — Telephone Encounter (Signed)
Will defer further refills to PCP. 

## 2015-05-28 ENCOUNTER — Ambulatory Visit: Payer: Medicare Other | Admitting: Physical Medicine & Rehabilitation

## 2015-05-29 ENCOUNTER — Telehealth: Payer: Self-pay | Admitting: *Deleted

## 2015-05-29 NOTE — Telephone Encounter (Signed)
Opened in Error, Rx has already been sent via escript 05/28/15.Marland KitchenSLS

## 2015-06-12 ENCOUNTER — Other Ambulatory Visit: Payer: Self-pay | Admitting: Family

## 2015-06-22 ENCOUNTER — Ambulatory Visit (HOSPITAL_BASED_OUTPATIENT_CLINIC_OR_DEPARTMENT_OTHER): Payer: Medicare Other | Admitting: Physical Medicine & Rehabilitation

## 2015-06-22 ENCOUNTER — Encounter: Payer: Medicare Other | Attending: Physical Medicine & Rehabilitation

## 2015-06-22 ENCOUNTER — Encounter: Payer: Self-pay | Admitting: Physical Medicine & Rehabilitation

## 2015-06-22 VITALS — BP 115/70 | HR 85 | Resp 14

## 2015-06-22 DIAGNOSIS — M5416 Radiculopathy, lumbar region: Secondary | ICD-10-CM

## 2015-06-22 DIAGNOSIS — M222X1 Patellofemoral disorders, right knee: Secondary | ICD-10-CM

## 2015-06-22 DIAGNOSIS — M2142 Flat foot [pes planus] (acquired), left foot: Secondary | ICD-10-CM

## 2015-06-22 DIAGNOSIS — M961 Postlaminectomy syndrome, not elsewhere classified: Secondary | ICD-10-CM

## 2015-06-22 DIAGNOSIS — M2141 Flat foot [pes planus] (acquired), right foot: Secondary | ICD-10-CM

## 2015-06-22 DIAGNOSIS — M222X2 Patellofemoral disorders, left knee: Secondary | ICD-10-CM

## 2015-06-22 NOTE — Progress Notes (Signed)
Subjective:    Patient ID: Cody Raymond, male    DOB: [birth year], 44 y.o.   MRN: [id number]  HPI  44 year old male with history of low back pain and right L4 chronic radiculopathy. He underwent lumbar decompression years ago and has had chronic postoperative pain. He's had increasing pain in the right hip area. Some of this is lateral. He's had a couple days of relief with greater trochanteric bursa injection on the right side. He's also had a day or 2 relief with right L4 transforaminal lumbar epidural injection. Patient complained of increasing low back pain as well as the hip pain. His back pain is the predominant symptom. It increases with activity.  He gets partial relief with tramadol. He is also a diabetic and developing tingling in his feet. His primary care physician has him on gabapentin starting in June of this year. Last visit with me was 01/19/2015.  Since last visit he underwent an open MRI at [facility]. The patient does not have the disc with him but has one at home. [facility] was supposed to send the disc over here but we never received it.  Also complains of popping and grinding bilateral knees. No trauma or falls. Has seen a foot doctor was told that he had flatfeet  Pain Inventory Average Pain 8 Pain Right Now 8 My pain is sharp, burning, dull, stabbing, tingling and aching  In the last 24 hours, has pain interfered with the following? General activity 8 Relation with others 7 Enjoyment of life 7 What TIME of day is your pain at its worst? all Sleep (in general) Fair  Pain is worse with: walking, bending, sitting and standing Pain improves with: medication Relief from Meds: 2  Mobility walk with assistance use a cane how many minutes can you walk? 15 ability to climb steps?  no do you drive?  yes  Function disabled: date disabled . I need assistance with the following:   bathing  Neuro/Psych weakness numbness tingling spasms dizziness confusion depression anxiety  Prior Studies Any changes since last visit?  no  Physicians involved in your care Any changes since last visit?  no   Family History  Problem Relation Age of Onset  . Colon cancer Paternal Grandfather   . Prostate cancer Paternal Grandfather   . Aneurysm Father   . Heart attack Father 77  . Coronary artery disease Father   . Colon cancer Paternal Uncle     x 2  . Colon polyps Mother   . Heart disease Mother   . Coronary artery disease Mother   . Breast cancer Maternal Grandmother   . Stomach cancer Brother   . Liver disease      unsure who it was  . Allergies Daughter    Social History   Social History  . Marital Status: Married    Spouse Name: N/A  . Number of Children: N/A  . Years of Education: N/A   Social History Main Topics  . Smoking status: Never Smoker   . Smokeless tobacco: Never Used  . Alcohol Use: Yes     Comment: rare  . Drug Use: No  . Sexual Activity: Not Asked   Other Topics Concern  . None   Social History Narrative   Holter monitor 08/2010: PVCs and sinus tachy.   Sleep Study (02/2008): mild sleep apnea, no indication for CPAP.   Past Surgical History  Procedure Laterality Date  . Appendectomy    . Patent ductus arterious  repair      at age 29  . Colonoscopy w/ polypectomy  03/17/11    diminutive polyp  . Cardiac catheterization  2007    Clean Cardiac Cath [facility] Cards), with RCA 30% narrowing likely due to catheter induced spasm in 09/02/10.  . Back surgery    . Cardiac catheterization  09/02/2010    mod. nonobstructive disease in the RCA and CX, tortuous LAD  . Nm myocar perf wall motion  01/25/2008    mild anteroapical wall ischemia  . Loop recorder implant  04/06/2012    Reveal XT 4529  . Loop recorder implant N/A 04/06/2012    Procedure: LOOP RECORDER IMPLANT;  Surgeon: Sanda Klein, MD;  Location: [place] [facility];  Service:  Cardiovascular;  Laterality: N/A;  . Loop recorder explant N/A 01/14/2014    Procedure: LOOP RECORDER EXPLANT;  Surgeon: Sanda Klein, MD;  Location: [place] [facility];  Service: Cardiovascular;  Laterality: N/A;   Past Medical History  Diagnosis Date  . Diabetes mellitus   . Asthma   . Enlarged prostate   . Hyperlipidemia   . Heart disease   . Hypertension   . Blood transfusion   . GERD (gastroesophageal reflux disease)   . Refusal of blood transfusions as patient is Jehovah's Witness   . Neuromuscular disorder (HCC)     DJD  . Coronary artery disease   . CAD (coronary artery disease), non obstructive on cath 2011 04/05/2012  . H/O syncope    BP 115/70 mmHg  Pulse 85  Resp 14  SpO2 98%  Opioid Risk Score:   Fall Risk Score:  `1  Depression screen PHQ 2/9  Depression screen PHQ 2/9 12/25/2014  Decreased Interest 2  Down, Depressed, Hopeless 1  PHQ - 2 Score 3  Altered sleeping 1  Tired, decreased energy 1  Change in appetite 0  Feeling bad or failure about yourself  2  Trouble concentrating 2  Moving slowly or fidgety/restless 2  Suicidal thoughts 0  PHQ-9 Score 11     Review of Systems  Gastrointestinal: Positive for nausea, abdominal pain, diarrhea and constipation.  Endocrine:       High blood sugar  Neurological: Positive for dizziness, weakness and numbness.       Tingling Spasms   Psychiatric/Behavioral: Positive for confusion and dysphoric mood. The patient is nervous/anxious.   All other systems reviewed and are negative.      Objective:   Physical Exam  Constitutional: He is oriented to person, place, and time. He appears well-developed and well-nourished.  HENT:  Head: Normocephalic and atraumatic.  Eyes: Conjunctivae and EOM are normal. Pupils are equal, round, and reactive to light.  Neck: Normal range of motion.  Neurological: He is alert and oriented to person, place, and time. He displays no atrophy. Gait abnormal. Coordination normal.  Reflex  Scores:      Tricep reflexes are 2+ on the right side and 2+ on the left side.      Bicep reflexes are 2+ on the right side and 2+ on the left side.      Brachioradialis reflexes are 2+ on the right side and 2+ on the left side.      Patellar reflexes are 2+ on the right side and 2+ on the left side.      Achilles reflexes are 2+ on the right side and 2+ on the left side. Intact light touch bilateral L2 L3 L4 dermatomal distribution  Ambulates with a cane no evidence  of toe drag or knee instability  4/5 strength in the right hip flexor and extensor and ankle dorsiflexor and plantar flexor has pain inhibition Left side is 5/5 strength Negative straight leg raising Negative femoral stretch test Some Right hip pain with internal and external rotation    Psychiatric: He has a normal mood and affect.  Nursing note and vitals reviewed.  This bilateral patellofemoral no evidence of knee effusion no tenderness to palpation. No evidence of knee deformity.  Evidence of pes planus bilaterally normal ankle range of motion without pain       Assessment & Plan:  1. Lumbar postlaminectomy syndrome with chronic right L4 radiculopathy has had increasing low back pain. Repeat MRI was performed but results are pending. Patient will bring by the disc will review Given his clinical presentation I think it is reasonable to schedule for bilateral lumbar medial branch blocks. We'll review MRI prior to the procedure, may make modification based on results.  2. Right hip pain and trochanteric bursitis no clinical signs of OA other than some pain with internal and external rotation of the hip but this is bilaterally. He may benefit from physical therapy for right hip tensor fascia lata strengthening and stretching  3. Patellofemoral disorder bilateral. Risk factors include pes planus. We'll send for orthotic consultation, bilateral foot orthosis to correct  Complex situation with numerous pain generators.  Hip pain may be radicular which is why MRI was ordered however other potential etiologies include trochanteric bursitis or even OA.

## 2015-06-22 NOTE — Patient Instructions (Signed)
Patellofemoral Syndrome If you have had pain in the front of your knee for a long time, chances are good that you have patellofemoral syndrome. The word patella refers to the kneecap. Femoral (or femur) refers to the thigh bone. That is the bone the kneecap sits on. The kneecap is shaped like a triangle. Its job is to protect the knee and to improve the efficiency of your thigh muscles (quadriceps). The underside of the kneecap is made of smooth tissue (cartilage). This lets the kneecap slide up and down as the knee moves. Sometimes this cartilage becomes soft. Your healthcare provider may say the cartilage breaks down. That is patellofemoral syndrome. It can affect one knee, or both. The condition is sometimes called patellofemoral pain syndrome. That is because the condition is painful. The pain usually gets worse with activity. Sitting for a long time with the knee bent also makes the pain worse. It usually gets better with rest and proper treatment. CAUSES  No one is sure why some people develop this problem and others do not. Runners often get it. One name for the condition is "runner's knee." However, some people run for years and never have knee pain. Certain things seem to make patellofemoral syndrome more likely. They include:  Moving out of alignment. The kneecap is supposed to move in a straight line when the thigh muscle pulls on it. Sometimes the kneecap moves in poor alignment. That can make the knee swell and hurt. Some experts believe it also wears down the cartilage.  Injury to the kneecap.  Strain on the knee. This may occur during sports activity. Soccer, running, skiing and cycling can put excess stress on the knee.  Being flat-footed or knock-kneed. SYMPTOMS   Knee pain.  Pain under the kneecap. This is usually a dull, aching pain.  Pain in the knee when doing certain things: squatting, kneeling, going up or down stairs.  Pain in the knee when you stand up after sitting down  for awhile.  Tightness in the knee.  Loss of muscle strength in the thigh.  Swelling of the knee. DIAGNOSIS  Healthcare providers often send people with knee pain to an orthopedic caregiver. This person has special training to treat problems with bones and joints. To decide what is causing your knee pain, your caregiver will probably:  Do a physical exam. This will probably include:  Asking about symptoms you have noticed.  Asking about your activities and any injuries.  Feeling your knee. Moving it. This will help test the knee's strength. It will also check alignment (whether the knee and leg are aligned normally).  Order some tests, such as:  Imaging tests. They create pictures of the inside of the knee. Tests may include:  X-rays.  Computed tomography (CT) scan. This uses X-rays and a computer to show more detail.  Magnetic resonance imaging (MRI). This test uses magnets, radio waves and a computer to make pictures. TREATMENT   Medication is almost always used first. It can relieve pain. It also can reduce swelling. Non-steroidal anti-inflammatory medicines (called NSAIDs) are usually suggested. Sometimes a stronger form is needed. A stronger form would require a prescription.  Other treatment may be needed after the swelling goes down. Possibilities include:  Exercise. Certain exercises can make the muscles around the knee stronger which decreases the pressure on the knee cap. This includes the thigh muscle. Certain exercises also may be suggested to increase your flexibility.  A knee brace. This gives the knee extra support   and helps align the movement of the knee cap.  Orthotics. These are special shoe inserts. They can help keep your leg and knee aligned.  Surgery is sometimes needed. This is rare. Options include:  Arthroscopy. The surgeon uses a special tool to remove any damaged pieces of the kneecap. Only a few small incisions (cuts) are needed.  Realignment.  This is open surgery. The goals are to reduce pressure and fix the way the kneecap moves. HOME CARE INSTRUCTIONS   Take any medication prescribed by your healthcare provider. Follow the directions carefully.  If your knee is swollen:  Put ice or cold packs on it. Do this for 20 to 30 minutes, 3 to 4 times a day.  Keep the knee raised. Make sure it is supported. Put a pillow under it.  Rest your knee. For example, take the elevator instead of the stairs for awhile. Or, take a break from sports activity that strain your knee. Try walking or swimming instead.  Whenever you are active:  Use an elastic bandage on your knee. This gives it support.  After any activity, put ice or cold packs on your knees. Do this for about 10 to 20 minutes.  Make sure you wear shoes that give good support. Make sure they are not worn down. The heels should not slant in or out. SEEK MEDICAL CARE IF:   Knee pain gets worse. Or it does not go away, even after taking pain medicine.  Swelling does not go down.  Your thigh muscle becomes weak.  You have an oral temperature above 102 F (38.9 C). SEEK IMMEDIATE MEDICAL CARE IF:  You have an oral temperature above 102 F (38.9 C), not controlled by medicine. Document Released: 08/24/2009 Document Revised: 11/28/2011 Document Reviewed: 11/25/2013 Exit[employer] Patient Information 2015 [employer]. This information is not intended to replace advice given to you by your health care provider. Make sure you discuss any questions you have with your health care provider.  

## 2015-07-09 ENCOUNTER — Ambulatory Visit: Payer: Self-pay | Admitting: Cardiovascular Disease

## 2015-07-21 ENCOUNTER — Encounter: Payer: Medicare Other | Attending: Physical Medicine & Rehabilitation

## 2015-07-21 ENCOUNTER — Ambulatory Visit: Payer: Medicare Other | Admitting: Physical Medicine & Rehabilitation

## 2015-07-21 DIAGNOSIS — M5416 Radiculopathy, lumbar region: Secondary | ICD-10-CM | POA: Insufficient documentation

## 2015-07-21 DIAGNOSIS — M961 Postlaminectomy syndrome, not elsewhere classified: Secondary | ICD-10-CM | POA: Insufficient documentation

## 2015-07-27 ENCOUNTER — Other Ambulatory Visit: Payer: Self-pay | Admitting: Physician Assistant

## 2015-07-27 NOTE — Telephone Encounter (Signed)
Request for refill on: Sertraline 50 mg Last Rx: 08.23.16, #30x0 Last OV: 07.29.16 Return: 1-2 Wks. Please advise on refills/SLS

## 2015-07-28 ENCOUNTER — Ambulatory Visit (INDEPENDENT_AMBULATORY_CARE_PROVIDER_SITE_OTHER): Payer: Medicare Other | Admitting: Cardiovascular Disease

## 2015-07-28 ENCOUNTER — Encounter: Payer: Self-pay | Admitting: Cardiovascular Disease

## 2015-07-28 VITALS — BP 126/80 | HR 88 | Ht 68.0 in | Wt 220.0 lb

## 2015-07-28 DIAGNOSIS — I2510 Atherosclerotic heart disease of native coronary artery without angina pectoris: Secondary | ICD-10-CM

## 2015-07-28 DIAGNOSIS — I4510 Unspecified right bundle-branch block: Secondary | ICD-10-CM

## 2015-07-28 NOTE — Progress Notes (Signed)
Patient ID: Cody Raymond, male   DOB: 1971/08/24, 44 y.o.   MRN: [id number]      Cardiology Office Note   Date:  07/30/2015   ID:  Cody Raymond, DOB [birth year], MRN [id number]  PCP:  Nance Pear., NP  Cardiologist:   Sanda Klein, MD   Chief Complaint  Patient presents with  . Annual Exam  . Shortness of Breath  . Chest Pain  . Dizziness      History of Present Illness: Cody Raymond is a 44 y.o. 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 He has a history of congenital heart disease, undergoing patent ductus arteriosus repair at age 72. Because of complaints of chest pain he underwent coronary angiography in 2009 and again in 2011 with minimal evidence of coronary atherosclerosis (studies were performed due to "false positive" nuclear stress test).  Since his last appointment he has not had syncope or near syncope. However he says that he is "always dizzy". He denies palpitations. He has intermittent episodes of nonexertional precordial chest discomfort, which she points out with the tip of his finger and describes a sharp. He is on disability for back problems.  When he was seen in consultation by Dr. Caryl Comes a cardiac MRI was recommended to help distinguish whether or not he truly has hypertrophic cardiomyopathy. He has claustrophobia and has not had the study performed. His mother has previously undergone septal myectomy for HOCM. He had a syncopal event about 4 years ago, but an implanted loop recorder did not show any arrhythmia and he did not have any further syncope since then. The loop recorder has since reached end of service and was explanted last year.  He has a history of congenital heart disease, undergoing patent ductus arteriosus repair at age 44. Because of complaints of chest pain he underwent coronary angiography in 2009 and again in 2011  with minimal evidence of coronary atherosclerosis (studies were performed due to "false positive" nuclear stress test).   Past Medical History  Diagnosis Date  . Diabetes mellitus   . Asthma   . Enlarged prostate   . Hyperlipidemia   . Heart disease   . Hypertension   . Blood transfusion   . GERD (gastroesophageal reflux disease)   . Refusal of blood transfusions as patient is Jehovah's Witness   . Neuromuscular disorder (HCC)     DJD  . Coronary artery disease   . CAD (coronary artery disease), non obstructive on cath 2011 04/05/2012  . H/O syncope     Past Surgical History  Procedure Laterality Date  . Appendectomy    . Patent ductus arterious repair      at age 21  . Colonoscopy w/ polypectomy  03/17/11    diminutive polyp  . Cardiac catheterization  2007    Clean Cardiac Cath [facility] Cards), with RCA 30% narrowing likely due to catheter induced spasm in 09/02/10.  . Back surgery    . Cardiac catheterization  09/02/2010    mod. nonobstructive disease in the RCA and CX, tortuous LAD  . Nm myocar perf wall motion  01/25/2008    mild anteroapical wall ischemia  . Loop recorder implant  04/06/2012    Reveal XT 4529  . Loop recorder implant N/A 04/06/2012    Procedure: LOOP RECORDER IMPLANT;  Surgeon: Sanda Klein, MD;  Location: [place] [facility];  Service: Cardiovascular;  Laterality: N/A;  . Loop recorder explant N/A  01/14/2014    Procedure: LOOP RECORDER EXPLANT;  Surgeon: Sanda Klein, MD;  Location: [facility];  Service: Cardiovascular;  Laterality: N/A;     Current Outpatient Prescriptions  Medication Sig Dispense Refill  . albuterol (PROVENTIL) (2.5 MG/3ML) 0.083% nebulizer solution USE 1 VIAL VIA NEBULIZER  EVERY 6 HOURS AS NEEDED FOR WHEEZING OR SHORTNESS OF  BREATH 600 mL 0  . aspirin 325 MG tablet Take 325 mg by mouth daily.    . budesonide-formoterol (SYMBICORT) 160-4.5 MCG/ACT inhaler Inhale 2 puffs into the lungs 2 (two) times daily. 3 Inhaler 1  .  cetirizine (ZYRTEC) 10 MG tablet Take 10 mg by mouth daily.    . ciprofloxacin (CIPRO) 500 MG tablet     . clonazePAM (KLONOPIN) 1 MG tablet Take 1 tablet (1 mg total) by mouth daily. 30 tablet 0  . diazepam (VALIUM) 10 MG tablet Take 1 tablet (10 mg total) by mouth once. 1 tablet 0  . diclofenac sodium (VOLTAREN) 1 % GEL Apply 2 g topically 4 (four) times daily.    . fenofibrate (TRICOR) 145 MG tablet Take 1 tablet by mouth  daily 90 tablet 1  . fish oil-omega-3 fatty acids 1000 MG capsule Take 2 g by mouth daily.     . fluocinonide gel (LIDEX) 0.05 % Apply sparingly to scalp twice daily as needed 60 g 2  . gabapentin (NEURONTIN) 300 MG capsule Take 1 capsule by mouth at 6am, 11:30am, 4:30pm and take 3 capsules at 10pm. 540 capsule 1  . ketoconazole (NIZORAL) 2 % shampoo Apply 1 application  topically daily as directed 360 mL 1  . lansoprazole (PREVACID) 15 MG capsule Take 15 mg by mouth daily.    . meloxicam (MOBIC) 7.5 MG tablet Take 1 tablet (7.5 mg total) by mouth daily. 14 tablet 0  . metFORMIN (GLUCOPHAGE-XR) 500 MG 24 hr tablet Take 1 tablet by mouth  daily with breakfast 90 tablet 1  . methocarbamol (ROBAXIN) 500 MG tablet Take 1 tablet (500 mg total) by mouth at bedtime and may repeat dose one time if needed. 90 tablet 1  . montelukast (SINGULAIR) 10 MG tablet Take 1 tablet by mouth at  bedtime 90 tablet 1  . Multiple Vitamin (MULTIVITAMIN WITH MINERALS) TABS tablet Take 1 tablet by mouth daily.    . naproxen sodium (ALEVE) 220 MG tablet Take 440 mg by mouth 2 (two) times daily. Take 440 mg in the morning and 220 mg in the evening.    . nitroGLYCERIN (NITROSTAT) 0.4 MG SL tablet Place 1 tablet (0.4 mg total) under the tongue every 5 (five) minutes as needed for chest pain. 30 tablet 1  . ONETOUCH VERIO test strip Check blood sugar 3 times  daily 300 each 1  . ramelteon (ROZEREM) 8 MG tablet Take 8 mg by mouth at bedtime.    . rosuvastatin (CRESTOR) 40 MG tablet Take 1 tablet (40 mg  total) by mouth daily. 90 tablet 3  . sertraline (ZOLOFT) 50 MG tablet Take 1 tablet by mouth  daily 90 tablet 0  . tamsulosin (FLOMAX) 0.4 MG CAPS capsule Take 1 capsule by mouth  daily 90 capsule 1  . traZODone (DESYREL) 100 MG tablet Take 1 tablet by mouth at  bedtime 90 tablet 1  . zolpidem (AMBIEN) 10 MG tablet Take 1 tablet (10 mg total) by mouth at bedtime as needed for sleep. 30 tablet 0  . [DISCONTINUED] niacin (NIASPAN) 1000 MG CR tablet Take 1 tablet (1,000 mg total)  by mouth at bedtime. 30 tablet 6   No current facility-administered medications for this visit.    Allergies:   Benadryl and Red blood cells    Social History:  The patient  reports that he has never smoked. He has never used smokeless tobacco. He reports that he drinks alcohol. He reports that he does not use illicit drugs.   Family History:  The patient's family history includes Allergies in his daughter; Aneurysm in his father; Breast cancer in his maternal grandmother; Colon cancer in his paternal grandfather and paternal uncle; Colon polyps in his mother; Coronary artery disease in his father and mother; Heart attack (age of onset: 85) in his father; Heart disease in his mother; Liver disease in an other family member; Prostate cancer in his paternal grandfather; Stomach cancer in his brother.    ROS:  Please see the history of present illness.    Otherwise, review of systems positive for none.   All other systems are reviewed and negative.    PHYSICAL EXAM: VS:  BP 126/80 mmHg  Pulse 88  Ht 5\' 8"  (1.727 m)  Wt 220 lb (99.791 kg)  BMI 33.46 kg/m2 , BMI Body mass index is 33.46 kg/(m^2).  General: Alert, oriented x3, no distress Head: no evidence of trauma, PERRL, EOMI, no exophtalmos or lid lag, no myxedema, no xanthelasma; normal ears, nose and oropharynx Neck: normal jugular venous pulsations and no hepatojugular reflux; brisk carotid pulses without delay and no carotid bruits Chest: clear to  auscultation, no signs of consolidation by percussion or palpation, normal fremitus, symmetrical and full respiratory excursions Cardiovascular: normal position and quality of the apical impulse, regular rhythm, normal first and second heart sounds, no murmurs, rubs or gallops Abdomen: no tenderness or distention, no masses by palpation, no abnormal pulsatility or arterial bruits, normal bowel sounds, no hepatosplenomegaly Extremities: no clubbing, cyanosis or edema; 2+ radial, ulnar and brachial pulses bilaterally; 2+ right femoral, posterior tibial and dorsalis pedis pulses; 2+ left femoral, posterior tibial and dorsalis pedis pulses; no subclavian or femoral bruits Neurological: grossly nonfocal Psych: euthymic mood, full affect   EKG:  EKG is ordered today. The ekg ordered today demonstrates normal sinus rhythm, atypical right bundle branch block with subtle ST segment elevation primarily in lead V2. Not very different from previous tracing   Recent Labs: 10/13/2014: TSH 1.62 01/20/2015: ALT 46; B Natriuretic Peptide 4.6 04/17/2015: BUN 14; Creatinine, Ser 0.97; Hemoglobin 14.2; Platelets 269.0; Potassium 4.1; Sodium 139    Lipid Panel    Component Value Date/Time   CHOL 198 04/17/2015 0940   TRIG 182.0* 04/17/2015 0940   HDL 40.50 04/17/2015 0940   CHOLHDL 5 04/17/2015 0940   VLDL 36.4 04/17/2015 0940   LDLCALC 122* 04/17/2015 0940   LDLDIRECT 46.0 01/16/2015 0931      Wt Readings from Last 3 Encounters:  07/28/15 220 lb (99.791 kg)  05/01/15 214 lb 4 oz (97.183 kg)  04/17/15 217 lb 9.6 oz (98.703 kg)     ASSESSMENT AND PLAN:  1. Remote history of syncope, no recurrence in 4 years 2. Mild left ventricular hypertrophy by echo and family history of hypertrophic cardiomyopathy. Questionable personal history of genetic hypertrophic cardiomyopathy. No evidence of LV obstruction by echo 3. Abnormal ECG suggestive of Brugada syndrome 4. Minimal coronary atherosclerosis by invasive  angiography, performed for "false positive" nuclear stress test 5. Obesity 6. Mixed hyperlipidemia with good parameters on combined statin and fibrate therapy 7. Asthma 8. Prostate enlargement 9. Severe lumbar  spine disease 10. Jehovah's Witness refuses blood products   Current medicines are reviewed at length with the patient today.  The patient does not have concerns regarding medicines.  The following changes have been made:  no change  Try to see if there is an option anywhere for a "open" cardiac MRI, but I am not optimistic.  Labs/ tests ordered today include:  Orders Placed This Encounter  Procedures  . EKG 12-Lead   Patient Instructions  Dr. Sallyanne Kuster recommends that you schedule a follow-up appointment in: [place], Judah Chevere, MD  07/30/2015 6:50 PM    Sanda Klein, MD, [facility] [phone] office [phone] pager

## 2015-07-28 NOTE — Patient Instructions (Signed)
Dr. Croitoru recommends that you schedule a follow-up appointment in: 6 MONTHS   

## 2015-07-31 ENCOUNTER — Ambulatory Visit: Payer: Self-pay | Admitting: Family

## 2015-08-03 ENCOUNTER — Ambulatory Visit: Payer: Medicare Other | Admitting: Family

## 2015-08-03 ENCOUNTER — Telehealth: Payer: Self-pay | Admitting: Family

## 2015-08-03 DIAGNOSIS — Z0289 Encounter for other administrative examinations: Secondary | ICD-10-CM

## 2015-08-04 ENCOUNTER — Telehealth: Payer: Self-pay | Admitting: *Deleted

## 2015-08-04 NOTE — Telephone Encounter (Signed)
-----   Message from Sanda Klein, MD sent at 07/30/2015  6:59 PM EST ----- Please tell him that I have asked if there is an option for an open cardiac MRI and the answer unfortunately is no would he like me to try to set up a conventional cardiac MRI with sedation?

## 2015-08-04 NOTE — Telephone Encounter (Signed)
He is not sure he wants the cardiac MRI w/sedation.  Will think about it and call to let us know of his decision.

## 2015-08-05 NOTE — Telephone Encounter (Signed)
Yes please

## 2015-08-05 NOTE — Telephone Encounter (Signed)
Pt was no show 08/03/15 8:30am for follow up, pt did not reschedule, charge or no charge?

## 2015-08-09 ENCOUNTER — Other Ambulatory Visit: Payer: Self-pay | Admitting: Family

## 2015-08-21 ENCOUNTER — Telehealth: Payer: Self-pay | Admitting: Family

## 2015-08-21 ENCOUNTER — Other Ambulatory Visit: Payer: Self-pay | Admitting: Family

## 2015-08-21 NOTE — Telephone Encounter (Signed)
LVM for pt to call in to schedule flu vac

## 2015-08-24 LAB — HM DIABETES EYE EXAM

## 2015-09-01 ENCOUNTER — Other Ambulatory Visit: Payer: Self-pay | Admitting: Physician Assistant

## 2015-09-01 ENCOUNTER — Other Ambulatory Visit: Payer: Self-pay | Admitting: Family

## 2015-09-01 ENCOUNTER — Encounter: Payer: Self-pay | Admitting: Family

## 2015-09-01 NOTE — Telephone Encounter (Signed)
Will defer further refills of patient's medications to PCP  

## 2015-09-01 NOTE — Telephone Encounter (Signed)
Pt should still have refills on file from 05/20/15 Rx. Denial sent to [facility].

## 2015-09-02 ENCOUNTER — Telehealth: Payer: Self-pay

## 2015-09-02 NOTE — Telephone Encounter (Signed)
Received fax to refill Tramadol. It is not currently on med list. Is this okay to refill? Please advise. Thanks!

## 2015-09-02 NOTE — Telephone Encounter (Signed)
He was last seen in October and canceled injection appt in November.  No follow up scheduled.  How many refills and when should he be seen?

## 2015-09-02 NOTE — Telephone Encounter (Signed)
Ok to refill 

## 2015-09-03 MED ORDER — TRAMADOL HCL 50 MG PO TABS: 50.0000 mg | ORAL_TABLET | Freq: Four times a day (QID) | ORAL | Status: DC | PRN

## 2015-09-03 NOTE — Telephone Encounter (Signed)
RX phoned into [facility] home [facility]

## 2015-09-03 NOTE — Telephone Encounter (Signed)
3 refills 

## 2015-09-10 ENCOUNTER — Other Ambulatory Visit: Payer: Self-pay | Admitting: *Deleted

## 2015-09-10 NOTE — Telephone Encounter (Deleted)
Per Dr Letta Pate signed RX fax to [facility] for Tramadol, a new Rx is printed to fax to the pharmacy due to no DEA on Rx fax and they have requested we refax with DEA.

## 2015-09-30 ENCOUNTER — Encounter: Payer: Self-pay | Admitting: Family

## 2015-09-30 ENCOUNTER — Ambulatory Visit (INDEPENDENT_AMBULATORY_CARE_PROVIDER_SITE_OTHER): Payer: Medicare Other | Admitting: Family

## 2015-09-30 VITALS — BP 121/79 | HR 92 | Temp 98.2°F | Resp 18 | Ht 68.0 in | Wt 219.0 lb

## 2015-09-30 DIAGNOSIS — R109 Unspecified abdominal pain: Secondary | ICD-10-CM

## 2015-09-30 DIAGNOSIS — G8929 Other chronic pain: Secondary | ICD-10-CM | POA: Diagnosis not present

## 2015-09-30 DIAGNOSIS — E118 Type 2 diabetes mellitus with unspecified complications: Secondary | ICD-10-CM

## 2015-09-30 DIAGNOSIS — Z23 Encounter for immunization: Secondary | ICD-10-CM | POA: Diagnosis not present

## 2015-09-30 DIAGNOSIS — M549 Dorsalgia, unspecified: Secondary | ICD-10-CM | POA: Diagnosis not present

## 2015-09-30 LAB — POCT URINALYSIS DIPSTICK
BILIRUBIN UA: NEGATIVE
Blood, UA: NEGATIVE
GLUCOSE UA: NEGATIVE
Ketones, UA: NEGATIVE
LEUKOCYTES UA: NEGATIVE
NITRITE UA: NEGATIVE
PH UA: 6
Protein, UA: NEGATIVE
Spec Grav, UA: >=1.03
UROBILINOGEN UA: NEGATIVE

## 2015-09-30 LAB — BASIC METABOLIC PANEL
BUN: 16 mg/dL (ref 6–23)
CHLORIDE: 101 meq/L (ref 96–112)
CO2: 29 meq/L (ref 19–32)
CREATININE: 0.86 mg/dL (ref 0.40–1.50)
Calcium: 9.3 mg/dL (ref 8.4–10.5)
GFR: 102.26 mL/min (ref >60.00)
Glucose, Bld: 107 mg/dL — ABNORMAL HIGH (ref 70–99)
POTASSIUM: 3.9 meq/L (ref 3.5–5.1)
Sodium: 136 mEq/L (ref 135–145)

## 2015-09-30 LAB — HEMOGLOBIN A1C: Hgb A1c MFr Bld: 6.4 % (ref 4.6–6.5)

## 2015-09-30 MED ORDER — MELOXICAM 7.5 MG PO TABS: 7.5000 mg | ORAL_TABLET | Freq: Every day | ORAL | Status: DC

## 2015-09-30 NOTE — Progress Notes (Signed)
Subjective:    Patient ID: Cody Raymond, male    DOB: November 01, 1970, 45 y.o.   MRN: [id number]  HPI  Cody Raymond is  45 yr old male who presents today for follow up.   DM2- Reports sugars as high as 310. Was taking metformin once a day and not taking twice a day. Sugars are now looking some better.  Lab Results  Component Value Date   HGBA1C 5.8 04/17/2015   Low back pain-  Reports that he has chronic low back pain.  Now having pain up a bit higher. Started on the left side now as well.  Taking a deep breath can make it feel worse.     Review of Systems See HPI  Past Medical History  Diagnosis Date  . Diabetes mellitus   . Asthma   . Enlarged prostate   . Hyperlipidemia   . Heart disease   . Hypertension   . Blood transfusion   . GERD (gastroesophageal reflux disease)   . Refusal of blood transfusions as patient is Jehovah's Witness   . Neuromuscular disorder (HCC)     DJD  . Coronary artery disease   . CAD (coronary artery disease), non obstructive on cath 2011 04/05/2012  . H/O syncope     Social History   Social History  . Marital Status: Married    Spouse Name: N/A  . Number of Children: N/A  . Years of Education: N/A   Occupational History  . Not on file.   Social History Main Topics  . Smoking status: Never Smoker   . Smokeless tobacco: Never Used  . Alcohol Use: Yes     Comment: rare  . Drug Use: No  . Sexual Activity: Not on file   Other Topics Concern  . Not on file   Social History Narrative   Holter monitor 08/2010: PVCs and sinus tachy.   Sleep Study (02/2008): mild sleep apnea, no indication for CPAP.    Past Surgical History  Procedure Laterality Date  . Appendectomy    . Patent ductus arterious repair      at age 32  . Colonoscopy w/ polypectomy  03/17/11    diminutive polyp  . Cardiac catheterization  2007    Clean Cardiac Cath [facility] Cards), with RCA 30% narrowing likely due to catheter induced spasm in 09/02/10.  . Back  surgery    . Cardiac catheterization  09/02/2010    mod. nonobstructive disease in the RCA and CX, tortuous LAD  . Nm myocar perf wall motion  01/25/2008    mild anteroapical wall ischemia  . Loop recorder implant  04/06/2012    Reveal XT 4529  . Loop recorder implant N/A 04/06/2012    Procedure: LOOP RECORDER IMPLANT;  Surgeon: Sanda Klein, MD;  Location: [facility] CATH LAB;  Service: Cardiovascular;  Laterality: N/A;  . Loop recorder explant N/A 01/14/2014    Procedure: LOOP RECORDER EXPLANT;  Surgeon: Sanda Klein, MD;  Location: [place] CATH LAB;  Service: Cardiovascular;  Laterality: N/A;    Family History  Problem Relation Age of Onset  . Colon cancer Paternal Grandfather   . Prostate cancer Paternal Grandfather   . Aneurysm Father   . Heart attack Father 41  . Coronary artery disease Father   . Colon cancer Paternal Uncle     x 2  . Colon polyps Mother   . Heart disease Mother   . Coronary artery disease Mother   . Breast cancer Maternal Grandmother   .  Stomach cancer Brother   . Liver disease      unsure who it was  . Allergies Daughter     Allergies  Allergen Reactions  . Benadryl [Diphenhydramine Hcl]     "drives me nuts"  . Red Blood Cells     PT. REFUSES ANY BLOOD PRODUCTS - JEHOVAH'S WITNESS.    Current Outpatient Prescriptions on File Prior to Visit  Medication Sig Dispense Refill  . albuterol (PROVENTIL) (2.5 MG/3ML) 0.083% nebulizer solution USE 1 VIAL VIA NEBULIZER  EVERY 6 HOURS AS NEEDED FOR WHEEZING OR SHORTNESS OF  BREATH 600 mL 1  . aspirin 325 MG tablet Take 325 mg by mouth daily.    . budesonide-formoterol (SYMBICORT) 160-4.5 MCG/ACT inhaler Inhale 2 puffs into the lungs 2 (two) times daily. 3 Inhaler 1  . cetirizine (ZYRTEC) 10 MG tablet Take 10 mg by mouth daily.    . ciprofloxacin (CIPRO) 500 MG tablet     . clonazePAM (KLONOPIN) 1 MG tablet Take 1 tablet (1 mg total) by mouth daily. 30 tablet 0  . diazepam (VALIUM) 10 MG tablet Take 1 tablet (10 mg  total) by mouth once. 1 tablet 0  . diclofenac sodium (VOLTAREN) 1 % GEL Apply 2 g topically 4 (four) times daily.    . fenofibrate (TRICOR) 145 MG tablet Take 1 tablet by mouth  daily 90 tablet 1  . fish oil-omega-3 fatty acids 1000 MG capsule Take 2 g by mouth daily.     . fluocinonide gel (LIDEX) 0.05 % Apply sparingly to scalp twice daily as needed 60 g 2  . gabapentin (NEURONTIN) 300 MG capsule TAKE 1 CAPSULE BY MOUTH AT  6AM, 11:30AM, 4:30PM AND  TAKE 3 CAPSULES AT 10PM. 540 capsule 1  . ketoconazole (NIZORAL) 2 % shampoo Apply 1 application  topically daily as directed 360 mL 1  . lansoprazole (PREVACID) 15 MG capsule Take 15 mg by mouth daily.    Marland Kitchen lisinopril (PRINIVIL,ZESTRIL) 2.5 MG tablet Take 1 tablet by mouth  daily 90 tablet 1  . meloxicam (MOBIC) 7.5 MG tablet Take 1 tablet (7.5 mg total) by mouth daily. 14 tablet 0  . metFORMIN (GLUCOPHAGE-XR) 500 MG 24 hr tablet Take 1 tablet by mouth  daily with breakfast 90 tablet 1  . methocarbamol (ROBAXIN) 500 MG tablet Take 1 tablet (500 mg total) by mouth at bedtime and may repeat dose one time if needed. 90 tablet 1  . montelukast (SINGULAIR) 10 MG tablet Take 1 tablet by mouth at  bedtime 90 tablet 1  . Multiple Vitamin (MULTIVITAMIN WITH MINERALS) TABS tablet Take 1 tablet by mouth daily.    . naproxen sodium (ALEVE) 220 MG tablet Take 440 mg by mouth 2 (two) times daily. Take 440 mg in the morning and 220 mg in the evening.    . nitroGLYCERIN (NITROSTAT) 0.4 MG SL tablet Place 1 tablet (0.4 mg total) under the tongue every 5 (five) minutes as needed for chest pain. 30 tablet 1  . ONETOUCH VERIO test strip Check blood sugar 3 times  daily 300 each 1  . ramelteon (ROZEREM) 8 MG tablet Take 8 mg by mouth at bedtime.    . rosuvastatin (CRESTOR) 40 MG tablet Take 1 tablet (40 mg total) by mouth daily. 90 tablet 3  . sertraline (ZOLOFT) 50 MG tablet Take 1 tablet by mouth  daily 90 tablet 0  . tamsulosin (FLOMAX) 0.4 MG CAPS capsule Take 1  capsule by mouth  daily 90 capsule 1  .  traMADol (ULTRAM) 50 MG tablet Take 1 tablet (50 mg total) by mouth every 6 (six) hours as needed. 360 tablet 0  . traZODone (DESYREL) 100 MG tablet Take 1 tablet by mouth at  bedtime 90 tablet 1  . zolpidem (AMBIEN) 10 MG tablet Take 1 tablet (10 mg total) by mouth at bedtime as needed for sleep. 30 tablet 0  . [DISCONTINUED] niacin (NIASPAN) 1000 MG CR tablet Take 1 tablet (1,000 mg total) by mouth at bedtime. 30 tablet 6   No current facility-administered medications on file prior to visit.    BP 121/79 mmHg  Pulse 92  Temp(Src) 98.2 F (36.8 C) (Oral)  Resp 18  Ht 5\' 8"  (1.727 m)  Wt 219 lb (99.338 kg)  BMI 33.31 kg/m2  SpO2 99%       Objective:   Physical Exam  Constitutional: He is oriented to person, place, and time. He appears well-developed and well-nourished. No distress.  HENT:  Head: Normocephalic and atraumatic.  Cardiovascular: Normal rate and regular rhythm.   No murmur heard. Pulmonary/Chest: Effort normal and breath sounds normal. No respiratory distress. He has no wheezes. He has no rales.  Genitourinary:  Neg CVAT bilaterally  Musculoskeletal: He exhibits no edema.       Thoracic back: He exhibits tenderness.       Lumbar back: He exhibits tenderness.  Neurological: He is alert and oriented to person, place, and time.  Skin: Skin is warm and dry.  Psychiatric: He has a normal mood and affect. His behavior is normal. Thought content normal.          Assessment & Plan:

## 2015-09-30 NOTE — Assessment & Plan Note (Signed)
DM is better now that he is taking metfomin bid. Continue current dose, obtain A1C.

## 2015-09-30 NOTE — Assessment & Plan Note (Signed)
Suspect that low back pain is musculoskeletal in nature. UA unremarkable neg cvat. Will send Urine for culture, thought doubt infection. Advised trial of meloxicam.

## 2015-09-30 NOTE — Patient Instructions (Signed)
Please complete lab work prior to leaving.  Start meloxicam once daily for back pain. Call if back pain worsens or if back pain does not improve.

## 2015-09-30 NOTE — Progress Notes (Signed)
Pre visit review using our clinic review tool, if applicable. No additional management support is needed unless otherwise documented below in the visit note. 

## 2015-10-01 LAB — URINE CULTURE
Colony Count: NO GROWTH
Organism ID, Bacteria: NO GROWTH

## 2015-10-03 ENCOUNTER — Other Ambulatory Visit: Payer: Self-pay | Admitting: Internal Medicine

## 2015-10-03 ENCOUNTER — Other Ambulatory Visit: Payer: Self-pay | Admitting: Family

## 2015-10-03 ENCOUNTER — Other Ambulatory Visit: Payer: Self-pay | Admitting: Physical Medicine & Rehabilitation

## 2015-10-03 ENCOUNTER — Encounter: Payer: Self-pay | Admitting: Family

## 2015-10-05 ENCOUNTER — Ambulatory Visit: Payer: Self-pay | Admitting: Physical Medicine & Rehabilitation

## 2015-10-06 MED ORDER — SERTRALINE HCL 50 MG PO TABS: 50.0000 mg | ORAL_TABLET | Freq: Every day | ORAL | Status: DC

## 2015-10-06 MED ORDER — ZOLPIDEM TARTRATE 10 MG PO TABS: 10.0000 mg | ORAL_TABLET | Freq: Every evening | ORAL | Status: DC | PRN

## 2015-10-06 MED ORDER — MELOXICAM 7.5 MG PO TABS: 7.5000 mg | ORAL_TABLET | Freq: Every day | ORAL | Status: DC

## 2015-10-06 MED ORDER — CLONAZEPAM 1 MG PO TABS: 1.0000 mg | ORAL_TABLET | Freq: Every day | ORAL | Status: DC

## 2015-10-06 NOTE — Telephone Encounter (Signed)
Rxs faxed to [facility]. Message sent to pt.

## 2015-10-06 NOTE — Telephone Encounter (Signed)
Rxs signed by PCP and faxed to E

## 2015-10-06 NOTE — Telephone Encounter (Signed)
Attempted to send Sertraline refills and received message that concurrent use with Tramadol may increase chance of serotonin syndrome.  Please advise?  Please advise re:  Zolpidem, clonazepam, meloxicam and Rozerem (prescribed by Dr Elizebeth Koller)?

## 2015-10-07 ENCOUNTER — Telehealth: Payer: Self-pay | Admitting: Family

## 2015-10-07 ENCOUNTER — Ambulatory Visit (INDEPENDENT_AMBULATORY_CARE_PROVIDER_SITE_OTHER): Payer: Medicare Other | Admitting: Family

## 2015-10-07 DIAGNOSIS — Z5181 Encounter for therapeutic drug level monitoring: Secondary | ICD-10-CM

## 2015-10-07 NOTE — Progress Notes (Signed)
   Subjective:    Patient ID: Cody Raymond, male    DOB: 07/27/1971, 45 y.o.   MRN: [id number]  HPI    Review of Systems     Objective:   Physical Exam        Assessment & Plan:  EKG tracing is personally reviewed.  EKG notes NSR.  RBBB- not new. No acute changes.

## 2015-10-07 NOTE — Telephone Encounter (Signed)
Could you please let pt know that we sent refills in for him however there is a slight risk of drug interaction between his zoloft and his tramadol.  Due to his dosing, this risk is low. Since he is doing well on both of these drugs I think that the benefit outweighs risk.  I would like to book a nurse visit at his convenience to check a baseline EKG please.

## 2015-10-07 NOTE — Telephone Encounter (Signed)
Notified pt and he voices understanding.  He will come in today at 2:45pm for EKG.

## 2015-10-07 NOTE — Progress Notes (Signed)
Pre visit review using our clinic review tool, if applicable. No additional management support is needed unless otherwise documented below in the visit note.  Patient presents in office today for EKG per telephone note on 10/07/15. Patient tolerated procedure well. Printout of patient's findings given to the provider for review.   Per O[place]ullivan: No changes from prior EKG in the past. Patient informed; no questions or concerns before leaving nurse visit.

## 2015-10-09 ENCOUNTER — Other Ambulatory Visit: Payer: Self-pay | Admitting: Physician Assistant

## 2015-10-09 NOTE — Telephone Encounter (Signed)
Will defer further refills of patient's medications to PCP  

## 2015-10-09 NOTE — Telephone Encounter (Signed)
Refills sent

## 2015-10-14 NOTE — Telephone Encounter (Signed)
Melissa--- please advise re: pt's rozerem request?

## 2015-10-15 MED ORDER — RAMELTEON 8 MG PO TABS: 8.0000 mg | ORAL_TABLET | Freq: Every day | ORAL | Status: DC

## 2015-10-15 NOTE — Telephone Encounter (Signed)
rx sent to [facility] 

## 2015-10-19 ENCOUNTER — Encounter: Payer: Medicare Other | Attending: Physical Medicine & Rehabilitation

## 2015-10-19 ENCOUNTER — Encounter: Payer: Self-pay | Admitting: Physical Medicine & Rehabilitation

## 2015-10-19 ENCOUNTER — Ambulatory Visit (HOSPITAL_BASED_OUTPATIENT_CLINIC_OR_DEPARTMENT_OTHER): Payer: Medicare Other | Admitting: Physical Medicine & Rehabilitation

## 2015-10-19 VITALS — BP 124/69 | HR 93 | Resp 14

## 2015-10-19 DIAGNOSIS — M5416 Radiculopathy, lumbar region: Secondary | ICD-10-CM | POA: Diagnosis not present

## 2015-10-19 DIAGNOSIS — M47816 Spondylosis without myelopathy or radiculopathy, lumbar region: Secondary | ICD-10-CM

## 2015-10-19 DIAGNOSIS — M961 Postlaminectomy syndrome, not elsewhere classified: Secondary | ICD-10-CM | POA: Insufficient documentation

## 2015-10-19 MED ORDER — TIZANIDINE HCL 2 MG PO TABS: 2.0000 mg | ORAL_TABLET | Freq: Four times a day (QID) | ORAL | Status: DC | PRN

## 2015-10-19 NOTE — Patient Instructions (Signed)

## 2015-10-19 NOTE — Progress Notes (Signed)
Subjective:    Patient ID: Cody Raymond, male    DOB: 04-27-1971, 45 y.o.   MRN: [id number] 06/03/2011 Decompressive laminectomy as well as microdiscectomy and foraminotomies right L4-L5. Had preoperative ankle dorsiflexor and toe extensor weakness. HPI Patient with history of chronic right L4 radiculopathy however this is not his primary complaint today.  Over the last couple months she's had increasing back pain. He has not done his workout over the last 3-4 months. This started out because she had some exacerbation of pain. He goes to a gym but did not ever receive a structured workout from PT.  Patient remains on tramadol 1 tablet every 6 hours. We discussed that he is on several medicines that may rate his serotonin level, namely Zoloft, trazodone, tramadol.  He is currently not exhibiting any signs of serotonin syndrome but I would not feel comfortable raising the dose of his tramadol  Prescribed Robaxin last visit however he states that he did not feel this because of cost. He has not tried tizanidine in the past.  He does have some left flank pain as well discussed was checked out by his PCP, not felt to be due to kidney disease. Pain Inventory Average Pain 8 Pain Right Now 7 My pain is constant, sharp, burning, dull, stabbing, tingling and aching  In the last 24 hours, has pain interfered with the following? General activity 5 Relation with others 5 Enjoyment of life 5 What TIME of day is your pain at its worst? morning, daytime, evening, night Sleep (in general) Fair  Pain is worse with: walking, bending, sitting and standing Pain improves with: medication Relief from Meds: 4  Mobility use a cane how many minutes can you walk? 20 ability to climb steps?  no do you drive?  yes Do you have any goals in this area?  no  Function disabled: date disabled NA I need assistance with the following:  dressing and bathing Do you have any goals in this area?   yes  Neuro/Psych weakness numbness tremor tingling spasms dizziness confusion depression anxiety  Prior Studies Any changes since last visit?  no bone scan x-rays CT/MRI nerve study  Physicians involved in your care Any changes since last visit?  no Primary care . Neurologist . Psychiatrist . Psychologist .   Family History  Problem Relation Age of Onset  . Colon cancer Paternal Grandfather   . Prostate cancer Paternal Grandfather   . Aneurysm Father   . Heart attack Father 78  . Coronary artery disease Father   . Colon cancer Paternal Uncle     x 2  . Colon polyps Mother   . Heart disease Mother   . Coronary artery disease Mother   . Breast cancer Maternal Grandmother   . Stomach cancer Brother   . Liver disease      unsure who it was  . Allergies Daughter    Social History   Social History  . Marital Status: Married    Spouse Name: N/A  . Number of Children: N/A  . Years of Education: N/A   Social History Main Topics  . Smoking status: Never Smoker   . Smokeless tobacco: Never Used  . Alcohol Use: Yes     Comment: rare  . Drug Use: No  . Sexual Activity: Not Asked   Other Topics Concern  . None   Social History Narrative   Holter monitor 08/2010: PVCs and sinus tachy.   Sleep Study (02/2008): mild sleep apnea, no  indication for CPAP.   Past Surgical History  Procedure Laterality Date  . Appendectomy    . Patent ductus arterious repair      at age 28  . Colonoscopy w/ polypectomy  03/17/11    diminutive polyp  . Cardiac catheterization  2007    Clean Cardiac Cath [facility] Cards), with RCA 30% narrowing likely due to catheter induced spasm in 09/02/10.  . Back surgery    . Cardiac catheterization  09/02/2010    mod. nonobstructive disease in the RCA and CX, tortuous LAD  . Nm myocar perf wall motion  01/25/2008    mild anteroapical wall ischemia  . Loop recorder implant  04/06/2012    Reveal XT 4529  . Loop recorder implant N/A  04/06/2012    Procedure: LOOP RECORDER IMPLANT;  Surgeon: Sanda Klein, MD;  Location: [place] CATH LAB;  Service: Cardiovascular;  Laterality: N/A;  . Loop recorder explant N/A 01/14/2014    Procedure: LOOP RECORDER EXPLANT;  Surgeon: Sanda Klein, MD;  Location: [place] CATH LAB;  Service: Cardiovascular;  Laterality: N/A;   Past Medical History  Diagnosis Date  . Diabetes mellitus   . Asthma   . Enlarged prostate   . Hyperlipidemia   . Heart disease   . Hypertension   . Blood transfusion   . GERD (gastroesophageal reflux disease)   . Refusal of blood transfusions as patient is Jehovah's Witness   . Neuromuscular disorder (HCC)     DJD  . Coronary artery disease   . CAD (coronary artery disease), non obstructive on cath 2011 04/05/2012  . H/O syncope    BP 124/69 mmHg  Pulse 93  Resp 14  SpO2 97%  Opioid Risk Score:   Fall Risk Score:  `1  Depression screen PHQ 2/9  Depression screen PHQ 2/9 12/25/2014  Decreased Interest 2  Down, Depressed, Hopeless 1  PHQ - 2 Score 3  Altered sleeping 1  Tired, decreased energy 1  Change in appetite 0  Feeling bad or failure about yourself  2  Trouble concentrating 2  Moving slowly or fidgety/restless 2  Suicidal thoughts 0  PHQ-9 Score 11     Review of Systems  Constitutional: Positive for unexpected weight change.  Respiratory: Positive for cough and shortness of breath.   Gastrointestinal: Positive for nausea, vomiting, abdominal pain, diarrhea and constipation.  Endocrine:       High blood sugar  Neurological: Positive for dizziness, tremors, weakness and numbness.       Tingling spasms  Psychiatric/Behavioral: Positive for confusion and dysphoric mood. The patient is nervous/anxious.   All other systems reviewed and are negative.      Objective:   Physical Exam  Constitutional: He is oriented to person, place, and time. He appears well-developed and well-nourished.  HENT:  Head: Normocephalic and atraumatic.  Eyes:  Conjunctivae and EOM are normal. Pupils are equal, round, and reactive to light.  Neck: Normal range of motion.  Musculoskeletal:       Lumbar back: He exhibits decreased range of motion. He exhibits no deformity.  Pain with lumbar extension greater than with lumbar flexion  Neurological: He is alert and oriented to person, place, and time. He has normal strength. He displays no atrophy.  Psychiatric: He has a normal mood and affect.  Nursing note and vitals reviewed.         Assessment & Plan:   1. Chronic lumbar pain with physical findings more in line with lumbar facet arthropathy as a  potential etiology.  I reviewed x-rays from March 2016. There is evidence of some lumbar facet arthropathy noted at L5-S1 mainly on the right side. Recommend medial branch blocks on the right side L3-L4 as well as L5 dorsal ramus. I printed out some exercises he can do at home. I did recommend he not perform hyperextension exercises.  We will have him evaluated by PT so they can structure a community based exercise program, he would continue this at the gym  Will trial tizanidine 2 mg 3 times a day when necessary for muscle spasms. I believe his left-sided flank pain is most likely muscular in etiology  2. Chronic radiculopathy continue gabapentin, he had short-term relief with right L4 transforaminal injection  Over half of the 25 min visit was spent counseling and coordinating care.

## 2015-10-21 ENCOUNTER — Encounter: Payer: Self-pay | Admitting: Cardiovascular Disease

## 2015-10-21 ENCOUNTER — Encounter: Payer: Self-pay | Admitting: Family

## 2015-10-27 ENCOUNTER — Ambulatory Visit: Payer: Medicare Other | Admitting: Physical Therapy

## 2015-11-05 ENCOUNTER — Encounter: Payer: Self-pay | Admitting: Family

## 2015-11-10 ENCOUNTER — Telehealth: Payer: Self-pay | Admitting: *Deleted

## 2015-11-10 NOTE — Telephone Encounter (Signed)
Unable to reach patient at time of pre-visit call. Left message for patient to return call when available.  

## 2015-11-11 ENCOUNTER — Ambulatory Visit (INDEPENDENT_AMBULATORY_CARE_PROVIDER_SITE_OTHER): Payer: Medicare Other | Admitting: Family

## 2015-11-11 ENCOUNTER — Encounter: Payer: Self-pay | Admitting: Family

## 2015-11-11 ENCOUNTER — Telehealth: Payer: Self-pay | Admitting: *Deleted

## 2015-11-11 VITALS — BP 122/79 | HR 88 | Temp 98.8°F | Resp 16 | Ht 68.0 in | Wt 221.0 lb

## 2015-11-11 DIAGNOSIS — Z0000 Encounter for general adult medical examination without abnormal findings: Secondary | ICD-10-CM | POA: Insufficient documentation

## 2015-11-11 DIAGNOSIS — E785 Hyperlipidemia, unspecified: Secondary | ICD-10-CM

## 2015-11-11 DIAGNOSIS — H6691 Otitis media, unspecified, right ear: Secondary | ICD-10-CM

## 2015-11-11 DIAGNOSIS — E1140 Type 2 diabetes mellitus with diabetic neuropathy, unspecified: Secondary | ICD-10-CM

## 2015-11-11 DIAGNOSIS — J4541 Moderate persistent asthma with (acute) exacerbation: Secondary | ICD-10-CM

## 2015-11-11 DIAGNOSIS — J019 Acute sinusitis, unspecified: Secondary | ICD-10-CM

## 2015-11-11 LAB — HEPATIC FUNCTION PANEL
ALT: 64 U/L — ABNORMAL HIGH (ref 0–53)
AST: 49 U/L — ABNORMAL HIGH (ref 0–37)
Albumin: 4.2 g/dL (ref 3.5–5.2)
Alkaline Phosphatase: 113 U/L (ref 39–117)
BILIRUBIN DIRECT: 0.1 mg/dL (ref 0.0–0.3)
BILIRUBIN TOTAL: 0.7 mg/dL (ref 0.2–1.2)
TOTAL PROTEIN: 7.4 g/dL (ref 6.0–8.3)

## 2015-11-11 LAB — LIPID PANEL
Cholesterol: 191 mg/dL (ref 0–200)
HDL: 33.7 mg/dL — AB (ref >39.00)
Total CHOL/HDL Ratio: 6

## 2015-11-11 LAB — LDL CHOLESTEROL, DIRECT: Direct LDL: 55 mg/dL

## 2015-11-11 MED ORDER — FLUTICASONE-SALMETEROL 100-50 MCG/DOSE IN AEPB: 1.0000 | INHALATION_SPRAY | Freq: Two times a day (BID) | RESPIRATORY_TRACT | Status: DC

## 2015-11-11 MED ORDER — NITROGLYCERIN 0.4 MG SL SUBL: 0.4000 mg | SUBLINGUAL_TABLET | SUBLINGUAL | Status: DC | PRN

## 2015-11-11 MED ORDER — METFORMIN HCL ER 500 MG PO TB24: ORAL_TABLET | ORAL | Status: DC

## 2015-11-11 MED ORDER — ZOLPIDEM TARTRATE 10 MG PO TABS: 10.0000 mg | ORAL_TABLET | Freq: Every evening | ORAL | Status: DC | PRN

## 2015-11-11 MED ORDER — AMOXICILLIN-POT CLAVULANATE 875-125 MG PO TABS: 1.0000 | ORAL_TABLET | Freq: Two times a day (BID) | ORAL | Status: DC

## 2015-11-11 NOTE — Telephone Encounter (Signed)
[name] Rxs for zolpidem and NTG printed at today's visit and was faxed to [facility]

## 2015-11-11 NOTE — Progress Notes (Signed)
Subjective:    Patient ID: Cody Raymond, male    DOB: 1971/04/02, 45 y.o.   MRN: [id number]  HPI  Patient presents today for complete physical.  Immunizations: Tetanus up to date Diet: could be better Wt Readings from Last 3 Encounters:  11/11/15 221 lb (100.245 kg)  09/30/15 219 lb (99.338 kg)  07/28/15 220 lb (99.791 kg)  Exercise: goes 3 times a week to the gym  Also reports + facial pressure, productive cough/yellow sputum.using afrin, mucinex, feels like this has helped some.  Denies known fever. Using albuterol nebs for cough which is helping  DM2- Pt is taking metformin bid.  Lab Results  Component Value Date   HGBA1C 6.4 09/30/2015   HGBA1C 5.8 04/17/2015   HGBA1C 6.4 01/16/2015   Lab Results  Component Value Date   MICROALBUR 1.15 11/20/2013   LDLCALC 122* 04/17/2015   CREATININE 0.86 09/30/2015   Hyperlipidemia- pt report fenofibrate is too expensive.  He is maintained on crestor.  Lab Results  Component Value Date   CHOL 198 04/17/2015   HDL 40.50 04/17/2015   LDLCALC 122* 04/17/2015   LDLDIRECT 46.0 01/16/2015   TRIG 182.0* 04/17/2015   CHOLHDL 5 04/17/2015    Depression/anxiety symptoms- some days feels more down then others.  Wt Readings from Last 3 Encounters:  11/11/15 221 lb (100.245 kg)  09/30/15 219 lb (99.338 kg)  07/28/15 220 lb (99.791 kg)    Review of Systems  Constitutional: Negative for fever.  HENT: Positive for rhinorrhea and sinus pressure.   Respiratory: Positive for cough.   Cardiovascular: Negative for leg swelling.  Gastrointestinal: Negative for blood in stool.       Intermittent constipation/diarrhea  Genitourinary: Negative for dysuria and hematuria.  Musculoskeletal: Positive for back pain.  Skin: Negative for rash.  Neurological:       + HA's, occasional-weather change is trigger  Hematological: Negative for adenopathy.  Psychiatric/Behavioral:       See HPI       Past Medical History  Diagnosis Date  .  Diabetes mellitus   . Asthma   . Enlarged prostate   . Hyperlipidemia   . Heart disease   . Hypertension   . Blood transfusion   . GERD (gastroesophageal reflux disease)   . Refusal of blood transfusions as patient is Jehovah's Witness   . Neuromuscular disorder (HCC)     DJD  . Coronary artery disease   . CAD (coronary artery disease), non obstructive on cath 2011 04/05/2012  . H/O syncope     Social History   Social History  . Marital Status: Married    Spouse Name: N/A  . Number of Children: N/A  . Years of Education: N/A   Occupational History  . Not on file.   Social History Main Topics  . Smoking status: Never Smoker   . Smokeless tobacco: Never Used  . Alcohol Use: Yes     Comment: rare  . Drug Use: No  . Sexual Activity: Not on file   Other Topics Concern  . Not on file   Social History Narrative   Holter monitor 08/2010: PVCs and sinus tachy.   Sleep Study (02/2008): mild sleep apnea, no indication for CPAP.    Past Surgical History  Procedure Laterality Date  . Appendectomy    . Patent ductus arterious repair      at age 72  . Colonoscopy w/ polypectomy  03/17/11    diminutive polyp  . Cardiac catheterization  2007    [facility]), with RCA 30% narrowing likely due to catheter induced spasm in 09/02/10.  . Back surgery    . Cardiac catheterization  09/02/2010    mod. nonobstructive disease in the RCA and CX, tortuous LAD  . Nm myocar perf wall motion  01/25/2008    mild anteroapical wall ischemia  . Loop recorder implant  04/06/2012    Reveal XT 4529  . Loop recorder implant N/A 04/06/2012    Procedure: LOOP RECORDER IMPLANT;  Surgeon: Sanda Klein, MD;  Location: [place] CATH [facility];  Service: Cardiovascular;  Laterality: N/A;  . Loop recorder explant N/A 01/14/2014    Procedure: LOOP RECORDER EXPLANT;  Surgeon: Sanda Klein, MD;  Location: [facility] CATH [facility];  Service: Cardiovascular;  Laterality: N/A;    Family History  Problem  Relation Age of Onset  . Colon cancer Paternal Grandfather   . Prostate cancer Paternal Grandfather   . Aneurysm Father   . Heart attack Father 39  . Coronary artery disease Father   . Colon cancer Paternal Uncle     x 2  . Colon polyps Mother   . Heart disease Mother   . Coronary artery disease Mother   . Migraines Mother   . Breast cancer Maternal Grandmother   . Stomach cancer Brother   . Liver disease      unsure who it was  . Allergies Daughter     Allergies  Allergen Reactions  . Benadryl [Diphenhydramine Hcl]     "drives me nuts"  . Red Blood Cells     PT. REFUSES ANY BLOOD PRODUCTS - JEHOVAH'S WITNESS.    Current Outpatient Prescriptions on File Prior to Visit  Medication Sig Dispense Refill  . albuterol (PROVENTIL) (2.5 MG/3ML) 0.083% nebulizer solution USE 1 VIAL VIA NEBULIZER  EVERY 6 HOURS AS NEEDED FOR WHEEZING OR SHORTNESS OF  BREATH 600 mL 1  . aspirin 325 MG tablet Take 325 mg by mouth daily.    . budesonide-formoterol (SYMBICORT) 160-4.5 MCG/ACT inhaler Inhale 2 puffs into the lungs 2 (two) times daily. 3 Inhaler 1  . cetirizine (ZYRTEC) 10 MG tablet Take 10 mg by mouth daily.    . clonazePAM (KLONOPIN) 1 MG tablet Take 1 tablet (1 mg total) by mouth daily. 30 tablet 0  . diazepam (VALIUM) 10 MG tablet Take 1 tablet (10 mg total) by mouth once. 1 tablet 0  . diclofenac sodium (VOLTAREN) 1 % GEL Apply 2 g topically 4 (four) times daily.    . fenofibrate (TRICOR) 145 MG tablet Take 1 tablet by mouth  daily 90 tablet 1  . fish oil-omega-3 fatty acids 1000 MG capsule Take 2 g by mouth daily.     . fluocinonide gel (LIDEX) 0.05 % Apply sparingly to scalp twice daily as needed 60 g 2  . gabapentin (NEURONTIN) 300 MG capsule TAKE 1 CAPSULE BY MOUTH AT  6AM, 11:30AM, 4:30PM AND  TAKE 3 CAPSULES AT 10PM. 540 capsule 1  . ketoconazole (NIZORAL) 2 % shampoo Apply 1 application  topically daily as directed 360 mL 1  . lansoprazole (PREVACID) 15 MG capsule Take 15 mg by  mouth daily.    Marland Kitchen lisinopril (PRINIVIL,ZESTRIL) 2.5 MG tablet Take 1 tablet by mouth  daily 90 tablet 1  . meloxicam (MOBIC) 7.5 MG tablet Take 1 tablet (7.5 mg total) by mouth daily. 14 tablet 0  . montelukast (SINGULAIR) 10 MG tablet Take 1 tablet by mouth at  bedtime 90 tablet  1  . Multiple Vitamin (MULTIVITAMIN WITH MINERALS) TABS tablet Take 1 tablet by mouth daily.    . naproxen sodium (ALEVE) 220 MG tablet Take 440 mg by mouth 2 (two) times daily. Take 440 mg in the morning and 220 mg in the evening.    Glory Rosebush VERIO test strip Check blood sugar 3 times  daily 300 each 1  . ramelteon (ROZEREM) 8 MG tablet Take 1 tablet (8 mg total) by mouth at bedtime. 30 tablet 5  . rosuvastatin (CRESTOR) 40 MG tablet Take 1 tablet (40 mg total) by mouth daily. 90 tablet 3  . sertraline (ZOLOFT) 50 MG tablet Take 1 tablet (50 mg total) by mouth daily. 90 tablet 1  . tamsulosin (FLOMAX) 0.4 MG CAPS capsule Take 1 capsule by mouth  daily 90 capsule 1  . tiZANidine (ZANAFLEX) 2 MG tablet Take 1 tablet (2 mg total) by mouth every 6 (six) hours as needed for muscle spasms. 90 tablet 1  . traMADol (ULTRAM) 50 MG tablet Take 1 tablet (50 mg total) by mouth every 6 (six) hours as needed. (Patient taking differently: Take 50 mg by mouth 3 (three) times daily. ) 360 tablet 0  . traZODone (DESYREL) 100 MG tablet Take 1 tablet by mouth at  bedtime 90 tablet 1  . [DISCONTINUED] niacin (NIASPAN) 1000 MG CR tablet Take 1 tablet (1,000 mg total) by mouth at bedtime. 30 tablet 6   No current facility-administered medications on file prior to visit.    BP 122/79 mmHg  Pulse 88  Temp(Src) 98.8 F (37.1 C) (Oral)  Resp 16  Ht 5\' 8"  (1.727 m)  Wt 221 lb (100.245 kg)  BMI 33.61 kg/m2  SpO2 99%    Objective:   Physical Exam  Constitutional: He is oriented to person, place, and time. He appears well-developed and well-nourished. No distress.  HENT:  Head: Normocephalic and atraumatic.  Right Ear: Ear canal  normal. Tympanic membrane is erythematous.  Left Ear: Tympanic membrane and ear canal normal.  Nose: Right sinus exhibits maxillary sinus tenderness and frontal sinus tenderness. Left sinus exhibits maxillary sinus tenderness and frontal sinus tenderness.  Mouth/Throat: No oropharyngeal exudate, posterior oropharyngeal edema or posterior oropharyngeal erythema.  Eyes: No scleral icterus.  Neck: Neck supple. No thyromegaly present.  Cardiovascular: Normal rate and regular rhythm.   No murmur heard. Pulmonary/Chest: Effort normal. No respiratory distress. He has wheezes. He has no rales.  Abdominal: Soft. Bowel sounds are normal. He exhibits no distension. There is no tenderness. There is no rebound and no guarding.  Musculoskeletal: He exhibits no edema.  Lymphadenopathy:    He has no cervical adenopathy.  Neurological: He is alert and oriented to person, place, and time. He has normal strength.  Reflex Scores:      Patellar reflexes are 2+ on the right side and 2+ on the left side. Skin: Skin is warm and dry.  Psychiatric: He has a normal mood and affect. His behavior is normal. Thought content normal.          Assessment & Plan:  R OM/Sinusitis- rx with augmentin.   Hyperlipidemia- asked pt to check with insurance re: fenofibrate cost/recommendations.

## 2015-11-11 NOTE — Patient Instructions (Addendum)
Please complete lab work prior to leaving. Continue to work on [employer], exercise, weight loss. Start augmentin for sinus infection and right ear infection. Start Advair for your asthma- let me know if insurance will not cover.

## 2015-11-11 NOTE — Assessment & Plan Note (Signed)
Mild wheezing today. Will see if his insurance will cover advair (could not afford symbicort). If not, consider addition of oral pred taper. Continue singulair.

## 2015-11-11 NOTE — Progress Notes (Signed)
Pre visit review using our clinic review tool, if applicable. No additional management support is needed unless otherwise documented below in the visit note. 

## 2015-11-11 NOTE — Assessment & Plan Note (Signed)
Discussed healthy diet, exercise, weight loss. Obtain labs as below.  Immunizations up to date.

## 2015-11-11 NOTE — Assessment & Plan Note (Signed)
Controlled. Advised pt to continue metformin 500, 2 tabs once daily.

## 2015-11-12 ENCOUNTER — Telehealth: Payer: Self-pay | Admitting: Family

## 2015-11-12 DIAGNOSIS — R748 Abnormal levels of other serum enzymes: Secondary | ICD-10-CM

## 2015-11-12 NOTE — Telephone Encounter (Signed)
LFT mildly elevated.  Lets repeat in 1 month, dx abnormal LFT.  Also, trigs still elevated. Please check with his insurance re: fenofibrate alternatives and continue to work on diet, exercise, weight. Loss.

## 2015-11-12 NOTE — Telephone Encounter (Signed)
Called  Patient to review labs,no answer, no answering machine.

## 2015-11-17 NOTE — Telephone Encounter (Signed)
Notified pt of below results and he voices understanding. Future order entered.

## 2015-11-19 ENCOUNTER — Ambulatory Visit: Payer: Self-pay | Admitting: Physical Medicine & Rehabilitation

## 2015-12-09 ENCOUNTER — Other Ambulatory Visit (INDEPENDENT_AMBULATORY_CARE_PROVIDER_SITE_OTHER): Payer: Medicare Other

## 2015-12-09 DIAGNOSIS — R748 Abnormal levels of other serum enzymes: Secondary | ICD-10-CM | POA: Diagnosis not present

## 2015-12-09 LAB — HEPATIC FUNCTION PANEL
ALBUMIN: 4.2 g/dL (ref 3.5–5.2)
ALT: 56 U/L — ABNORMAL HIGH (ref 0–53)
AST: 48 U/L — ABNORMAL HIGH (ref 0–37)
Alkaline Phosphatase: 108 U/L (ref 39–117)
Bilirubin, Direct: 0.1 mg/dL (ref 0.0–0.3)
TOTAL PROTEIN: 7.6 g/dL (ref 6.0–8.3)
Total Bilirubin: 0.6 mg/dL (ref 0.2–1.2)

## 2015-12-11 ENCOUNTER — Other Ambulatory Visit: Payer: Self-pay

## 2016-01-05 ENCOUNTER — Encounter: Payer: Self-pay | Admitting: Family

## 2016-01-05 NOTE — Telephone Encounter (Signed)
Duplicate message. See additional pt email from 01/05/16.

## 2016-01-06 ENCOUNTER — Encounter: Payer: Self-pay | Admitting: Physician Assistant

## 2016-01-06 ENCOUNTER — Ambulatory Visit (INDEPENDENT_AMBULATORY_CARE_PROVIDER_SITE_OTHER): Payer: Medicare Other | Admitting: Physician Assistant

## 2016-01-06 ENCOUNTER — Encounter: Payer: Self-pay | Admitting: Cardiovascular Disease

## 2016-01-06 ENCOUNTER — Telehealth: Payer: Self-pay | Admitting: Cardiovascular Disease

## 2016-01-06 VITALS — BP 106/50 | HR 90 | Temp 98.2°F | Ht 68.0 in | Wt 222.0 lb

## 2016-01-06 DIAGNOSIS — J4541 Moderate persistent asthma with (acute) exacerbation: Secondary | ICD-10-CM

## 2016-01-06 DIAGNOSIS — J209 Acute bronchitis, unspecified: Secondary | ICD-10-CM | POA: Diagnosis not present

## 2016-01-06 MED ORDER — DOXYCYCLINE HYCLATE 100 MG PO CAPS: 100.0000 mg | ORAL_CAPSULE | Freq: Two times a day (BID) | ORAL | Status: DC

## 2016-01-06 MED ORDER — PREDNISONE 20 MG PO TABS: 40.0000 mg | ORAL_TABLET | Freq: Every day | ORAL | Status: DC

## 2016-01-06 MED ORDER — BENZONATATE 100 MG PO CAPS: 100.0000 mg | ORAL_CAPSULE | Freq: Three times a day (TID) | ORAL | Status: DC | PRN

## 2016-01-06 NOTE — Telephone Encounter (Signed)
Pt states he has been feeling very tired and fatigued lately. He has had a constant headache that will not completely go away. His wife says he's been falling asleep easily. BP today was 100/50 at the PCP.  He just left his PCP today who said he needed to call Dr Sallyanne Kuster to see if the matter may be r/t his heart. Pt denies SOB, CP, dizziness, or palpitations at this time.  He is due for his 1 yr f/u with Dr Sallyanne Kuster so appt was scheduled for 01/11/16.   Will route to Dr Sallyanne Kuster for any further advice.

## 2016-01-06 NOTE — Telephone Encounter (Signed)
New message     Patient calling verbalize he went to see his PCP today -   Pt c/o BP issue: STAT if pt c/o blurred vision, one-sided weakness or slurred speech  1. What are your last 5 BP readings? 100/'50 additional 6 times blood pressure was taken - did not have reading    2. Are you having any other symptoms (ex. Dizziness, headache, blurred vision, passed out)? Dizziness a little, headache, fatigue,  Tired all the time.    3. What is your BP issue? Was told to call the office

## 2016-01-06 NOTE — Progress Notes (Signed)
Pre visit review using our clinic review tool, if applicable. No additional management support is needed unless otherwise documented below in the visit note. 

## 2016-01-06 NOTE — Patient Instructions (Addendum)
Take antibiotic (Doxycycline) as directed.  Increase fluids.  Get plenty of rest. Use Mucinex for congestion. Use steroid as directed. Limit carbohydrates and continue diabetes medication. Take a daily probiotic (I recommend Align or Culturelle, but even Activia Yogurt may be beneficial).  A humidifier placed in the bedroom may offer some relief for a dry, scratchy throat of nasal irritation.  Read information below on acute bronchitis. Please call or return to clinic if symptoms are not improving.  If sugars > 300 with medication, please call the office and stop steroid.  Acute Bronchitis Bronchitis is when the airways that extend from the windpipe into the lungs get red, puffy, and painful (inflamed). Bronchitis often causes thick spit (mucus) to develop. This leads to a cough. A cough is the most common symptom of bronchitis. In acute bronchitis, the condition usually begins suddenly and goes away over time (usually in 2 weeks). Smoking, allergies, and asthma can make bronchitis worse. Repeated episodes of bronchitis may cause more lung problems.  HOME CARE  Rest.  Drink enough fluids to keep your pee (urine) clear or pale yellow (unless you need to limit fluids as told by your doctor).  Only take over-the-counter or prescription medicines as told by your doctor.  Avoid smoking and secondhand smoke. These can make bronchitis worse. If you are a smoker, think about using nicotine gum or skin patches. Quitting smoking will help your lungs heal faster.  Reduce the chance of getting bronchitis again by:  Washing your hands often.  Avoiding people with cold symptoms.  Trying not to touch your hands to your mouth, nose, or eyes.  Follow up with your doctor as told.  GET HELP IF: Your symptoms do not improve after 1 week of treatment. Symptoms include:  Cough.  Fever.  Coughing up thick spit.  Body aches.  Chest congestion.  Chills.  Shortness of breath.  Sore throat.  GET  HELP RIGHT AWAY IF:   You have an increased fever.  You have chills.  You have severe shortness of breath.  You have bloody thick spit (sputum).  You throw up (vomit) often.  You lose too much body fluid (dehydration).  You have a severe headache.  You faint.  MAKE SURE YOU:   Understand these instructions.  Will watch your condition.  Will get help right away if you are not doing well or get worse. Document Released: 02/22/2008 Document Revised: 05/08/2013 Document Reviewed: 02/26/2013 [facility] Patient Information 2015 [place], [place]. This information is not intended to replace advice given to you by your health care provider. Make sure you discuss any questions you have with your health care provider.

## 2016-01-06 NOTE — Progress Notes (Signed)
Patient presents to clinic today c/o 2 weeks of chest congestion and cough that has become productive over the past 4-5 days. Endorses wheezing and chest tightness. Denies fever, chills, chest pain, sinus pressure/sinus pain. Denies sick contact. Was treated for sinusitis a few weeks ago with augmentin.   Past Medical History  Diagnosis Date  . Diabetes mellitus   . Asthma   . Enlarged prostate   . Hyperlipidemia   . Heart disease   . Hypertension   . Blood transfusion   . GERD (gastroesophageal reflux disease)   . Refusal of blood transfusions as patient is Jehovah's Witness   . Neuromuscular disorder (HCC)     DJD  . Coronary artery disease   . CAD (coronary artery disease), non obstructive on cath 2011 04/05/2012  . H/O syncope     Current Outpatient Prescriptions on File Prior to Visit  Medication Sig Dispense Refill  . albuterol (PROVENTIL) (2.5 MG/3ML) 0.083% nebulizer solution USE 1 VIAL VIA NEBULIZER  EVERY 6 HOURS AS NEEDED FOR WHEEZING OR SHORTNESS OF  BREATH 600 mL 1  . aspirin 325 MG tablet Take 325 mg by mouth daily.    . cetirizine (ZYRTEC) 10 MG tablet Take 10 mg by mouth daily.    . clonazePAM (KLONOPIN) 1 MG tablet Take 1 tablet (1 mg total) by mouth daily. 30 tablet 0  . diazepam (VALIUM) 10 MG tablet Take 1 tablet (10 mg total) by mouth once. 1 tablet 0  . diclofenac sodium (VOLTAREN) 1 % GEL Apply 2 g topically 4 (four) times daily.    . fenofibrate (TRICOR) 145 MG tablet Take 1 tablet by mouth  daily 90 tablet 1  . fish oil-omega-3 fatty acids 1000 MG capsule Take 2 g by mouth daily.     . fluocinonide gel (LIDEX) 0.05 % Apply sparingly to scalp twice daily as needed 60 g 2  . Fluticasone-Salmeterol (ADVAIR) 100-50 MCG/DOSE AEPB Inhale 1 puff into the lungs 2 (two) times daily. 1 each 3  . gabapentin (NEURONTIN) 300 MG capsule TAKE 1 CAPSULE BY MOUTH AT  6AM, 11:30AM, 4:30PM AND  TAKE 3 CAPSULES AT 10PM. 540 capsule 1  . ketoconazole (NIZORAL) 2 % shampoo  Apply 1 application  topically daily as directed 360 mL 1  . lansoprazole (PREVACID) 15 MG capsule Take 15 mg by mouth daily.    Marland Kitchen lisinopril (PRINIVIL,ZESTRIL) 2.5 MG tablet Take 1 tablet by mouth  daily 90 tablet 1  . meloxicam (MOBIC) 7.5 MG tablet Take 1 tablet (7.5 mg total) by mouth daily. 14 tablet 0  . metFORMIN (GLUCOPHAGE-XR) 500 MG 24 hr tablet Take 2 tablet by mouth  daily with breakfast 90 tablet 1  . montelukast (SINGULAIR) 10 MG tablet Take 1 tablet by mouth at  bedtime 90 tablet 1  . Multiple Vitamin (MULTIVITAMIN WITH MINERALS) TABS tablet Take 1 tablet by mouth daily.    . naproxen sodium (ALEVE) 220 MG tablet Take 440 mg by mouth 2 (two) times daily. Take 440 mg in the morning and 220 mg in the evening.    . nitroGLYCERIN (NITROSTAT) 0.4 MG SL tablet Place 1 tablet (0.4 mg total) under the tongue every 5 (five) minutes as needed for chest pain. 30 tablet 1  . ONETOUCH VERIO test strip Check blood sugar 3 times  daily 300 each 1  . ramelteon (ROZEREM) 8 MG tablet Take 1 tablet (8 mg total) by mouth at bedtime. 30 tablet 5  . rosuvastatin (CRESTOR) 40  MG tablet Take 1 tablet (40 mg total) by mouth daily. 90 tablet 3  . sertraline (ZOLOFT) 50 MG tablet Take 1 tablet (50 mg total) by mouth daily. 90 tablet 1  . tamsulosin (FLOMAX) 0.4 MG CAPS capsule Take 1 capsule by mouth  daily 90 capsule 1  . tiZANidine (ZANAFLEX) 2 MG tablet Take 1 tablet (2 mg total) by mouth every 6 (six) hours as needed for muscle spasms. 90 tablet 1  . traMADol (ULTRAM) 50 MG tablet Take 1 tablet (50 mg total) by mouth every 6 (six) hours as needed. (Patient taking differently: Take 50 mg by mouth 3 (three) times daily. ) 360 tablet 0  . traZODone (DESYREL) 100 MG tablet Take 1 tablet by mouth at  bedtime 90 tablet 1  . zolpidem (AMBIEN) 10 MG tablet Take 1 tablet (10 mg total) by mouth at bedtime as needed for sleep. 30 tablet 0  . [DISCONTINUED] niacin (NIASPAN) 1000 MG CR tablet Take 1 tablet (1,000 mg  total) by mouth at bedtime. 30 tablet 6   No current facility-administered medications on file prior to visit.    Allergies  Allergen Reactions  . Benadryl [Diphenhydramine Hcl]     "drives me nuts"  . Red Blood Cells     PT. REFUSES ANY BLOOD PRODUCTS - JEHOVAH'S WITNESS.    Family History  Problem Relation Age of Onset  . Colon cancer Paternal Grandfather   . Prostate cancer Paternal Grandfather   . Aneurysm Father   . Heart attack Father 40  . Coronary artery disease Father   . Colon cancer Paternal Uncle     x 2  . Colon polyps Mother   . Heart disease Mother   . Coronary artery disease Mother   . Migraines Mother   . Breast cancer Maternal Grandmother   . Stomach cancer Brother   . Liver disease      unsure who it was  . Allergies Daughter     Social History   Social History  . Marital Status: Married    Spouse Name: N/A  . Number of Children: N/A  . Years of Education: N/A   Social History Main Topics  . Smoking status: Never Smoker   . Smokeless tobacco: Never Used  . Alcohol Use: Yes     Comment: rare  . Drug Use: No  . Sexual Activity: Not Asked   Other Topics Concern  . None   Social History Narrative   Holter monitor 08/2010: PVCs and sinus tachy.   Sleep Study (02/2008): mild sleep apnea, no indication for CPAP.   Review of Systems - See HPI.  All other ROS are negative.  BP 106/50 mmHg  Pulse 90  Temp(Src) 98.2 F (36.8 C) (Oral)  Ht 5\' 8"  (1.727 m)  Wt 222 lb (100.699 kg)  BMI 33.76 kg/m2  SpO2 97%  Physical Exam  Constitutional: He is oriented to person, place, and time and well-developed, well-nourished, and in no distress.  HENT:  Head: Normocephalic and atraumatic.  Right Ear: External ear normal.  Left Ear: External ear normal.  Nose: Nose normal.  Mouth/Throat: Oropharynx is clear and moist. No oropharyngeal exudate.  TM within normal limits bilaterally.  Eyes: Conjunctivae are normal.  Neck: Neck supple.    Cardiovascular: Normal rate, regular rhythm, normal heart sounds and intact distal pulses.   Pulmonary/Chest: Effort normal. No respiratory distress. He has wheezes. He has no rales. He exhibits no tenderness.  Neurological: He is alert and oriented  to person, place, and time.  Skin: Skin is warm and dry. No rash noted.  Psychiatric: Affect normal.  Vitals reviewed.   Recent Results (from the past 2160 hour(s))  Hepatic function panel     Status: Abnormal   Collection Time: 11/11/15 10:04 AM  Result Value Ref Range   Total Bilirubin 0.7 0.2 - 1.2 mg/dL   Bilirubin, Direct 0.1 0.0 - 0.3 mg/dL   Alkaline Phosphatase 113 39 - 117 U/L   AST 49 (H) 0 - 37 U/L   ALT 64 (H) 0 - 53 U/L   Total Protein 7.4 6.0 - 8.3 g/dL   Albumin 4.2 3.5 - 5.2 g/dL  Lipid panel     Status: Abnormal   Collection Time: 11/11/15 10:04 AM  Result Value Ref Range   Cholesterol 191 0 - 200 mg/dL    Comment: ATP III Classification       Desirable:  < 200 mg/dL               Borderline High:  200 - 239 mg/dL          High:  > = 240 mg/dL   Triglycerides (H) 0.0 - 149.0 mg/dL    471.0 Triglyceride is over 400; calculations on Lipids are invalid.    Comment: Normal:  <150 mg/dLBorderline High:  150 - 199 mg/dL   HDL 33.70 (L) >39.00 mg/dL   Total CHOL/HDL Ratio 6     Comment:                Men          Women1/2 Average Risk     3.4          3.3Average Risk          5.0          4.42X Average Risk          9.6          7.13X Average Risk          15.0          11.0                      LDL cholesterol, direct     Status: None   Collection Time: 11/11/15 10:04 AM  Result Value Ref Range   Direct LDL 55.0 mg/dL    Comment: Optimal:  <100 mg/dLNear or Above Optimal:  100-129 mg/dLBorderline High:  130-159 mg/dLHigh:  160-189 mg/dLVery High:  >190 mg/dL  Hepatic function panel     Status: Abnormal   Collection Time: 12/09/15  8:43 AM  Result Value Ref Range   Total Bilirubin 0.6 0.2 - 1.2 mg/dL   Bilirubin, Direct  0.1 0.0 - 0.3 mg/dL   Alkaline Phosphatase 108 39 - 117 U/L   AST 48 (H) 0 - 37 U/L   ALT 56 (H) 0 - 53 U/L   Total Protein 7.6 6.0 - 8.3 g/dL   Albumin 4.2 3.5 - 5.2 g/dL    Assessment/Plan: 1. Moderate persistent asthma with acute bronchitis and acute exacerbation Will begin Prednisone burst -- 40 mg x 3 days giving diabetes status (although well-controlled). Will begin Doxycycline. Low carb diet discussed. Supportive measures reviewed. Tessalon for cough. Follow-up PRN if symptoms are not resolving.

## 2016-01-08 ENCOUNTER — Encounter: Payer: Self-pay | Admitting: Physician Assistant

## 2016-01-08 ENCOUNTER — Ambulatory Visit (INDEPENDENT_AMBULATORY_CARE_PROVIDER_SITE_OTHER): Payer: Medicare Other | Admitting: Physician Assistant

## 2016-01-08 VITALS — BP 120/80 | HR 93 | Ht 68.0 in | Wt 220.2 lb

## 2016-01-08 DIAGNOSIS — R002 Palpitations: Secondary | ICD-10-CM

## 2016-01-08 DIAGNOSIS — R0789 Other chest pain: Secondary | ICD-10-CM

## 2016-01-08 DIAGNOSIS — I4510 Unspecified right bundle-branch block: Secondary | ICD-10-CM | POA: Diagnosis not present

## 2016-01-08 LAB — TSH: TSH: 0.95 mIU/L (ref 0.40–4.50)

## 2016-01-08 NOTE — Patient Instructions (Addendum)
Medication Instructions:   Your physician recommends that you continue on your current medications as directed. Please refer to the Current Medication list given to you today.   If you need a refill on your cardiac medications before your next appointment, please call your pharmacy.  Labwork:  TSH    Testing/Procedures:  Your physician has recommended that you wear an event monitor. Event monitors are medical devices that record the heart's electrical activity. Doctors most often [place] these monitors to diagnose arrhythmias. Arrhythmias are problems with the speed or rhythm of the heartbeat. The monitor is a small, portable device. You can wear one while you do your normal daily activities. This is usually used to diagnose what is causing palpitations/syncope (passing out).    Follow-Up: IN 5 WEEKS WITH DR C AT [facility] FOR MONITOR RESULTS   Any Other Special Instructions Will Be Listed Below (If Applicable).

## 2016-01-08 NOTE — Telephone Encounter (Signed)
Followed up with pt this morning. Still feeling very fatigued and slight headache.  He has not checked his BP in a couple days because he does not have a cuff. He stated he felt "fluttering" in his just for a few moments yesterday morning. Denies CP, SOB, dizziness or lightheadedness.  Pt scheduled Flex visit at [address] office for 1100 today. Pt verbalized understanding.  Will route to Dr Sallyanne Kuster and [place].

## 2016-01-08 NOTE — Progress Notes (Signed)
Cardiology Office Note   Date:  01/08/2016   ID:  Cody Raymond, DOB October 01, 1970, MRN [id number]  PCP:  Nance Pear., NP  Cardiologist:  Dr. Sallyanne Kuster  Chief Complaint  Patient presents with  . Follow-up    seen for Dr. Sallyanne Kuster  . Palpitations      History of Present Illness: Cody Raymond is a 45 y.o. male who presents for follow-up evaluation of headache, weakness, fluttering sensation. 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 Because of complaints of chest discomfort, he underwent coronary angiography in 2009 and 2011 with minimal evidence of CAD. The studies were performed due to false positive nuclear stress test. He did have a negative stress test on 11/12/2014. Last echocardiogram obtained on 10/23/2014 showed EF 60-65%,  No RWMA, moderate concentric LVH. Although there was a previous recommendation of Dr. Caryl Comes for cardiac MRI to distinguish whether or not he truly has hypertrophic cardiomyopathy, however he has claustrophobia and this study was never performed. His mother did have undergo septal myomectomy for HOCM. He had a loop recorder placed on 04/06/2012 and was explanted on 01/14/2014.  For the past several weeks, he has been having tachycardic palpitation that last from a few minutes to half hour. He takes nitroglycerin for it. He also have an atypical sharp focal chest pain under the left breast that is worse with palpation. He had some dizziness, his blood pressure was borderline low with a systolic blood pressure in the 100s in the last few days. He has stopped his lisinopril. He also endorsed fatigue. For the past week his palpitation has worsened. Prior to that, he was treated with Augmentin for asthma exacerbation with bronchitis. His pulmonary symptom  improved but did not completely resolve, he was treated with another course of Cipro and a steroid taper. Now his pulmonary symptom has significantly improved.    Past Medical History  Diagnosis Date  . Diabetes mellitus   . Asthma   . Enlarged prostate   . Hyperlipidemia   . Heart disease   . Hypertension   . Blood transfusion   . GERD (gastroesophageal reflux disease)   . Refusal of blood transfusions as patient is Jehovah's Witness   . Neuromuscular disorder (HCC)     DJD  . Coronary artery disease   . CAD (coronary artery disease), non obstructive on cath 2011 04/05/2012  . H/O syncope     Past Surgical History  Procedure Laterality Date  . Appendectomy    . Patent ductus arterious repair      at age 64  . Colonoscopy w/ polypectomy  03/17/11    diminutive polyp  . Cardiac catheterization  2007    Clean Cardiac Cath [facility]), with RCA 30% narrowing likely due to catheter induced spasm in 09/02/10.  . Back surgery    . Cardiac catheterization  09/02/2010    mod. nonobstructive disease in the RCA and CX, tortuous LAD  . Nm myocar perf wall motion  01/25/2008    mild anteroapical wall ischemia  . Loop recorder implant  04/06/2012    Reveal XT 4529  . Loop recorder implant N/A 04/06/2012    Procedure: LOOP RECORDER IMPLANT;  Surgeon: Sanda Klein, MD;  Location: [place] CATH LAB;  Service: Cardiovascular;  Laterality: N/A;  . Loop recorder explant N/A 01/14/2014    Procedure: LOOP  RECORDER EXPLANT;  Surgeon: Sanda Klein, MD;  Location: [facility] CATH LAB;  Service: Cardiovascular;  Laterality: N/A;     Current Outpatient Prescriptions  Medication Sig Dispense Refill  . albuterol (PROVENTIL) (2.5 MG/3ML) 0.083% nebulizer solution USE 1 VIAL VIA NEBULIZER  EVERY 6 HOURS AS NEEDED FOR WHEEZING OR SHORTNESS OF  BREATH 600 mL 1  . aspirin 325 MG tablet Take 325 mg by mouth daily.    . benzonatate (TESSALON) 100 MG capsule Take 100 mg by mouth 3 (three) times daily as needed  for cough.    . cetirizine (ZYRTEC) 10 MG tablet Take 10 mg by mouth daily.    . clonazePAM (KLONOPIN) 1 MG tablet Take 1 tablet (1 mg total) by mouth daily. 30 tablet 0  . diazepam (VALIUM) 10 MG tablet Take 1 tablet (10 mg total) by mouth once. 1 tablet 0  . diclofenac sodium (VOLTAREN) 1 % GEL Apply 2 g topically 4 (four) times daily.    Marland Kitchen doxycycline (VIBRAMYCIN) 100 MG capsule Take 1 capsule (100 mg total) by mouth 2 (two) times daily. 14 capsule 0  . fenofibrate (TRICOR) 145 MG tablet Take 1 tablet by mouth  daily 90 tablet 1  . fish oil-omega-3 fatty acids 1000 MG capsule Take 2 g by mouth daily.     . Fluticasone-Salmeterol (ADVAIR) 100-50 MCG/DOSE AEPB Inhale 1 puff into the lungs 2 (two) times daily. 1 each 3  . gabapentin (NEURONTIN) 300 MG capsule TAKE 1 CAPSULE BY MOUTH AT  6AM, 11:30AM, 4:30PM AND  TAKE 3 CAPSULES AT 10PM. 540 capsule 1  . ketoconazole (NIZORAL) 2 % shampoo Apply 1 application  topically daily as directed 360 mL 1  . lansoprazole (PREVACID) 15 MG capsule Take 15 mg by mouth daily.    Marland Kitchen lisinopril (PRINIVIL,ZESTRIL) 2.5 MG tablet Take 1 tablet by mouth  daily 90 tablet 1  . meloxicam (MOBIC) 7.5 MG tablet Take 1 tablet (7.5 mg total) by mouth daily. 14 tablet 0  . metFORMIN (GLUCOPHAGE-XR) 500 MG 24 hr tablet Take 2 tablet by mouth  daily with breakfast 90 tablet 1  . montelukast (SINGULAIR) 10 MG tablet Take 1 tablet by mouth at  bedtime 90 tablet 1  . Multiple Vitamin (MULTIVITAMIN WITH MINERALS) TABS tablet Take 1 tablet by mouth daily.    . naproxen sodium (ALEVE) 220 MG tablet Take 440 mg by mouth 2 (two) times daily. Take 440 mg in the morning and 220 mg in the evening.    . nitroGLYCERIN (NITROSTAT) 0.4 MG SL tablet Place 1 tablet (0.4 mg total) under the tongue every 5 (five) minutes as needed for chest pain. 30 tablet 1  . ONETOUCH VERIO test strip Check blood sugar 3 times  daily 300 each 1  . predniSONE (DELTASONE) 20 MG tablet Take 2 tablets (40 mg total)  by mouth daily with breakfast. 6 tablet 0  . ramelteon (ROZEREM) 8 MG tablet Take 1 tablet (8 mg total) by mouth at bedtime. 30 tablet 5  . rosuvastatin (CRESTOR) 40 MG tablet Take 1 tablet (40 mg total) by mouth daily. 90 tablet 3  . sertraline (ZOLOFT) 50 MG tablet Take 1 tablet (50 mg total) by mouth daily. 90 tablet 1  . tamsulosin (FLOMAX) 0.4 MG CAPS capsule Take 1 capsule by mouth  daily 90 capsule 1  . tiZANidine (ZANAFLEX) 2 MG tablet Take 1 tablet (2 mg total) by mouth every 6 (six) hours as needed for muscle spasms. 90 tablet 1  .  traMADol (ULTRAM) 50 MG tablet Take 50 mg by mouth every 6 (six) hours as needed for severe pain.    . traZODone (DESYREL) 100 MG tablet Take 1 tablet by mouth at  bedtime 90 tablet 1  . zolpidem (AMBIEN) 10 MG tablet Take 1 tablet (10 mg total) by mouth at bedtime as needed for sleep. 30 tablet 0  . [DISCONTINUED] niacin (NIASPAN) 1000 MG CR tablet Take 1 tablet (1,000 mg total) by mouth at bedtime. 30 tablet 6   No current facility-administered medications for this visit.    Allergies:   Benadryl and Red blood cells    Social History:  The patient  reports that he has never smoked. He has never used smokeless tobacco. He reports that he drinks alcohol. He reports that he does not use illicit drugs.   Family History:  The patient's family history includes Allergies in his daughter; Aneurysm in his father; Breast cancer in his maternal grandmother; Colon cancer in his paternal grandfather and paternal uncle; Colon polyps in his mother; Coronary artery disease in his father and mother; Heart attack (age of onset: 57) in his father; Heart disease in his mother; Migraines in his mother; Prostate cancer in his paternal grandfather; Stomach cancer in his brother.    ROS:  Please see the history of present illness.   Otherwise, review of systems are positive for palpitation, wheezing, pinpoint chest discomfort under her left breast.   All other systems are  reviewed and negative.    PHYSICAL EXAM: VS:  BP 120/80 mmHg  Pulse 93  Ht 5\' 8"  (1.727 m)  Wt 220 lb 3.2 oz (99.882 kg)  BMI 33.49 kg/m2 , BMI Body mass index is 33.49 kg/(m^2). GEN: Well nourished, well developed, in no acute distress HEENT: normal Neck: no JVD, carotid bruits, or masses Cardiac: RRR; no murmurs, rubs, or gallops,no edema  Respiratory:  clear to auscultation bilaterally, normal work of breathing + mild expiratory wheezing. GI: soft, nontender, nondistended, + BS MS: no deformity or atrophy Skin: warm and dry, no rash Neuro:  Strength and sensation are intact Psych: euthymic mood, full affect   EKG:  EKG is ordered today. The ekg ordered today demonstrates normal sinus rhythm, incomplete right bundle-branch block, QTC 447   Recent Labs: 01/20/2015: B Natriuretic Peptide 4.6 04/17/2015: Hemoglobin 14.2; Platelets 269.0 09/30/2015: BUN 16; Creatinine, Ser 0.86; Potassium 3.9; Sodium 136 12/09/2015: ALT 56*    Lipid Panel    Component Value Date/Time   CHOL 191 11/11/2015 1004   TRIG * 11/11/2015 1004    471.0 Triglyceride is over 400; calculations on Lipids are invalid.   HDL 33.70* 11/11/2015 1004   CHOLHDL 6 11/11/2015 1004   VLDL 36.4 04/17/2015 0940   LDLCALC 122* 04/17/2015 0940   LDLDIRECT 55.0 11/11/2015 1004      Wt Readings from Last 3 Encounters:  01/08/16 220 lb 3.2 oz (99.882 kg)  01/06/16 222 lb (100.699 kg)  11/11/15 221 lb (100.245 kg)      Other studies Reviewed: Additional studies/ records that were reviewed today include:   Echo 10/23/2014 LV EF: 60% -  65%  ------------------------------------------------------------------- Indications:   786.05 Dyspnea.  ------------------------------------------------------------------- Study Conclusions  - Left ventricle: The cavity size was normal. There was moderate concentric hypertrophy. Systolic function was normal. The estimated ejection fraction was in the range of 60% to  65%. Wall motion was normal; there were no regional wall motion abnormalities. Left ventricular diastolic function parameters were normal. - Aortic valve:  There was no regurgitation. - Ascending aorta: The ascending aorta was normal in size. - Mitral valve: Structurally normal valve. There was no significant regurgitation. - Left atrium: The atrium was normal in size. - Right ventricle: The cavity size was normal. Wall thickness was normal. Systolic pressure was within the normal range. - Atrial septum: No defect or patent foramen ovale was identified. - Tricuspid valve: Structurally normal valve. There was trivial regurgitation. - Pulmonic valve: There was no regurgitation. - Pulmonary arteries: Systolic pressure was within the normal range. - Inferior vena cava: The vessel was normal in size. The respirophasic diameter changes were in the normal range (= 50%), consistent with normal central venous pressure. - Pericardium, extracardiac: The pericardium was normal in appearance.  Impressions:  - Moderate concentric LVH, otherwise normal study.    Myoview 11/12/2014  Impression Exercise Capacity: Lexiscan with low level exercise. BP Response: Normal blood pressure response. Clinical Symptoms: There is chest pain and dyspnea ECG Impression: No significant ST segment change suggestive of ischemia. Comparison with Prior Nuclear Study: Compared to 01/25/08, description of defect appears to be similar but no ischemia at present  Overall Impression: Low risk stress nuclear study with a small, moderate intensity, fixed distal anterior/apical defect suggestive of prominent thinning vs soft tissue attenuation; no significant ischemia.  LV Wall Motion: NL LV Function; NL Wall Motion    Review of the above records demonstrates:   Patient had history of obstructive CAD, had recent negative ischemic workup in 2016.   ASSESSMENT AND PLAN:  1.   Palpitation  - he has been having worsening palpitation for the past week, does not occur on a daily basis. Describe it as tachycardic palpitation last several minutes to half a hour. Relived with nitro.   - he says the onset of palpitations precede recent asthma exacerbation with bronchitis.  - will obtain 30 day event monitor to further assess. His EKG today is unchanged. Will obtain TSH, last TSH normal 2016  2. Hypertension: his BP is actually 120/80 not on any BP med. Although lisinopril is listed as one of the medications, however per patient, his blood pressure has been either normal or borderline low recently, he has not been taking the lisinopril. We will continue to hold lisinopril at this time.  3. Asthma exacerbation with bronchitis: Treated by PCP, finished a course of Augmentin, currently on another course of Cipro and steroid taper. Still have wheezing on physical exam. However symptom is improving. QTC on EKG 447.  4. Nonobstrucitve CAD: Although he does have chest discomfort, this cath on 09/03/2010 very reassuring, only has 20-30% stenosis in RCA and left circumflex. He described his current chest discomfort is pain on palpation under the left breast which is clearly noncardiac,  5. Hyperlipidemia: Lipid panel 11/11/2015 chol 191, triglyceride 471, HDL 33.7. Continue fenofibrate  Current medicines are reviewed at length with the patient today.  The patient does not have concerns regarding medicines.  The following changes have been made:  no change  Labs/ tests ordered today include:  Orders Placed This Encounter  Procedures  . TSH  . Cardiac event monitor  . EKG 12-Lead     Disposition:   FU with Dr. Sallyanne Kuster in 5 weeks  Signed, Almyra Deforest, [place]  01/08/2016 12:27 PM    [address], [place], [place]  [address] Phone: [phone]; Fax: [phone]

## 2016-01-08 NOTE — Telephone Encounter (Signed)
Pt seen by Almyra Deforest at [address] office today.

## 2016-01-11 ENCOUNTER — Ambulatory Visit: Payer: Self-pay | Admitting: Cardiovascular Disease

## 2016-01-12 ENCOUNTER — Ambulatory Visit (INDEPENDENT_AMBULATORY_CARE_PROVIDER_SITE_OTHER): Payer: Medicare Other

## 2016-01-12 DIAGNOSIS — R002 Palpitations: Secondary | ICD-10-CM

## 2016-01-20 ENCOUNTER — Emergency Department (HOSPITAL_COMMUNITY): Payer: Medicare Other

## 2016-01-20 ENCOUNTER — Encounter (HOSPITAL_COMMUNITY): Payer: Self-pay | Admitting: Emergency Medicine

## 2016-01-20 ENCOUNTER — Emergency Department (HOSPITAL_COMMUNITY)
Admission: EM | Admit: 2016-01-20 | Discharge: 2016-01-20 | Disposition: A | Discharge status: Home / Self Care | Payer: Medicare Other | Attending: Emergency Medicine | Admitting: Emergency Medicine

## 2016-01-20 ENCOUNTER — Other Ambulatory Visit: Payer: Self-pay

## 2016-01-20 DIAGNOSIS — Z7982 Long term (current) use of aspirin: Secondary | ICD-10-CM | POA: Insufficient documentation

## 2016-01-20 DIAGNOSIS — I10 Essential (primary) hypertension: Secondary | ICD-10-CM | POA: Insufficient documentation

## 2016-01-20 DIAGNOSIS — Z791 Long term (current) use of non-steroidal anti-inflammatories (NSAID): Secondary | ICD-10-CM | POA: Insufficient documentation

## 2016-01-20 DIAGNOSIS — Z7984 Long term (current) use of oral hypoglycemic drugs: Secondary | ICD-10-CM | POA: Insufficient documentation

## 2016-01-20 DIAGNOSIS — E785 Hyperlipidemia, unspecified: Secondary | ICD-10-CM | POA: Insufficient documentation

## 2016-01-20 DIAGNOSIS — R42 Dizziness and giddiness: Secondary | ICD-10-CM | POA: Diagnosis not present

## 2016-01-20 DIAGNOSIS — R197 Diarrhea, unspecified: Secondary | ICD-10-CM | POA: Diagnosis not present

## 2016-01-20 DIAGNOSIS — E119 Type 2 diabetes mellitus without complications: Secondary | ICD-10-CM | POA: Insufficient documentation

## 2016-01-20 DIAGNOSIS — Z79899 Other long term (current) drug therapy: Secondary | ICD-10-CM | POA: Diagnosis not present

## 2016-01-20 DIAGNOSIS — J45909 Unspecified asthma, uncomplicated: Secondary | ICD-10-CM | POA: Diagnosis not present

## 2016-01-20 DIAGNOSIS — R109 Unspecified abdominal pain: Secondary | ICD-10-CM | POA: Diagnosis not present

## 2016-01-20 DIAGNOSIS — R61 Generalized hyperhidrosis: Secondary | ICD-10-CM | POA: Insufficient documentation

## 2016-01-20 DIAGNOSIS — R112 Nausea with vomiting, unspecified: Secondary | ICD-10-CM | POA: Insufficient documentation

## 2016-01-20 DIAGNOSIS — I2510 Atherosclerotic heart disease of native coronary artery without angina pectoris: Secondary | ICD-10-CM | POA: Insufficient documentation

## 2016-01-20 DIAGNOSIS — R1110 Vomiting, unspecified: Secondary | ICD-10-CM

## 2016-01-20 LAB — CBC WITH DIFFERENTIAL/PLATELET
Basophils Absolute: 0 10*3/uL (ref 0.0–0.1)
Basophils Relative: 0 %
Eosinophils Absolute: 0.1 10*3/uL (ref 0.0–0.7)
Eosinophils Relative: 1 %
HEMATOCRIT: 40.3 % (ref 39.0–52.0)
HEMOGLOBIN: 13.5 g/dL (ref 13.0–17.0)
LYMPHS ABS: 1.4 10*3/uL (ref 0.7–4.0)
LYMPHS PCT: 20 %
MCH: 26.7 pg (ref 26.0–34.0)
MCHC: 33.5 g/dL (ref 30.0–36.0)
MCV: 79.8 fL (ref 78.0–100.0)
MONOS PCT: 7 %
Monocytes Absolute: 0.5 10*3/uL (ref 0.1–1.0)
NEUTROS ABS: 5 10*3/uL (ref 1.7–7.7)
NEUTROS PCT: 72 %
Platelets: 234 10*3/uL (ref 150–400)
RBC: 5.05 MIL/uL (ref 4.22–5.81)
RDW: 15.8 % — ABNORMAL HIGH (ref 11.5–15.5)
WBC: 6.9 10*3/uL (ref 4.0–10.5)

## 2016-01-20 LAB — COMPREHENSIVE METABOLIC PANEL
ALT: 53 U/L (ref 17–63)
AST: 43 U/L — AB (ref 15–41)
Albumin: 3.8 g/dL (ref 3.5–5.0)
Alkaline Phosphatase: 103 U/L (ref 38–126)
Anion gap: 8 (ref 5–15)
BILIRUBIN TOTAL: 0.7 mg/dL (ref 0.3–1.2)
BUN: 14 mg/dL (ref 6–20)
CO2: 25 mmol/L (ref 22–32)
CREATININE: 1.07 mg/dL (ref 0.61–1.24)
Calcium: 8.9 mg/dL (ref 8.9–10.3)
Chloride: 105 mmol/L (ref 101–111)
GFR calc Af Amer: >60 mL/min (ref >60)
Glucose, Bld: 174 mg/dL — ABNORMAL HIGH (ref 65–99)
POTASSIUM: 4 mmol/L (ref 3.5–5.1)
Sodium: 138 mmol/L (ref 135–145)
TOTAL PROTEIN: 6.9 g/dL (ref 6.5–8.1)

## 2016-01-20 LAB — LIPASE, BLOOD: LIPASE: 49 U/L (ref 11–51)

## 2016-01-20 LAB — I-STAT TROPONIN, ED: Troponin i, poc: 0.01 ng/mL (ref 0.00–0.08)

## 2016-01-20 MED ORDER — ONDANSETRON HCL 4 MG/2ML IJ SOLN
4.0000 mg | Freq: Once | INTRAMUSCULAR | Status: AC
  Administered 2016-01-20: 4 mg via INTRAVENOUS
  Filled 2016-01-20: qty 2

## 2016-01-20 MED ORDER — LOPERAMIDE HCL 2 MG PO CAPS: 2.0000 mg | ORAL_CAPSULE | ORAL | Status: DC | PRN

## 2016-01-20 MED ORDER — SODIUM CHLORIDE 0.9 % IV BOLUS (SEPSIS)
500.0000 mL | Freq: Once | INTRAVENOUS | Status: AC
  Administered 2016-01-20: 500 mL via INTRAVENOUS

## 2016-01-20 MED ORDER — ONDANSETRON 4 MG PO TBDP: 4.0000 mg | ORAL_TABLET | Freq: Three times a day (TID) | ORAL | Status: DC | PRN

## 2016-01-20 MED ORDER — PANTOPRAZOLE SODIUM 40 MG IV SOLR
40.0000 mg | Freq: Once | INTRAVENOUS | Status: AC
  Administered 2016-01-20: 40 mg via INTRAVENOUS
  Filled 2016-01-20: qty 40

## 2016-01-20 NOTE — Discharge Instructions (Signed)
You were seen today for dizziness, chest discomfort, vomiting. You develop diarrhea in the department. This is likely an early viral illness. Your workup is reassuring. Make sure that you maintain good hydration. Follow-up with her primary doctor and cardiologist.  Viral Gastroenteritis Viral gastroenteritis is also known as stomach flu. This condition affects the stomach and intestinal tract. It can cause sudden diarrhea and vomiting. The illness typically lasts 3 to 8 days. Most people develop an immune response that eventually gets rid of the virus. While this natural response develops, the virus can make you quite ill. CAUSES  Many different viruses can cause gastroenteritis, such as rotavirus or noroviruses. You can catch one of these viruses by consuming contaminated food or water. You may also catch a virus by sharing utensils or other personal items with an infected person or by touching a contaminated surface. SYMPTOMS  The most common symptoms are diarrhea and vomiting. These problems can cause a severe loss of body fluids (dehydration) and a body salt (electrolyte) imbalance. Other symptoms may include:  Fever.  Headache.  Fatigue.  Abdominal pain. DIAGNOSIS  Your caregiver can usually diagnose viral gastroenteritis based on your symptoms and a physical exam. A stool sample may also be taken to test for the presence of viruses or other infections. TREATMENT  This illness typically goes away on its own. Treatments are aimed at rehydration. The most serious cases of viral gastroenteritis involve vomiting so severely that you are not able to keep fluids down. In these cases, fluids must be given through an intravenous line (IV). HOME CARE INSTRUCTIONS   Drink enough fluids to keep your urine clear or pale yellow. Drink small amounts of fluids frequently and increase the amounts as tolerated.  Ask your caregiver for specific rehydration instructions.  Avoid:  Foods high in  sugar.  Alcohol.  Carbonated drinks.  Tobacco.  Juice.  Caffeine drinks.  Extremely hot or cold fluids.  Fatty, greasy foods.  Too much intake of anything at one time.  Dairy products until 24 to 48 hours after diarrhea stops.  You may consume probiotics. Probiotics are active cultures of beneficial bacteria. They may lessen the amount and number of diarrheal stools in adults. Probiotics can be found in yogurt with active cultures and in supplements.  Wash your hands well to avoid spreading the virus.  Only take over-the-counter or prescription medicines for pain, discomfort, or fever as directed by your caregiver. Do not give aspirin to children. Antidiarrheal medicines are not recommended.  Ask your caregiver if you should continue to take your regular prescribed and over-the-counter medicines.  Keep all follow-up appointments as directed by your caregiver. SEEK IMMEDIATE MEDICAL CARE IF:   You are unable to keep fluids down.  You do not urinate at least once every 6 to 8 hours.  You develop shortness of breath.  You notice blood in your stool or vomit. This may look like coffee grounds.  You have abdominal pain that increases or is concentrated in one small area (localized).  You have persistent vomiting or diarrhea.  You have a fever.  The patient is a child younger than 3 months, and he or she has a fever.  The patient is a child older than 3 months, and he or she has a fever and persistent symptoms.  The patient is a child older than 3 months, and he or she has a fever and symptoms suddenly get worse.  The patient is a baby, and he or she has  no tears when crying. MAKE SURE YOU:   Understand these instructions.  Will watch your condition.  Will get help right away if you are not doing well or get worse.   This information is not intended to replace advice given to you by your health care provider. Make sure you discuss any questions you have with your  health care provider.   Document Released: 09/05/2005 Document Revised: 11/28/2011 Document Reviewed: 06/22/2011 Elsevier Interactive Patient Education [employer].

## 2016-01-20 NOTE — ED Notes (Signed)
Fluid challenge started. 

## 2016-01-20 NOTE — ED Notes (Signed)
Fetal HR: 143

## 2016-01-20 NOTE — ED Notes (Signed)
Patient transported to X-ray 

## 2016-01-20 NOTE — ED Notes (Signed)
Patient awoke from sleep with dizziness, nausea and left sided headache.  Patient continues now with this, does not take any meds for his hypertension but did take a med that he has taken in the past, Lisinopril, one tablet.  Denies any chest pain or shortness of breath at this time.  Patient was given 324mg  ASA and 4mg  Zofran en route to ED.  Ekg was unremarkable.  VSS.

## 2016-01-20 NOTE — ED Provider Notes (Signed)
CSN: [id number]     Arrival date & time 01/20/16  0043 History  By signing my name below, I, Rowan Blase, attest that this documentation has been prepared under the direction and in the presence of Merryl Hacker, MD . Electronically Signed: Rowan Blase, Scribe. 01/20/2016. 1:37 AM.   Chief Complaint  Patient presents with  . Hypertension   The history is provided by the patient. No language interpreter was used.   HPI Comments:  Cody Raymond is a 45 y.o. male with PMHx of DM, HLD, HTN, GERD and CAD who presents to the Emergency Department complaining of hypertensive episode waking him from sleep tonight. Pt reports associated diaphoresis, dizziness, feeling like he was going to pass out, abdominal pain, nausea. Pt notes current symptoms feel similar to acid reflex and states he had tacos for dinner. He reports blood sugar of 110 measured at home tonight. Reports one episode of vomiting. Denies chest pain or shortness of breath. He is currently under evaluation by Dr. Sallyanne Kuster with concern for Brugada syndrome due to irregular heartbeat and FHx in mother of cardiomyopathy. Denies recent fever, recent illness, syncope or vomiting.   Past Medical History  Diagnosis Date  . Diabetes mellitus   . Asthma   . Enlarged prostate   . Hyperlipidemia   . Heart disease   . Hypertension   . Blood transfusion   . GERD (gastroesophageal reflux disease)   . Refusal of blood transfusions as patient is Jehovah's Witness   . Neuromuscular disorder (HCC)     DJD  . Coronary artery disease   . CAD (coronary artery disease), non obstructive on cath 2011 04/05/2012  . H/O syncope    Past Surgical History  Procedure Laterality Date  . Appendectomy    . Patent ductus arterious repair      at age 51  . Colonoscopy w/ polypectomy  03/17/11    diminutive polyp  . Cardiac catheterization  2007    Clean Cardiac Cath [facility] Cards), with RCA 30% narrowing likely due to catheter induced spasm  in 09/02/10.  . Back surgery    . Cardiac catheterization  09/02/2010    mod. nonobstructive disease in the RCA and CX, tortuous LAD  . Nm myocar perf wall motion  01/25/2008    mild anteroapical wall ischemia  . Loop recorder implant  04/06/2012    Reveal XT 4529  . Loop recorder implant N/A 04/06/2012    Procedure: LOOP RECORDER IMPLANT;  Surgeon: Sanda Klein, MD;  Location: [place] CATH LAB;  Service: Cardiovascular;  Laterality: N/A;  . Loop recorder explant N/A 01/14/2014    Procedure: LOOP RECORDER EXPLANT;  Surgeon: Sanda Klein, MD;  Location: [place] CATH LAB;  Service: Cardiovascular;  Laterality: N/A;   Family History  Problem Relation Age of Onset  . Colon cancer Paternal Grandfather   . Prostate cancer Paternal Grandfather   . Aneurysm Father   . Heart attack Father 17  . Coronary artery disease Father   . Colon cancer Paternal Uncle     x 2  . Colon polyps Mother   . Heart disease Mother   . Coronary artery disease Mother   . Migraines Mother   . Breast cancer Maternal Grandmother   . Stomach cancer Brother   . Liver disease      unsure who it was  . Allergies Daughter    Social History  Substance Use Topics  . Smoking status: Never Smoker   . Smokeless tobacco: Never Used  .  Alcohol Use: Yes     Comment: rare    Review of Systems  Constitutional: Positive for diaphoresis. Negative for fever.  Cardiovascular: Negative for chest pain.  Gastrointestinal: Positive for nausea, vomiting and abdominal pain.  Neurological: Positive for dizziness. Negative for syncope.  All other systems reviewed and are negative.  Allergies  Benadryl and Red blood cells  Home Medications   Prior to Admission medications   Medication Sig Start Date End Date Taking? Authorizing Provider  albuterol (PROVENTIL) (2.5 MG/3ML) 0.083% nebulizer solution USE 1 VIAL VIA NEBULIZER  EVERY 6 HOURS AS NEEDED FOR WHEEZING OR SHORTNESS OF  BREATH 08/10/15  Yes [name], NP  aspirin 325  MG tablet Take 325 mg by mouth daily.   Yes Historical Provider, MD  benzonatate (TESSALON) 100 MG capsule Take 100 mg by mouth 3 (three) times daily as needed for cough.   Yes Historical Provider, MD  cetirizine (ZYRTEC) 10 MG tablet Take 10 mg by mouth daily.   Yes Historical Provider, MD  clonazePAM (KLONOPIN) 1 MG tablet Take 1 tablet (1 mg total) by mouth daily. 10/06/15  Yes [name], NP  diclofenac sodium (VOLTAREN) 1 % GEL Apply 2 g topically 4 (four) times daily. 11/01/13  Yes Charlett Blake, MD  fenofibrate (TRICOR) 145 MG tablet Take 1 tablet by mouth  daily 08/10/15  Yes [name], NP  fish oil-omega-3 fatty acids 1000 MG capsule Take 2 g by mouth daily.    Yes Historical Provider, MD  Fluticasone-Salmeterol (ADVAIR) 100-50 MCG/DOSE AEPB Inhale 1 puff into the lungs 2 (two) times daily. 11/11/15  Yes [name], NP  gabapentin (NEURONTIN) 300 MG capsule TAKE 1 CAPSULE BY MOUTH AT  6AM, 11:30AM, 4:30PM AND  TAKE 3 CAPSULES AT 10PM. 08/10/15  Yes [name], NP  ketoconazole (NIZORAL) 2 % shampoo Apply 1 application  topically daily as directed 08/10/15  Yes [name], NP  lansoprazole (PREVACID) 15 MG capsule Take 15 mg by mouth daily.   Yes Historical Provider, MD  meloxicam (MOBIC) 7.5 MG tablet Take 1 tablet (7.5 mg total) by mouth daily. 10/06/15  Yes [name], NP  metFORMIN (GLUCOPHAGE-XR) 500 MG 24 hr tablet Take 2 tablet by mouth  daily with breakfast 11/11/15  Yes [name], NP  montelukast (SINGULAIR) 10 MG tablet Take 1 tablet by mouth at  bedtime 08/10/15  Yes [name], NP  Multiple Vitamin (MULTIVITAMIN WITH MINERALS) TABS tablet Take 1 tablet by mouth daily.   Yes Historical Provider, MD  naproxen sodium (ALEVE) 220 MG tablet Take 440 mg by mouth 2 (two) times daily. Take 440 mg in the morning and 220 mg in the evening.   Yes Historical Provider, MD  nitroGLYCERIN (NITROSTAT) 0.4 MG SL tablet Place 1  tablet (0.4 mg total) under the tongue every 5 (five) minutes as needed for chest pain. 11/11/15  Yes [name], NP  ONETOUCH VERIO test strip Check blood sugar 3 times  daily 10/09/15  Yes [name], NP  ramelteon (ROZEREM) 8 MG tablet Take 1 tablet (8 mg total) by mouth at bedtime. 10/15/15  Yes [name], NP  rosuvastatin (CRESTOR) 40 MG tablet Take 1 tablet (40 mg total) by mouth daily. 10/13/14  Yes [name], NP  sertraline (ZOLOFT) 50 MG tablet Take 1 tablet (50 mg total) by mouth daily. 10/06/15  Yes [name], NP  tamsulosin (FLOMAX) 0.4 MG CAPS capsule Take 1 capsule by mouth  daily 08/10/15  Yes [name], NP  tiZANidine (ZANAFLEX) 2 MG tablet Take 1 tablet (2 mg total) by mouth every 6 (six) hours as needed for muscle spasms. 10/19/15  Yes Charlett Blake, MD  traMADol (ULTRAM) 50 MG tablet Take 50 mg by mouth every 6 (six) hours as needed for severe pain.   Yes Historical Provider, MD  traZODone (DESYREL) 100 MG tablet Take 1 tablet by mouth at  bedtime 08/10/15  Yes [name], NP  zolpidem (AMBIEN) 10 MG tablet Take 1 tablet (10 mg total) by mouth at bedtime as needed for sleep. 11/11/15  Yes [name], NP  loperamide (IMODIUM) 2 MG capsule Take 1 capsule (2 mg total) by mouth as needed for diarrhea or loose stools. 01/20/16   Merryl Hacker, MD  ondansetron (ZOFRAN ODT) 4 MG disintegrating tablet Take 1 tablet (4 mg total) by mouth every 8 (eight) hours as needed for nausea or vomiting. 01/20/16   Merryl Hacker, MD   BP 137/92 mmHg  Pulse 79  Temp(Src) 98.2 F (36.8 C) (Oral)  Resp 14  Ht 5\' 8"  (1.727 m)  Wt 217 lb (98.431 kg)  BMI 33.00 kg/m2  SpO2 98% Physical Exam  Constitutional: He is oriented to person, place, and time. He appears well-developed and well-nourished. No distress.  HENT:  Head: Normocephalic and atraumatic.  Cardiovascular: Normal rate, regular rhythm and normal heart sounds.   No  murmur heard. Pulmonary/Chest: Effort normal and breath sounds normal. No respiratory distress. He has no wheezes.  Abdominal: Soft. Bowel sounds are normal. There is tenderness. There is no rebound.  Mild epigastric tenderness palpation without rebound or guarding  Musculoskeletal: He exhibits no edema.  Neurological: He is alert and oriented to person, place, and time.  Skin: Skin is warm and dry.  Psychiatric: He has a normal mood and affect.  Nursing note and vitals reviewed.   ED Course  Procedures  DIAGNOSTIC STUDIES:  Oxygen Saturation is 97% on RA, normal by my interpretation.    COORDINATION OF CARE:  1:33 AM Will order x-ray of abdomen and chest, CBC, CMP, lipase and troponin. Will administer Protonix and Zofran. Discussed treatment plan with pt at bedside and pt agreed to plan.  Labs Review Labs Reviewed  CBC WITH DIFFERENTIAL/PLATELET - Abnormal; Notable for the following:    RDW 15.8 (*)    All other components within normal limits  COMPREHENSIVE METABOLIC PANEL - Abnormal; Notable for the following:    Glucose, Bld 174 (*)    AST 43 (*)    All other components within normal limits  LIPASE, BLOOD  I-STAT TROPOININ, ED    Imaging Review Dg Abd Acute W/chest  01/20/2016  CLINICAL DATA:  Mid abdominal pain, nausea, and Mid chest pain tonight. EXAM: DG ABDOMEN ACUTE W/ 1V CHEST COMPARISON:  Chest 01/20/2015 FINDINGS: Normal heart size and pulmonary vascularity. No focal airspace disease or consolidation in the lungs. No blunting of costophrenic angles. No pneumothorax. Mediastinal contours appear intact. Old left rib fractures. Scattered gas and stool in the colon. No small or large bowel distention. No free intra-abdominal air. No abnormal air-fluid levels. No radiopaque stones. Visualized bones appear intact. IMPRESSION: No evidence of active pulmonary disease. Normal nonobstructive bowel gas pattern. Electronically Signed   By: Lucienne Capers M.D.   On: 01/20/2016  02:11   I have personally reviewed and evaluated these images and lab results as part of my medical decision-making.   EKG Interpretation   Date/Time:  Wednesday Jan 20 2016 00:40:01 EDT Ventricular  Rate:  88 PR Interval:  140 QRS Duration: 109 QT Interval:  357 QTC Calculation: 432 R Axis:   165 Text Interpretation:  Sinus rhythm Probable right ventricular hypertrophy  ST elev, probable normal early repol pattern No significant change since  last tracing Confirmed by HORTON  MD, COURTNEY ([id number]) on 01/20/2016 2:54:51  AM      MDM   Final diagnoses:  Vomiting and diarrhea  Dizziness    Patient presents with dizziness, nausea, vomiting. This woke him up from sleep. Feels this may be related to his blood pressure. Blood pressure initially 147/95. He is otherwise nontoxic. No chest pain. EKG is reassuring and unchanged from prior. He does have a monitoring device placed by his cardiologist. Evidence of mild orthostasis. Patient was given fluids. Monitor emergency room, he developed multiple episodes of nonbloody diarrhea. This in conjunction with the dizziness and nausea, vomiting may be early viral illness versus gastritis. Basic labwork obtained and reassuring. Troponin negative. Doubt cardiac etiology. Patient is ambulatory independently. He states that he feels much better after fluids. He needs to follow-up with his primary physician and cardiologist.  After history, exam, and medical workup I feel the patient has been appropriately medically screened and is safe for discharge home. Pertinent diagnoses were discussed with the patient. Patient was given return precautions.  I personally performed the services described in this documentation, which was scribed in my presence. The recorded information has been reviewed and is accurate.     Merryl Hacker, MD 01/20/16 [phone]

## 2016-01-22 ENCOUNTER — Emergency Department (HOSPITAL_COMMUNITY)
Admission: EM | Admit: 2016-01-22 | Discharge: 2016-01-22 | Disposition: A | Discharge status: Home / Self Care | Payer: Medicare Other | Attending: Emergency Medicine | Admitting: Emergency Medicine

## 2016-01-22 ENCOUNTER — Encounter (HOSPITAL_COMMUNITY): Payer: Self-pay | Admitting: *Deleted

## 2016-01-22 ENCOUNTER — Emergency Department (HOSPITAL_COMMUNITY): Payer: Medicare Other

## 2016-01-22 DIAGNOSIS — N400 Enlarged prostate without lower urinary tract symptoms: Secondary | ICD-10-CM | POA: Diagnosis not present

## 2016-01-22 DIAGNOSIS — Z7982 Long term (current) use of aspirin: Secondary | ICD-10-CM | POA: Diagnosis not present

## 2016-01-22 DIAGNOSIS — E785 Hyperlipidemia, unspecified: Secondary | ICD-10-CM | POA: Diagnosis not present

## 2016-01-22 DIAGNOSIS — E119 Type 2 diabetes mellitus without complications: Secondary | ICD-10-CM | POA: Insufficient documentation

## 2016-01-22 DIAGNOSIS — K219 Gastro-esophageal reflux disease without esophagitis: Secondary | ICD-10-CM | POA: Insufficient documentation

## 2016-01-22 DIAGNOSIS — I10 Essential (primary) hypertension: Secondary | ICD-10-CM

## 2016-01-22 DIAGNOSIS — J45909 Unspecified asthma, uncomplicated: Secondary | ICD-10-CM | POA: Insufficient documentation

## 2016-01-22 DIAGNOSIS — Z791 Long term (current) use of non-steroidal anti-inflammatories (NSAID): Secondary | ICD-10-CM | POA: Insufficient documentation

## 2016-01-22 DIAGNOSIS — I2510 Atherosclerotic heart disease of native coronary artery without angina pectoris: Secondary | ICD-10-CM | POA: Insufficient documentation

## 2016-01-22 DIAGNOSIS — Z9889 Other specified postprocedural states: Secondary | ICD-10-CM | POA: Insufficient documentation

## 2016-01-22 DIAGNOSIS — Z7984 Long term (current) use of oral hypoglycemic drugs: Secondary | ICD-10-CM | POA: Insufficient documentation

## 2016-01-22 DIAGNOSIS — Z79899 Other long term (current) drug therapy: Secondary | ICD-10-CM | POA: Diagnosis not present

## 2016-01-22 DIAGNOSIS — Z7951 Long term (current) use of inhaled steroids: Secondary | ICD-10-CM | POA: Insufficient documentation

## 2016-01-22 NOTE — ED Provider Notes (Signed)
CSN: [id number]     Arrival date & time 01/22/16  2049 History   First MD Initiated Contact with Patient 01/22/16 2135     Chief Complaint  Patient presents with  . Hypertension     (Consider location/radiation/quality/duration/timing/severity/associated sxs/prior Treatment) HPI  Pt presenting with c/o elevated blood pressure.  He was seen in the ED 4 days for similar issue.  He was instructed by his doctor to start 2.5mg  of lisinopril- he took that yesterday and today and was concerned as blood pressure remained elevated.  He had been on lisinopril in the past and BP began running low so this was discontinued over 1 year ago.  No chest pain, no shortness of breath, no changes in vision or speech.  He did feel some lightheadedness and blood pressure remained elevated so presented to the ED for recheck.  There are no other associated systemic symptoms, there are no other alleviating or modifying factors.   Past Medical History  Diagnosis Date  . Diabetes mellitus   . Asthma   . Enlarged prostate   . Hyperlipidemia   . Heart disease   . Hypertension   . Blood transfusion   . GERD (gastroesophageal reflux disease)   . Refusal of blood transfusions as patient is Jehovah's Witness   . Neuromuscular disorder (HCC)     DJD  . Coronary artery disease   . CAD (coronary artery disease), non obstructive on cath 2011 04/05/2012  . H/O syncope    Past Surgical History  Procedure Laterality Date  . Appendectomy    . Patent ductus arterious repair      at age 55  . Colonoscopy w/ polypectomy  03/17/11    diminutive polyp  . Cardiac catheterization  2007    Clean Cardiac Cath [facility] Cards), with RCA 30% narrowing likely due to catheter induced spasm in 09/02/10.  . Back surgery    . Cardiac catheterization  09/02/2010    mod. nonobstructive disease in the RCA and CX, tortuous LAD  . Nm myocar perf wall motion  01/25/2008    mild anteroapical wall ischemia  . Loop recorder implant   04/06/2012    Reveal XT 4529  . Loop recorder implant N/A 04/06/2012    Procedure: LOOP RECORDER IMPLANT;  Surgeon: Sanda Klein, MD;  Location: [place] CATH LAB;  Service: Cardiovascular;  Laterality: N/A;  . Loop recorder explant N/A 01/14/2014    Procedure: LOOP RECORDER EXPLANT;  Surgeon: Sanda Klein, MD;  Location: [place] CATH LAB;  Service: Cardiovascular;  Laterality: N/A;   Family History  Problem Relation Age of Onset  . Colon cancer Paternal Grandfather   . Prostate cancer Paternal Grandfather   . Aneurysm Father   . Heart attack Father 8  . Coronary artery disease Father   . Colon cancer Paternal Uncle     x 2  . Colon polyps Mother   . Heart disease Mother   . Coronary artery disease Mother   . Migraines Mother   . Breast cancer Maternal Grandmother   . Stomach cancer Brother   . Liver disease      unsure who it was  . Allergies Daughter    Social History  Substance Use Topics  . Smoking status: Never Smoker   . Smokeless tobacco: Never Used  . Alcohol Use: Yes     Comment: rare    Review of Systems  ROS reviewed and all otherwise negative except for mentioned in HPI    Allergies  Benadryl  and Red blood cells  Home Medications   Prior to Admission medications   Medication Sig Start Date End Date Taking? Authorizing Provider  albuterol (PROVENTIL) (2.5 MG/3ML) 0.083% nebulizer solution USE 1 VIAL VIA NEBULIZER  EVERY 6 HOURS AS NEEDED FOR WHEEZING OR SHORTNESS OF  BREATH 08/10/15  Yes [name], NP  aspirin 325 MG tablet Take 325 mg by mouth daily.   Yes Historical Provider, MD  benzonatate (TESSALON) 100 MG capsule Take 100 mg by mouth 3 (three) times daily as needed for cough.   Yes Historical Provider, MD  cetirizine (ZYRTEC) 10 MG tablet Take 10 mg by mouth daily.   Yes Historical Provider, MD  clonazePAM (KLONOPIN) 1 MG tablet Take 1 tablet (1 mg total) by mouth daily. 10/06/15  Yes [name], NP  diclofenac sodium (VOLTAREN) 1 % GEL Apply  2 g topically 4 (four) times daily. 11/01/13  Yes Charlett Blake, MD  fenofibrate (TRICOR) 145 MG tablet Take 1 tablet by mouth  daily 08/10/15  Yes [name], NP  fish oil-omega-3 fatty acids 1000 MG capsule Take 2 g by mouth daily.    Yes Historical Provider, MD  Fluticasone-Salmeterol (ADVAIR) 100-50 MCG/DOSE AEPB Inhale 1 puff into the lungs 2 (two) times daily. 11/11/15  Yes [name], NP  gabapentin (NEURONTIN) 300 MG capsule TAKE 1 CAPSULE BY MOUTH AT  6AM, 11:30AM, 4:30PM AND  TAKE 3 CAPSULES AT 10PM. 08/10/15  Yes [name], NP  ketoconazole (NIZORAL) 2 % shampoo Apply 1 application  topically daily as directed 08/10/15  Yes [name], NP  lansoprazole (PREVACID) 15 MG capsule Take 15 mg by mouth daily.   Yes Historical Provider, MD  loperamide (IMODIUM) 2 MG capsule Take 1 capsule (2 mg total) by mouth as needed for diarrhea or loose stools. 01/20/16  Yes Merryl Hacker, MD  meloxicam (MOBIC) 7.5 MG tablet Take 1 tablet (7.5 mg total) by mouth daily. 10/06/15  Yes [name], NP  metFORMIN (GLUCOPHAGE-XR) 500 MG 24 hr tablet Take 2 tablet by mouth  daily with breakfast 11/11/15  Yes [name], NP  montelukast (SINGULAIR) 10 MG tablet Take 1 tablet by mouth at  bedtime 08/10/15  Yes [name], NP  Multiple Vitamin (MULTIVITAMIN WITH MINERALS) TABS tablet Take 1 tablet by mouth daily.   Yes Historical Provider, MD  naproxen sodium (ALEVE) 220 MG tablet Take 440 mg by mouth 2 (two) times daily. Take 440 mg in the morning and 220 mg in the evening.   Yes Historical Provider, MD  nitroGLYCERIN (NITROSTAT) 0.4 MG SL tablet Place 1 tablet (0.4 mg total) under the tongue every 5 (five) minutes as needed for chest pain. 11/11/15  Yes [name], NP  ondansetron (ZOFRAN ODT) 4 MG disintegrating tablet Take 1 tablet (4 mg total) by mouth every 8 (eight) hours as needed for nausea or vomiting. 01/20/16  Yes Merryl Hacker, MD   ramelteon (ROZEREM) 8 MG tablet Take 1 tablet (8 mg total) by mouth at bedtime. 10/15/15  Yes [name], NP  rosuvastatin (CRESTOR) 40 MG tablet Take 1 tablet (40 mg total) by mouth daily. 10/13/14  Yes [name], NP  sertraline (ZOLOFT) 50 MG tablet Take 1 tablet (50 mg total) by mouth daily. 10/06/15  Yes [name], NP  tamsulosin (FLOMAX) 0.4 MG CAPS capsule Take 1 capsule by mouth  daily 08/10/15  Yes [name], NP  tiZANidine (ZANAFLEX) 2 MG tablet Take 1 tablet (2 mg total) by mouth every 6 (  six) hours as needed for muscle spasms. 10/19/15  Yes Charlett Blake, MD  traMADol (ULTRAM) 50 MG tablet Take 50 mg by mouth every 6 (six) hours as needed for severe pain.   Yes Historical Provider, MD  traZODone (DESYREL) 100 MG tablet Take 1 tablet by mouth at  bedtime 08/10/15  Yes [name], NP  zolpidem (AMBIEN) 10 MG tablet Take 1 tablet (10 mg total) by mouth at bedtime as needed for sleep. 11/11/15  Yes [name], NP  ONETOUCH VERIO test strip Check blood sugar 3 times  daily 10/09/15   [name], NP   BP 138/89 mmHg  Pulse 86  Temp(Src) 98.1 F (36.7 C) (Oral)  Resp 21  Wt 91.4 kg  SpO2 99%  Vitals reviewed Physical Exam  Physical Examination: General appearance - alert, well appearing, and in no distress Mental status - alert, oriented to person, place, and time Eyes - no conjunctival injection, no scleral icterus Mouth - mucous membranes moist, pharynx normal without lesions Chest - clear to auscultation, no wheezes, rales or rhonchi, symmetric air entry Heart - normal rate, regular rhythm, normal S1, S2, no murmurs, rubs, clicks or gallops Abdomen - soft, nontender, nondistended, no masses or organomegaly Neurological - alert, oriented x 3, no cranial nerve defect, strength 5/5 in extremities x 4, sensation intact Extremities - peripheral pulses normal, no pedal edema, no clubbing or cyanosis Skin - normal coloration and  turgor, no rashes  ED Course  Procedures (including critical care time) Labs Review Labs Reviewed - No data to display  Imaging Review Dg Chest 2 View  01/22/2016  CLINICAL DATA:  Midsternal chest pain and interscapular pain. Dyspnea. Hypertension. EXAM: CHEST  2 VIEW COMPARISON:  01/20/2016 FINDINGS: The lungs are clear. The pulmonary vasculature is normal. Heart size is normal. Hilar and mediastinal contours are unremarkable. There is no pleural effusion. IMPRESSION: No active cardiopulmonary disease. Electronically Signed   By: Andreas Newport M.D.   On: 01/22/2016 21:26   I have personally reviewed and evaluated these images and lab results as part of my medical decision-making.   EKG Interpretation   Date/Time:  Friday Jan 22 2016 21:58:38 EDT Ventricular Rate:  81 PR Interval:  141 QRS Duration: 111 QT Interval:  367 QTC Calculation: 426 R Axis:   -134 Text Interpretation:  Sinus rhythm Consider right ventricular hypertrophy  Borderline ST elevation, c/w early repolarization No significant change  since last tracing Confirmed by Canary Brim  MD, Gos[place] [phone]) on 01/22/2016  10:02:32 PM Also confirmed by Canary Brim  MD, [place] [phone]), editor [place] CT,  Leda Gauze [phone])  on 01/23/2016 8:01:01 AM      MDM   Final diagnoses:  Essential hypertension    Pt presenting with concern for ongoing hypertension this week.  He was seen in the ED earlier this week and had labs which showed no signs of end organ damage.  No focal signs of stroke, no chest pain or difficulty breathing.  EKG without any changes.  Pt has been started 2 days ago by PMD on very low dose of lisinopril 2.5mg - I advised him to increase this to 5mg  po qD and f/u with PMD.  Long discussion about nature of hyertension treatment- and importance of followup appointment.  Discharged with strict return precautions.  Pt agreeable with plan.    Alfonzo Beers, MD 01/23/16 [phone]

## 2016-01-22 NOTE — Discharge Instructions (Signed)
Return to the ED with any concerns including difficulty breathing, changes in vision or speech, weakness of arms or legs, chest pain, decreased level of alertness/lethargy, or any other alarming symptoms

## 2016-01-22 NOTE — ED Notes (Signed)
The pt is here for high bp.  He was here Tuesday for the same  He was given bp med but at night his bp increases.  Headache   Nothing taken for his headache  He took a 81 mg aspirin

## 2016-01-22 NOTE — ED Notes (Signed)
MD at bedside. 

## 2016-01-25 ENCOUNTER — Telehealth: Payer: Self-pay | Admitting: Family

## 2016-01-25 ENCOUNTER — Ambulatory Visit: Payer: Medicare Other | Admitting: Family

## 2016-01-25 NOTE — Telephone Encounter (Signed)
Noted, do not charge.  

## 2016-01-25 NOTE — Telephone Encounter (Signed)
Pt lvm at 8:44 stating that he had death in the family and is unable to make his appt with provider today.

## 2016-02-03 ENCOUNTER — Other Ambulatory Visit: Payer: Self-pay | Admitting: Family

## 2016-02-03 NOTE — Telephone Encounter (Signed)
Notified pt of below and he voices understanding. States he has enough of lisinopril 2.5mg  on hand to take 10mg  (4 tabs) until he can see PCP for follow up on 02/08/16 at 8:30am. Refills sent. Will wait until follow up to send Lisinopril.

## 2016-02-03 NOTE — Telephone Encounter (Signed)
Received refill requests from [facility] for: Trazodone, gabapentin, singulair, metformin and lisinopril.  Metformin request was for one tablet daily but current med list states 2 tablets daily. Lisinopril is not on current med list. Spoke with pt and he states that he is taking 2 metformin daily and Lisinopril 2.5mg  daily was started after his recent ER visit for elevated BP. Pt states BP still elevated, [id number] systolic. Advised pt he needs ER follow up and he states he is out of town for a funeral x 1 week.  Spoke with PCP and received verbal to take lisinopril 10mg  daily until he can return for ER follow up.

## 2016-02-08 ENCOUNTER — Ambulatory Visit (INDEPENDENT_AMBULATORY_CARE_PROVIDER_SITE_OTHER): Payer: Medicare Other | Admitting: Family

## 2016-02-08 ENCOUNTER — Encounter: Payer: Self-pay | Admitting: Family

## 2016-02-08 VITALS — BP 105/72 | HR 86 | Temp 98.7°F | Resp 18 | Ht 68.0 in | Wt 216.5 lb

## 2016-02-08 DIAGNOSIS — I10 Essential (primary) hypertension: Secondary | ICD-10-CM | POA: Diagnosis not present

## 2016-02-08 LAB — BASIC METABOLIC PANEL
BUN: 14 mg/dL (ref 6–23)
CHLORIDE: 103 meq/L (ref 96–112)
CO2: 28 mEq/L (ref 19–32)
CREATININE: 0.96 mg/dL (ref 0.40–1.50)
Calcium: 9.3 mg/dL (ref 8.4–10.5)
GFR: 89.93 mL/min (ref >60.00)
Glucose, Bld: 104 mg/dL — ABNORMAL HIGH (ref 70–99)
POTASSIUM: 4 meq/L (ref 3.5–5.1)
Sodium: 137 mEq/L (ref 135–145)

## 2016-02-08 MED ORDER — LISINOPRIL 5 MG PO TABS: 5.0000 mg | ORAL_TABLET | Freq: Every day | ORAL | Status: DC

## 2016-02-08 NOTE — Progress Notes (Signed)
Subjective:    Patient ID: Cody Raymond, male    DOB: 02/26/1971, 45 y.o.   MRN: [id number]  HPI  [name] is a 45 yr old male who presents today for ER follow up of HTN. ER record is reviewed.  Has been on 10mg  once daily.  Feeling much better now that his blood pressure is improved. Denies CP/SOB or swelling   BP Readings from Last 3 Encounters:  02/08/16 105/72  01/22/16 138/89  01/20/16 137/92    Review of Systems See HPI  Past Medical History  Diagnosis Date  . Diabetes mellitus   . Asthma   . Enlarged prostate   . Hyperlipidemia   . Heart disease   . Hypertension   . Blood transfusion   . GERD (gastroesophageal reflux disease)   . Refusal of blood transfusions as patient is Jehovah's Witness   . Neuromuscular disorder (HCC)     DJD  . Coronary artery disease   . CAD (coronary artery disease), non obstructive on cath 2011 04/05/2012  . H/O syncope      Social History   Social History  . Marital Status: Married    Spouse Name: N/A  . Number of Children: N/A  . Years of Education: N/A   Occupational History  . Not on file.   Social History Main Topics  . Smoking status: Never Smoker   . Smokeless tobacco: Never Used  . Alcohol Use: Yes     Comment: rare  . Drug Use: No  . Sexual Activity: Not on file   Other Topics Concern  . Not on file   Social History Narrative   Holter monitor 08/2010: PVCs and sinus tachy.   Sleep Study (02/2008): mild sleep apnea, no indication for CPAP.    Past Surgical History  Procedure Laterality Date  . Appendectomy    . Patent ductus arterious repair      at age 51  . Colonoscopy w/ polypectomy  03/17/11    diminutive polyp  . Cardiac catheterization  2007    Clean Cardiac Cath [facility] Cards), with RCA 30% narrowing likely due to catheter induced spasm in 09/02/10.  . Back surgery    . Cardiac catheterization  09/02/2010    mod. nonobstructive disease in the RCA and CX, tortuous LAD  . Nm myocar perf  wall motion  01/25/2008    mild anteroapical wall ischemia  . Loop recorder implant  04/06/2012    Reveal XT 4529  . Loop recorder implant N/A 04/06/2012    Procedure: LOOP RECORDER IMPLANT;  Surgeon: Sanda Klein, MD;  Location: [place] CATH LAB;  Service: Cardiovascular;  Laterality: N/A;  . Loop recorder explant N/A 01/14/2014    Procedure: LOOP RECORDER EXPLANT;  Surgeon: Sanda Klein, MD;  Location: [place] CATH LAB;  Service: Cardiovascular;  Laterality: N/A;    Family History  Problem Relation Age of Onset  . Colon cancer Paternal Grandfather   . Prostate cancer Paternal Grandfather   . Aneurysm Father   . Heart attack Father 70  . Coronary artery disease Father   . Colon cancer Paternal Uncle     x 2  . Colon polyps Mother   . Heart disease Mother   . Coronary artery disease Mother   . Migraines Mother   . Breast cancer Maternal Grandmother   . Stomach cancer Brother   . Liver disease      unsure who it was  . Allergies Daughter     Allergies  Allergen Reactions  . Benadryl [Diphenhydramine Hcl]     "drives me nuts"  . Red Blood Cells     PT. REFUSES ANY BLOOD PRODUCTS - [employer]EH[employer] WITNESS.    Current Outpatient Prescriptions on File Prior to Visit  Medication Sig Dispense Refill  . albuterol (PROVENTIL) (2.5 MG/3ML) 0.083% nebulizer solution USE 1 VIAL VIA NEBULIZER  EVERY 6 HOURS AS NEEDED FOR WHEEZING OR SHORTNESS OF  BREATH 600 mL 1  . aspirin 325 MG tablet Take 325 mg by mouth daily.    . cetirizine (ZYRTEC) 10 MG tablet Take 10 mg by mouth daily.    . clonazePAM (KLONOPIN) 1 MG tablet Take 1 tablet (1 mg total) by mouth daily. 30 tablet 0  . diclofenac sodium (VOLTAREN) 1 % GEL Apply 2 g topically 4 (four) times daily.    . fenofibrate (TRICOR) 145 MG tablet Take 1 tablet by mouth  daily 90 tablet 1  . fish oil-omega-3 fatty acids 1000 MG capsule Take 2 g by mouth daily.     . Fluticasone-Salmeterol (ADVAIR) 100-50 MCG/DOSE AEPB Inhale 1 puff into the lungs 2 (two)  times daily. 1 each 3  . gabapentin (NEURONTIN) 300 MG capsule Take 1 capsule by mouth at  6am, 11:30am and 4:30pm and take 3 capsules at 10:00pm 540 capsule 1  . ketoconazole (NIZORAL) 2 % shampoo Apply 1 application  topically daily as directed 360 mL 1  . lansoprazole (PREVACID) 15 MG capsule Take 15 mg by mouth daily.    Marland Kitchen lisinopril (PRINIVIL,ZESTRIL) 2.5 MG tablet Take 10 mg by mouth daily.    Marland Kitchen loperamide (IMODIUM) 2 MG capsule Take 1 capsule (2 mg total) by mouth as needed for diarrhea or loose stools. 12 capsule 0  . meloxicam (MOBIC) 7.5 MG tablet Take 1 tablet (7.5 mg total) by mouth daily. 14 tablet 0  . metFORMIN (GLUCOPHAGE-XR) 500 MG 24 hr tablet Take 2 tablets (1,000 mg total) by mouth daily with breakfast. 180 tablet 1  . montelukast (SINGULAIR) 10 MG tablet Take 1 tablet by mouth at  bedtime 90 tablet 1  . Multiple Vitamin (MULTIVITAMIN WITH MINERALS) TABS tablet Take 1 tablet by mouth daily.    . naproxen sodium (ALEVE) 220 MG tablet Take 440 mg by mouth 2 (two) times daily. Take 440 mg in the morning and 220 mg in the evening.    . nitroGLYCERIN (NITROSTAT) 0.4 MG SL tablet Place 1 tablet (0.4 mg total) under the tongue every 5 (five) minutes as needed for chest pain. 30 tablet 1  . ondansetron (ZOFRAN ODT) 4 MG disintegrating tablet Take 1 tablet (4 mg total) by mouth every 8 (eight) hours as needed for nausea or vomiting. 20 tablet 0  . ONETOUCH VERIO test strip Check blood sugar 3 times  daily 300 each 1  . ramelteon (ROZEREM) 8 MG tablet Take 1 tablet (8 mg total) by mouth at bedtime. 30 tablet 5  . rosuvastatin (CRESTOR) 40 MG tablet Take 1 tablet (40 mg total) by mouth daily. 90 tablet 3  . sertraline (ZOLOFT) 50 MG tablet Take 1 tablet (50 mg total) by mouth daily. 90 tablet 1  . tamsulosin (FLOMAX) 0.4 MG CAPS capsule Take 1 capsule by mouth  daily 90 capsule 1  . tiZANidine (ZANAFLEX) 2 MG tablet Take 1 tablet (2 mg total) by mouth every 6 (six) hours as needed for  muscle spasms. 90 tablet 1  . traMADol (ULTRAM) 50 MG tablet Take 50 mg by mouth every 6 (  six) hours as needed for severe pain.    . traZODone (DESYREL) 100 MG tablet Take 1 tablet by mouth at  bedtime 90 tablet 1  . zolpidem (AMBIEN) 10 MG tablet Take 1 tablet (10 mg total) by mouth at bedtime as needed for sleep. 30 tablet 0  . [DISCONTINUED] niacin (NIASPAN) 1000 MG CR tablet Take 1 tablet (1,000 mg total) by mouth at bedtime. 30 tablet 6   No current facility-administered medications on file prior to visit.    BP 105/72 mmHg  Pulse 86  Temp(Src) 98.7 F (37.1 C) (Oral)  Resp 18  Ht 5\' 8"  (1.727 m)  Wt 216 lb 8 oz (98.204 kg)  BMI 32.93 kg/m2  SpO2 100%       Objective:   Physical Exam  Constitutional: He is oriented to person, place, and time. He appears well-developed and well-nourished. No distress.  HENT:  Head: Normocephalic and atraumatic.  Cardiovascular: Normal rate and regular rhythm.   No murmur heard. Pulmonary/Chest: Effort normal and breath sounds normal. No respiratory distress. He has no wheezes. He has no rales.  Musculoskeletal: He exhibits no edema.  Neurological: He is alert and oriented to person, place, and time.  Skin: Skin is warm and dry.  Psychiatric: He has a normal mood and affect. His behavior is normal. Thought content normal.          Assessment & Plan:

## 2016-02-08 NOTE — Patient Instructions (Signed)
Please complete lab work prior to leaving. Decrease lisinopril to 5mg  once daily.

## 2016-02-08 NOTE — Assessment & Plan Note (Signed)
Appears overtreated. Will decrease lisinopril form 10mg  to 5mg . Obtain bmet, follow up in 1 month.

## 2016-02-09 ENCOUNTER — Ambulatory Visit: Payer: Self-pay | Admitting: Family

## 2016-02-12 ENCOUNTER — Ambulatory Visit (INDEPENDENT_AMBULATORY_CARE_PROVIDER_SITE_OTHER): Payer: Medicare Other | Admitting: Cardiovascular Disease

## 2016-02-12 ENCOUNTER — Encounter: Payer: Self-pay | Admitting: Cardiovascular Disease

## 2016-02-12 VITALS — BP 112/78 | HR 72 | Ht 68.0 in | Wt 214.4 lb

## 2016-02-12 DIAGNOSIS — Z8249 Family history of ischemic heart disease and other diseases of the circulatory system: Secondary | ICD-10-CM

## 2016-02-12 DIAGNOSIS — R072 Precordial pain: Secondary | ICD-10-CM | POA: Diagnosis not present

## 2016-02-12 DIAGNOSIS — I422 Other hypertrophic cardiomyopathy: Secondary | ICD-10-CM

## 2016-02-12 DIAGNOSIS — Z9889 Other specified postprocedural states: Secondary | ICD-10-CM

## 2016-02-12 DIAGNOSIS — I10 Essential (primary) hypertension: Secondary | ICD-10-CM | POA: Diagnosis not present

## 2016-02-12 DIAGNOSIS — M549 Dorsalgia, unspecified: Secondary | ICD-10-CM

## 2016-02-12 DIAGNOSIS — Z9189 Other specified personal risk factors, not elsewhere classified: Secondary | ICD-10-CM

## 2016-02-12 DIAGNOSIS — I2510 Atherosclerotic heart disease of native coronary artery without angina pectoris: Secondary | ICD-10-CM | POA: Diagnosis not present

## 2016-02-12 DIAGNOSIS — I4510 Unspecified right bundle-branch block: Secondary | ICD-10-CM

## 2016-02-12 DIAGNOSIS — Z87898 Personal history of other specified conditions: Secondary | ICD-10-CM

## 2016-02-12 DIAGNOSIS — Z8774 Personal history of (corrected) congenital malformations of heart and circulatory system: Secondary | ICD-10-CM

## 2016-02-12 DIAGNOSIS — R002 Palpitations: Secondary | ICD-10-CM

## 2016-02-12 DIAGNOSIS — G8929 Other chronic pain: Secondary | ICD-10-CM

## 2016-02-12 MED ORDER — LORAZEPAM 1 MG PO TABS: 1.0000 mg | ORAL_TABLET | Freq: Once | ORAL | Status: DC

## 2016-02-12 NOTE — Patient Instructions (Signed)
Dr Sallyanne Kuster recommends that you continue on your current medications as directed. Please refer to the Current Medication list given to you today.  Your physician has requested that you have a cardiac MRI. Cardiac MRI uses a computer to create images of your heart as its beating, producing both still and moving pictures of your heart and major blood vessels. For further information please visit [email or web address] Please follow the instruction sheet given to you today for more information.  Dr Sallyanne Kuster recommends that you schedule a follow-up appointment in 3 months.  If you need a refill on your cardiac medications before your next appointment, please call your pharmacy.

## 2016-02-12 NOTE — Progress Notes (Signed)
Patient ID: Cody Raymond, male   DOB: 1970-11-24, 45 y.o.   MRN: [id number]    Cardiology Office Note    Date:  02/13/2016   ID:  [name], DOB 1971-05-20, MRN [id number]  PCP:  Nance Pear., NP  Cardiologist: Virl Axe, M.D. (EP);  Sanda Klein, MD   Chief Complaint  Patient presents with  . Follow-up    5 weeks--event monitor  pt c/o occasional chest discomfort in center of chest, fluttering on left rib cage area, BP issues--has been elevated, good today    History of Present Illness:  Cody Raymond is a 45 y.o. male with a history of unexplained syncope, family history of hypertrophic cardiomyopathy, personal history of repaired patent ductus arteriosus, minor coronary atherosclerosis by previous cardiac catheterization, mild hypertension and hyperlipidemia and type 2 diabetes mellitus.  He is wearing an event monitor for palpitations that has not shown any meaningful arrhythmia yet, after almost a full month. He has not experienced syncope. He has an implantable loop recorder for 3 years that did not show any significant arrhythmia, but then reached end of service. He has yet to undergo the recommended MRI for clarification of diagnosis of hypertrophic cardiomyopathy, after seeing Dr. Caryl Comes in consultation. One of his electrocardiograms raised concern for possible Brugada pattern, but his ECG today no longer shows any ST segment deviation.  He continues to have sharp stinging fleeting chest pain in the left chest in the precordial area and around the scar of his previous chest surgery. The area is tender to touch and the location of the pain varies from episodes episode.  He was started on lisinopril for hypertension but the dose had to be reduced to 5 mg for symptomatic hypotension. His diabetes mellitus is reported as being well controlled (last Hgb A1c 6.4%). He takes high-dose rosuvastatin for hypercholesterolemia and fenofibrate for hypertriglyceridemia  (last LDL 55, triglycerides 471 before the fibrate was started.).  He had patent ductus arteriosus repair at age [age]. He had a cardiac catheterization 2011 that showed minor coronary atherosclerosis and raise some suspicion for coronary artery spasm. His mother has hypertrophic cardiomyopathy and has undergone a septal myectomy surgery in [place]. 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    Past Medical History  Diagnosis Date  . Diabetes mellitus   . Asthma   . Enlarged prostate   . Hyperlipidemia   . Heart disease   . Hypertension   . Blood transfusion   . GERD (gastroesophageal reflux disease)   . Refusal of blood transfusions as patient is Jehovah's Witness   . Neuromuscular disorder (HCC)     DJD  . Coronary artery disease   . CAD (coronary artery disease), non obstructive on cath 2011 04/05/2012  . H/O syncope     Past Surgical History  Procedure Laterality Date  . Appendectomy    . Patent ductus arterious repair      at age 22  . Colonoscopy w/ polypectomy  03/17/11    diminutive polyp  . Cardiac catheterization  2007    Clean Cardiac Cath [facility] Cards), with RCA 30% narrowing likely due to catheter induced spasm in 09/02/10.  . Back surgery    . Cardiac catheterization  09/02/2010    mod. nonobstructive disease in the RCA and CX, tortuous LAD  . Nm myocar perf wall motion  01/25/2008    mild anteroapical wall ischemia  . Loop recorder implant  04/06/2012    Reveal XT 4529  .  Loop recorder implant N/A 04/06/2012    Procedure: LOOP RECORDER IMPLANT;  Surgeon: Sanda Klein, MD;  Location: [place] CATH LAB;  Service: Cardiovascular;  Laterality: N/A;  . Loop recorder explant N/A 01/14/2014    Procedure: LOOP RECORDER EXPLANT;  Surgeon: Sanda Klein, MD;  Location: [place] CATH LAB;  Service: Cardiovascular;  Laterality: N/A;    Current Medications: Outpatient Prescriptions Prior to Visit  Medication Sig Dispense Refill  . albuterol  (PROVENTIL) (2.5 MG/3ML) 0.083% nebulizer solution USE 1 VIAL VIA NEBULIZER  EVERY 6 HOURS AS NEEDED FOR WHEEZING OR SHORTNESS OF  BREATH 600 mL 1  . aspirin 325 MG tablet Take 325 mg by mouth daily.    . cetirizine (ZYRTEC) 10 MG tablet Take 10 mg by mouth daily.    . clonazePAM (KLONOPIN) 1 MG tablet Take 1 tablet (1 mg total) by mouth daily. 30 tablet 0  . diclofenac sodium (VOLTAREN) 1 % GEL Apply 2 g topically 4 (four) times daily.    . fenofibrate (TRICOR) 145 MG tablet Take 1 tablet by mouth  daily 90 tablet 1  . fish oil-omega-3 fatty acids 1000 MG capsule Take 2 g by mouth daily.     . Fluticasone-Salmeterol (ADVAIR) 100-50 MCG/DOSE AEPB Inhale 1 puff into the lungs 2 (two) times daily. 1 each 3  . gabapentin (NEURONTIN) 300 MG capsule Take 1 capsule by mouth at  6am, 11:30am and 4:30pm and take 3 capsules at 10:00pm 540 capsule 1  . ketoconazole (NIZORAL) 2 % shampoo Apply 1 application  topically daily as directed 360 mL 1  . lansoprazole (PREVACID) 15 MG capsule Take 15 mg by mouth daily.    Marland Kitchen lisinopril (PRINIVIL,ZESTRIL) 5 MG tablet Take 1 tablet (5 mg total) by mouth daily. 90 tablet 0  . loperamide (IMODIUM) 2 MG capsule Take 1 capsule (2 mg total) by mouth as needed for diarrhea or loose stools. 12 capsule 0  . meloxicam (MOBIC) 7.5 MG tablet Take 1 tablet (7.5 mg total) by mouth daily. 14 tablet 0  . metFORMIN (GLUCOPHAGE-XR) 500 MG 24 hr tablet Take 2 tablets (1,000 mg total) by mouth daily with breakfast. 180 tablet 1  . montelukast (SINGULAIR) 10 MG tablet Take 1 tablet by mouth at  bedtime 90 tablet 1  . Multiple Vitamin (MULTIVITAMIN WITH MINERALS) TABS tablet Take 1 tablet by mouth daily.    . naproxen sodium (ALEVE) 220 MG tablet Take 440 mg by mouth 2 (two) times daily. Take 440 mg in the morning and 220 mg in the evening.    . nitroGLYCERIN (NITROSTAT) 0.4 MG SL tablet Place 1 tablet (0.4 mg total) under the tongue every 5 (five) minutes as needed for chest pain. 30  tablet 1  . ondansetron (ZOFRAN ODT) 4 MG disintegrating tablet Take 1 tablet (4 mg total) by mouth every 8 (eight) hours as needed for nausea or vomiting. 20 tablet 0  . ONETOUCH VERIO test strip Check blood sugar 3 times  daily 300 each 1  . ramelteon (ROZEREM) 8 MG tablet Take 1 tablet (8 mg total) by mouth at bedtime. 30 tablet 5  . rosuvastatin (CRESTOR) 40 MG tablet Take 1 tablet (40 mg total) by mouth daily. 90 tablet 3  . sertraline (ZOLOFT) 50 MG tablet Take 1 tablet (50 mg total) by mouth daily. 90 tablet 1  . tamsulosin (FLOMAX) 0.4 MG CAPS capsule Take 1 capsule by mouth  daily 90 capsule 1  . tiZANidine (ZANAFLEX) 2 MG tablet Take 1 tablet (2  mg total) by mouth every 6 (six) hours as needed for muscle spasms. 90 tablet 1  . traMADol (ULTRAM) 50 MG tablet Take 50 mg by mouth every 6 (six) hours as needed for severe pain.    . traZODone (DESYREL) 100 MG tablet Take 1 tablet by mouth at  bedtime 90 tablet 1  . zolpidem (AMBIEN) 10 MG tablet Take 1 tablet (10 mg total) by mouth at bedtime as needed for sleep. 30 tablet 0   No facility-administered medications prior to visit.     Allergies:   Benadryl and Red blood cells   Social History   Social History  . Marital Status: Married    Spouse Name: N/A  . Number of Children: N/A  . Years of Education: N/A   Social History Main Topics  . Smoking status: Never Smoker   . Smokeless tobacco: Never Used  . Alcohol Use: Yes     Comment: rare  . Drug Use: No  . Sexual Activity: Not Asked   Other Topics Concern  . None   Social History Narrative   Holter monitor 08/2010: PVCs and sinus tachy.   Sleep Study (02/2008): mild sleep apnea, no indication for CPAP.     Family History:  The patient's family history includes Allergies in his daughter; Aneurysm in his father; Breast cancer in his maternal grandmother; Colon cancer in his paternal grandfather and paternal uncle; Colon polyps in his mother; Coronary artery disease in his  father and mother; Heart attack (age of onset: 36) in his father; Heart disease in his mother; Migraines in his mother; Prostate cancer in his paternal grandfather; Stomach cancer in his brother.   ROS:   Please see the history of present illness.    ROS All other systems reviewed and are negative.   PHYSICAL EXAM:   VS:  BP 112/78 mmHg  Pulse 72  Ht 5\' 8"  (1.727 m)  Wt 97.251 kg (214 lb 6.4 oz)  BMI 32.61 kg/m2   GEN: Well nourished, well developed, in no acute distress HEENT: normal Neck: no JVD, carotid bruits, or masses Cardiac: RRR; no murmurs, rubs, or gallops,no edema , large left lateral thoracotomy scar with some keloid formation Respiratory:  clear to auscultation bilaterally, normal work of breathing GI: soft, nontender, nondistended, + BS MS: no deformity or atrophy Skin: warm and dry, no rash Neuro:  Alert and Oriented x 3, Strength and sensation are intact Psych: euthymic mood, full affect  Wt Readings from Last 3 Encounters:  02/12/16 97.251 kg (214 lb 6.4 oz)  02/08/16 98.204 kg (216 lb 8 oz)  01/22/16 91.4 kg (201 lb 8 oz)      Studies/Labs Reviewed:   EKG:  EKG is ordered today.  The ekg ordered today demonstrates Normal sinus rhythm, RSR prime pattern in lead V1 but with ST segments at isoelectric baseline. QTC 411 ms  Recent Labs: 01/08/2016: TSH 0.95 01/20/2016: ALT 53; Hemoglobin 13.5; Platelets 234 02/08/2016: BUN 14; Creatinine, Ser 0.96; Potassium 4.0; Sodium 137   Lipid Panel    Component Value Date/Time   CHOL 191 11/11/2015 1004   TRIG * 11/11/2015 1004    471.0 Triglyceride is over 400; calculations on Lipids are invalid.   HDL 33.70* 11/11/2015 1004   CHOLHDL 6 11/11/2015 1004   VLDL 36.4 04/17/2015 0940   LDLCALC 122* 04/17/2015 0940   LDLDIRECT 55.0 11/11/2015 1004    ASSESSMENT:    1. Precordial pain   2. Coronary artery disease involving native coronary  artery of native heart without angina pectoris   3. Essential hypertension     4. History of syncope   5. Right bundle branch block   6. Family history of hypertrophic cardiomyopathy   7. Palpitations   8. Chronic back pain   9. History of repair of patent ductus arteriosus      PLAN:  In order of problems listed above:  1. Chest pain: His chest pain is convincingly noncardiac and his chest wall related. It may be due to some degree of keloid scar formation.  2. CAD/cronary spasm: His current chest pain symptoms do not sound compatible with either fixed coronary artery disease or coronary vasospasm. The focus is on treatment of coronary risk factors which for the most part seems to be well achieved. He needs to focus on weight loss and more physical exercise (limited by his back pain) 3. HTN: His blood pressure is well treated 4. Syncope: He had one episode of syncope years ago that remains unexplained 5. RBBB: Current ECG not compatible with Brugada syndrome. MRI scheduled. 6. HCM?: Family history of hypertrophic cardiomyopathy. Plan MRI. 7. Palpitations:No significant rhythm abnormality detected on current event monitor, will reevaluate once the full recording is available. 8. Back pain: This is his major functional limiting factor 9. Hx PDA repair: Somebody raised the possibility that his PDA "could've grown back". I assured him that this is highly unlikely.    Medication Adjustments/Labs and Tests Ordered: Current medicines are reviewed at length with the patient today.  Concerns regarding medicines are outlined above.  Medication changes, Labs and Tests ordered today are listed in the Patient Instructions below. Patient Instructions  Dr Sallyanne Kuster recommends that you continue on your current medications as directed. Please refer to the Current Medication list given to you today.  Your physician has requested that you have a cardiac MRI. Cardiac MRI uses a computer to create images of your heart as its beating, producing both still and moving pictures of your  heart and major blood vessels. For further information please visit [email or web address] Please follow the instruction sheet given to you today for more information.  Dr Sallyanne Kuster recommends that you schedule a follow-up appointment in 3 months.  If you need a refill on your cardiac medications before your next appointment, please call your pharmacy.    Signed, Sanda Klein, MD  02/13/2016 10:13 AM    [address], [place], [place]  [address] Phone: [phone]; Fax: [phone]

## 2016-02-16 ENCOUNTER — Encounter: Payer: Self-pay | Admitting: Cardiovascular Disease

## 2016-02-24 ENCOUNTER — Other Ambulatory Visit (HOSPITAL_COMMUNITY): Payer: Self-pay

## 2016-02-25 ENCOUNTER — Encounter: Payer: Self-pay | Admitting: Family

## 2016-02-26 ENCOUNTER — Telehealth: Payer: Self-pay | Admitting: *Deleted

## 2016-02-26 ENCOUNTER — Telehealth: Payer: Self-pay

## 2016-02-26 ENCOUNTER — Encounter: Payer: Self-pay | Admitting: Cardiovascular Disease

## 2016-02-26 NOTE — Telephone Encounter (Signed)
Received fax from [facility], Dr Lutricia Feil stating pt is planned for: routine dental fillings, deep gum dental cleaning, root canal and dental tooth extractions. Wanted to verify if pt is required to premedicate. Letter completed by PCP and faxed to [phone].

## 2016-02-26 NOTE — Telephone Encounter (Signed)
Faxed requested information (medications and clearance letter) to Dr Rudene Anda at [facility] [phone].

## 2016-03-01 ENCOUNTER — Other Ambulatory Visit: Payer: Self-pay | Admitting: Cardiovascular Disease

## 2016-03-01 DIAGNOSIS — I422 Other hypertrophic cardiomyopathy: Secondary | ICD-10-CM

## 2016-03-02 ENCOUNTER — Ambulatory Visit (HOSPITAL_COMMUNITY): Admission: RE | Admit: 2016-03-02 | Payer: Medicare Other | Source: Ambulatory Visit

## 2016-03-09 ENCOUNTER — Encounter: Payer: Self-pay | Admitting: Family

## 2016-03-09 ENCOUNTER — Ambulatory Visit (INDEPENDENT_AMBULATORY_CARE_PROVIDER_SITE_OTHER): Payer: Medicare Other | Admitting: Family

## 2016-03-09 VITALS — BP 105/72 | HR 61 | Temp 98.6°F | Resp 18 | Ht 68.0 in | Wt 207.0 lb

## 2016-03-09 DIAGNOSIS — R300 Dysuria: Secondary | ICD-10-CM

## 2016-03-09 DIAGNOSIS — I10 Essential (primary) hypertension: Secondary | ICD-10-CM | POA: Diagnosis not present

## 2016-03-09 LAB — POCT URINALYSIS DIPSTICK
BILIRUBIN UA: NEGATIVE
Blood, UA: NEGATIVE
Glucose, UA: NEGATIVE
KETONES UA: NEGATIVE
LEUKOCYTES UA: NEGATIVE
Nitrite, UA: NEGATIVE
PH UA: 5
PROTEIN UA: NEGATIVE
Urobilinogen, UA: NEGATIVE

## 2016-03-09 NOTE — Addendum Note (Signed)
Addended by: Kelle Darting A on: 03/09/2016 10:30 AM   Modules accepted: Orders, SmartSet

## 2016-03-09 NOTE — Patient Instructions (Signed)
We will send your urine for culture and let you know if it shows infection.

## 2016-03-09 NOTE — Progress Notes (Signed)
Pre visit review using our clinic review tool, if applicable. No additional management support is needed unless otherwise documented below in the visit note. 

## 2016-03-09 NOTE — Progress Notes (Signed)
Subjective:    Patient ID: Cody Raymond, male    DOB: Apr 13, 1971, 45 y.o.   MRN: [id number]  HPI  Mr. Tester is a 45 yr old male who presents today for follow up of his hypertension. Last visit his BP was noted to be low and his lisinopril was decreased from 10mg  to 5mg . Reports BP last night 134/100, then down to 107/73. Feels ok, denies dizziness/light headedness.  BP Readings from Last 3 Encounters:  03/09/16 105/72  02/12/16 112/78  02/08/16 105/72    He does report a several day history of dysuria. Denies hematuria, fever, unusual low back pain. Does have some chronic back pain.   Review of Systems    see HPI  Past Medical History  Diagnosis Date  . Diabetes mellitus   . Asthma   . Enlarged prostate   . Hyperlipidemia   . Heart disease   . Hypertension   . Blood transfusion   . GERD (gastroesophageal reflux disease)   . Refusal of blood transfusions as patient is Jehovah's Witness   . Neuromuscular disorder (HCC)     DJD  . Coronary artery disease   . CAD (coronary artery disease), non obstructive on cath 2011 04/05/2012  . H/O syncope      Social History   Social History  . Marital Status: Married    Spouse Name: N/A  . Number of Children: N/A  . Years of Education: N/A   Occupational History  . Not on file.   Social History Main Topics  . Smoking status: Never Smoker   . Smokeless tobacco: Never Used  . Alcohol Use: Yes     Comment: rare  . Drug Use: No  . Sexual Activity: Not on file   Other Topics Concern  . Not on file   Social History Narrative   Holter monitor 08/2010: PVCs and sinus tachy.   Sleep Study (02/2008): mild sleep apnea, no indication for CPAP.    Past Surgical History  Procedure Laterality Date  . Appendectomy    . Patent ductus arterious repair      at age 55  . Colonoscopy w/ polypectomy  03/17/11    diminutive polyp  . Cardiac catheterization  2007    Clean Cardiac Cath [facility]), with RCA 30% narrowing  likely due to catheter induced spasm in 09/02/10.  . Back surgery    . Cardiac catheterization  09/02/2010    mod. nonobstructive disease in the RCA and CX, tortuous LAD  . Nm myocar perf wall motion  01/25/2008    mild anteroapical wall ischemia  . Loop recorder implant  04/06/2012    Reveal XT 4529  . Loop recorder implant N/A 04/06/2012    Procedure: LOOP RECORDER IMPLANT;  Surgeon: Sanda Klein, MD;  Location: [place] CATH [facility];  Service: Cardiovascular;  Laterality: N/A;  . Loop recorder explant N/A 01/14/2014    Procedure: LOOP RECORDER EXPLANT;  Surgeon: Sanda Klein, MD;  Location: [place] C[facility];  Service: Cardiovascular;  Laterality: N/A;    Family History  Problem Relation Age of Onset  . Colon cancer Paternal Grandfather   . Prostate cancer Paternal Grandfather   . Aneurysm Father   . Heart attack Father 67  . Coronary artery disease Father   . Colon cancer Paternal Uncle     x 2  . Colon polyps Mother   . Heart disease Mother   . Coronary artery disease Mother   . Migraines Mother   . Breast cancer  Maternal Grandmother   . Stomach cancer Brother   . Liver disease      unsure who it was  . Allergies Daughter     Allergies  Allergen Reactions  . Benadryl [Diphenhydramine Hcl]     "drives me nuts"  . Red Blood Cells     PT. REFUSES ANY BLOOD PRODUCTS - [employer] W[employer].    Current Outpatient Prescriptions on File Prior to Visit  Medication Sig Dispense Refill  . albuterol (PROVENTIL) (2.5 MG/3ML) 0.083% nebulizer solution USE 1 VIAL VIA NEBULIZER  EVERY 6 HOURS AS NEEDED FOR WHEEZING OR SHORTNESS OF  BREATH 600 mL 1  . aspirin 325 MG tablet Take 325 mg by mouth daily.    . cetirizine (ZYRTEC) 10 MG tablet Take 10 mg by mouth daily.    . clonazePAM (KLONOPIN) 1 MG tablet Take 1 tablet (1 mg total) by mouth daily. 30 tablet 0  . diclofenac sodium (VOLTAREN) 1 % GEL Apply 2 g topically 4 (four) times daily.    . fenofibrate (TRICOR) 145 MG tablet Take 1 tablet by mouth   daily 90 tablet 1  . fish oil-omega-3 fatty acids 1000 MG capsule Take 2 g by mouth daily.     . Fluticasone-Salmeterol (ADVAIR) 100-50 MCG/DOSE AEPB Inhale 1 puff into the lungs 2 (two) times daily. 1 each 3  . gabapentin (NEURONTIN) 300 MG capsule Take 1 capsule by mouth at  6am, 11:30am and 4:30pm and take 3 capsules at 10:00pm 540 capsule 1  . ketoconazole (NIZORAL) 2 % shampoo Apply 1 application  topically daily as directed 360 mL 1  . lansoprazole (PREVACID) 15 MG capsule Take 15 mg by mouth daily.    Marland Kitchen lisinopril (PRINIVIL,ZESTRIL) 5 MG tablet Take 1 tablet (5 mg total) by mouth daily. 90 tablet 0  . loperamide (IMODIUM) 2 MG capsule Take 1 capsule (2 mg total) by mouth as needed for diarrhea or loose stools. 12 capsule 0  . LORazepam (ATIVAN) 1 MG tablet Take 1 tablet (1 mg total) by mouth once. Take 1 tablet (1 mg total) by mouth once 30 minutes-1 hour prior to cardiac MRI. 1 tablet 0  . meloxicam (MOBIC) 7.5 MG tablet Take 1 tablet (7.5 mg total) by mouth daily. 14 tablet 0  . metFORMIN (GLUCOPHAGE-XR) 500 MG 24 hr tablet Take 2 tablets (1,000 mg total) by mouth daily with breakfast. 180 tablet 1  . montelukast (SINGULAIR) 10 MG tablet Take 1 tablet by mouth at  bedtime 90 tablet 1  . Multiple Vitamin (MULTIVITAMIN WITH MINERALS) TABS tablet Take 1 tablet by mouth daily.    . naproxen sodium (ALEVE) 220 MG tablet Take 440 mg by mouth 2 (two) times daily. Take 440 mg in the morning and 220 mg in the evening.    . nitroGLYCERIN (NITROSTAT) 0.4 MG SL tablet Place 1 tablet (0.4 mg total) under the tongue every 5 (five) minutes as needed for chest pain. 30 tablet 1  . ondansetron (ZOFRAN ODT) 4 MG disintegrating tablet Take 1 tablet (4 mg total) by mouth every 8 (eight) hours as needed for nausea or vomiting. 20 tablet 0  . ONETOUCH VERIO test strip Check blood sugar 3 times  daily 300 each 1  . ramelteon (ROZEREM) 8 MG tablet Take 1 tablet (8 mg total) by mouth at bedtime. 30 tablet 5  .  rosuvastatin (CRESTOR) 40 MG tablet Take 1 tablet (40 mg total) by mouth daily. 90 tablet 3  . sertraline (ZOLOFT) 50 MG tablet  Take 1 tablet (50 mg total) by mouth daily. 90 tablet 1  . tamsulosin (FLOMAX) 0.4 MG CAPS capsule Take 1 capsule by mouth  daily 90 capsule 1  . tiZANidine (ZANAFLEX) 2 MG tablet Take 1 tablet (2 mg total) by mouth every 6 (six) hours as needed for muscle spasms. 90 tablet 1  . traMADol (ULTRAM) 50 MG tablet Take 50 mg by mouth every 6 (six) hours as needed for severe pain.    . traZODone (DESYREL) 100 MG tablet Take 1 tablet by mouth at  bedtime 90 tablet 1  . zolpidem (AMBIEN) 10 MG tablet Take 1 tablet (10 mg total) by mouth at bedtime as needed for sleep. 30 tablet 0  . [DISCONTINUED] niacin (NIASPAN) 1000 MG CR tablet Take 1 tablet (1,000 mg total) by mouth at bedtime. 30 tablet 6   No current facility-administered medications on file prior to visit.    BP 105/72 mmHg  Pulse 61  Temp(Src) 98.6 F (37 C) (Oral)  Resp 18  Ht 5\' 8"  (1.727 m)  Wt 207 lb (93.895 kg)  BMI 31.48 kg/m2  SpO2 99%    Objective:   Physical Exam  Constitutional: He is oriented to person, place, and time. He appears well-developed and well-nourished. No distress.  HENT:  Head: Normocephalic and atraumatic.  Cardiovascular: Normal rate and regular rhythm.   No murmur heard. Pulmonary/Chest: Effort normal and breath sounds normal. No respiratory distress. He has no wheezes. He has no rales.  Genitourinary:  Neg CVAT bilaterally  Musculoskeletal: He exhibits no edema.  Neurological: He is alert and oriented to person, place, and time.  Skin: Skin is warm and dry.  Psychiatric: He has a normal mood and affect. His behavior is normal. Thought content normal.          Assessment & Plan:

## 2016-03-09 NOTE — Assessment & Plan Note (Signed)
BP is still a little low. However he is feeling well and I would like to continue ACE for renal protection.

## 2016-03-11 LAB — URINE CULTURE: Colony Count: 25000

## 2016-03-13 ENCOUNTER — Telehealth: Payer: Self-pay | Admitting: Family

## 2016-03-13 MED ORDER — NITROFURANTOIN MONOHYD MACRO 100 MG PO CAPS: 100.0000 mg | ORAL_CAPSULE | Freq: Two times a day (BID) | ORAL | Status: DC

## 2016-03-13 NOTE — Telephone Encounter (Signed)
Urine shows mild UTI, begin macrobid.  Call if symptoms worsen or do not improve.

## 2016-03-15 NOTE — Telephone Encounter (Signed)
Left detailed message on pt's cell# and to call if any questions. 

## 2016-03-17 ENCOUNTER — Encounter: Payer: Self-pay | Admitting: Family

## 2016-03-18 ENCOUNTER — Telehealth: Payer: Self-pay

## 2016-03-18 NOTE — Telephone Encounter (Signed)
Noted. Thank you.  Will attempt to schedule.    Pt is on lisinopril (ACE inhibitor), therefore the urine microalbumin metric has been satisfied.

## 2016-03-18 NOTE — Telephone Encounter (Signed)
Patient is on the list for [employer] 2017 and may be a good candidate for an AWV in 2017. Please let me know if/when appt is scheduled.     Note: Patient is missing the depression screening and the microalbumin - urine (however there is no microalbumin modifier listed for him in the health maintenance).

## 2016-03-18 NOTE — Telephone Encounter (Signed)
It was more about there not being an indication that the metric was satisfied since the pt is on and Ace Inhibitor.

## 2016-04-05 ENCOUNTER — Other Ambulatory Visit: Payer: Self-pay | Admitting: Family

## 2016-04-06 NOTE — Telephone Encounter (Signed)
LVM advising patient of message below °

## 2016-04-08 ENCOUNTER — Other Ambulatory Visit: Payer: Self-pay | Admitting: Family

## 2016-04-08 ENCOUNTER — Other Ambulatory Visit: Payer: Self-pay | Admitting: Physical Medicine & Rehabilitation

## 2016-04-11 NOTE — Telephone Encounter (Signed)
Pt is requesting to get meloxicam through mail order service.  Please advise what 90 day supply would be if ok to do ongoing?

## 2016-04-12 ENCOUNTER — Telehealth: Payer: Self-pay | Admitting: *Deleted

## 2016-04-12 MED ORDER — CLONAZEPAM 1 MG PO TABS: 1.0000 mg | ORAL_TABLET | Freq: Every day | ORAL | 0 refills | Status: DC

## 2016-04-12 MED ORDER — ZOLPIDEM TARTRATE 10 MG PO TABS: 10.0000 mg | ORAL_TABLET | Freq: Every evening | ORAL | 0 refills | Status: DC | PRN

## 2016-04-12 NOTE — Telephone Encounter (Signed)
Last Clonazepam: 10/06/15, #30 Last Zolpidem: 11/11/15, #30 UDS: 02/08/16, moderate. Next due 05/10/16 Next OV: 06/08/16  Rxs printed and forwarded to PCP for signature.

## 2016-04-12 NOTE — Telephone Encounter (Signed)
Patient scheduled medicare wellness 04/20/2016 with nurse.

## 2016-04-12 NOTE — Telephone Encounter (Signed)
Rxs faxed to [facility].

## 2016-04-13 NOTE — Telephone Encounter (Signed)
Cody Raymond-- meloxicam rx went to [facility] for 14 day supply. Did you want pt to get 90 day supply?

## 2016-04-14 NOTE — Telephone Encounter (Signed)
No, just #14. tks

## 2016-04-20 ENCOUNTER — Ambulatory Visit (INDEPENDENT_AMBULATORY_CARE_PROVIDER_SITE_OTHER): Payer: Medicare Other

## 2016-04-20 ENCOUNTER — Encounter: Payer: Self-pay | Admitting: *Deleted

## 2016-04-20 VITALS — BP 106/72 | HR 87 | Resp 16 | Ht 68.0 in | Wt 198.6 lb

## 2016-04-20 DIAGNOSIS — Z0000 Encounter for general adult medical examination without abnormal findings: Secondary | ICD-10-CM

## 2016-04-20 DIAGNOSIS — M549 Dorsalgia, unspecified: Secondary | ICD-10-CM

## 2016-04-20 DIAGNOSIS — R350 Frequency of micturition: Secondary | ICD-10-CM | POA: Diagnosis not present

## 2016-04-20 DIAGNOSIS — G4700 Insomnia, unspecified: Secondary | ICD-10-CM

## 2016-04-20 DIAGNOSIS — G8929 Other chronic pain: Secondary | ICD-10-CM

## 2016-04-20 DIAGNOSIS — I10 Essential (primary) hypertension: Secondary | ICD-10-CM

## 2016-04-20 LAB — POCT URINALYSIS DIPSTICK
Bilirubin, UA: NEGATIVE
Glucose, UA: NEGATIVE
Ketones, UA: NEGATIVE
Leukocytes, UA: NEGATIVE
Nitrite, UA: NEGATIVE
PROTEIN UA: NEGATIVE
RBC UA: NEGATIVE
SPEC GRAV UA: 1.025
UROBILINOGEN UA: NEGATIVE
pH, UA: 6.5

## 2016-04-20 NOTE — Patient Instructions (Addendum)
Continue to eat heart healthy diet (full of fruits, vegetables, whole grains, lean protein, water--limit salt, fat, and sugar intake) and increase physical activity as tolerated. Continue doing brain stimulating activities (puzzles, reading, adult coloring books, staying active) to keep memory sharp.  Follow up w/ Melissa as scheduled. Bring a copy of your updated advanced directives to your next appointment.  Continue to check your blood pressure at home and bring your log to your next appointment.  Diabetes and Exercise Exercising regularly is important. It is not just about losing weight. It has many health benefits, such as:  Improving your overall fitness, flexibility, and endurance.  Increasing your bone density.  Helping with weight control.  Decreasing your body fat.  Increasing your muscle strength.  Reducing stress and tension.  Improving your overall health. People with diabetes who exercise gain additional benefits because exercise:  Reduces appetite.  Improves the body's use of blood sugar (glucose).  Helps lower or control blood glucose.  Decreases blood pressure.  Helps control blood lipids (such as cholesterol and triglycerides).  Improves the body's use of the hormone insulin by:  Increasing the body's insulin sensitivity.  Reducing the body's insulin needs.  Decreases the risk for heart disease because exercising:  Lowers cholesterol and triglycerides levels.  Increases the levels of good cholesterol (such as high-density lipoproteins [HDL]) in the body.  Lowers blood glucose levels. YOUR ACTIVITY PLAN  Choose an activity that you enjoy, and set realistic goals. To exercise safely, you should begin practicing any new physical activity slowly, and gradually increase the intensity of the exercise over time. Your health care provider or diabetes educator can help create an activity plan that works for you. General recommendations include:  Encouraging  children to engage in at least 60 minutes of physical activity each day.  Stretching and performing strength training exercises, such as yoga or weight lifting, at least 2 times per week.  Performing a total of at least 150 minutes of moderate-intensity exercise each week, such as brisk walking or water aerobics.  Exercising at least 3 days per week, making sure you allow no more than 2 consecutive days to pass without exercising.  Avoiding long periods of inactivity (90 minutes or more). When you have to spend an extended period of time sitting down, take frequent breaks to walk or stretch. RECOMMENDATIONS FOR EXERCISING WITH TYPE 1 OR TYPE 2 DIABETES   Check your blood glucose before exercising. If blood glucose levels are greater than 240 mg/dL, check for urine ketones. Do not exercise if ketones are present.  Avoid injecting insulin into areas of the body that are going to be exercised. For example, avoid injecting insulin into:  The arms when playing tennis.  The legs when jogging.  Keep a record of:  Food intake before and after you exercise.  Expected peak times of insulin action.  Blood glucose levels before and after you exercise.  The type and amount of exercise you have done.  Review your records with your health care provider. Your health care provider will help you to develop guidelines for adjusting food intake and insulin amounts before and after exercising.  If you take insulin or oral hypoglycemic agents, watch for signs and symptoms of hypoglycemia. They include:  Dizziness.  Shaking.  Sweating.  Chills.  Confusion.  Drink plenty of water while you exercise to prevent dehydration or heat stroke. Body water is lost during exercise and must be replaced.  Talk to your health care provider before  starting an exercise program to make sure it is safe for you. Remember, almost any type of activity is better than none.   This information is not intended to  replace advice given to you by your health care provider. Make sure you discuss any questions you have with your health care provider.   Document Released: 11/26/2003 Document Revised: 01/20/2015 Document Reviewed: 02/12/2013 Elsevier Interactive Patient Education [employer].

## 2016-04-20 NOTE — Assessment & Plan Note (Signed)
Pt reports compliance w/ medication. Does have occasional GI upset w/ metformin. He has been exercising and losing weight and hopes to eventually be able to control diabetes w/ diet. He cut out soda completely and is drinking more water. Applauded pt's lifestyle changes and weight loss. Dietary counseling provided.  Wt Readings from Last 3 Encounters:  04/20/16 198 lb 9.6 oz (90.1 kg)  03/09/16 207 lb (93.9 kg)  02/12/16 214 lb 6.4 oz (97.3 kg)

## 2016-04-20 NOTE — Progress Notes (Signed)
Subjective:   Cody Raymond is a 45 y.o. male who presents for an Initial Medicare Annual Wellness Visit.  Review of Systems  No ROS.  Medicare Wellness Visit.  Cardiac Risk Factors include: diabetes mellitus;dyslipidemia;male gender;hypertension;obesity (BMI >30kg/m2)  Sleep patterns: Pt reports he is restless at night. 4-6 hours nightly. Gets up 2-3 nightly to void. Reports trazodone and Ambien do not help his sleep. He has some additional stress dealing with his 11 year old daughter who had left home recently and feels that this interferes with sleep.      Home Safety/Smoke Alarms: Lives in [place] w/ wife and 11 y/o daughter. Feels safe in home. Smoke alarms in home. Firearm Safety: Firearms stored in a safe place. Seat Belt Safety/Bike Helmet: Always wears seat belt.   Counseling:   Eye Exam- Sees Dr. Bing Plume yearly.   Dental- [facility] every 6 months.  Male:   CCS: 03/17/11 w/ Dr. Carlean Purl, 4 mm diminutive polyp in ascending colon; 10 year recall    PSA-  Lab Results  Component Value Date   PSA 0.55 11/10/2010   PSA 0.55 11/10/2010       Objective:    Today's Vitals   04/20/16 0940 04/20/16 0943  BP: 106/72   Pulse: 87   Resp: 16   SpO2: 98%   Weight: 198 lb 9.6 oz (90.1 kg)   Height: 5\' 8"  (1.727 m)   PainSc:  9    Body mass index is 30.2 kg/m.  Current Medications (verified) Outpatient Encounter Prescriptions as of 04/20/2016  Medication Sig  . albuterol (PROVENTIL) (2.5 MG/3ML) 0.083% nebulizer solution Use 1 vial via nebulizer  every 6 hours as needed for wheezing or shortness of  breath  . aspirin 325 MG tablet Take 325 mg by mouth daily.  . cetirizine (ZYRTEC) 10 MG tablet Take 10 mg by mouth daily.  . clonazePAM (KLONOPIN) 1 MG tablet Take 1 tablet (1 mg total) by mouth daily.  . diclofenac sodium (VOLTAREN) 1 % GEL Apply 2 g topically 4 (four) times daily.  . fenofibrate (TRICOR) 145 MG tablet Take 1 tablet by mouth  daily  . fish oil-omega-3  fatty acids 1000 MG capsule Take 2 g by mouth daily.   . Fluticasone-Salmeterol (ADVAIR) 100-50 MCG/DOSE AEPB Inhale 1 puff into the lungs 2 (two) times daily.  Marland Kitchen gabapentin (NEURONTIN) 300 MG capsule Take 1 capsule by mouth at  6am, 11:30am and 4:30pm and take 3 capsules at 10:00pm  . ketoconazole (NIZORAL) 2 % shampoo Apply topically daily as  directed  . lansoprazole (PREVACID) 15 MG capsule Take 15 mg by mouth daily.  Marland Kitchen lisinopril (PRINIVIL,ZESTRIL) 5 MG tablet Take 1 tablet by mouth  daily  . loperamide (IMODIUM) 2 MG capsule Take 1 capsule (2 mg total) by mouth as needed for diarrhea or loose stools.  Marland Kitchen LORazepam (ATIVAN) 1 MG tablet Take 1 tablet (1 mg total) by mouth once. Take 1 tablet (1 mg total) by mouth once 30 minutes-1 hour prior to cardiac MRI.  . meloxicam (MOBIC) 7.5 MG tablet Take 1 tablet by mouth  daily  . metFORMIN (GLUCOPHAGE-XR) 500 MG 24 hr tablet Take 2 tablets (1,000 mg total) by mouth daily with breakfast.  . montelukast (SINGULAIR) 10 MG tablet Take 1 tablet by mouth at  bedtime  . Multiple Vitamin (MULTIVITAMIN WITH MINERALS) TABS tablet Take 1 tablet by mouth daily.  . naproxen sodium (ALEVE) 220 MG tablet Take 440 mg by mouth 2 (two) times daily.  Take 440 mg in the morning and 220 mg in the evening.  . nitroGLYCERIN (NITROSTAT) 0.4 MG SL tablet Place 1 tablet (0.4 mg total) under the tongue every 5 (five) minutes as needed for chest pain.  Marland Kitchen ondansetron (ZOFRAN ODT) 4 MG disintegrating tablet Take 1 tablet (4 mg total) by mouth every 8 (eight) hours as needed for nausea or vomiting.  Glory Rosebush VERIO test strip Check blood sugar 3 times  daily  . ramelteon (ROZEREM) 8 MG tablet Take 1 tablet (8 mg total) by mouth at bedtime.  . rosuvastatin (CRESTOR) 40 MG tablet Take 1 tablet (40 mg total) by mouth daily.  . sertraline (ZOLOFT) 50 MG tablet Take 1 tablet by mouth  daily  . tamsulosin (FLOMAX) 0.4 MG CAPS capsule Take 1 capsule by mouth  daily  . tiZANidine  (ZANAFLEX) 2 MG tablet Take 1 tablet by mouth  every 6 hours as needed for muscle spasm(s)  . traMADol (ULTRAM) 50 MG tablet Take 50 mg by mouth every 6 (six) hours as needed for severe pain.  . traZODone (DESYREL) 100 MG tablet Take 1 tablet by mouth at  bedtime  . zolpidem (AMBIEN) 10 MG tablet Take 1 tablet (10 mg total) by mouth at bedtime as needed for sleep.  . [DISCONTINUED] nitrofurantoin, macrocrystal-monohydrate, (MACROBID) 100 MG capsule Take 1 capsule (100 mg total) by mouth 2 (two) times daily. (Patient not taking: Reported on 04/20/2016)   No facility-administered encounter medications on file as of 04/20/2016.     Allergies (verified) Benadryl [diphenhydramine hcl] and Red blood cells   History: Past Medical History:  Diagnosis Date  . Asthma   . Blood transfusion   . CAD (coronary artery disease), non obstructive on cath 2011 04/05/2012  . Coronary artery disease   . Diabetes mellitus   . Enlarged prostate   . GERD (gastroesophageal reflux disease)   . H/O syncope   . Heart disease   . Hyperlipidemia   . Hypertension   . 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 Refusal of blood transfusions as patient is Jehovah's Witness    Past Surgical History:  Procedure Laterality Date  . APPENDECTOMY    . BACK SURGERY    . CARDIAC CATHETERIZATION  2007   Clean Cardiac Cath ([place]), with RCA 30% narrowing likely due to catheter induced spasm in 09/02/10.  Marland Kitchen CARDIAC CATHETERIZATION  09/02/2010   mod. nonobstructive disease in the RCA and CX, tortuous LAD  . COLONOSCOPY W/ POLYPECTOMY  03/17/11   diminutive polyp  . LOOP RECORDER EXPLANT N/A 01/14/2014   Procedure: LOOP RECORDER EXPLANT;  Surgeon: Sanda Klein, MD;  Location: [facility] CATH [facility];  Service: Cardiovascular;  Laterality: N/A;  . LOOP RECORDER IMPLANT  04/06/2012   Reveal XT [id number]  . LOOP RECORDER IMPLANT N/A 04/06/2012   Procedure: LOOP RECORDER IMPLANT;  Surgeon: Sanda Klein, MD;  Location: [facility] CATH [facility];   Service: Cardiovascular;  Laterality: N/A;  . NM MYOCAR PERF WALL MOTION  01/25/2008   mild anteroapical wall ischemia  . PATENT DUCTUS ARTERIOUS REPAIR     at age 29   Family History  Problem Relation Age of Onset  . Colon cancer Paternal Grandfather   . Prostate cancer Paternal Grandfather   . Aneurysm Father   . Heart attack Father 25  . Coronary artery disease Father   . Colon polyps Mother   . Heart disease Mother   . Coronary artery disease Mother   . Migraines Mother   .  Breast cancer Maternal Grandmother   . Colon cancer Paternal Uncle     x 2  . Stomach cancer Brother   . Liver disease      unsure who it was  . Allergies Daughter    Social History   Occupational History  . Not on file.   Social History Main Topics  . Smoking status: Never Smoker  . Smokeless tobacco: Never Used  . Alcohol use No  . Drug use: No  . Sexual activity: Not on file   Tobacco Counseling Counseling given: Not Answered   Activities of Daily Living In your present state of health, do you have any difficulty performing the following activities: 04/20/2016  Hearing? N  Vision? N  Difficulty concentrating or making decisions? N  Walking or climbing stairs? Y  Dressing or bathing? Y  Doing errands, shopping? Y  Preparing Food and eating ? N  Using the Toilet? N  In the past six months, have you accidently leaked urine? N  Do you have problems with loss of bowel control? N  Managing your Medications? N  Managing your Finances? N  Housekeeping or managing your Housekeeping? N  Some recent data might be hidden    Immunizations and Health Maintenance Immunization History  Administered Date(s) Administered  . Influenza, Seasonal, Injecte, Preservative Fre 10/22/2012  . Influenza,inj,Quad PF,36+ Mos 07/29/2013, 07/08/2014, 06/20/2015, 09/30/2015  . Pneumococcal Polysaccharide-23 08/20/2014  . Tdap 04/04/2012   Health Maintenance Due  Topic Date Due  . HEMOGLOBIN A1C  03/29/2016     Patient Care Team: [name], NP as PCP - General (Internal Medicine) Sanda Klein, MD as Consulting Physician (Cardiology) Charlett Blake, MD as Consulting Physician (Pain Medicine) Almyra Deforest, [place] as Consulting Physician (Cardiology) Bayard Hugger, NP as Nurse Practitioner (Physical Medicine and Rehabilitation) Calvert Cantor, MD as Consulting Physician (Ophthalmology)  Indicate any recent Medical Services you may have received from other than [facility] providers in the past year (date may be approximate).    Assessment:   This is a routine wellness examination for Cody Raymond. Physical assessment deferred to PCP.  Recently treated for UTI, still having urinary frequency. Repeat UA and culture today per PCP.  Hearing/Vision screen  Hearing Screening   125Hz  250Hz  500Hz  1000Hz  2000Hz  3000Hz  4000Hz  6000Hz  8000Hz   Right ear:   Pass Pass Pass  Pass    Left ear:   Pass Pass Pass  Pass    Comments: Able to hear conversational tones w/o difficulty.   Visual Acuity Screening   Right eye Left eye Both eyes  Without correction: 20/20 20/20 20/20   With correction:     Comments: Follows w/ Dr. Bing Plume annually.   Dietary issues and exercise activities discussed: Current Exercise Habits: Structured exercise class, Type of exercise: treadmill;Other - see comments (Walks in the pool.), Frequency (Times/Week): 3, Intensity: Moderate, Exercise limited by: orthopedic condition(s) (chronic R leg pain, chronic back pain)  Diet (meal preparation, eat out, water intake, caffeinated beverages, dairy products, fruits and vegetables): Drinks lots of water. Prepares own meals. Drinks apple cider vinegar 1 tsp twice daily w/ water.  Breakfast: Boiled or scrambled eggs w/ lettuce and onions Lunch: Sometimes skips lunch. Maybe 1/2 banana or gets something out to eat. Dinner: Sometimes skips dinner. Meatloaf, lasagna, baked potato. Tries to to be mindful of sodium.     Goals    . Weight (lb) < 170  lb (77.1 kg)      Depression Screen [facility] 2/9 Scores 04/20/2016 04/27/2015 12/25/2014  PHQ - 2 Score 1 - 3  PHQ- 9 Score - - 11  Exception Documentation - Other- indicate reason in comment box -  Not completed - previously screened -    Fall Risk Fall Risk  04/20/2016 10/19/2015 06/22/2015 04/27/2015  Falls in the past year? Yes Yes No No  Number falls in past yr: 1 1 - -  Injury with Fall? No No - -  Risk for fall due to : - - Impaired mobility;Medication side effect Impaired mobility;Medication side effect    Cognitive Function: MMSE - Mini Mental State Exam 04/20/2016  Orientation to time 4  Orientation to Place 5  Registration 3  Attention/ Calculation 5  Recall 1  Language- name 2 objects 2  Language- repeat 1  Language- follow 3 step command 3  Language- read & follow direction 1  Write a sentence 1  Copy design 1  Total score 27    Screening Tests Health Maintenance  Topic Date Due  . HEMOGLOBIN A1C  03/29/2016  . FOOT EXAM  05/10/2016 (Originally 10/14/2015)  . INFLUENZA VACCINE  05/20/2016 (Originally 04/19/2016)  . HIV Screening  02/07/2017 (Originally 11/11/1985)  . OPHTHALMOLOGY EXAM  08/23/2016  . PNEUMOCOCCAL POLYSACCHARIDE VACCINE (2) 08/21/2019  . TETANUS/TDAP  04/04/2022        Plan:   Continue to eat heart healthy diet (full of fruits, vegetables, whole grains, lean protein, water--limit salt, fat, and sugar intake) and increase physical activity as tolerated. Continue doing brain stimulating activities (puzzles, reading, adult coloring books, staying active) to keep memory sharp.  Follow up w/ Melissa as scheduled. Bring a copy of your updated advanced directives to your next appointment.  Continue to check your blood pressure at home and bring your log to your next appointment.  During the course of the visit Amarian was educated and counseled about the following appropriate screening and preventive services:   Vaccines to include Pneumoccal, Influenza,  Hepatitis B, Td, Zostavax, HCV  Electrocardiogram  Colorectal cancer screening  Cardiovascular disease screening  Diabetes screening  Glaucoma screening  Nutrition counseling  Prostate cancer screening  Smoking cessation counseling  Patient Instructions (the written plan) were given to the patient.   Dorrene German, RN   04/20/2016

## 2016-04-20 NOTE — Progress Notes (Signed)
Reviewed and agree.

## 2016-04-20 NOTE — Assessment & Plan Note (Signed)
See notes on sleep patterns. Ongoing issue for pt. Follow-up w/ PCP.

## 2016-04-20 NOTE — Assessment & Plan Note (Addendum)
Ongoing issue for patient. Rates pain 9/10 today. Reports tramadol provides some relief, but he does not take during the day or when he is going to be driving. Follow-up w/ PCP.

## 2016-04-20 NOTE — Progress Notes (Signed)
Pre visit review using our clinic review tool, if applicable. No additional management support is needed unless otherwise documented below in the visit note. 

## 2016-04-20 NOTE — Assessment & Plan Note (Signed)
Pt reports compliance w/ medications. Checks BP about three times daily at home. States about twice weekly he will have elevated BP (sometimes up to 123456 systolic) in the evenings with associated nausea and dizziness. Also has occasional low BP (100s/50s) w/ associated dizziness. Asymptomatic today. Follow-up w/ PCP.

## 2016-04-22 LAB — CULTURE, URINE COMPREHENSIVE

## 2016-04-23 ENCOUNTER — Telehealth: Payer: Self-pay | Admitting: Family

## 2016-04-23 MED ORDER — CIPROFLOXACIN HCL 500 MG PO TABS: 500.0000 mg | ORAL_TABLET | Freq: Two times a day (BID) | ORAL | 0 refills | Status: DC

## 2016-04-23 NOTE — Telephone Encounter (Signed)
See mychart.  

## 2016-05-16 ENCOUNTER — Ambulatory Visit: Payer: Medicare Other | Admitting: Cardiovascular Disease

## 2016-05-27 ENCOUNTER — Other Ambulatory Visit: Payer: Self-pay | Admitting: Family

## 2016-06-08 ENCOUNTER — Encounter: Payer: Self-pay | Admitting: Family

## 2016-06-08 ENCOUNTER — Ambulatory Visit (INDEPENDENT_AMBULATORY_CARE_PROVIDER_SITE_OTHER): Payer: Medicare Other | Admitting: Family

## 2016-06-08 VITALS — BP 106/79 | HR 82 | Temp 98.4°F | Resp 16 | Ht 68.0 in | Wt 194.8 lb

## 2016-06-08 DIAGNOSIS — I10 Essential (primary) hypertension: Secondary | ICD-10-CM | POA: Diagnosis not present

## 2016-06-08 DIAGNOSIS — Z23 Encounter for immunization: Secondary | ICD-10-CM

## 2016-06-08 DIAGNOSIS — E1140 Type 2 diabetes mellitus with diabetic neuropathy, unspecified: Secondary | ICD-10-CM

## 2016-06-08 DIAGNOSIS — E785 Hyperlipidemia, unspecified: Secondary | ICD-10-CM | POA: Diagnosis not present

## 2016-06-08 DIAGNOSIS — E119 Type 2 diabetes mellitus without complications: Secondary | ICD-10-CM

## 2016-06-08 DIAGNOSIS — E781 Pure hyperglyceridemia: Secondary | ICD-10-CM

## 2016-06-08 LAB — BASIC METABOLIC PANEL
BUN: 13 mg/dL (ref 6–23)
CHLORIDE: 101 meq/L (ref 96–112)
CO2: 30 mEq/L (ref 19–32)
CREATININE: 0.95 mg/dL (ref 0.40–1.50)
Calcium: 8.8 mg/dL (ref 8.4–10.5)
GFR: 90.89 mL/min (ref >60.00)
GLUCOSE: 95 mg/dL (ref 70–99)
POTASSIUM: 4 meq/L (ref 3.5–5.1)
Sodium: 138 mEq/L (ref 135–145)

## 2016-06-08 LAB — LIPID PANEL
CHOL/HDL RATIO: 5
Cholesterol: 186 mg/dL (ref 0–200)
HDL: 36.4 mg/dL — ABNORMAL LOW (ref >39.00)
NonHDL: 149.91
Triglycerides: 211 mg/dL — ABNORMAL HIGH (ref 0.0–149.0)
VLDL: 42.2 mg/dL — AB (ref 0.0–40.0)

## 2016-06-08 LAB — HEMOGLOBIN A1C: Hgb A1c MFr Bld: 5.4 % (ref 4.6–6.5)

## 2016-06-08 LAB — LDL CHOLESTEROL, DIRECT: LDL DIRECT: 102 mg/dL

## 2016-06-08 MED ORDER — ROSUVASTATIN CALCIUM 40 MG PO TABS: 40.0000 mg | ORAL_TABLET | Freq: Every day | ORAL | 5 refills | Status: DC

## 2016-06-08 NOTE — Progress Notes (Signed)
Subjective:    Patient ID: Cody Raymond, male    DOB: Sep 09, 1971, 45 y.o.   MRN: [id number]  HPI  Cody Raymond is a 45 yr old male who presents today for follow up.  1) DM2- home sugars are around 110.  He continues metformin.  Would like flu shot today.  Lab Results  Component Value Date   HGBA1C 6.4 09/30/2015   HGBA1C 5.8 04/17/2015   HGBA1C 6.4 01/16/2015   Lab Results  Component Value Date   MICROALBUR 1.15 11/20/2013   LDLCALC 122 (H) 04/17/2015   CREATININE 0.96 02/08/2016   He is working on weight loss.   Wt Readings from Last 3 Encounters:  06/08/16 194 lb 12.8 oz (88.4 kg)  04/20/16 198 lb 9.6 oz (90.1 kg)  03/09/16 207 lb (93.9 kg)    2) HTN- Denies swelling.  Continues lisinopril.  BP Readings from Last 3 Encounters:  06/08/16 106/79  04/20/16 106/72  03/09/16 105/72   3) Hyperlipidemia- on cholesterol and fenofibrate.   Lab Results  Component Value Date   CHOL 191 11/11/2015   HDL 33.70 (L) 11/11/2015   LDLCALC 122 (H) 04/17/2015   LDLDIRECT 55.0 11/11/2015   TRIG (H) 11/11/2015    471.0 Triglyceride is over 400; calculations on Lipids are invalid.   CHOLHDL 6 11/11/2015   Headache- + HA x 4 days. Using ibuprofen/tylenol without improvement. Denies sinus congestion.  No vision changes.  He has associated nausea yesterday. Has remote hx of migraine but this is different from previous migraines.     Review of Systems     Past Medical History:  Diagnosis Date  . Asthma   . Blood transfusion   . CAD (coronary artery disease), non obstructive on cath 2011 04/05/2012  . Coronary artery disease   . Diabetes mellitus   . Enlarged prostate   . GERD (gastroesophageal reflux disease)   . H/O syncope   . Heart disease   . Hyperlipidemia   . Hypertension   . 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 Refusal of blood transfusions as patient is Jehovah's Witness      Social History   Social History  . Marital status: Married    Spouse name: N/A   . Number of children: N/A  . Years of education: N/A   Occupational History  . Not on file.   Social History Main Topics  . Smoking status: Never Smoker  . Smokeless tobacco: Never Used  . Alcohol use No  . Drug use: No  . Sexual activity: Not on file   Other Topics Concern  . Not on file   Social History Narrative   Holter monitor 08/2010: PVCs and sinus tachy.   Sleep Study (02/2008): mild sleep apnea, no indication for CPAP.    Past Surgical History:  Procedure Laterality Date  . APPENDECTOMY    . BACK SURGERY    . CARDIAC CATHETERIZATION  2007   Clean Cardiac Cath ([place]), with RCA 30% narrowing likely due to catheter induced spasm in 09/02/10.  Marland Kitchen CARDIAC CATHETERIZATION  09/02/2010   mod. nonobstructive disease in the RCA and CX, tortuous LAD  . COLONOSCOPY W/ POLYPECTOMY  03/17/11   diminutive polyp  . LOOP RECORDER EXPLANT N/A 01/14/2014   Procedure: LOOP RECORDER EXPLANT;  Surgeon: Sanda Klein, MD;  Location: [place] CATH [facility];  Service: Cardiovascular;  Laterality: N/A;  . LOOP RECORDER IMPLANT  04/06/2012   Reveal XT [id number]  . LOOP RECORDER IMPLANT N/A  04/06/2012   Procedure: LOOP RECORDER IMPLANT;  Surgeon: Sanda Klein, MD;  Location: [facility] CATH LAB;  Service: Cardiovascular;  Laterality: N/A;  . NM MYOCAR PERF WALL MOTION  01/25/2008   mild anteroapical wall ischemia  . PATENT DUCTUS ARTERIOUS REPAIR     at age 75    Family History  Problem Relation Age of Onset  . Colon cancer Paternal Grandfather   . Prostate cancer Paternal Grandfather   . Aneurysm Father   . Heart attack Father 87  . Coronary artery disease Father   . Colon polyps Mother   . Heart disease Mother   . Coronary artery disease Mother   . Migraines Mother   . Breast cancer Maternal Grandmother   . Colon cancer Paternal Uncle     x 2  . Stomach cancer Brother   . Liver disease      unsure who it was  . Allergies Daughter     Allergies  Allergen Reactions  . Benadryl  [Diphenhydramine Hcl]     "drives me nuts"  . Red Blood Cells     PT. REFUSES ANY BLOOD PRODUCTS - JEHOVAH'S WITNESS.    Current Outpatient Prescriptions on File Prior to Visit  Medication Sig Dispense Refill  . albuterol (PROVENTIL) (2.5 MG/3ML) 0.083% nebulizer solution Use 1 vial via nebulizer  every 6 hours as needed for wheezing or shortness of  breath 600 mL 1  . aspirin 325 MG tablet Take 325 mg by mouth daily.    . cetirizine (ZYRTEC) 10 MG tablet Take 10 mg by mouth daily.    . clonazePAM (KLONOPIN) 1 MG tablet Take 1 tablet (1 mg total) by mouth daily. 30 tablet 0  . diclofenac sodium (VOLTAREN) 1 % GEL Apply 2 g topically 4 (four) times daily.    . fenofibrate (TRICOR) 145 MG tablet Take 1 tablet by mouth  daily 90 tablet 1  . fish oil-omega-3 fatty acids 1000 MG capsule Take 2 g by mouth daily.     . Fluticasone-Salmeterol (ADVAIR) 100-50 MCG/DOSE AEPB Inhale 1 puff into the lungs 2 (two) times daily. 1 each 3  . gabapentin (NEURONTIN) 300 MG capsule TAKE 1 CAPSULE BY MOUTH AT  6AM, 11:30AM AND 4:30PM AND TAKE 3 CAPSULES AT 10:00PM 540 capsule 1  . ketoconazole (NIZORAL) 2 % shampoo Apply topically daily as  directed 360 mL 1  . lansoprazole (PREVACID) 15 MG capsule Take 15 mg by mouth daily.    Marland Kitchen lisinopril (PRINIVIL,ZESTRIL) 5 MG tablet Take 1 tablet by mouth  daily 90 tablet 1  . loperamide (IMODIUM) 2 MG capsule Take 1 capsule (2 mg total) by mouth as needed for diarrhea or loose stools. 12 capsule 0  . LORazepam (ATIVAN) 1 MG tablet Take 1 tablet (1 mg total) by mouth once. Take 1 tablet (1 mg total) by mouth once 30 minutes-1 hour prior to cardiac MRI. 1 tablet 0  . meloxicam (MOBIC) 7.5 MG tablet Take 1 tablet by mouth  daily 14 tablet 0  . metFORMIN (GLUCOPHAGE-XR) 500 MG 24 hr tablet Take 2 tablets by mouth  daily with breakfast 180 tablet 1  . montelukast (SINGULAIR) 10 MG tablet Take 1 tablet by mouth at  bedtime 90 tablet 1  . Multiple Vitamin (MULTIVITAMIN WITH  MINERALS) TABS tablet Take 1 tablet by mouth daily.    . naproxen sodium (ALEVE) 220 MG tablet Take 440 mg by mouth 2 (two) times daily. Take 440 mg in the morning and 220 mg  in the evening.    . nitroGLYCERIN (NITROSTAT) 0.4 MG SL tablet Place 1 tablet (0.4 mg total) under the tongue every 5 (five) minutes as needed for chest pain. 30 tablet 1  . ondansetron (ZOFRAN ODT) 4 MG disintegrating tablet Take 1 tablet (4 mg total) by mouth every 8 (eight) hours as needed for nausea or vomiting. 20 tablet 0  . ONETOUCH VERIO test strip Check blood sugar 3 times  daily 300 each 1  . ramelteon (ROZEREM) 8 MG tablet Take 1 tablet (8 mg total) by mouth at bedtime. 30 tablet 5  . sertraline (ZOLOFT) 50 MG tablet Take 1 tablet by mouth  daily 90 tablet 1  . tamsulosin (FLOMAX) 0.4 MG CAPS capsule Take 1 capsule by mouth  daily 90 capsule 1  . tiZANidine (ZANAFLEX) 2 MG tablet Take 1 tablet by mouth  every 6 hours as needed for muscle spasm(s) 270 tablet 0  . traMADol (ULTRAM) 50 MG tablet Take 50 mg by mouth every 6 (six) hours as needed for severe pain.    . traZODone (DESYREL) 100 MG tablet Take 1 tablet by mouth at  bedtime 90 tablet 1  . zolpidem (AMBIEN) 10 MG tablet Take 1 tablet (10 mg total) by mouth at bedtime as needed for sleep. 30 tablet 0  . [DISCONTINUED] niacin (NIASPAN) 1000 MG CR tablet Take 1 tablet (1,000 mg total) by mouth at bedtime. 30 tablet 6   No current facility-administered medications on file prior to visit.     BP 106/79 (BP Location: Left Arm, Cuff Size: Normal)   Pulse 82   Temp 98.4 F (36.9 C) (Oral)   Resp 16   Ht 5\' 8"  (1.727 m)   Wt 194 lb 12.8 oz (88.4 kg)   SpO2 100% Comment: room air  BMI 29.62 kg/m    Objective:   Physical Exam  Constitutional: He is oriented to person, place, and time. He appears well-developed and well-nourished. No distress.  HENT:  Head: Normocephalic and atraumatic.  Eyes: EOM are normal.  Cardiovascular: Normal rate and regular  rhythm.   No murmur heard. Pulmonary/Chest: Effort normal and breath sounds normal. No respiratory distress. He has no wheezes. He has no rales.  Musculoskeletal: He exhibits no edema.  Neurological: He is alert and oriented to person, place, and time. No cranial nerve deficit. He exhibits normal muscle tone.  Skin: Skin is warm and dry.  Psychiatric: He has a normal mood and affect. His behavior is normal. Thought content normal.          Assessment & Plan:  Headache- suspect mild allergies. Suspect allergy related.  Advised pt to let me know if HA worsens or does not improve.

## 2016-06-08 NOTE — Progress Notes (Signed)
Pre visit review using our clinic review tool, if applicable. No additional management support is needed unless otherwise documented below in the visit note. 

## 2016-06-08 NOTE — Patient Instructions (Addendum)
Please complete lab work prior to leaving. Great job with the weight loss, keep up the good work!

## 2016-06-12 ENCOUNTER — Telehealth: Payer: Self-pay | Admitting: Family

## 2016-06-12 MED ORDER — EZETIMIBE 10 MG PO TABS: 10.0000 mg | ORAL_TABLET | Freq: Every day | ORAL | 5 refills | Status: DC

## 2016-06-12 NOTE — Assessment & Plan Note (Addendum)
Lab Results  Component Value Date   CHOL 186 06/08/2016   HDL 36.40 (L) 06/08/2016   LDLCALC 122 (H) 04/17/2015   LDLDIRECT 102.0 06/08/2016   TRIG 211.0 (H) 06/08/2016   CHOLHDL 5 06/08/2016   Follow up lipids above goal.  Continue crestor 40mg .  Tricor, will add zetia.

## 2016-06-12 NOTE — Assessment & Plan Note (Addendum)
BP stable, continue lisinopril.

## 2016-06-12 NOTE — Assessment & Plan Note (Signed)
Lab Results  Component Value Date   HGBA1C 5.4 06/08/2016   Much improved.  Continue metformin for now. If further weight loss, may need to d/c.

## 2016-06-12 NOTE — Telephone Encounter (Signed)
Please let pt know cholesterol still mildly above goal. I would like him to add zetia. Sugar is much improved.

## 2016-06-13 NOTE — Telephone Encounter (Signed)
Notified pt and he voices understanding. 

## 2016-06-17 ENCOUNTER — Ambulatory Visit: Payer: Medicare Other | Admitting: Cardiovascular Disease

## 2016-06-21 ENCOUNTER — Other Ambulatory Visit: Payer: Self-pay | Admitting: Physical Medicine & Rehabilitation

## 2016-06-21 ENCOUNTER — Other Ambulatory Visit: Payer: Self-pay | Admitting: Cardiovascular Disease

## 2016-06-21 ENCOUNTER — Other Ambulatory Visit: Payer: Self-pay | Admitting: Family

## 2016-06-22 NOTE — Telephone Encounter (Signed)
[name],  Medication refills can be discussed at your upcoming appointment on October 9th, 2017 with Dr. Letta Pate.    Sincerely,  Mancel Parsons, CMA

## 2016-06-22 NOTE — Telephone Encounter (Signed)
Melissa-- please advise refills?

## 2016-06-23 ENCOUNTER — Ambulatory Visit: Payer: Self-pay | Admitting: Physical Medicine & Rehabilitation

## 2016-06-23 MED ORDER — DICLOFENAC SODIUM 1 % TD GEL: 2.0000 g | Freq: Four times a day (QID) | TRANSDERMAL | 0 refills | Status: DC

## 2016-06-23 MED ORDER — CLONAZEPAM 1 MG PO TABS: 1.0000 mg | ORAL_TABLET | Freq: Every day | ORAL | 0 refills | Status: DC

## 2016-06-23 MED ORDER — ZOLPIDEM TARTRATE 10 MG PO TABS: 10.0000 mg | ORAL_TABLET | Freq: Every evening | ORAL | 0 refills | Status: DC | PRN

## 2016-06-23 NOTE — Telephone Encounter (Signed)
I would rather him use the gel than the meloxicam longer term.  Ok to call in other rx's as pended.

## 2016-06-27 ENCOUNTER — Ambulatory Visit: Payer: Self-pay | Admitting: Physical Medicine & Rehabilitation

## 2016-07-01 ENCOUNTER — Ambulatory Visit: Payer: Medicare Other | Admitting: Cardiovascular Disease

## 2016-07-07 ENCOUNTER — Encounter: Payer: Self-pay | Admitting: Physical Medicine & Rehabilitation

## 2016-07-07 ENCOUNTER — Ambulatory Visit (HOSPITAL_BASED_OUTPATIENT_CLINIC_OR_DEPARTMENT_OTHER): Payer: Medicare Other | Admitting: Physical Medicine & Rehabilitation

## 2016-07-07 ENCOUNTER — Encounter: Payer: Medicare Other | Attending: Physical Medicine & Rehabilitation

## 2016-07-07 ENCOUNTER — Ambulatory Visit
Admission: RE | Admit: 2016-07-07 | Discharge: 2016-07-07 | Disposition: A | Discharge status: Home / Self Care | Payer: Medicare Other | Source: Ambulatory Visit | Attending: Physical Medicine & Rehabilitation | Admitting: Physical Medicine & Rehabilitation

## 2016-07-07 VITALS — BP 111/75 | HR 98 | Resp 14

## 2016-07-07 DIAGNOSIS — M7061 Trochanteric bursitis, right hip: Secondary | ICD-10-CM | POA: Insufficient documentation

## 2016-07-07 DIAGNOSIS — M47812 Spondylosis without myelopathy or radiculopathy, cervical region: Secondary | ICD-10-CM

## 2016-07-07 DIAGNOSIS — Z800 Family history of malignant neoplasm of digestive organs: Secondary | ICD-10-CM | POA: Insufficient documentation

## 2016-07-07 DIAGNOSIS — Z803 Family history of malignant neoplasm of breast: Secondary | ICD-10-CM | POA: Insufficient documentation

## 2016-07-07 DIAGNOSIS — Z8249 Family history of ischemic heart disease and other diseases of the circulatory system: Secondary | ICD-10-CM | POA: Insufficient documentation

## 2016-07-07 DIAGNOSIS — M545 Low back pain: Secondary | ICD-10-CM | POA: Insufficient documentation

## 2016-07-07 DIAGNOSIS — I10 Essential (primary) hypertension: Secondary | ICD-10-CM | POA: Diagnosis not present

## 2016-07-07 DIAGNOSIS — M797 Fibromyalgia: Secondary | ICD-10-CM | POA: Insufficient documentation

## 2016-07-07 DIAGNOSIS — E785 Hyperlipidemia, unspecified: Secondary | ICD-10-CM | POA: Diagnosis not present

## 2016-07-07 DIAGNOSIS — M542 Cervicalgia: Secondary | ICD-10-CM

## 2016-07-07 DIAGNOSIS — E119 Type 2 diabetes mellitus without complications: Secondary | ICD-10-CM | POA: Insufficient documentation

## 2016-07-07 DIAGNOSIS — I2510 Atherosclerotic heart disease of native coronary artery without angina pectoris: Secondary | ICD-10-CM | POA: Insufficient documentation

## 2016-07-07 DIAGNOSIS — M5459 Other low back pain: Secondary | ICD-10-CM

## 2016-07-07 DIAGNOSIS — K219 Gastro-esophageal reflux disease without esophagitis: Secondary | ICD-10-CM | POA: Insufficient documentation

## 2016-07-07 NOTE — Progress Notes (Signed)
Subjective:    Patient ID: Cody Raymond, male    DOB: 1971/04/03, 45 y.o.   MRN: [id number]  HPI B Hip pain- no trauma, did fall on bed, walking feels unstable Tramadol, meloxicam partial relief Uses cane to ambulate  Has lost ~40lb, using apple cider vinegar BP doing better  Complaining of neck pain, this increases with range of motion, known recent trauma. No radiating pain down the upper extremities. No bowel or bladder dysfunction  Pain Inventory Average Pain 9 Pain Right Now 9 My pain is sharp, burning, dull, stabbing, tingling and aching  In the last 24 hours, has pain interfered with the following? General activity 8 Relation with others 8 Enjoyment of life 8 What TIME of day is your pain at its worst? all Sleep (in general) Fair  Pain is worse with: walking, bending, sitting, standing and some activites Pain improves with: medication and injections Relief from Meds: 3  Mobility walk with assistance use a cane how many minutes can you walk? 15-20  ability to climb steps?  no do you drive?  yes  Function disabled: date disabled . I need assistance with the following:  dressing and bathing  Neuro/Psych bladder control problems weakness numbness tremor trouble walking spasms dizziness confusion depression anxiety  Prior Studies Any changes since last visit?  no  Physicians involved in your care Any changes since last visit?  no   Family History  Problem Relation Age of Onset  . Colon cancer Paternal Grandfather   . Prostate cancer Paternal Grandfather   . Aneurysm Father   . Heart attack Father 20  . Coronary artery disease Father   . Colon polyps Mother   . Heart disease Mother   . Coronary artery disease Mother   . Migraines Mother   . Breast cancer Maternal Grandmother   . Colon cancer Paternal Uncle     x 2  . Stomach cancer Brother   . Liver disease      unsure who it was  . Allergies Daughter    Social History   Social  History  . Marital status: Married    Spouse name: N/A  . Number of children: N/A  . Years of education: N/A   Social History Main Topics  . Smoking status: Never Smoker  . Smokeless tobacco: Never Used  . Alcohol use No  . Drug use: No  . Sexual activity: Not Asked   Other Topics Concern  . None   Social History Narrative   Holter monitor 08/2010: PVCs and sinus tachy.   Sleep Study (02/2008): mild sleep apnea, no indication for CPAP.   Past Surgical History:  Procedure Laterality Date  . APPENDECTOMY    . BACK SURGERY    . CARDIAC CATHETERIZATION  2007   Clean Cardiac Cath ([place]), with RCA 30% narrowing likely due to catheter induced spasm in 09/02/10.  [facility] CARDIAC CATHETERIZATION  09/02/2010   mod. nonobstructive disease in the RCA and CX, tortuous LAD  . COLONOSCOPY W/ POLYPECTOMY  03/17/11   diminutive polyp  . LOOP RECORDER EXPLANT N/A 01/14/2014   Procedure: LOOP RECORDER EXPLANT;  Surgeon: Sanda Klein, MD;  Location: [facility]ATH [facility];  Service: Cardiovascular;  Laterality: N/A;  . LOOP RECORDER IMPLANT  04/06/2012   Reveal XT [id number]  . LOOP RECORDER IMPLANT N/A 04/06/2012   Procedure: LOOP RECORDER IMPLANT;  Surgeon: Sanda Klein, MD;  Location: [facility] C[facility];  Service: Cardiovascular;  Laterality: N/A;  . NM MYOCAR PERF WALL  MOTION  01/25/2008   mild anteroapical wall ischemia  . PATENT DUCTUS ARTERIOUS REPAIR     at age 22   Past Medical History:  Diagnosis Date  . Asthma   . Blood transfusion   . CAD (coronary artery disease), non obstructive on cath 2011 04/05/2012  . Coronary artery disease   . Diabetes mellitus   . Enlarged prostate   . GERD (gastroesophageal reflux disease)   . H/O syncope   . Heart disease   . Hyperlipidemia   . Hypertension   . 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 Refusal of blood transfusions as patient is Jehovah's Witness    BP 111/75 (BP Location: Right Arm, Patient Position: Sitting, Cuff Size: Large)   Pulse 98    Resp 14   SpO2 97%   Opioid Risk Score:   Fall Risk Score:  `1  Depression screen PHQ 2/9  Depression screen [facility] 2/9 06/08/2016 04/20/2016 12/25/2014  Decreased Interest 1 0 2  Down, Depressed, Hopeless 1 1 1   PHQ - 2 Score 2 1 3   Altered sleeping 3 - 1  Tired, decreased energy 1 - 1  Change in appetite 0 - 0  Feeling bad or failure about yourself  (No Data) - 2  Trouble concentrating (No Data) - 2  Moving slowly or fidgety/restless (No Data) - 2  Suicidal thoughts 0 - 0  PHQ-9 Score 6 - 11  Difficult doing work/chores Somewhat difficult - -  Some recent data might be hidden  ' 2 Review of Systems  Constitutional: Positive for diaphoresis and unexpected weight change.  HENT: Negative.   Eyes: Negative.   Respiratory: Positive for cough, shortness of breath and wheezing.   Gastrointestinal: Positive for abdominal pain, constipation, diarrhea, nausea and vomiting.  Endocrine: Negative.   Genitourinary: Positive for dysuria.       Retention  Musculoskeletal: Positive for arthralgias, back pain, myalgias and neck pain.       Spasms  Skin: Negative.   Allergic/Immunologic: Negative.   Neurological: Positive for dizziness, tremors, weakness and numbness.       Tingling  Psychiatric/Behavioral: Positive for confusion and dysphoric mood. The patient is nervous/anxious.        Objective:   Physical Exam  Constitutional: He is oriented to person, place, and time. He appears well-developed and well-nourished.  HENT:  Head: Normocephalic and atraumatic.  Eyes: Conjunctivae are normal. Pupils are equal, round, and reactive to light.  Neck:  Neck has 75% range flexion, extension, lateral bending and rotation. Spurling's negative    Musculoskeletal:       Right elbow: Tenderness found. Lateral epicondyle tenderness noted.       Left elbow: Tenderness found. Lateral epicondyle tenderness noted.       Right hip: He exhibits bony tenderness. He exhibits normal range of motion and  normal strength.       Left hip: He exhibits normal range of motion and normal strength.       Right knee: Tenderness found.       Left knee: Tenderness found.       Cervical back: He exhibits tenderness.       Thoracic back: He exhibits decreased range of motion and tenderness.       Lumbar back: He exhibits decreased range of motion and tenderness.  Neurological: He is alert and oriented to person, place, and time.  Psychiatric: He has a normal mood and affect.  Nursing note and vitals reviewed.  Assessment & Plan:  1. Widespread body pain but no joint swelling. He has symptoms consistent with fiber mouth syndrome. He states that he's had thyroid studies, which were normal. Do not suspect any type of autoimmune arthritis.  Will increase gabapentin to 600 mg 3 times a day May need to go up to 800 mg 3 times a day Consider Lyrica if this is not helpful  2. Lumbar spondylosis. He states that his co-pays for lumbar injections are cost prohibitive.  3. Right greater than left hip greater trochanteric bursitis. We will do injection today  Trochanteric bursa injection Right without ultrasound guidance  Indication Trochanteric bursitis. Exam has tenderness over the greater trochanter of the hip. Pain has not responded to conservative care such as exercise therapy and oral medications. Pain interferes with sleep or with mobility Informed consent was obtained after describing risks and benefits of the procedure with the patient these include bleeding bruising and infection. Patient has signed written consent form. Patient placed in a lateral decubitus position with the affected hip superior. Point of maximal pain was palpated marked and prepped with Betadine and entered with a needle to bone contact. Needle slightly withdrawn then 6mg  of betamethasone with 4 cc 1% lidocaine were injected. Patient tolerated procedure well. Post procedure instructions given.   . 4. Neck pain, suspect  cervical degenerative disc plus minus spondylosis. We'll check x-ray EXAM: CERVICAL SPINE - 2-3 VIEW  COMPARISON:  CT 04/04/2012.  FINDINGS: C6 and C7 difficult image. No acute soft tissue bony abnormality identified. Diffuse multilevel degenerative change. No evidence of fracture dislocation. Ligamentous ossification is noted.  IMPRESSION: No acute or focal abnormality identified. Degenerative change cervical spine.   Electronically Signed   By: Marcello Moores  Register   On: 07/07/2016 14:26  There is evidence of uncovertebral spurring, particularly at C3-4, C4-5, C5-6, but this is mild  . No foraminal stenosis noted.  Local care range of motion, would be helpful, and Center chiropractic care

## 2016-07-07 NOTE — Patient Instructions (Addendum)
Myofascial Pain Syndrome and Fibromyalgia Myofascial pain syndrome and fibromyalgia are both pain disorders. This pain may be felt mainly in your muscles.   Myofascial pain syndrome:  Always has trigger points or tender points in the muscle that will cause pain when pressed. The pain may come and go.  Usually affects your neck, upper back, and shoulder areas. The pain often radiates into your arms and hands.  Fibromyalgia:  Has muscle pains and tenderness that come and go.  Is often associated with fatigue and sleep disturbances.  Has trigger points.  Tends to be long-lasting (chronic), but is not life-threatening. Fibromyalgia and myofascial pain are not the same. However, they often occur together. If you have both conditions, each can make the other worse. Both are common and can cause enough pain and fatigue to make day-to-day activities difficult.  CAUSES  The exact causes of fibromyalgia and myofascial pain are not known. People with certain gene types may be more likely to develop fibromyalgia. Some factors can be triggers for both conditions, such as:   Spine disorders.  Arthritis.  Severe injury (trauma) and other physical stressors.  Being under a lot of stress.  A medical illness. SIGNS AND SYMPTOMS  Fibromyalgia The main symptom of fibromyalgia is widespread pain and tenderness in your muscles. This can vary over time. Pain is sometimes described as stabbing, shooting, or burning. You may have tingling or numbness, too. You may also have sleep problems and fatigue. You may wake up feeling tired and groggy (fibro fog). Other symptoms may include:   Bowel and bladder problems.  Headaches.  Visual problems.  Problems with odors and noises.  Depression or mood changes.  Painful menstrual periods (dysmenorrhea).  Dry skin or eyes. Myofascial pain syndrome Symptoms of myofascial pain syndrome include:   Tight, ropy bands of muscle.   Uncomfortable  sensations in muscular areas, such as:  Aching.  Cramping.  Burning.  Numbness.  Tingling.   Muscle weakness.  Trouble moving certain muscles freely (range of motion). DIAGNOSIS  There are no specific tests to diagnose fibromyalgia or myofascial pain syndrome. Both can be hard to diagnose because their symptoms are common in many other conditions. Your health care provider may suspect one or both of these conditions based on your symptoms and medical history. Your health care provider will also do a physical exam.  The key to diagnosing fibromyalgia is having pain, fatigue, and other symptoms for more than three months that cannot be explained by another condition.  The key to diagnosing myofascial pain syndrome is finding trigger points in muscles that are tender and cause pain elsewhere in your body (referred pain). TREATMENT  Treating fibromyalgia and myofascial pain often requires a team of health care providers. This usually starts with your primary provider and a physical therapist. You may also find it helpful to work with alternative health care providers, such as massage therapists or acupuncturists. Treatment for fibromyalgia may include medicines. This may include nonsteroidal anti-inflammatory drugs (NSAIDs), along with other medicines.  Treatment for myofascial pain may also include:  NSAIDs.  Cooling and stretching of muscles.  Trigger point injections.  Sound wave (ultrasound) treatments to stimulate muscles. HOME CARE INSTRUCTIONS   Take medicines only as directed by your health care provider.  Exercise as directed by your health care provider or physical therapist.  Try to avoid stressful situations.  Practice relaxation techniques to control your stress. You may want to try:  Biofeedback.  Visual imagery.  Hypnosis.  Muscle relaxation.  Yoga.  Meditation.  Talk to your health care provider about alternative treatments, such as acupuncture or  massage treatment.  Maintain a healthy lifestyle. This includes eating a healthy diet and getting enough sleep.  Consider joining a support group.  Do not do activities that stress or strain your muscles. That includes repetitive motions and heavy lifting. SEEK MEDICAL CARE IF:   You have new symptoms.  Your symptoms get worse.  You have side effects from your medicines.  You have trouble sleeping.  Your condition is causing depression or anxiety. FOR MORE INFORMATION   National Fibromyalgia Association: http://www.fmaware.orgwww.fmaware.Startup: http://www.arthritis.orgwww.arthritis.org  American Chronic Pain Association: StreetWrestling.at.https://stevens.biz/   This information is not intended to replace advice given to you by your health care provider. Make sure you discuss any questions you have with your health care provider.   Document Released: 09/05/2005 Document Revised: 09/26/2014 Document Reviewed: 06/11/2014 Elsevier Interactive Patient Education 2016 [employer]   Please take gabapentin 600 mg 3 times per day

## 2016-07-08 DIAGNOSIS — M5459 Other low back pain: Secondary | ICD-10-CM | POA: Insufficient documentation

## 2016-07-08 DIAGNOSIS — M542 Cervicalgia: Secondary | ICD-10-CM | POA: Insufficient documentation

## 2016-07-08 DIAGNOSIS — M797 Fibromyalgia: Secondary | ICD-10-CM | POA: Insufficient documentation

## 2016-07-08 DIAGNOSIS — M545 Low back pain: Secondary | ICD-10-CM | POA: Insufficient documentation

## 2016-07-08 DIAGNOSIS — M47812 Spondylosis without myelopathy or radiculopathy, cervical region: Secondary | ICD-10-CM | POA: Insufficient documentation

## 2016-07-14 ENCOUNTER — Telehealth: Payer: Self-pay | Admitting: *Deleted

## 2016-07-14 ENCOUNTER — Other Ambulatory Visit: Payer: Self-pay | Admitting: Physical Medicine & Rehabilitation

## 2016-07-14 ENCOUNTER — Other Ambulatory Visit: Payer: Self-pay | Admitting: Family

## 2016-07-14 NOTE — Telephone Encounter (Signed)
Mr [facility] pharmacy is requesting refills on his tramadol and tizanidine.  You only mentioned gabapentin in you office note.  Do you want to refill these?

## 2016-07-14 NOTE — Telephone Encounter (Signed)
Please advise. Only a 2 week supply was rx'd 3 months ago.  Last OV: 06/08/16 Last filled: 04/11/16, #14, 0 RF Sig: Take 1 tablet by mouth daily

## 2016-07-14 NOTE — Telephone Encounter (Signed)
Okay to fill these as well

## 2016-07-15 ENCOUNTER — Telehealth: Payer: Self-pay

## 2016-07-15 NOTE — Telephone Encounter (Signed)
Patient has been notified of Rx has been phoned in

## 2016-07-15 NOTE — Telephone Encounter (Signed)
Tizanidine already order. I  Phoned in Tramadol 50 mg po q6h with no additional refills per Dr. Letta Pate request.

## 2016-07-18 ENCOUNTER — Other Ambulatory Visit: Payer: Self-pay | Admitting: *Deleted

## 2016-07-18 MED ORDER — TRAMADOL HCL 50 MG PO TABS: 50.0000 mg | ORAL_TABLET | Freq: Four times a day (QID) | ORAL | 0 refills | Status: DC | PRN

## 2016-07-19 ENCOUNTER — Encounter: Payer: Self-pay | Admitting: Cardiovascular Disease

## 2016-07-19 ENCOUNTER — Ambulatory Visit (INDEPENDENT_AMBULATORY_CARE_PROVIDER_SITE_OTHER): Payer: Medicare Other | Admitting: Cardiovascular Disease

## 2016-07-19 VITALS — BP 112/70 | HR 88 | Ht 68.0 in | Wt 195.4 lb

## 2016-07-19 DIAGNOSIS — G8929 Other chronic pain: Secondary | ICD-10-CM

## 2016-07-19 DIAGNOSIS — I10 Essential (primary) hypertension: Secondary | ICD-10-CM | POA: Diagnosis not present

## 2016-07-19 DIAGNOSIS — R55 Syncope and collapse: Secondary | ICD-10-CM

## 2016-07-19 DIAGNOSIS — Z9889 Other specified postprocedural states: Secondary | ICD-10-CM

## 2016-07-19 DIAGNOSIS — Z8249 Family history of ischemic heart disease and other diseases of the circulatory system: Secondary | ICD-10-CM

## 2016-07-19 DIAGNOSIS — Z8774 Personal history of (corrected) congenital malformations of heart and circulatory system: Secondary | ICD-10-CM

## 2016-07-19 DIAGNOSIS — I2510 Atherosclerotic heart disease of native coronary artery without angina pectoris: Secondary | ICD-10-CM

## 2016-07-19 DIAGNOSIS — M545 Low back pain: Secondary | ICD-10-CM

## 2016-07-19 MED ORDER — LISINOPRIL 2.5 MG PO TABS: 2.5000 mg | ORAL_TABLET | Freq: Every day | ORAL | 3 refills | Status: DC

## 2016-07-19 NOTE — Patient Instructions (Signed)
If your blood pressure is consistently normal, it is okay to cut the Lisinopril if half to 2.5 mg daily.   Dr Sallyanne Kuster recommends that you schedule a follow-up appointment in 6 months. You will receive a reminder letter in the mail two months in advance. If you don't receive a letter, please call our office to schedule the follow-up appointment.  If you need a refill on your cardiac medications before your next appointment, please call your pharmacy.

## 2016-07-19 NOTE — Progress Notes (Signed)
Patient ID: Cody Raymond, male   DOB: Sep 22, 1970, 45 y.o.   MRN: [id number]    Cardiology Office Note    Date:  07/23/2016   ID:  Cody Raymond, DOB 02/24/1971, MRN [id number]  PCP:  Nance Pear., NP  Cardiologist: Virl Axe, M.D. (EP);  Sanda Klein, MD   Chief Complaint  Patient presents with  . Follow-up    3 months; chest tightness,pressure. lighthead; randomly.     History of Present Illness:  Cody Raymond is a 45 y.o. male with a history of unexplained syncope, family history of hypertrophic cardiomyopathy, personal history of repaired patent ductus arteriosus, minor coronary atherosclerosis by previous cardiac catheterization, mild hypertension and hyperlipidemia and type 2 diabetes mellitus.  Is not having any new cardiovascular complaints, although he has a multitude of orthopedic problems. He did not have the cardiac MRI due to the cost. He cannot afford the upfront $500 cost which was requested from him. He has not had syncope or near syncope. He has lost substantial weight. He is no longer obese, now has a BMI that is just under 30. His A1c is now excellent at 5.2%. His event monitor did not show any serious arrhythmia.  His EKG remains a little unusual but no longer has a pattern suggestive of Brugada syndrome. Shows normal sinus rhythm with tall R wave in V1 that could represent right ventricular hypertrophy and a nonspecific less than 1 mm ST segment elevation in leads 1 and aVL. Looking back over older ECGs this pattern has previously been intermittently present.  He continues to have sharp stinging fleeting chest pain in the left chest in the precordial area and around the scar of his previous chest surgery. The area is tender to touch and the location of the pain varies from episodes episode.  He had patent ductus arteriosus repair at age [age]. He had a cardiac catheterization 2011 that showed minor coronary atherosclerosis and raised some suspicion for  coronary artery spasm. He had a syncopal event in 2013, but then had an implantable loop recorder that never showed any arrhythmia. Syncope has not recurred. His mother has hypertrophic cardiomyopathy and has undergone a septal myectomy surgery in [place]. 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    Past Medical History:  Diagnosis Date  . Asthma   . Blood transfusion   . CAD (coronary artery disease), non obstructive on cath 2011 04/05/2012  . Coronary artery disease   . Diabetes mellitus   . Enlarged prostate   . GERD (gastroesophageal reflux disease)   . H/O syncope   . Heart disease   . History of repair of patent ductus arteriosus 07/23/2016  . Hyperlipidemia   . Hypertension   . 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 Refusal of blood transfusions as patient is Jehovah's Witness     Past Surgical History:  Procedure Laterality Date  . APPENDECTOMY    . BACK SURGERY    . CARDIAC CATHETERIZATION  2007   Clean Cardiac Cath ([facility]), with RCA 30% narrowing likely due to catheter induced spasm in 09/02/10.  Marland Kitchen CARDIAC CATHETERIZATION  09/02/2010   mod. nonobstructive disease in the RCA and CX, tortuous LAD  . COLONOSCOPY W/ POLYPECTOMY  03/17/11   diminutive polyp  . LOOP RECORDER EXPLANT N/A 01/14/2014   Procedure: LOOP RECORDER EXPLANT;  Surgeon: Sanda Klein, MD;  Location: [facility];  Service: Cardiovascular;  Laterality: N/A;  . LOOP RECORDER IMPLANT  04/06/2012   Reveal XT [id number]  . LOOP RECORDER IMPLANT N/A 04/06/2012   Procedure: LOOP RECORDER IMPLANT;  Surgeon: Sanda Klein, MD;  Location: [facility];  Service: Cardiovascular;  Laterality: N/A;  . NM MYOCAR PERF WALL MOTION  01/25/2008   mild anteroapical wall ischemia  . PATENT DUCTUS ARTERIOUS REPAIR     at age 2    Current Medications: Outpatient Medications Prior to Visit  Medication Sig Dispense Refill  . albuterol (PROVENTIL) (2.5 MG/3ML) 0.083% nebulizer solution  Use 1 vial via nebulizer  every 6 hours as needed for wheezing or shortness of  breath 600 mL 1  . aspirin 325 MG tablet Take 325 mg by mouth daily.    . cetirizine (ZYRTEC) 10 MG tablet Take 10 mg by mouth daily.    . diclofenac sodium (VOLTAREN) 1 % GEL Apply 2 g topically 4 (four) times daily. 2 g 0  . ezetimibe (ZETIA) 10 MG tablet Take 1 tablet (10 mg total) by mouth daily. 30 tablet 5  . fenofibrate (TRICOR) 145 MG tablet Take 1 tablet by mouth  daily 90 tablet 1  . fish oil-omega-3 fatty acids 1000 MG capsule Take 2 g by mouth daily.     . Fluticasone-Salmeterol (ADVAIR) 100-50 MCG/DOSE AEPB Inhale 1 puff into the lungs 2 (two) times daily. 1 each 3  . gabapentin (NEURONTIN) 300 MG capsule TAKE 1 CAPSULE BY MOUTH AT  6AM, 11:30AM AND 4:30PM AND TAKE 3 CAPSULES AT 10:00PM 540 capsule 1  . lansoprazole (PREVACID) 15 MG capsule Take 15 mg by mouth daily.    Marland Kitchen loperamide (IMODIUM) 2 MG capsule Take 1 capsule (2 mg total) by mouth as needed for diarrhea or loose stools. 12 capsule 0  . LORazepam (ATIVAN) 1 MG tablet Take 1 tablet (1 mg total) by mouth once. Take 1 tablet (1 mg total) by mouth once 30 minutes-1 hour prior to cardiac MRI. 1 tablet 0  . meloxicam (MOBIC) 7.5 MG tablet TAKE 1 TABLET BY MOUTH  DAILY 14 tablet 2  . metFORMIN (GLUCOPHAGE-XR) 500 MG 24 hr tablet Take 2 tablets by mouth  daily with breakfast 180 tablet 1  . montelukast (SINGULAIR) 10 MG tablet Take 1 tablet by mouth at  bedtime 90 tablet 1  . Multiple Vitamin (MULTIVITAMIN WITH MINERALS) TABS tablet Take 1 tablet by mouth daily.    . naproxen sodium (ALEVE) 220 MG tablet Take 440 mg by mouth 2 (two) times daily. Take 440 mg in the morning and 220 mg in the evening.    . nitroGLYCERIN (NITROSTAT) 0.4 MG SL tablet Place 1 tablet (0.4 mg total) under the tongue every 5 (five) minutes as needed for chest pain. 30 tablet 1  . ondansetron (ZOFRAN ODT) 4 MG disintegrating tablet Take 1 tablet (4 mg total) by mouth every 8  (eight) hours as needed for nausea or vomiting. 20 tablet 0  . ONETOUCH VERIO test strip Check blood sugar 3 times  daily 300 each 1  . ramelteon (ROZEREM) 8 MG tablet Take 1 tablet (8 mg total) by mouth at bedtime. 30 tablet 5  . rosuvastatin (CRESTOR) 40 MG tablet Take 1 tablet (40 mg total) by mouth daily. 30 tablet 5  . sertraline (ZOLOFT) 50 MG tablet Take 1 tablet by mouth  daily 90 tablet 1  . tamsulosin (FLOMAX) 0.4 MG CAPS capsule Take 1 capsule by mouth  daily 90 capsule 1  . tiZANidine (ZANAFLEX) 2 MG tablet TAKE 1 TABLET BY MOUTH  EVERY 6 HOURS AS NEEDED FOR MUSCLE SPASM(S) 270 tablet 0  . traMADol (ULTRAM) 50 MG tablet Take 1 tablet (50 mg total) by mouth every 6 (six) hours as needed for severe pain. 120 tablet 0  . traZODone (DESYREL) 100 MG tablet Take 1 tablet by mouth at  bedtime 90 tablet 1  . clonazePAM (KLONOPIN) 1 MG tablet Take 1 tablet (1 mg total) by mouth daily. 30 tablet 0  . ketoconazole (NIZORAL) 2 % shampoo Apply topically daily as  directed 360 mL 1  . lisinopril (PRINIVIL,ZESTRIL) 5 MG tablet Take 1 tablet by mouth  daily 90 tablet 1  . zolpidem (AMBIEN) 10 MG tablet Take 1 tablet (10 mg total) by mouth at bedtime as needed for sleep. 30 tablet 0   No facility-administered medications prior to visit.      Allergies:   Benadryl [diphenhydramine hcl] and Red blood cells   Social History   Social History  . Marital status: Married    Spouse name: N/A  . Number of children: N/A  . Years of education: N/A   Social History Main Topics  . Smoking status: Never Smoker  . Smokeless tobacco: Never Used  . Alcohol use No  . Drug use: No  . Sexual activity: Not Asked   Other Topics Concern  . None   Social History Narrative   Holter monitor 08/2010: PVCs and sinus tachy.   Sleep Study (02/2008): mild sleep apnea, no indication for CPAP.     Family History:  The patient's family history includes Allergies in his daughter; Aneurysm in his father; Breast  cancer in his maternal grandmother; Colon cancer in his paternal grandfather and paternal uncle; Colon polyps in his mother; Coronary artery disease in his father and mother; Heart attack (age of onset: 40) in his father; Heart disease in his mother; Migraines in his mother; Prostate cancer in his paternal grandfather; Stomach cancer in his brother.   ROS:   Please see the history of present illness.    ROS All other systems reviewed and are negative.   PHYSICAL EXAM:   VS:  BP 112/70   Pulse 88   Ht 5\' 8"  (1.727 m)   Wt 195 lb 6.4 oz (88.6 kg)   BMI 29.71 kg/m    GEN: Well nourished, well developed, in no acute distress  HEENT: normal  Neck: no JVD, carotid bruits, or masses Cardiac: RRR; no murmurs, rubs, or gallops,no edema , large left lateral thoracotomy scar with some keloid formation Respiratory:  clear to auscultation bilaterally, normal work of breathing GI: soft, nontender, nondistended, + BS MS: no deformity or atrophy  Skin: warm and dry, no rash Neuro:  Alert and Oriented x 3, Strength and sensation are intact Psych: euthymic mood, full affect  Wt Readings from Last 3 Encounters:  07/19/16 195 lb 6.4 oz (88.6 kg)  06/08/16 194 lb 12.8 oz (88.4 kg)  04/20/16 198 lb 9.6 oz (90.1 kg)      Studies/Labs Reviewed:   EKG:  EKG is ordered today.  The ekg ordered today demonstrates Normal sinus rhythm, RSR prime pattern in lead V1 but with ST segments at isoelectric baselineIn the precordial leads, subtle, less than 1 mm ST segment elevation in leads 1 and aVL. QTC normal Recent Labs: 01/08/2016: TSH 0.95 01/20/2016: ALT 53; Hemoglobin 13.5; Platelets 234 06/08/2016: BUN 13; Creatinine, Ser 0.95; Potassium 4.0; Sodium 138   Lipid Panel    Component Value Date/Time   CHOL 186 06/08/2016 0923  TRIG 211.0 (H) 06/08/2016 0923   HDL 36.40 (L) 06/08/2016 0923   CHOLHDL 5 06/08/2016 0923   VLDL 42.2 (H) 06/08/2016 0923   LDLCALC 122 (H) 04/17/2015 0940   LDLDIRECT 102.0  06/08/2016 0923    ASSESSMENT:     1. Coronary artery disease involving native coronary artery of native heart without angina pectoris   2. Essential hypertension   3. Syncope, unspecified syncope type   4. Family history of hypertrophic cardiomyopathy   5. Chronic bilateral low back pain, with sciatica presence unspecified   6. History of repair of patent ductus arteriosus       PLAN:  In order of problems listed above:  1. CAD/coronary spasm: His current chest pain symptoms do not sound compatible with either fixed coronary artery disease or coronary vasospasm. The focus is on treatment of coronary risk factors which for the most part seems to be well achieved. He is congratulated on weight loss and would benefit from more physical exercise (limited by his back pain) 2. HTN: His blood pressure is excellent. In the past we have had to cut back on his dose of lisinopril to avoid symptomatic hypotension. If she loses more weight this may be necessary. 3. Syncope: He had one episode of syncope years ago that remains unexplained, even with an implantable loop recorder 4. Family history of hypertrophic cardiomyopathy: Current ECG not compatible with Brugada syndrome. Unable to afford MRI.  It is possible that his ECG changes are related to some form of cardiomyopathy, but this is not evident on echo and he has been unable to have a cardiac MRI. We'll continue to explore options for advanced imaging based on his insurance coverage. 5. Back pain: This is his major functional limiting factor. 6. Hx PDA repair: No evidence of sequelae from this.   Medication Adjustments/Labs and Tests Ordered: Current medicines are reviewed at length with the patient today.  Concerns regarding medicines are outlined above.  Medication changes, Labs and Tests ordered today are listed in the Patient Instructions below. Patient Instructions  If your blood pressure is consistently normal, it is okay to cut the  Lisinopril if half to 2.5 mg daily.   Dr Sallyanne Kuster recommends that you schedule a follow-up appointment in 6 months. You will receive a reminder letter in the mail two months in advance. If you don't receive a letter, please call our office to schedule the follow-up appointment.  If you need a refill on your cardiac medications before your next appointment, please call your pharmacy.    Signed, Sanda Klein, MD  07/23/2016 7:19 PM    [address], [place], [place]  [address] Phone: [phone]; Fax: [phone]

## 2016-07-20 ENCOUNTER — Other Ambulatory Visit: Payer: Self-pay

## 2016-07-20 ENCOUNTER — Other Ambulatory Visit: Payer: Self-pay | Admitting: Family

## 2016-07-20 MED ORDER — ZOLPIDEM TARTRATE 10 MG PO TABS: 10.0000 mg | ORAL_TABLET | Freq: Every evening | ORAL | 0 refills | Status: DC | PRN

## 2016-07-20 MED ORDER — CLONAZEPAM 1 MG PO TABS: 1.0000 mg | ORAL_TABLET | Freq: Every day | ORAL | 0 refills | Status: DC

## 2016-07-20 NOTE — Telephone Encounter (Signed)
Rx was faxed to the Pharmacy on 07/20/2016.

## 2016-07-20 NOTE — Telephone Encounter (Signed)
Received refill request from [facility] for clonazepam-zolpidem.  Last Zolpidem: 10/05 2017, #30, x no refills. Last Clonazepam: 06/23/2016, #30, x no refills. UDS:  06/08/2016. Moderate risk.  Will only send a 30 day supply to local pharmacy. Rx is printed and forwarded to PCP for signature.

## 2016-07-23 ENCOUNTER — Encounter: Payer: Self-pay | Admitting: Cardiovascular Disease

## 2016-07-23 DIAGNOSIS — Z8774 Personal history of (corrected) congenital malformations of heart and circulatory system: Secondary | ICD-10-CM | POA: Insufficient documentation

## 2016-07-23 HISTORY — DX: Personal history of (corrected) congenital malformations of heart and circulatory system: Z87.74

## 2016-07-30 ENCOUNTER — Other Ambulatory Visit: Payer: Self-pay | Admitting: Family

## 2016-08-01 NOTE — Telephone Encounter (Signed)
Pt scheduled on 09/07/16 for evaluation. I have refilled Rx for Meloxicam. TL/CMA

## 2016-08-04 ENCOUNTER — Encounter: Payer: Medicare Other | Attending: Physical Medicine & Rehabilitation

## 2016-08-04 ENCOUNTER — Ambulatory Visit (HOSPITAL_BASED_OUTPATIENT_CLINIC_OR_DEPARTMENT_OTHER): Payer: Medicare Other | Admitting: Physical Medicine & Rehabilitation

## 2016-08-04 ENCOUNTER — Encounter: Payer: Self-pay | Admitting: Physical Medicine & Rehabilitation

## 2016-08-04 VITALS — BP 103/71 | HR 86 | Resp 14

## 2016-08-04 DIAGNOSIS — K219 Gastro-esophageal reflux disease without esophagitis: Secondary | ICD-10-CM | POA: Diagnosis not present

## 2016-08-04 DIAGNOSIS — M542 Cervicalgia: Secondary | ICD-10-CM | POA: Diagnosis not present

## 2016-08-04 DIAGNOSIS — M545 Low back pain: Secondary | ICD-10-CM | POA: Insufficient documentation

## 2016-08-04 DIAGNOSIS — M961 Postlaminectomy syndrome, not elsewhere classified: Secondary | ICD-10-CM | POA: Diagnosis not present

## 2016-08-04 DIAGNOSIS — I2510 Atherosclerotic heart disease of native coronary artery without angina pectoris: Secondary | ICD-10-CM | POA: Diagnosis not present

## 2016-08-04 DIAGNOSIS — Z803 Family history of malignant neoplasm of breast: Secondary | ICD-10-CM | POA: Diagnosis not present

## 2016-08-04 DIAGNOSIS — M7061 Trochanteric bursitis, right hip: Secondary | ICD-10-CM | POA: Diagnosis not present

## 2016-08-04 DIAGNOSIS — M47812 Spondylosis without myelopathy or radiculopathy, cervical region: Secondary | ICD-10-CM | POA: Insufficient documentation

## 2016-08-04 DIAGNOSIS — Z8249 Family history of ischemic heart disease and other diseases of the circulatory system: Secondary | ICD-10-CM | POA: Insufficient documentation

## 2016-08-04 DIAGNOSIS — I10 Essential (primary) hypertension: Secondary | ICD-10-CM | POA: Insufficient documentation

## 2016-08-04 DIAGNOSIS — Z800 Family history of malignant neoplasm of digestive organs: Secondary | ICD-10-CM | POA: Insufficient documentation

## 2016-08-04 DIAGNOSIS — M797 Fibromyalgia: Secondary | ICD-10-CM | POA: Diagnosis not present

## 2016-08-04 DIAGNOSIS — E785 Hyperlipidemia, unspecified: Secondary | ICD-10-CM | POA: Insufficient documentation

## 2016-08-04 DIAGNOSIS — E119 Type 2 diabetes mellitus without complications: Secondary | ICD-10-CM | POA: Diagnosis not present

## 2016-08-04 MED ORDER — TRAMADOL HCL 50 MG PO TABS: 50.0000 mg | ORAL_TABLET | Freq: Four times a day (QID) | ORAL | 1 refills | Status: DC | PRN

## 2016-08-04 NOTE — Patient Instructions (Addendum)
Dr. [facility] [address] [place], [place] [id number] [phone]  These talk to your primary care physician for a psychiatry referral to help with the management of depression and anxiety. You're on quite a few medicines that may be optimized to help with some of your symptoms.

## 2016-08-04 NOTE — Progress Notes (Signed)
Subjective:    Patient ID: Cody Raymond, male    DOB: April 21, 1971, 45 y.o.   MRN: [id number]  HPI   Chief complaint is pain all over the body  45 year old male with history of chronic low back pain, right L4 chronic radiculopathy. Right L4-L5 microdiscectomy decompressive laminectomy and foraminotomy. 06/03/2011 He has undergone lumbar decompression without any improvements History of chronic right Achilles tendon pain. This is doing a bit better. He has also had right hip pain some relief after right hip greater trochanter injection.  Other past history significant for depression and anxiety. On multiple anxiolytics, antidepressants, trazodone, Zoloft, Ativan, Klonopin, managed by PCP Pain Inventory Average Pain 9 Pain Right Now 8 My pain is constant, sharp, burning, dull, stabbing, tingling and aching  In the last 24 hours, has pain interfered with the following? General activity 6 Relation with others 6 Enjoyment of life 5 What TIME of day is your pain at its worst? all Sleep (in general) Fair  Pain is worse with: walking, bending, sitting and standing Pain improves with: rest, heat/ice, medication and injections Relief from Meds: 4  Mobility use a cane ability to climb steps?  no do you drive?  yes Do you have any goals in this area?  yes  Function disabled: date disabled n/a I need assistance with the following:  dressing and bathing  Neuro/Psych weakness numbness tingling spasms dizziness confusion depression anxiety  Prior Studies Any changes since last visit?  no  Physicians involved in your care Any changes since last visit?  no   Family History  Problem Relation Age of Onset  . Colon cancer Paternal Grandfather   . Prostate cancer Paternal Grandfather   . Aneurysm Father   . Heart attack Father 56  . Coronary artery disease Father   . Colon polyps Mother   . Heart disease Mother   . Coronary artery disease Mother   . Migraines Mother     . Breast cancer Maternal Grandmother   . Colon cancer Paternal Uncle     x 2  . Stomach cancer Brother   . Liver disease      unsure who it was  . Allergies Daughter    Social History   Social History  . Marital status: Married    Spouse name: N/A  . Number of children: N/A  . Years of education: N/A   Social History Main Topics  . Smoking status: Never Smoker  . Smokeless tobacco: Never Used  . Alcohol use No  . Drug use: No  . Sexual activity: Not Asked   Other Topics Concern  . None   Social History Narrative   Holter monitor 08/2010: PVCs and sinus tachy.   Sleep Study (02/2008): mild sleep apnea, no indication for CPAP.   Past Surgical History:  Procedure Laterality Date  . APPENDECTOMY    . BACK SURGERY    . CARDIAC CATHETERIZATION  2007   Clean Cardiac Cath ([facility]), with RCA 30% narrowing likely due to catheter induced spasm in 09/02/10.  Marland Kitchen CARDIAC CATHETERIZATION  09/02/2010   mod. nonobstructive disease in the RCA and CX, tortuous LAD  . COLONOSCOPY W/ POLYPECTOMY  03/17/11   diminutive polyp  . LOOP RECORDER EXPLANT N/A 01/14/2014   Procedure: LOOP RECORDER EXPLANT;  Surgeon: Sanda Klein, MD;  Location: [facility];  Service: Cardiovascular;  Laterality: N/A;  . LOOP RECORDER IMPLANT  04/06/2012   Reveal XT [id number]  . LOOP RECORDER IMPLANT N/A 04/06/2012  Procedure: LOOP RECORDER IMPLANT;  Surgeon: Sanda Klein, MD;  Location: [facility];  Service: Cardiovascular;  Laterality: N/A;  . NM MYOCAR PERF WALL MOTION  01/25/2008   mild anteroapical wall ischemia  . PATENT DUCTUS ARTERIOUS REPAIR     at age 50   Past Medical History:  Diagnosis Date  . Asthma   . Blood transfusion   . CAD (coronary artery disease), non obstructive on cath 2011 04/05/2012  . Coronary artery disease   . Diabetes mellitus   . Enlarged prostate   . GERD (gastroesophageal reflux disease)   . H/O syncope   . Heart disease   . History of repair of patent ductus  arteriosus 07/23/2016  . Hyperlipidemia   . Hypertension   . 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 Refusal of blood transfusions as patient is Jehovah's Witness    BP 103/71   Pulse 86   Resp 14   SpO2 96%   Opioid Risk Score:   Fall Risk Score:  `1  Depression screen PHQ 2/9  Depression screen [facility] 2/9 06/08/2016 04/20/2016 12/25/2014  Decreased Interest 1 0 2  Down, Depressed, Hopeless 1 1 1   PHQ - 2 Score 2 1 3   Altered sleeping 3 - 1  Tired, decreased energy 1 - 1  Change in appetite 0 - 0  Feeling bad or failure about yourself  (No Data) - 2  Trouble concentrating (No Data) - 2  Moving slowly or fidgety/restless (No Data) - 2  Suicidal thoughts 0 - 0  PHQ-9 Score 6 - 11  Difficult doing work/chores Somewhat difficult - -  Some recent data might be hidden    Review of Systems  Constitutional: Positive for unexpected weight change.  HENT: Negative.   Eyes: Negative.   Respiratory: Positive for cough, shortness of breath and wheezing.   Cardiovascular: Negative.   Gastrointestinal: Positive for abdominal pain, constipation, diarrhea, nausea and vomiting.  Endocrine:       High blood sugar  Genitourinary: Negative.   Musculoskeletal: Negative.   Skin: Negative.   Allergic/Immunologic: Negative.   Neurological: Negative.   Hematological: Negative.   Psychiatric/Behavioral: Negative.   All other systems reviewed and are negative.      Objective:   Physical Exam  Constitutional: He is oriented to person, place, and time. He appears well-developed and well-nourished.  HENT:  Head: Normocephalic and atraumatic.  Eyes: Conjunctivae and EOM are normal. Pupils are equal, round, and reactive to light.  Neurological: He is alert and oriented to person, place, and time.  Psychiatric: He has a normal mood and affect.  Nursing note and vitals reviewed.  Gait is using a straight cane. No evidence of toe drag or knee instability. His tennis palpation along the cervical,  thoracic and lumbar paraspinals as well as the periscapular area, upper trapezius area, right hip.       Assessment & Plan:  1. Widespread body pain, either representing myofascial pain syndrome versus fibromyalgia syndrome. Increase gabapentin to 2400mg  per day If this fails, would wean down and switch to Lyrica  Some agitation, + anxiety, some sweating at night Reduce trazodone or reduce Zoloft Anxiety and depression is affecting his chronic pain and may benefit from referral to psychiatry Return to clinic in 2 months Continue tramadol 50 mg 4 times a day would not boost dose secondary to other SSRIs  2. Right greater trochanteric bursitis. Recommend repeat injection in 2 months. Continue stretching, although patient not very compliant  3. Cervicalgia. Multilevel mild cervical spondylosis without any foraminal stenosis. Recommend chiropractic evaluation

## 2016-08-05 ENCOUNTER — Telehealth: Payer: Self-pay | Admitting: Family

## 2016-08-05 NOTE — Telephone Encounter (Signed)
See my chart message

## 2016-08-22 ENCOUNTER — Ambulatory Visit (HOSPITAL_BASED_OUTPATIENT_CLINIC_OR_DEPARTMENT_OTHER)
Admission: RE | Admit: 2016-08-22 | Discharge: 2016-08-22 | Disposition: A | Discharge status: Home / Self Care | Payer: Medicare Other | Source: Ambulatory Visit | Attending: Family | Admitting: Family

## 2016-08-22 ENCOUNTER — Ambulatory Visit (INDEPENDENT_AMBULATORY_CARE_PROVIDER_SITE_OTHER): Payer: Medicare Other | Admitting: Family

## 2016-08-22 ENCOUNTER — Encounter: Payer: Self-pay | Admitting: Family

## 2016-08-22 VITALS — BP 114/72 | HR 94 | Temp 98.3°F | Resp 16 | Ht 68.0 in | Wt 193.4 lb

## 2016-08-22 DIAGNOSIS — J019 Acute sinusitis, unspecified: Secondary | ICD-10-CM | POA: Diagnosis not present

## 2016-08-22 DIAGNOSIS — R062 Wheezing: Secondary | ICD-10-CM | POA: Insufficient documentation

## 2016-08-22 DIAGNOSIS — J45901 Unspecified asthma with (acute) exacerbation: Secondary | ICD-10-CM | POA: Diagnosis not present

## 2016-08-22 MED ORDER — AMOXICILLIN-POT CLAVULANATE 875-125 MG PO TABS: 1.0000 | ORAL_TABLET | Freq: Two times a day (BID) | ORAL | 0 refills | Status: DC

## 2016-08-22 MED ORDER — METHYLPREDNISOLONE SODIUM SUCC 125 MG IJ SOLR
125.0000 mg | Freq: Once | INTRAMUSCULAR | Status: AC
  Administered 2016-08-22: 125 mg via INTRAMUSCULAR

## 2016-08-22 MED ORDER — PREDNISONE 10 MG PO TABS: ORAL_TABLET | ORAL | 0 refills | Status: AC

## 2016-08-22 NOTE — Progress Notes (Signed)
Pre visit review using our clinic review tool, if applicable. No additional management support is needed unless otherwise documented below in the visit note. 

## 2016-08-22 NOTE — Patient Instructions (Signed)
Please complete chest x ray on the first floor. Begin augmentin and prednisone taper. Check sugar daily while on prednisone. Call if sugar >300. Use albuterol every 6 hours as needed. Call if new/worsening symptoms or if symptoms are not improved in 3 days.

## 2016-08-22 NOTE — Progress Notes (Signed)
Subjective:    Patient ID: Cody Raymond, male    DOB: 06-16-1971, 45 y.o.   MRN: [id number]  HPI  Cody Raymond is a 46 yr old male with hx of asthma who presents today with c/o productive cough, wheezing x 6 days.  Has been using mucinex and vit C with no improvement.  Denies associated fever. Reports mucous has been blood tinged.  He does have some sinus pressure.  Nasal drainage is bloody.  Using albuterol without improvement in the his wheezing or tightness of breathing.    Wt Readings from Last 3 Encounters:  08/22/16 193 lb 6.4 oz (87.7 kg)  07/19/16 195 lb 6.4 oz (88.6 kg)  06/08/16 194 lb 12.8 oz (88.4 kg)    Review of Systems    see HPI  Past Medical History:  Diagnosis Date  . Asthma   . Blood transfusion   . CAD (coronary artery disease), non obstructive on cath 2011 04/05/2012  . Coronary artery disease   . Diabetes mellitus   . Enlarged prostate   . GERD (gastroesophageal reflux disease)   . H/O syncope   . Heart disease   . History of repair of patent ductus arteriosus 07/23/2016  . Hyperlipidemia   . Hypertension   . 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 Refusal of blood transfusions as patient is Jehov[employer]'s Witness      Social History   Social History  . Marital status: Married    Spouse name: N/A  . Number of children: N/A  . Years of education: N/A   Occupational History  . Not on file.   Social History Main Topics  . Smoking status: Never Smoker  . Smokeless tobacco: Never Used  . Alcohol use No  . Drug use: No  . Sexual activity: Not on file   Other Topics Concern  . Not on file   Social History Narrative   Holter monitor 08/2010: PVCs and sinus tachy.   Sleep Study (02/2008): mild sleep apnea, no indication for CPAP.    Past Surgical History:  Procedure Laterality Date  . APPENDECTOMY    . BACK SURGERY    . CARDIAC CATHETERIZATION  2007   Clean Cardiac Cath ([place]), with RCA 30% narrowing likely due to catheter  induced spasm in 09/02/10.  Marland Kitchen CARDIAC CATHETERIZATION  09/02/2010   mod. nonobstructive disease in the RCA and CX, tortuous LAD  . COLONOSCOPY W/ POLYPECTOMY  03/17/11   diminutive polyp  . LOOP RECORDER EXPLANT N/A 01/14/2014   Procedure: LOOP RECORDER EXPLANT;  Surgeon: Sanda Klein, MD;  Location: [facility];  Service: Cardiovascular;  Laterality: N/A;  . LOOP RECORDER IMPLANT  04/06/2012   Reveal XT [id number]  . LOOP RECORDER IMPLANT N/A 04/06/2012   Procedure: LOOP RECORDER IMPLANT;  Surgeon: Sanda Klein, MD;  Location: [place] CATH [facility];  Service: Cardiovascular;  Laterality: N/A;  . NM MYOCAR PERF WALL MOTION  01/25/2008   mild anteroapical wall ischemia  . PATENT DUCTUS ARTERIOUS REPAIR     at age 52    Family History  Problem Relation Age of Onset  . Colon cancer Paternal Grandfather   . Prostate cancer Paternal Grandfather   . Aneurysm Father   . Heart attack Father 58  . Coronary artery disease Father   . Colon polyps Mother   . Heart disease Mother   . Coronary artery disease Mother   . Migraines Mother   . Breast cancer Maternal Grandmother   .  Colon cancer Paternal Uncle     x 2  . Stomach cancer Brother   . Liver disease      unsure who it was  . Allergies Daughter     Allergies  Allergen Reactions  . Benadryl [Diphenhydramine Hcl]     "drives me nuts"  . Red Blood Cells     PT. REFUSES ANY BLOOD PRODUCTS - [employer]EH[employer] WITNESS.    Current Outpatient Prescriptions on File Prior to Visit  Medication Sig Dispense Refill  . albuterol (PROVENTIL) (2.5 MG/3ML) 0.083% nebulizer solution Use 1 vial via nebulizer  every 6 hours as needed for wheezing or shortness of  breath 600 mL 1  . aspirin 325 MG tablet Take 325 mg by mouth daily.    . cetirizine (ZYRTEC) 10 MG tablet Take 10 mg by mouth daily.    . clonazePAM (KLONOPIN) 1 MG tablet Take 1 tablet (1 mg total) by mouth daily. 30 tablet 0  . diclofenac sodium (VOLTAREN) 1 % GEL Apply 2 g topically 4 (four) times daily. 2  g 0  . ezetimibe (ZETIA) 10 MG tablet Take 1 tablet (10 mg total) by mouth daily. 30 tablet 5  . fenofibrate (TRICOR) 145 MG tablet Take 1 tablet by mouth  daily 90 tablet 1  . fish oil-omega-3 fatty acids 1000 MG capsule Take 2 g by mouth daily.     . Fluticasone-Salmeterol (ADVAIR) 100-50 MCG/DOSE AEPB Inhale 1 puff into the lungs 2 (two) times daily. 1 each 3  . gabapentin (NEURONTIN) 300 MG capsule TAKE 1 CAPSULE BY MOUTH AT  6AM, 11:30AM AND 4:30PM AND TAKE 3 CAPSULES AT 10:00PM 540 capsule 1  . ketoconazole (NIZORAL) 2 % shampoo Apply topically daily. 360 mL 0  . lansoprazole (PREVACID) 15 MG capsule Take 15 mg by mouth daily.    Marland Kitchen loperamide (IMODIUM) 2 MG capsule Take 1 capsule (2 mg total) by mouth as needed for diarrhea or loose stools. 12 capsule 0  . LORazepam (ATIVAN) 1 MG tablet Take 1 tablet (1 mg total) by mouth once. Take 1 tablet (1 mg total) by mouth once 30 minutes-1 hour prior to cardiac MRI. 1 tablet 0  . meloxicam (MOBIC) 7.5 MG tablet TAKE 1 TABLET BY MOUTH  DAILY 42 tablet 0  . metFORMIN (GLUCOPHAGE-XR) 500 MG 24 hr tablet Take 2 tablets by mouth  daily with breakfast 180 tablet 1  . montelukast (SINGULAIR) 10 MG tablet Take 1 tablet by mouth at  bedtime 90 tablet 1  . Multiple Vitamin (MULTIVITAMIN WITH MINERALS) TABS tablet Take 1 tablet by mouth daily.    . naproxen sodium (ALEVE) 220 MG tablet Take 440 mg by mouth 2 (two) times daily. Take 440 mg in the morning and 220 mg in the evening.    . nitroGLYCERIN (NITROSTAT) 0.4 MG SL tablet Place 1 tablet (0.4 mg total) under the tongue every 5 (five) minutes as needed for chest pain. 30 tablet 1  . ondansetron (ZOFRAN ODT) 4 MG disintegrating tablet Take 1 tablet (4 mg total) by mouth every 8 (eight) hours as needed for nausea or vomiting. 20 tablet 0  . ONETOUCH VERIO test strip Check blood sugar 3 times  daily 300 each 1  . ramelteon (ROZEREM) 8 MG tablet Take 1 tablet (8 mg total) by mouth at bedtime. 30 tablet 5  .  rosuvastatin (CRESTOR) 40 MG tablet Take 1 tablet (40 mg total) by mouth daily. 30 tablet 5  . sertraline (ZOLOFT) 50 MG tablet Take  1 tablet by mouth  daily 90 tablet 1  . tamsulosin (FLOMAX) 0.4 MG CAPS capsule Take 1 capsule by mouth  daily 90 capsule 1  . tiZANidine (ZANAFLEX) 2 MG tablet TAKE 1 TABLET BY MOUTH  EVERY 6 HOURS AS NEEDED FOR MUSCLE SPASM(S) 270 tablet 0  . traMADol (ULTRAM) 50 MG tablet Take 1 tablet (50 mg total) by mouth every 6 (six) hours as needed for severe pain. 120 tablet 1  . traZODone (DESYREL) 100 MG tablet Take 1 tablet by mouth at  bedtime 90 tablet 1  . zolpidem (AMBIEN) 10 MG tablet Take 1 tablet (10 mg total) by mouth at bedtime as needed for sleep. 30 tablet 0  . [DISCONTINUED] niacin (NIASPAN) 1000 MG CR tablet Take 1 tablet (1,000 mg total) by mouth at bedtime. 30 tablet 6   No current facility-administered medications on file prior to visit.     BP 114/72 (BP Location: Right Arm, Cuff Size: Normal)   Pulse 94   Temp 98.3 F (36.8 C) (Oral)   Resp 16   Ht 5\' 8"  (1.727 m)   Wt 193 lb 6.4 oz (87.7 kg)   SpO2 99%   BMI 29.41 kg/m    Objective:   Physical Exam  Constitutional: He is oriented to person, place, and time. He appears well-developed and well-nourished. No distress.  HENT:  Head: Normocephalic and atraumatic.  Right Ear: Tympanic membrane and ear canal normal.  Left Ear: Tympanic membrane and ear canal normal.  Nose: Right sinus exhibits maxillary sinus tenderness and frontal sinus tenderness. Left sinus exhibits maxillary sinus tenderness and frontal sinus tenderness.  Mouth/Throat: No oropharyngeal exudate, posterior oropharyngeal edema or posterior oropharyngeal erythema.  Eyes: No scleral icterus.  Neck: Neck supple. No thyromegaly present.  Cardiovascular: Normal rate and regular rhythm.   No murmur heard. Pulmonary/Chest: Effort normal. No respiratory distress. He has wheezes. He has no rales.  Musculoskeletal: He exhibits no  edema.  Lymphadenopathy:    He has no cervical adenopathy.  Neurological: He is alert and oriented to person, place, and time.  Skin: Skin is warm and dry.  Psychiatric: He has a normal mood and affect. His behavior is normal. Thought content normal.          Assessment & Plan:  Asthma with acute exacerbation- rx with IM solumedrol to be given in office. This will be followed by PO pred taper.  Will obtain CXR to rule out PNA.  Sinusitis- rx with augmentin.  Suspect that his sputum is blood tinged due to bloody sinus drainage.  Monitor.

## 2016-09-07 ENCOUNTER — Ambulatory Visit: Payer: Self-pay | Admitting: Family

## 2016-09-23 ENCOUNTER — Telehealth: Payer: Self-pay | Admitting: Family

## 2016-09-23 NOTE — Telephone Encounter (Signed)
Received fax that clarification is needed on quantity of diclofenac gel as they would like to dispense a 90 day supply. If appropriate to dispense 90 days supply please advise what quantity should be.

## 2016-09-23 NOTE — Telephone Encounter (Signed)
720 grams would be a 3 month supply.

## 2016-09-23 NOTE — Telephone Encounter (Signed)
[facility] called with questions regarding patient's mediation quantity. Please advise   [facility] Phone: [phone]   Reference Number: [id number]

## 2016-09-26 MED ORDER — DICLOFENAC SODIUM 1 % TD GEL
2.0000 g | Freq: Four times a day (QID) | TRANSDERMAL | 0 refills | Status: DC
Start: 2016-09-26 — End: 2017-05-17

## 2016-09-26 NOTE — Telephone Encounter (Signed)
Response faxed back to [facility] at [phone].

## 2016-10-04 ENCOUNTER — Ambulatory Visit: Payer: Medicare Other | Admitting: Physical Medicine & Rehabilitation

## 2016-10-04 ENCOUNTER — Other Ambulatory Visit: Payer: Self-pay | Admitting: Family

## 2016-10-15 ENCOUNTER — Other Ambulatory Visit: Payer: Self-pay | Admitting: Family

## 2016-10-15 ENCOUNTER — Other Ambulatory Visit: Payer: Self-pay | Admitting: Physical Medicine & Rehabilitation

## 2016-10-17 ENCOUNTER — Ambulatory Visit (HOSPITAL_BASED_OUTPATIENT_CLINIC_OR_DEPARTMENT_OTHER): Payer: Medicare Other | Admitting: Physical Medicine & Rehabilitation

## 2016-10-17 ENCOUNTER — Encounter: Payer: Medicare Other | Attending: Physical Medicine & Rehabilitation

## 2016-10-17 ENCOUNTER — Encounter: Payer: Self-pay | Admitting: Physical Medicine & Rehabilitation

## 2016-10-17 VITALS — BP 103/73 | HR 99 | Resp 14

## 2016-10-17 DIAGNOSIS — K219 Gastro-esophageal reflux disease without esophagitis: Secondary | ICD-10-CM | POA: Diagnosis not present

## 2016-10-17 DIAGNOSIS — E119 Type 2 diabetes mellitus without complications: Secondary | ICD-10-CM | POA: Diagnosis not present

## 2016-10-17 DIAGNOSIS — M545 Low back pain: Secondary | ICD-10-CM | POA: Insufficient documentation

## 2016-10-17 DIAGNOSIS — I2510 Atherosclerotic heart disease of native coronary artery without angina pectoris: Secondary | ICD-10-CM | POA: Insufficient documentation

## 2016-10-17 DIAGNOSIS — Z8249 Family history of ischemic heart disease and other diseases of the circulatory system: Secondary | ICD-10-CM | POA: Insufficient documentation

## 2016-10-17 DIAGNOSIS — M797 Fibromyalgia: Secondary | ICD-10-CM | POA: Insufficient documentation

## 2016-10-17 DIAGNOSIS — Z800 Family history of malignant neoplasm of digestive organs: Secondary | ICD-10-CM | POA: Insufficient documentation

## 2016-10-17 DIAGNOSIS — E785 Hyperlipidemia, unspecified: Secondary | ICD-10-CM | POA: Diagnosis not present

## 2016-10-17 DIAGNOSIS — M47812 Spondylosis without myelopathy or radiculopathy, cervical region: Secondary | ICD-10-CM | POA: Diagnosis not present

## 2016-10-17 DIAGNOSIS — M542 Cervicalgia: Secondary | ICD-10-CM | POA: Insufficient documentation

## 2016-10-17 DIAGNOSIS — I10 Essential (primary) hypertension: Secondary | ICD-10-CM | POA: Diagnosis not present

## 2016-10-17 DIAGNOSIS — M7061 Trochanteric bursitis, right hip: Secondary | ICD-10-CM | POA: Insufficient documentation

## 2016-10-17 DIAGNOSIS — Z803 Family history of malignant neoplasm of breast: Secondary | ICD-10-CM | POA: Insufficient documentation

## 2016-10-17 MED ORDER — TRAMADOL HCL 50 MG PO TABS: 50.0000 mg | ORAL_TABLET | Freq: Four times a day (QID) | ORAL | 5 refills | Status: DC | PRN

## 2016-10-17 NOTE — Patient Instructions (Signed)

## 2016-10-17 NOTE — Progress Notes (Signed)
Right Trochanteric bursa injection With ultrasound guidance  Indication Trochanteric bursitis. Exam has tenderness over the greater trochanter of the hip. Pain has not responded to conservative care such as exercise therapy and oral medications. Pain interferes with sleep or with mobility Informed consent was obtained after describing risks and benefits of the procedure with the patient these include bleeding bruising and infection. Patient has signed written consent form. Patient placed in a lateral decubitus position with the affected hip superior. Point of maximal pain was palpated marked and prepped with Betadine and entered with a needle to bone contact. Needle slightly withdrawn then 6mg of betamethasone with 4 cc 1% lidocaine were injected. Patient tolerated procedure well. Post procedure instructions given.  

## 2016-10-18 ENCOUNTER — Telehealth: Payer: Self-pay | Admitting: *Deleted

## 2016-10-18 NOTE — Telephone Encounter (Signed)
Melissa, I just refills on pt's sertraline and saw a potential drug/drug interaction between pt's sertraline and tramadol for serotonin syndrome / seizures.  Looks like Dr Ella Bodo is prescribing tramadol.  Please advise?

## 2016-10-18 NOTE — Telephone Encounter (Signed)
Yes, but he needs to provide [place] with a UDS please.

## 2016-10-18 NOTE — Telephone Encounter (Signed)
Zolpidem RF: 07/20/16, #30 Clonazepam RF: 07/20/16, #30 UDS: 06/08/16, moderate and due 08/2016  Last OV: 08/22/16 acute visit Next OV:  None scheduled.    Pt is requesting refills through mail order. Is it ok to give 90 day supply of each and when should pt follow up in the office?

## 2016-10-19 NOTE — Telephone Encounter (Signed)
I know.  I think benefit is > risk for this pt. OK to continue.

## 2016-10-19 NOTE — Telephone Encounter (Signed)
Spoke with patient and advised him of message below he agreed that he would come in to have UDS done.  PC

## 2016-10-20 MED ORDER — CLONAZEPAM 1 MG PO TABS: 1.0000 mg | ORAL_TABLET | Freq: Every day | ORAL | 0 refills | Status: DC

## 2016-10-20 MED ORDER — ZOLPIDEM TARTRATE 10 MG PO TABS: 10.0000 mg | ORAL_TABLET | Freq: Every evening | ORAL | 0 refills | Status: DC | PRN

## 2016-10-20 NOTE — Addendum Note (Signed)
Addended by: Magdalene Molly A on: 10/20/2016 01:18 PM   Modules accepted: Orders

## 2016-10-20 NOTE — Addendum Note (Signed)
Addended by: Magdalene Molly A on: 10/20/2016 01:15 PM   Modules accepted: Orders

## 2016-10-20 NOTE — Telephone Encounter (Signed)
Patient came in today to have UDS done. Rx printed off awaiting PCP signature.  PC

## 2016-10-21 ENCOUNTER — Encounter: Payer: Self-pay | Admitting: Family

## 2016-10-21 NOTE — Telephone Encounter (Signed)
Rx faxed to [phone] at 7:50am.

## 2016-10-21 NOTE — Telephone Encounter (Signed)
Rx sent 

## 2016-10-26 ENCOUNTER — Emergency Department (HOSPITAL_COMMUNITY): Payer: Medicare Other

## 2016-10-26 ENCOUNTER — Telehealth: Payer: Self-pay | Admitting: Cardiovascular Disease

## 2016-10-26 ENCOUNTER — Emergency Department (HOSPITAL_COMMUNITY)
Admission: EM | Admit: 2016-10-26 | Discharge: 2016-10-27 | Disposition: A | Discharge status: Home / Self Care | Payer: Medicare Other | Attending: Physician Assistant | Admitting: Physician Assistant

## 2016-10-26 ENCOUNTER — Encounter (HOSPITAL_COMMUNITY): Payer: Self-pay | Admitting: Emergency Medicine

## 2016-10-26 DIAGNOSIS — E1140 Type 2 diabetes mellitus with diabetic neuropathy, unspecified: Secondary | ICD-10-CM | POA: Diagnosis not present

## 2016-10-26 DIAGNOSIS — Z7984 Long term (current) use of oral hypoglycemic drugs: Secondary | ICD-10-CM | POA: Insufficient documentation

## 2016-10-26 DIAGNOSIS — I10 Essential (primary) hypertension: Secondary | ICD-10-CM | POA: Insufficient documentation

## 2016-10-26 DIAGNOSIS — R079 Chest pain, unspecified: Secondary | ICD-10-CM

## 2016-10-26 DIAGNOSIS — Z7982 Long term (current) use of aspirin: Secondary | ICD-10-CM | POA: Diagnosis not present

## 2016-10-26 DIAGNOSIS — J45909 Unspecified asthma, uncomplicated: Secondary | ICD-10-CM | POA: Diagnosis not present

## 2016-10-26 DIAGNOSIS — Z79899 Other long term (current) drug therapy: Secondary | ICD-10-CM | POA: Diagnosis not present

## 2016-10-26 DIAGNOSIS — I2510 Atherosclerotic heart disease of native coronary artery without angina pectoris: Secondary | ICD-10-CM | POA: Diagnosis not present

## 2016-10-26 DIAGNOSIS — R0789 Other chest pain: Secondary | ICD-10-CM | POA: Insufficient documentation

## 2016-10-26 LAB — BASIC METABOLIC PANEL
Anion gap: 10 (ref 5–15)
BUN: 13 mg/dL (ref 6–20)
CO2: 24 mmol/L (ref 22–32)
CREATININE: 0.89 mg/dL (ref 0.61–1.24)
Calcium: 9.7 mg/dL (ref 8.9–10.3)
Chloride: 103 mmol/L (ref 101–111)
GFR calc Af Amer: >60 mL/min (ref >60)
GLUCOSE: 125 mg/dL — AB (ref 65–99)
POTASSIUM: 4.2 mmol/L (ref 3.5–5.1)
Sodium: 137 mmol/L (ref 135–145)

## 2016-10-26 LAB — CBC
HEMATOCRIT: 41.2 % (ref 39.0–52.0)
Hemoglobin: 13.8 g/dL (ref 13.0–17.0)
MCH: 26.4 pg (ref 26.0–34.0)
MCHC: 33.5 g/dL (ref 30.0–36.0)
MCV: 78.9 fL (ref 78.0–100.0)
Platelets: 251 10*3/uL (ref 150–400)
RBC: 5.22 MIL/uL (ref 4.22–5.81)
RDW: 16.6 % — AB (ref 11.5–15.5)
WBC: 6.8 10*3/uL (ref 4.0–10.5)

## 2016-10-26 LAB — I-STAT TROPONIN, ED: Troponin i, poc: 0 ng/mL (ref 0.00–0.08)

## 2016-10-26 MED ORDER — ISOSORBIDE MONONITRATE ER 30 MG PO TB24: 30.0000 mg | ORAL_TABLET | Freq: Every day | ORAL | 0 refills | Status: DC

## 2016-10-26 NOTE — Telephone Encounter (Signed)
Okay to increase the lisinopril to 10 mg daily. Please ask him if he is taking any NSAIDs, which can definitely raise his blood pressure. Has he had his blood pressure monitor checked? (know for sure it's accurate?). The fact that both his blood pressure and heart rate are elevated suggests that they are both driven by adrenaline (for example pain or anxiety)

## 2016-10-26 NOTE — Telephone Encounter (Signed)
Pr of Dr. Sallyanne Kuster  Spoke to patient. He voiced that his diastolic BPs have been elevated. Also concerned about his HR.  VS readings from Saturday to today: Sat BP 150/110 HR 90 Sun BP 148/120 HR 98 Mon BP 120/95 HR 95 Tue BP 115/97 HR 92 Wed 122/95 HR 112  He reports no SOB, denies lightheadedness or fatigue. Endorses chest pain x 3 days, has taken nitro as needed x2 days w total relief.  He increased his lisinopril from 5mg  to 7.5mg  daily (self dosed) 2 days ago. Reports little improvement that he can tell, but I did advise it looks like his systolic number is OK. Will check w Dr. Sallyanne Kuster about med adjustments and other recommendations given he's reporting the chest pain. Pt states he would strongly prefer to get help at home or wait a few days to come in due to financial concerns. Informed him there should be some way for [place] to work around this if he needs to be seen.  Routed for recommendations.

## 2016-10-26 NOTE — ED Triage Notes (Signed)
Pt has been having intermittent CP for the past few days. Denies any other symptoms. Pt has cardiac hx. EMS gave 324 ASA, 1nitro with relief to 8/10 from 10/10. BP 113/82, SR 95, resp 20. CBG 98

## 2016-10-26 NOTE — ED Provider Notes (Signed)
46 year old male signed out to me at shift change pending reassessment, repeat troponin and disposition. Please see previous provider's note for full H&P. At the time of evaluation patient is asymptomatic, he's had no episodes of chest pain, no shortness of breath, no concerning findings. Patient's first troponin was negative, repeat peak troponin will be drawn now patient will be reassessed with likely disposition home.  Patient still with no chest pain, repeat troponin negative. He will be discharged home with cardiology follow-up. Patient given strict return cautions, he verbalized understanding and agreement to today's plan had no further questions or concerns at the time of discharge  Vitals:   10/26/16 2230 10/26/16 2300  BP: 112/72 108/70  Pulse: 74 76  Resp: 14 12  Temp:       Okey Regal, PA-C 10/27/16 [place], MD 10/31/16 [phone]

## 2016-10-26 NOTE — Discharge Instructions (Signed)
Please read attached information. If you experience any new or worsening signs or symptoms please return to the emergency room for evaluation. Please follow-up with your primary care provider or specialist as discussed. Please use medication prescribed only as directed and discontinue taking if you have any concerning signs or symptoms.   °

## 2016-10-26 NOTE — ED Provider Notes (Signed)
[facility] DEPT Provider Note   CSN: [id number] Arrival date & time: 10/26/16  1809     History   Chief Complaint Chief Complaint  Patient presents with  . Chest Pain    HPI Cody Raymond is a 46 y.o. male.  46 year old Hispanic male with past medical history significant for CAD with a nonobstructive cath in 2011, diabetes, GERD, hyperlipidemia, hypertension that presents to the ED today with complaints of chest pain. Patient states that his chest pain is intermittent and began approximately 2 days ago. Describes the pain as sharp in nature with heavy pressure at times. It is substernal. Does not radiate. Associated with diaphoresis, shortness breath, lightheadedness. Denies any nausea or emesis. The chest pain is not associated with exertion. States the episodes last for approximately 1-2 hours. Last episode was partially 2 hours ago. Chest pain is not pleuritic in nature. Patient also states that his blood pressure has been "high and low" over the past 2 days. Patient spoke to his cardiologist today on the phone who states that it might be his blood pressure was seen that is broken. Encouraged him to go to a fire station to have his blood pressure check. Also states he needs to come to the ED to be evaluated his pain continues. Patient went to a fire station and noted to have a elevated blood pressure systolic in the [id number]. They encouraged him to be transported by EMS to the ED. They gave him 324 aspirin and 1 nitroglycerin which relieved patient's pain. On my exam patient states his pain as a 2 out of 10. Patient states proximal to 6 years ago he had a similar episode where he had a syncopal episode. He was admitted to the hospital in a heart cath was performed. There is no obstructing lesions noted. Patient did not have stents placed at that time. Does have a history of a patent arterial ductus surgery when he was a kid. Patient followed up outpatient by cardiology. The concern for  possible Brugada syndrome or cardiomyopathy. Patient had a heart echo performed in 2016 that showed an EF of 60-65%. Patient denies any fever, chills, headache, lightheadedness, dizziness, abdominal pain, nausea, emesis, urinary symptoms, change in bowel habits. Patient denies any history of DVT, lower extremity edema, calf tenderness, recent hospitalizations/surgeries, prolonged immobilizations, tobacco use.      Past Medical History:  Diagnosis Date  . Asthma   . Blood transfusion   . CAD (coronary artery disease), non obstructive on cath 2011 04/05/2012  . Coronary artery disease   . Diabetes mellitus   . Enlarged prostate   . GERD (gastroesophageal reflux disease)   . H/O syncope   . Heart disease   . History of repair of patent ductus arteriosus 07/23/2016  . Hyperlipidemia   . Hypertension   . 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 Refusal of blood transfusions as patient is Jehovah's Witness     Patient Active Problem List   Diagnosis Date Noted  . Trochanteric bursitis of right hip 10/17/2016  . History of repair of patent ductus arteriosus 07/23/2016  . Cervicalgia 07/08/2016  . Fibromyalgia 07/08/2016  . Lumbar facet joint pain 07/08/2016  . Cervical spondylosis without myelopathy 07/08/2016  . HTN (hypertension) 02/08/2016  . Preventative health care 11/11/2015  . Lumbar facet arthropathy 10/19/2015  . Pes planus of both feet 06/22/2015  . Chronic lumbar radiculopathy 06/22/2015  . Elbow pain 04/17/2015  . Right inguinal pain 01/19/2015  . History of syncope  10/20/2014  . Urinary frequency 07/08/2014  . Allergic rhinitis 07/08/2014  . LLQ abdominal pain 11/20/2013  . Lumbar radiculopathy, chronic 07/26/2013  . Achilles tendinitis of right lower extremity 03/18/2013  . Morton's neuroma of left foot 03/18/2013  . Postlaminectomy syndrome, lumbar region 03/18/2013  . Depression 12/21/2012  . Skin tag 10/27/2012  . Insomnia 07/23/2012  . CAD (coronary artery  disease), non obstructive on cath 2011 04/05/2012  . Syncope 04/04/2012  . Right bundle branch block 04/04/2012  . Neuropathy of left lower extremity 04/04/2012  . Anxiety 05/03/2011  . Chronic back pain 03/28/2011  . Palpitations 03/01/2011  . Type 2 diabetes, controlled, with peripheral neuropathy ([place]) 09/07/2010  . GERD 09/07/2010  . Coronary artery spasm ([place]) 01/31/2008  . Hypertriglyceridemia 11/16/2006  . OBESITY, NOS 11/16/2006  . ERECTILE DYSFUNCTION 11/16/2006  . Asthma with acute exacerbation 11/16/2006    Past Surgical History:  Procedure Laterality Date  . APPENDECTOMY    . BACK SURGERY    . CARDIAC CATHETERIZATION  2007   Clean Cardiac Cath ([facility]), with RCA 30% narrowing likely due to catheter induced spasm in 09/02/10.  Marland Kitchen CARDIAC CATHETERIZATION  09/02/2010   mod. nonobstructive disease in the RCA and CX, tortuous LAD  . COLONOSCOPY W/ POLYPECTOMY  03/17/11   diminutive polyp  . LOOP RECORDER EXPLANT N/A 01/14/2014   Procedure: LOOP RECORDER EXPLANT;  Surgeon: Sanda Klein, MD;  Location: [facility] CATH [facility];  Service: Cardiovascular;  Laterality: N/A;  . LOOP RECORDER IMPLANT  04/06/2012   Reveal XT [id number]  . LOOP RECORDER IMPLANT N/A 04/06/2012   Procedure: LOOP RECORDER IMPLANT;  Surgeon: Sanda Klein, MD;  Location: [facility] C[facility];  Service: Cardiovascular;  Laterality: N/A;  . NM MYOCAR PERF WALL MOTION  01/25/2008   mild anteroapical wall ischemia  . PATENT DUCTUS ARTERIOUS REPAIR     at age 59       Home Medications    Prior to Admission medications   Medication Sig Start Date End Date Taking? Authorizing Provider  albuterol (PROVENTIL) (2.5 MG/3ML) 0.083% nebulizer solution USE 1 VIAL VIA NEBULIZER  EVERY 6 HOURS AS NEEDED FOR WHEEZING OR SHORTNESS OF  BREATH 10/18/16   [name], NP  aspirin 325 MG tablet Take 325 mg by mouth daily.    Historical Provider, MD  cetirizine (ZYRTEC) 10 MG tablet Take 10 mg by mouth daily.    Historical  Provider, MD  clonazePAM (KLONOPIN) 1 MG tablet Take 1 tablet (1 mg total) by mouth daily. 10/20/16   [name], NP  diclofenac sodium (VOLTAREN) 1 % GEL Apply 2 g topically 4 (four) times daily. 09/26/16   [name], NP  ezetimibe (ZETIA) 10 MG tablet Take 1 tablet (10 mg total) by mouth daily. 06/12/16   [name], NP  fenofibrate (TRICOR) 145 MG tablet Take 1 tablet by mouth  daily 08/10/15   [name], NP  fish oil-omega-3 fatty acids 1000 MG capsule Take 2 g by mouth daily.     Historical Provider, MD  Fluticasone-Salmeterol (ADVAIR) 100-50 MCG/DOSE AEPB Inhale 1 puff into the lungs 2 (two) times daily. 11/11/15   [name], NP  gabapentin (NEURONTIN) 300 MG capsule TAKE 1 CAPSULE BY MOUTH AT  6AM, 11:30AM AND 4:30PM AND TAKE 3 CAPSULES AT 10:00PM 05/30/16   [name], NP  ketoconazole (NIZORAL) 2 % shampoo APPLY TOPICALLY DAILY 10/18/16   [name], NP  lansoprazole (PREVACID) 15 MG capsule Take 15 mg by mouth daily.  Historical Provider, MD  lisinopril (PRINIVIL,ZESTRIL) 5 MG tablet 1 tablet daily. 05/27/16   Historical Provider, MD  loperamide (IMODIUM) 2 MG capsule Take 1 capsule (2 mg total) by mouth as needed for diarrhea or loose stools. 01/20/16   Merryl Hacker, MD  LORazepam (ATIVAN) 1 MG tablet Take 1 tablet (1 mg total) by mouth once. Take 1 tablet (1 mg total) by mouth once 30 minutes-1 hour prior to cardiac MRI. 02/12/16   Dani Gobble Croitoru, MD  meloxicam (MOBIC) 7.5 MG tablet TAKE 1 TABLET BY MOUTH  DAILY 10/04/16   [name], NP  metFORMIN (GLUCOPHAGE-XR) 500 MG 24 hr tablet Take 2 tablets by mouth  daily with breakfast 05/30/16   [name], NP  montelukast (SINGULAIR) 10 MG tablet Take 1 tablet by mouth at  bedtime 05/30/16   [name], NP  Multiple Vitamin (MULTIVITAMIN WITH MINERALS) TABS tablet Take 1 tablet by mouth daily.    Historical Provider, MD  naproxen sodium (ALEVE) 220 MG tablet Take 440 mg  by mouth 2 (two) times daily. Take 440 mg in the morning and 220 mg in the evening.    Historical Provider, MD  nitroGLYCERIN (NITROSTAT) 0.4 MG SL tablet Place 1 tablet (0.4 mg total) under the tongue every 5 (five) minutes as needed for chest pain. 11/11/15   [name], NP  ondansetron (ZOFRAN ODT) 4 MG disintegrating tablet Take 1 tablet (4 mg total) by mouth every 8 (eight) hours as needed for nausea or vomiting. 01/20/16   Merryl Hacker, MD  ONETOUCH VERIO test strip CHECK BLOOD SUGAR 3 TIMES  DAILY 10/18/16   [name], NP  ramelteon (ROZEREM) 8 MG tablet Take 1 tablet (8 mg total) by mouth at bedtime. 10/15/15   [name], NP  rosuvastatin (CRESTOR) 40 MG tablet Take 1 tablet (40 mg total) by mouth daily. 06/08/16   [name], NP  sertraline (ZOLOFT) 50 MG tablet TAKE 1 TABLET BY MOUTH  DAILY 10/18/16   [name], NP  tamsulosin (FLOMAX) 0.4 MG CAPS capsule TAKE 1 CAPSULE BY MOUTH  DAILY 10/18/16   [name], NP  tiZANidine (ZANAFLEX) 2 MG tablet TAKE 1 TABLET BY MOUTH  EVERY 6 HOURS AS NEEDED FOR MUSCLE SPASM(S) 10/17/16   Charlett Blake, MD  traMADol (ULTRAM) 50 MG tablet Take 1 tablet (50 mg total) by mouth every 6 (six) hours as needed for severe pain. 10/17/16   Charlett Blake, MD  traZODone (DESYREL) 100 MG tablet Take 1 tablet by mouth at  bedtime 05/30/16   [name], NP  zolpidem (AMBIEN) 10 MG tablet Take 1 tablet (10 mg total) by mouth at bedtime as needed for sleep. 10/20/16   [name], NP    Family History Family History  Problem Relation Age of Onset  . Colon cancer Paternal Grandfather   . Prostate cancer Paternal Grandfather   . Aneurysm Father   . Heart attack Father 80  . Coronary artery disease Father   . Colon polyps Mother   . Heart disease Mother   . Coronary artery disease Mother   . Migraines Mother   . Breast cancer Maternal Grandmother   . Colon cancer Paternal Uncle     x 2  .  Stomach cancer Brother   . Liver disease      unsure who it was  . Allergies Daughter     Social History Social History  Substance Use Topics  . Smoking status: Never Smoker  . Smokeless  tobacco: Never Used  . Alcohol use No     Allergies   Benadryl [diphenhydramine hcl] and Red blood cells   Review of Systems Review of Systems  Constitutional: Negative for chills and fever.  HENT: Negative for congestion.   Eyes: Negative for visual disturbance.  Respiratory: Positive for shortness of breath. Negative for cough and wheezing.   Cardiovascular: Positive for chest pain. Negative for palpitations and leg swelling.  Gastrointestinal: Negative for abdominal pain, diarrhea, nausea and vomiting.  Genitourinary: Negative for dysuria, frequency, hematuria and urgency.  Musculoskeletal: Negative for back pain.  Skin: Negative.   Neurological: Negative for dizziness, syncope, weakness, light-headedness and headaches.     Physical Exam Updated Vital Signs BP 125/83 (BP Location: Right Arm)   Pulse 89   Temp 98.9 F (37.2 C) (Oral)   Resp 18   Ht 5\' 8"  (1.727 m)   Wt 86.6 kg   SpO2 99%   BMI 29.04 kg/m   Physical Exam  Constitutional: He is oriented to person, place, and time. He appears well-developed and well-nourished. No distress.  HENT:  Head: Normocephalic and atraumatic.  Mouth/Throat: Oropharynx is clear and moist.  Eyes: Conjunctivae and EOM are normal. Pupils are equal, round, and reactive to light. Right eye exhibits no discharge. Left eye exhibits no discharge. No scleral icterus.  Neck: Normal range of motion. Neck supple. No thyromegaly present.  Cardiovascular: Normal rate, regular rhythm, normal heart sounds and intact distal pulses.  Exam reveals no gallop and no friction rub.   No murmur heard. Pulses are 2+ bilaterally in all extremities. Carotid pulses are 2+. without any bruits noted.  Pulmonary/Chest: Effort normal and breath sounds normal. No  respiratory distress. He exhibits tenderness (over surgical scar from where loop recorder was recently removed.).  CTAB.  Abdominal: Soft. Bowel sounds are normal. He exhibits no distension. There is no tenderness. There is no rebound and no guarding.  Musculoskeletal: Normal range of motion.  Lymphadenopathy:    He has no cervical adenopathy.  Neurological: He is alert and oriented to person, place, and time.  Skin: Skin is warm and dry. Capillary refill takes less than 2 seconds.  Nursing note and vitals reviewed.    ED Treatments / Results  Labs (all labs ordered are listed, but only abnormal results are displayed) Labs Reviewed  BASIC METABOLIC PANEL - Abnormal; Notable for the following:       Result Value   Glucose, Bld 125 (*)    All other components within normal limits  CBC - Abnormal; Notable for the following:    RDW 16.6 (*)    All other components within normal limits  TROPONIN I  I-STAT TROPOININ, ED    EKG  EKG Interpretation  Date/Time:  Wednesday October 26 2016 18:27:23 EST Ventricular Rate:  88 PR Interval:    QRS Duration: 96 QT Interval:  344 QTC Calculation: 417 R Axis:   101 Text Interpretation:  Sinus rhythm Consider right ventricular hypertrophy ST elevation, consider anterior injury No significant change since last tracing Confirmed by Gerald Leitz ([id number]) on 10/26/2016 6:33:38 PM       Radiology No results found.  Procedures Procedures (including critical care time)  Medications Ordered in ED Medications - No data to display   Initial Impression / Assessment and Plan / ED Course  I have reviewed the triage vital signs and the nursing notes.  Pertinent labs & imaging results that were available during my care of the patient were reviewed  by me and considered in my medical decision making (see chart for details).     Patient is to be discharged with recommendation to follow up with cardiologist in regards to today's hospital  visit. Chest pain is not likely of cardiac or pulmonary etiology d/t presentation, perc negative, VSS, no tracheal deviation, no JVD or new murmur, RRR, breath sounds equal bilaterally, EKG without acute abnormalities, negative  troponin, and negative CXR.  Spoke with Dr. Domenic Polite with heart care who states recommends adding on imdur 30 QD and following up in the office this week is second troponin is negative and pt continues to be cp free. Pt has been cp free in the ED. Awaiting second troponin at 10PM. Sign out give to PA-C [place]. If second troponin is negative and patient is cp free likely discharge home. Dicussed pt with Dr. Thomasene Lot who is agreeable to the above plan.   Final Clinical Impressions(s) / ED Diagnoses   Final diagnoses:  Chest pain, unspecified type    New Prescriptions Discharge Medication List as of 10/26/2016 11:30 PM    START taking these medications   Details  isosorbide mononitrate (IMDUR) 30 MG 24 hr tablet Take 1 tablet (30 mg total) by mouth daily., Starting Wed 10/26/2016, Print         Doristine Devoid, PA-C 10/29/16 [place], MD 10/31/16 1246

## 2016-10-26 NOTE — Telephone Encounter (Signed)
Spoke to patient and relayed recommendations. Informed him he's welcome to come in and have BP cuff calibrated/checked in office, he voiced understanding, states he may do this - will call again to schedule if interested. We also discussed his hr findings and concern that this seems to have gone up. He voices acknowledgment that perhaps it could be related to his concern over the BP (anxiety). He denies current NSAID use. Advised him to watch these readings for a few more days. Advised to check BP reading with other device if able, see if there is a notable difference in the diastolic (and systolic) numbers. If he continues to get elevated HR readings or develops new symptoms he knows to call.

## 2016-10-26 NOTE — Telephone Encounter (Signed)
Please call,blood pressure have been acting up. The bottom number have been high and the top number have been low.His pulse rate was 112 a  Few minutes ago, the last few days it have been high.

## 2016-10-27 ENCOUNTER — Telehealth: Payer: Self-pay | Admitting: Family

## 2016-10-27 LAB — TROPONIN I: Troponin I: <0.03 ng/mL (ref <0.03)

## 2016-10-27 NOTE — Telephone Encounter (Signed)
Ended up going to ED last night [facility]

## 2016-10-27 NOTE — Telephone Encounter (Signed)
Pharmacy called requesting a refill of patient's clonazePAM (KLONOPIN) 1 MG tablet  Please advise  Pharmacy: [facility]   Pharmacy phone: [phone]  Reference #: [id number]

## 2016-10-27 NOTE — ED Notes (Signed)
Called lab to inquire about Troponin, they stated pt Troponin on machine @ moment.

## 2016-10-28 NOTE — Telephone Encounter (Signed)
Spoke with Riya at [facility] and informed her that both Rxs were signed and refaxed the same day as original Rx. She states they never received the 2nd fax. Gave her verbal auth to fill clonazepam.

## 2016-11-04 ENCOUNTER — Ambulatory Visit (INDEPENDENT_AMBULATORY_CARE_PROVIDER_SITE_OTHER): Payer: Medicare Other | Admitting: Cardiovascular Disease

## 2016-11-04 ENCOUNTER — Encounter: Payer: Self-pay | Admitting: Cardiovascular Disease

## 2016-11-04 VITALS — BP 105/72 | HR 81 | Ht 68.0 in | Wt 191.0 lb

## 2016-11-04 DIAGNOSIS — Z8249 Family history of ischemic heart disease and other diseases of the circulatory system: Secondary | ICD-10-CM | POA: Diagnosis not present

## 2016-11-04 DIAGNOSIS — I10 Essential (primary) hypertension: Secondary | ICD-10-CM | POA: Diagnosis not present

## 2016-11-04 DIAGNOSIS — Z87898 Personal history of other specified conditions: Secondary | ICD-10-CM

## 2016-11-04 DIAGNOSIS — Z9889 Other specified postprocedural states: Secondary | ICD-10-CM

## 2016-11-04 DIAGNOSIS — I2510 Atherosclerotic heart disease of native coronary artery without angina pectoris: Secondary | ICD-10-CM

## 2016-11-04 DIAGNOSIS — R072 Precordial pain: Secondary | ICD-10-CM

## 2016-11-04 DIAGNOSIS — Z8774 Personal history of (corrected) congenital malformations of heart and circulatory system: Secondary | ICD-10-CM

## 2016-11-04 MED ORDER — METOPROLOL TARTRATE 50 MG PO TABS: ORAL_TABLET | ORAL | 0 refills | Status: DC

## 2016-11-04 NOTE — Progress Notes (Signed)
Patient ID: Cody Raymond, male   DOB: 01-15-1971, 47 y.o.   MRN: [id number]    Cardiology Office Note    Date:  11/04/2016   ID:  Cody Raymond, DOB 02/26/1971, MRN [id number]  PCP:  Nance Pear., NP  Cardiologist: Virl Axe, M.D. (EP);  Sanda Klein, MD   Chief Complaint  Patient presents with  . Hospitalization Follow-up    ER Follow Up  . Shortness of Breath    occasionally.  . Dizziness    frequently; due to medications    History of Present Illness:  Cody Raymond is a 46 y.o. male with a history of unexplained syncope, family history of hypertrophic cardiomyopathy, personal history of repaired patent ductus arteriosus, minor coronary atherosclerosis by previous cardiac catheterization, mild hypertension and hyperlipidemia and type 2 diabetes mellitus.  He was seen again in the emergency room Oct 27, 2016 with chest pain. He had sudden onset of chest heaviness at rest, felt that he couldn't breathe and he might pass out. He went to the neighborhood fire station and was told that his blood pressure was 170/115. His emergency room workup was unrevealing with normal chest x-ray, troponin x 3 and unchanged ECG. Isosorbide was added for concern of possible coronary vasospasm. He has a tolerable headache. He has not had chest discomfort since  He has not had syncope or near syncope. He has lost substantial weight. He is no longer obese, now has a BMI that is just under 30. His A1c is now excellent at 5.2%. His recent event monitor did not show any serious arrhythmia.  His EKG remains unusual. Shows normal sinus rhythm with rSR' wave in V1 that could represent right ventricular hypertrophy and a nonspecific less than 1 mm ST segment elevation in leads V2-V3. Looking back over older ECGs this pattern has previously been intermittently present..  He had patent ductus arteriosus repair at age [age]. He had a cardiac catheterization 2011 that showed minor coronary  atherosclerosis and raised some suspicion for coronary artery spasm. He had a syncopal event in 2013, but then had an implantable loop recorder that never showed any arrhythmia (removed after EOS). Syncope has not recurred. His mother has hypertrophic cardiomyopathy and has undergone a septal myectomy surgery in [place]. 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    Past Medical History:  Diagnosis Date  . Asthma   . Blood transfusion   . CAD (coronary artery disease), non obstructive on cath 2011 04/05/2012  . Coronary artery disease   . Diabetes mellitus   . Enlarged prostate   . GERD (gastroesophageal reflux disease)   . H/O syncope   . Heart disease   . History of repair of patent ductus arteriosus 07/23/2016  . Hyperlipidemia   . Hypertension   . 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 Refusal of blood transfusions as patient is Jehovah's Witness     Past Surgical History:  Procedure Laterality Date  . APPENDECTOMY    . BACK SURGERY    . CARDIAC CATHETERIZATION  2007   Clean Cardiac Cath ([facility]), with RCA 30% narrowing likely due to catheter induced spasm in 09/02/10.  Marland Kitchen CARDIAC CATHETERIZATION  09/02/2010   mod. nonobstructive disease in the RCA and CX, tortuous LAD  . COLONOSCOPY W/ POLYPECTOMY  03/17/11   diminutive polyp  . LOOP RECORDER EXPLANT N/A 01/14/2014   Procedure: LOOP RECORDER EXPLANT;  Surgeon: Sanda Klein, MD;  Location: [facility];  Service: Cardiovascular;  Laterality: N/A;  . LOOP RECORDER IMPLANT  04/06/2012   Reveal XT [id number]  . LOOP RECORDER IMPLANT N/A 04/06/2012   Procedure: LOOP RECORDER IMPLANT;  Surgeon: Sanda Klein, MD;  Location: [facility];  Service: Cardiovascular;  Laterality: N/A;  . NM MYOCAR PERF WALL MOTION  01/25/2008   mild anteroapical wall ischemia  . PATENT DUCTUS ARTERIOUS REPAIR     at age 89    Current Medications: Outpatient Medications Prior to Visit  Medication Sig Dispense Refill    . albuterol (PROVENTIL) (2.5 MG/3ML) 0.083% nebulizer solution USE 1 VIAL VIA NEBULIZER  EVERY 6 HOURS AS NEEDED FOR WHEEZING OR SHORTNESS OF  BREATH 75 mL 1  . aspirin 325 MG tablet Take 325 mg by mouth daily.    . cetirizine (ZYRTEC) 10 MG tablet Take 10 mg by mouth daily.    . clonazePAM (KLONOPIN) 1 MG tablet Take 1 tablet (1 mg total) by mouth daily. 90 tablet 0  . diclofenac sodium (VOLTAREN) 1 % GEL Apply 2 g topically 4 (four) times daily. 720 g 0  . ezetimibe (ZETIA) 10 MG tablet Take 1 tablet (10 mg total) by mouth daily. 30 tablet 5  . fenofibrate (TRICOR) 145 MG tablet Take 1 tablet by mouth  daily 90 tablet 1  . fish oil-omega-3 fatty acids 1000 MG capsule Take 2 g by mouth daily.     . Fluticasone-Salmeterol (ADVAIR) 100-50 MCG/DOSE AEPB Inhale 1 puff into the lungs 2 (two) times daily. 1 each 3  . gabapentin (NEURONTIN) 300 MG capsule TAKE 1 CAPSULE BY MOUTH AT  6AM, 11:30AM AND 4:30PM AND TAKE 3 CAPSULES AT 10:00PM 540 capsule 1  . isosorbide mononitrate (IMDUR) 30 MG 24 hr tablet Take 1 tablet (30 mg total) by mouth daily. 30 tablet 0  . ketoconazole (NIZORAL) 2 % shampoo APPLY TOPICALLY DAILY 360 mL 1  . lansoprazole (PREVACID) 15 MG capsule Take 15 mg by mouth daily.    Marland Kitchen lisinopril (PRINIVIL,ZESTRIL) 5 MG tablet Take 7.5 mg by mouth daily.     Marland Kitchen loperamide (IMODIUM) 2 MG capsule Take 1 capsule (2 mg total) by mouth as needed for diarrhea or loose stools. 12 capsule 0  . LORazepam (ATIVAN) 1 MG tablet Take 1 tablet (1 mg total) by mouth once. Take 1 tablet (1 mg total) by mouth once 30 minutes-1 hour prior to cardiac MRI. 1 tablet 0  . meloxicam (MOBIC) 7.5 MG tablet TAKE 1 TABLET BY MOUTH  DAILY 42 tablet 0  . metFORMIN (GLUCOPHAGE-XR) 500 MG 24 hr tablet Take 2 tablets by mouth  daily with breakfast 180 tablet 1  . montelukast (SINGULAIR) 10 MG tablet Take 1 tablet by mouth at  bedtime 90 tablet 1  . Multiple Vitamin (MULTIVITAMIN WITH MINERALS) TABS tablet Take 1 tablet by  mouth daily.    . naproxen sodium (ALEVE) 220 MG tablet Take 440 mg by mouth 2 (two) times daily. Take 440 mg in the morning and 220 mg in the evening.    . nitroGLYCERIN (NITROSTAT) 0.4 MG SL tablet Place 1 tablet (0.4 mg total) under the tongue every 5 (five) minutes as needed for chest pain. 30 tablet 1  . ondansetron (ZOFRAN ODT) 4 MG disintegrating tablet Take 1 tablet (4 mg total) by mouth every 8 (eight) hours as needed for nausea or vomiting. 20 tablet 0  . ONETOUCH VERIO test strip CHECK BLOOD SUGAR 3 TIMES  DAILY 300 each 1  . ramelteon (ROZEREM) 8  MG tablet Take 1 tablet (8 mg total) by mouth at bedtime. 30 tablet 5  . rosuvastatin (CRESTOR) 40 MG tablet Take 1 tablet (40 mg total) by mouth daily. 30 tablet 5  . sertraline (ZOLOFT) 50 MG tablet TAKE 1 TABLET BY MOUTH  DAILY 90 tablet 1  . tamsulosin (FLOMAX) 0.4 MG CAPS capsule TAKE 1 CAPSULE BY MOUTH  DAILY 90 capsule 1  . tiZANidine (ZANAFLEX) 2 MG tablet TAKE 1 TABLET BY MOUTH  EVERY 6 HOURS AS NEEDED FOR MUSCLE SPASM(S) 270 tablet 0  . traMADol (ULTRAM) 50 MG tablet Take 1 tablet (50 mg total) by mouth every 6 (six) hours as needed for severe pain. 120 tablet 5  . traZODone (DESYREL) 100 MG tablet Take 1 tablet by mouth at  bedtime 90 tablet 1  . zolpidem (AMBIEN) 10 MG tablet Take 1 tablet (10 mg total) by mouth at bedtime as needed for sleep. 90 tablet 0   No facility-administered medications prior to visit.      Allergies:   Benadryl [diphenhydramine hcl] and Red blood cells   Social History   Social History  . Marital status: Married    Spouse name: N/A  . Number of children: N/A  . Years of education: N/A   Social History Main Topics  . Smoking status: Never Smoker  . Smokeless tobacco: Never Used  . Alcohol use No  . Drug use: No  . Sexual activity: Not Asked   Other Topics Concern  . None   Social History Narrative   Holter monitor 08/2010: PVCs and sinus tachy.   Sleep Study (02/2008): mild sleep apnea, no  indication for CPAP.     Family History:  The patient's family history includes Allergies in his daughter; Aneurysm in his father; Breast cancer in his maternal grandmother; Colon cancer in his paternal grandfather and paternal uncle; Colon polyps in his mother; Coronary artery disease in his father and mother; Heart attack (age of onset: 83) in his father; Heart disease in his mother; Migraines in his mother; Prostate cancer in his paternal grandfather; Stomach cancer in his brother.   ROS:   Please see the history of present illness.    ROS All other systems reviewed and are negative.   PHYSICAL EXAM:   VS:  BP 105/72   Pulse 81   Ht 5\' 8"  (1.727 m)   Wt 86.6 kg (191 lb)   BMI 29.04 kg/m    GEN: Well nourished, well developed, in no acute distress  HEENT: normal  Neck: no JVD, carotid bruits, or masses Cardiac: RRR; no murmurs, rubs, or gallops,no edema , large left lateral thoracotomy scar with some keloid formation Respiratory:  clear to auscultation bilaterally, normal work of breathing GI: soft, nontender, nondistended, + BS MS: no deformity or atrophy  Skin: warm and dry, no rash Neuro:  Alert and Oriented x 3, Strength and sensation are intact Psych: euthymic mood, full affect  Wt Readings from Last 3 Encounters:  11/04/16 86.6 kg (191 lb)  10/26/16 86.6 kg (191 lb)  08/22/16 87.7 kg (193 lb 6.4 oz)      Studies/Labs Reviewed:   EKG:  EKG is not ordered today.  The ekg ordered 10/27/2016 demonstrates Normal sinus rhythm, rSR'pattern in lead V61mild ST elevation V2-V3, QTC normal Recent Labs: 01/08/2016: TSH 0.95 01/20/2016: ALT 53 10/26/2016: BUN 13; Creatinine, Ser 0.89; Hemoglobin 13.8; Platelets 251; Potassium 4.2; Sodium 137   Lipid Panel    Component Value Date/Time   CHOL  186 06/08/2016 0923   TRIG 211.0 (H) 06/08/2016 0923   HDL 36.40 (L) 06/08/2016 0923   CHOLHDL 5 06/08/2016 0923   VLDL 42.2 (H) 06/08/2016 0923   LDLCALC 122 (H) 04/17/2015 0940   LDLDIRECT  102.0 06/08/2016 0923    ASSESSMENT:     1. Coronary artery disease involving native coronary artery of native heart without angina pectoris   2. Essential hypertension   3. History of syncope   4. Family history of hypertrophic cardiomyopathy   5. S/P repair of PDA (patent ductus arteriosus)   6. Precordial chest pain       PLAN:  In order of problems listed above:  1. CAD/coronary spasm: His current chest pain symptoms do not sound typical for either fixed coronary artery disease or coronary vasospasm. The focus is on treatment of coronary risk factors which for the most part seems to be well achieved. He is worried about progression of coronary disease. We'll try to see if he can afford a coronary CT angiogram. Do not take ACE inhibitor, NSAID or metformin on the day of CT 2. HTN: His blood pressure is excellent today. Cautioned him to avoid taking NSAIDs and to avoid sodium rich foods. In the past we have had to cut back on his dose of lisinopril to avoid symptomatic hypotension. If she loses more weight this may be necessary. 3. Syncope: He had one episode of syncope years ago that remains unexplained, even with an implantable loop recorder 4. Family history of hypertrophic cardiomyopathy:  ECG raises some concern for hypertrophic or dermopathy or Brugada syndrome. No clear abnormality is not evident on echo 5. Hx PDA repair: No evidence of sequelae from this.   Medication Adjustments/Labs and Tests Ordered: Current medicines are reviewed at length with the patient today.  Concerns regarding medicines are outlined above.  Medication changes, Labs and Tests ordered today are listed in the Patient Instructions below. Patient Instructions  Dr Sallyanne Kuster recommends that you have a CT Angiogram of your heart. This will be performed at our [facility] location - [address], [address]. >>You will need to have blood work done prior to the test. Paperwork to take with you to the lab has  been provided. >>TAKE Metoprolol 50 mg the morning of the test. A one-time prescription has been sent to your pharmacy on file. >>DO NOT TAKE Metformin, Lisinopril, or any NSAIDS on the day of the test.  Dr Sallyanne Kuster recommends that you schedule a follow-up appointment after your test.  Cardiac CT Angiogram A cardiac CT angiogram is a test to help your health care provider find out why you are having chest pains or other symptoms of heart disease. The test uses an advanced type of X-ray machine that scans your heart and the area around the heart and creates multiple pictures of it. Other names for the test are coronary CT angiography, coronary artery scanning, and CTA.  The test is painless and fairly quick. It is noninvasive. That means it does not involve any type of surgery or cuts (incisions). Instead, a fluid called contrast dye is injected into an IV tube in your arm. The contrast dye acts as a highlighter as it flows through the veins. With the CT scan, it lets your health care provider see:   If the coronary arteries in your heart are more narrow than they should be, or if they are blocked.  If there is fluid around the heart.  If the muscles and tissues  of the heart look weak or show signs of disease.  If the lungs contain any blood clots. LET [facility] CARE PROVIDER KNOW ABOUT:  Any allergies you have.   All medicines you are taking, including vitamins, herbs, eye drops, creams, and over-the-counter medicines.  Previous problems you or members of your family have had with the use of anesthetics.  Any blood disorders you have.  Previous surgeries you have had.  Medical conditions you have. RISKS AND COMPLICATIONS Generally, this is a safe procedure. However, as with any procedure, problems can occur. Possible problems include:   Allergic reaction to the contrast dye. This can range from mild to severe and may include:   Itching at the IV tube insertion site.    Redness at the IV tube insertion site.   Hives.   Nausea.   Difficulty breathing.   Kidney failure.   Problems from radiation exposure. This test involves the use of radiation. Radiation exposure can be dangerous to a pregnant patient and fetus. If you are pregnant, shields are used to protect your belly and pelvic area. More details are available from your health care provider. BEFORE THE PROCEDURE  The day before the test:    Stop drinking caffeinated beverages. These include energy drinks, tea, soda, coffee, and hot chocolate.  Stop taking medicines to treat erectile dysfunction. They can interfere with medicines you may be given during the procedure. Check with your health care provider if you should stop taking any other medicines. On the day of the test:   About 4 hours before the test, stop eating and drinking anything but water as advised by your health care provider.  Avoid wearing jewelry. You will have to undress from the waist up and wear a hospital gown. PROCEDURE  The hair on your chest may need to be shaved. This is done because small sticky patches called electrodes are put on your chest. These transmit information that helps monitor your heart during the test.  You might be given heart medicine during the test. This is done to control your heart rate during the test so a good image is obtained.  An IV tube will be inserted in your arm.  You will be asked to lie on a table with your arms above your head.  The contrast dye will be injected into the IV tube. You might feel warm or you may get a metallic taste in your mouth.  The table you are lying on will move into a large machine that will do the scanning.  You will be able to see, hear, and talk to the person running the machine while you are in it. Follow that person's directions. You may be asked to hold your breath for 2-3 seconds as pictures are taken.  The CT machine will move around you to  take pictures. Do not move while it is scanning. This helps to get a good image of your heart.  When the best possible pictures have been taken, the machine will be turned off. The table will move out of the machine. The IV tube will then be removed. AFTER THE PROCEDURE  You will be allowed to get dressed and return to your normal activities.  Results will be interpreted by the health care provider and the results will be discussed with you. This information is not intended to replace advice given to you by your health care provider. Make sure you discuss any questions you have with your health care provider. Document  Released: 08/18/2008 Document Revised: 09/26/2014 Document Reviewed: 05/22/2013 Elsevier Interactive Patient Education  2017 [place], Sanda Klein, MD  11/04/2016 1:04 PM    [facility] [place], [place], [place]  [address] Phone: [phone]; Fax: [phone]

## 2016-11-04 NOTE — Patient Instructions (Addendum)
Dr Sallyanne Kuster recommends that you have a CT Angiogram of your heart. This will be performed at our [facility] location - [address]. >>You will need to have blood work done prior to the test. Paperwork to take with you to the lab has been provided. >>TAKE Metoprolol 50 mg the morning of the test. A one-time prescription has been sent to your pharmacy on file. >>DO NOT TAKE Metformin, Lisinopril, or any NSAIDS on the day of the test.  Dr Sallyanne Kuster recommends that you schedule a follow-up appointment after your test.  Cardiac CT Angiogram A cardiac CT angiogram is a test to help your health care provider find out why you are having chest pains or other symptoms of heart disease. The test uses an advanced type of X-ray machine that scans your heart and the area around the heart and creates multiple pictures of it. Other names for the test are coronary CT angiography, coronary artery scanning, and CTA.  The test is painless and fairly quick. It is noninvasive. That means it does not involve any type of surgery or cuts (incisions). Instead, a fluid called contrast dye is injected into an IV tube in your arm. The contrast dye acts as a highlighter as it flows through the veins. With the CT scan, it lets your health care provider see:   If the coronary arteries in your heart are more narrow than they should be, or if they are blocked.  If there is fluid around the heart.  If the muscles and tissues of the heart look weak or show signs of disease.  If the lungs contain any blood clots. LET [facility] CARE PROVIDER KNOW ABOUT:  Any allergies you have.   All medicines you are taking, including vitamins, herbs, eye drops, creams, and over-the-counter medicines.  Previous problems you or members of your family have had with the use of anesthetics.  Any blood disorders you have.  Previous surgeries you have had.  Medical conditions you have. RISKS AND COMPLICATIONS Generally, this  is a safe procedure. However, as with any procedure, problems can occur. Possible problems include:   Allergic reaction to the contrast dye. This can range from mild to severe and may include:   Itching at the IV tube insertion site.   Redness at the IV tube insertion site.   Hives.   Nausea.   Difficulty breathing.   Kidney failure.   Problems from radiation exposure. This test involves the use of radiation. Radiation exposure can be dangerous to a pregnant patient and fetus. If you are pregnant, shields are used to protect your belly and pelvic area. More details are available from your health care provider. BEFORE THE PROCEDURE  The day before the test:    Stop drinking caffeinated beverages. These include energy drinks, tea, soda, coffee, and hot chocolate.  Stop taking medicines to treat erectile dysfunction. They can interfere with medicines you may be given during the procedure. Check with your health care provider if you should stop taking any other medicines. On the day of the test:   About 4 hours before the test, stop eating and drinking anything but water as advised by your health care provider.  Avoid wearing jewelry. You will have to undress from the waist up and wear a hospital gown. PROCEDURE  The hair on your chest may need to be shaved. This is done because small sticky patches called electrodes are put on your chest. These transmit information that helps  monitor your heart during the test.  You might be given heart medicine during the test. This is done to control your heart rate during the test so a good image is obtained.  An IV tube will be inserted in your arm.  You will be asked to lie on a table with your arms above your head.  The contrast dye will be injected into the IV tube. You might feel warm or you may get a metallic taste in your mouth.  The table you are lying on will move into a large machine that will do the scanning.  You will be  able to see, hear, and talk to the person running the machine while you are in it. Follow that person's directions. You may be asked to hold your breath for 2-3 seconds as pictures are taken.  The CT machine will move around you to take pictures. Do not move while it is scanning. This helps to get a good image of your heart.  When the best possible pictures have been taken, the machine will be turned off. The table will move out of the machine. The IV tube will then be removed. AFTER THE PROCEDURE  You will be allowed to get dressed and return to your normal activities.  Results will be interpreted by the health care provider and the results will be discussed with you. This information is not intended to replace advice given to you by your health care provider. Make sure you discuss any questions you have with your health care provider. Document Released: 08/18/2008 Document Revised: 09/26/2014 Document Reviewed: 05/22/2013 Elsevier Interactive Patient Education  2017 [employer].

## 2016-11-05 LAB — BASIC METABOLIC PANEL
BUN: 13 mg/dL (ref 7–25)
CO2: 26 mmol/L (ref 20–31)
Calcium: 9.4 mg/dL (ref 8.6–10.3)
Chloride: 103 mmol/L (ref 98–110)
Creat: 1.06 mg/dL (ref 0.60–1.35)
GLUCOSE: 100 mg/dL — AB (ref 65–99)
POTASSIUM: 4.7 mmol/L (ref 3.5–5.3)
SODIUM: 138 mmol/L (ref 135–146)

## 2016-11-09 ENCOUNTER — Emergency Department (HOSPITAL_COMMUNITY)
Admission: EM | Admit: 2016-11-09 | Discharge: 2016-11-09 | Disposition: A | Discharge status: Home / Self Care | Payer: Medicare Other | Attending: Emergency Medicine | Admitting: Emergency Medicine

## 2016-11-09 ENCOUNTER — Emergency Department (HOSPITAL_COMMUNITY): Payer: Medicare Other

## 2016-11-09 ENCOUNTER — Encounter (HOSPITAL_COMMUNITY): Payer: Self-pay | Admitting: *Deleted

## 2016-11-09 DIAGNOSIS — E1142 Type 2 diabetes mellitus with diabetic polyneuropathy: Secondary | ICD-10-CM | POA: Insufficient documentation

## 2016-11-09 DIAGNOSIS — J45909 Unspecified asthma, uncomplicated: Secondary | ICD-10-CM | POA: Diagnosis not present

## 2016-11-09 DIAGNOSIS — Z7984 Long term (current) use of oral hypoglycemic drugs: Secondary | ICD-10-CM | POA: Insufficient documentation

## 2016-11-09 DIAGNOSIS — I10 Essential (primary) hypertension: Secondary | ICD-10-CM | POA: Insufficient documentation

## 2016-11-09 DIAGNOSIS — I2510 Atherosclerotic heart disease of native coronary artery without angina pectoris: Secondary | ICD-10-CM | POA: Insufficient documentation

## 2016-11-09 DIAGNOSIS — R079 Chest pain, unspecified: Secondary | ICD-10-CM

## 2016-11-09 DIAGNOSIS — R0789 Other chest pain: Secondary | ICD-10-CM | POA: Diagnosis present

## 2016-11-09 DIAGNOSIS — Z79899 Other long term (current) drug therapy: Secondary | ICD-10-CM | POA: Insufficient documentation

## 2016-11-09 DIAGNOSIS — Z7982 Long term (current) use of aspirin: Secondary | ICD-10-CM | POA: Diagnosis not present

## 2016-11-09 LAB — CBC WITH DIFFERENTIAL/PLATELET
BASOS PCT: 0 %
Basophils Absolute: 0 10*3/uL (ref 0.0–0.1)
Eosinophils Absolute: 0.1 10*3/uL (ref 0.0–0.7)
Eosinophils Relative: 1 %
HEMATOCRIT: 42.2 % (ref 39.0–52.0)
Hemoglobin: 14.2 g/dL (ref 13.0–17.0)
LYMPHS ABS: 1.4 10*3/uL (ref 0.7–4.0)
Lymphocytes Relative: 25 %
MCH: 26.4 pg (ref 26.0–34.0)
MCHC: 33.6 g/dL (ref 30.0–36.0)
MCV: 78.6 fL (ref 78.0–100.0)
MONO ABS: 0.4 10*3/uL (ref 0.1–1.0)
MONOS PCT: 7 %
NEUTROS ABS: 3.7 10*3/uL (ref 1.7–7.7)
Neutrophils Relative %: 67 %
Platelets: 259 10*3/uL (ref 150–400)
RBC: 5.37 MIL/uL (ref 4.22–5.81)
RDW: 16.2 % — AB (ref 11.5–15.5)
WBC: 5.6 10*3/uL (ref 4.0–10.5)

## 2016-11-09 LAB — BASIC METABOLIC PANEL
ANION GAP: 9 (ref 5–15)
BUN: 11 mg/dL (ref 6–20)
CALCIUM: 9.5 mg/dL (ref 8.9–10.3)
CO2: 26 mmol/L (ref 22–32)
Chloride: 104 mmol/L (ref 101–111)
Creatinine, Ser: 0.97 mg/dL (ref 0.61–1.24)
GLUCOSE: 90 mg/dL (ref 65–99)
POTASSIUM: 3.9 mmol/L (ref 3.5–5.1)
SODIUM: 139 mmol/L (ref 135–145)

## 2016-11-09 LAB — I-STAT TROPONIN, ED
TROPONIN I, POC: 0 ng/mL (ref 0.00–0.08)
TROPONIN I, POC: 0 ng/mL (ref 0.00–0.08)

## 2016-11-09 MED ORDER — IOPAMIDOL (ISOVUE-370) INJECTION 76%
INTRAVENOUS | Status: AC
  Administered 2016-11-09: 100 mL
  Filled 2016-11-09: qty 100

## 2016-11-09 NOTE — ED Triage Notes (Signed)
Pt started having chest pain over last 3 weeks and has been increasing.  Pt has seen Dr. Loletha Grayer (cardiologist).  He is to have CT done on heart on Friday.  Today woke up at 0300 with pain to chest and radiates to right side with some neck and shoulder pain. Pt took two nitro and last one at 0945 and took his lisinopril as well.  SBP 90 per ems.

## 2016-11-09 NOTE — ED Notes (Signed)
Patient transported to CT 

## 2016-11-09 NOTE — ED Provider Notes (Signed)
[facility] DEPT Provider Note   CSN: [id number] Arrival date & time: 11/09/16  1030     History   Chief Complaint Chief Complaint  Patient presents with  . Chest Pain  . Shortness of Breath  . Near Syncope    HPI Cody Raymond is a 46 y.o. male.  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 Patient reports having intermittent chest pain for the past 3 weeks but notes this morning he woke up at 3 AM due to having light heavy chest discomfort over his midsternal chest wall. Patient states the chest pain has remained constant since waking up this morning but notes it has gradually worsened. Patient now reports having sharp heavy pressure across his mid sternal and left side of his chest which radiates towards the left side of his neck. Endorses associated numbness to his left arm and lightheadedness. Patient reports taking 2 aspirin this morning and 2 nitroglycerin without relief results him calling EMS. Patient reports this episode of chest pain does not feel similar to when he was last evaluated in the ED on 10/29/16 for chest pain but notes it feels similar to when he had a syncopal episode 3 years ago. Patient notes he recently saw his cardiologist on 11/04/16 for ED follow-up evaluation and reports he has a CT Angio chest scheduled for 11/11/16. Denies fever, chills, headache, visual changes, cough, shortness of breath, wheezing, palpitations, abdominal pain, nausea, vomiting, diaphoresis, leg swelling, syncope. Patient reports taking his home medications this morning. Endorses positive family history of mother having hypertrophic cardiomyopathy. Patient denies smoking.  PCP- Dr. Inda Castle Cardiologist- Dr. Juliette Alcide  Past Medical History:  Diagnosis Date  . Asthma   .  Blood transfusion   . CAD (coronary artery disease), non obstructive on cath 2011 04/05/2012  . Coronary artery disease   . Diabetes mellitus   . Enlarged prostate   . GERD (gastroesophageal reflux disease)   . H/O syncope   . Heart disease   . History of repair of patent ductus arteriosus 07/23/2016  . Hyperlipidemia   . Hypertension   . 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 Refusal of blood transfusions as patient is Jehovah's Witness     Patient Active Problem List   Diagnosis Date Noted  . Trochanteric bursitis of right hip 10/17/2016  . History of repair of patent ductus arteriosus 07/23/2016  . Cervicalgia 07/08/2016  . Fibromyalgia 07/08/2016  . Lumbar facet joint pain 07/08/2016  . Cervical spondylosis without myelopathy 07/08/2016  . HTN (hypertension) 02/08/2016  . Preventative health care 11/11/2015  . Lumbar facet arthropathy 10/19/2015  . Pes planus of both feet 06/22/2015  . Chronic lumbar radiculopathy 06/22/2015  . Elbow pain 04/17/2015  . Right inguinal pain 01/19/2015  . History of syncope 10/20/2014  . Urinary frequency 07/08/2014  . Allergic rhinitis 07/08/2014  . LLQ abdominal pain 11/20/2013  . Lumbar radiculopathy, chronic 07/26/2013  . Achilles tendinitis of right lower extremity 03/18/2013  . Morton's neuroma of left foot 03/18/2013  . Postlaminectomy syndrome, lumbar region 03/18/2013  . Depression 12/21/2012  . Skin tag 10/27/2012  . Insomnia 07/23/2012  . CAD (coronary artery disease), non obstructive on cath 2011 04/05/2012  . Syncope 04/04/2012  . Right bundle branch block 04/04/2012  . Neuropathy of left lower extremity 04/04/2012  .  Anxiety 05/03/2011  . Chronic back pain 03/28/2011  . Palpitations 03/01/2011  . Type 2 diabetes, controlled, with peripheral neuropathy ([place]) 09/07/2010  . GERD 09/07/2010  . Coronary artery spasm ([place]) 01/31/2008  . Hypertriglyceridemia 11/16/2006  . OBESITY, NOS 11/16/2006  . ERECTILE DYSFUNCTION  11/16/2006  . Asthma with acute exacerbation 11/16/2006    Past Surgical History:  Procedure Laterality Date  . APPENDECTOMY    . BACK SURGERY    . CARDIAC CATHETERIZATION  2007   Clean Cardiac Cath ([place]), with RCA 30% narrowing likely due to catheter induced spasm in 09/02/10.  Marland Kitchen CARDIAC CATHETERIZATION  09/02/2010   mod. nonobstructive disease in the RCA and CX, tortuous LAD  . COLONOSCOPY W/ POLYPECTOMY  03/17/11   diminutive polyp  . LOOP RECORDER EXPLANT N/A 01/14/2014   Procedure: LOOP RECORDER EXPLANT;  Surgeon: Sanda Klein, MD;  Location: [facility] CATH LAB;  Service: Cardiovascular;  Laterality: N/A;  . LOOP RECORDER IMPLANT  04/06/2012   Reveal XT [id number]  . LOOP RECORDER IMPLANT N/A 04/06/2012   Procedure: LOOP RECORDER IMPLANT;  Surgeon: Sanda Klein, MD;  Location: [facility] CATH LAB;  Service: Cardiovascular;  Laterality: N/A;  . NM MYOCAR PERF WALL MOTION  01/25/2008   mild anteroapical wall ischemia  . PATENT DUCTUS ARTERIOUS REPAIR     at age 29       Home Medications    Prior to Admission medications   Medication Sig Start Date End Date Taking? Authorizing Provider  albuterol (PROVENTIL) (2.5 MG/3ML) 0.083% nebulizer solution USE 1 VIAL VIA NEBULIZER  EVERY 6 HOURS AS NEEDED FOR WHEEZING OR SHORTNESS OF  BREATH 10/18/16  Yes [name], NP  aspirin 325 MG tablet Take 325 mg by mouth daily.   Yes Historical Provider, MD  cetirizine (ZYRTEC) 10 MG tablet Take 10 mg by mouth daily.   Yes Historical Provider, MD  clonazePAM (KLONOPIN) 1 MG tablet Take 1 tablet (1 mg total) by mouth daily. 10/20/16  Yes [name], NP  diclofenac sodium (VOLTAREN) 1 % GEL Apply 2 g topically 4 (four) times daily. 09/26/16  Yes [name], NP  ezetimibe (ZETIA) 10 MG tablet Take 1 tablet (10 mg total) by mouth daily. 06/12/16  Yes [name], NP  fenofibrate (TRICOR) 145 MG tablet Take 1 tablet by mouth  daily 08/10/15  Yes [name], NP  fish  oil-omega-3 fatty acids 1000 MG capsule Take 2 g by mouth daily.    Yes Historical Provider, MD  Fluticasone-Salmeterol (ADVAIR) 100-50 MCG/DOSE AEPB Inhale 1 puff into the lungs 2 (two) times daily. 11/11/15  Yes [name], NP  gabapentin (NEURONTIN) 300 MG capsule TAKE 1 CAPSULE BY MOUTH AT  6AM, 11:30AM AND 4:30PM AND TAKE 3 CAPSULES AT 10:00PM 05/30/16  Yes [name], NP  isosorbide mononitrate (IMDUR) 30 MG 24 hr tablet Take 1 tablet (30 mg total) by mouth daily. 10/26/16  Yes Jeffrey Hedges, PA-C  ketoconazole (NIZORAL) 2 % shampoo APPLY TOPICALLY DAILY 10/18/16  Yes [name], NP  lansoprazole (PREVACID) 15 MG capsule Take 15 mg by mouth daily.   Yes Historical Provider, MD  lisinopril (PRINIVIL,ZESTRIL) 5 MG tablet Take 7.5 mg by mouth daily.  05/27/16  Yes Historical Provider, MD  loperamide (IMODIUM) 2 MG capsule Take 1 capsule (2 mg total) by mouth as needed for diarrhea or loose stools. 01/20/16  Yes Merryl Hacker, MD  meloxicam (MOBIC) 7.5 MG tablet TAKE 1 TABLET BY MOUTH  DAILY 10/04/16  Yes [name], NP  metFORMIN (GLUCOPHAGE-XR) 500 MG 24 hr tablet Take 2 tablets by mouth  daily with breakfast 05/30/16  Yes [name], NP  metoprolol (LOPRESSOR) 50 MG tablet Take 1 tablet (50 mg total) by mouth once as directed. 11/04/16  Yes Mihai Croitoru, MD  montelukast (SINGULAIR) 10 MG tablet Take 1 tablet by mouth at  bedtime 05/30/16  Yes [name], NP  Multiple Vitamin (MULTIVITAMIN WITH MINERALS) TABS tablet Take 1 tablet by mouth daily.   Yes Historical Provider, MD  naproxen sodium (ALEVE) 220 MG tablet Take 440 mg by mouth 2 (two) times daily. Take 440 mg in the morning and 220 mg in the evening.   Yes Historical Provider, MD  ondansetron (ZOFRAN ODT) 4 MG disintegrating tablet Take 1 tablet (4 mg total) by mouth every 8 (eight) hours as needed for nausea or vomiting. 01/20/16  Yes Merryl Hacker, MD  ONETOUCH VERIO test strip CHECK BLOOD SUGAR  3 TIMES  DAILY 10/18/16  Yes [name], NP  ramelteon (ROZEREM) 8 MG tablet Take 1 tablet (8 mg total) by mouth at bedtime. 10/15/15  Yes [name], NP  rosuvastatin (CRESTOR) 40 MG tablet Take 1 tablet (40 mg total) by mouth daily. 06/08/16  Yes [name], NP  sertraline (ZOLOFT) 50 MG tablet TAKE 1 TABLET BY MOUTH  DAILY 10/18/16  Yes [name], NP  tamsulosin (FLOMAX) 0.4 MG CAPS capsule TAKE 1 CAPSULE BY MOUTH  DAILY 10/18/16  Yes [name], NP  tiZANidine (ZANAFLEX) 2 MG tablet TAKE 1 TABLET BY MOUTH  EVERY 6 HOURS AS NEEDED FOR MUSCLE SPASM(S) 10/17/16  Yes Charlett Blake, MD  traMADol (ULTRAM) 50 MG tablet Take 1 tablet (50 mg total) by mouth every 6 (six) hours as needed for severe pain. 10/17/16  Yes Charlett Blake, MD  traZODone (DESYREL) 100 MG tablet Take 1 tablet by mouth at  bedtime 05/30/16  Yes [name], NP  zolpidem (AMBIEN) 10 MG tablet Take 1 tablet (10 mg total) by mouth at bedtime as needed for sleep. 10/20/16  Yes [name], NP  LORazepam (ATIVAN) 1 MG tablet Take 1 tablet (1 mg total) by mouth once. Take 1 tablet (1 mg total) by mouth once 30 minutes-1 hour prior to cardiac MRI. 02/12/16   Dani Gobble Croitoru, MD  nitroGLYCERIN (NITROSTAT) 0.4 MG SL tablet Place 1 tablet (0.4 mg total) under the tongue every 5 (five) minutes as needed for chest pain. 11/11/15   [name], NP    Family History Family History  Problem Relation Age of Onset  . Colon cancer Paternal Grandfather   . Prostate cancer Paternal Grandfather   . Aneurysm Father   . Heart attack Father 75  . Coronary artery disease Father   . Colon polyps Mother   . Heart disease Mother   . Coronary artery disease Mother   . Migraines Mother   . Breast cancer Maternal Grandmother   . Colon cancer Paternal Uncle     x 2  . Stomach cancer Brother   . Liver disease      unsure who it was  . Allergies Daughter     Social History Social History    Substance Use Topics  . Smoking status: Never Smoker  . Smokeless tobacco: Never Used  . Alcohol use No     Allergies   Benadryl [diphenhydramine hcl] and Red blood cells   Review of Systems Review of Systems  Cardiovascular: Positive for chest pain.  Neurological: Positive for light-headedness and  numbness.  All other systems reviewed and are negative.    Physical Exam Updated Vital Signs BP 119/78   Pulse 85   Temp 97.7 F (36.5 C) (Oral)   Resp 11   Ht 5\' 8"  (1.727 m)   Wt 86.2 kg   SpO2 98%   BMI 28.89 kg/m   Physical Exam  Constitutional: He is oriented to person, place, and time. He appears well-developed and well-nourished. No distress.  HENT:  Head: Normocephalic and atraumatic.  Mouth/Throat: Uvula is midline, oropharynx is clear and moist and mucous membranes are normal. No oropharyngeal exudate, posterior oropharyngeal edema, posterior oropharyngeal erythema or tonsillar abscesses. No tonsillar exudate.  Eyes: Conjunctivae and EOM are normal. Right eye exhibits no discharge. Left eye exhibits no discharge. No scleral icterus.  Neck: Normal range of motion. Neck supple.  Cardiovascular: Normal rate, regular rhythm, normal heart sounds and intact distal pulses.   Pulses:      Radial pulses are 2+ on the right side, and 2+ on the left side.  Pulmonary/Chest: Effort normal and breath sounds normal. No respiratory distress. He has no wheezes. He has no rales. He exhibits tenderness (mild TTP over midsternal chest wall). He exhibits no laceration, no crepitus, no edema, no deformity, no swelling and no retraction.  Abdominal: Soft. Bowel sounds are normal. He exhibits no distension and no mass. There is no tenderness. There is no rebound and no guarding.  Musculoskeletal: Normal range of motion. He exhibits no edema.  Neurological: He is alert and oriented to person, place, and time. He has normal strength. No cranial nerve deficit or sensory deficit.  Sensation  grossly intact to BUE with light touch.  Skin: Skin is warm and dry. He is not diaphoretic.  Nursing note and vitals reviewed.    ED Treatments / Results  Labs (all labs ordered are listed, but only abnormal results are displayed) Labs Reviewed  CBC WITH DIFFERENTIAL/PLATELET - Abnormal; Notable for the following:       Result Value   RDW 16.2 (*)    All other components within normal limits  BASIC METABOLIC PANEL  I-STAT TROPOININ, ED  I-STAT TROPOININ, ED    EKG  EKG Interpretation  Date/Time:  Wednesday November 09 2016 10:39:20 EST Ventricular Rate:  76 PR Interval:    QRS Duration: 106 QT Interval:  354 QTC Calculation: 398 R Axis:   162 Text Interpretation:  Sinus rhythm S1,S2,S3 pattern Consider right ventricular hypertrophy ST elev, probable normal early repol pattern No significant change since last tracing Confirmed by [facility] MD, JASON [id number]) on 11/09/2016 11:09:11 AM       Radiology Dg Chest 2 View  Result Date: 11/09/2016 CLINICAL DATA:  Mid and right chest pain for 3 weeks. EXAM: CHEST  2 VIEW COMPARISON:  CT chest 11/09/2016.  PA and lateral chest 10/26/2016. FINDINGS: Lungs are clear. Heart size is normal. No pneumothorax or pleural effusion. No acute bony abnormality. IMPRESSION: Negative chest. Electronically Signed   By: Inge Rise M.D.   On: 11/09/2016 12:30   Ct Angio Chest Aorta W And/or Wo Contrast  Result Date: 11/09/2016 CLINICAL DATA:  45 year old male with mid chest pain, intermittent shortness of breath which is progress this morning. Initial encounter. Personal history of patent ductus arteriosus at 46 year of age. EXAM: CT ANGIOGRAPHY CHEST WITH CONTRAST TECHNIQUE: Multidetector CT imaging of the chest was performed using the standard protocol during bolus administration of intravenous contrast. Multiplanar CT image reconstructions and MIPs were obtained to  evaluate the vascular anatomy. CONTRAST:  100 mL Isovue 370 COMPARISON:  CT Abdomen  and Pelvis 12/26/2012 FINDINGS: Cardiovascular: Adequate contrast bolus timing in the aorta. No dissection or calcified atherosclerosis in the thoracic or visible upper abdominal aorta. 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 Central pulmonary arteries also appear patent. No calcified coronary artery atherosclerosis is evident. No cardiomegaly.  No pericardial effusion. Mediastinum/Nodes: Negative.  No lymphadenopathy. Lungs/Pleura: Major airways are patent. Incidental azygos fissure (normal variant). Mildly lower left lung volume compared to the right, but both lungs appear clear. No pleural effusion. Upper Abdomen: Negative visualized liver, spleen, pancreas, adrenal glands and kidneys. Contracted gallbladder. Major proximal abdominal aorta branches are patent. Negative visible bowel. No upper abdominal free fluid or free air. Musculoskeletal: No acute osseous abnormality identified. Review of the MIP images confirms the above findings. IMPRESSION: Negative chest CTA aside from mildly ectatic proximal descending aorta measuring 33 mm diameter. Electronically Signed   By: Genevie Ann M.D.   On: 11/09/2016 12:26    Procedures Procedures (including critical care time)  Medications Ordered in ED Medications  iopamidol (ISOVUE-370) 76 % injection (100 mLs  Contrast Given 11/09/16 1159)     Initial Impression / Assessment and Plan / ED Course  I have reviewed the triage vital signs and the nursing notes.  Pertinent labs & imaging results that were available during my care of the patient were reviewed by me and considered in my medical decision making (see chart for details).     Patient presents with chest pain that started at 3 AM. Reports associated left arm numbness and pain that radiates to his neck. Denies relief with aspirin or nitroglycerin taken at home prior to arrival. 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 Family hx of mother having hypertrophic cardiomyopathy. VSS. Exam showed mild TTP over midsternal chest wall, lungs CTAB. Remaining exam unremarkable. Pt declined pain medication at this time.   Chart review shows pt was seen by his cardiologist, Dr. Sallyanne Kuster, on 11/04/16 for ED follow up regarding his CP. Plan to order CT angiogram for 11/11/16. Discussed pt with Dr. Dayna Barker, plan to order cardiac workup and and CT angio chest for further evaluation of CP.  EKG showed sinus rhythm with no significant changes from prior. Troponin negative. Initial labs unremarkable. Chest x-ray negative. CT angio chest negative, noted to have mildly ectatic proximal descending aorta at 57mm in diameter. On reevaluation patient reports improvement of chest pain. Delta troponin negative. I have a low suspicion for ACS, PE, dissection, or other acute cardiac event at this time. Plan to discharge patient home with outpatient cardiology follow-up. Discussed return precautions.   Final Clinical Impressions(s) / ED Diagnoses   Final diagnoses:  Chest pain, unspecified type    New Prescriptions New Prescriptions   No medications on file     Nona Dell, PA-C 11/09/16 1509    Merrily Pew, MD 11/10/16 [phone]

## 2016-11-09 NOTE — Discharge Instructions (Signed)
Continue taking your home medications as prescribed. I recommend following up with your cardiologist within the next week for follow-up evaluation. Please return to the Emergency Department if symptoms worsen or new onset of fever, lightheadedness, new/worsening chest pain, difficulty breathing, heart palpitations, abdominal pain, vomiting, numbness, weakness, syncope.

## 2016-11-10 ENCOUNTER — Telehealth: Payer: Self-pay | Admitting: Cardiovascular Disease

## 2016-11-10 NOTE — Telephone Encounter (Signed)
lmtcb

## 2016-11-10 NOTE — Telephone Encounter (Signed)
Routed to [place], [place] as FYI to forward to Dr. Loletha Grayer if necessary

## 2016-11-10 NOTE — Telephone Encounter (Signed)
New message      Calling to let Dr Sallyanne Kuster know pt had CT done at the ER yesterday.

## 2016-11-10 NOTE — Telephone Encounter (Signed)
I saw that. Tell him that it is NOT the same protocol or equipment as for a coronary CT angio. MCr

## 2016-11-11 ENCOUNTER — Inpatient Hospital Stay: Admission: RE | Admit: 2016-11-11 | Payer: Self-pay | Source: Ambulatory Visit

## 2016-11-14 ENCOUNTER — Encounter: Payer: Self-pay | Admitting: Physical Medicine & Rehabilitation

## 2016-11-14 ENCOUNTER — Ambulatory Visit (HOSPITAL_BASED_OUTPATIENT_CLINIC_OR_DEPARTMENT_OTHER): Payer: Medicare Other | Admitting: Physical Medicine & Rehabilitation

## 2016-11-14 ENCOUNTER — Encounter: Payer: Medicare Other | Attending: Physical Medicine & Rehabilitation

## 2016-11-14 VITALS — BP 107/76 | HR 79

## 2016-11-14 DIAGNOSIS — Z800 Family history of malignant neoplasm of digestive organs: Secondary | ICD-10-CM | POA: Insufficient documentation

## 2016-11-14 DIAGNOSIS — E119 Type 2 diabetes mellitus without complications: Secondary | ICD-10-CM | POA: Diagnosis not present

## 2016-11-14 DIAGNOSIS — Z8249 Family history of ischemic heart disease and other diseases of the circulatory system: Secondary | ICD-10-CM | POA: Insufficient documentation

## 2016-11-14 DIAGNOSIS — K219 Gastro-esophageal reflux disease without esophagitis: Secondary | ICD-10-CM | POA: Diagnosis not present

## 2016-11-14 DIAGNOSIS — M7061 Trochanteric bursitis, right hip: Secondary | ICD-10-CM

## 2016-11-14 DIAGNOSIS — M47812 Spondylosis without myelopathy or radiculopathy, cervical region: Secondary | ICD-10-CM | POA: Insufficient documentation

## 2016-11-14 DIAGNOSIS — I2510 Atherosclerotic heart disease of native coronary artery without angina pectoris: Secondary | ICD-10-CM | POA: Diagnosis not present

## 2016-11-14 DIAGNOSIS — M961 Postlaminectomy syndrome, not elsewhere classified: Secondary | ICD-10-CM

## 2016-11-14 DIAGNOSIS — E785 Hyperlipidemia, unspecified: Secondary | ICD-10-CM | POA: Diagnosis not present

## 2016-11-14 DIAGNOSIS — R072 Precordial pain: Secondary | ICD-10-CM

## 2016-11-14 DIAGNOSIS — M797 Fibromyalgia: Secondary | ICD-10-CM | POA: Diagnosis not present

## 2016-11-14 DIAGNOSIS — M545 Low back pain: Secondary | ICD-10-CM | POA: Diagnosis not present

## 2016-11-14 DIAGNOSIS — M542 Cervicalgia: Secondary | ICD-10-CM | POA: Insufficient documentation

## 2016-11-14 DIAGNOSIS — Z803 Family history of malignant neoplasm of breast: Secondary | ICD-10-CM | POA: Diagnosis not present

## 2016-11-14 DIAGNOSIS — I10 Essential (primary) hypertension: Secondary | ICD-10-CM | POA: Insufficient documentation

## 2016-11-14 NOTE — Progress Notes (Signed)
Subjective:    Patient ID: Cody Raymond, male    DOB: 11-13-1970, 46 y.o.   MRN: [id number]  HPI 46 year old male with history of fibromyalgia as well as chronic trochanteric bursitis of the right hip. He had only a few days relief with the trochanteric bursa injection. Interval medical history is significant for chest pain, which ruled out for MI. Had to ED visits. 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 Patient is concerned about that, tried to call his cardiologist, but did not get through. He noticed that his former cardiologist is now in this same medical office building.  In regards to his fibromyalgia pain. He currently has widespread body pain, "everything hurts.", no falls, no trauma. No new activities. Has been losing weight. Trying to keep diabetes and high blood pressure under good control.  Pain Inventory Average Pain 8 Pain Right Now 8 My pain is sharp, burning, dull, stabbing, tingling and aching  In the last 24 hours, has pain interfered with the following? General activity 8 Relation with others 8 Enjoyment of life 8 What TIME of day is your pain at its worst? all Sleep (in general) Fair  Pain is worse with: walking, bending, sitting, inactivity and standing Pain improves with: heat/ice and medication Relief from Meds: 4  Mobility walk with assistance use a cane ability to climb steps?  no do you drive?  yes  Function disabled: date disabled . I need assistance with the following:  bathing  Neuro/Psych weakness numbness tremor tingling dizziness confusion depression anxiety  Prior Studies Any changes since last visit?  no  Physicians involved in your care Any changes since last visit?  no   Family History  Problem Relation Age of Onset  . Colon cancer Paternal Grandfather   . Prostate cancer Paternal Grandfather   . Aneurysm Father   . Heart attack  Father 24  . Coronary artery disease Father   . Colon polyps Mother   . Heart disease Mother   . Coronary artery disease Mother   . Migraines Mother   . Breast cancer Maternal Grandmother   . Colon cancer Paternal Uncle     x 2  . Stomach cancer Brother   . Liver disease      unsure who it was  . Allergies Daughter    Social History   Social History  . Marital status: Married    Spouse name: N/A  . Number of children: N/A  . Years of education: N/A   Social History Main Topics  . Smoking status: Never Smoker  . Smokeless tobacco: Never Used  . Alcohol use No  . Drug use: No  . Sexual activity: Not on file   Other Topics Concern  . Not on file   Social History Narrative   Holter monitor 08/2010: PVCs and sinus tachy.   Sleep Study (02/2008): mild sleep apnea, no indication for CPAP.   Past Surgical History:  Procedure Laterality Date  . APPENDECTOMY    . BACK SURGERY    . CARDIAC CATHETERIZATION  2007   Clean Cardiac Cath ([place]), with RCA 30% narrowing likely due to catheter induced spasm in 09/02/10.  Marland Kitchen CARDIAC CATHETERIZATION  09/02/2010   mod. nonobstructive disease in the RCA and CX, tortuous LAD  . COLONOSCOPY W/ POLYPECTOMY  03/17/11   diminutive polyp  . LOOP RECORDER EXPLANT N/A 01/14/2014   Procedure:  LOOP RECORDER EXPLANT;  Surgeon: Sanda Klein, MD;  Location: [place] [facility]ATH [facility];  Service: Cardiovascular;  Laterality: N/A;  . LOOP RECORDER IMPLANT  04/06/2012   Reveal XT [id number]  . LOOP RECORDER IMPLANT N/A 04/06/2012   Procedure: LOOP RECORDER IMPLANT;  Surgeon: Sanda Klein, MD;  Location: [place] [facility];  Service: Cardiovascular;  Laterality: N/A;  . NM MYOCAR PERF WALL MOTION  01/25/2008   mild anteroapical wall ischemia  . PATENT DUCTUS ARTERIOUS REPAIR     at age 15   Past Medical History:  Diagnosis Date  . Asthma   . Blood transfusion   . CAD (coronary artery disease), non obstructive on cath 2011 04/05/2012  . Coronary artery disease   .  Diabetes mellitus   . Enlarged prostate   . GERD (gastroesophageal reflux disease)   . H/O syncope   . Heart disease   . History of repair of patent ductus arteriosus 07/23/2016  . Hyperlipidemia   . Hypertension   . 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 Refusal of blood transfusions as patient is Jehovah's Witness    There were no vitals taken for this visit.  Opioid Risk Score:   Fall Risk Score:  `1  Depression screen PHQ 2/9  Depression screen [facility] 2/9 06/08/2016 04/20/2016 12/25/2014  Decreased Interest 1 0 2  Down, Depressed, Hopeless 1 1 1   PHQ - 2 Score 2 1 3   Altered sleeping 3 - 1  Tired, decreased energy 1 - 1  Change in appetite 0 - 0  Feeling bad or failure about yourself  (No Data) - 2  Trouble concentrating (No Data) - 2  Moving slowly or fidgety/restless (No Data) - 2  Suicidal thoughts 0 - 0  PHQ-9 Score 6 - 11  Difficult doing work/chores Somewhat difficult - -  Some recent data might be hidden    Review of Systems  Constitutional: Positive for unexpected weight change.  HENT: Negative.   Eyes: Negative.   Respiratory: Negative.   Cardiovascular: Negative.   Gastrointestinal: Negative.   Endocrine: Negative.   Genitourinary: Negative.   Musculoskeletal: Positive for back pain, gait problem and neck pain.  Skin: Negative.   Allergic/Immunologic: Negative.   Neurological: Positive for dizziness, tremors, weakness and numbness.  Psychiatric/Behavioral: Positive for confusion and dysphoric mood. The patient is nervous/anxious.   All other systems reviewed and are negative.      Objective:   Physical Exam  Constitutional: He is oriented to person, place, and time. He appears well-developed and well-nourished.  HENT:  Head: Normocephalic and atraumatic.  Eyes: Conjunctivae and EOM are normal. Pupils are equal, round, and reactive to light.  Neck: Normal range of motion.  Neurological: He is alert and oriented to person, place, and time.  Psychiatric:  He has a normal mood and affect.  Nursing note and vitals reviewed.  Tenderness to palpation over the cervical, thoracic, lumbar spine, as well as bilateral lateral epicondyle areas, bilateral trochanteric bursa area       Assessment & Plan:  1. Fibromyalgia syndrome. I think this is his primary pain diagnosis. We discussed dosing on gabapentin. While he takes a higher dose of gabapentin at night. His daytime doses are moderate. Recommend increase gabapentin to 600 mg 3 times a day and 900 mg at night. If this is not helpful after approximately one month, I would recommend a trial of Lyrica as a wean of gabapentin  2. Lumbar postlaminectomy syndrome. Has not responded well to spinal  injections in the past. Continue tramadol 50 mg 4 times a day.  3. Trochanteric bursitis. Question if some of this may be related to fibromyalgia as well. Will not reinject at this time unless he has some type of severe exacerbation which limits his ability to sleep on his side. We discussed other potential options including Zilretta

## 2016-11-14 NOTE — Patient Instructions (Signed)
Increase gabapentin to 2 tablets 3 times a day and continue 3 tab at night

## 2016-11-15 ENCOUNTER — Telehealth: Payer: Self-pay | Admitting: Family

## 2016-11-15 MED ORDER — EZETIMIBE 10 MG PO TABS: 10.0000 mg | ORAL_TABLET | Freq: Every day | ORAL | 1 refills | Status: DC

## 2016-11-15 MED ORDER — LISINOPRIL 10 MG PO TABS: 10.0000 mg | ORAL_TABLET | Freq: Every day | ORAL | 1 refills | Status: DC

## 2016-11-15 NOTE — Telephone Encounter (Signed)
Came with wife for her appointment.  Requests refills.

## 2016-11-16 ENCOUNTER — Telehealth: Payer: Self-pay | Admitting: *Deleted

## 2016-11-16 NOTE — Telephone Encounter (Signed)
Called pharmacy to confirm pt is taking Lisinopril 10mg  not lisinopril 2.5mg .

## 2016-11-16 NOTE — Telephone Encounter (Signed)
Received fax from [facility] that pt had previously received lisinopril 2.5mg  Rx from Dr Sallyanne Kuster and 10mg  Rx from [place], NP and they want to know which dose pt should be taking. Per 10/26/16 phone note with Dr Sallyanne Kuster, pt was advised to increased lisinopril to 10mg  and PCP sent Rx on 11/13/16. Faxed paper back to [facility] to d/c the 2.5mg  Rx.

## 2016-11-16 NOTE — Telephone Encounter (Signed)
Yes, continue 10mg  please.

## 2016-11-18 ENCOUNTER — Other Ambulatory Visit: Payer: Self-pay | Admitting: Family

## 2016-11-21 ENCOUNTER — Other Ambulatory Visit: Payer: Self-pay | Admitting: Cardiovascular Disease

## 2016-11-21 ENCOUNTER — Encounter: Payer: Self-pay | Admitting: Family

## 2016-11-21 ENCOUNTER — Other Ambulatory Visit: Payer: Self-pay | Admitting: Family

## 2016-11-21 ENCOUNTER — Other Ambulatory Visit: Payer: Self-pay | Admitting: Physical Medicine & Rehabilitation

## 2016-11-21 ENCOUNTER — Other Ambulatory Visit: Payer: Self-pay

## 2016-11-21 MED ORDER — TIZANIDINE HCL 2 MG PO TABS: ORAL_TABLET | ORAL | 2 refills | Status: DC

## 2016-11-21 NOTE — Telephone Encounter (Signed)
Please contact pt to arrange OV.  OK to set up with another provider if I don't have any openings. (see mychart message).

## 2016-11-22 ENCOUNTER — Ambulatory Visit (HOSPITAL_BASED_OUTPATIENT_CLINIC_OR_DEPARTMENT_OTHER)
Admission: RE | Admit: 2016-11-22 | Discharge: 2016-11-22 | Disposition: A | Discharge status: Home / Self Care | Payer: Medicare Other | Source: Ambulatory Visit | Attending: Medical | Admitting: Medical

## 2016-11-22 ENCOUNTER — Ambulatory Visit (INDEPENDENT_AMBULATORY_CARE_PROVIDER_SITE_OTHER): Payer: Medicare Other | Admitting: Medical

## 2016-11-22 ENCOUNTER — Encounter: Payer: Self-pay | Admitting: Medical

## 2016-11-22 ENCOUNTER — Other Ambulatory Visit: Payer: Self-pay | Admitting: *Deleted

## 2016-11-22 VITALS — BP 100/67 | Temp 98.0°F | Resp 16 | Ht 68.0 in | Wt 187.5 lb

## 2016-11-22 DIAGNOSIS — J40 Bronchitis, not specified as acute or chronic: Secondary | ICD-10-CM

## 2016-11-22 DIAGNOSIS — R062 Wheezing: Secondary | ICD-10-CM

## 2016-11-22 DIAGNOSIS — J0100 Acute maxillary sinusitis, unspecified: Secondary | ICD-10-CM

## 2016-11-22 DIAGNOSIS — R05 Cough: Secondary | ICD-10-CM

## 2016-11-22 DIAGNOSIS — R6889 Other general symptoms and signs: Secondary | ICD-10-CM

## 2016-11-22 DIAGNOSIS — R059 Cough, unspecified: Secondary | ICD-10-CM

## 2016-11-22 LAB — POC INFLUENZA A&B (BINAX/QUICKVUE)
Influenza A, POC: NEGATIVE
Influenza B, POC: NEGATIVE

## 2016-11-22 LAB — POCT RAPID STREP A (OFFICE): Rapid Strep A Screen: NEGATIVE

## 2016-11-22 MED ORDER — PREDNISONE 10 MG PO TABS: ORAL_TABLET | ORAL | 0 refills | Status: DC

## 2016-11-22 MED ORDER — DOXYCYCLINE HYCLATE 100 MG PO TABS: 100.0000 mg | ORAL_TABLET | Freq: Two times a day (BID) | ORAL | 0 refills | Status: DC

## 2016-11-22 MED ORDER — BENZONATATE 100 MG PO CAPS: ORAL_CAPSULE | ORAL | 0 refills | Status: DC

## 2016-11-22 MED ORDER — TIZANIDINE HCL 2 MG PO TABS: ORAL_TABLET | ORAL | 2 refills | Status: DC

## 2016-11-22 MED ORDER — FLUTICASONE-SALMETEROL 100-50 MCG/DOSE IN AEPB: 1.0000 | INHALATION_SPRAY | Freq: Two times a day (BID) | RESPIRATORY_TRACT | 3 refills | Status: DC

## 2016-11-22 NOTE — Progress Notes (Signed)
Subjective:    Patient ID: Cody Raymond, male    DOB: 1971-01-01, 46 y.o.   MRN: [id number]  HPI  Pt has been sick since Friday.Pt currently has nasal congestion head and chest congestion. Some pnd. Pt states coughing up a lot of mucous. He states chunks of mucus. Has faint itchiness to throat. Pt has been wheezing. Using neb machine every 4-6 hours for a couple of days.   No diffuse body aches, no fever, no chills or sweats.  Pt is diabetic and sugars recently 110 on average when checks.   Review of Systems  Constitutional: Negative for chills, fatigue and fever.  HENT: Positive for congestion, postnasal drip and sore throat. Negative for ear pain.   Respiratory: Positive for cough and wheezing. Negative for chest tightness.   Cardiovascular: Negative for chest pain and palpitations.  Gastrointestinal: Negative for abdominal pain.  Genitourinary: Negative for dysuria, flank pain, frequency, penile pain, penile swelling and testicular pain.  Musculoskeletal: Negative for back pain and myalgias.  Skin: Negative for rash.  Neurological: Negative for dizziness, weakness and headaches.  Hematological: Negative for adenopathy. Does not bruise/bleed easily.  Psychiatric/Behavioral: Negative for behavioral problems and confusion.    Past Medical History:  Diagnosis Date  . Asthma   . Blood transfusion   . CAD (coronary artery disease), non obstructive on cath 2011 04/05/2012  . Coronary artery disease   . Diabetes mellitus   . Enlarged prostate   . GERD (gastroesophageal reflux disease)   . H/O syncope   . Heart disease   . History of repair of patent ductus arteriosus 07/23/2016  . Hyperlipidemia   . Hypertension   . 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 Refusal of blood transfusions as patient is Jehovah's Witness      Social History   Social History  . Marital status: Married    Spouse name: N/A  . Number of children: N/A  . Years of education: N/A   Occupational  History  . Not on file.   Social History Main Topics  . Smoking status: Never Smoker  . Smokeless tobacco: Never Used  . Alcohol use No  . Drug use: No  . Sexual activity: Not on file   Other Topics Concern  . Not on file   Social History Narrative   Holter monitor 08/2010: PVCs and sinus tachy.   Sleep Study (02/2008): mild sleep apnea, no indication for CPAP.    Past Surgical History:  Procedure Laterality Date  . APPENDECTOMY    . BACK SURGERY    . CARDIAC CATHETERIZATION  2007   Clean Cardiac Cath ([facility]), with RCA 30% narrowing likely due to catheter induced spasm in 09/02/10.  Marland Kitchen CARDIAC CATHETERIZATION  09/02/2010   mod. nonobstructive disease in the RCA and CX, tortuous LAD  . COLONOSCOPY W/ POLYPECTOMY  03/17/11   diminutive polyp  . LOOP RECORDER EXPLANT N/A 01/14/2014   Procedure: LOOP RECORDER EXPLANT;  Surgeon: Sanda Klein, MD;  Location: [place] C[facility];  Service: Cardiovascular;  Laterality: N/A;  . LOOP RECORDER IMPLANT  04/06/2012   Reveal XT [id number]  . LOOP RECORDER IMPLANT N/A 04/06/2012   Procedure: LOOP RECORDER IMPLANT;  Surgeon: Sanda Klein, MD;  Location: [place] C[facility] LAB;  Service: Cardiovascular;  Laterality: N/A;  . NM MYOCAR PERF WALL MOTION  01/25/2008   mild anteroapical wall ischemia  . PATENT DUCTUS ARTERIOUS REPAIR     at age 68    Family History  Problem  Relation Age of Onset  . Colon cancer Paternal Grandfather   . Prostate cancer Paternal Grandfather   . Aneurysm Father   . Heart attack Father 53  . Coronary artery disease Father   . Colon polyps Mother   . Heart disease Mother   . Coronary artery disease Mother   . Migraines Mother   . Breast cancer Maternal Grandmother   . Colon cancer Paternal Uncle     x 2  . Stomach cancer Brother   . Liver disease      unsure who it was  . Allergies Daughter     Allergies  Allergen Reactions  . Benadryl [Diphenhydramine Hcl]     "drives me nuts"  . Red Blood Cells     PT. REFUSES  ANY BLOOD PRODUCTS - JEHOVAH'S WITNESS.    Current Outpatient Prescriptions on File Prior to Visit  Medication Sig Dispense Refill  . albuterol (PROVENTIL) (2.5 MG/3ML) 0.083% nebulizer solution USE 1 VIAL VIA NEBULIZER  EVERY 6 HOURS AS NEEDED FOR WHEEZING OR SHORTNESS OF  BREATH 75 mL 1  . aspirin 325 MG tablet Take 325 mg by mouth daily.    . cetirizine (ZYRTEC) 10 MG tablet Take 10 mg by mouth daily.    . clonazePAM (KLONOPIN) 1 MG tablet Take 1 tablet (1 mg total) by mouth daily. 90 tablet 0  . diclofenac sodium (VOLTAREN) 1 % GEL Apply 2 g topically 4 (four) times daily. 720 g 0  . ezetimibe (ZETIA) 10 MG tablet Take 1 tablet (10 mg total) by mouth daily. 90 tablet 1  . fenofibrate (TRICOR) 145 MG tablet Take 1 tablet by mouth  daily 90 tablet 1  . fish oil-omega-3 fatty acids 1000 MG capsule Take 2 g by mouth daily.     . Fluticasone-Salmeterol (ADVAIR) 100-50 MCG/DOSE AEPB Inhale 1 puff into the lungs 2 (two) times daily. 1 each 3  . gabapentin (NEURONTIN) 300 MG capsule TAKE 1 CAPSULE BY MOUTH AT  6AM, 11:30AM AND 4:30PM AND TAKE 3 CAPSULES AT 10:00PM 540 capsule 1  . isosorbide mononitrate (IMDUR) 30 MG 24 hr tablet Take 1 tablet (30 mg total) by mouth daily. 30 tablet 0  . ketoconazole (NIZORAL) 2 % shampoo APPLY TOPICALLY DAILY 360 mL 1  . lansoprazole (PREVACID) 15 MG capsule Take 15 mg by mouth daily.    Marland Kitchen lisinopril (PRINIVIL,ZESTRIL) 10 MG tablet Take 1 tablet (10 mg total) by mouth daily. 90 tablet 1  . loperamide (IMODIUM) 2 MG capsule Take 1 capsule (2 mg total) by mouth as needed for diarrhea or loose stools. 12 capsule 0  . LORazepam (ATIVAN) 1 MG tablet Take 1 tablet (1 mg total) by mouth once. Take 1 tablet (1 mg total) by mouth once 30 minutes-1 hour prior to cardiac MRI. 1 tablet 0  . meloxicam (MOBIC) 7.5 MG tablet TAKE 1 TABLET BY MOUTH  DAILY 42 tablet 0  . metFORMIN (GLUCOPHAGE-XR) 500 MG 24 hr tablet Take 2 tablets by mouth  daily with breakfast 180 tablet 1  .  metoprolol (LOPRESSOR) 50 MG tablet Take 1 tablet (50 mg total) by mouth once as directed. 1 tablet 0  . montelukast (SINGULAIR) 10 MG tablet Take 1 tablet by mouth at  bedtime 90 tablet 1  . Multiple Vitamin (MULTIVITAMIN WITH MINERALS) TABS tablet Take 1 tablet by mouth daily.    . naproxen sodium (ALEVE) 220 MG tablet Take 440 mg by mouth 2 (two) times daily. Take 440 mg in the  morning and 220 mg in the evening.    . nitroGLYCERIN (NITROSTAT) 0.4 MG SL tablet Place 1 tablet (0.4 mg total) under the tongue every 5 (five) minutes as needed for chest pain. 30 tablet 1  . ondansetron (ZOFRAN ODT) 4 MG disintegrating tablet Take 1 tablet (4 mg total) by mouth every 8 (eight) hours as needed for nausea or vomiting. 20 tablet 0  . ONETOUCH VERIO test strip CHECK BLOOD SUGAR 3 TIMES  DAILY 300 each 1  . ramelteon (ROZEREM) 8 MG tablet Take 1 tablet (8 mg total) by mouth at bedtime. 30 tablet 5  . rosuvastatin (CRESTOR) 40 MG tablet Take 1 tablet (40 mg total) by mouth daily. 30 tablet 5  . sertraline (ZOLOFT) 50 MG tablet TAKE 1 TABLET BY MOUTH  DAILY 90 tablet 1  . tamsulosin (FLOMAX) 0.4 MG CAPS capsule TAKE 1 CAPSULE BY MOUTH  DAILY 90 capsule 1  . tiZANidine (ZANAFLEX) 2 MG tablet TAKE 1 TABLET BY MOUTH  EVERY 6 HOURS AS NEEDED FOR MUSCLE SPASM(S) 270 tablet 2  . traMADol (ULTRAM) 50 MG tablet Take 1 tablet (50 mg total) by mouth every 6 (six) hours as needed for severe pain. 120 tablet 5  . traZODone (DESYREL) 100 MG tablet Take 1 tablet by mouth at  bedtime 90 tablet 1  . zolpidem (AMBIEN) 10 MG tablet Take 1 tablet (10 mg total) by mouth at bedtime as needed for sleep. 90 tablet 0  . [DISCONTINUED] niacin (NIASPAN) 1000 MG CR tablet Take 1 tablet (1,000 mg total) by mouth at bedtime. 30 tablet 6   No current facility-administered medications on file prior to visit.     BP 100/67 (BP Location: Left Arm, Patient Position: Sitting, Cuff Size: Large)   Temp 98 F (36.7 C) (Oral)   Resp 16   Ht  5\' 8"  (1.727 m)   Wt 187 lb 8 oz (85 kg)   SpO2 100%   BMI 28.51 kg/m       Objective:   Physical Exam  General  Mental Status - Alert. General Appearance - Well groomed. Not in acute distress.  Skin Rashes- No Rashes.  HEENT Head- Normal. Ear Auditory Canal - Left- Normal. Right - Normal.Tympanic Membrane- Left- Normal. Right- Normal. Eye Sclera/Conjunctiva- Left- Normal. Right- Normal. Nose & Sinuses Nasal Mucosa- Left-  Boggy and Congested. Right-  Boggy and  Congested.Bilateral maxillary and frontal sinus pressure. Mouth & Throat Lips: Upper Lip- Normal: no dryness, cracking, pallor, cyanosis, or vesicular eruption. Lower Lip-Normal: no dryness, cracking, pallor, cyanosis or vesicular eruption. Buccal Mucosa- Bilateral- No Aphthous ulcers. Oropharynx- No Discharge or Erythema. Tonsils: Characteristics- Bilateral- No Erythema or Congestion. Size/Enlargement- Bilateral- No enlargement. Discharge- bilateral-None.  Neck Neck- Supple. No Masses.   Chest and Lung Exam Auscultation: Breath Sounds:-even and unlabored. But scattered wheezing throughout lung fields.  Cardiovascular Auscultation:Rythm- Regular, rate and rhythm. Murmurs & Other Heart Sounds:Ausculatation of the heart reveal- No Murmurs.  Lymphatic Head & Neck General Head & Neck Lymphatics: Bilateral: Description- No Localized lymphadenopathy.       Assessment & Plan:   You appear to have bronchitis and sinusitis. Rest hydrate and tylenol for fever. I am prescribing cough medicine benzonatate, and doxycycline antibiotic.   For wheezing rx refill of your advair, use tapered prednisone and neb machine tx if needed. Low sugar diet while on prednisone.   Please get cxr today. (Evaluate if pneumonia present)  Follow up in 7-10 days or as needed  Rapid flu test  and strep test was negative  Desarai Barrack, Percell Miller, PA-C

## 2016-11-22 NOTE — Progress Notes (Signed)
Pre visit review using our clinic review tool, if applicable. No additional management support is needed unless otherwise documented below in the visit note/SLS  

## 2016-11-22 NOTE — Patient Instructions (Signed)
You appear to have bronchitis and sinusitis. Rest hydrate and tylenol for fever. I am prescribing cough medicine benzonatate, and doxycycline antibiotic.   For wheezing rx refill of your advair, use tapered prednisone and neb machine tx if needed. Low sugar diet while on prednisone.   Please get cxr today. (Evaluate if pneumonia present)  Follow up in 7-10 days or as needed

## 2016-11-23 ENCOUNTER — Other Ambulatory Visit: Payer: Self-pay | Admitting: Family

## 2016-12-18 ENCOUNTER — Encounter: Payer: Self-pay | Admitting: Family

## 2016-12-21 ENCOUNTER — Emergency Department (HOSPITAL_COMMUNITY): Payer: Medicare Other

## 2016-12-21 ENCOUNTER — Emergency Department (HOSPITAL_COMMUNITY)
Admission: EM | Admit: 2016-12-21 | Discharge: 2016-12-21 | Disposition: A | Discharge status: Home / Self Care | Payer: Medicare Other | Attending: Emergency Medicine | Admitting: Emergency Medicine

## 2016-12-21 ENCOUNTER — Encounter (HOSPITAL_COMMUNITY): Payer: Self-pay

## 2016-12-21 DIAGNOSIS — I10 Essential (primary) hypertension: Secondary | ICD-10-CM | POA: Insufficient documentation

## 2016-12-21 DIAGNOSIS — Z7984 Long term (current) use of oral hypoglycemic drugs: Secondary | ICD-10-CM | POA: Insufficient documentation

## 2016-12-21 DIAGNOSIS — Z7982 Long term (current) use of aspirin: Secondary | ICD-10-CM | POA: Insufficient documentation

## 2016-12-21 DIAGNOSIS — R42 Dizziness and giddiness: Secondary | ICD-10-CM | POA: Insufficient documentation

## 2016-12-21 DIAGNOSIS — R079 Chest pain, unspecified: Secondary | ICD-10-CM | POA: Diagnosis present

## 2016-12-21 DIAGNOSIS — E1140 Type 2 diabetes mellitus with diabetic neuropathy, unspecified: Secondary | ICD-10-CM | POA: Insufficient documentation

## 2016-12-21 DIAGNOSIS — J45909 Unspecified asthma, uncomplicated: Secondary | ICD-10-CM | POA: Diagnosis not present

## 2016-12-21 DIAGNOSIS — I2510 Atherosclerotic heart disease of native coronary artery without angina pectoris: Secondary | ICD-10-CM | POA: Diagnosis not present

## 2016-12-21 DIAGNOSIS — Z79899 Other long term (current) drug therapy: Secondary | ICD-10-CM | POA: Insufficient documentation

## 2016-12-21 DIAGNOSIS — R0789 Other chest pain: Secondary | ICD-10-CM | POA: Diagnosis not present

## 2016-12-21 HISTORY — DX: Aneurysm of unspecified site: I72.9

## 2016-12-21 LAB — CBC
HCT: 38.4 % — ABNORMAL LOW (ref 39.0–52.0)
Hemoglobin: 13 g/dL (ref 13.0–17.0)
MCH: 26.6 pg (ref 26.0–34.0)
MCHC: 33.9 g/dL (ref 30.0–36.0)
MCV: 78.5 fL (ref 78.0–100.0)
PLATELETS: 268 10*3/uL (ref 150–400)
RBC: 4.89 MIL/uL (ref 4.22–5.81)
RDW: 16 % — AB (ref 11.5–15.5)
WBC: 6.3 10*3/uL (ref 4.0–10.5)

## 2016-12-21 LAB — BASIC METABOLIC PANEL
Anion gap: 9 (ref 5–15)
BUN: 13 mg/dL (ref 6–20)
CALCIUM: 8.9 mg/dL (ref 8.9–10.3)
CO2: 23 mmol/L (ref 22–32)
CREATININE: 0.86 mg/dL (ref 0.61–1.24)
Chloride: 104 mmol/L (ref 101–111)
GFR calc Af Amer: >60 mL/min (ref >60)
GFR calc non Af Amer: >60 mL/min (ref >60)
Glucose, Bld: 105 mg/dL — ABNORMAL HIGH (ref 65–99)
Potassium: 3.7 mmol/L (ref 3.5–5.1)
Sodium: 136 mmol/L (ref 135–145)

## 2016-12-21 LAB — I-STAT TROPONIN, ED
TROPONIN I, POC: 0 ng/mL (ref 0.00–0.08)
Troponin i, poc: 0 ng/mL (ref 0.00–0.08)

## 2016-12-21 MED ORDER — IOPAMIDOL (ISOVUE-370) INJECTION 76%
INTRAVENOUS | Status: AC
  Administered 2016-12-21: 100 mL
  Filled 2016-12-21: qty 100

## 2016-12-21 MED ORDER — SODIUM CHLORIDE 0.9 % IV BOLUS (SEPSIS)
1000.0000 mL | Freq: Once | INTRAVENOUS | Status: AC
  Administered 2016-12-21: 1000 mL via INTRAVENOUS

## 2016-12-21 MED ORDER — DIAZEPAM 5 MG PO TABS
5.0000 mg | ORAL_TABLET | Freq: Once | ORAL | Status: DC
Start: 2016-12-21 — End: 2016-12-21
  Filled 2016-12-21: qty 1

## 2016-12-21 MED ORDER — ONDANSETRON HCL 4 MG/2ML IJ SOLN
4.0000 mg | Freq: Once | INTRAMUSCULAR | Status: AC
  Administered 2016-12-21: 4 mg via INTRAVENOUS
  Filled 2016-12-21: qty 2

## 2016-12-21 MED ORDER — MORPHINE SULFATE (PF) 4 MG/ML IV SOLN
4.0000 mg | Freq: Once | INTRAVENOUS | Status: AC
  Administered 2016-12-21: 4 mg via INTRAVENOUS
  Filled 2016-12-21: qty 1

## 2016-12-21 NOTE — ED Notes (Signed)
Delay in lab draw pt enroute to xray. 

## 2016-12-21 NOTE — ED Triage Notes (Signed)
Pt here for chest pain and dizzy. Onset today, initially started as vertigo and then started having chest pain. To left chest with no radiation per ems . Pt does have hx of aneurysym with similar symptoms. On arrival here chest pain in center of chest and radiaiton to neck took 1 ntg by ems and 1 adult asa pta.

## 2016-12-21 NOTE — ED Provider Notes (Signed)
[facility] DEPT Provider Note   CSN: [id number] Arrival date & time: 12/21/16  0153  By signing my name below, I, Arianna Nassar, attest that this documentation has been prepared under the direction and in the presence of Merryl Hacker, MD.  Electronically Signed: Julien Nordmann, ED Scribe. 12/21/16. 2:19 AM.    History   Chief Complaint Chief Complaint  Patient presents with  . Chest Pain  . Dizziness   The history is provided by the patient and the EMS personnel. No language interpreter was used.   HPI Comments: Cody Raymond is a 46 y.o. male who has a PMhx of aortic aneurysm, CAD, DM, GERD, HLD, HTN, neuromuscular disorder presents to the Emergency Department complaining of acute onset, moderate, central chest pain that began PTA. He describes his chest pain as a pressure-like, sharp sensation. Pt rates his current pain an 8/10. Pt reports associated shortness of breath, generalized weakness and posterior neck pain. He reports his symptoms started as dizziness that he described as room-spinning and off-balance ~ 5 hours ago that was worse with position changes. He notes he took a vertigo pill for his symptoms before sleeping because he thought that is what was causing his symptoms. Pt says he has felt similar symptoms in the past when he diagnosed with an aneurysm. He received 1 NTG and an adult ASA by EMS which did not alleviate his symptoms. He denies diaphoresis, numbness, and tingling.  Past Medical History:  Diagnosis Date  . Aneurysm ([place])   . Asthma   . Blood transfusion   . CAD (coronary artery disease), non obstructive on cath 2011 04/05/2012  . Coronary artery disease   . Diabetes mellitus   . Enlarged prostate   . GERD (gastroesophageal reflux disease)   . H/O syncope   . Heart disease   . History of repair of patent ductus arteriosus 07/23/2016  . Hyperlipidemia   . Hypertension   . 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 Refusal of blood transfusions as  patient is Jehovah's Witness     Patient Active Problem List   Diagnosis Date Noted  . Trochanteric bursitis of right hip 10/17/2016  . History of repair of patent ductus arteriosus 07/23/2016  . Cervicalgia 07/08/2016  . Fibromyalgia 07/08/2016  . Lumbar facet joint pain 07/08/2016  . Cervical spondylosis without myelopathy 07/08/2016  . HTN (hypertension) 02/08/2016  . Preventative health care 11/11/2015  . Lumbar facet arthropathy 10/19/2015  . Pes planus of both feet 06/22/2015  . Chronic lumbar radiculopathy 06/22/2015  . Elbow pain 04/17/2015  . Right inguinal pain 01/19/2015  . History of syncope 10/20/2014  . Urinary frequency 07/08/2014  . Allergic rhinitis 07/08/2014  . LLQ abdominal pain 11/20/2013  . Lumbar radiculopathy, chronic 07/26/2013  . Achilles tendinitis of right lower extremity 03/18/2013  . Morton's neuroma of left foot 03/18/2013  . Postlaminectomy syndrome, lumbar region 03/18/2013  . Depression 12/21/2012  . Skin tag 10/27/2012  . Insomnia 07/23/2012  . CAD (coronary artery disease), non obstructive on cath 2011 04/05/2012  . Syncope 04/04/2012  . Right bundle branch block 04/04/2012  . Neuropathy of left lower extremity 04/04/2012  . Anxiety 05/03/2011  . Chronic back pain 03/28/2011  . Palpitations 03/01/2011  . Type 2 diabetes, controlled, with peripheral neuropathy ([place]) 09/07/2010  . GERD 09/07/2010  . Coronary artery spasm ([place]) 01/31/2008  . Hypertriglyceridemia 11/16/2006  . OBESITY, NOS 11/16/2006  . ERECTILE DYSFUNCTION 11/16/2006  . Asthma with acute exacerbation 11/16/2006  Past Surgical History:  Procedure Laterality Date  . APPENDECTOMY    . BACK SURGERY    . CARDIAC CATHETERIZATION  2007   Clean Cardiac Cath ([place]), with RCA 30% narrowing likely due to catheter induced spasm in 09/02/10.  [facility] Kitchen CARDIAC CATHETERIZATION  09/02/2010   mod. nonobstructive disease in the RCA and CX, tortuous LAD  . COLONOSCOPY W/  POLYPECTOMY  03/17/11   diminutive polyp  . LOOP RECORDER EXPLANT N/A 01/14/2014   Procedure: LOOP RECORDER EXPLANT;  Surgeon: Sanda Klein, MD;  Location: [facility] CATH LAB;  Service: Cardiovascular;  Laterality: N/A;  . LOOP RECORDER IMPLANT  04/06/2012   Reveal XT [id number]  . LOOP RECORDER IMPLANT N/A 04/06/2012   Procedure: LOOP RECORDER IMPLANT;  Surgeon: Sanda Klein, MD;  Location: [facility] CATH LAB;  Service: Cardiovascular;  Laterality: N/A;  . NM MYOCAR PERF WALL MOTION  01/25/2008   mild anteroapical wall ischemia  . PATENT DUCTUS ARTERIOUS REPAIR     at age 51       Home Medications    Prior to Admission medications   Medication Sig Start Date End Date Taking? Authorizing Provider  albuterol (PROVENTIL) (2.5 MG/3ML) 0.083% nebulizer solution USE 1 VIAL VIA NEBULIZER  EVERY 6 HOURS AS NEEDED FOR WHEEZING OR SHORTNESS OF  BREATH 11/23/16  Yes [name], NP  aspirin 325 MG tablet Take 325 mg by mouth daily.   Yes Historical Provider, MD  benzonatate (TESSALON) 100 MG capsule 1-2 tabs po every 8 as needed for cough 11/22/16  Yes Edward Saguier, PA-C  cetirizine (ZYRTEC) 10 MG tablet Take 10 mg by mouth daily.   Yes Historical Provider, MD  clonazePAM (KLONOPIN) 1 MG tablet Take 1 tablet (1 mg total) by mouth daily. 10/20/16  Yes [name], NP  diclofenac sodium (VOLTAREN) 1 % GEL Apply 2 g topically 4 (four) times daily. Patient taking differently: Apply 2 g topically 4 (four) times daily as needed (pain).  09/26/16  Yes [name], NP  ezetimibe (ZETIA) 10 MG tablet Take 1 tablet (10 mg total) by mouth daily. 11/15/16  Yes [name], NP  fenofibrate (TRICOR) 145 MG tablet Take 1 tablet by mouth  daily 08/10/15  Yes [name], NP  fish oil-omega-3 fatty acids 1000 MG capsule Take 2 g by mouth daily.    Yes Historical Provider, MD  Fluticasone-Salmeterol (ADVAIR) 100-50 MCG/DOSE AEPB Inhale 1 puff into the lungs 2 (two) times daily. 11/22/16  Yes Edward Saguier,  PA-C  gabapentin (NEURONTIN) 300 MG capsule TAKE 1 CAPSULE BY MOUTH AT  6AM, 11:30AM AND 4:30PM AND TAKE 3 CAPSULES AT 10:00PM 11/23/16  Yes [name], NP  isosorbide mononitrate (IMDUR) 30 MG 24 hr tablet Take 1 tablet (30 mg total) by mouth daily. 10/26/16  Yes Jeffrey Hedges, PA-C  ketoconazole (NIZORAL) 2 % shampoo APPLY TOPICALLY DAILY 10/18/16  Yes [name], NP  lansoprazole (PREVACID) 15 MG capsule Take 15 mg by mouth daily.   Yes Historical Provider, MD  lisinopril (PRINIVIL,ZESTRIL) 10 MG tablet Take 1 tablet (10 mg total) by mouth daily. 11/15/16  Yes [name], NP  loperamide (IMODIUM) 2 MG capsule Take 1 capsule (2 mg total) by mouth as needed for diarrhea or loose stools. 01/20/16  Yes Merryl Hacker, MD  meloxicam (MOBIC) 7.5 MG tablet TAKE 1 TABLET BY MOUTH  DAILY 11/18/16  Yes [name], NP  metFORMIN (GLUCOPHAGE-XR) 500 MG 24 hr tablet TAKE 2 TABLETS BY MOUTH  DAILY WITH BREAKFAST 11/23/16  Yes  [name], NP  metoprolol (LOPRESSOR) 50 MG tablet Take 1 tablet (50 mg total) by mouth once as directed. 11/04/16  Yes Mihai Croitoru, MD  montelukast (SINGULAIR) 10 MG tablet TAKE 1 TABLET BY MOUTH AT  BEDTIME 11/23/16  Yes [name], NP  Multiple Vitamin (MULTIVITAMIN WITH MINERALS) TABS tablet Take 1 tablet by mouth daily.   Yes Historical Provider, MD  naproxen sodium (ALEVE) 220 MG tablet Take 440 mg by mouth 2 (two) times daily. Take 440 mg in the morning and 220 mg in the evening.   Yes Historical Provider, MD  nitroGLYCERIN (NITROSTAT) 0.4 MG SL tablet Place 1 tablet (0.4 mg total) under the tongue every 5 (five) minutes as needed for chest pain. 11/11/15  Yes [name], NP  ondansetron (ZOFRAN ODT) 4 MG disintegrating tablet Take 1 tablet (4 mg total) by mouth every 8 (eight) hours as needed for nausea or vomiting. 01/20/16  Yes Merryl Hacker, MD  ONETOUCH VERIO test strip CHECK BLOOD SUGAR 3 TIMES  DAILY 10/18/16  Yes [name], NP  ramelteon (ROZEREM) 8 MG tablet Take 1 tablet (8 mg total) by mouth at bedtime. 10/15/15  Yes [name], NP  rosuvastatin (CRESTOR) 40 MG tablet Take 1 tablet (40 mg total) by mouth daily. 06/08/16  Yes [name], NP  sertraline (ZOLOFT) 50 MG tablet TAKE 1 TABLET BY MOUTH  DAILY 10/18/16  Yes [name], NP  tamsulosin (FLOMAX) 0.4 MG CAPS capsule TAKE 1 CAPSULE BY MOUTH  DAILY 10/18/16  Yes [name], NP  tiZANidine (ZANAFLEX) 2 MG tablet TAKE 1 TABLET BY MOUTH  EVERY 6 HOURS AS NEEDED FOR MUSCLE SPASM(S) 11/22/16  Yes Charlett Blake, MD  traMADol (ULTRAM) 50 MG tablet Take 1 tablet (50 mg total) by mouth every 6 (six) hours as needed for severe pain. 10/17/16  Yes Charlett Blake, MD  traZODone (DESYREL) 100 MG tablet TAKE 1 TABLET BY MOUTH AT  BEDTIME 11/23/16  Yes [name], NP  zolpidem (AMBIEN) 10 MG tablet Take 1 tablet (10 mg total) by mouth at bedtime as needed for sleep. 10/20/16  Yes [name], NP    Family History Family History  Problem Relation Age of Onset  . Colon cancer Paternal Grandfather   . Prostate cancer Paternal Grandfather   . Aneurysm Father   . Heart attack Father 47  . Coronary artery disease Father   . Colon polyps Mother   . Heart disease Mother   . Coronary artery disease Mother   . Migraines Mother   . Breast cancer Maternal Grandmother   . Colon cancer Paternal Uncle     x 2  . Stomach cancer Brother   . Liver disease      unsure who it was  . Allergies Daughter     Social History Social History  Substance Use Topics  . Smoking status: Never Smoker  . Smokeless tobacco: Never Used  . Alcohol use No     Allergies   Benadryl [diphenhydramine hcl] and Red blood cells   Review of Systems Review of Systems  Constitutional: Negative for diaphoresis.  Respiratory: Positive for shortness of breath.   Cardiovascular: Positive for chest pain.  Musculoskeletal: Positive for neck  pain.  Neurological: Positive for dizziness and weakness. Negative for numbness.  All other systems reviewed and are negative.    Physical Exam Updated Vital Signs BP 97/69   Pulse 66   Temp 98.4 F (36.9 C) (Oral)   Resp 12  Ht 5\' 8"  (1.727 m)   Wt 187 lb (84.8 kg)   SpO2 97%   BMI 28.43 kg/m   Physical Exam  Constitutional: He is oriented to person, place, and time. He appears well-developed and well-nourished.  HENT:  Head: Normocephalic and atraumatic.  Eyes: Pupils are equal, round, and reactive to light.  Neck: Normal range of motion. Neck supple.  Cardiovascular: Normal rate, regular rhythm and normal heart sounds.   No murmur heard. Pulmonary/Chest: Effort normal and breath sounds normal. No respiratory distress. He has no wheezes. He exhibits no tenderness.  Abdominal: Soft. Bowel sounds are normal. There is no tenderness. There is no rebound.  Musculoskeletal: He exhibits no edema.  Neurological: He is alert and oriented to person, place, and time.  No drift, no dysmetria to finger-nose-finger, 5 out of 5 strength in all 4 extremities  Skin: Skin is warm and dry.  Psychiatric: He has a normal mood and affect.  Nursing note and vitals reviewed.    ED Treatments / Results  DIAGNOSTIC STUDIES: Oxygen Saturation is 98% on RA, normal by my interpretation.  COORDINATION OF CARE:  2:18 AM Discussed treatment plan with pt at bedside and pt agreed to plan.  Labs (all labs ordered are listed, but only abnormal results are displayed) Labs Reviewed  BASIC METABOLIC PANEL - Abnormal; Notable for the following:       Result Value   Glucose, Bld 105 (*)    All other components within normal limits  CBC - Abnormal; Notable for the following:    HCT 38.4 (*)    RDW 16.0 (*)    All other components within normal limits  I-STAT TROPOININ, ED  I-STAT TROPOININ, ED    EKG  EKG Interpretation  Date/Time:  Wednesday December 21 2016 01:56:05 EDT Ventricular Rate:   80 PR Interval:    QRS Duration: 105 QT Interval:  369 QTC Calculation: 426 R Axis:   103 Text Interpretation:  Sinus rhythm S1,S2,S3 pattern Consider right ventricular hypertrophy No significant change since last tracing Confirmed by Dina Rich  MD, Ellianne Gowen ([id number]) on 12/21/2016 3:13:18 AM       Radiology Dg Chest 2 View  Result Date: 12/21/2016 CLINICAL DATA:  Chest pain and dyspnea tonight. EXAM: CHEST  2 VIEW COMPARISON:  11/22/2016 FINDINGS: The lungs are clear. The pulmonary vasculature is normal. Heart size is normal. Hilar and mediastinal contours are unremarkable. There is no pleural effusion. IMPRESSION: No active cardiopulmonary disease. Electronically Signed   By: Andreas Newport M.D.   On: 12/21/2016 03:21   Ct Angio Chest Aorta W And/or Wo Contrast  Result Date: 12/21/2016 CLINICAL DATA:  Chest pain and lightheadedness today. EXAM: CT ANGIOGRAPHY CHEST WITH CONTRAST TECHNIQUE: Multidetector CT imaging of the chest was performed using the standard protocol during bolus administration of intravenous contrast. Multiplanar CT image reconstructions and MIPs were obtained to evaluate the vascular anatomy. CONTRAST:  100 mL Isovue 370 intravenous COMPARISON:  11/09/2016 FINDINGS: Cardiovascular: Satisfactory opacification of the pulmonary arteries to the segmental level. No evidence of pulmonary embolism. Normal heart size. No pericardial effusion. Mediastinum/Nodes: No enlarged mediastinal, hilar, or axillary lymph nodes. Thyroid gland, trachea, and esophagus demonstrate no significant findings. Lungs/Pleura: Lungs are clear. No pleural effusion or pneumothorax. Upper Abdomen: No acute abnormality. Musculoskeletal: No chest wall abnormality. No acute or significant osseous findings. Review of the MIP images confirms the above findings. IMPRESSION: Negative for acute pulmonary embolism.  No significant abnormality. Electronically Signed   By: Andreas Newport  M.D.   On: 12/21/2016 05:03     Procedures Procedures (including critical care time)  Medications Ordered in ED Medications  diazepam (VALIUM) tablet 5 mg (5 mg Oral Not Given 12/21/16 0340)  morphine 4 MG/ML injection 4 mg (4 mg Intravenous Given 12/21/16 0340)  ondansetron (ZOFRAN) injection 4 mg (4 mg Intravenous Given 12/21/16 0340)  sodium chloride 0.9 % bolus 1,000 mL (0 mLs Intravenous Stopped 12/21/16 0551)  iopamidol (ISOVUE-370) 76 % injection (100 mLs  Contrast Given 12/21/16 0415)     Initial Impression / Assessment and Plan / ED Course  I have reviewed the triage vital signs and the nursing notes.  Pertinent labs & imaging results that were available during my care of the patient were reviewed by me and considered in my medical decision making (see chart for details).     Patient presents with dizziness and chest pain. He has had both in the past. History of vertigo. Orthostatics with increase in heart rate upon standing. Patient given fluids. Initial EKG and troponin negative. Chest x-ray reassuring. He does have a 33 mm aortic dilation. Will obtain CT chest to evaluate. Patient was given Valium, morphine, and Zofran. On recheck, he is resting comfortably. Complete resolution of symptoms. CT is reassuring. Repeat troponin is negative. Chest pain is very atypical for ACS. He has a cardiologist. Recommend close follow-up with cardiology. Dizziness is also improved with hydration and Valium.  After history, exam, and medical workup I feel the patient has been appropriately medically screened and is safe for discharge home. Pertinent diagnoses were discussed with the patient. Patient was given return precautions.   Final Clinical Impressions(s) / ED Diagnoses   Final diagnoses:  Dizziness  Atypical chest pain   I personally performed the services described in this documentation, which was scribed in my presence. The recorded information has been reviewed and is accurate.  New Prescriptions New Prescriptions    No medications on file     Merryl Hacker, MD 12/21/16 [phone]

## 2016-12-21 NOTE — ED Notes (Signed)
Patient transported to CT 

## 2016-12-26 ENCOUNTER — Ambulatory Visit: Payer: Self-pay | Admitting: Physical Medicine & Rehabilitation

## 2017-01-09 ENCOUNTER — Ambulatory Visit: Payer: Self-pay | Admitting: Physical Medicine & Rehabilitation

## 2017-01-09 ENCOUNTER — Ambulatory Visit: Payer: Self-pay | Admitting: Cardiovascular Disease

## 2017-01-16 ENCOUNTER — Ambulatory Visit: Payer: Self-pay | Admitting: Physical Medicine & Rehabilitation

## 2017-01-16 ENCOUNTER — Ambulatory Visit (INDEPENDENT_AMBULATORY_CARE_PROVIDER_SITE_OTHER): Payer: Medicare Other | Admitting: Family

## 2017-01-16 ENCOUNTER — Encounter: Payer: Self-pay | Admitting: Family

## 2017-01-16 ENCOUNTER — Encounter: Payer: Medicare Other | Attending: Physical Medicine & Rehabilitation

## 2017-01-16 ENCOUNTER — Ambulatory Visit (HOSPITAL_BASED_OUTPATIENT_CLINIC_OR_DEPARTMENT_OTHER): Payer: Medicare Other | Admitting: Physical Medicine & Rehabilitation

## 2017-01-16 VITALS — BP 108/63 | HR 85 | Temp 98.2°F | Resp 16 | Ht 68.0 in | Wt 190.0 lb

## 2017-01-16 VITALS — BP 113/79 | HR 96

## 2017-01-16 DIAGNOSIS — M797 Fibromyalgia: Secondary | ICD-10-CM | POA: Diagnosis not present

## 2017-01-16 DIAGNOSIS — M542 Cervicalgia: Secondary | ICD-10-CM | POA: Diagnosis present

## 2017-01-16 DIAGNOSIS — Z800 Family history of malignant neoplasm of digestive organs: Secondary | ICD-10-CM | POA: Insufficient documentation

## 2017-01-16 DIAGNOSIS — Z8249 Family history of ischemic heart disease and other diseases of the circulatory system: Secondary | ICD-10-CM | POA: Insufficient documentation

## 2017-01-16 DIAGNOSIS — M47812 Spondylosis without myelopathy or radiculopathy, cervical region: Secondary | ICD-10-CM | POA: Insufficient documentation

## 2017-01-16 DIAGNOSIS — F32A Depression, unspecified: Secondary | ICD-10-CM

## 2017-01-16 DIAGNOSIS — I10 Essential (primary) hypertension: Secondary | ICD-10-CM

## 2017-01-16 DIAGNOSIS — M79671 Pain in right foot: Secondary | ICD-10-CM

## 2017-01-16 DIAGNOSIS — E785 Hyperlipidemia, unspecified: Secondary | ICD-10-CM | POA: Diagnosis not present

## 2017-01-16 DIAGNOSIS — E119 Type 2 diabetes mellitus without complications: Secondary | ICD-10-CM | POA: Insufficient documentation

## 2017-01-16 DIAGNOSIS — F419 Anxiety disorder, unspecified: Secondary | ICD-10-CM

## 2017-01-16 DIAGNOSIS — Z803 Family history of malignant neoplasm of breast: Secondary | ICD-10-CM | POA: Insufficient documentation

## 2017-01-16 DIAGNOSIS — M7061 Trochanteric bursitis, right hip: Secondary | ICD-10-CM | POA: Diagnosis not present

## 2017-01-16 DIAGNOSIS — F329 Major depressive disorder, single episode, unspecified: Secondary | ICD-10-CM

## 2017-01-16 DIAGNOSIS — G5762 Lesion of plantar nerve, left lower limb: Secondary | ICD-10-CM

## 2017-01-16 DIAGNOSIS — M545 Low back pain, unspecified: Secondary | ICD-10-CM

## 2017-01-16 DIAGNOSIS — K219 Gastro-esophageal reflux disease without esophagitis: Secondary | ICD-10-CM | POA: Diagnosis not present

## 2017-01-16 DIAGNOSIS — G4700 Insomnia, unspecified: Secondary | ICD-10-CM | POA: Diagnosis not present

## 2017-01-16 DIAGNOSIS — J45909 Unspecified asthma, uncomplicated: Secondary | ICD-10-CM | POA: Diagnosis not present

## 2017-01-16 DIAGNOSIS — I2510 Atherosclerotic heart disease of native coronary artery without angina pectoris: Secondary | ICD-10-CM | POA: Insufficient documentation

## 2017-01-16 LAB — POCT URINALYSIS DIPSTICK
Bilirubin, UA: NEGATIVE
Glucose, UA: NEGATIVE
KETONES UA: NEGATIVE
Leukocytes, UA: NEGATIVE
Nitrite, UA: NEGATIVE
PROTEIN UA: NEGATIVE
RBC UA: NEGATIVE
Urobilinogen, UA: 0.2 E.U./dL
pH, UA: 6 (ref 5.0–8.0)

## 2017-01-16 MED ORDER — TRAMADOL HCL 50 MG PO TABS: 50.0000 mg | ORAL_TABLET | Freq: Four times a day (QID) | ORAL | 5 refills | Status: DC | PRN

## 2017-01-16 MED ORDER — BUDESONIDE-FORMOTEROL FUMARATE 160-4.5 MCG/ACT IN AERO: 2.0000 | INHALATION_SPRAY | Freq: Two times a day (BID) | RESPIRATORY_TRACT | 3 refills | Status: DC

## 2017-01-16 MED ORDER — ATORVASTATIN CALCIUM 80 MG PO TABS: 80.0000 mg | ORAL_TABLET | Freq: Every day | ORAL | 3 refills | Status: DC

## 2017-01-16 MED ORDER — PREDNISONE 10 MG PO TABS: ORAL_TABLET | ORAL | 0 refills | Status: DC

## 2017-01-16 NOTE — Progress Notes (Signed)
Subjective:    Patient ID: Cody Raymond, male    DOB: Jul 22, 1971, 46 y.o.   MRN: [id number]  HPI  Cody Raymond is a 46 yr old male who presents today for follow up.  1) asthma- not currently taking advair due to cost.  He is maintained on singulair. Using flonase which is helping with his allergic rhinitis.  He is using his albuterol inhaler 6-7 times a day.   2) DM2-reports sugars 110-150.  Lab Results  Component Value Date   HGBA1C 5.4 06/08/2016   HGBA1C 6.4 09/30/2015   HGBA1C 5.8 04/17/2015   Lab Results  Component Value Date   MICROALBUR 1.15 11/20/2013   LDLCALC 122 (H) 04/17/2015   CREATININE 0.86 12/21/2016   3) HTN- on lisinopril and metoprolol  BP Readings from Last 3 Encounters:  01/16/17 108/63  01/16/17 113/79  12/21/16 96/70   4) Anxiety/depression-  Notes that he has had increased stress with her older daughter.    5) Dysuria- notes 3 weeks of intermittent dysuria and low back pain.    6) insomnia- maintained on ambien. Poor sleep due to stress. Daughter is in an abuseive relationship that she will not leave.    Wt Readings from Last 3 Encounters:  01/16/17 190 lb (86.2 kg)  12/21/16 187 lb (84.8 kg)  11/22/16 187 lb 8 oz (85 kg)   Hyperlipidemia- reports that crestor became too expensive  Lab Results  Component Value Date   CHOL 186 06/08/2016   HDL 36.40 (L) 06/08/2016   LDLCALC 122 (H) 04/17/2015   LDLDIRECT 102.0 06/08/2016   TRIG 211.0 (H) 06/08/2016   CHOLHDL 5 06/08/2016    Review of Systems See hpi  Past Medical History:  Diagnosis Date  . Aneurysm ([place])   . Asthma   . Blood transfusion   . CAD (coronary artery disease), non obstructive on cath 2011 04/05/2012  . Coronary artery disease   . Diabetes mellitus   . Enlarged prostate   . GERD (gastroesophageal reflux disease)   . H/O syncope   . Heart disease   . History of repair of patent ductus arteriosus 07/23/2016  . Hyperlipidemia   . Hypertension   . Neuromuscular  disorder ([facility])    DJD  . Refusal of blood transfusions as patient is Jehovah's Witness      Social History   Social History  . Marital status: Married    Spouse name: N/A  . Number of children: N/A  . Years of education: N/A   Occupational History  . Not on file.   Social History Main Topics  . Smoking status: Never Smoker  . Smokeless tobacco: Never Used  . Alcohol use No  . Drug use: No  . Sexual activity: Not on file   Other Topics Concern  . Not on file   Social History Narrative   Holter monitor 08/2010: PVCs and sinus tachy.   Sleep Study (02/2008): mild sleep apnea, no indication for CPAP.    Past Surgical History:  Procedure Laterality Date  . APPENDECTOMY    . BACK SURGERY    . CARDIAC CATHETERIZATION  2007   Clean Cardiac Cath ([place]), with RCA 30% narrowing likely due to catheter induced spasm in 09/02/10.  Marland Kitchen CARDIAC CATHETERIZATION  09/02/2010   mod. nonobstructive disease in the RCA and CX, tortuous LAD  . COLONOSCOPY W/ POLYPECTOMY  03/17/11   diminutive polyp  . LOOP RECORDER EXPLANT N/A 01/14/2014   Procedure: LOOP RECORDER EXPLANT;  Surgeon:  Sanda Klein, MD;  Location: [facility];  Service: Cardiovascular;  Laterality: N/A;  . LOOP RECORDER IMPLANT  04/06/2012   Reveal XT [id number]  . LOOP RECORDER IMPLANT N/A 04/06/2012   Procedure: LOOP RECORDER IMPLANT;  Surgeon: Sanda Klein, MD;  Location: [facility];  Service: Cardiovascular;  Laterality: N/A;  . NM MYOCAR PERF WALL MOTION  01/25/2008   mild anteroapical wall ischemia  . PATENT DUCTUS ARTERIOUS REPAIR     at age 66    Family History  Problem Relation Age of Onset  . Colon cancer Paternal Grandfather   . Prostate cancer Paternal Grandfather   . Aneurysm Father   . Heart attack Father 54  . Coronary artery disease Father   . Colon polyps Mother   . Heart disease Mother   . Coronary artery disease Mother   . Migraines Mother   . Breast cancer Maternal Grandmother   . Colon  cancer Paternal Uncle     x 2  . Stomach cancer Brother   . Liver disease      unsure who it was  . Allergies Daughter     Allergies  Allergen Reactions  . Benadryl [Diphenhydramine Hcl]     "drives me nuts"  . Red Blood Cells     PT. REFUSES ANY BLOOD PRODUCTS - JEHOVAH'S WITNESS.    Current Outpatient Prescriptions on File Prior to Visit  Medication Sig Dispense Refill  . albuterol (PROVENTIL) (2.5 MG/3ML) 0.083% nebulizer solution USE 1 VIAL VIA NEBULIZER  EVERY 6 HOURS AS NEEDED FOR WHEEZING OR SHORTNESS OF  BREATH 150 mL 1  . aspirin 325 MG tablet Take 325 mg by mouth daily.    . cetirizine (ZYRTEC) 10 MG tablet Take 10 mg by mouth daily.    . clonazePAM (KLONOPIN) 1 MG tablet Take 1 tablet (1 mg total) by mouth daily. 90 tablet 0  . diclofenac sodium (VOLTAREN) 1 % GEL Apply 2 g topically 4 (four) times daily. (Patient taking differently: Apply 2 g topically 4 (four) times daily as needed (pain). ) 720 g 0  . ezetimibe (ZETIA) 10 MG tablet Take 1 tablet (10 mg total) by mouth daily. 90 tablet 1  . fenofibrate (TRICOR) 145 MG tablet Take 1 tablet by mouth  daily 90 tablet 1  . fish oil-omega-3 fatty acids 1000 MG capsule Take 2 g by mouth daily.     . Fluticasone-Salmeterol (ADVAIR) 100-50 MCG/DOSE AEPB Inhale 1 puff into the lungs 2 (two) times daily. 1 each 3  . gabapentin (NEURONTIN) 300 MG capsule TAKE 1 CAPSULE BY MOUTH AT  6AM, 11:30AM AND 4:30PM AND TAKE 3 CAPSULES AT 10:00PM 540 capsule 1  . isosorbide mononitrate (IMDUR) 30 MG 24 hr tablet Take 1 tablet (30 mg total) by mouth daily. 30 tablet 0  . ketoconazole (NIZORAL) 2 % shampoo APPLY TOPICALLY DAILY 360 mL 1  . lansoprazole (PREVACID) 15 MG capsule Take 15 mg by mouth daily.    Marland Kitchen lisinopril (PRINIVIL,ZESTRIL) 10 MG tablet Take 1 tablet (10 mg total) by mouth daily. 90 tablet 1  . loperamide (IMODIUM) 2 MG capsule Take 1 capsule (2 mg total) by mouth as needed for diarrhea or loose stools. 12 capsule 0  . meloxicam  (MOBIC) 7.5 MG tablet TAKE 1 TABLET BY MOUTH  DAILY 42 tablet 0  . metFORMIN (GLUCOPHAGE-XR) 500 MG 24 hr tablet TAKE 2 TABLETS BY MOUTH  DAILY WITH BREAKFAST 180 tablet 1  . metoprolol (LOPRESSOR) 50 MG tablet Take  1 tablet (50 mg total) by mouth once as directed. 1 tablet 0  . montelukast (SINGULAIR) 10 MG tablet TAKE 1 TABLET BY MOUTH AT  BEDTIME 90 tablet 1  . Multiple Vitamin (MULTIVITAMIN WITH MINERALS) TABS tablet Take 1 tablet by mouth daily.    . naproxen sodium (ALEVE) 220 MG tablet Take 440 mg by mouth 2 (two) times daily. Take 440 mg in the morning and 220 mg in the evening.    . nitroGLYCERIN (NITROSTAT) 0.4 MG SL tablet Place 1 tablet (0.4 mg total) under the tongue every 5 (five) minutes as needed for chest pain. 30 tablet 1  . ondansetron (ZOFRAN ODT) 4 MG disintegrating tablet Take 1 tablet (4 mg total) by mouth every 8 (eight) hours as needed for nausea or vomiting. 20 tablet 0  . ONETOUCH VERIO test strip CHECK BLOOD SUGAR 3 TIMES  DAILY 300 each 1  . ramelteon (ROZEREM) 8 MG tablet Take 1 tablet (8 mg total) by mouth at bedtime. 30 tablet 5  . rosuvastatin (CRESTOR) 40 MG tablet Take 1 tablet (40 mg total) by mouth daily. 30 tablet 5  . sertraline (ZOLOFT) 50 MG tablet TAKE 1 TABLET BY MOUTH  DAILY 90 tablet 1  . tamsulosin (FLOMAX) 0.4 MG CAPS capsule TAKE 1 CAPSULE BY MOUTH  DAILY 90 capsule 1  . tiZANidine (ZANAFLEX) 2 MG tablet TAKE 1 TABLET BY MOUTH  EVERY 6 HOURS AS NEEDED FOR MUSCLE SPASM(S) 270 tablet 2  . traZODone (DESYREL) 100 MG tablet TAKE 1 TABLET BY MOUTH AT  BEDTIME 90 tablet 1  . zolpidem (AMBIEN) 10 MG tablet Take 1 tablet (10 mg total) by mouth at bedtime as needed for sleep. 90 tablet 0  . [DISCONTINUED] niacin (NIASPAN) 1000 MG CR tablet Take 1 tablet (1,000 mg total) by mouth at bedtime. 30 tablet 6   No current facility-administered medications on file prior to visit.     BP 108/63 (BP Location: Right Arm, Cuff Size: Normal)   Pulse 85   Temp 98.2 F  (36.8 C) (Oral)   Resp 16   Ht 5\' 8"  (1.727 m)   Wt 190 lb (86.2 kg)   SpO2 99% Comment: room air  BMI 28.89 kg/m       Objective:   Physical Exam  Constitutional: He is oriented to person, place, and time. He appears well-developed and well-nourished. No distress.  HENT:  Head: Normocephalic and atraumatic.  Cardiovascular: Normal rate and regular rhythm.   No murmur heard. Pulmonary/Chest: Effort normal. No respiratory distress. He has wheezes. He has no rales.  Musculoskeletal: He exhibits no edema.  Neurological: He is alert and oriented to person, place, and time.  Skin: Skin is warm and dry.  Psychiatric: He has a normal mood and affect. His behavior is normal. Thought content normal.          Assessment & Plan:  Low back pain- likely musculoskeletal.   UA is clear- will send for culture to be certain he does not have infection.   Asthma- uncontrolled. Trial of pred taper. Will rx symbicort to see if this is more affordable for him. Continue singulair and prn albuterol. Advised pt to monitor sugars while on prednisone and call if sugar >300.   DM2- stable, will obtain A1C.  HTN- bp stable on current meds.  Hyperlipidemia- could not afford crestor 40mg . . He is taking zetia and fenofibrate- continue. Add lipitor 80mg .   Anxiety/depression-  Fair control, recent stress with is oldest daughter  is weighing on him. I have encouraged him to establish with a counselor.    Insomnia- fair. Despite trazodone and ambien. Stress is playing a role. I hope that he will benefit from counseling.

## 2017-01-16 NOTE — Patient Instructions (Addendum)
Begin lipitor and symbicort (call if too expensive).   Please call [facility] and schedule an appointment with Clint Bolder (counselor) [phone]

## 2017-01-16 NOTE — Progress Notes (Signed)
Pre visit review using our clinic review tool, if applicable. No additional management support is needed unless otherwise documented below in the visit note. 

## 2017-01-16 NOTE — Patient Instructions (Signed)
We discussed a longer acting steroid called Zilretta that can be used for some spinal injection  Please call podiatrist for your foot pain. My impression that you may have pain in the right first metatarsal phalangeal joint that may respond to a foot orthosis. Also in the left foot between the second and third metatarsals. You may have a Morton's neuroma.

## 2017-01-16 NOTE — Progress Notes (Signed)
Bilateral foot pain. Patient states that this has been going on for a few weeks. He notes pain in the right great toe but in addition, he noticed pain that is in the left second toe. He denies any falls or trauma. He is a diabetic and does have a history of neuropathy. He states that the pain occurs at different times, sometimes with ambulation. Sometimes it occurs at rest. He has not noted any swelling or any skin discoloration in his feet. He has had no open lesions.  Currently not wearing any type of foot orthosis, but has had foot orthoses in the past.  Past medical history significant for Achilles tendinitis. Has been seen by podiatry in the past, [place].  Objective  Patient ambulates with cane. No evidence of toe drag or knee instability. Skin no evidence of lesions, no evidence of rashes in both feet. EXT No evidence of swelling in the lower extremities. No tenderness to light touch in both feet. No evidence of joint swelling in the MTPs. There is pain with passive dorsiflexion greater than plantar flexion at the right first MTP. There is no pain at PIP with range of motion. There is pain to palpation between the left second and third MTPs.  Impression 1. Bilateral foot pain Right side appears to be consistent with right MTP joint pain. 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 Left foot Morton's neuroma between MTP 2 and 3  I do not think his current pains or related to his peripheral neuropathy.  Recommend podiatry revealed valuation may need injection for the Morton's neuroma.

## 2017-01-16 NOTE — Progress Notes (Signed)
Right Trochanteric bursa injection With ultrasound guidance  Indication Trochanteric bursitis. Exam has tenderness over the greater trochanter of the hip. Pain has not responded to conservative care such as exercise therapy and oral medications. Pain interferes with sleep or with mobility Informed consent was obtained after describing risks and benefits of the procedure with the patient these include bleeding bruising and infection. Patient has signed written consent form. Patient placed in a lateral decubitus position with the affected hip superior. Point of maximal pain was palpated marked and prepped with Betadine and entered with a needle to bone contact. Needle slightly withdrawn then 6mg of betamethasone with 4 cc 1% lidocaine were injected. Patient tolerated procedure well. Post procedure instructions given.  

## 2017-01-17 LAB — HEMOGLOBIN A1C: HEMOGLOBIN A1C: 5.8 % (ref 4.6–6.5)

## 2017-01-18 NOTE — Addendum Note (Signed)
Addended by: Harl Bowie on: 01/18/2017 02:06 PM   Modules accepted: Orders

## 2017-01-19 LAB — URINE CULTURE

## 2017-01-20 ENCOUNTER — Encounter: Payer: Self-pay | Admitting: Family

## 2017-01-30 ENCOUNTER — Other Ambulatory Visit: Payer: Self-pay | Admitting: Family

## 2017-01-31 NOTE — Telephone Encounter (Signed)
Faxed refill request received from [facility] for Clonazepam 1 mg & Zolpidem 10 mg tabs  [Albuterol Nebulizer Solution request sent 01/31/17] Last filled by MD on 10/20/16, #90x0 Last AEX - 01/16/17 Next AEX - 3-Mths. Last UDS - 10/21/16, moderate Please Advise on refills/SLS 05/15

## 2017-02-01 MED ORDER — ZOLPIDEM TARTRATE 10 MG PO TABS: 10.0000 mg | ORAL_TABLET | Freq: Every evening | ORAL | 0 refills | Status: DC | PRN

## 2017-02-01 MED ORDER — CLONAZEPAM 1 MG PO TABS: 1.0000 mg | ORAL_TABLET | Freq: Every day | ORAL | 0 refills | Status: DC

## 2017-02-01 NOTE — Telephone Encounter (Signed)
Rx[s] have been printed to be signed by provider on 02/03/07 for faxing/SLS 05/16

## 2017-02-01 NOTE — Telephone Encounter (Signed)
Please print refills and I will sign tomorrow when I am back in the office.

## 2017-02-02 ENCOUNTER — Other Ambulatory Visit: Payer: Self-pay | Admitting: Family

## 2017-02-15 ENCOUNTER — Other Ambulatory Visit: Payer: Self-pay | Admitting: Family

## 2017-02-15 NOTE — Telephone Encounter (Signed)
Last meloxicam rx 11/18/16, #42.  Please advise?

## 2017-03-01 ENCOUNTER — Ambulatory Visit (INDEPENDENT_AMBULATORY_CARE_PROVIDER_SITE_OTHER): Payer: Medicare Other | Admitting: Adult Health

## 2017-03-01 DIAGNOSIS — J40 Bronchitis, not specified as acute or chronic: Secondary | ICD-10-CM

## 2017-03-01 MED ORDER — PREDNISONE 10 MG PO TABS: ORAL_TABLET | ORAL | 0 refills | Status: DC

## 2017-03-01 MED ORDER — BENZONATATE 100 MG PO CAPS: 100.0000 mg | ORAL_CAPSULE | Freq: Two times a day (BID) | ORAL | 0 refills | Status: DC | PRN

## 2017-03-01 MED ORDER — AZITHROMYCIN 250 MG PO TABS: ORAL_TABLET | ORAL | 0 refills | Status: DC

## 2017-03-01 NOTE — Progress Notes (Signed)
Subjective:    Patient ID: Cody Raymond, male    DOB: May 05, 1971, 46 y.o.   MRN: [id number]  HPI  46 year old male who  has a past medical history of Aneurysm ([place]); Asthma; Blood transfusion; CAD (coronary artery disease), non obstructive on cath 2011 (04/05/2012); Coronary artery disease; Diabetes mellitus; Enlarged prostate; GERD (gastroesophageal reflux disease); H/O syncope; Heart disease; History of repair of patent ductus arteriosus (07/23/2016); Hyperlipidemia; Hypertension; Neuromuscular disorder ([place]); and Refusal of blood transfusions as patient is Jehovah's Witness. He is a patient of [name] who I am seeing today for an acute issue. He reports history of bronchitis and usually has a flare about this time of year. His symptoms include wheezing, chest congestion, and a semi productive cough. He denies any sinus pain or pressure and has not noticed any fevers. His symptoms have been present for about one week. He is using inhalers as prescribed but does not feel as though his symptoms are improving.   Review of Systems See HPI   Past Medical History:  Diagnosis Date  . Aneurysm ([place])   . Asthma   . Blood transfusion   . CAD (coronary artery disease), non obstructive on cath 2011 04/05/2012  . Coronary artery disease   . Diabetes mellitus   . Enlarged prostate   . GERD (gastroesophageal reflux disease)   . H/O syncope   . Heart disease   . History of repair of patent ductus arteriosus 07/23/2016  . Hyperlipidemia   . Hypertension   . 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 Refusal of blood transfusions as patient is Jehovah's Witness     Social History   Social History  . Marital status: Married    Spouse name: N/A  . Number of children: N/A  . Years of education: N/A   Occupational History  . Not on file.   Social History Main Topics  . Smoking status: Never Smoker  . Smokeless tobacco: Never Used  . Alcohol use No  . Drug use: No  . Sexual  activity: Not on file   Other Topics Concern  . Not on file   Social History Narrative   Holter monitor 08/2010: PVCs and sinus tachy.   Sleep Study (02/2008): mild sleep apnea, no indication for CPAP.    Past Surgical History:  Procedure Laterality Date  . APPENDECTOMY    . BACK SURGERY    . CARDIAC CATHETERIZATION  2007   Clean Cardiac Cath ([place]), with RCA 30% narrowing likely due to catheter induced spasm in 09/02/10.  Marland Kitchen CARDIAC CATHETERIZATION  09/02/2010   mod. nonobstructive disease in the RCA and CX, tortuous LAD  . COLONOSCOPY W/ POLYPECTOMY  03/17/11   diminutive polyp  . LOOP RECORDER EXPLANT N/A 01/14/2014   Procedure: LOOP RECORDER EXPLANT;  Surgeon: Sanda Klein, MD;  Location: [facility];  Service: Cardiovascular;  Laterality: N/A;  . LOOP RECORDER IMPLANT  04/06/2012   Reveal XT [id number]  . LOOP RECORDER IMPLANT N/A 04/06/2012   Procedure: LOOP RECORDER IMPLANT;  Surgeon: Sanda Klein, MD;  Location: [facility];  Service: Cardiovascular;  Laterality: N/A;  . NM MYOCAR PERF WALL MOTION  01/25/2008   mild anteroapical wall ischemia  . PATENT DUCTUS ARTERIOUS REPAIR     at age 72    Family History  Problem Relation Age of Onset  . Colon cancer Paternal Grandfather   . Prostate cancer Paternal Grandfather   . Aneurysm Father   .  Heart attack Father 51  . Coronary artery disease Father   . Colon polyps Mother   . Heart disease Mother   . Coronary artery disease Mother   . Migraines Mother   . Breast cancer Maternal Grandmother   . Colon cancer Paternal Uncle        x 2  . Stomach cancer Brother   . Liver disease Unknown        unsure who it was  . Allergies Daughter     Allergies  Allergen Reactions  . Benadryl [Diphenhydramine Hcl]     "drives me nuts"  . Red Blood Cells     PT. REFUSES ANY BLOOD PRODUCTS - JEHOVAH'S WITNESS.    Current Outpatient Prescriptions on File Prior to Visit  Medication Sig Dispense Refill  . albuterol  (PROVENTIL) (2.5 MG/3ML) 0.083% nebulizer solution USE 1 VIAL VIA NEBULIZER  EVERY 6 HOURS AS NEEDED FOR WHEEZING OR SHORTNESS OF  BREATH 600 mL 1  . aspirin 325 MG tablet Take 325 mg by mouth daily.    Marland Kitchen atorvastatin (LIPITOR) 80 MG tablet Take 1 tablet (80 mg total) by mouth daily. 30 tablet 3  . budesonide-formoterol (SYMBICORT) 160-4.5 MCG/ACT inhaler Inhale 2 puffs into the lungs 2 (two) times daily. 1 Inhaler 3  . cetirizine (ZYRTEC) 10 MG tablet Take 10 mg by mouth daily.    . clonazePAM (KLONOPIN) 1 MG tablet Take 1 tablet (1 mg total) by mouth daily. 90 tablet 0  . diclofenac sodium (VOLTAREN) 1 % GEL Apply 2 g topically 4 (four) times daily. (Patient taking differently: Apply 2 g topically 4 (four) times daily as needed (pain). ) 720 g 0  . ezetimibe (ZETIA) 10 MG tablet Take 1 tablet (10 mg total) by mouth daily. 90 tablet 1  . fenofibrate (TRICOR) 145 MG tablet Take 1 tablet by mouth  daily 90 tablet 1  . fish oil-omega-3 fatty acids 1000 MG capsule Take 2 g by mouth daily.     . Fluticasone-Salmeterol (ADVAIR) 100-50 MCG/DOSE AEPB Inhale 1 puff into the lungs 2 (two) times daily. 1 each 3  . gabapentin (NEURONTIN) 300 MG capsule TAKE 1 CAPSULE BY MOUTH AT  6AM, 11:30AM AND 4:30PM AND TAKE 3 CAPSULES AT 10:00PM 540 capsule 1  . isosorbide mononitrate (IMDUR) 30 MG 24 hr tablet Take 1 tablet (30 mg total) by mouth daily. 30 tablet 0  . ketoconazole (NIZORAL) 2 % shampoo APPLY TOPICALLY DAILY 360 mL 1  . lansoprazole (PREVACID) 15 MG capsule Take 15 mg by mouth daily.    Marland Kitchen lisinopril (PRINIVIL,ZESTRIL) 10 MG tablet Take 1 tablet (10 mg total) by mouth daily. 90 tablet 1  . loperamide (IMODIUM) 2 MG capsule Take 1 capsule (2 mg total) by mouth as needed for diarrhea or loose stools. 12 capsule 0  . meloxicam (MOBIC) 7.5 MG tablet TAKE 1 TABLET BY MOUTH  DAILY 90 tablet 0  . metFORMIN (GLUCOPHAGE-XR) 500 MG 24 hr tablet TAKE 2 TABLETS BY MOUTH  DAILY WITH BREAKFAST 180 tablet 1  .  metoprolol (LOPRESSOR) 50 MG tablet Take 1 tablet (50 mg total) by mouth once as directed. 1 tablet 0  . montelukast (SINGULAIR) 10 MG tablet TAKE 1 TABLET BY MOUTH AT  BEDTIME 90 tablet 1  . Multiple Vitamin (MULTIVITAMIN WITH MINERALS) TABS tablet Take 1 tablet by mouth daily.    . naproxen sodium (ALEVE) 220 MG tablet Take 440 mg by mouth 2 (two) times daily. Take 440 mg in the  morning and 220 mg in the evening.    . nitroGLYCERIN (NITROSTAT) 0.4 MG SL tablet Place 1 tablet (0.4 mg total) under the tongue every 5 (five) minutes as needed for chest pain. 30 tablet 1  . ondansetron (ZOFRAN ODT) 4 MG disintegrating tablet Take 1 tablet (4 mg total) by mouth every 8 (eight) hours as needed for nausea or vomiting. 20 tablet 0  . ONETOUCH VERIO test strip CHECK BLOOD SUGAR 3 TIMES  DAILY 300 each 1  . ramelteon (ROZEREM) 8 MG tablet Take 1 tablet (8 mg total) by mouth at bedtime. 30 tablet 5  . sertraline (ZOLOFT) 50 MG tablet TAKE 1 TABLET BY MOUTH  DAILY 90 tablet 1  . tamsulosin (FLOMAX) 0.4 MG CAPS capsule TAKE 1 CAPSULE BY MOUTH  DAILY 90 capsule 1  . tiZANidine (ZANAFLEX) 2 MG tablet TAKE 1 TABLET BY MOUTH  EVERY 6 HOURS AS NEEDED FOR MUSCLE SPASM(S) 270 tablet 2  . traMADol (ULTRAM) 50 MG tablet Take 1 tablet (50 mg total) by mouth every 6 (six) hours as needed for severe pain. 120 tablet 5  . traZODone (DESYREL) 100 MG tablet TAKE 1 TABLET BY MOUTH AT  BEDTIME 90 tablet 1  . zolpidem (AMBIEN) 10 MG tablet Take 1 tablet (10 mg total) by mouth at bedtime as needed for sleep. 90 tablet 0  . [DISCONTINUED] niacin (NIASPAN) 1000 MG CR tablet Take 1 tablet (1,000 mg total) by mouth at bedtime. 30 tablet 6   No current facility-administered medications on file prior to visit.     There were no vitals taken for this visit.      Objective:   Physical Exam  Constitutional: He is oriented to person, place, and time. He appears well-developed and well-nourished. No distress.  HENT:  Head:  Normocephalic and atraumatic.  Right Ear: External ear normal.  Left Ear: External ear normal.  Nose: Nose normal.  Mouth/Throat: Oropharynx is clear and moist. No oropharyngeal exudate.  Eyes: Conjunctivae and EOM are normal. Pupils are equal, round, and reactive to light. Right eye exhibits no discharge. Left eye exhibits no discharge. No scleral icterus.  Neck: Normal range of motion. Neck supple. No thyromegaly present.  Cardiovascular: Normal rate, regular rhythm, normal heart sounds and intact distal pulses.  Exam reveals no gallop and no friction rub.   No murmur heard. Pulmonary/Chest: Effort normal. He has wheezes. He has no rales. He exhibits no tenderness.  Lymphadenopathy:    He has no cervical adenopathy.  Neurological: He is alert and oriented to person, place, and time.  Skin: Skin is warm and dry. No rash noted. He is not diaphoretic. No erythema. No pallor.  Psychiatric: He has a normal mood and affect. His behavior is normal. Judgment and thought content normal.  Nursing note and vitals reviewed.     Assessment & Plan:  1. Bronchitis - predniSONE (DELTASONE) 10 MG tablet; 40 mg x 3 days, 20 mg x 3 days, 10 mg x 3 days  Dispense: 21 tablet; Refill: 0 - azithromycin (ZITHROMAX Z-PAK) 250 MG tablet; Take 2 tablets on Day 1.  Then take 1 tablet daily.  Dispense: 6 tablet; Refill: 0 - benzonatate (TESSALON) 100 MG capsule; Take 1 capsule (100 mg total) by mouth 2 (two) times daily as needed for cough.  Dispense: 20 capsule; Refill: 0 - Follow up with PCP if no improvement in the next 2-3 days or sooner if symptoms worsen or fever develops   Dorothyann Peng, NP

## 2017-03-20 ENCOUNTER — Other Ambulatory Visit: Payer: Self-pay | Admitting: Family

## 2017-04-17 ENCOUNTER — Ambulatory Visit (HOSPITAL_BASED_OUTPATIENT_CLINIC_OR_DEPARTMENT_OTHER): Payer: Medicare Other | Admitting: Physical Medicine & Rehabilitation

## 2017-04-17 ENCOUNTER — Encounter: Payer: Self-pay | Admitting: Physical Medicine & Rehabilitation

## 2017-04-17 ENCOUNTER — Encounter: Payer: Medicare Other | Attending: Physical Medicine & Rehabilitation

## 2017-04-17 VITALS — BP 99/63 | HR 82 | Resp 14

## 2017-04-17 DIAGNOSIS — I2510 Atherosclerotic heart disease of native coronary artery without angina pectoris: Secondary | ICD-10-CM | POA: Diagnosis not present

## 2017-04-17 DIAGNOSIS — I422 Other hypertrophic cardiomyopathy: Secondary | ICD-10-CM

## 2017-04-17 DIAGNOSIS — M47816 Spondylosis without myelopathy or radiculopathy, lumbar region: Secondary | ICD-10-CM | POA: Insufficient documentation

## 2017-04-17 DIAGNOSIS — M961 Postlaminectomy syndrome, not elsewhere classified: Secondary | ICD-10-CM | POA: Diagnosis not present

## 2017-04-17 DIAGNOSIS — M797 Fibromyalgia: Secondary | ICD-10-CM

## 2017-04-17 DIAGNOSIS — E1142 Type 2 diabetes mellitus with diabetic polyneuropathy: Secondary | ICD-10-CM

## 2017-04-17 DIAGNOSIS — I119 Hypertensive heart disease without heart failure: Secondary | ICD-10-CM | POA: Diagnosis not present

## 2017-04-17 DIAGNOSIS — M5416 Radiculopathy, lumbar region: Secondary | ICD-10-CM | POA: Diagnosis not present

## 2017-04-17 DIAGNOSIS — M7061 Trochanteric bursitis, right hip: Secondary | ICD-10-CM | POA: Insufficient documentation

## 2017-04-17 DIAGNOSIS — E785 Hyperlipidemia, unspecified: Secondary | ICD-10-CM | POA: Diagnosis not present

## 2017-04-17 DIAGNOSIS — M4696 Unspecified inflammatory spondylopathy, lumbar region: Secondary | ICD-10-CM | POA: Diagnosis not present

## 2017-04-17 DIAGNOSIS — K219 Gastro-esophageal reflux disease without esophagitis: Secondary | ICD-10-CM | POA: Insufficient documentation

## 2017-04-17 MED ORDER — TRAMADOL HCL 50 MG PO TABS: 50.0000 mg | ORAL_TABLET | Freq: Four times a day (QID) | ORAL | 5 refills | Status: DC | PRN

## 2017-04-17 NOTE — Progress Notes (Signed)
Subjective:    Patient ID: Cody Raymond, male    DOB: 08/08/1971, 46 y.o.   MRN: [id number]  HPI  60lb weight loss watching diet, no [employer], No pizza 20lb per month, eating more fruits, used to go out for breakfast every am  Fibromyalgia pain back and shoulder  15-20 min walking 2 x per wk Enc to increase to every other day Pain Inventory Average Pain 10 Pain Right Now 8 My pain is sharp, burning, dull, stabbing, tingling and aching  In the last 24 hours, has pain interfered with the following? General activity 4 Relation with others 4 Enjoyment of life 4 What TIME of day is your pain at its worst? all Sleep (in general) Fair  Pain is worse with: walking, bending, sitting and standing Pain improves with: therapy/exercise, medication and injections Relief from Meds: 4  Mobility walk with assistance use a cane how many minutes can you walk? 15 ability to climb steps?  no do you drive?  yes Do you have any goals in this area?  yes  Function disabled: date disabled . I need assistance with the following:  bathing Do you have any goals in this area?  yes  Neuro/Psych weakness numbness tremor tingling spasms dizziness confusion depression anxiety  Prior Studies Any changes since last visit?  no  Physicians involved in your care Any changes since last visit?  no   Family History  Problem Relation Age of Onset  . Colon cancer Paternal Grandfather   . Prostate cancer Paternal Grandfather   . Aneurysm Father   . Heart attack Father 37  . Coronary artery disease Father   . Colon polyps Mother   . Heart disease Mother   . Coronary artery disease Mother   . Migraines Mother   . Breast cancer Maternal Grandmother   . Colon cancer Paternal Uncle        x 2  . Stomach cancer Brother   . Liver disease Unknown        unsure who it was  . Allergies Daughter    Social History   Social History  . Marital status: Married    Spouse name: N/A  .  Number of children: N/A  . Years of education: N/A   Social History Main Topics  . Smoking status: Never Smoker  . Smokeless tobacco: Never Used  . Alcohol use No  . Drug use: No  . Sexual activity: Not Asked   Other Topics Concern  . None   Social History Narrative   Holter monitor 08/2010: PVCs and sinus tachy.   Sleep Study (02/2008): mild sleep apnea, no indication for CPAP.   Past Surgical History:  Procedure Laterality Date  . APPENDECTOMY    . BACK SURGERY    . CARDIAC CATHETERIZATION  2007   Clean Cardiac Cath ([place]), with RCA 30% narrowing likely due to catheter induced spasm in 09/02/10.  Marland Kitchen CARDIAC CATHETERIZATION  09/02/2010   mod. nonobstructive disease in the RCA and CX, tortuous LAD  . COLONOSCOPY W/ POLYPECTOMY  03/17/11   diminutive polyp  . LOOP RECORDER EXPLANT N/A 01/14/2014   Procedure: LOOP RECORDER EXPLANT;  Surgeon: Sanda Klein, MD;  Location: [place] [facility]ATH [facility];  Service: Cardiovascular;  Laterality: N/A;  . LOOP RECORDER IMPLANT  04/06/2012   Reveal XT 3559  . LOOP RECORDER IMPLANT N/A 04/06/2012   Procedure: LOOP RECORDER IMPLANT;  Surgeon: Sanda Klein, MD;  Location: [place] [facility];  Service: Cardiovascular;  Laterality: N/A;  .  NM MYOCAR PERF WALL MOTION  01/25/2008   mild anteroapical wall ischemia  . PATENT DUCTUS ARTERIOUS REPAIR     at age 45   Past Medical History:  Diagnosis Date  . Aneurysm ([place])   . Asthma   . Blood transfusion   . CAD (coronary artery disease), non obstructive on cath 2011 04/05/2012  . Coronary artery disease   . Diabetes mellitus   . Enlarged prostate   . GERD (gastroesophageal reflux disease)   . H/O syncope   . Heart disease   . History of repair of patent ductus arteriosus 07/23/2016  . Hyperlipidemia   . Hypertension   . 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 Refusal of blood transfusions as patient is Jehovah's Witness    There were no vitals taken for this visit.  Opioid Risk Score:   Fall Risk  Score:  `1  Depression screen PHQ 2/9  Depression screen [facility] 2/9 06/08/2016 04/20/2016 12/25/2014  Decreased Interest 1 0 2  Down, Depressed, Hopeless 1 1 1   PHQ - 2 Score 2 1 3   Altered sleeping 3 - 1  Tired, decreased energy 1 - 1  Change in appetite 0 - 0  Feeling bad or failure about yourself  (No Data) - 2  Trouble concentrating (No Data) - 2  Moving slowly or fidgety/restless (No Data) - 2  Suicidal thoughts 0 - 0  PHQ-9 Score 6 - 11  Difficult doing work/chores Somewhat difficult - -  Some recent data might be hidden    Review of Systems  Constitutional: Positive for diaphoresis and unexpected weight change.  HENT: Negative.   Eyes: Negative.   Respiratory: Positive for cough, shortness of breath and wheezing.   Gastrointestinal: Positive for abdominal pain, constipation, diarrhea, nausea and vomiting.  Endocrine: Heat intolerance: tingling.       High blood sugar  Genitourinary: Positive for difficulty urinating.  Musculoskeletal: Positive for arthralgias, back pain, gait problem and neck pain.  Skin: Negative.   Allergic/Immunologic: Negative.   Neurological: Positive for dizziness, tremors, weakness and numbness.       Tingling   Psychiatric/Behavioral: Positive for confusion and dysphoric mood. The patient is nervous/anxious.   All other systems reviewed and are negative.      Objective:   Physical Exam  Constitutional: He is oriented to person, place, and time. He appears well-developed and well-nourished.  HENT:  Head: Normocephalic and atraumatic.  Eyes: Pupils are equal, round, and reactive to light. Conjunctivae and EOM are normal.  Neck: Normal range of motion. Neck supple.  Musculoskeletal:       Lumbar back: He exhibits decreased range of motion and tenderness.  Tenderness palpation bilateral lumbar paraspinal muscles   Tenderness over the right greater trochanter of the hip. No pain with hip range of motion.  Neurological: He is alert and oriented to  person, place, and time. He has normal strength. Gait abnormal.  Using straight cane for gait.  Negative straight leg raising bilaterally  Psychiatric: He has a normal mood and affect.  Nursing note and vitals reviewed.         Assessment & Plan:  1. Lumbar postlaminectomy syndrome. Continue tramadol 50 mg 4 times a day No signs of any misuse.  His PCP is prescribing gabapentin 300 mg 3 times a day and 900 g daily at bedtime. He does have some chronic radicular pain.  Also, with history of diabetes, some of his lower extremity pain may be neuropathic, EMG  may be helpful in differentiating.  2. Fibromyalgia syndrome, gabapentin, helping with some symptomatology. Also encourage walking at least every other day

## 2017-04-17 NOTE — Patient Instructions (Signed)
Congrats on Weight loss!  Walk every other day.

## 2017-04-24 ENCOUNTER — Encounter: Payer: Self-pay | Admitting: Family

## 2017-04-24 ENCOUNTER — Ambulatory Visit (INDEPENDENT_AMBULATORY_CARE_PROVIDER_SITE_OTHER): Payer: Medicare Other | Admitting: Family

## 2017-04-24 ENCOUNTER — Ambulatory Visit (HOSPITAL_BASED_OUTPATIENT_CLINIC_OR_DEPARTMENT_OTHER)
Admission: RE | Admit: 2017-04-24 | Discharge: 2017-04-24 | Disposition: A | Discharge status: Home / Self Care | Payer: Medicare Other | Source: Ambulatory Visit | Attending: Family | Admitting: Family

## 2017-04-24 VITALS — BP 103/72 | HR 87 | Temp 98.3°F | Resp 16 | Ht 68.0 in | Wt 186.8 lb

## 2017-04-24 DIAGNOSIS — I10 Essential (primary) hypertension: Secondary | ICD-10-CM

## 2017-04-24 DIAGNOSIS — R0781 Pleurodynia: Secondary | ICD-10-CM

## 2017-04-24 DIAGNOSIS — E1142 Type 2 diabetes mellitus with diabetic polyneuropathy: Secondary | ICD-10-CM

## 2017-04-24 DIAGNOSIS — K047 Periapical abscess without sinus: Secondary | ICD-10-CM | POA: Diagnosis not present

## 2017-04-24 DIAGNOSIS — F418 Other specified anxiety disorders: Secondary | ICD-10-CM

## 2017-04-24 DIAGNOSIS — J45901 Unspecified asthma with (acute) exacerbation: Secondary | ICD-10-CM

## 2017-04-24 DIAGNOSIS — E785 Hyperlipidemia, unspecified: Secondary | ICD-10-CM | POA: Diagnosis not present

## 2017-04-24 LAB — LIPID PANEL
CHOLESTEROL: 181 mg/dL (ref 0–200)
HDL: 41.1 mg/dL (ref >39.00)
NonHDL: 140.33
Total CHOL/HDL Ratio: 4
Triglycerides: 260 mg/dL — ABNORMAL HIGH (ref 0.0–149.0)
VLDL: 52 mg/dL — AB (ref 0.0–40.0)

## 2017-04-24 LAB — COMPREHENSIVE METABOLIC PANEL
ALBUMIN: 4.1 g/dL (ref 3.5–5.2)
ALK PHOS: 83 U/L (ref 39–117)
ALT: 17 U/L (ref 0–53)
AST: 17 U/L (ref 0–37)
BILIRUBIN TOTAL: 0.6 mg/dL (ref 0.2–1.2)
BUN: 20 mg/dL (ref 6–23)
CALCIUM: 9.1 mg/dL (ref 8.4–10.5)
CHLORIDE: 102 meq/L (ref 96–112)
CO2: 25 mEq/L (ref 19–32)
CREATININE: 0.9 mg/dL (ref 0.40–1.50)
GFR: 96.37 mL/min (ref >60.00)
Glucose, Bld: 82 mg/dL (ref 70–99)
Potassium: 3.9 mEq/L (ref 3.5–5.1)
SODIUM: 136 meq/L (ref 135–145)
Total Protein: 7.1 g/dL (ref 6.0–8.3)

## 2017-04-24 LAB — LDL CHOLESTEROL, DIRECT: LDL DIRECT: 94 mg/dL

## 2017-04-24 MED ORDER — PREDNISONE 10 MG PO TABS: ORAL_TABLET | ORAL | 0 refills | Status: DC

## 2017-04-24 NOTE — Assessment & Plan Note (Signed)
Having some depression symptoms. I encouraged him to establish with a counselor. Continue zoloft and prn klonopin for anxiety.

## 2017-04-24 NOTE — Progress Notes (Signed)
Subjective:    Patient ID: Cody Raymond, male    DOB: 08-Mar-1971, 46 y.o.   MRN: [id number]  HPI  Ms. Lober is a 46 yr old male who presents today for follow up of multiple medical problems.  1) HTN-  BP Readings from Last 3 Encounters:  04/24/17 103/72  04/17/17 99/63  01/16/17 108/63   2) Hyperlipidemia- maintained on lipitor, tricor, fish oil.  Has fibromyalgia pain.   Lab Results  Component Value Date   CHOL 186 06/08/2016   HDL 36.40 (L) 06/08/2016   LDLCALC 122 (H) 04/17/2015   LDLDIRECT 102.0 06/08/2016   TRIG 211.0 (H) 06/08/2016   CHOLHDL 5 06/08/2016   3) Asthma- maintained on advair/singulair. Has had some wheezing due to the rain.  Reports that he did have a pain in his left lung, worse with a deep breath. Has been going on x 1 week.   4) DM2- on metformin xr 1000mg  once daily. Using gabapentin for peripheral neuropathy.  Lab Results  Component Value Date   HGBA1C 5.8 01/16/2017   HGBA1C 5.4 06/08/2016   HGBA1C 6.4 09/30/2015   Lab Results  Component Value Date   MICROALBUR 1.15 11/20/2013   LDLCALC 122 (H) 04/17/2015   CREATININE 0.86 12/21/2016   Anxiety/Depression- maintained on zoloft 50mg .  Using trazodone HS for sleep as well as ambien nightly.Use klonopin prn anxiety. Uses 1-2 times a day. Reports that he is working on getting established with a Social worker. Sometimes "wants to be left alone."  This is generally when "something happens."   Dental abscess- dental wants a root canal.  He is trying to get money together. Reports that he is having pain in the right lower posterior molar.    Review of Systems    see HPI  Past Medical History:  Diagnosis Date  . Aneurysm ([place])   . Asthma   . Blood transfusion   . CAD (coronary artery disease), non obstructive on cath 2011 04/05/2012  . Coronary artery disease   . Diabetes mellitus   . Enlarged prostate   . GERD (gastroesophageal reflux disease)   . H/O syncope   . Heart disease   . History  of repair of patent ductus arteriosus 07/23/2016  . Hyperlipidemia   . Hypertension   . 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 Refusal of blood transfusions as patient is Jehovah's Witness      Social History   Social History  . Marital status: Married    Spouse name: N/A  . Number of children: N/A  . Years of education: N/A   Occupational History  . Not on file.   Social History Main Topics  . Smoking status: Never Smoker  . Smokeless tobacco: Never Used  . Alcohol use No  . Drug use: No  . Sexual activity: Not on file   Other Topics Concern  . Not on file   Social History Narrative   Holter monitor 08/2010: PVCs and sinus tachy.   Sleep Study (02/2008): mild sleep apnea, no indication for CPAP.    Past Surgical History:  Procedure Laterality Date  . APPENDECTOMY    . BACK SURGERY    . CARDIAC CATHETERIZATION  2007   Clean Cardiac Cath ([place]), with RCA 30% narrowing likely due to catheter induced spasm in 09/02/10.  Marland Kitchen CARDIAC CATHETERIZATION  09/02/2010   mod. nonobstructive disease in the RCA and CX, tortuous LAD  . COLONOSCOPY W/ POLYPECTOMY  03/17/11   diminutive polyp  .  LOOP RECORDER EXPLANT N/A 01/14/2014   Procedure: LOOP RECORDER EXPLANT;  Surgeon: Sanda Klein, MD;  Location: [place] CATH LAB;  Service: Cardiovascular;  Laterality: N/A;  . LOOP RECORDER IMPLANT  04/06/2012   Reveal XT 7026  . LOOP RECORDER IMPLANT N/A 04/06/2012   Procedure: LOOP RECORDER IMPLANT;  Surgeon: Sanda Klein, MD;  Location: [place] C[facility];  Service: Cardiovascular;  Laterality: N/A;  . NM MYOCAR PERF WALL MOTION  01/25/2008   mild anteroapical wall ischemia  . PATENT DUCTUS ARTERIOUS REPAIR     at age 77    Family History  Problem Relation Age of Onset  . Colon cancer Paternal Grandfather   . Prostate cancer Paternal Grandfather   . Aneurysm Father   . Heart attack Father 18  . Coronary artery disease Father   . Colon polyps Mother   . Heart disease Mother   .  Coronary artery disease Mother   . Migraines Mother   . Breast cancer Maternal Grandmother   . Colon cancer Paternal Uncle        x 2  . Stomach cancer Brother   . Liver disease Unknown        unsure who it was  . Allergies Daughter     Allergies  Allergen Reactions  . Benadryl [Diphenhydramine Hcl]     "drives me nuts"  . Red Blood Cells     PT. REFUSES ANY BLOOD PRODUCTS - JEHOVAH'S WITNESS.    Current Outpatient Prescriptions on File Prior to Visit  Medication Sig Dispense Refill  . albuterol (PROVENTIL) (2.5 MG/3ML) 0.083% nebulizer solution USE 1 VIAL VIA NEBULIZER  EVERY 6 HOURS AS NEEDED FOR WHEEZING OR SHORTNESS OF  BREATH 600 mL 1  . aspirin 325 MG tablet Take 325 mg by mouth daily.    Marland Kitchen atorvastatin (LIPITOR) 80 MG tablet Take 1 tablet (80 mg total) by mouth daily. 30 tablet 3  . budesonide-formoterol (SYMBICORT) 160-4.5 MCG/ACT inhaler Inhale 2 puffs into the lungs 2 (two) times daily. 1 Inhaler 3  . cetirizine (ZYRTEC) 10 MG tablet Take 10 mg by mouth daily.    . clonazePAM (KLONOPIN) 1 MG tablet Take 1 tablet (1 mg total) by mouth daily. 90 tablet 0  . diclofenac sodium (VOLTAREN) 1 % GEL Apply 2 g topically 4 (four) times daily. (Patient taking differently: Apply 2 g topically 4 (four) times daily as needed (pain). ) 720 g 0  . ezetimibe (ZETIA) 10 MG tablet Take 1 tablet (10 mg total) by mouth daily. 90 tablet 1  . fenofibrate (TRICOR) 145 MG tablet Take 1 tablet by mouth  daily 90 tablet 1  . fish oil-omega-3 fatty acids 1000 MG capsule Take 2 g by mouth daily.     . Fluticasone-Salmeterol (ADVAIR) 100-50 MCG/DOSE AEPB Inhale 1 puff into the lungs 2 (two) times daily. 1 each 3  . gabapentin (NEURONTIN) 300 MG capsule TAKE 1 CAPSULE BY MOUTH AT  6AM, 11:30AM AND 4:30PM AND TAKE 3 CAPSULES AT 10:00PM 540 capsule 1  . isosorbide mononitrate (IMDUR) 30 MG 24 hr tablet Take 1 tablet (30 mg total) by mouth daily. 30 tablet 0  . ketoconazole (NIZORAL) 2 % shampoo APPLY  TOPICALLY DAILY 360 mL 1  . lansoprazole (PREVACID) 15 MG capsule Take 15 mg by mouth daily.    Marland Kitchen lisinopril (PRINIVIL,ZESTRIL) 10 MG tablet Take 1 tablet (10 mg total) by mouth daily. 90 tablet 1  . loperamide (IMODIUM) 2 MG capsule Take 1 capsule (2 mg total)  by mouth as needed for diarrhea or loose stools. 12 capsule 0  . meloxicam (MOBIC) 7.5 MG tablet TAKE 1 TABLET BY MOUTH  DAILY 90 tablet 0  . metFORMIN (GLUCOPHAGE-XR) 500 MG 24 hr tablet TAKE 2 TABLETS BY MOUTH  DAILY WITH BREAKFAST 180 tablet 1  . metoprolol (LOPRESSOR) 50 MG tablet Take 1 tablet (50 mg total) by mouth once as directed. 1 tablet 0  . montelukast (SINGULAIR) 10 MG tablet TAKE 1 TABLET BY MOUTH AT  BEDTIME 90 tablet 1  . Multiple Vitamin (MULTIVITAMIN WITH MINERALS) TABS tablet Take 1 tablet by mouth daily.    . naproxen sodium (ALEVE) 220 MG tablet Take 440 mg by mouth 2 (two) times daily. Take 440 mg in the morning and 220 mg in the evening.    . nitroGLYCERIN (NITROSTAT) 0.4 MG SL tablet Place 1 tablet (0.4 mg total) under the tongue every 5 (five) minutes as needed for chest pain. 30 tablet 1  . ondansetron (ZOFRAN ODT) 4 MG disintegrating tablet Take 1 tablet (4 mg total) by mouth every 8 (eight) hours as needed for nausea or vomiting. 20 tablet 0  . ONETOUCH VERIO test strip CHECK BLOOD SUGAR 3 TIMES  DAILY 300 each 1  . ramelteon (ROZEREM) 8 MG tablet Take 1 tablet (8 mg total) by mouth at bedtime. 30 tablet 5  . sertraline (ZOLOFT) 50 MG tablet TAKE 1 TABLET BY MOUTH  DAILY 90 tablet 1  . tamsulosin (FLOMAX) 0.4 MG CAPS capsule TAKE 1 CAPSULE BY MOUTH  DAILY 90 capsule 1  . tiZANidine (ZANAFLEX) 2 MG tablet TAKE 1 TABLET BY MOUTH  EVERY 6 HOURS AS NEEDED FOR MUSCLE SPASM(S) 270 tablet 2  . traMADol (ULTRAM) 50 MG tablet Take 1 tablet (50 mg total) by mouth every 6 (six) hours as needed for severe pain. 120 tablet 5  . traZODone (DESYREL) 100 MG tablet TAKE 1 TABLET BY MOUTH AT  BEDTIME 90 tablet 1  . zolpidem  (AMBIEN) 10 MG tablet Take 1 tablet (10 mg total) by mouth at bedtime as needed for sleep. 90 tablet 0  . [DISCONTINUED] niacin (NIASPAN) 1000 MG CR tablet Take 1 tablet (1,000 mg total) by mouth at bedtime. 30 tablet 6   No current facility-administered medications on file prior to visit.     BP 103/72 (BP Location: Right Arm, Cuff Size: Large)   Pulse 87   Temp 98.3 F (36.8 C) (Oral)   Resp 16   Ht 5\' 8"  (1.727 m)   Wt 186 lb 12.8 oz (84.7 kg)   SpO2 100%   BMI 28.40 kg/m    Objective:   Physical Exam  Constitutional: He is oriented to person, place, and time. He appears well-developed and well-nourished. No distress.  HENT:  Head: Normocephalic and atraumatic.  No significant swelling noted surrounding right lower posterior molar  Cardiovascular: Normal rate and regular rhythm.   No murmur heard. Pulmonary/Chest: Effort normal. No respiratory distress. He has wheezes. He has no rales.  Bilateral wheezing  Musculoskeletal: He exhibits no edema.  Neurological: He is alert and oriented to person, place, and time.  Skin: Skin is warm and dry.  Psychiatric: He has a normal mood and affect. His behavior is normal. Thought content normal.          Assessment & Plan:  Dental abscess- rx with augmentin, advised pt to follow up with his dentist.

## 2017-04-24 NOTE — Assessment & Plan Note (Signed)
rx with pred taper, continue advair/ singulair, prn albuterol. Obtain cxr to rule out pna.

## 2017-04-24 NOTE — Assessment & Plan Note (Signed)
Stable on current meds.  Continue same. 

## 2017-04-24 NOTE — Assessment & Plan Note (Signed)
Clinically stable, continue metformin and diet.

## 2017-04-24 NOTE — Patient Instructions (Signed)
Please complete lab work prior to leaving. Complete chest x ray on the first floor. Begin prednisone for wheezing. Call if symptoms worsen or if they fail to improve. Begin augmentin for your dental infection- please schedule follow up with your dentist.  Schedule an appointment with a counselor.

## 2017-04-25 ENCOUNTER — Telehealth: Payer: Self-pay | Admitting: Family

## 2017-04-25 ENCOUNTER — Encounter: Payer: Self-pay | Admitting: Family

## 2017-04-25 ENCOUNTER — Other Ambulatory Visit: Payer: Self-pay | Admitting: *Deleted

## 2017-04-25 DIAGNOSIS — T148XXA Other injury of unspecified body region, initial encounter: Secondary | ICD-10-CM

## 2017-04-25 LAB — HEMOGLOBIN A1C
HEMOGLOBIN A1C: 5.4 % (ref <5.7)
Mean Plasma Glucose: 108 mg/dL

## 2017-04-25 MED ORDER — EZETIMIBE 10 MG PO TABS: 10.0000 mg | ORAL_TABLET | Freq: Every day | ORAL | 1 refills | Status: DC

## 2017-04-25 MED ORDER — ATORVASTATIN CALCIUM 80 MG PO TABS: 80.0000 mg | ORAL_TABLET | Freq: Every day | ORAL | 1 refills | Status: DC

## 2017-04-25 NOTE — Telephone Encounter (Signed)
Patient came in today with his wife who was seen for an office visit. We discussed finding of chronic rib fractures on the left on his chest x-ray yesterday. He does have some mild local tenderness there. He denies history of injury.  Advised patient that I would like for him to complete a bone density for further evaluation.

## 2017-04-25 NOTE — Progress Notes (Signed)
Faxed refill request received from [facility] for Atorvastatin and Zetia Refill sent per [facility] refill protocol/SLS

## 2017-04-27 ENCOUNTER — Other Ambulatory Visit: Payer: Self-pay | Admitting: Family

## 2017-05-01 ENCOUNTER — Telehealth: Payer: Self-pay | Admitting: *Deleted

## 2017-05-01 NOTE — Telephone Encounter (Signed)
Received fax from [facility] for voltaren gel. Form forwarded to PCP for completion / signature.

## 2017-05-05 ENCOUNTER — Telehealth: Payer: Self-pay | Admitting: *Deleted

## 2017-05-05 NOTE — Telephone Encounter (Signed)
I spoke with Cody Raymond and what he needs is a letter from MD. He was headed this way to see if he could be seen.  There are some openings and April will schedule.

## 2017-05-05 NOTE — Telephone Encounter (Signed)
I really don't do disability examinations. Basically, the patient needs to go to the Soc security office and then request release of records from our office.

## 2017-05-05 NOTE — Telephone Encounter (Signed)
Mr Flamenco is calling with a request to speak with Dr Letta Pate about filing for disability. He said he could come in and see him if necessary.  Please advise.

## 2017-05-05 NOTE — Telephone Encounter (Signed)
Form was not in green folder.  Ivin Booty-- did you get form back?s

## 2017-05-05 NOTE — Telephone Encounter (Signed)
Signed and placed in green folder.

## 2017-05-08 NOTE — Telephone Encounter (Signed)
Paperwork was faxed on Fri, 05/05/17; forwarded to Tricia/SLS 08/20

## 2017-05-11 ENCOUNTER — Other Ambulatory Visit: Payer: Self-pay | Admitting: Family

## 2017-05-15 ENCOUNTER — Other Ambulatory Visit: Payer: Self-pay

## 2017-05-17 MED ORDER — DICLOFENAC SODIUM 1 % TD GEL: 2.0000 g | Freq: Four times a day (QID) | TRANSDERMAL | 0 refills | Status: DC | PRN

## 2017-05-17 NOTE — Telephone Encounter (Signed)
Received approval for diclofenac good from 04/27/17 through 09/18/17. Rx sent to [facility].

## 2017-05-17 NOTE — Addendum Note (Signed)
Addended by: Kelle Darting A on: 05/17/2017 01:24 PM   Modules accepted: Orders

## 2017-05-25 ENCOUNTER — Other Ambulatory Visit: Payer: Self-pay

## 2017-05-25 ENCOUNTER — Inpatient Hospital Stay: Admission: RE | Admit: 2017-05-25 | Payer: Self-pay | Source: Ambulatory Visit

## 2017-06-07 ENCOUNTER — Ambulatory Visit (INDEPENDENT_AMBULATORY_CARE_PROVIDER_SITE_OTHER)
Admission: RE | Admit: 2017-06-07 | Discharge: 2017-06-07 | Disposition: A | Discharge status: Home / Self Care | Payer: Medicare Other | Source: Ambulatory Visit | Attending: Family | Admitting: Family

## 2017-06-07 DIAGNOSIS — S2242XA Multiple fractures of ribs, left side, initial encounter for closed fracture: Secondary | ICD-10-CM

## 2017-06-07 DIAGNOSIS — T148XXA Other injury of unspecified body region, initial encounter: Secondary | ICD-10-CM

## 2017-06-08 ENCOUNTER — Ambulatory Visit: Payer: Medicare Other | Admitting: Clinical

## 2017-06-08 ENCOUNTER — Ambulatory Visit (INDEPENDENT_AMBULATORY_CARE_PROVIDER_SITE_OTHER): Payer: Medicare Other | Admitting: Medical

## 2017-06-08 ENCOUNTER — Encounter: Payer: Self-pay | Admitting: Medical

## 2017-06-08 VITALS — BP 100/63 | HR 78 | Temp 98.2°F | Resp 16 | Ht 68.0 in | Wt 191.6 lb

## 2017-06-08 DIAGNOSIS — M25511 Pain in right shoulder: Secondary | ICD-10-CM | POA: Diagnosis not present

## 2017-06-08 DIAGNOSIS — M79601 Pain in right arm: Secondary | ICD-10-CM

## 2017-06-08 DIAGNOSIS — M25521 Pain in right elbow: Secondary | ICD-10-CM | POA: Diagnosis not present

## 2017-06-08 NOTE — Progress Notes (Signed)
Subjective:    Patient ID: Cody Raymond, male    DOB: [birth year], 46 y.o.   MRN: [id number]  HPI  Pt in for some rt shoulder pain since yesterday. He states he fell after loosing balance. He landed on his right shoulder. Pt states some pain anterior shoulder,  upper arm and back of shoulder. During the interview also mentions a little bit of elbow pain.  Pt states fell on ground. Pain was immediate. At night pain was worse.   Pt took meloxicam at night for back pain. Also takes tramadol.   Pt last took meloxicam last.   Review of Systems  Constitutional: Negative for chills, fatigue and fever.  Respiratory: Negative for cough, chest tightness and wheezing.   Cardiovascular: Negative for chest pain and palpitations.  Gastrointestinal: Negative for abdominal pain.  Genitourinary: Negative for dysuria, flank pain and frequency.  Musculoskeletal:       See history of present illness  Neurological: Negative for syncope, weakness, numbness and headaches.       When he fell he did not hit his head  Hematological: Negative for adenopathy. Does not bruise/bleed easily.  Psychiatric/Behavioral: Negative for behavioral problems and confusion.   Past Medical History:  Diagnosis Date  . Aneurysm ([place])   . Asthma   . Blood transfusion   . CAD (coronary artery disease), non obstructive on cath 2011 04/05/2012  . Coronary artery disease   . Diabetes mellitus   . Enlarged prostate   . GERD (gastroesophageal reflux disease)   . H/O syncope   . Heart disease   . History of repair of patent ductus arteriosus 07/23/2016  . Hyperlipidemia   . Hypertension   . 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 Refusal of blood transfusions as patient is Jehovah's Witness      Social History   Social History  . Marital status: Married    Spouse name: N/A  . Number of children: N/A  . Years of education: N/A   Occupational History  . Not on file.   Social History Main Topics  . Smoking  status: Never Smoker  . Smokeless tobacco: Never Used  . Alcohol use No  . Drug use: No  . Sexual activity: Not on file   Other Topics Concern  . Not on file   Social History Narrative   Holter monitor 08/2010: PVCs and sinus tachy.   Sleep Study (02/2008): mild sleep apnea, no indication for CPAP.    Past Surgical History:  Procedure Laterality Date  . APPENDECTOMY    . BACK SURGERY    . CARDIAC CATHETERIZATION  2007   Clean Cardiac Cath ([place]), with RCA 30% narrowing likely due to catheter induced spasm in 09/02/10.  Marland Kitchen CARDIAC CATHETERIZATION  09/02/2010   mod. nonobstructive disease in the RCA and CX, tortuous LAD  . COLONOSCOPY W/ POLYPECTOMY  03/17/11   diminutive polyp  . LOOP RECORDER EXPLANT N/A 01/14/2014   Procedure: LOOP RECORDER EXPLANT;  Surgeon: Sanda Klein, MD;  Location: [facility] CATH LAB;  Service: Cardiovascular;  Laterality: N/A;  . LOOP RECORDER IMPLANT  04/06/2012   Reveal XT 2992  . LOOP RECORDER IMPLANT N/A 04/06/2012   Procedure: LOOP RECORDER IMPLANT;  Surgeon: Sanda Klein, MD;  Location: [facility] CATH LAB;  Service: Cardiovascular;  Laterality: N/A;  . NM MYOCAR PERF WALL MOTION  01/25/2008   mild anteroapical wall ischemia  . PATENT DUCTUS ARTERIOUS REPAIR     at age 74  Family History  Problem Relation Age of Onset  . Colon cancer Paternal Grandfather   . Prostate cancer Paternal Grandfather   . Aneurysm Father   . Heart attack Father 59  . Coronary artery disease Father   . Colon polyps Mother   . Heart disease Mother   . Coronary artery disease Mother   . Migraines Mother   . Breast cancer Maternal Grandmother   . Colon cancer Paternal Uncle        x 2  . Stomach cancer Brother   . Liver disease Unknown        unsure who it was  . Allergies Daughter     Allergies  Allergen Reactions  . Benadryl [Diphenhydramine Hcl]     "drives me nuts"  . Red Blood Cells     PT. REFUSES ANY BLOOD PRODUCTS - JEHOVAH'S WITNESS.    Current  Outpatient Prescriptions on File Prior to Visit  Medication Sig Dispense Refill  . albuterol (PROVENTIL) (2.5 MG/3ML) 0.083% nebulizer solution USE 1 VIAL VIA NEBULIZER  EVERY 6 HOURS AS NEEDED FOR WHEEZING OR SHORTNESS OF  BREATH 600 mL 1  . aspirin 325 MG tablet Take 325 mg by mouth daily.    Marland Kitchen atorvastatin (LIPITOR) 80 MG tablet Take 1 tablet (80 mg total) by mouth daily. 90 tablet 1  . budesonide-formoterol (SYMBICORT) 160-4.5 MCG/ACT inhaler Inhale 2 puffs into the lungs 2 (two) times daily. 1 Inhaler 3  . cetirizine (ZYRTEC) 10 MG tablet Take 10 mg by mouth daily.    . clonazePAM (KLONOPIN) 1 MG tablet Take 1 tablet (1 mg total) by mouth daily. 90 tablet 0  . diclofenac sodium (VOLTAREN) 1 % GEL Apply 2 g topically 4 (four) times daily as needed (pain). 720 g 0  . ezetimibe (ZETIA) 10 MG tablet Take 1 tablet (10 mg total) by mouth daily. 90 tablet 1  . fenofibrate (TRICOR) 145 MG tablet Take 1 tablet by mouth  daily 90 tablet 1  . fish oil-omega-3 fatty acids 1000 MG capsule Take 2 g by mouth daily.     . Fluticasone-Salmeterol (ADVAIR) 100-50 MCG/DOSE AEPB Inhale 1 puff into the lungs 2 (two) times daily. 1 each 3  . gabapentin (NEURONTIN) 300 MG capsule TAKE 1 CAPSULE BY MOUTH AT  6AM, 11:30AM AND 4:30PM AND TAKE 3 CAPSULES AT 10:00PM 540 capsule 1  . isosorbide mononitrate (IMDUR) 30 MG 24 hr tablet Take 1 tablet (30 mg total) by mouth daily. 30 tablet 0  . ketoconazole (NIZORAL) 2 % shampoo APPLY TOPICALLY DAILY 360 mL 1  . lansoprazole (PREVACID) 15 MG capsule Take 15 mg by mouth daily.    Marland Kitchen lisinopril (PRINIVIL,ZESTRIL) 10 MG tablet TAKE 1 TABLET BY MOUTH  DAILY 90 tablet 1  . loperamide (IMODIUM) 2 MG capsule Take 1 capsule (2 mg total) by mouth as needed for diarrhea or loose stools. 12 capsule 0  . meloxicam (MOBIC) 7.5 MG tablet Take 1 tablet (7.5 mg total) by mouth daily. 90 tablet 0  . metFORMIN (GLUCOPHAGE-XR) 500 MG 24 hr tablet Take 2 tablets (1,000 mg total) by mouth daily  with breakfast. 180 tablet 1  . metoprolol (LOPRESSOR) 50 MG tablet Take 1 tablet (50 mg total) by mouth once as directed. 1 tablet 0  . montelukast (SINGULAIR) 10 MG tablet Take 1 tablet (10 mg total) by mouth at bedtime. 90 tablet 1  . Multiple Vitamin (MULTIVITAMIN WITH MINERALS) TABS tablet Take 1 tablet by mouth daily.    Marland Kitchen  naproxen sodium (ALEVE) 220 MG tablet Take 440 mg by mouth 2 (two) times daily. Take 440 mg in the morning and 220 mg in the evening.    . nitroGLYCERIN (NITROSTAT) 0.4 MG SL tablet Place 1 tablet (0.4 mg total) under the tongue every 5 (five) minutes as needed for chest pain. 30 tablet 1  . ondansetron (ZOFRAN ODT) 4 MG disintegrating tablet Take 1 tablet (4 mg total) by mouth every 8 (eight) hours as needed for nausea or vomiting. 20 tablet 0  . ONETOUCH VERIO test strip CHECK BLOOD SUGAR 3 TIMES  DAILY 300 each 1  . predniSONE (DELTASONE) 10 MG tablet 4 tabs by mouth once daily for 2 days, then 3 tabs daily x 2 days, then 2 tabs daily x 2 days, then 1 tab daily x 2 days 20 tablet 0  . ramelteon (ROZEREM) 8 MG tablet Take 1 tablet (8 mg total) by mouth at bedtime. 30 tablet 5  . sertraline (ZOLOFT) 50 MG tablet TAKE 1 TABLET BY MOUTH  DAILY 90 tablet 1  . tamsulosin (FLOMAX) 0.4 MG CAPS capsule TAKE 1 CAPSULE BY MOUTH  DAILY 90 capsule 1  . tiZANidine (ZANAFLEX) 2 MG tablet TAKE 1 TABLET BY MOUTH  EVERY 6 HOURS AS NEEDED FOR MUSCLE SPASM(S) 270 tablet 2  . traMADol (ULTRAM) 50 MG tablet Take 1 tablet (50 mg total) by mouth every 6 (six) hours as needed for severe pain. 120 tablet 5  . traZODone (DESYREL) 100 MG tablet Take 1 tablet (100 mg total) by mouth at bedtime. 90 tablet 1  . zolpidem (AMBIEN) 10 MG tablet Take 1 tablet (10 mg total) by mouth at bedtime as needed for sleep. 90 tablet 0  . [DISCONTINUED] niacin (NIASPAN) 1000 MG CR tablet Take 1 tablet (1,000 mg total) by mouth at bedtime. 30 tablet 6   No current facility-administered medications on file prior to  visit.     BP 100/63   Pulse 78   Temp 98.2 F (36.8 C) (Oral)   Resp 16   Ht 5\' 8"  (1.727 m)   Wt 191 lb 9.6 oz (86.9 kg)   SpO2 100%   BMI 29.13 kg/m       Objective:   Physical Exam   General- No acute distress. Pleasant patient. Neck- Full range of motion, no jvd Lungs- Clear, even and unlabored. Heart- regular rate and rhythm. Neurologic- CNII- XII grossly intact.  Right shoulder-anterior aspect tenderness to palpation moderate. Limited abduction of the right upper extremity. He can't lift his arm above shoulder level. Faint posterior aspect shoulder tenderness to palpation.  Right humerus-mild pain posterior aspect down towards one third distal portion.   Right elbow- mild pain on flexion and extension. Appears minimally swollen. No warmth.  Right forearm-no pain on palpation  Neuro-normal sensation of his digits. Normal grip strength of hand.       Assessment & Plan:  For your right shoulder, right upper arm pain and right elbow pain, I want you to increase her meloxicam to 15 mg at night. If your pain is still present with your tramadol 50 mg dose then advise can increase to 2 tablets every 6 hours. But if you need to take 2 tablets I would also advise that you notify your specialist on dose increase.  Future x-rays of right shoulder, right humerus and right elbow will be placed today. Please get those done first thing tomorrow morning.  We will let you know the results of those x-rays  when they are in.  Follow-up date and further treatment will be determined after x-ray review.   Helaine Yackel, Percell Miller, PA-C

## 2017-06-08 NOTE — Patient Instructions (Signed)
For your right shoulder, right upper arm pain and right elbow pain, I want you to increase her meloxicam to 15 mg at night. If your pain is still present with your tramadol 50 mg dose then advise can increase to 2 tablets every 6 hours. But if you need to take 2 tablets I would also advise that you notify your specialist on dose increase.  Future x-rays of right shoulder, right humerus and right elbow will be placed today. Please get those done first thing tomorrow morning.  We will let you know the results of those x-rays when they are in.  Follow-up date and further treatment will be determined after x-ray review.

## 2017-06-12 ENCOUNTER — Ambulatory Visit (HOSPITAL_BASED_OUTPATIENT_CLINIC_OR_DEPARTMENT_OTHER)
Admission: RE | Admit: 2017-06-12 | Discharge: 2017-06-12 | Disposition: A | Discharge status: Home / Self Care | Payer: Medicare Other | Source: Ambulatory Visit | Attending: Medical | Admitting: Medical

## 2017-06-12 DIAGNOSIS — M778 Other enthesopathies, not elsewhere classified: Secondary | ICD-10-CM | POA: Diagnosis not present

## 2017-06-12 DIAGNOSIS — M25521 Pain in right elbow: Secondary | ICD-10-CM | POA: Diagnosis not present

## 2017-06-12 DIAGNOSIS — M25511 Pain in right shoulder: Secondary | ICD-10-CM | POA: Insufficient documentation

## 2017-06-12 DIAGNOSIS — M79601 Pain in right arm: Secondary | ICD-10-CM | POA: Insufficient documentation

## 2017-06-13 ENCOUNTER — Telehealth: Payer: Self-pay | Admitting: Medical

## 2017-06-13 DIAGNOSIS — M25511 Pain in right shoulder: Secondary | ICD-10-CM

## 2017-06-13 NOTE — Telephone Encounter (Signed)
Referral to sports medicine placed. Please see the referral

## 2017-06-15 ENCOUNTER — Encounter: Payer: Self-pay | Admitting: Family Medicine

## 2017-06-15 ENCOUNTER — Ambulatory Visit (INDEPENDENT_AMBULATORY_CARE_PROVIDER_SITE_OTHER): Payer: Medicare Other | Admitting: Clinical

## 2017-06-15 ENCOUNTER — Ambulatory Visit (INDEPENDENT_AMBULATORY_CARE_PROVIDER_SITE_OTHER): Payer: Medicare Other | Admitting: Family Medicine

## 2017-06-15 DIAGNOSIS — F331 Major depressive disorder, recurrent, moderate: Secondary | ICD-10-CM

## 2017-06-15 DIAGNOSIS — S4991XA Unspecified injury of right shoulder and upper arm, initial encounter: Secondary | ICD-10-CM

## 2017-06-15 NOTE — Patient Instructions (Signed)
You have subacromial bursitis along with rotator cuff strain, triceps, biceps, and pectoralis strains. Try to avoid painful activities (overhead activities, lifting with extended arm) as much as possible. Continue meloxicam as you have been.   Take tramadol, tylenol if needed. Consider capsaicin or biofreeze topically up to 4 times a day also. Start motion exercises I showed you now (arm circles, swings, table slides) 3 sets of 10 once a day. Ok to start bicep curls, tricep kickbacks now with light (< 5 pound) weight. Wait 1-2 weeks before starting the theraband exercises.  3 sets of 10 once a day with yellow theraband. I wouldn't recommend a cortisone injection with an acute injury. Consider physical therapy with transition to home exercise program. If not improving at follow-up we will consider physical therapy. Follow up with me in 1 month.

## 2017-06-18 DIAGNOSIS — S4991XA Unspecified injury of right shoulder and upper arm, initial encounter: Secondary | ICD-10-CM | POA: Insufficient documentation

## 2017-06-18 NOTE — Assessment & Plan Note (Signed)
independently performed and reviewed ultrasound.  No evidence rotator cuff tear.  Reassured patient.  Strains of rotator cuff, triceps, biceps, pectoralis major and subacromial bursitis.  He is taking meloxicam and aleve - advised just to take meloxicam.  Tramadol, tylenol if needed.  Shown motion exercises to do daily.  Bicep, tricep strengthening shown.  Wait 1-2 weeks to start strengthening exercises with theraband.  Consider physical therapy.  F/u in 1 month.

## 2017-06-18 NOTE — Progress Notes (Signed)
PCP and consultation requested by: [name], NP  Subjective:   HPI: Patient is a 46 y.o. male here for right shoulder pain.  Patient reports on 9/20 he accidentally fell in the yard and landed directly onto his right shoulder. Since that time he's had pain anterior shoulder and lateral upper arm. + night pain. Pain up to 7/10 and sharp, worse with all shoulder motions. Takes aleve during the day and meloxicam and gabapentin. Takes tramadol as needed. No numbness or tingling. No skin changes. Right handed.  Past Medical History:  Diagnosis Date  . Aneurysm ([place])   . Asthma   . Blood transfusion   . CAD (coronary artery disease), non obstructive on cath 2011 04/05/2012  . Coronary artery disease   . Diabetes mellitus   . Enlarged prostate   . GERD (gastroesophageal reflux disease)   . H/O syncope   . Heart disease   . History of repair of patent ductus arteriosus 07/23/2016  . Hyperlipidemia   . Hypertension   . 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 Refusal of blood transfusions as patient is Jehovah's Witness     Current Outpatient Prescriptions on File Prior to Visit  Medication Sig Dispense Refill  . albuterol (PROVENTIL) (2.5 MG/3ML) 0.083% nebulizer solution USE 1 VIAL VIA NEBULIZER  EVERY 6 HOURS AS NEEDED FOR WHEEZING OR SHORTNESS OF  BREATH 600 mL 1  . aspirin 325 MG tablet Take 325 mg by mouth daily.    Marland Kitchen atorvastatin (LIPITOR) 80 MG tablet Take 1 tablet (80 mg total) by mouth daily. 90 tablet 1  . budesonide-formoterol (SYMBICORT) 160-4.5 MCG/ACT inhaler Inhale 2 puffs into the lungs 2 (two) times daily. 1 Inhaler 3  . cetirizine (ZYRTEC) 10 MG tablet Take 10 mg by mouth daily.    . clonazePAM (KLONOPIN) 1 MG tablet Take 1 tablet (1 mg total) by mouth daily. 90 tablet 0  . diclofenac sodium (VOLTAREN) 1 % GEL Apply 2 g topically 4 (four) times daily as needed (pain). 720 g 0  . ezetimibe (ZETIA) 10 MG tablet Take 1 tablet (10 mg total) by mouth daily.  90 tablet 1  . fenofibrate (TRICOR) 145 MG tablet Take 1 tablet by mouth  daily 90 tablet 1  . fish oil-omega-3 fatty acids 1000 MG capsule Take 2 g by mouth daily.     . Fluticasone-Salmeterol (ADVAIR) 100-50 MCG/DOSE AEPB Inhale 1 puff into the lungs 2 (two) times daily. 1 each 3  . gabapentin (NEURONTIN) 300 MG capsule TAKE 1 CAPSULE BY MOUTH AT  6AM, 11:30AM AND 4:30PM AND TAKE 3 CAPSULES AT 10:00PM 540 capsule 1  . isosorbide mononitrate (IMDUR) 30 MG 24 hr tablet Take 1 tablet (30 mg total) by mouth daily. 30 tablet 0  . ketoconazole (NIZORAL) 2 % shampoo APPLY TOPICALLY DAILY 360 mL 1  . lansoprazole (PREVACID) 15 MG capsule Take 15 mg by mouth daily.    Marland Kitchen lisinopril (PRINIVIL,ZESTRIL) 10 MG tablet TAKE 1 TABLET BY MOUTH  DAILY 90 tablet 1  . loperamide (IMODIUM) 2 MG capsule Take 1 capsule (2 mg total) by mouth as needed for diarrhea or loose stools. 12 capsule 0  . meloxicam (MOBIC) 7.5 MG tablet Take 1 tablet (7.5 mg total) by mouth daily. 90 tablet 0  . metFORMIN (GLUCOPHAGE-XR) 500 MG 24 hr tablet Take 2 tablets (1,000 mg total) by mouth daily with breakfast. 180 tablet 1  . metoprolol (LOPRESSOR) 50 MG tablet Take 1 tablet (50 mg total) by  mouth once as directed. 1 tablet 0  . montelukast (SINGULAIR) 10 MG tablet Take 1 tablet (10 mg total) by mouth at bedtime. 90 tablet 1  . Multiple Vitamin (MULTIVITAMIN WITH MINERALS) TABS tablet Take 1 tablet by mouth daily.    . naproxen sodium (ALEVE) 220 MG tablet Take 440 mg by mouth 2 (two) times daily. Take 440 mg in the morning and 220 mg in the evening.    . nitroGLYCERIN (NITROSTAT) 0.4 MG SL tablet Place 1 tablet (0.4 mg total) under the tongue every 5 (five) minutes as needed for chest pain. 30 tablet 1  . ondansetron (ZOFRAN ODT) 4 MG disintegrating tablet Take 1 tablet (4 mg total) by mouth every 8 (eight) hours as needed for nausea or vomiting. 20 tablet 0  . ONETOUCH VERIO test strip CHECK BLOOD SUGAR 3 TIMES  DAILY 300 each 1  .  predniSONE (DELTASONE) 10 MG tablet 4 tabs by mouth once daily for 2 days, then 3 tabs daily x 2 days, then 2 tabs daily x 2 days, then 1 tab daily x 2 days 20 tablet 0  . ramelteon (ROZEREM) 8 MG tablet Take 1 tablet (8 mg total) by mouth at bedtime. 30 tablet 5  . sertraline (ZOLOFT) 50 MG tablet TAKE 1 TABLET BY MOUTH  DAILY 90 tablet 1  . tamsulosin (FLOMAX) 0.4 MG CAPS capsule TAKE 1 CAPSULE BY MOUTH  DAILY 90 capsule 1  . tiZANidine (ZANAFLEX) 2 MG tablet TAKE 1 TABLET BY MOUTH  EVERY 6 HOURS AS NEEDED FOR MUSCLE SPASM(S) 270 tablet 2  . traMADol (ULTRAM) 50 MG tablet Take 1 tablet (50 mg total) by mouth every 6 (six) hours as needed for severe pain. 120 tablet 5  . traZODone (DESYREL) 100 MG tablet Take 1 tablet (100 mg total) by mouth at bedtime. 90 tablet 1  . zolpidem (AMBIEN) 10 MG tablet Take 1 tablet (10 mg total) by mouth at bedtime as needed for sleep. 90 tablet 0  . [DISCONTINUED] niacin (NIASPAN) 1000 MG CR tablet Take 1 tablet (1,000 mg total) by mouth at bedtime. 30 tablet 6   No current facility-administered medications on file prior to visit.     Past Surgical History:  Procedure Laterality Date  . APPENDECTOMY    . BACK SURGERY    . CARDIAC CATHETERIZATION  2007   Clean Cardiac Cath ([place]), with RCA 30% narrowing likely due to catheter induced spasm in 09/02/10.  Marland Kitchen CARDIAC CATHETERIZATION  09/02/2010   mod. nonobstructive disease in the RCA and CX, tortuous LAD  . COLONOSCOPY W/ POLYPECTOMY  03/17/11   diminutive polyp  . LOOP RECORDER EXPLANT N/A 01/14/2014   Procedure: LOOP RECORDER EXPLANT;  Surgeon: Sanda Klein, MD;  Location: [facility] CATH LAB;  Service: Cardiovascular;  Laterality: N/A;  . LOOP RECORDER IMPLANT  04/06/2012   Reveal XT 7322  . LOOP RECORDER IMPLANT N/A 04/06/2012   Procedure: LOOP RECORDER IMPLANT;  Surgeon: Sanda Klein, MD;  Location: [facility] CATH LAB;  Service: Cardiovascular;  Laterality: N/A;  . NM MYOCAR PERF WALL MOTION  01/25/2008    mild anteroapical wall ischemia  . PATENT DUCTUS ARTERIOUS REPAIR     at age 70    Allergies  Allergen Reactions  . Benadryl [Diphenhydramine Hcl]     "drives me nuts"  . Red Blood Cells     PT. REFUSES ANY BLOOD PRODUCTS - JEHOVAH'S WITNESS.    Social History   Social History  . Marital status: Married  Spouse name: N/A  . Number of children: N/A  . Years of education: N/A   Occupational History  . Not on file.   Social History Main Topics  . Smoking status: Never Smoker  . Smokeless tobacco: Never Used  . Alcohol use No  . Drug use: No  . Sexual activity: Not on file   Other Topics Concern  . Not on file   Social History Narrative   Holter monitor 08/2010: PVCs and sinus tachy.   Sleep Study (02/2008): mild sleep apnea, no indication for CPAP.    Family History  Problem Relation Age of Onset  . Colon cancer Paternal Grandfather   . Prostate cancer Paternal Grandfather   . Aneurysm Father   . Heart attack Father 53  . Coronary artery disease Father   . Colon polyps Mother   . Heart disease Mother   . Coronary artery disease Mother   . Migraines Mother   . Breast cancer Maternal Grandmother   . Colon cancer Paternal Uncle        x 2  . Stomach cancer Brother   . Liver disease Unknown        unsure who it was  . Allergies Daughter     BP 119/77   Pulse 75   Ht 5\' 8"  (1.727 m)   Wt 190 lb (86.2 kg)   BMI 28.89 kg/m   Review of Systems: See HPI above.     Objective:  Physical Exam:  Gen: NAD, comfortable in exam room  Right shoulder: No swelling, ecchymoses.  No gross deformity. TTP lateral pectoralis, biceps, triceps.  No other tenderness. Active motion limited to 90 abduction and flexion, full IR and ER. Positive Hawkins, Neers. Negative Yergasons. Strength 5/5 with empty can and resisted internal/external rotation.  Pain empty can > ER. Negative apprehension. NV intact distally.  Left shoulder: No swelling, ecchymoses.  No gross  deformity. No TTP. FROM. Strength 5/5 with empty can and resisted internal/external rotation. NV intact distally.   MSK u/s right shoulder: Biceps tendon intact on long and trans views.  AC joint appears normal without geyser sign.  Subscapularis, infraspinatus without tearing - mild amount of increased fluid in subacromial bursa.  Supraspinatus also intact.  Assessment & Plan:  1. Right shoulder injury - independently performed and reviewed ultrasound.  No evidence rotator cuff tear.  Reassured patient.  Strains of rotator cuff, triceps, biceps, pectoralis major and subacromial bursitis.  He is taking meloxicam and aleve - advised just to take meloxicam.  Tramadol, tylenol if needed.  Shown motion exercises to do daily.  Bicep, tricep strengthening shown.  Wait 1-2 weeks to start strengthening exercises with theraband.  Consider physical therapy.  F/u in 1 month.

## 2017-06-28 ENCOUNTER — Ambulatory Visit: Payer: Medicare Other | Admitting: Clinical

## 2017-06-29 ENCOUNTER — Other Ambulatory Visit: Payer: Self-pay | Admitting: Family

## 2017-07-07 ENCOUNTER — Telehealth: Payer: Self-pay | Admitting: *Deleted

## 2017-07-07 NOTE — Telephone Encounter (Signed)
Received request for Medical records from [facility], forwarded to [place] for email/scan/SLS 10/19

## 2017-07-11 ENCOUNTER — Ambulatory Visit: Payer: Self-pay | Admitting: Clinical

## 2017-07-14 ENCOUNTER — Ambulatory Visit: Payer: Self-pay | Admitting: Family Medicine

## 2017-07-25 ENCOUNTER — Encounter: Payer: Self-pay | Admitting: Family

## 2017-07-25 ENCOUNTER — Ambulatory Visit (INDEPENDENT_AMBULATORY_CARE_PROVIDER_SITE_OTHER): Payer: Medicare Other | Admitting: Family

## 2017-07-25 VITALS — BP 102/70 | HR 80 | Temp 98.4°F | Resp 18 | Ht 68.0 in | Wt 190.0 lb

## 2017-07-25 DIAGNOSIS — F329 Major depressive disorder, single episode, unspecified: Secondary | ICD-10-CM | POA: Diagnosis not present

## 2017-07-25 DIAGNOSIS — E119 Type 2 diabetes mellitus without complications: Secondary | ICD-10-CM

## 2017-07-25 DIAGNOSIS — I10 Essential (primary) hypertension: Secondary | ICD-10-CM

## 2017-07-25 DIAGNOSIS — Z23 Encounter for immunization: Secondary | ICD-10-CM

## 2017-07-25 DIAGNOSIS — J209 Acute bronchitis, unspecified: Secondary | ICD-10-CM

## 2017-07-25 DIAGNOSIS — R350 Frequency of micturition: Secondary | ICD-10-CM

## 2017-07-25 DIAGNOSIS — N401 Enlarged prostate with lower urinary tract symptoms: Secondary | ICD-10-CM

## 2017-07-25 DIAGNOSIS — E785 Hyperlipidemia, unspecified: Secondary | ICD-10-CM | POA: Diagnosis not present

## 2017-07-25 DIAGNOSIS — F32A Depression, unspecified: Secondary | ICD-10-CM

## 2017-07-25 LAB — URINALYSIS, ROUTINE W REFLEX MICROSCOPIC
BILIRUBIN URINE: NEGATIVE
Hgb urine dipstick: NEGATIVE
KETONES UR: NEGATIVE
LEUKOCYTES UA: NEGATIVE
Nitrite: NEGATIVE
PH: 5.5 (ref 5.0–8.0)
RBC / HPF: NONE SEEN (ref 0–?)
TOTAL PROTEIN, URINE-UPE24: NEGATIVE
UROBILINOGEN UA: 0.2 (ref 0.0–1.0)
Urine Glucose: NEGATIVE
WBC, UA: NONE SEEN (ref 0–?)

## 2017-07-25 LAB — BASIC METABOLIC PANEL
BUN: 16 mg/dL (ref 6–23)
CALCIUM: 9.5 mg/dL (ref 8.4–10.5)
CO2: 27 meq/L (ref 19–32)
Chloride: 101 mEq/L (ref 96–112)
Creatinine, Ser: 0.8 mg/dL (ref 0.40–1.50)
GFR: 110.27 mL/min (ref >60.00)
GLUCOSE: 105 mg/dL — AB (ref 70–99)
POTASSIUM: 4 meq/L (ref 3.5–5.1)
SODIUM: 136 meq/L (ref 135–145)

## 2017-07-25 LAB — HEMOGLOBIN A1C: Hgb A1c MFr Bld: 5.7 % (ref 4.6–6.5)

## 2017-07-25 MED ORDER — PREDNISONE 10 MG PO TABS: ORAL_TABLET | ORAL | 0 refills | Status: DC

## 2017-07-25 MED ORDER — SERTRALINE HCL 100 MG PO TABS: 100.0000 mg | ORAL_TABLET | Freq: Every day | ORAL | 1 refills | Status: DC

## 2017-07-25 MED ORDER — FLUTICASONE-SALMETEROL 100-50 MCG/DOSE IN AEPB: 1.0000 | INHALATION_SPRAY | Freq: Two times a day (BID) | RESPIRATORY_TRACT | 3 refills | Status: DC

## 2017-07-25 MED ORDER — AZITHROMYCIN 250 MG PO TABS: ORAL_TABLET | ORAL | 0 refills | Status: DC

## 2017-07-25 NOTE — Patient Instructions (Addendum)
Please increase Zoloft 100 mg once daily. We will be contacted about your referral to urology for your prostate symptoms. For your asthma/bronchitis symptoms please begin Advair, prednisone taper, and Z-Pak. Please call if new or worsening symptoms or if symptoms are not improved in 3 days.

## 2017-07-25 NOTE — Addendum Note (Signed)
Addended by: Kelle Darting A on: 07/25/2017 08:59 AM   Modules accepted: Orders

## 2017-07-25 NOTE — Progress Notes (Signed)
Subjective:    Patient ID: Cody Raymond, male    DOB: 1971-05-02, 46 y.o.   MRN: [id number]  HPI  Mr. Treptow is a 46 yr old male who presents today for follow up.  Depression/anxiety-he is maintained on Zoloft 50 mg once daily.  He also uses clonazepam 1 mg as needed. Reports he is using 1-2 tabs daily.  This helps some with his anxiety symptoms. Continues ambien for sleep,  Wakes up a lot.     Hypertension- current blood pressure medications include metoprolol 50mg , lisinopril 10mg .    BP Readings from Last 3 Encounters:  07/25/17 102/70  06/15/17 119/77  06/08/17 100/63   DM2- maintained on metformin xr.   Lab Results  Component Value Date   HGBA1C 5.4 04/24/2017   Hyperlipidemia- maintained on fenofibrate, zetia and atorvastatin.  Lab Results  Component Value Date   CHOL 181 04/24/2017   HDL 41.10 04/24/2017   LDLCALC 122 (H) 04/17/2015   LDLDIRECT 94.0 04/24/2017   TRIG 260.0 (H) 04/24/2017   CHOLHDL 4 04/24/2017    BPH- maintained on flomax. Reports that he has some dysuria and urinary frequency.  Chest congestion/pain in the lungs, cough, started 1 week ago.  Reports that "when I take a deep breath I can't breath."  Using a nebulizer with brief improvement in his symptoms.   Review of Systems See HPI  Past Medical History:  Diagnosis Date  . Aneurysm ([place])   . Asthma   . Blood transfusion   . CAD (coronary artery disease), non obstructive on cath 2011 04/05/2012  . Coronary artery disease   . Diabetes mellitus   . Enlarged prostate   . GERD (gastroesophageal reflux disease)   . H/O syncope   . Heart disease   . History of repair of patent ductus arteriosus 07/23/2016  . Hyperlipidemia   . Hypertension   . 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 Refusal of blood transfusions as patient is Jehovah's Witness      Social History   Socioeconomic History  . Marital status: Married    Spouse name: Not on file  . Number of children: Not on file  .  Years of education: Not on file  . Highest education level: Not on file  Social Needs  . Financial resource strain: Not on file  . Food insecurity - worry: Not on file  . Food insecurity - inability: Not on file  . Transportation needs - medical: Not on file  . Transportation needs - non-medical: Not on file  Occupational History  . Not on file  Tobacco Use  . Smoking status: Never Smoker  . Smokeless tobacco: Never Used  Substance and Sexual Activity  . Alcohol use: No  . Drug use: No  . Sexual activity: Not on file  Other Topics Concern  . Not on file  Social History Narrative   Holter monitor 08/2010: PVCs and sinus tachy.   Sleep Study (02/2008): mild sleep apnea, no indication for CPAP.    Past Surgical History:  Procedure Laterality Date  . APPENDECTOMY    . BACK SURGERY    . CARDIAC CATHETERIZATION  2007   Clean Cardiac Cath ([place]), with RCA 30% narrowing likely due to catheter induced spasm in 09/02/10.  Marland Kitchen CARDIAC CATHETERIZATION  09/02/2010   mod. nonobstructive disease in the RCA and CX, tortuous LAD  . COLONOSCOPY W/ POLYPECTOMY  03/17/11   diminutive polyp  . LOOP RECORDER IMPLANT  04/06/2012  Reveal XT [id number]  . NM MYOCAR PERF WALL MOTION  01/25/2008   mild anteroapical wall ischemia  . PATENT DUCTUS ARTERIOUS REPAIR     at age 78    Family History  Problem Relation Age of Onset  . Colon cancer Paternal Grandfather   . Prostate cancer Paternal Grandfather   . Aneurysm Father   . Heart attack Father 54  . Coronary artery disease Father   . Colon polyps Mother   . Heart disease Mother   . Coronary artery disease Mother   . Migraines Mother   . Breast cancer Maternal Grandmother   . Colon cancer Paternal Uncle        x 2  . Stomach cancer Brother   . Liver disease Unknown        unsure who it was  . Allergies Daughter     Allergies  Allergen Reactions  . Benadryl [Diphenhydramine Hcl]     "drives me nuts"  . Red Blood Cells     PT.  REFUSES ANY BLOOD PRODUCTS - JEHOVAH'S WITNESS.    Current Outpatient Medications on File Prior to Visit  Medication Sig Dispense Refill  . albuterol (PROVENTIL) (2.5 MG/3ML) 0.083% nebulizer solution USE 1 VIAL VIA NEBULIZER  EVERY 6 HOURS AS NEEDED FOR WHEEZING OR SHORTNESS OF  BREATH 600 mL 1  . aspirin 325 MG tablet Take 325 mg by mouth daily.    Marland Kitchen atorvastatin (LIPITOR) 80 MG tablet Take 1 tablet (80 mg total) by mouth daily. 90 tablet 1  . budesonide-formoterol (SYMBICORT) 160-4.5 MCG/ACT inhaler Inhale 2 puffs into the lungs 2 (two) times daily. 1 Inhaler 3  . cetirizine (ZYRTEC) 10 MG tablet Take 10 mg by mouth daily.    . clonazePAM (KLONOPIN) 1 MG tablet Take 1 tablet (1 mg total) by mouth daily. 90 tablet 0  . diclofenac sodium (VOLTAREN) 1 % GEL Apply 2 g topically 4 (four) times daily as needed (pain). 720 g 0  . ezetimibe (ZETIA) 10 MG tablet Take 1 tablet (10 mg total) by mouth daily. 90 tablet 1  . fenofibrate (TRICOR) 145 MG tablet Take 1 tablet by mouth  daily 90 tablet 1  . fish oil-omega-3 fatty acids 1000 MG capsule Take 2 g by mouth daily.     . Fluticasone-Salmeterol (ADVAIR) 100-50 MCG/DOSE AEPB Inhale 1 puff into the lungs 2 (two) times daily. 1 each 3  . gabapentin (NEURONTIN) 300 MG capsule TAKE 1 CAPSULE BY MOUTH AT  6AM, 11:30AM AND 4:30PM AND TAKE 3 CAPSULES AT 10:00PM 540 capsule 1  . isosorbide mononitrate (IMDUR) 30 MG 24 hr tablet Take 1 tablet (30 mg total) by mouth daily. 30 tablet 0  . ketoconazole (NIZORAL) 2 % shampoo APPLY TOPICALLY DAILY 360 mL 1  . lansoprazole (PREVACID) 15 MG capsule Take 15 mg by mouth daily.    Marland Kitchen lisinopril (PRINIVIL,ZESTRIL) 10 MG tablet TAKE 1 TABLET BY MOUTH  DAILY 90 tablet 1  . loperamide (IMODIUM) 2 MG capsule Take 1 capsule (2 mg total) by mouth as needed for diarrhea or loose stools. 12 capsule 0  . meloxicam (MOBIC) 7.5 MG tablet TAKE 1 TABLET BY MOUTH  DAILY 90 tablet 0  . metFORMIN (GLUCOPHAGE-XR) 500 MG 24 hr tablet  Take 2 tablets (1,000 mg total) by mouth daily with breakfast. 180 tablet 1  . metoprolol (LOPRESSOR) 50 MG tablet Take 1 tablet (50 mg total) by mouth once as directed. 1 tablet 0  . montelukast (SINGULAIR) 10 MG  tablet Take 1 tablet (10 mg total) by mouth at bedtime. 90 tablet 1  . Multiple Vitamin (MULTIVITAMIN WITH MINERALS) TABS tablet Take 1 tablet by mouth daily.    . naproxen sodium (ALEVE) 220 MG tablet Take 440 mg by mouth 2 (two) times daily. Take 440 mg in the morning and 220 mg in the evening.    . nitroGLYCERIN (NITROSTAT) 0.4 MG SL tablet Place 1 tablet (0.4 mg total) under the tongue every 5 (five) minutes as needed for chest pain. 30 tablet 1  . ondansetron (ZOFRAN ODT) 4 MG disintegrating tablet Take 1 tablet (4 mg total) by mouth every 8 (eight) hours as needed for nausea or vomiting. 20 tablet 0  . ONETOUCH VERIO test strip CHECK BLOOD SUGAR 3 TIMES  DAILY 300 each 1  . ramelteon (ROZEREM) 8 MG tablet Take 1 tablet (8 mg total) by mouth at bedtime. 30 tablet 5  . sertraline (ZOLOFT) 50 MG tablet TAKE 1 TABLET BY MOUTH  DAILY 90 tablet 1  . tamsulosin (FLOMAX) 0.4 MG CAPS capsule TAKE 1 CAPSULE BY MOUTH  DAILY 90 capsule 1  . tiZANidine (ZANAFLEX) 2 MG tablet TAKE 1 TABLET BY MOUTH  EVERY 6 HOURS AS NEEDED FOR MUSCLE SPASM(S) 270 tablet 2  . traMADol (ULTRAM) 50 MG tablet Take 1 tablet (50 mg total) by mouth every 6 (six) hours as needed for severe pain. 120 tablet 5  . traZODone (DESYREL) 100 MG tablet Take 1 tablet (100 mg total) by mouth at bedtime. 90 tablet 1  . zolpidem (AMBIEN) 10 MG tablet Take 1 tablet (10 mg total) by mouth at bedtime as needed for sleep. 90 tablet 0  . [DISCONTINUED] niacin (NIASPAN) 1000 MG CR tablet Take 1 tablet (1,000 mg total) by mouth at bedtime. 30 tablet 6   No current facility-administered medications on file prior to visit.     BP 102/70 (BP Location: Right Arm, Cuff Size: Large)   Pulse 80   Temp 98.4 F (36.9 C) (Oral)   Resp 18   Ht  5\' 8"  (1.727 m)   Wt 190 lb (86.2 kg)   SpO2 100%   BMI 28.89 kg/m       Objective:   Physical Exam  Constitutional: He is oriented to person, place, and time. He appears well-developed and well-nourished. No distress.  HENT:  Head: Normocephalic and atraumatic.  Cardiovascular: Normal rate and regular rhythm.  No murmur heard. Pulmonary/Chest: Effort normal. No respiratory distress. He has wheezes. He has no rales.  Musculoskeletal: He exhibits no edema.  Neurological: He is alert and oriented to person, place, and time.  Skin: Skin is warm and dry.  Psychiatric: He has a normal mood and affect. His behavior is normal. Thought content normal.          Assessment & Plan:  Bronchitis with bronchospasm-we will begin prednisone taper, Advair, and treat with a azithromycin.  Depression-uncontrolled.  Will increase Zoloft from 50 mg to 100 mg.  He scored 12 today on his PHQ 9.  Hypertension- stable on current meds continue same.  Diabetes type 2-will obtain follow-up A1c.  Consider discontinuation of metformin less than 6.  Hyperlipidemia- LDL at goal continue current meds.  BPH-we will refer to urology for further evaluation.  We will also check urinalysis and culture today to rule out infection.  Continue Flomax.  Flu shot today.

## 2017-07-27 LAB — URINE CULTURE
MICRO NUMBER: 81246549
SPECIMEN QUALITY:: ADEQUATE

## 2017-08-19 ENCOUNTER — Other Ambulatory Visit: Payer: Self-pay | Admitting: Family

## 2017-08-29 ENCOUNTER — Other Ambulatory Visit: Payer: Self-pay | Admitting: Family

## 2017-09-01 ENCOUNTER — Other Ambulatory Visit: Payer: Self-pay | Admitting: Family

## 2017-09-01 MED ORDER — ATORVASTATIN CALCIUM 80 MG PO TABS: 80.0000 mg | ORAL_TABLET | Freq: Every day | ORAL | 1 refills | Status: DC

## 2017-09-01 NOTE — Addendum Note (Signed)
Addended by: Kelle Darting A on: 09/01/2017 02:16 PM   Modules accepted: Orders

## 2017-09-01 NOTE — Telephone Encounter (Signed)
According to our records, refills should still be good until 1st of February. Spoke with Sherlynn Stalls at [facility] and verified that these requests are in anticipation of next refill when current refills will be expired. These will be placed on hold.

## 2017-09-01 NOTE — Telephone Encounter (Signed)
Received fax requesting refill of lipitor for auto refill cycle. Refills sent.

## 2017-09-29 ENCOUNTER — Other Ambulatory Visit: Payer: Medicare Other

## 2017-09-29 ENCOUNTER — Other Ambulatory Visit: Payer: Self-pay | Admitting: Physical Medicine & Rehabilitation

## 2017-09-29 ENCOUNTER — Other Ambulatory Visit: Payer: Self-pay | Admitting: Family

## 2017-09-29 DIAGNOSIS — Z79899 Other long term (current) drug therapy: Secondary | ICD-10-CM

## 2017-09-29 NOTE — Addendum Note (Signed)
Addended by: Kelle Darting A on: 09/29/2017 03:00 PM   Modules accepted: Orders

## 2017-09-29 NOTE — Telephone Encounter (Signed)
Recieved electronic medication refill request for tizanidine, not mentioned in last note to continue this medication.   Also last time patient was prescribed this medication was in march 2018 with only 2 refills.  Also patient not seen since July of 2018 and is on 6 month follow up.  Please advise

## 2017-09-29 NOTE — Telephone Encounter (Signed)
Last CSC was dated 2016. Printed contract and placed at front desk for pt to complete when he provides UDS. Request is from mail order so Rxs have been faxed. Pt states he will be able to come by on Monday to complete contract and UDS.

## 2017-09-29 NOTE — Telephone Encounter (Signed)
See printed rx's for ambien/klonopin. Pt needs to provide uds and can pick up at the front desk. Please check that controlled substance contract is up to date as well.

## 2017-10-02 ENCOUNTER — Telehealth: Payer: Self-pay | Admitting: *Deleted

## 2017-10-02 NOTE — Telephone Encounter (Signed)
Received PA request via covermymeds for Diclofenac Gel. KEY:  [id number]. PA completed. Awaiting determination.

## 2017-10-04 MED ORDER — DICLOFENAC SODIUM 1 % TD GEL: 2.0000 g | Freq: Four times a day (QID) | TRANSDERMAL | 0 refills | Status: DC | PRN

## 2017-10-04 NOTE — Telephone Encounter (Signed)
Received approval via covermymeds for diclofenac gel 1% through 09/18/18.

## 2017-10-04 NOTE — Telephone Encounter (Signed)
Attempted to completed PA via covermymeds and claim was run as brand, Voltaren. Spoke with CSR on Monday that stated we would have to proceed with a PA for the generic. Spoke with pharmacist at [facility] and he ran a claim for the generic that rejected in his system and he stated that would generate a PA on my end. I have not received anything via fax or covermymeds. Sent refill to [facility] today and re-generated PA request via covermymeds. Awaiting review which may take up to 72 hours. [place]: [id number].  Awaiting response.

## 2017-10-06 ENCOUNTER — Ambulatory Visit: Payer: Self-pay | Admitting: Family

## 2017-10-06 LAB — PAIN MGMT, PROFILE 8 W/CONF, U
6 Acetylmorphine: NEGATIVE ng/mL (ref <10)
ALCOHOL METABOLITES: NEGATIVE ng/mL (ref <500)
ALPHAHYDROXYMIDAZOLAM: NEGATIVE ng/mL (ref <50)
AMINOCLONAZEPAM: 28 ng/mL — AB (ref <25)
Alphahydroxyalprazolam: NEGATIVE ng/mL (ref <25)
Alphahydroxytriazolam: NEGATIVE ng/mL (ref <50)
Amphetamines: NEGATIVE ng/mL (ref <500)
BENZODIAZEPINES: POSITIVE ng/mL — AB (ref <100)
BUPRENORPHINE, URINE: NEGATIVE ng/mL (ref <5)
BUPRENORPHINE: NEGATIVE ng/mL (ref <2)
COCAINE METABOLITE: NEGATIVE ng/mL (ref <150)
Creatinine: 275.3 mg/dL
HYDROXYETHYLFLURAZEPAM: NEGATIVE ng/mL (ref <50)
LORAZEPAM: NEGATIVE ng/mL (ref <50)
MARIJUANA METABOLITE: NEGATIVE ng/mL (ref <20)
MDMA: NEGATIVE ng/mL (ref <500)
Norbuprenorphine: NEGATIVE ng/mL (ref <2)
Nordiazepam: NEGATIVE ng/mL (ref <50)
OXYCODONE: NEGATIVE ng/mL (ref <100)
Opiates: NEGATIVE ng/mL (ref <100)
Oxazepam: NEGATIVE ng/mL (ref <50)
Oxidant: NEGATIVE ug/mL (ref <200)
TEMAZEPAM: NEGATIVE ng/mL (ref <50)
pH: 5.73 (ref 4.5–9.0)

## 2017-10-10 NOTE — Telephone Encounter (Signed)
Received fax stating PA is needed for Diclofenac. Called [facility] and spoke with CSR, Cratia and confirmed that PA has been obtained and is awaiting processing with pt.

## 2017-10-11 ENCOUNTER — Ambulatory Visit (INDEPENDENT_AMBULATORY_CARE_PROVIDER_SITE_OTHER): Payer: Medicare Other | Admitting: Family

## 2017-10-11 ENCOUNTER — Encounter: Payer: Self-pay | Admitting: Family

## 2017-10-11 VITALS — BP 102/72 | HR 77 | Temp 98.3°F | Resp 16 | Ht 68.0 in | Wt 191.0 lb

## 2017-10-11 DIAGNOSIS — M797 Fibromyalgia: Secondary | ICD-10-CM | POA: Diagnosis not present

## 2017-10-11 DIAGNOSIS — F329 Major depressive disorder, single episode, unspecified: Secondary | ICD-10-CM

## 2017-10-11 DIAGNOSIS — F32A Depression, unspecified: Secondary | ICD-10-CM

## 2017-10-11 DIAGNOSIS — N400 Enlarged prostate without lower urinary tract symptoms: Secondary | ICD-10-CM

## 2017-10-11 NOTE — Patient Instructions (Signed)
Please continue current dose of meds.

## 2017-10-11 NOTE — Progress Notes (Signed)
Subjective:    Patient ID: Cody Raymond, male    DOB: 1971-03-07, 47 y.o.   MRN: [id number]  HPI  Patient is a 47 year old male who presents today for follow-up.  Depression/anxiety- last visit he noted worsening depression symptoms.  We increased his Zoloft from 50 mg to 100 mg.  He continues clonazepam 1 mg as needed.  He continues Ambien for sleep.  He reports that since increasing his Zoloft his mood is improved.  He is feeling better.  BPH-last visit we referred him to urology for further evaluation due to symptoms.  He was continued on Flomax. Reports good report from urology.    Notes AM bilateral shoulder pain, "I think its my fibromyalgia."  Denies shoulder injury.  Reports pain improves after he has been up for a while.    Review of Systems See HPI  Past Medical History:  Diagnosis Date  . Aneurysm ([place])   . Asthma   . Blood transfusion   . CAD (coronary artery disease), non obstructive on cath 2011 04/05/2012  . Coronary artery disease   . Diabetes mellitus   . Enlarged prostate   . GERD (gastroesophageal reflux disease)   . H/O syncope   . Heart disease   . History of repair of patent ductus arteriosus 07/23/2016  . Hyperlipidemia   . Hypertension   . 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 Refusal of blood transfusions as patient is Jehovah's Witness      Social History   Socioeconomic History  . Marital status: Married    Spouse name: Not on file  . Number of children: Not on file  . Years of education: Not on file  . Highest education level: Not on file  Social Needs  . Financial resource strain: Not on file  . Food insecurity - worry: Not on file  . Food insecurity - inability: Not on file  . Transportation needs - medical: Not on file  . Transportation needs - non-medical: Not on file  Occupational History  . Not on file  Tobacco Use  . Smoking status: Never Smoker  . Smokeless tobacco: Never Used  Substance and Sexual Activity  . Alcohol  use: No  . Drug use: No  . Sexual activity: Not on file  Other Topics Concern  . Not on file  Social History Narrative   Holter monitor 08/2010: PVCs and sinus tachy.   Sleep Study (02/2008): mild sleep apnea, no indication for CPAP.    Past Surgical History:  Procedure Laterality Date  . APPENDECTOMY    . BACK SURGERY    . CARDIAC CATHETERIZATION  2007   Clean Cardiac Cath ([place]), with RCA 30% narrowing likely due to catheter induced spasm in 09/02/10.  Marland Kitchen CARDIAC CATHETERIZATION  09/02/2010   mod. nonobstructive disease in the RCA and CX, tortuous LAD  . COLONOSCOPY W/ POLYPECTOMY  03/17/11   diminutive polyp  . LOOP RECORDER EXPLANT N/A 01/14/2014   Procedure: LOOP RECORDER EXPLANT;  Surgeon: Sanda Klein, MD;  Location: [facility] CATH [facility];  Service: Cardiovascular;  Laterality: N/A;  . LOOP RECORDER IMPLANT  04/06/2012   Reveal XT [id number]  . LOOP RECORDER IMPLANT N/A 04/06/2012   Procedure: LOOP RECORDER IMPLANT;  Surgeon: Sanda Klein, MD;  Location: [place] C[facility];  Service: Cardiovascular;  Laterality: N/A;  . NM MYOCAR PERF WALL MOTION  01/25/2008   mild anteroapical wall ischemia  . PATENT DUCTUS ARTERIOUS REPAIR     at age 1  Family History  Problem Relation Age of Onset  . Colon cancer Paternal Grandfather   . Prostate cancer Paternal Grandfather   . Aneurysm Father   . Heart attack Father 46  . Coronary artery disease Father   . Colon polyps Mother   . Heart disease Mother   . Coronary artery disease Mother   . Migraines Mother   . Breast cancer Maternal Grandmother   . Colon cancer Paternal Uncle        x 2  . Stomach cancer Brother   . Liver disease Unknown        unsure who it was  . Allergies Daughter     Allergies  Allergen Reactions  . Benadryl [Diphenhydramine Hcl]     "drives me nuts"  . Red Blood Cells     PT. REFUSES ANY BLOOD PRODUCTS - JEHOVAH'S WITNESS.    Current Outpatient Medications on File Prior to Visit  Medication Sig  Dispense Refill  . albuterol (PROVENTIL) (2.5 MG/3ML) 0.083% nebulizer solution USE 1 VIAL VIA NEBULIZER  EVERY 6 HOURS AS NEEDED FOR WHEEZING OR SHORTNESS OF  BREATH 75 mL 3  . aspirin 325 MG tablet Take 325 mg by mouth daily.    Marland Kitchen atorvastatin (LIPITOR) 80 MG tablet Take 1 tablet (80 mg total) by mouth daily. 90 tablet 1  . azithromycin (ZITHROMAX Z-PAK) 250 MG tablet 2 tabs by mouth today then one tab once daily for 4 more days 6 tablet 0  . budesonide-formoterol (SYMBICORT) 160-4.5 MCG/ACT inhaler Inhale 2 puffs into the lungs 2 (two) times daily. 1 Inhaler 3  . cetirizine (ZYRTEC) 10 MG tablet Take 10 mg by mouth daily.    . clonazePAM (KLONOPIN) 1 MG tablet TAKE 1 TABLET BY MOUTH  DAILY 90 tablet 0  . diclofenac sodium (VOLTAREN) 1 % GEL Apply 2 g topically 4 (four) times daily as needed (pain). 720 g 0  . ezetimibe (ZETIA) 10 MG tablet TAKE 1 TABLET BY MOUTH  DAILY 90 tablet 1  . fenofibrate (TRICOR) 145 MG tablet Take 1 tablet by mouth  daily 90 tablet 1  . fish oil-omega-3 fatty acids 1000 MG capsule Take 2 g by mouth daily.     . Fluticasone-Salmeterol (ADVAIR) 100-50 MCG/DOSE AEPB Inhale 1 puff 2 (two) times daily into the lungs. 1 each 3  . gabapentin (NEURONTIN) 300 MG capsule TAKE 1 CAPSULE BY MOUTH AT  6AM, 11:30AM AND 4:30PM AND TAKE 3 CAPSULES AT 10:00PM 540 capsule 1  . isosorbide mononitrate (IMDUR) 30 MG 24 hr tablet Take 1 tablet (30 mg total) by mouth daily. 30 tablet 0  . ketoconazole (NIZORAL) 2 % shampoo APPLY TOPICALLY DAILY 360 mL 1  . lansoprazole (PREVACID) 15 MG capsule Take 15 mg by mouth daily.    Marland Kitchen lisinopril (PRINIVIL,ZESTRIL) 10 MG tablet TAKE 1 TABLET BY MOUTH  DAILY 90 tablet 1  . loperamide (IMODIUM) 2 MG capsule Take 1 capsule (2 mg total) by mouth as needed for diarrhea or loose stools. 12 capsule 0  . meloxicam (MOBIC) 7.5 MG tablet TAKE 1 TABLET BY MOUTH  DAILY 90 tablet 0  . metFORMIN (GLUCOPHAGE-XR) 500 MG 24 hr tablet TAKE 2 TABLETS BY MOUTH  DAILY  WITH BREAKFAST 180 tablet 1  . metoprolol (LOPRESSOR) 50 MG tablet Take 1 tablet (50 mg total) by mouth once as directed. 1 tablet 0  . montelukast (SINGULAIR) 10 MG tablet TAKE 1 TABLET BY MOUTH AT  BEDTIME 90 tablet 1  . Multiple  Vitamin (MULTIVITAMIN WITH MINERALS) TABS tablet Take 1 tablet by mouth daily.    . naproxen sodium (ALEVE) 220 MG tablet Take 440 mg by mouth 2 (two) times daily. Take 440 mg in the morning and 220 mg in the evening.    . nitroGLYCERIN (NITROSTAT) 0.4 MG SL tablet Place 1 tablet (0.4 mg total) under the tongue every 5 (five) minutes as needed for chest pain. 30 tablet 1  . ondansetron (ZOFRAN ODT) 4 MG disintegrating tablet Take 1 tablet (4 mg total) by mouth every 8 (eight) hours as needed for nausea or vomiting. 20 tablet 0  . ONETOUCH VERIO test strip CHECK BLOOD SUGAR 3 TIMES  DAILY 300 each 1  . ramelteon (ROZEREM) 8 MG tablet Take 1 tablet (8 mg total) by mouth at bedtime. 30 tablet 5  . sertraline (ZOLOFT) 100 MG tablet Take 1 tablet (100 mg total) daily by mouth. 90 tablet 1  . tamsulosin (FLOMAX) 0.4 MG CAPS capsule TAKE 1 CAPSULE BY MOUTH  DAILY 90 capsule 1  . tiZANidine (ZANAFLEX) 2 MG tablet TAKE 1 TABLET BY MOUTH  EVERY 6 HOURS AS NEEDED FOR MUSCLE SPASM(S) 270 tablet 2  . traMADol (ULTRAM) 50 MG tablet Take 1 tablet (50 mg total) by mouth every 6 (six) hours as needed for severe pain. 120 tablet 5  . traZODone (DESYREL) 100 MG tablet TAKE 1 TABLET BY MOUTH AT  BEDTIME 90 tablet 1  . zolpidem (AMBIEN) 10 MG tablet TAKE 1 TABLET BY MOUTH AT  BEDTIME AS NEEDED FOR SLEEP 90 tablet 0  . [DISCONTINUED] niacin (NIASPAN) 1000 MG CR tablet Take 1 tablet (1,000 mg total) by mouth at bedtime. 30 tablet 6   No current facility-administered medications on file prior to visit.     BP 102/72 (BP Location: Right Arm, Patient Position: Sitting, Cuff Size: Small)   Pulse 77   Temp 98.3 F (36.8 C) (Oral)   Resp 16   Ht 5\' 8"  (1.727 m)   Wt 191 lb (86.6 kg)   SpO2  99%   BMI 29.04 kg/m       Objective:   Physical Exam  Constitutional: He is oriented to person, place, and time. He appears well-developed and well-nourished. No distress.  HENT:  Head: Normocephalic and atraumatic.  Cardiovascular: Normal rate and regular rhythm.  No murmur heard. Pulmonary/Chest: Effort normal and breath sounds normal. No respiratory distress. He has no wheezes. He has no rales.  Musculoskeletal: He exhibits no edema.  Neurological: He is alert and oriented to person, place, and time.  Skin: Skin is warm and dry.  Psychiatric: He has a normal mood and affect. His behavior is normal. Thought content normal.          Assessment & Plan:  Depression-stable/improved continue current dose of Zoloft.  BPH-stable on Flomax.  Continue same.  Management per urology.  Fibromyalgia- stable continue current meds.

## 2017-10-19 ENCOUNTER — Encounter: Payer: Medicare Other | Attending: Physical Medicine & Rehabilitation

## 2017-10-19 ENCOUNTER — Other Ambulatory Visit: Payer: Self-pay | Admitting: Family

## 2017-10-19 ENCOUNTER — Other Ambulatory Visit: Payer: Self-pay | Admitting: Physical Medicine & Rehabilitation

## 2017-10-19 ENCOUNTER — Ambulatory Visit: Payer: Medicare Other | Admitting: Physical Medicine & Rehabilitation

## 2017-10-19 ENCOUNTER — Encounter: Payer: Self-pay | Admitting: Physical Medicine & Rehabilitation

## 2017-10-19 VITALS — BP 100/68 | HR 88

## 2017-10-19 DIAGNOSIS — Z8042 Family history of malignant neoplasm of prostate: Secondary | ICD-10-CM | POA: Insufficient documentation

## 2017-10-19 DIAGNOSIS — Z8489 Family history of other specified conditions: Secondary | ICD-10-CM | POA: Insufficient documentation

## 2017-10-19 DIAGNOSIS — M7661 Achilles tendinitis, right leg: Secondary | ICD-10-CM | POA: Insufficient documentation

## 2017-10-19 DIAGNOSIS — M797 Fibromyalgia: Secondary | ICD-10-CM | POA: Insufficient documentation

## 2017-10-19 DIAGNOSIS — I2510 Atherosclerotic heart disease of native coronary artery without angina pectoris: Secondary | ICD-10-CM | POA: Insufficient documentation

## 2017-10-19 DIAGNOSIS — Z9889 Other specified postprocedural states: Secondary | ICD-10-CM | POA: Diagnosis not present

## 2017-10-19 DIAGNOSIS — M5416 Radiculopathy, lumbar region: Secondary | ICD-10-CM | POA: Diagnosis not present

## 2017-10-19 DIAGNOSIS — E119 Type 2 diabetes mellitus without complications: Secondary | ICD-10-CM | POA: Diagnosis not present

## 2017-10-19 DIAGNOSIS — M961 Postlaminectomy syndrome, not elsewhere classified: Secondary | ICD-10-CM

## 2017-10-19 DIAGNOSIS — Z803 Family history of malignant neoplasm of breast: Secondary | ICD-10-CM | POA: Insufficient documentation

## 2017-10-19 DIAGNOSIS — I10 Essential (primary) hypertension: Secondary | ICD-10-CM | POA: Diagnosis not present

## 2017-10-19 DIAGNOSIS — Z8249 Family history of ischemic heart disease and other diseases of the circulatory system: Secondary | ICD-10-CM | POA: Insufficient documentation

## 2017-10-19 DIAGNOSIS — N400 Enlarged prostate without lower urinary tract symptoms: Secondary | ICD-10-CM | POA: Diagnosis not present

## 2017-10-19 DIAGNOSIS — K219 Gastro-esophageal reflux disease without esophagitis: Secondary | ICD-10-CM | POA: Insufficient documentation

## 2017-10-19 DIAGNOSIS — Z800 Family history of malignant neoplasm of digestive organs: Secondary | ICD-10-CM | POA: Insufficient documentation

## 2017-10-19 MED ORDER — TRAMADOL HCL 50 MG PO TABS: 50.0000 mg | ORAL_TABLET | Freq: Four times a day (QID) | ORAL | 5 refills | Status: DC | PRN

## 2017-10-19 NOTE — Patient Instructions (Signed)
Will review MRI at next visit 

## 2017-10-19 NOTE — Progress Notes (Signed)
Subjective:    Patient ID: Cody Raymond, male    DOB: April 17, 1971, 47 y.o.   MRN: [id number]  HPI 47 year old male with chronic low back pain he has undergone decompressive laminectomy and microdiscectomy and foraminotomies right L4-L5 in 2012 per Dr. Gladstone Lighter He has been placed on permanent restrictions by orthopedics no lifting greater than 5 pounds. He has difficult to manage diabetes.  Blood sugars ranging 150s to around 200. Other past medical history significant for fibromyalgia, right Achilles tendinitis,  Increased RLE weakness, has fallen a couple times.  He denies any increased numbness.  He states that her symptoms are intermittent.  He uses a cane to ambulate.  He did injure his right shoulder during a fall a few months ago and see sports medicine for this  He has undergone right L4 selective nerve root block 09/06/2013, good short-term improvement in pain down the right lower extremity, preinjection 8/10 post injection 0/10 Patient also has had injections for trochanteric bursitis of the hip Pain Inventory Average Pain 8 Pain Right Now 9 My pain is constant, sharp, burning, dull, stabbing, tingling and aching  In the last 24 hours, has pain interfered with the following? General activity 3 Relation with others 4 Enjoyment of life 4 What TIME of day is your pain at its worst? all Sleep (in general) Fair  Pain is worse with: walking, bending, sitting, inactivity and standing Pain improves with: rest, heat/ice, medication and injections Relief from Meds: .  Mobility use a cane ability to climb steps?  no do you drive?  yes  Function disabled: date disabled . I need assistance with the following:  bathing  Neuro/Psych weakness numbness tremor tingling spasms dizziness depression anxiety  Prior Studies x-rays CT/MRI  Physicians involved in your care Neurosurgeon . Orthopedist .   Family History  Problem Relation Age of Onset  . Colon cancer  Paternal Grandfather   . Prostate cancer Paternal Grandfather   . Aneurysm Father   . Heart attack Father 72  . Coronary artery disease Father   . Colon polyps Mother   . Heart disease Mother   . Coronary artery disease Mother   . Migraines Mother   . Breast cancer Maternal Grandmother   . Colon cancer Paternal Uncle        x 2  . Stomach cancer Brother   . Liver disease Unknown        unsure who it was  . Allergies Daughter    Social History   Socioeconomic History  . Marital status: Married    Spouse name: Not on file  . Number of children: Not on file  . Years of education: Not on file  . Highest education level: Not on file  Social Needs  . Financial resource strain: Not on file  . Food insecurity - worry: Not on file  . Food insecurity - inability: Not on file  . Transportation needs - medical: Not on file  . Transportation needs - non-medical: Not on file  Occupational History  . Not on file  Tobacco Use  . Smoking status: Never Smoker  . Smokeless tobacco: Never Used  Substance and Sexual Activity  . Alcohol use: No  . Drug use: No  . Sexual activity: Not on file  Other Topics Concern  . Not on file  Social History Narrative   Holter monitor 08/2010: PVCs and sinus tachy.   Sleep Study (02/2008): mild sleep apnea, no indication for CPAP.   Past Surgical History:  Procedure Laterality Date  . APPENDECTOMY    . BACK SURGERY    . CARDIAC CATHETERIZATION  2007   Clean Cardiac Cath ([place]), with RCA 30% narrowing likely due to catheter induced spasm in 09/02/10.  Marland Kitchen CARDIAC CATHETERIZATION  09/02/2010   mod. nonobstructive disease in the RCA and CX, tortuous LAD  . COLONOSCOPY W/ POLYPECTOMY  03/17/11   diminutive polyp  . LOOP RECORDER EXPLANT N/A 01/14/2014   Procedure: LOOP RECORDER EXPLANT;  Surgeon: Sanda Klein, MD;  Location: [facility];  Service: Cardiovascular;  Laterality: N/A;  . LOOP RECORDER IMPLANT  04/06/2012   Reveal XT 8127  .  LOOP RECORDER IMPLANT N/A 04/06/2012   Procedure: LOOP RECORDER IMPLANT;  Surgeon: Sanda Klein, MD;  Location: [facility];  Service: Cardiovascular;  Laterality: N/A;  . NM MYOCAR PERF WALL MOTION  01/25/2008   mild anteroapical wall ischemia  . PATENT DUCTUS ARTERIOUS REPAIR     at age 52   Past Medical History:  Diagnosis Date  . Aneurysm ([place])   . Asthma   . Blood transfusion   . CAD (coronary artery disease), non obstructive on cath 2011 04/05/2012  . Coronary artery disease   . Diabetes mellitus   . Enlarged prostate   . GERD (gastroesophageal reflux disease)   . H/O syncope   . Heart disease   . History of repair of patent ductus arteriosus 07/23/2016  . Hyperlipidemia   . Hypertension   . 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 Refusal of blood transfusions as patient is Jehovah's Witness    There were no vitals taken for this visit.  Opioid Risk Score:   Fall Risk Score:  `1  Depression screen PHQ 2/9  Depression screen [facility] 2/9 07/25/2017 06/08/2016 04/20/2016 12/25/2014  Decreased Interest 2 1 0 2  Down, Depressed, Hopeless 2 1 1 1   PHQ - 2 Score 4 2 1 3   Altered sleeping 3 3 - 1  Tired, decreased energy 2 1 - 1  Change in appetite 2 0 - 0  Feeling bad or failure about yourself  2 (No Data) - 2  Trouble concentrating 2 (No Data) - 2  Moving slowly or fidgety/restless 1 (No Data) - 2  Suicidal thoughts 0 0 - 0  PHQ-9 Score 16 6 - 11  Difficult doing work/chores - Somewhat difficult - -  Some recent data might be hidden     Review of Systems  Constitutional: Positive for chills, diaphoresis, fever and unexpected weight change.  HENT: Negative.   Eyes: Negative.   Respiratory: Positive for cough, shortness of breath and wheezing.   Cardiovascular: Negative.   Gastrointestinal: Positive for abdominal pain, diarrhea, nausea and vomiting.  Endocrine:       High sugar  Genitourinary: Positive for difficulty urinating.  Musculoskeletal: Negative.   Skin: Negative.    Allergic/Immunologic: Negative.   Neurological: Negative.   Hematological: Negative.   Psychiatric/Behavioral: Negative.   All other systems reviewed and are negative.      Objective:   Physical Exam  Constitutional: He is oriented to person, place, and time. He appears well-developed and well-nourished.  Neurological: He is alert and oriented to person, place, and time.  Psychiatric: He has a normal mood and affect.  Nursing note and vitals reviewed.  Motor strength is 4/5 right hip flexion knee extension 5/5 right ankle dorsiflexion 5/5 left hip flexion extension ankle wrist flexion there is some pain inhibition on the right side. There  is minimal tenderness palpation along the greater trochanters of the hip. There is mild tenderness palpation along the cervical thoracic and lumbar paraspinal muscle areas.  Patient states the most of his pain is in the lumbosacral area. Patient has 50% range flexion extension lateral bending and rotation of his lumbar spine with pain in each direction. Negative straight leg raising Ambulates with assistive device no evidence of toe drag or knee instability, four-point stand up cane Patient has absent right Achilles reflex, 1+ at the left Achilles 2+ bilateral patellar Decreased sensation right L5 and right L4 dermatome distribution normal pinprick sensation on the left side.      Assessment & Plan:  #1.  Lumbar postlaminectomy syndrome with chronic low back pain as well as chronic right L4 radiculitis.  He is experiencing increasing weakness on that side although this appears to be very episodic.  I do not see any progression of his weakness compared to prior exams however he has had a fall resulting in injury.  He does have some decreased sensation in the right L5 dermatome distribution in addition to the right L4.  This appears to be new. We will repeat lumbar MRI as his symptoms have been ongoing for at least 3 months since I last saw him.  He is  in agreement with this.  I will see him back in 1 month to follow-up on this, he is requesting open scanner.  We can call in Valium if needed.

## 2017-10-19 NOTE — Telephone Encounter (Signed)
Requesting: zolpidem Contract: 09/29/17 UDS: 04/25/17 Last Visit: 10/11/17 Next Visit: 01/10/18 Last Refill: 09/29/17 90 tablets  Please Advise  Requesting: clonazepam Contract: 09/29/17 UDS:04/25/17 Last Visit: 10/11/17 Next Visit: 01/10/18 Last Refill: 09/29/17 90 tablets   Please Advise  Patient has requested medications to early.

## 2017-10-30 ENCOUNTER — Ambulatory Visit
Admission: RE | Admit: 2017-10-30 | Discharge: 2017-10-30 | Disposition: A | Discharge status: Home / Self Care | Payer: Medicare Other | Source: Ambulatory Visit | Attending: Physical Medicine & Rehabilitation | Admitting: Physical Medicine & Rehabilitation

## 2017-10-30 DIAGNOSIS — M961 Postlaminectomy syndrome, not elsewhere classified: Secondary | ICD-10-CM

## 2017-11-07 ENCOUNTER — Other Ambulatory Visit: Payer: Self-pay | Admitting: Family

## 2017-11-07 ENCOUNTER — Other Ambulatory Visit: Payer: Self-pay | Admitting: Physical Medicine & Rehabilitation

## 2017-11-08 ENCOUNTER — Ambulatory Visit (INDEPENDENT_AMBULATORY_CARE_PROVIDER_SITE_OTHER): Payer: Medicare Other | Admitting: Internal Medicine

## 2017-11-08 ENCOUNTER — Encounter: Payer: Self-pay | Admitting: Internal Medicine

## 2017-11-08 VITALS — BP 128/64 | HR 68 | Temp 98.2°F | Resp 14 | Ht 68.0 in | Wt 191.1 lb

## 2017-11-08 DIAGNOSIS — J45901 Unspecified asthma with (acute) exacerbation: Secondary | ICD-10-CM

## 2017-11-08 MED ORDER — PREDNISONE 10 MG PO TABS: ORAL_TABLET | ORAL | 0 refills | Status: DC

## 2017-11-08 MED ORDER — AZITHROMYCIN 250 MG PO TABS: ORAL_TABLET | ORAL | 0 refills | Status: DC

## 2017-11-08 NOTE — Patient Instructions (Signed)
Rest, fluids , tylenol  For cough:  Take Mucinex DM twice a day as needed until better  For nasal congestion: Use OTC Nasocort or Flonase : 2 nasal sprays on each side of the nose in the morning until you feel better  Avoid decongestants such as  Pseudoephedrine or phenylephrine    Prednisone as prescribed  Stop prednisone and call if blood sugars > 250  Take the antibiotic as prescribed  (zithromax)  Call if not gradually better over the next  10 days  Call anytime if the symptoms are severe

## 2017-11-08 NOTE — Progress Notes (Signed)
Subjective:    Patient ID: Cody Raymond, male    DOB: 1971/02/26, 47 y.o.   MRN: [id number]  DOS:  11/08/2017 Type of visit - description : Acute visit Interval history:  Symptoms of started 3 days ago with runny nose, sinus and chest congestion, wheezing, + yellow sputum production and cough. He is taking his regular medications including nebulizations, had 2 today, still wheezing. Typically, steroids and an antibiotic help.  Review of Systems  Some shortness of breath, I asked about chest pain and he said "I always have a little bit".  Nothing unusual. No fever chills No nausea or vomiting No flulike aches or pains.  Past Medical History:  Diagnosis Date  . Aneurysm ([place])   . Asthma   . Blood transfusion   . CAD (coronary artery disease), non obstructive on cath 2011 04/05/2012  . Coronary artery disease   . Diabetes mellitus   . Enlarged prostate   . GERD (gastroesophageal reflux disease)   . H/O syncope   . Heart disease   . History of repair of patent ductus arteriosus 07/23/2016  . Hyperlipidemia   . Hypertension   . 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 Refusal of blood transfusions as patient is Jehovah's Witness     Past Surgical History:  Procedure Laterality Date  . APPENDECTOMY    . BACK SURGERY    . CARDIAC CATHETERIZATION  2007   Clean Cardiac Cath ([place]), with RCA 30% narrowing likely due to catheter induced spasm in 09/02/10.  Marland Kitchen CARDIAC CATHETERIZATION  09/02/2010   mod. nonobstructive disease in the RCA and CX, tortuous LAD  . COLONOSCOPY W/ POLYPECTOMY  03/17/11   diminutive polyp  . LOOP RECORDER EXPLANT N/A 01/14/2014   Procedure: LOOP RECORDER EXPLANT;  Surgeon: Sanda Klein, MD;  Location: [facility];  Service: Cardiovascular;  Laterality: N/A;  . LOOP RECORDER IMPLANT  04/06/2012   Reveal XT 3149  . LOOP RECORDER IMPLANT N/A 04/06/2012   Procedure: LOOP RECORDER IMPLANT;  Surgeon: Sanda Klein, MD;  Location: [facility];   Service: Cardiovascular;  Laterality: N/A;  . NM MYOCAR PERF WALL MOTION  01/25/2008   mild anteroapical wall ischemia  . PATENT DUCTUS ARTERIOUS REPAIR     at age 49    Social History   Socioeconomic History  . Marital status: Married    Spouse name: Not on file  . Number of children: Not on file  . Years of education: Not on file  . Highest education level: Not on file  Social Needs  . Financial resource strain: Not on file  . Food insecurity - worry: Not on file  . Food insecurity - inability: Not on file  . Transportation needs - medical: Not on file  . Transportation needs - non-medical: Not on file  Occupational History  . Not on file  Tobacco Use  . Smoking status: Never Smoker  . Smokeless tobacco: Never Used  Substance and Sexual Activity  . Alcohol use: No  . Drug use: No  . Sexual activity: Not on file  Other Topics Concern  . Not on file  Social History Narrative   Holter monitor 08/2010: PVCs and sinus tachy.   Sleep Study (02/2008): mild sleep apnea, no indication for CPAP.      Allergies as of 11/08/2017      Reactions   Benadryl [diphenhydramine Hcl]    "drives me nuts"      Medication List  Accurate as of 11/08/17  1:01 PM. Always use your most recent med list.          albuterol (2.5 MG/3ML) 0.083% nebulizer solution Commonly known as:  PROVENTIL USE 1 VIAL VIA NEBULIZER  EVERY 6 HOURS AS NEEDED FOR WHEEZING OR SHORTNESS OF  BREATH   aspirin 325 MG tablet Take 325 mg by mouth daily.   atorvastatin 80 MG tablet Commonly known as:  LIPITOR Take 1 tablet (80 mg total) by mouth daily.   budesonide-formoterol 160-4.5 MCG/ACT inhaler Commonly known as:  SYMBICORT Inhale 2 puffs into the lungs 2 (two) times daily.   cetirizine 10 MG tablet Commonly known as:  ZYRTEC Take 10 mg by mouth daily.   clonazePAM 1 MG tablet Commonly known as:  KLONOPIN TAKE 1 TABLET BY MOUTH  DAILY   diclofenac sodium 1 % Gel Commonly known as:   VOLTAREN Apply 2 g topically 4 (four) times daily as needed (pain).   ezetimibe 10 MG tablet Commonly known as:  ZETIA TAKE 1 TABLET BY MOUTH  DAILY   fenofibrate 145 MG tablet Commonly known as:  TRICOR Take 1 tablet by mouth  daily   fish oil-omega-3 fatty acids 1000 MG capsule Take 2 g by mouth daily.   Fluticasone-Salmeterol 100-50 MCG/DOSE Aepb Commonly known as:  ADVAIR Inhale 1 puff 2 (two) times daily into the lungs.   gabapentin 300 MG capsule Commonly known as:  NEURONTIN TAKE 1 CAPSULE BY MOUTH AT  6AM, 11:30AM AND 4:30PM AND TAKE 3 CAPSULES AT 10:00PM   isosorbide mononitrate 30 MG 24 hr tablet Commonly known as:  IMDUR Take 1 tablet (30 mg total) by mouth daily.   ketoconazole 2 % shampoo Commonly known as:  NIZORAL APPLY TOPICALLY DAILY   lansoprazole 15 MG capsule Commonly known as:  PREVACID Take 15 mg by mouth daily.   lisinopril 10 MG tablet Commonly known as:  PRINIVIL,ZESTRIL TAKE 1 TABLET BY MOUTH  DAILY   loperamide 2 MG capsule Commonly known as:  IMODIUM Take 1 capsule (2 mg total) by mouth as needed for diarrhea or loose stools.   meloxicam 7.5 MG tablet Commonly known as:  MOBIC TAKE 1 TABLET BY MOUTH  DAILY   metFORMIN 500 MG 24 hr tablet Commonly known as:  GLUCOPHAGE-XR TAKE 2 TABLETS BY MOUTH  DAILY WITH BREAKFAST   metoprolol tartrate 50 MG tablet Commonly known as:  LOPRESSOR Take 1 tablet (50 mg total) by mouth once as directed.   montelukast 10 MG tablet Commonly known as:  SINGULAIR TAKE 1 TABLET BY MOUTH AT  BEDTIME   multivitamin with minerals Tabs tablet Take 1 tablet by mouth daily.   nitroGLYCERIN 0.4 MG SL tablet Commonly known as:  NITROSTAT Place 1 tablet (0.4 mg total) under the tongue every 5 (five) minutes as needed for chest pain.   ONETOUCH VERIO test strip Generic drug:  glucose blood CHECK BLOOD SUGAR 3 TIMES  DAILY   ramelteon 8 MG tablet Commonly known as:  ROZEREM Take 1 tablet (8 mg total) by  mouth at bedtime.   sertraline 100 MG tablet Commonly known as:  ZOLOFT Take 1 tablet (100 mg total) daily by mouth.   tamsulosin 0.4 MG Caps capsule Commonly known as:  FLOMAX TAKE 1 CAPSULE BY MOUTH  DAILY   tiZANidine 2 MG tablet Commonly known as:  ZANAFLEX TAKE 1 TABLET BY MOUTH  EVERY 6 HOURS AS NEEDED FOR MUSCLE SPASM(S)   traMADol 50 MG tablet Commonly known as:  ULTRAM TAKE 1 TABLET BY MOUTH  EVERY 6 HOURS AS NEEDED FOR SEVERE PAIN   traZODone 100 MG tablet Commonly known as:  DESYREL TAKE 1 TABLET BY MOUTH AT  BEDTIME   zolpidem 10 MG tablet Commonly known as:  AMBIEN TAKE 1 TABLET BY MOUTH AT  BEDTIME AS NEEDED FOR SLEEP          Objective:   Physical Exam BP 128/64 (BP Location: Left Arm, Patient Position: Sitting, Cuff Size: Small)   Pulse 68   Temp 98.2 F (36.8 C) (Oral)   Resp 14   Ht 5\' 8"  (1.727 m)   Wt 191 lb 2 oz (86.7 kg)   SpO2 98%   BMI 29.06 kg/m  General:   Well developed, well nourished . NAD.  HEENT:  Normocephalic . Face symmetric, atraumatic. TMs normal, nose congested, sinuses no TTP. Lungs:  + Wheezing bilaterally, mild to moderate.  No increased work of breathing, no rhonchi or crackles Normal respiratory effort, no intercostal retractions, no accessory muscle use. Heart: RRR,  no murmur.  No pretibial edema bilaterally  Skin: Not pale. Not jaundice Neurologic:  alert & oriented X3.  Speech normal, gait appropriate for age and unassisted Psych--  Cognition and judgment appear intact.  Cooperative with normal attention span and concentration.  Behavior appropriate. No anxious or depressed appearing.      Assessment & Plan:    47 y/o with multiple medical problems including diabetes, last A1c below 6 presents with  Asthma exacerbation: Due to a viral infection or bronchitis.  Historically, responded very well to prednisone and antibiotic. Plan: Continue routine meds including Symbicort or Advair (he does not take them  together), add prednisone and Zithromax.  Call if no better

## 2017-11-08 NOTE — Progress Notes (Signed)
Pre visit review using our clinic review tool, if applicable. No additional management support is needed unless otherwise documented below in the visit note. 

## 2017-11-14 ENCOUNTER — Encounter: Payer: Medicare Other | Attending: Physical Medicine & Rehabilitation

## 2017-11-14 ENCOUNTER — Ambulatory Visit: Payer: Medicare Other | Admitting: Physical Medicine & Rehabilitation

## 2017-11-14 ENCOUNTER — Encounter: Payer: Self-pay | Admitting: Physical Medicine & Rehabilitation

## 2017-11-14 VITALS — BP 120/75 | HR 93 | Resp 14

## 2017-11-14 DIAGNOSIS — N400 Enlarged prostate without lower urinary tract symptoms: Secondary | ICD-10-CM | POA: Insufficient documentation

## 2017-11-14 DIAGNOSIS — I10 Essential (primary) hypertension: Secondary | ICD-10-CM | POA: Diagnosis not present

## 2017-11-14 DIAGNOSIS — M797 Fibromyalgia: Secondary | ICD-10-CM | POA: Diagnosis not present

## 2017-11-14 DIAGNOSIS — K219 Gastro-esophageal reflux disease without esophagitis: Secondary | ICD-10-CM | POA: Insufficient documentation

## 2017-11-14 DIAGNOSIS — Z8249 Family history of ischemic heart disease and other diseases of the circulatory system: Secondary | ICD-10-CM | POA: Diagnosis not present

## 2017-11-14 DIAGNOSIS — M961 Postlaminectomy syndrome, not elsewhere classified: Secondary | ICD-10-CM | POA: Diagnosis present

## 2017-11-14 DIAGNOSIS — E119 Type 2 diabetes mellitus without complications: Secondary | ICD-10-CM | POA: Insufficient documentation

## 2017-11-14 DIAGNOSIS — Z803 Family history of malignant neoplasm of breast: Secondary | ICD-10-CM | POA: Insufficient documentation

## 2017-11-14 DIAGNOSIS — Z8042 Family history of malignant neoplasm of prostate: Secondary | ICD-10-CM | POA: Insufficient documentation

## 2017-11-14 DIAGNOSIS — M5416 Radiculopathy, lumbar region: Secondary | ICD-10-CM | POA: Diagnosis not present

## 2017-11-14 DIAGNOSIS — Z800 Family history of malignant neoplasm of digestive organs: Secondary | ICD-10-CM | POA: Diagnosis not present

## 2017-11-14 DIAGNOSIS — M4716 Other spondylosis with myelopathy, lumbar region: Secondary | ICD-10-CM | POA: Diagnosis not present

## 2017-11-14 DIAGNOSIS — M7661 Achilles tendinitis, right leg: Secondary | ICD-10-CM | POA: Diagnosis not present

## 2017-11-14 DIAGNOSIS — Z8489 Family history of other specified conditions: Secondary | ICD-10-CM | POA: Diagnosis not present

## 2017-11-14 DIAGNOSIS — E1142 Type 2 diabetes mellitus with diabetic polyneuropathy: Secondary | ICD-10-CM

## 2017-11-14 DIAGNOSIS — I2510 Atherosclerotic heart disease of native coronary artery without angina pectoris: Secondary | ICD-10-CM | POA: Diagnosis not present

## 2017-11-14 DIAGNOSIS — Z9889 Other specified postprocedural states: Secondary | ICD-10-CM | POA: Insufficient documentation

## 2017-11-14 NOTE — Progress Notes (Signed)
Subjective:    Patient ID: Cody Raymond, male    DOB: [birth year], 47 y.o.   MRN: [id number]  06/03/2011 Decompressive laminectomy as well as microdiscectomy and foraminotomies right L4-L5. Had preoperative ankle dorsiflexor and toe extensor weakness. Dr Gladstone Lighter and [place] Patient was given permanent restrictions by orthopedics no lifting greater than 5 pounds no stooping or bending He has undergone right L4 selective nerve root block 09/06/2013, good short-term improvement in pain down the right lower extremity, preinjection 8/10 post injection 0/10 HPI  Patient has had continued symptoms down the right leg.  He is here to discuss his repeat MRI results.  We looked at his MRI both in sagittal and axial imaging. His pain does radiate to his right ankle primarily He has some lumpiness and tingling in both feet however he does have a history of diabetes  Pain Inventory Average Pain 9 Pain Right Now 9 My pain is constant, sharp, burning, dull, stabbing, tingling and aching  In the last 24 hours, has pain interfered with the following? General activity 4 Relation with others 4 Enjoyment of life 4 What TIME of day is your pain at its worst? all Sleep (in general) Fair  Pain is worse with: walking, bending, sitting and standing Pain improves with: rest, heat/ice and medication Relief from Meds: 3  Mobility walk with assistance use a cane how many minutes can you walk? 15 ability to climb steps?  no do you drive?  yes Do you have any goals in this area?  yes  Function disabled: date disabled . I need assistance with the following:  bathing  Neuro/Psych weakness numbness tremor tingling spasms dizziness confusion depression anxiety  Prior Studies Any changes since last visit?  yes bone scan x-rays CT/MRI nerve study  Physicians involved in your care Any changes since last visit?  yes   Family History  Problem Relation Age of Onset  . Colon cancer  Paternal Grandfather   . Prostate cancer Paternal Grandfather   . Aneurysm Father   . Heart attack Father 16  . Coronary artery disease Father   . Colon polyps Mother   . Heart disease Mother   . Coronary artery disease Mother   . Migraines Mother   . Breast cancer Maternal Grandmother   . Colon cancer Paternal Uncle        x 2  . Stomach cancer Brother   . Liver disease Unknown        unsure who it was  . Allergies Daughter    Social History   Socioeconomic History  . Marital status: Married    Spouse name: None  . Number of children: None  . Years of education: None  . Highest education level: None  Social Needs  . Financial resource strain: None  . Food insecurity - worry: None  . Food insecurity - inability: None  . Transportation needs - medical: None  . Transportation needs - non-medical: None  Occupational History  . None  Tobacco Use  . Smoking status: Never Smoker  . Smokeless tobacco: Never Used  Substance and Sexual Activity  . Alcohol use: No  . Drug use: No  . Sexual activity: None  Other Topics Concern  . None  Social History Narrative   Holter monitor 08/2010: PVCs and sinus tachy.   Sleep Study (02/2008): mild sleep apnea, no indication for CPAP.   Past Surgical History:  Procedure Laterality Date  . APPENDECTOMY    . BACK SURGERY    .  CARDIAC CATHETERIZATION  2007   Clean Cardiac Cath ([place]), with RCA 30% narrowing likely due to catheter induced spasm in 09/02/10.  Marland Kitchen CARDIAC CATHETERIZATION  09/02/2010   mod. nonobstructive disease in the RCA and CX, tortuous LAD  . COLONOSCOPY W/ POLYPECTOMY  03/17/11   diminutive polyp  . LOOP RECORDER EXPLANT N/A 01/14/2014   Procedure: LOOP RECORDER EXPLANT;  Surgeon: Sanda Klein, MD;  Location: [facility] C[facility] LAB;  Service: Cardiovascular;  Laterality: N/A;  . LOOP RECORDER IMPLANT  04/06/2012   Reveal XT 7425  . LOOP RECORDER IMPLANT N/A 04/06/2012   Procedure: LOOP RECORDER IMPLANT;  Surgeon:  Sanda Klein, MD;  Location: [facility];  Service: Cardiovascular;  Laterality: N/A;  . NM MYOCAR PERF WALL MOTION  01/25/2008   mild anteroapical wall ischemia  . PATENT DUCTUS ARTERIOUS REPAIR     at age 70   Past Medical History:  Diagnosis Date  . Aneurysm ([place])   . Asthma   . Blood transfusion   . CAD (coronary artery disease), non obstructive on cath 2011 04/05/2012  . Coronary artery disease   . Diabetes mellitus   . Enlarged prostate   . GERD (gastroesophageal reflux disease)   . H/O syncope   . Heart disease   . History of repair of patent ductus arteriosus 07/23/2016  . Hyperlipidemia   . Hypertension   . 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 Refusal of blood transfusions as patient is Jehovah's Witness    BP 120/75 (BP Location: Left Arm, Patient Position: Sitting, Cuff Size: Normal)   Pulse 93   Resp 14   SpO2 97%   Opioid Risk Score:   Fall Risk Score:  `1  Depression screen PHQ 2/9  Depression screen [facility] 2/9 07/25/2017 06/08/2016 04/20/2016 12/25/2014  Decreased Interest 2 1 0 2  Down, Depressed, Hopeless 2 1 1 1   PHQ - 2 Score 4 2 1 3   Altered sleeping 3 3 - 1  Tired, decreased energy 2 1 - 1  Change in appetite 2 0 - 0  Feeling bad or failure about yourself  2 (No Data) - 2  Trouble concentrating 2 (No Data) - 2  Moving slowly or fidgety/restless 1 (No Data) - 2  Suicidal thoughts 0 0 - 0  PHQ-9 Score 16 6 - 11  Difficult doing work/chores - Somewhat difficult - -  Some recent data might be hidden    Review of Systems  Constitutional: Positive for diaphoresis and unexpected weight change.  HENT: Negative.   Eyes: Negative.   Respiratory: Positive for cough, shortness of breath and wheezing.   Gastrointestinal: Positive for constipation, diarrhea and nausea.  Endocrine: Negative.        High blood sugar  Genitourinary: Negative.   Musculoskeletal: Positive for arthralgias, back pain, gait problem, neck pain and neck stiffness.       Spasms  Skin:  Negative.   Allergic/Immunologic: Negative.   Neurological: Positive for tremors, weakness and numbness.       Tingling  Psychiatric/Behavioral: Positive for confusion and dysphoric mood. The patient is nervous/anxious.        Objective:   Physical Exam Negative straight leg raising Mildly diminished sensation to pinprick in the right L4 distribution compared to the left side. There is no evidence of foot intrinsic atrophy There is no lesions on the feet.  There is no ankle swelling or erythema. Ambulates with a cane no evidence of toe drag or knee instability His  lumbar spine has mild tenderness palpation the paraspinal muscle groups.        Assessment & Plan:  1.  Right L4 radiculopathy.  His prior surgical site was L4-5.  He has had no change in level that is affected.  There is some lateral recess stenosis which could potentially impinge upon L5 however his symptoms appear to be mainly L4 at this point.  These are really unchanged compared to prior other than increased intensity. Have discussed next steps.  Recommend L4-L5 transforaminal lumbar epidural steroid injection under fluoroscopic guidance. We discussed the risk and benefits of the procedure.  He is not on any blood thinners stronger than aspirin. We discussed he will need a driver. Continue current medications

## 2017-11-14 NOTE — Patient Instructions (Signed)
Epidural Steroid Injection Patient Information  Description: The epidural space surrounds the nerves as they exit the spinal cord.  In some patients, the nerves can be compressed and inflamed by a bulging disc or a tight spinal canal (spinal stenosis).  By injecting steroids into the epidural space, we can bring irritated nerves into direct contact with a potentially helpful medication.  These steroids act directly on the irritated nerves and can reduce swelling and inflammation which often leads to decreased pain.  Epidural steroids may be injected anywhere along the spine and from the neck to the low back depending upon the location of your pain.   After numbing the skin with local anesthetic (like Novocaine), a small needle is passed into the epidural space slowly.  You may experience a sensation of pressure while this is being done.  The entire block usually last less than 10 minutes.  Conditions which may be treated by epidural steroids:   Low back and leg pain  Neck and arm pain  Spinal stenosis  Post-laminectomy syndrome  Herpes zoster (shingles) pain  Pain from compression fractures  Preparation for the injection:  1. Do not eat any solid food or dairy products within 8 hours of your appointment.  2. You may drink clear liquids up to 3 hours before appointment.  Clear liquids include water, black coffee, juice or soda.  No milk or cream please. 3. You may take your regular medication, including pain medications, with a sip of water before your appointment  Diabetics should hold regular insulin (if taken separately) and take 1/2 normal NPH dos the morning of the procedure.  Carry some sugar containing items with you to your appointment. 4. A driver must accompany you and be prepared to drive you home after your procedure.  5. Bring all your current medications with your. 6. An IV may be inserted and sedation may be given at the discretion of the physician.   7. A blood pressure  cuff, EKG and other monitors will often be applied during the procedure.  Some patients may need to have extra oxygen administered for a short period. 8. You will be asked to provide medical information, including your allergies, prior to the procedure.  We must know immediately if you are taking blood thinners (like Coumadin/Warfarin)  Or if you are allergic to IV iodine contrast (dye). We must know if you could possible be pregnant.  Possible side-effects:  Bleeding from needle site  Infection (rare, may require surgery)  Nerve injury (rare)  Numbness & tingling (temporary)  Difficulty urinating (rare, temporary)  Spinal headache ( a headache worse with upright posture)  Light -headedness (temporary)  Pain at injection site (several days)  Decreased blood pressure (temporary)  Weakness in arm/leg (temporary)  Pressure sensation in back/neck (temporary)  Call if you experience:  Fever/chills associated with headache or increased back/neck pain.  Headache worsened by an upright position.  New onset weakness or numbness of an extremity below the injection site  Hives or difficulty breathing (go to the emergency room)  Inflammation or drainage at the infection site  Severe back/neck pain  Any new symptoms which are concerning to you  Please note:  Although the local anesthetic injected can often make your back or neck feel good for several hours after the injection, the pain will likely return.  It takes 3-7 days for steroids to work in the epidural space.  You may not notice any pain relief for at least that one week.    If effective, we will often do a series of three injections spaced 3-6 weeks apart to maximally decrease your pain.  After the initial series, we generally will wait several months before considering a repeat injection of the same type.  If you have any questions, please call [phone] [facility] 

## 2017-11-17 ENCOUNTER — Ambulatory Visit: Payer: Self-pay | Admitting: Physical Medicine & Rehabilitation

## 2017-11-17 ENCOUNTER — Ambulatory Visit: Payer: Self-pay

## 2017-11-28 ENCOUNTER — Other Ambulatory Visit: Payer: Self-pay | Admitting: Family

## 2017-11-28 ENCOUNTER — Other Ambulatory Visit: Payer: Self-pay | Admitting: Physical Medicine & Rehabilitation

## 2017-11-28 NOTE — Telephone Encounter (Signed)
[place] controlled substance registry reviewed and appropriate.

## 2017-11-28 NOTE — Telephone Encounter (Signed)
Requesting:clonazepam, Lorrin Mais Contract:09/29/17 UDS:09/29/17 Last Visit: Next Visit: Last Refill:  Please Advise

## 2017-11-29 ENCOUNTER — Other Ambulatory Visit: Payer: Self-pay | Admitting: Orthopedic Surgery

## 2017-11-29 DIAGNOSIS — M546 Pain in thoracic spine: Secondary | ICD-10-CM

## 2017-12-04 ENCOUNTER — Ambulatory Visit: Payer: Medicare Other | Admitting: Physical Medicine & Rehabilitation

## 2017-12-16 ENCOUNTER — Other Ambulatory Visit: Payer: Self-pay | Admitting: Family

## 2018-01-10 ENCOUNTER — Ambulatory Visit (INDEPENDENT_AMBULATORY_CARE_PROVIDER_SITE_OTHER): Payer: Medicare Other | Admitting: Family

## 2018-01-10 ENCOUNTER — Ambulatory Visit (HOSPITAL_BASED_OUTPATIENT_CLINIC_OR_DEPARTMENT_OTHER)
Admission: RE | Admit: 2018-01-10 | Discharge: 2018-01-10 | Disposition: A | Discharge status: Home / Self Care | Payer: Medicare Other | Source: Ambulatory Visit | Attending: Family | Admitting: Family

## 2018-01-10 ENCOUNTER — Encounter: Payer: Self-pay | Admitting: Family

## 2018-01-10 VITALS — BP 120/70 | HR 87 | Temp 97.9°F | Resp 16 | Ht 68.0 in | Wt 187.6 lb

## 2018-01-10 DIAGNOSIS — N400 Enlarged prostate without lower urinary tract symptoms: Secondary | ICD-10-CM | POA: Diagnosis not present

## 2018-01-10 DIAGNOSIS — R0781 Pleurodynia: Secondary | ICD-10-CM

## 2018-01-10 DIAGNOSIS — K05219 Aggressive periodontitis, localized, unspecified severity: Secondary | ICD-10-CM

## 2018-01-10 DIAGNOSIS — E785 Hyperlipidemia, unspecified: Secondary | ICD-10-CM | POA: Diagnosis not present

## 2018-01-10 DIAGNOSIS — J4541 Moderate persistent asthma with (acute) exacerbation: Secondary | ICD-10-CM | POA: Diagnosis not present

## 2018-01-10 DIAGNOSIS — E119 Type 2 diabetes mellitus without complications: Secondary | ICD-10-CM | POA: Diagnosis not present

## 2018-01-10 LAB — LDL CHOLESTEROL, DIRECT: Direct LDL: 74 mg/dL

## 2018-01-10 LAB — BASIC METABOLIC PANEL
BUN: 15 mg/dL (ref 6–23)
CALCIUM: 9.3 mg/dL (ref 8.4–10.5)
CO2: 30 mEq/L (ref 19–32)
CREATININE: 0.78 mg/dL (ref 0.40–1.50)
Chloride: 102 mEq/L (ref 96–112)
GFR: 113.32 mL/min (ref >60.00)
GLUCOSE: 99 mg/dL (ref 70–99)
Potassium: 4.6 mEq/L (ref 3.5–5.1)
Sodium: 138 mEq/L (ref 135–145)

## 2018-01-10 LAB — LIPID PANEL
CHOL/HDL RATIO: 4
CHOLESTEROL: 149 mg/dL (ref 0–200)
HDL: 40.6 mg/dL (ref >39.00)
NonHDL: 108.38
Triglycerides: 219 mg/dL — ABNORMAL HIGH (ref 0.0–149.0)
VLDL: 43.8 mg/dL — AB (ref 0.0–40.0)

## 2018-01-10 LAB — HEMOGLOBIN A1C: Hgb A1c MFr Bld: 5.6 % (ref 4.6–6.5)

## 2018-01-10 MED ORDER — AMOXICILLIN-POT CLAVULANATE 875-125 MG PO TABS: 1.0000 | ORAL_TABLET | Freq: Two times a day (BID) | ORAL | 0 refills | Status: DC

## 2018-01-10 MED ORDER — PREDNISONE 10 MG PO TABS: ORAL_TABLET | ORAL | 0 refills | Status: DC
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
## 2018-01-10 NOTE — Patient Instructions (Addendum)
Begin Augmentin twice daily for your dental infection and schedule dental visit as soon as you are able. Begin prednisone for your asthma flare. Complete lab work prior to leaving.  Complete chest x-ray on the first floor. Call if asthma symptoms worsen or if they fail to improve.
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
## 2018-01-31 ENCOUNTER — Ambulatory Visit (INDEPENDENT_AMBULATORY_CARE_PROVIDER_SITE_OTHER): Payer: Medicare Other | Admitting: Family

## 2018-01-31 ENCOUNTER — Encounter: Payer: Self-pay | Admitting: Family

## 2018-01-31 VITALS — BP 110/69 | HR 80 | Temp 98.5°F | Resp 18 | Ht 68.0 in | Wt 188.0 lb

## 2018-01-31 DIAGNOSIS — J45909 Unspecified asthma, uncomplicated: Secondary | ICD-10-CM

## 2018-01-31 DIAGNOSIS — J209 Acute bronchitis, unspecified: Secondary | ICD-10-CM | POA: Diagnosis not present

## 2018-01-31 MED ORDER — AZITHROMYCIN 250 MG PO TABS: ORAL_TABLET | ORAL | 0 refills | Status: DC

## 2018-01-31 MED ORDER — FLUTICASONE-SALMETEROL 500-50 MCG/DOSE IN AEPB: 1.0000 | INHALATION_SPRAY | Freq: Two times a day (BID) | RESPIRATORY_TRACT | 5 refills | Status: DC

## 2018-01-31 MED ORDER — ALBUTEROL SULFATE HFA 108 (90 BASE) MCG/ACT IN AERS: 2.0000 | INHALATION_SPRAY | Freq: Four times a day (QID) | RESPIRATORY_TRACT | 1 refills | Status: DC | PRN

## 2018-01-31 MED ORDER — PREDNISONE 10 MG PO TABS: ORAL_TABLET | ORAL | 0 refills | Status: DC

## 2018-01-31 NOTE — Patient Instructions (Addendum)
Please increase metformin to 2000mg  once daily while you are on prednisone, then you can decrease back to 1000mg  once daily.  Call if sugars >350 on this regimen. Start prednisone taper. Start zpak. Continue albuterol every 4-6 hours as needed for cough/wheezing. Begin Wixela if covered. You will be contacted about your referral to pulmonary.  Call if symptoms worsen or if not improved in 1 week.

## 2018-01-31 NOTE — Progress Notes (Signed)
Subjective:    Patient ID: Cody Raymond, male    DOB: 10-20-1970, 47 y.o.   MRN: [id number]  HPI  Cody Raymond is a 47 yr old male who presents today with chief complaint of cough. Cough has been present x 1 week and is associated with shortness of breath and burning in his chest. continue Symbicort twice daily, singulair and as needed albuterol.   Using nebulizer q4 hrs with brief improvement.  Denies fever. Cough is productive of yellow phlegm.  Reports that last time he took prednisone, sugar went >400.    Review of Systems See HPI  Past Medical History:  Diagnosis Date  . Aneurysm ([place])   . Asthma   . Blood transfusion   . CAD (coronary artery disease), non obstructive on cath 2011 04/05/2012  . Coronary artery disease   . Diabetes mellitus   . Enlarged prostate   . GERD (gastroesophageal reflux disease)   . H/O syncope   . Heart disease   . History of repair of patent ductus arteriosus 07/23/2016  . Hyperlipidemia   . Hypertension   . 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 Refusal of blood transfusions as patient is Jehovah's Witness      Social History   Socioeconomic History  . Marital status: Married    Spouse name: Not on file  . Number of children: Not on file  . Years of education: Not on file  . Highest education level: Not on file  Occupational History  . Not on file  Social Needs  . Financial resource strain: Not on file  . Food insecurity:    Worry: Not on file    Inability: Not on file  . Transportation needs:    Medical: Not on file    Non-medical: Not on file  Tobacco Use  . Smoking status: Never Smoker  . Smokeless tobacco: Never Used  Substance and Sexual Activity  . Alcohol use: No  . Drug use: No  . Sexual activity: Not on file  Lifestyle  . Physical activity:    Days per week: Not on file    Minutes per session: Not on file  . Stress: Not on file  Relationships  . Social connections:    Talks on phone: Not on file    Gets  together: Not on file    Attends religious service: Not on file    Active member of club or organization: Not on file    Attends meetings of clubs or organizations: Not on file    Relationship status: Not on file  . Intimate partner violence:    Fear of current or ex partner: Not on file    Emotionally abused: Not on file    Physically abused: Not on file    Forced sexual activity: Not on file  Other Topics Concern  . Not on file  Social History Narrative   Holter monitor 08/2010: PVCs and sinus tachy.   Sleep Study (02/2008): mild sleep apnea, no indication for CPAP.    Past Surgical History:  Procedure Laterality Date  . APPENDECTOMY    . BACK SURGERY    . CARDIAC CATHETERIZATION  2007   Clean Cardiac Cath ([place]), with RCA 30% narrowing likely due to catheter induced spasm in 09/02/10.  Marland Kitchen CARDIAC CATHETERIZATION  09/02/2010   mod. nonobstructive disease in the RCA and CX, tortuous LAD  . COLONOSCOPY W/ POLYPECTOMY  03/17/11   diminutive polyp  . LOOP RECORDER EXPLANT  N/A 01/14/2014   Procedure: LOOP RECORDER EXPLANT;  Surgeon: Sanda Klein, MD;  Location: [facility] CATH [facility];  Service: Cardiovascular;  Laterality: N/A;  . LOOP RECORDER IMPLANT  04/06/2012   Reveal [id number]  . LOOP RECORDER IMPLANT N/A 04/06/2012   Procedure: LOOP RECORDER IMPLANT;  Surgeon: Sanda Klein, MD;  Location: [facility] C[facility];  Service: Cardiovascular;  Laterality: N/A;  . NM MYOCAR PERF WALL MOTION  01/25/2008   mild anteroapical wall ischemia  . PATENT DUCTUS ARTERIOUS REPAIR     at age 65    Family History  Problem Relation Age of Onset  . Colon cancer Paternal Grandfather   . Prostate cancer Paternal Grandfather   . Aneurysm Father   . Heart attack Father 26  . Coronary artery disease Father   . Colon polyps Mother   . Heart disease Mother   . Coronary artery disease Mother   . Migraines Mother   . Breast cancer Maternal Grandmother   . Colon cancer Paternal Uncle        x 2  . Stomach  cancer Brother   . Liver disease Unknown        unsure who it was  . Allergies Daughter     Allergies  Allergen Reactions  . Benadryl [Diphenhydramine Hcl]     "drives me nuts"    Current Outpatient Medications on File Prior to Visit  Medication Sig Dispense Refill  . albuterol (PROVENTIL) (2.5 MG/3ML) 0.083% nebulizer solution USE 1 VIAL VIA NEBULIZER  EVERY 6 HOURS AS NEEDED FOR WHEEZING OR SHORTNESS OF  BREATH 600 mL 0  . aspirin 325 MG tablet Take 325 mg by mouth daily.    Marland Kitchen atorvastatin (LIPITOR) 80 MG tablet Take 1 tablet (80 mg total) by mouth daily. 90 tablet 1  . budesonide-formoterol (SYMBICORT) 160-4.5 MCG/ACT inhaler Inhale 2 puffs into the lungs 2 (two) times daily. 1 Inhaler 3  . cetirizine (ZYRTEC) 10 MG tablet Take 10 mg by mouth daily.    . clonazePAM (KLONOPIN) 1 MG tablet TAKE 1 TABLET BY MOUTH  DAILY 90 tablet 0  . diclofenac sodium (VOLTAREN) 1 % GEL Apply 2 g topically 4 (four) times daily as needed (pain). 720 g 0  . ezetimibe (ZETIA) 10 MG tablet TAKE 1 TABLET BY MOUTH  DAILY 90 tablet 1  . fenofibrate (TRICOR) 145 MG tablet Take 1 tablet by mouth  daily 90 tablet 1  . fish oil-omega-3 fatty acids 1000 MG capsule Take 2 g by mouth daily.     Marland Kitchen gabapentin (NEURONTIN) 300 MG capsule TAKE 1 CAPSULE BY MOUTH AT  6AM, 11:30AM AND 4:30PM AND TAKE 3 CAPSULES AT 10:00PM 540 capsule 1  . isosorbide mononitrate (IMDUR) 30 MG 24 hr tablet Take 1 tablet (30 mg total) by mouth daily. 30 tablet 0  . ketoconazole (NIZORAL) 2 % shampoo APPLY TOPICALLY DAILY 360 mL 1  . lansoprazole (PREVACID) 15 MG capsule Take 15 mg by mouth daily.    Marland Kitchen lisinopril (PRINIVIL,ZESTRIL) 10 MG tablet TAKE 1 TABLET BY MOUTH  DAILY 90 tablet 1  . loperamide (IMODIUM) 2 MG capsule Take 1 capsule (2 mg total) by mouth as needed for diarrhea or loose stools. 12 capsule 0  . meloxicam (MOBIC) 7.5 MG tablet TAKE 1 TABLET BY MOUTH  DAILY 90 tablet 0  . metFORMIN (GLUCOPHAGE-XR) 500 MG 24 hr tablet TAKE 2  TABLETS BY MOUTH  DAILY WITH BREAKFAST 180 tablet 1  . metoprolol (LOPRESSOR) 50 MG tablet Take  1 tablet (50 mg total) by mouth once as directed. 1 tablet 0  . montelukast (SINGULAIR) 10 MG tablet TAKE 1 TABLET BY MOUTH AT  BEDTIME 90 tablet 1  . Multiple Vitamin (MULTIVITAMIN WITH MINERALS) TABS tablet Take 1 tablet by mouth daily.    . nitroGLYCERIN (NITROSTAT) 0.4 MG SL tablet Place 1 tablet (0.4 mg total) under the tongue every 5 (five) minutes as needed for chest pain. 30 tablet 1  . ONETOUCH VERIO test strip CHECK BLOOD SUGAR 3 TIMES  DAILY 300 each 3  . sertraline (ZOLOFT) 100 MG tablet TAKE 1 TABLET BY MOUTH  DAILY 90 tablet 1  . tamsulosin (FLOMAX) 0.4 MG CAPS capsule TAKE 1 CAPSULE BY MOUTH  DAILY 90 capsule 1  . tiZANidine (ZANAFLEX) 2 MG tablet TAKE 1 TABLET BY MOUTH  EVERY 6 HOURS AS NEEDED FOR MUSCLE SPASM(S) 270 tablet 2  . traMADol (ULTRAM) 50 MG tablet TAKE 1 TABLET BY MOUTH  EVERY 6 HOURS AS NEEDED FOR SEVERE PAIN 120 tablet 1  . traZODone (DESYREL) 100 MG tablet TAKE 1 TABLET BY MOUTH AT  BEDTIME 90 tablet 1  . [DISCONTINUED] niacin (NIASPAN) 1000 MG CR tablet Take 1 tablet (1,000 mg total) by mouth at bedtime. 30 tablet 6   No current facility-administered medications on file prior to visit.     BP 110/69 (BP Location: Right Arm, Cuff Size: Large)   Pulse 80   Temp 98.5 F (36.9 C) (Oral)   Resp 18   Ht 5\' 8"  (1.727 m)   Wt 188 lb (85.3 kg)   SpO2 97%   BMI 28.59 kg/m       Objective:   Physical Exam  Constitutional: He is oriented to person, place, and time. He appears well-developed and well-nourished. No distress.  HENT:  Head: Normocephalic and atraumatic.  Right Ear: Tympanic membrane and ear canal normal.  Left Ear: Tympanic membrane and ear canal normal.  Cardiovascular: Normal rate and regular rhythm.  No murmur heard. Pulmonary/Chest: Effort normal. No respiratory distress. He has wheezes. He has no rales.  Musculoskeletal: He exhibits no edema.    Neurological: He is alert and oriented to person, place, and time.  Skin: Skin is warm and dry.  Psychiatric: He has a normal mood and affect. His behavior is normal. Thought content normal.          Assessment & Plan:  Bronchitis with bronchospasm- Advised pt as follows:  Please increase metformin to 2000mg  once daily while you are on prednisone, then you can decrease back to 1000mg  once daily.  Call if sugars >350 on this regimen. Start prednisone taper. Start zpak. Continue albuterol every 4-6 hours as needed for cough/wheezing. Begin Wixela if covered (pt could not afford symbicort and about to run out).  Advair not covered. You will be contacted about your referral to pulmonary.  Call if symptoms worsen or if not improved in 1 week.

## 2018-02-01 ENCOUNTER — Ambulatory Visit: Payer: Medicare Other | Admitting: Physical Medicine & Rehabilitation

## 2018-02-01 ENCOUNTER — Encounter: Payer: Self-pay | Admitting: Physical Medicine & Rehabilitation

## 2018-02-01 ENCOUNTER — Encounter: Payer: Medicare Other | Attending: Physical Medicine & Rehabilitation

## 2018-02-01 ENCOUNTER — Other Ambulatory Visit: Payer: Self-pay

## 2018-02-01 VITALS — BP 110/72 | HR 91 | Ht 68.0 in | Wt 187.8 lb

## 2018-02-01 DIAGNOSIS — I2510 Atherosclerotic heart disease of native coronary artery without angina pectoris: Secondary | ICD-10-CM | POA: Diagnosis not present

## 2018-02-01 DIAGNOSIS — Z5181 Encounter for therapeutic drug level monitoring: Secondary | ICD-10-CM | POA: Diagnosis not present

## 2018-02-01 DIAGNOSIS — G894 Chronic pain syndrome: Secondary | ICD-10-CM | POA: Diagnosis not present

## 2018-02-01 DIAGNOSIS — Z803 Family history of malignant neoplasm of breast: Secondary | ICD-10-CM | POA: Diagnosis not present

## 2018-02-01 DIAGNOSIS — G63 Polyneuropathy in diseases classified elsewhere: Secondary | ICD-10-CM

## 2018-02-01 DIAGNOSIS — M797 Fibromyalgia: Secondary | ICD-10-CM | POA: Insufficient documentation

## 2018-02-01 DIAGNOSIS — K219 Gastro-esophageal reflux disease without esophagitis: Secondary | ICD-10-CM | POA: Diagnosis not present

## 2018-02-01 DIAGNOSIS — M5416 Radiculopathy, lumbar region: Secondary | ICD-10-CM | POA: Diagnosis not present

## 2018-02-01 DIAGNOSIS — Z8042 Family history of malignant neoplasm of prostate: Secondary | ICD-10-CM | POA: Insufficient documentation

## 2018-02-01 DIAGNOSIS — N400 Enlarged prostate without lower urinary tract symptoms: Secondary | ICD-10-CM | POA: Insufficient documentation

## 2018-02-01 DIAGNOSIS — E1142 Type 2 diabetes mellitus with diabetic polyneuropathy: Secondary | ICD-10-CM | POA: Diagnosis not present

## 2018-02-01 DIAGNOSIS — M7661 Achilles tendinitis, right leg: Secondary | ICD-10-CM | POA: Insufficient documentation

## 2018-02-01 DIAGNOSIS — Z8489 Family history of other specified conditions: Secondary | ICD-10-CM | POA: Diagnosis not present

## 2018-02-01 DIAGNOSIS — I10 Essential (primary) hypertension: Secondary | ICD-10-CM | POA: Diagnosis not present

## 2018-02-01 DIAGNOSIS — M961 Postlaminectomy syndrome, not elsewhere classified: Secondary | ICD-10-CM | POA: Diagnosis not present

## 2018-02-01 DIAGNOSIS — Z800 Family history of malignant neoplasm of digestive organs: Secondary | ICD-10-CM | POA: Diagnosis not present

## 2018-02-01 DIAGNOSIS — Z9889 Other specified postprocedural states: Secondary | ICD-10-CM | POA: Insufficient documentation

## 2018-02-01 DIAGNOSIS — Z79891 Long term (current) use of opiate analgesic: Secondary | ICD-10-CM | POA: Diagnosis not present

## 2018-02-01 DIAGNOSIS — Z8249 Family history of ischemic heart disease and other diseases of the circulatory system: Secondary | ICD-10-CM | POA: Insufficient documentation

## 2018-02-01 DIAGNOSIS — E119 Type 2 diabetes mellitus without complications: Secondary | ICD-10-CM | POA: Insufficient documentation

## 2018-02-01 MED ORDER — TRAMADOL HCL 50 MG PO TABS: ORAL_TABLET | ORAL | 4 refills | Status: DC

## 2018-02-01 NOTE — Progress Notes (Signed)
Subjective:    Patient ID: Cody Raymond, male    DOB: [birth year], 47 y.o.   MRN: [id number]  06/03/2011 Decompressive laminectomy as well as microdiscectomy and foraminotomies right L4-L5. Had preoperative ankle dorsiflexor and toe extensor weakness. Dr Gladstone Lighter and [place] Patient was given permanent restrictions by orthopedics no lifting greater than 5 pounds no stooping or bending He has undergone right L4 selective nerve root block 09/06/2013, good short-term improvement in pain down the right lower extremity, preinjection 8/10 post injection 0/10  HPI 47yo male with history of chronic low back pain, chronic right lower extremity pain, chronic widespread body pain.  Approximately 6 months ago started complaining of increasing pain down the right lower extremity.  This prompted MRI of the lumbar spine which showed no changes compared to prior studies.  Given prior favorable response to right L4 transforaminal injection this was recommended however the patient "chickened out". Continues to ambulate with a cane otherwise independent with all self-care and mobility.  He avoids taking tramadol when he is taking care of his grandson. Patient takes care of his grandson every other week.  He alternates taking between 2 and 4 tramadol per day depending on his current childcare responsibilities. No new bowel or bladder dysfunction.  Past medical history significant for diabetes with neuropathy.  This is affected both lower extremities. Pain Inventory Average Pain 9 Pain Right Now 8 My pain is sharp, burning, dull, stabbing, tingling and aching  In the last 24 hours, has pain interfered with the following? General activity 4 Relation with others 5 Enjoyment of life 5 What TIME of day is your pain at its worst? all Sleep (in general) Fair  Pain is worse with: walking, bending, sitting and standing Pain improves with: rest and medication Relief from Meds: 4  Mobility use a cane how  many minutes can you walk? 15 ability to climb steps?  no do you drive?  yes  Function disabled: date disabled n/a I need assistance with the following:  dressing and bathing  Neuro/Psych weakness numbness tremor tingling spasms dizziness depression anxiety  Prior Studies Any changes since last visit?  no  Physicians involved in your care Any changes since last visit?  no   Family History  Problem Relation Age of Onset  . Colon cancer Paternal Grandfather   . Prostate cancer Paternal Grandfather   . Aneurysm Father   . Heart attack Father 93  . Coronary artery disease Father   . Colon polyps Mother   . Heart disease Mother   . Coronary artery disease Mother   . Migraines Mother   . Breast cancer Maternal Grandmother   . Colon cancer Paternal Uncle        x 2  . Stomach cancer Brother   . Liver disease Unknown        unsure who it was  . Allergies Daughter    Social History   Socioeconomic History  . Marital status: Married    Spouse name: Not on file  . Number of children: Not on file  . Years of education: Not on file  . Highest education level: Not on file  Occupational History  . Not on file  Social Needs  . Financial resource strain: Not on file  . Food insecurity:    Worry: Not on file    Inability: Not on file  . Transportation needs:    Medical: Not on file    Non-medical: Not on file  Tobacco Use  .  Smoking status: Never Smoker  . Smokeless tobacco: Never Used  Substance and Sexual Activity  . Alcohol use: No  . Drug use: No  . Sexual activity: Not on file  Lifestyle  . Physical activity:    Days per week: Not on file    Minutes per session: Not on file  . Stress: Not on file  Relationships  . Social connections:    Talks on phone: Not on file    Gets together: Not on file    Attends religious service: Not on file    Active member of club or organization: Not on file    Attends meetings of clubs or organizations: Not on file     Relationship status: Not on file  Other Topics Concern  . Not on file  Social History Narrative   Holter monitor 08/2010: PVCs and sinus tachy.   Sleep Study (02/2008): mild sleep apnea, no indication for CPAP.   Past Surgical History:  Procedure Laterality Date  . APPENDECTOMY    . BACK SURGERY    . CARDIAC CATHETERIZATION  2007   Clean Cardiac Cath ([place]), with RCA 30% narrowing likely due to catheter induced spasm in 09/02/10.  [facility] CARDIAC CATHETERIZATION  09/02/2010   mod. nonobstructive disease in the RCA and CX, tortuous LAD  . COLONOSCOPY W/ POLYPECTOMY  03/17/11   diminutive polyp  . LOOP RECORDER EXPLANT N/A 01/14/2014   Procedure: LOOP RECORDER EXPLANT;  Surgeon: Sanda Klein, MD;  Location: [facility]ATH [facility];  Service: Cardiovascular;  Laterality: N/A;  . LOOP RECORDER IMPLANT  04/06/2012   Reveal XT 8416  . LOOP RECORDER IMPLANT N/A 04/06/2012   Procedure: LOOP RECORDER IMPLANT;  Surgeon: Sanda Klein, MD;  Location: [facility];  Service: Cardiovascular;  Laterality: N/A;  . NM MYOCAR PERF WALL MOTION  01/25/2008   mild anteroapical wall ischemia  . PATENT DUCTUS ARTERIOUS REPAIR     at age 59   Past Medical History:  Diagnosis Date  . Aneurysm ([place])   . Asthma   . Blood transfusion   . CAD (coronary artery disease), non obstructive on cath 2011 04/05/2012  . Coronary artery disease   . Diabetes mellitus   . Enlarged prostate   . GERD (gastroesophageal reflux disease)   . H/O syncope   . Heart disease   . History of repair of patent ductus arteriosus 07/23/2016  . Hyperlipidemia   . Hypertension   . 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 Refusal of blood transfusions as patient is Jehovah's Witness    BP 110/72   Pulse 91   Ht 5\' 8"  (1.727 m) Comment: pt reported  Wt 187 lb 12.8 oz (85.2 kg)   SpO2 96%   BMI 28.55 kg/m   Opioid Risk Score:   Fall Risk Score:  `1  Depression screen PHQ 2/9  Depression screen [facility] 2/9 02/01/2018 02/01/2018 01/10/2018  07/25/2017 06/08/2016 04/20/2016 12/25/2014  Decreased Interest 2 2 2 2 1  0 2  Down, Depressed, Hopeless 2 2 2 2 1 1 1   PHQ - 2 Score 4 4 4 4 2 1 3   Altered sleeping - - 2 3 3  - 1  Tired, decreased energy - - 2 2 1  - 1  Change in appetite - - 2 2 0 - 0  Feeling bad or failure about yourself  - - 2 2 (No Data) - 2  Trouble concentrating - - 1 2 (No Data) - 2  Moving slowly or  fidgety/restless - - 2 1 (No Data) - 2  Suicidal thoughts - - 0 0 0 - 0  PHQ-9 Score - - 15 16 6  - 11  Difficult doing work/chores - - Somewhat difficult - Somewhat difficult - -  Some recent data might be hidden    Review of Systems  Constitutional: Positive for unexpected weight change.  HENT: Negative.   Eyes: Negative.   Respiratory: Positive for cough, shortness of breath and wheezing.   Gastrointestinal: Positive for abdominal pain, constipation, diarrhea and nausea.  Endocrine: Negative.   Genitourinary: Negative.   Musculoskeletal: Negative.   Skin: Negative.   Allergic/Immunologic: Negative.   Neurological: Negative.   Hematological: Negative.   Psychiatric/Behavioral: Negative.   All other systems reviewed and are negative.      Objective:   Physical Exam  Constitutional: He is oriented to person, place, and time. He appears well-developed and well-nourished. No distress.  HENT:  Head: Normocephalic and atraumatic.  Eyes: Pupils are equal, round, and reactive to light. EOM are normal.  Neurological: He is alert and oriented to person, place, and time.  Reflex Scores:      Patellar reflexes are 0 on the right side and 0 on the left side.      Achilles reflexes are 0 on the right side and 0 on the left side. Motor strength is 5/5 bilateral deltoid by stress grip left hip flexor knee extensor ankle dorsiflexor right hip flexor knee extensor and 4/5 right ankle dorsiflexor   Skin: He is not diaphoretic.  Nursing note and vitals reviewed.  Musculoskeletal he has limited range of motion of his  lumbar thoracic and cervical spine approximately 25% of each.  Negative straight leg raising Ambulates with straight cane no evidence of toe drag or knee instability      Assessment & Plan:  1.  Lumbar postlaminectomy syndrome with chronic right L4 radiculopathy. Continue tramadol 50 mg 2 to 4 tablets/day In addition patient is taking gabapentin 300 mg 3 times daily and 900 mg at night  Indication for chronic opioid: See above Medication and dose: See above # pills per month: 120 Last UDS date: 02/01/2018 Opioid Treatment Agreement signed (Y/N): Yes Opioid Treatment Agreement last reviewed with patient:  02/01/2018 [facility] reviewed this encounter (include red flags):  Primary care provider prescribes both Ambien and Klonopin on a chronic basis  2.  Fibromyalgia syndrome recommend exercise he is noncompliant with home exercise program.  Recommended that he becomes consistent with this  35-month follow-up with nurse practitioner Patient is to call to schedule for epidural injection should he wish to pursue this treatment option.

## 2018-02-01 NOTE — Patient Instructions (Signed)
Please call if you'd like to get injection Right L4 ESI

## 2018-02-03 ENCOUNTER — Other Ambulatory Visit: Payer: Self-pay | Admitting: Family

## 2018-02-03 DIAGNOSIS — I10 Essential (primary) hypertension: Secondary | ICD-10-CM

## 2018-02-03 DIAGNOSIS — E785 Hyperlipidemia, unspecified: Secondary | ICD-10-CM

## 2018-02-03 DIAGNOSIS — G4700 Insomnia, unspecified: Secondary | ICD-10-CM

## 2018-02-03 DIAGNOSIS — J45909 Unspecified asthma, uncomplicated: Secondary | ICD-10-CM

## 2018-02-03 DIAGNOSIS — M545 Low back pain, unspecified: Secondary | ICD-10-CM

## 2018-02-03 DIAGNOSIS — E119 Type 2 diabetes mellitus without complications: Secondary | ICD-10-CM

## 2018-02-05 NOTE — Telephone Encounter (Signed)
Requesting: Trazodone 100mg  po HS Contract: yes  UDS: yes 09/29/17 Last OV: 01/31/18 Next Ov: 04/20/18 Last refill: 09/01/2017 Database: unknown  Please advise.

## 2018-02-07 ENCOUNTER — Telehealth: Payer: Self-pay | Admitting: Family

## 2018-02-07 DIAGNOSIS — J45909 Unspecified asthma, uncomplicated: Secondary | ICD-10-CM

## 2018-02-07 NOTE — Telephone Encounter (Signed)
Melissa -- please advise if ok to provide RX. I have pended order below.

## 2018-02-07 NOTE — Telephone Encounter (Signed)
Copied from [place] [phone]. Topic: Quick Communication - See Telephone Encounter >> Feb 07, 2018  9:19 AM Ether Griffins B wrote: CRM for notification. See Telephone encounter for: 02/07/18.  Pt is needing a script for a nebulizer machine with mask and hose. His broke and for insurance to cover it he will need a script. Please send to [place], [place]

## 2018-02-07 NOTE — Telephone Encounter (Signed)
Yes please

## 2018-02-07 NOTE — Telephone Encounter (Signed)
Order placed and message sent to [place] via community message. Notified pt.

## 2018-02-08 ENCOUNTER — Other Ambulatory Visit: Payer: Self-pay

## 2018-02-08 LAB — TOXASSURE SELECT,+ANTIDEPR,UR

## 2018-02-08 MED ORDER — PROAIR HFA 108 (90 BASE) MCG/ACT IN AERS: 2.0000 | INHALATION_SPRAY | Freq: Four times a day (QID) | RESPIRATORY_TRACT | 1 refills | Status: DC | PRN

## 2018-02-09 ENCOUNTER — Other Ambulatory Visit: Payer: Self-pay | Admitting: Family

## 2018-02-09 NOTE — Telephone Encounter (Signed)
Last clonazepam RX: 11/28/17, #90 Last zolpidem RX: 11/28/17, #90. No longer on current med list? Last OV: 01/31/18 Next OV: 04/20/18 UDS: 02/01/18 with Dr Ella Bodo CSC: 09/29/17 CSR:  Please review controlled substance registry and advise.

## 2018-02-15 ENCOUNTER — Telehealth: Payer: Self-pay | Admitting: *Deleted

## 2018-02-15 NOTE — Telephone Encounter (Signed)
Urine drug screen for this encounter is consistent for prescribed medication 

## 2018-03-06 ENCOUNTER — Other Ambulatory Visit: Payer: Self-pay | Admitting: Physical Medicine & Rehabilitation

## 2018-03-06 ENCOUNTER — Other Ambulatory Visit: Payer: Self-pay | Admitting: Family

## 2018-03-06 NOTE — Telephone Encounter (Signed)
Cody Raymond -- please advise zolpidem request?  It is not on current med list.

## 2018-03-08 NOTE — Telephone Encounter (Signed)
My record shows that we discontinued this medication in April.  Please contact patient him know that we would like him to do the trazodone instead.

## 2018-03-09 NOTE — Telephone Encounter (Signed)
Notified pt and he states he has not been taking zolpidem. He currently has trazodone on hand.

## 2018-03-12 ENCOUNTER — Encounter: Payer: Self-pay | Admitting: Pulmonary Disease

## 2018-03-12 ENCOUNTER — Ambulatory Visit (INDEPENDENT_AMBULATORY_CARE_PROVIDER_SITE_OTHER): Payer: Medicare Other | Admitting: Pulmonary Disease

## 2018-03-12 VITALS — BP 118/70 | HR 108 | Ht 68.0 in | Wt 189.8 lb

## 2018-03-12 DIAGNOSIS — R490 Dysphonia: Secondary | ICD-10-CM

## 2018-03-12 DIAGNOSIS — J40 Bronchitis, not specified as acute or chronic: Secondary | ICD-10-CM

## 2018-03-12 DIAGNOSIS — J45909 Unspecified asthma, uncomplicated: Secondary | ICD-10-CM

## 2018-03-12 MED ORDER — LOSARTAN POTASSIUM 25 MG PO TABS: 25.0000 mg | ORAL_TABLET | Freq: Every day | ORAL | 3 refills | Status: DC

## 2018-03-12 MED ORDER — FLUTICASONE-SALMETEROL 232-14 MCG/ACT IN AEPB: 1.0000 | INHALATION_SPRAY | Freq: Two times a day (BID) | RESPIRATORY_TRACT | 3 refills | Status: DC

## 2018-03-12 NOTE — Patient Instructions (Signed)
For recurrent bronchitis: We will check serum IgG, IgM, IgA, and IgE We will check a serum IgG subclass level  For asthma: We will check a lung function test We will start a medicine called Airduo 1 puff twice a day  For hypertension: Stop lisinopril Start taking losartan 25mg  daily  For hoarseness and upper airway cough: Stop taking lisinopril We will refer you to physician named Dr. Ernestine Conrad at [facility] to assess for a condition called vocal cord dysfunction  We will see you back in 4 to 6 weeks or sooner if needed

## 2018-03-12 NOTE — Progress Notes (Signed)
Synopsis: Referred in June 2019 for cough  Subjective:   PATIENT ID: Cody Raymond GENDER: male DOB: 1971-07-18, MRN: [id number]   HPI  Chief Complaint  Patient presents with  . pulmonary consult    referred by Dr. Inda Castle for chronic bronchitis. patient complains of SOB, lung pain.   He says that he has had bronchitis 3-4 times this year and he has been dealing with this for a few years.  He notes a "pain in his lungs" > the pain is below his shoulders when he takes a deep breath > this has been there for over a year  He has been having the recurrent bronchitis for 3-4 years. He had asthma as a kid and feels like this has been getting worse over the years.   > asa child he was hospitalized every other week while living in [place] > recently he has been able to avoid hospitalized > he takes prednisone 3-4 times a year > Advair worked pretty well, he had flares but not as many as since he has come off of the Advair, he would still have "bronchitis:" > he takes singulair > no recent change in his living condition > disability due to back pain > no recent mold, mildew > Started taking lisinopril about 4 years ago.  Cough: Dry cough occurs near constantly.  Says this has been a problem for several years.  His daughter has asthma.    He has never smoked.    He notes significant hoarseness.  Past Medical History:  Diagnosis Date  . Aneurysm ([place])   . Asthma   . Blood transfusion   . CAD (coronary artery disease), non obstructive on cath 2011 04/05/2012  . Coronary artery disease   . Diabetes mellitus   . Enlarged prostate   . GERD (gastroesophageal reflux disease)   . H/O syncope   . Heart disease   . History of repair of patent ductus arteriosus 07/23/2016  . Hyperlipidemia   . Hypertension   . 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 Refusal of blood transfusions as patient is Jehovah's Witness      Family History  Problem Relation Age of Onset  . Colon  cancer Paternal Grandfather   . Prostate cancer Paternal Grandfather   . Aneurysm Father   . Heart attack Father 54  . Coronary artery disease Father   . Colon polyps Mother   . Heart disease Mother   . Coronary artery disease Mother   . Migraines Mother   . Breast cancer Maternal Grandmother   . Colon cancer Paternal Uncle        x 2  . Stomach cancer Brother   . Liver disease Unknown        unsure who it was  . Allergies Daughter      Social History   Socioeconomic History  . Marital status: Married    Spouse name: Not on file  . Number of children: Not on file  . Years of education: Not on file  . Highest education level: Not on file  Occupational History  . Not on file  Social Needs  . Financial resource strain: Not on file  . Food insecurity:    Worry: Not on file    Inability: Not on file  . Transportation needs:    Medical: Not on file    Non-medical: Not on file  Tobacco Use  . Smoking status: Never Smoker  . Smokeless tobacco: Never Used  Substance and Sexual Activity  . Alcohol use: No  . Drug use: No  . Sexual activity: Not on file  Lifestyle  . Physical activity:    Days per week: Not on file    Minutes per session: Not on file  . Stress: Not on file  Relationships  . Social connections:    Talks on phone: Not on file    Gets together: Not on file    Attends religious service: Not on file    Active member of club or organization: Not on file    Attends meetings of clubs or organizations: Not on file    Relationship status: Not on file  . Intimate partner violence:    Fear of current or ex partner: Not on file    Emotionally abused: Not on file    Physically abused: Not on file    Forced sexual activity: Not on file  Other Topics Concern  . Not on file  Social History Narrative   Holter monitor 08/2010: PVCs and sinus tachy.   Sleep Study (02/2008): mild sleep apnea, no indication for CPAP.     Allergies  Allergen Reactions  . Benadryl  [Diphenhydramine Hcl]     "drives me nuts"     Outpatient Medications Prior to Visit  Medication Sig Dispense Refill  . albuterol (PROVENTIL) (2.5 MG/3ML) 0.083% nebulizer solution USE 1 VIAL VIA NEBULIZER  EVERY 6 HOURS AS NEEDED FOR WHEEZING OR SHORTNESS OF  BREATH 600 mL 1  . aspirin 325 MG tablet Take 325 mg by mouth daily.    Marland Kitchen atorvastatin (LIPITOR) 80 MG tablet TAKE 1 TABLET BY MOUTH  DAILY 90 tablet 1  . cetirizine (ZYRTEC) 10 MG tablet Take 10 mg by mouth daily.    . clonazePAM (KLONOPIN) 1 MG tablet TAKE 1 TABLET BY MOUTH  DAILY 90 tablet 0  . diclofenac sodium (VOLTAREN) 1 % GEL Apply 2 g topically 4 (four) times daily as needed (pain). 720 g 0  . ezetimibe (ZETIA) 10 MG tablet TAKE 1 TABLET BY MOUTH  DAILY 90 tablet 1  . fenofibrate (TRICOR) 145 MG tablet Take 1 tablet by mouth  daily 90 tablet 1  . fish oil-omega-3 fatty acids 1000 MG capsule Take 2 g by mouth daily.     Marland Kitchen gabapentin (NEURONTIN) 300 MG capsule TAKE 1 CAPSULE BY MOUTH AT  6AM, 11:30AM AND 4:30PM AND TAKE 3 CAPSULES AT 10:00PM 540 capsule 1  . isosorbide mononitrate (IMDUR) 30 MG 24 hr tablet Take 1 tablet (30 mg total) by mouth daily. 30 tablet 0  . ketoconazole (NIZORAL) 2 % shampoo APPLY TOPICALLY DAILY 360 mL 1  . lansoprazole (PREVACID) 15 MG capsule Take 15 mg by mouth daily.    Marland Kitchen lisinopril (PRINIVIL,ZESTRIL) 10 MG tablet TAKE 1 TABLET BY MOUTH  DAILY 90 tablet 1  . loperamide (IMODIUM) 2 MG capsule Take 1 capsule (2 mg total) by mouth as needed for diarrhea or loose stools. 12 capsule 0  . meloxicam (MOBIC) 7.5 MG tablet TAKE 1 TABLET BY MOUTH  DAILY 90 tablet 0  . metFORMIN (GLUCOPHAGE-XR) 500 MG 24 hr tablet TAKE 2 TABLETS BY MOUTH  DAILY WITH BREAKFAST 180 tablet 1  . metoprolol (LOPRESSOR) 50 MG tablet Take 1 tablet (50 mg total) by mouth once as directed. 1 tablet 0  . montelukast (SINGULAIR) 10 MG tablet TAKE 1 TABLET BY MOUTH AT  BEDTIME 90 tablet 1  . Multiple Vitamin (MULTIVITAMIN WITH MINERALS)  TABS tablet Take  1 tablet by mouth daily.    . nitroGLYCERIN (NITROSTAT) 0.4 MG SL tablet Place 1 tablet (0.4 mg total) under the tongue every 5 (five) minutes as needed for chest pain. 30 tablet 1  . ONETOUCH VERIO test strip CHECK BLOOD SUGAR 3 TIMES  DAILY 300 each 3  . PROAIR HFA 108 (90 Base) MCG/ACT inhaler Inhale 2 puffs into the lungs every 6 (six) hours as needed for wheezing or shortness of breath. 3 Inhaler 1  . sertraline (ZOLOFT) 100 MG tablet TAKE 1 TABLET BY MOUTH  DAILY 90 tablet 1  . tamsulosin (FLOMAX) 0.4 MG CAPS capsule TAKE 1 CAPSULE BY MOUTH  DAILY 90 capsule 1  . tiZANidine (ZANAFLEX) 2 MG tablet TAKE 1 TABLET BY MOUTH  EVERY 6 HOURS AS NEEDED FOR MUSCLE SPASM(S) 270 tablet 2  . traMADol (ULTRAM) 50 MG tablet TAKE 1 TABLET BY MOUTH  EVERY 6 HOURS AS NEEDED FOR SEVERE PAIN 120 tablet 4  . traZODone (DESYREL) 100 MG tablet TAKE 1 TABLET BY MOUTH AT  BEDTIME 90 tablet 1  . azithromycin (ZITHROMAX) 250 MG tablet 2 tabs by mouth today, then one tab once daily for 4 more days. 6 tablet 0  . budesonide-formoterol (SYMBICORT) 160-4.5 MCG/ACT inhaler Inhale 2 puffs into the lungs 2 (two) times daily. 1 Inhaler 3  . Fluticasone-Salmeterol (WIXELA INHUB) 500-50 MCG/DOSE AEPB Inhale 1 puff into the lungs 2 (two) times daily. 60 each 5  . predniSONE (DELTASONE) 10 MG tablet 4 tabs by mouth once daily for 3 days, then 3 tabs daily x 3 days, then 2 tabs daily x 3 days, then 1 tab daily x 3 days 30 tablet 0  . tiZANidine (ZANAFLEX) 2 MG tablet TAKE 1 TABLET BY MOUTH  EVERY 6 HOURS AS NEEDED FOR MUSCLE SPASM(S) 270 tablet 2   No facility-administered medications prior to visit.     Review of Systems  Constitutional: Negative for chills, fever, malaise/fatigue and weight loss.  HENT: Negative for congestion, nosebleeds, sinus pain and sore throat.   Eyes: Negative for photophobia, pain and discharge.  Respiratory: Positive for cough, shortness of breath and wheezing. Negative for  hemoptysis.   Cardiovascular: Negative for chest pain, palpitations, orthopnea and leg swelling.  Gastrointestinal: Negative for abdominal pain, constipation, diarrhea, nausea and vomiting.  Genitourinary: Negative for dysuria, frequency, hematuria and urgency.  Musculoskeletal: Negative for back pain, joint pain, myalgias and neck pain.  Skin: Negative for itching and rash.  Neurological: Negative for tingling, tremors, sensory change, speech change, focal weakness, seizures, weakness and headaches.  Psychiatric/Behavioral: Negative for memory loss, substance abuse and suicidal ideas. The patient is not nervous/anxious.       Objective:  Physical Exam   Vitals:   03/12/18 1444  BP: 118/70  Pulse: (!) 108  SpO2: 96%  Weight: 189 lb 12 oz (86.1 kg)  Height: _0  (1.727 m)   Gen: well appearing, no acute distress HENT: NCAT, OP clear, neck supple without masses Eyes: PERRL, EOMi Lymph: no cervical lymphadenopathy PULM: Audible upper airway wheeze present only on exam, otherwise CTA B CV: RRR, no mgr, no JVD GI: BS+, soft, nontender, no hsm Derm: no rash or skin breakdown MSK: normal bulk and tone Neuro: A&Ox4, CN II-XII intact, strength 5/5 in all 4 extremities Psyche: normal mood and affect   CBC    Component Value Date/Time   WBC 6.3 12/21/2016 0238   RBC 4.89 12/21/2016 0238   HGB 13.0 12/21/2016 0238   HCT 38.4 (  L) 12/21/2016 0238   PLT 268 12/21/2016 0238   MCV 78.5 12/21/2016 0238   MCH 26.6 12/21/2016 0238   MCHC 33.9 12/21/2016 0238   RDW 16.0 (H) 12/21/2016 0238   LYMPHSABS 1.4 11/09/2016 1138   MONOABS 0.4 11/09/2016 1138   EOSABS 0.1 11/09/2016 1138   BASOSABS 0.0 11/09/2016 1138     Chest imaging: 2019 chest x-ray images independently reviewed showing what appears to be older fractured ribs but otherwise normal pulmonary parenchyma 2018 CT angiogram chest images independently reviewed showing normal pulmonary  parenchyma  PFT:  Labs:  Path:  Echo:  Heart Catheterization:   Records from his May 2019 visit with PCP reviewed where he was seen for cough, prednisone was prescribed metformin was adjusted because of prior episodes of hyperglycemia    Assessment & Plan:   Other fatigue - Plan: CANCELED: CBC w/Diff, CANCELED: Comp Met (CMET)  Drug toxicity - Plan: CANCELED: CBC w/Diff, CANCELED: Comp Met (CMET)  Discussion: This is a pleasant 47 year old non-smoking male with lifelong asthma who comes to my clinic today for complaints of recurrent bronchitis and poorly controlled asthma.  He is currently not taking an asthma controller medicine because of insurance problems.  I think the most logical transition would be to get him back on salmeterol and fluticasone and fortunately there are generic options for that.  Today on physical exam he has a notable upper airway wheeze which is variable.  I have a strong index of suspicion of vocal cord dysfunction.  This may be exacerbated by the patient's underlying gastroesophageal reflux disease and lisinopril use.  He does have a dry near constant cough which is been present for several years ever since he started the lisinopril.  Further, the episodes of "bronchitis" may actually just be chronic cough exacerbated by an acute viral infection.  That being said, if he does have recurrent episodes of bronchitis we need to consider an underlying immune deficiency.  Plan: For recurrent bronchitis: We will check serum IgG, IgM, IgA, and IgE We will check a serum IgG subclass level  For asthma: We will check a lung function test We will start a medicine called Airduo 1 255mg/14mcg puff twice a day  For hypertension: Stop lisinopril Start taking losartan '25mg'$  daily  For hoarseness and upper airway cough: Stop taking lisinopril We will [facility] a condition called vocal  cord dysfunction  We will see you back in 4 to 6 weeks or sooner if needed    Current Outpatient Medications:  .  albuterol (PROVENTIL) (2.5 MG/3ML) 0.083% nebulizer solution, USE 1 VIAL VIA NEBULIZER  EVERY 6 HOURS AS NEEDED FOR WHEEZING OR SHORTNESS OF  BREATH, Disp: 600 mL, Rfl: 1 .  aspirin 325 MG tablet, Take 325 mg by mouth daily., Disp: , Rfl:  .  atorvastatin (LIPITOR) 80 MG tablet, TAKE 1 TABLET BY MOUTH  DAILY, Disp: 90 tablet, Rfl: 1 .  cetirizine (ZYRTEC) 10 MG tablet, Take 10 mg by mouth daily., Disp: , Rfl:  .  clonazePAM (KLONOPIN) 1 MG tablet, TAKE 1 TABLET BY MOUTH  DAILY, Disp: 90 tablet, Rfl: 0 .  diclofenac sodium (VOLTAREN) 1 % GEL, Apply 2 g topically 4 (four) times daily as needed (pain)., Disp: 720 g, Rfl: 0 .  ezetimibe (ZETIA) 10 MG tablet, TAKE 1 TABLET BY MOUTH  DAILY, Disp: 90 tablet, Rfl: 1 .  fenofibrate (TRICOR) 145 MG tablet, Take  1 tablet by mouth  daily, Disp: 90 tablet, Rfl: 1 .  fish oil-omega-3 fatty acids 1000 MG capsule, Take 2 g by mouth daily. , Disp: , Rfl:  .  gabapentin (NEURONTIN) 300 MG capsule, TAKE 1 CAPSULE BY MOUTH AT  6AM, 11:30AM AND 4:30PM AND TAKE 3 CAPSULES AT 10:00PM, Disp: 540 capsule, Rfl: 1 .  isosorbide mononitrate (IMDUR) 30 MG 24 hr tablet, Take 1 tablet (30 mg total) by mouth daily., Disp: 30 tablet, Rfl: 0 .  ketoconazole (NIZORAL) 2 % shampoo, APPLY TOPICALLY DAILY, Disp: 360 mL, Rfl: 1 .  lansoprazole (PREVACID) 15 MG capsule, Take 15 mg by mouth daily., Disp: , Rfl:  .  lisinopril (PRINIVIL,ZESTRIL) 10 MG tablet, TAKE 1 TABLET BY MOUTH  DAILY, Disp: 90 tablet, Rfl: 1 .  loperamide (IMODIUM) 2 MG capsule, Take 1 capsule (2 mg total) by mouth as needed for diarrhea or loose stools., Disp: 12 capsule, Rfl: 0 .  meloxicam (MOBIC) 7.5 MG tablet, TAKE 1 TABLET BY MOUTH  DAILY, Disp: 90 tablet, Rfl: 0 .  metFORMIN (GLUCOPHAGE-XR) 500 MG 24 hr tablet, TAKE 2 TABLETS BY MOUTH  DAILY WITH BREAKFAST, Disp: 180 tablet, Rfl: 1 .   metoprolol (LOPRESSOR) 50 MG tablet, Take 1 tablet (50 mg total) by mouth once as directed., Disp: 1 tablet, Rfl: 0 .  montelukast (SINGULAIR) 10 MG tablet, TAKE 1 TABLET BY MOUTH AT  BEDTIME, Disp: 90 tablet, Rfl: 1 .  Multiple Vitamin (MULTIVITAMIN WITH MINERALS) TABS tablet, Take 1 tablet by mouth daily., Disp: , Rfl:  .  nitroGLYCERIN (NITROSTAT) 0.4 MG SL tablet, Place 1 tablet (0.4 mg total) under the tongue every 5 (five) minutes as needed for chest pain., Disp: 30 tablet, Rfl: 1 .  ONETOUCH VERIO test strip, CHECK BLOOD SUGAR 3 TIMES  DAILY, Disp: 300 each, Rfl: 3 .  PROAIR HFA 108 (90 Base) MCG/ACT inhaler, Inhale 2 puffs into the lungs every 6 (six) hours as needed for wheezing or shortness of breath., Disp: 3 Inhaler, Rfl: 1 .  sertraline (ZOLOFT) 100 MG tablet, TAKE 1 TABLET BY MOUTH  DAILY, Disp: 90 tablet, Rfl: 1 .  tamsulosin (FLOMAX) 0.4 MG CAPS capsule, TAKE 1 CAPSULE BY MOUTH  DAILY, Disp: 90 capsule, Rfl: 1 .  tiZANidine (ZANAFLEX) 2 MG tablet, TAKE 1 TABLET BY MOUTH  EVERY 6 HOURS AS NEEDED FOR MUSCLE SPASM(S), Disp: 270 tablet, Rfl: 2 .  traMADol (ULTRAM) 50 MG tablet, TAKE 1 TABLET BY MOUTH  EVERY 6 HOURS AS NEEDED FOR SEVERE PAIN, Disp: 120 tablet, Rfl: 4 .  traZODone (DESYREL) 100 MG tablet, TAKE 1 TABLET BY MOUTH AT  BEDTIME, Disp: 90 tablet, Rfl: 1

## 2018-03-15 LAB — IGE: IGE (IMMUNOGLOBULIN E), SERUM: 157 [IU]/mL (ref 6–495)

## 2018-03-15 LAB — IGG: IGG (IMMUNOGLOBIN G), SERUM: 1168 mg/dL (ref 700–1600)

## 2018-03-15 LAB — IGM: IGM (IMMUNOGLOBULIN M), SRM: 77 mg/dL (ref 20–172)

## 2018-03-15 LAB — IGA: IGA/IMMUNOGLOBULIN A, SERUM: 210 mg/dL (ref 90–386)

## 2018-03-26 ENCOUNTER — Ambulatory Visit (INDEPENDENT_AMBULATORY_CARE_PROVIDER_SITE_OTHER): Payer: Medicare Other | Admitting: Pulmonary Disease

## 2018-03-26 DIAGNOSIS — J45909 Unspecified asthma, uncomplicated: Secondary | ICD-10-CM

## 2018-03-26 LAB — PULMONARY FUNCTION TEST
DL/VA % pred: 94 %
DL/VA: 4.25 ml/min/mmHg/L
DLCO UNC % PRED: 29 %
DLCO unc: 8.36 ml/min/mmHg
FEF 25-75 PRE: 1.79 L/s
FEF 25-75 Post: 2.34 L/sec
FEF2575-%Change-Post: 30 %
FEF2575-%PRED-PRE: 53 %
FEF2575-%Pred-Post: 69 %
FEV1-%Change-Post: 10 %
FEV1-%PRED-POST: 70 %
FEV1-%Pred-Pre: 63 %
FEV1-POST: 2.58 L
FEV1-Pre: 2.33 L
FEV1FVC-%Change-Post: 7 %
FEV1FVC-%Pred-Pre: 92 %
FEV6-%Change-Post: 2 %
FEV6-%PRED-POST: 73 %
FEV6-%PRED-PRE: 71 %
FEV6-Post: 3.33 L
FEV6-Pre: 3.24 L
FEV6FVC-%CHANGE-POST: 0 %
FEV6FVC-%PRED-POST: 103 %
FEV6FVC-%Pred-Pre: 102 %
FVC-%Change-Post: 2 %
FVC-%Pred-Post: 71 %
FVC-%Pred-Pre: 69 %
FVC-Post: 3.33 L
FVC-Pre: 3.24 L
POST FEV6/FVC RATIO: 100 %
PRE FEV1/FVC RATIO: 72 %
Post FEV1/FVC ratio: 77 %
Pre FEV6/FVC Ratio: 100 %
RV % PRED: 115 %
RV: 2.11 L
TLC % PRED: 80 %
TLC: 5.18 L

## 2018-03-26 NOTE — Progress Notes (Signed)
PFT completed today. 03/26/18

## 2018-04-13 ENCOUNTER — Ambulatory Visit: Payer: Self-pay | Admitting: Pulmonary Disease

## 2018-04-15 ENCOUNTER — Other Ambulatory Visit: Payer: Self-pay | Admitting: Family

## 2018-04-15 DIAGNOSIS — M545 Low back pain, unspecified: Secondary | ICD-10-CM

## 2018-04-17 ENCOUNTER — Other Ambulatory Visit: Payer: Self-pay | Admitting: Family

## 2018-04-17 ENCOUNTER — Ambulatory Visit: Payer: Self-pay | Admitting: Pulmonary Disease

## 2018-04-17 NOTE — Telephone Encounter (Signed)
Last clonazepam RX: 02/10/18, #90 Last Zolpidem RX: Not on current med list. Trazodone on current list. Last OV: 01/31/18 Next OV: 04/20/18 UDS: 09/29/17 and (02/01/18 by pain management) CSC: 09/29/17 CSR: No discrepancies identified

## 2018-04-18 ENCOUNTER — Telehealth: Payer: Self-pay | Admitting: *Deleted

## 2018-04-18 NOTE — Telephone Encounter (Signed)
Ok to send refill  

## 2018-04-18 NOTE — Telephone Encounter (Signed)
Received refill request from [facility] for losartan. I do not see that we have filled this for pt before.  Please advise request?

## 2018-04-19 NOTE — Telephone Encounter (Signed)
Losartan last refilled 6/24, with [facility]. Author phoned pt. to relay that he has refills already on the medication, and pt. Verbalized understanding.

## 2018-04-19 NOTE — Telephone Encounter (Signed)
I would like him to remain off of Ambien.  Refill was sent for clonazepam.

## 2018-04-20 ENCOUNTER — Encounter: Payer: Self-pay | Admitting: Family

## 2018-04-20 ENCOUNTER — Ambulatory Visit (INDEPENDENT_AMBULATORY_CARE_PROVIDER_SITE_OTHER): Payer: Medicare Other | Admitting: Family

## 2018-04-20 VITALS — BP 103/70 | HR 82 | Temp 98.2°F | Resp 16 | Ht 68.0 in | Wt 189.0 lb

## 2018-04-20 DIAGNOSIS — E119 Type 2 diabetes mellitus without complications: Secondary | ICD-10-CM

## 2018-04-20 DIAGNOSIS — G4700 Insomnia, unspecified: Secondary | ICD-10-CM | POA: Diagnosis not present

## 2018-04-20 DIAGNOSIS — R350 Frequency of micturition: Secondary | ICD-10-CM

## 2018-04-20 DIAGNOSIS — F418 Other specified anxiety disorders: Secondary | ICD-10-CM

## 2018-04-20 DIAGNOSIS — E785 Hyperlipidemia, unspecified: Secondary | ICD-10-CM

## 2018-04-20 DIAGNOSIS — I10 Essential (primary) hypertension: Secondary | ICD-10-CM

## 2018-04-20 DIAGNOSIS — N401 Enlarged prostate with lower urinary tract symptoms: Secondary | ICD-10-CM

## 2018-04-20 LAB — BASIC METABOLIC PANEL
BUN: 18 mg/dL (ref 6–23)
CO2: 31 mEq/L (ref 19–32)
Calcium: 9.3 mg/dL (ref 8.4–10.5)
Chloride: 101 mEq/L (ref 96–112)
Creatinine, Ser: 0.91 mg/dL (ref 0.40–1.50)
GFR: 94.74 mL/min (ref >60.00)
Glucose, Bld: 97 mg/dL (ref 70–99)
POTASSIUM: 4 meq/L (ref 3.5–5.1)
SODIUM: 138 meq/L (ref 135–145)

## 2018-04-20 LAB — HEMOGLOBIN A1C: HEMOGLOBIN A1C: 5.6 % (ref 4.6–6.5)

## 2018-04-20 MED ORDER — LOSARTAN POTASSIUM 25 MG PO TABS: 25.0000 mg | ORAL_TABLET | Freq: Every day | ORAL | 1 refills | Status: DC

## 2018-04-20 MED ORDER — METFORMIN HCL ER 500 MG PO TB24: 500.0000 mg | ORAL_TABLET | Freq: Every day | ORAL | 1 refills | Status: DC

## 2018-04-20 NOTE — Progress Notes (Signed)
Subjective:    Patient ID: Cody Raymond, male    DOB: Feb 03, 1971, 47 y.o.   MRN: [id number]  HPI  Patient is a 47 year old male who presents today for routine follow-up.  Depression/anxiety- He is maintained on Zoloft and clonazepam.  Reports overall mood is good. He takes clonazepam nightly and occasionally during the day.   Diabetes- he is maintained on metformin. Reports sugars post prandial 150, 95-100 in AM.     Lab Results  Component Value Date   HGBA1C 5.6 01/10/2018   HGBA1C 5.7 07/25/2017   HGBA1C 5.4 04/24/2017   Lab Results  Component Value Date   MICROALBUR 1.15 11/20/2013   LDLCALC 122 (H) 04/17/2015   CREATININE 0.78 01/10/2018   HTN-he is maintained on losartan, metoprolol BP Readings from Last 3 Encounters:  04/20/18 103/70  03/12/18 118/70  02/01/18 110/72   Asthma-the patient is followed by Dr. Lake Bells from pulmonology.   Insomnia-he continues trazodone. Reports that he went to bed last night at 11 PM, woke up 4 times last night.    BPH- maintained on flomax which is managed by urology.   Hyperlipidemia- maintained on fenofibrate, atorvastatin, zetia.   Lab Results  Component Value Date   CHOL 149 01/10/2018   HDL 40.60 01/10/2018   LDLCALC 122 (H) 04/17/2015   LDLDIRECT 74.0 01/10/2018   TRIG 219.0 (H) 01/10/2018   CHOLHDL 4 01/10/2018     Review of Systems See HPI  Past Medical History:  Diagnosis Date  . Aneurysm ([place])   . Asthma   . Blood transfusion   . CAD (coronary artery disease), non obstructive on cath 2011 04/05/2012  . Coronary artery disease   . Diabetes mellitus   . Enlarged prostate   . GERD (gastroesophageal reflux disease)   . H/O syncope   . Heart disease   . History of repair of patent ductus arteriosus 07/23/2016  . Hyperlipidemia   . Hypertension   . 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 Refusal of blood transfusions as patient is Jehov[employer]'s Witness      Social History   Socioeconomic History  .  Marital status: Married    Spouse name: Not on file  . Number of children: Not on file  . Years of education: Not on file  . Highest education level: Not on file  Occupational History  . Not on file  Social Needs  . Financial resource strain: Not on file  . Food insecurity:    Worry: Not on file    Inability: Not on file  . Transportation needs:    Medical: Not on file    Non-medical: Not on file  Tobacco Use  . Smoking status: Never Smoker  . Smokeless tobacco: Never Used  Substance and Sexual Activity  . Alcohol use: No  . Drug use: No  . Sexual activity: Not on file  Lifestyle  . Physical activity:    Days per week: Not on file    Minutes per session: Not on file  . Stress: Not on file  Relationships  . Social connections:    Talks on phone: Not on file    Gets together: Not on file    Attends religious service: Not on file    Active member of club or organization: Not on file    Attends meetings of clubs or organizations: Not on file    Relationship status: Not on file  . Intimate partner violence:    Fear of current or  ex partner: Not on file    Emotionally abused: Not on file    Physically abused: Not on file    Forced sexual activity: Not on file  Other Topics Concern  . Not on file  Social History Narrative   Holter monitor 08/2010: PVCs and sinus tachy.   Sleep Study (02/2008): mild sleep apnea, no indication for CPAP.    Past Surgical History:  Procedure Laterality Date  . APPENDECTOMY    . BACK SURGERY    . CARDIAC CATHETERIZATION  2007   Clean Cardiac Cath ([place]), with RCA 30% narrowing likely due to catheter induced spasm in 09/02/10.  Marland Kitchen CARDIAC CATHETERIZATION  09/02/2010   mod. nonobstructive disease in the RCA and CX, tortuous LAD  . COLONOSCOPY W/ POLYPECTOMY  03/17/11   diminutive polyp  . LOOP RECORDER EXPLANT N/A 01/14/2014   Procedure: LOOP RECORDER EXPLANT;  Surgeon: Sanda Klein, MD;  Location: [facility];  Service:  Cardiovascular;  Laterality: N/A;  . LOOP RECORDER IMPLANT  04/06/2012   Reveal XT 0539  . LOOP RECORDER IMPLANT N/A 04/06/2012   Procedure: LOOP RECORDER IMPLANT;  Surgeon: Sanda Klein, MD;  Location: [facility];  Service: Cardiovascular;  Laterality: N/A;  . NM MYOCAR PERF WALL MOTION  01/25/2008   mild anteroapical wall ischemia  . PATENT DUCTUS ARTERIOUS REPAIR     at age 45    Family History  Problem Relation Age of Onset  . Colon cancer Paternal Grandfather   . Prostate cancer Paternal Grandfather   . Aneurysm Father   . Heart attack Father 24  . Coronary artery disease Father   . Colon polyps Mother   . Heart disease Mother   . Coronary artery disease Mother   . Migraines Mother   . Breast cancer Maternal Grandmother   . Colon cancer Paternal Uncle        x 2  . Stomach cancer Brother   . Liver disease Unknown        unsure who it was  . Allergies Daughter     Allergies  Allergen Reactions  . Benadryl [Diphenhydramine Hcl]     "drives me nuts"    Current Outpatient Medications on File Prior to Visit  Medication Sig Dispense Refill  . albuterol (PROVENTIL) (2.5 MG/3ML) 0.083% nebulizer solution USE 1 VIAL VIA NEBULIZER  EVERY 6 HOURS AS NEEDED FOR WHEEZING OR SHORTNESS OF  BREATH 1125 mL 1  . aspirin 325 MG tablet Take 325 mg by mouth daily.    Marland Kitchen atorvastatin (LIPITOR) 80 MG tablet TAKE 1 TABLET BY MOUTH  DAILY 90 tablet 1  . cetirizine (ZYRTEC) 10 MG tablet Take 10 mg by mouth daily.    . clonazePAM (KLONOPIN) 1 MG tablet TAKE 1 TABLET BY MOUTH  DAILY 90 tablet 0  . diclofenac sodium (VOLTAREN) 1 % GEL Apply 2 g topically 4 (four) times daily as needed (pain). 720 g 0  . ezetimibe (ZETIA) 10 MG tablet TAKE 1 TABLET BY MOUTH  DAILY 90 tablet 1  . fenofibrate (TRICOR) 145 MG tablet Take 1 tablet by mouth  daily 90 tablet 1  . fish oil-omega-3 fatty acids 1000 MG capsule Take 2 g by mouth daily.     . Fluticasone-Salmeterol (AIRDUO RESPICLICK 767/34) 193-79 MCG/ACT  AEPB Inhale 1 puff into the lungs 2 (two) times daily. 1 each 3  . gabapentin (NEURONTIN) 300 MG capsule TAKE 1 CAPSULE BY MOUTH AT  6AM, 11:30AM AND 4:30PM AND TAKE 3  CAPSULES AT 10:00PM 540 capsule 1  . isosorbide mononitrate (IMDUR) 30 MG 24 hr tablet Take 1 tablet (30 mg total) by mouth daily. 30 tablet 0  . ketoconazole (NIZORAL) 2 % shampoo APPLY TOPICALLY DAILY 360 mL 1  . lansoprazole (PREVACID) 15 MG capsule Take 15 mg by mouth daily.    Marland Kitchen loperamide (IMODIUM) 2 MG capsule Take 1 capsule (2 mg total) by mouth as needed for diarrhea or loose stools. 12 capsule 0  . losartan (COZAAR) 25 MG tablet Take 1 tablet (25 mg total) by mouth daily. 90 tablet 1  . meloxicam (MOBIC) 7.5 MG tablet TAKE 1 TABLET BY MOUTH  DAILY 90 tablet 0  . metFORMIN (GLUCOPHAGE-XR) 500 MG 24 hr tablet TAKE 2 TABLETS BY MOUTH  DAILY WITH BREAKFAST 180 tablet 1  . metoprolol (LOPRESSOR) 50 MG tablet Take 1 tablet (50 mg total) by mouth once as directed. 1 tablet 0  . montelukast (SINGULAIR) 10 MG tablet TAKE 1 TABLET BY MOUTH AT  BEDTIME 90 tablet 1  . Multiple Vitamin (MULTIVITAMIN WITH MINERALS) TABS tablet Take 1 tablet by mouth daily.    . nitroGLYCERIN (NITROSTAT) 0.4 MG SL tablet Place 1 tablet (0.4 mg total) under the tongue every 5 (five) minutes as needed for chest pain. 30 tablet 1  . ONETOUCH VERIO test strip CHECK BLOOD SUGAR 3 TIMES  DAILY 300 each 3  . PROAIR HFA 108 (90 Base) MCG/ACT inhaler Inhale 2 puffs into the lungs every 6 (six) hours as needed for wheezing or shortness of breath. 3 Inhaler 1  . sertraline (ZOLOFT) 100 MG tablet TAKE 1 TABLET BY MOUTH  DAILY 90 tablet 1  . tamsulosin (FLOMAX) 0.4 MG CAPS capsule TAKE 1 CAPSULE BY MOUTH  DAILY 90 capsule 1  . tiZANidine (ZANAFLEX) 2 MG tablet TAKE 1 TABLET BY MOUTH  EVERY 6 HOURS AS NEEDED FOR MUSCLE SPASM(S) 270 tablet 2  . traMADol (ULTRAM) 50 MG tablet TAKE 1 TABLET BY MOUTH  EVERY 6 HOURS AS NEEDED FOR SEVERE PAIN 120 tablet 4  . traZODone  (DESYREL) 100 MG tablet TAKE 1 TABLET BY MOUTH AT  BEDTIME 90 tablet 1  . [DISCONTINUED] niacin (NIASPAN) 1000 MG CR tablet Take 1 tablet (1,000 mg total) by mouth at bedtime. 30 tablet 6   No current facility-administered medications on file prior to visit.     BP 103/70 (BP Location: Left Arm, Cuff Size: Normal)   Pulse 82   Temp 98.2 F (36.8 C) (Oral)   Resp 16   Ht 5\' 8"  (1.727 m)   Wt 189 lb (85.7 kg)   SpO2 99%   BMI 28.74 kg/m       Objective:   Physical Exam  Constitutional: He is oriented to person, place, and time. He appears well-developed and well-nourished. No distress.  HENT:  Head: Normocephalic and atraumatic.  Cardiovascular: Normal rate and regular rhythm.  No murmur heard. Pulmonary/Chest: Effort normal and breath sounds normal. No respiratory distress. He has no wheezes. He has no rales.  Musculoskeletal: He exhibits no edema.  Neurological: He is alert and oriented to person, place, and time.  Skin: Skin is warm and dry.  Psychiatric: He has a normal mood and affect. His behavior is normal. Thought content normal.          Assessment & Plan:  Depresssion/anxiety- stable on current meds. Continue same.   DM2- excellent control. Will decrease metformin from 1000mg  daily to 500mg  daily.   HTN- bp stable  on current regimen, continue same.   Insomnia- fair control, continue current regimen.   Hyperlipidemia- LDL at goal, continue current regimen.  BPH- management per urology. Stable on flomax.

## 2018-04-20 NOTE — Telephone Encounter (Signed)
Notified pt and he voices understanding. 

## 2018-04-20 NOTE — Patient Instructions (Addendum)
Please decrease metformin to one tab once daily.  Please complete lab work prior to leaving.

## 2018-04-20 NOTE — Addendum Note (Signed)
Addended by: Kelle Darting A on: 04/20/2018 07:29 AM   Modules accepted: Orders

## 2018-04-20 NOTE — Telephone Encounter (Signed)
Previous refills went to local pharmacy. Rx sent to [facility].

## 2018-05-14 ENCOUNTER — Other Ambulatory Visit: Payer: Self-pay | Admitting: Cardiology

## 2018-05-14 DIAGNOSIS — I712 Thoracic aortic aneurysm, without rupture, unspecified: Secondary | ICD-10-CM

## 2018-05-16 ENCOUNTER — Ambulatory Visit: Payer: Self-pay | Admitting: Pulmonary Disease

## 2018-05-19 ENCOUNTER — Encounter: Payer: Self-pay | Admitting: Family

## 2018-05-19 ENCOUNTER — Other Ambulatory Visit: Payer: Self-pay | Admitting: Family

## 2018-05-22 MED ORDER — LISINOPRIL 10 MG PO TABS: 10.0000 mg | ORAL_TABLET | Freq: Every day | ORAL | 3 refills | Status: DC

## 2018-05-23 ENCOUNTER — Other Ambulatory Visit: Payer: Self-pay

## 2018-05-29 ENCOUNTER — Ambulatory Visit
Admission: RE | Admit: 2018-05-29 | Discharge: 2018-05-29 | Disposition: A | Discharge status: Home / Self Care | Payer: Medicare Other | Source: Ambulatory Visit | Attending: Cardiology | Admitting: Cardiology

## 2018-05-29 DIAGNOSIS — I712 Thoracic aortic aneurysm, without rupture, unspecified: Secondary | ICD-10-CM

## 2018-05-29 MED ORDER — IOPAMIDOL (ISOVUE-370) INJECTION 76%
75.0000 mL | Freq: Once | INTRAVENOUS | Status: AC | PRN
  Administered 2018-05-29: 75 mL via INTRAVENOUS

## 2018-06-08 ENCOUNTER — Ambulatory Visit: Payer: Self-pay | Admitting: Pulmonary Disease

## 2018-06-14 ENCOUNTER — Other Ambulatory Visit: Payer: Self-pay | Admitting: Family

## 2018-06-14 DIAGNOSIS — E785 Hyperlipidemia, unspecified: Secondary | ICD-10-CM

## 2018-06-14 DIAGNOSIS — J45909 Unspecified asthma, uncomplicated: Secondary | ICD-10-CM

## 2018-06-14 DIAGNOSIS — M545 Low back pain, unspecified: Secondary | ICD-10-CM

## 2018-06-14 DIAGNOSIS — E119 Type 2 diabetes mellitus without complications: Secondary | ICD-10-CM

## 2018-06-17 ENCOUNTER — Emergency Department (HOSPITAL_COMMUNITY): Payer: Medicare Other

## 2018-06-17 ENCOUNTER — Encounter (HOSPITAL_COMMUNITY): Payer: Self-pay

## 2018-06-17 ENCOUNTER — Other Ambulatory Visit: Payer: Self-pay

## 2018-06-17 ENCOUNTER — Emergency Department (HOSPITAL_COMMUNITY)
Admission: EM | Admit: 2018-06-17 | Discharge: 2018-06-17 | Disposition: A | Discharge status: Home / Self Care | Payer: Medicare Other | Attending: Emergency Medicine | Admitting: Emergency Medicine

## 2018-06-17 DIAGNOSIS — E1140 Type 2 diabetes mellitus with diabetic neuropathy, unspecified: Secondary | ICD-10-CM | POA: Diagnosis not present

## 2018-06-17 DIAGNOSIS — I10 Essential (primary) hypertension: Secondary | ICD-10-CM | POA: Insufficient documentation

## 2018-06-17 DIAGNOSIS — Z79899 Other long term (current) drug therapy: Secondary | ICD-10-CM | POA: Diagnosis not present

## 2018-06-17 DIAGNOSIS — Z7984 Long term (current) use of oral hypoglycemic drugs: Secondary | ICD-10-CM | POA: Diagnosis not present

## 2018-06-17 DIAGNOSIS — R110 Nausea: Secondary | ICD-10-CM | POA: Diagnosis not present

## 2018-06-17 DIAGNOSIS — R0789 Other chest pain: Secondary | ICD-10-CM | POA: Insufficient documentation

## 2018-06-17 DIAGNOSIS — J45909 Unspecified asthma, uncomplicated: Secondary | ICD-10-CM | POA: Diagnosis not present

## 2018-06-17 DIAGNOSIS — I2510 Atherosclerotic heart disease of native coronary artery without angina pectoris: Secondary | ICD-10-CM | POA: Insufficient documentation

## 2018-06-17 DIAGNOSIS — R079 Chest pain, unspecified: Secondary | ICD-10-CM | POA: Diagnosis present

## 2018-06-17 DIAGNOSIS — Z7982 Long term (current) use of aspirin: Secondary | ICD-10-CM | POA: Diagnosis not present

## 2018-06-17 DIAGNOSIS — R42 Dizziness and giddiness: Secondary | ICD-10-CM | POA: Diagnosis not present

## 2018-06-17 DIAGNOSIS — R61 Generalized hyperhidrosis: Secondary | ICD-10-CM | POA: Diagnosis not present

## 2018-06-17 DIAGNOSIS — R202 Paresthesia of skin: Secondary | ICD-10-CM | POA: Insufficient documentation

## 2018-06-17 LAB — I-STAT TROPONIN, ED
Troponin i, poc: 0 ng/mL (ref 0.00–0.08)
Troponin i, poc: 0 ng/mL (ref 0.00–0.08)

## 2018-06-17 LAB — BASIC METABOLIC PANEL
ANION GAP: 9 (ref 5–15)
BUN: 13 mg/dL (ref 6–20)
CO2: 23 mmol/L (ref 22–32)
Calcium: 8.8 mg/dL — ABNORMAL LOW (ref 8.9–10.3)
Chloride: 104 mmol/L (ref 98–111)
Creatinine, Ser: 0.8 mg/dL (ref 0.61–1.24)
GFR calc Af Amer: >60 mL/min (ref >60)
Glucose, Bld: 108 mg/dL — ABNORMAL HIGH (ref 70–99)
POTASSIUM: 3.9 mmol/L (ref 3.5–5.1)
SODIUM: 136 mmol/L (ref 135–145)

## 2018-06-17 LAB — CBC
HEMATOCRIT: 43.3 % (ref 39.0–52.0)
HEMOGLOBIN: 14 g/dL (ref 13.0–17.0)
MCH: 26.1 pg (ref 26.0–34.0)
MCHC: 32.3 g/dL (ref 30.0–36.0)
MCV: 80.6 fL (ref 78.0–100.0)
Platelets: 278 10*3/uL (ref 150–400)
RBC: 5.37 MIL/uL (ref 4.22–5.81)
RDW: 15.9 % — AB (ref 11.5–15.5)
WBC: 5.6 10*3/uL (ref 4.0–10.5)

## 2018-06-17 MED ORDER — MECLIZINE HCL 25 MG PO TABS
25.0000 mg | ORAL_TABLET | Freq: Once | ORAL | Status: AC
  Administered 2018-06-17: 25 mg via ORAL
  Filled 2018-06-17: qty 1

## 2018-06-17 MED ORDER — GI COCKTAIL ~~LOC~~
30.0000 mL | Freq: Once | ORAL | Status: AC
  Administered 2018-06-17: 30 mL via ORAL
  Filled 2018-06-17: qty 30

## 2018-06-17 MED ORDER — MECLIZINE HCL 25 MG PO TABS: 25.0000 mg | ORAL_TABLET | Freq: Three times a day (TID) | ORAL | 0 refills | Status: DC | PRN

## 2018-06-17 MED ORDER — SODIUM CHLORIDE 0.9 % IV BOLUS
1000.0000 mL | Freq: Once | INTRAVENOUS | Status: AC
  Administered 2018-06-17: 1000 mL via INTRAVENOUS

## 2018-06-17 MED ORDER — PROCHLORPERAZINE EDISYLATE 10 MG/2ML IJ SOLN
10.0000 mg | Freq: Once | INTRAMUSCULAR | Status: AC
  Administered 2018-06-17: 10 mg via INTRAVENOUS
  Filled 2018-06-17: qty 2

## 2018-06-17 NOTE — ED Notes (Addendum)
Patient transported to CT 

## 2018-06-17 NOTE — ED Provider Notes (Signed)
[facility] EMERGENCY DEPARTMENT Provider Note   CSN: [id number] Arrival date & time: 06/17/18  0854     History   Chief Complaint Chief Complaint  Patient presents with  . Chest Pain    HPI Cody Raymond is a 47 y.o. male.  47 yo M with a cc of chest pain.  Started 5 days ago.  Constant.  Feels like pressure and sharp.  Feels some tingling to the LUE with radiation tot he shoulder.  Tried nitro at home with minimal relief.  Has some nausea and one episode of diaphoresis. Also been feeling dizzy.  Feels like the room is spinning.  After getting up from going to the bathroom fell and hit his head.  +LOC per wife.  Palpitations prior to event.    The patient states that he felt multiple times like he might pass out.  He describes that as a sensation like the world is spinning so bad that he might lose his balance and ended up on the ground.  He is not sure exactly what happened this morning when he got up from the bathroom.  He is complaining mostly of chest pain and has trouble describing it.  He is also having tingling down the left arm.  He took multiple sleeping pills last night to try and get some rest.  The history is provided by the patient.  Chest Pain   This is a recurrent problem. The current episode started more than 2 days ago. The problem occurs constantly. The problem has not changed since onset.The pain is present in the substernal region. The pain is at a severity of 4/10. The pain is mild. The quality of the pain is described as brief. The pain does not radiate. Duration of episode(s) is 5 days. Associated symptoms include dizziness. Pertinent negatives include no abdominal pain, no fever, no headaches, no palpitations, no shortness of breath and no vomiting. He has tried nothing for the symptoms. The treatment provided no relief.  His past medical history is significant for diabetes, hyperlipidemia and hypertension.  Pertinent negatives for past medical  history include no DVT, no MI and no PE.    Past Medical History:  Diagnosis Date  . Aneurysm ([place])   . Asthma   . Blood transfusion   . CAD (coronary artery disease), non obstructive on cath 2011 04/05/2012  . Coronary artery disease   . Diabetes mellitus   . Enlarged prostate   . GERD (gastroesophageal reflux disease)   . H/O syncope   . Heart disease   . History of repair of patent ductus arteriosus 07/23/2016  . Hyperlipidemia   . Hypertension   . 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 Refusal of blood transfusions as patient is Jehovah's Witness     Patient Active Problem List   Diagnosis Date Noted  . Right shoulder injury, initial encounter 06/18/2017  . 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 Trochanteric bursitis of right hip 10/17/2016  . History of repair of patent ductus arteriosus 07/23/2016  . Cervicalgia 07/08/2016  . Fibromyalgia 07/08/2016  . Cervical spondylosis without myelopathy 07/08/2016  . HTN (hypertension) 02/08/2016  . Lumbar facet arthropathy 10/19/2015  . Chronic lumbar radiculopathy 06/22/2015  . History of syncope 10/20/2014  . Urinary frequency 07/08/2014  . Allergic rhinitis 07/08/2014  . Lumbar radiculopathy, chronic 07/26/2013  . Morton's neuroma of left foot 03/18/2013  . Depression with anxiety 12/21/2012  . Insomnia 07/23/2012  . CAD (coronary artery disease),  non obstructive on cath 2011 04/05/2012  . Right bundle branch block 04/04/2012  . Peripheral neuropathy 04/04/2012  . Chronic back pain 03/28/2011  . Palpitations 03/01/2011  . Type 2 diabetes, controlled, with peripheral neuropathy ([place]) 09/07/2010  . GERD 09/07/2010  . Coronary artery spasm ([place]) 01/31/2008  . OBESITY, NOS 11/16/2006  . ERECTILE DYSFUNCTION 11/16/2006  . Asthma with acute exacerbation 11/16/2006    Past Surgical History:  Procedure Laterality Date  . APPENDECTOMY    . BACK SURGERY    . CARDIAC CATHETERIZATION  2007   Clean Cardiac Cath  ([place]), with RCA 30% narrowing likely due to catheter induced spasm in 09/02/10.  [facility] CARDIAC CATHETERIZATION  09/02/2010   mod. nonobstructive disease in the RCA and CX, tortuous LAD  . COLONOSCOPY W/ POLYPECTOMY  03/17/11   diminutive polyp  . LOOP RECORDER EXPLANT N/A 01/14/2014   Procedure: LOOP RECORDER EXPLANT;  Surgeon: Sanda Klein, MD;  Location: [facility] CATH LAB;  Service: Cardiovascular;  Laterality: N/A;  . LOOP RECORDER IMPLANT  04/06/2012   Reveal XT 9924  . LOOP RECORDER IMPLANT N/A 04/06/2012   Procedure: LOOP RECORDER IMPLANT;  Surgeon: Sanda Klein, MD;  Location: [facility] CATH LAB;  Service: Cardiovascular;  Laterality: N/A;  . NM MYOCAR PERF WALL MOTION  01/25/2008   mild anteroapical wall ischemia  . PATENT DUCTUS ARTERIOUS REPAIR     at age 63        Home Medications    Prior to Admission medications   Medication Sig Start Date End Date Taking? Authorizing Provider  albuterol (PROVENTIL) (2.5 MG/3ML) 0.083% nebulizer solution USE 1 VIAL VIA NEBULIZER  EVERY 6 HOURS AS NEEDED FOR WHEEZING OR SHORTNESS OF  BREATH Patient taking differently: Take 2.5 mg by nebulization every 6 (six) hours as needed for wheezing or shortness of breath.  04/17/18  Yes [name], NP  aspirin 81 MG tablet Take 81 mg by mouth daily.    Yes [provider]  atorvastatin (LIPITOR) 80 MG tablet TAKE 1 TABLET BY MOUTH  DAILY 02/05/18  Yes [name], NP  cetirizine (ZYRTEC) 10 MG tablet Take 10 mg by mouth daily.   Yes [provider]  clonazePAM (KLONOPIN) 1 MG tablet TAKE 1 TABLET BY MOUTH  DAILY 04/19/18  Yes [name], NP  diclofenac sodium (VOLTAREN) 1 % GEL Apply 2 g topically 4 (four) times daily as needed (pain). 10/04/17  Yes [name], NP  ezetimibe (ZETIA) 10 MG tablet TAKE 1 TABLET BY MOUTH  DAILY 03/06/18  Yes [name], NP  fenofibrate (TRICOR) 145 MG tablet Take 1 tablet by mouth  daily 08/10/15  Yes [name], NP  fluticasone (FLONASE) 50 MCG/ACT nasal spray Place 2 sprays into both nostrils as needed for allergies or rhinitis.   Yes [provider]  gabapentin (NEURONTIN) 300 MG capsule TAKE 1 CAPSULE BY MOUTH AT  6AM, 11:30AM AND 4:30PM AND TAKE 3 CAPSULES AT 10:00PM Patient taking differently: Take 300-900 mg by mouth See admin instructions. Take 1 capsule (300mg ) at 6AM, 11:30 AM and 4:30pm and 3 capsules (900mg ) at 10:00pm 02/05/18  Yes [name], NP  ibuprofen (IBU) 800 MG tablet Take 800 mg by mouth every 6 (six) hours as needed for pain.   Yes [provider]  isosorbide mononitrate (IMDUR) 30 MG 24 hr tablet Take 1 tablet (30 mg total) by mouth daily. 10/26/16  Yes Hedges, Dellis Filbert, PA-C  ketoconazole (NIZORAL) 2 % shampoo APPLY TOPICALLY DAILY 05/22/18  Yes  [name], NP  lisinopril (PRINIVIL,ZESTRIL) 10 MG tablet Take 1 tablet (10 mg total) by mouth daily. 05/22/18  Yes [name], NP  meloxicam (MOBIC) 7.5 MG tablet TAKE 1 TABLET BY MOUTH  DAILY 04/16/18  Yes [name], NP  metFORMIN (GLUCOPHAGE-XR) 500 MG 24 hr tablet Take 1 tablet (500 mg total) by mouth daily with breakfast. 04/20/18  Yes [name], NP  montelukast (SINGULAIR) 10 MG tablet TAKE 1 TABLET BY MOUTH AT  BEDTIME Patient taking differently: Take 10 mg by mouth at bedtime.  02/05/18  Yes [name], NP  Multiple Vitamin (MULTIVITAMIN WITH MINERALS) TABS tablet Take 1 tablet by mouth daily.   Yes [provider]  nitroGLYCERIN (NITROSTAT) 0.4 MG SL tablet Place 1 tablet (0.4 mg total) under the tongue every 5 (five) minutes as needed for chest pain. 11/11/15  Yes [name], NP  PROAIR HFA 108 [phone] Base) MCG/ACT inhaler Inhale 2 puffs into the lungs every 6 (six) hours as needed for wheezing or shortness of breath. 02/08/18  Yes [name], NP  sertraline (ZOLOFT) 100 MG tablet TAKE 1 TABLET BY MOUTH  DAILY Patient taking differently:  Take 100 mg by mouth daily.  05/22/18  Yes [name], NP  tamsulosin (FLOMAX) 0.4 MG CAPS capsule TAKE 1 CAPSULE BY MOUTH  DAILY Patient taking differently: Take 0.4 mg by mouth daily.  05/22/18  Yes [name], NP  traMADol (ULTRAM) 50 MG tablet TAKE 1 TABLET BY MOUTH  EVERY 6 HOURS AS NEEDED FOR SEVERE PAIN Patient taking differently: Take 50 mg by mouth every 6 (six) hours as needed for severe pain. TAKE 1 TABLET BY MOUTH  EVERY 6 HOURS AS NEEDED FOR SEVERE PAIN 02/01/18  Yes Kirsteins, Luanna Salk, MD  traZODone (DESYREL) 100 MG tablet TAKE 1 TABLET BY MOUTH AT  BEDTIME Patient taking differently: Take 100 mg by mouth at bedtime.  02/05/18  Yes [name], NP  Fluticasone-Salmeterol (AIRDUO RESPICLICK 732/20) 254-27 MCG/ACT AEPB Inhale 1 puff into the lungs 2 (two) times daily. Patient not taking: Reported on 06/17/2018 03/12/18   Juanito Doom, MD  loperamide (IMODIUM) 2 MG capsule Take 1 capsule (2 mg total) by mouth as needed for diarrhea or loose stools. Patient not taking: Reported on 06/17/2018 01/20/16   Horton, Barbette Hair, MD  meclizine (ANTIVERT) 25 MG tablet Take 1 tablet (25 mg total) by mouth 3 (three) times daily as needed for dizziness. 06/17/18   Deno Etienne, DO  metoprolol (LOPRESSOR) 50 MG tablet Take 1 tablet (50 mg total) by mouth once as directed. 11/04/16   Croitoru, Mihai, MD  ONETOUCH VERIO test strip CHECK BLOOD SUGAR 3 TIMES  DAILY 11/28/17   [name], NP  tiZANidine (ZANAFLEX) 2 MG tablet TAKE 1 TABLET BY MOUTH  EVERY 6 HOURS AS NEEDED FOR MUSCLE SPASM(S) Patient taking differently: Take 2 mg by mouth every 6 (six) hours as needed for muscle spasms.  03/06/18   Kirsteins, Luanna Salk, MD  niacin (NIASPAN) 1000 MG CR tablet Take 1 tablet (1,000 mg total) by mouth at bedtime. 07/27/11 11/08/17  Burnice Logan, MD    Family History Family History  Problem Relation Age of Onset  . Colon cancer Paternal Grandfather   . Prostate cancer Paternal  Grandfather   . Aneurysm Father   . Heart attack Father 75  . Coronary artery disease Father   . Colon polyps Mother   . Heart disease Mother   . Coronary artery disease Mother   .  Migraines Mother   . Breast cancer Maternal Grandmother   . Colon cancer Paternal Uncle        x 2  . Stomach cancer Brother   . Liver disease Unknown        unsure who it was  . Allergies Daughter     Social History Social History   Tobacco Use  . Smoking status: Never Smoker  . Smokeless tobacco: Never Used  Substance Use Topics  . Alcohol use: No  . Drug use: No     Allergies   Benadryl [diphenhydramine hcl]   Review of Systems Review of Systems  Constitutional: Negative for chills and fever.  HENT: Negative for congestion and facial swelling.   Eyes: Negative for discharge and visual disturbance.  Respiratory: Negative for shortness of breath.   Cardiovascular: Positive for chest pain. Negative for palpitations.  Gastrointestinal: Negative for abdominal pain, diarrhea and vomiting.  Musculoskeletal: Negative for arthralgias and myalgias.  Skin: Negative for color change and rash.  Neurological: Positive for dizziness and syncope. Negative for tremors and headaches.  Psychiatric/Behavioral: Negative for confusion and dysphoric mood.     Physical Exam Updated Vital Signs BP 131/80   Pulse 92   Temp 98.4 F (36.9 C) (Oral)   Resp 15   SpO2 100%   Physical Exam  Constitutional: He is oriented to person, place, and time. He appears well-developed and well-nourished.  HENT:  Head: Normocephalic and atraumatic.  Eyes: Pupils are equal, round, and reactive to light. EOM are normal.  Neck: Normal range of motion. Neck supple. No JVD present.  Cardiovascular: Normal rate and regular rhythm. Exam reveals no gallop and no friction rub.  No murmur heard. Pulmonary/Chest: No respiratory distress. He has no wheezes.  Abdominal: He exhibits no distension. There is tenderness (mild  epigastric). There is no rebound and no guarding.  Musculoskeletal: Normal range of motion.  Neurological: He is alert and oriented to person, place, and time. He has normal strength. No cranial nerve deficit or sensory deficit. Coordination normal. GCS eye subscore is 4. GCS verbal subscore is 5. GCS motor subscore is 6.  Skin: No rash noted. No pallor.  Psychiatric: He has a normal mood and affect. His behavior is normal.  Nursing note and vitals reviewed.    ED Treatments / Results  Labs (all labs ordered are listed, but only abnormal results are displayed) Labs Reviewed  BASIC METABOLIC PANEL - Abnormal; Notable for the following components:      Result Value   Glucose, Bld 108 (*)    Calcium 8.8 (*)    All other components within normal limits  CBC - Abnormal; Notable for the following components:   RDW 15.9 (*)    All other components within normal limits  I-STAT TROPONIN, ED  I-STAT TROPONIN, ED    EKG EKG Interpretation  Date/Time:  Sunday June 17 2018 09:01:53 EDT Ventricular Rate:  94 PR Interval:    QRS Duration: 97 QT Interval:  353 QTC Calculation: 442 R Axis:   -110 Text Interpretation:  Age not entered, assumed to be  47 years old for purpose of ECG interpretation Sinus rhythm Right superior axis Consider right ventricular hypertrophy similar to 11/09/16 Confirmed by Deno Etienne [phone]) on 06/17/2018 9:13:29 AM Also confirmed by Deno Etienne [phone]), editor Philomena Doheny [phone])  on 06/17/2018 12:38:47 PM   Radiology Dg Chest 2 View  Result Date: 06/17/2018 CLINICAL DATA:  Chest pain.  Dizziness EXAM: CHEST - 2 VIEW COMPARISON:  Chest CT 05/29/2018 FINDINGS: Normal mediastinum and cardiac silhouette. Normal pulmonary vasculature. No evidence of effusion, infiltrate, or pneumothorax. No acute bony abnormality. IMPRESSION: No acute cardiopulmonary process. Electronically Signed   By: Suzy Bouchard M.D.   On: 06/17/2018 10:06   Ct Head Wo Contrast  Result  Date: 06/17/2018 CLINICAL DATA:  Dizziness and headache with near syncope and nausea EXAM: CT HEAD WITHOUT CONTRAST TECHNIQUE: Contiguous axial images were obtained from the base of the skull through the vertex without intravenous contrast. COMPARISON:  April 04, 2012 FINDINGS: Brain: The ventricles are normal in size and configuration. There is no intracranial mass, hemorrhage, extra-axial fluid collection, or midline shift. Brain parenchyma appears normal. No acute infarct evident. Vascular: No hyperdense vessel. There is no appreciable vascular calcification. Skull: Bony calvarium appears intact. Sinuses/Orbits: There is a retention cyst, incompletely visualized, arising from the medial left maxillary antrum. Other visualized paranasal sinuses are clear. There is deviation of the nasal septum toward the left posteriorly. Orbits appear symmetric bilaterally. Other: Mastoid air cells are clear. IMPRESSION: No acute infarct evident.  No mass or hemorrhage. Retention cyst in left maxillary antrum.  Deviated nasal septum. Electronically Signed   By: Lowella Grip III M.D.   On: 06/17/2018 11:02    Procedures Procedures (including critical care time)  Medications Ordered in ED Medications  meclizine (ANTIVERT) tablet 25 mg (25 mg Oral Given 06/17/18 1005)  prochlorperazine (COMPAZINE) injection 10 mg (10 mg Intravenous Given 06/17/18 1005)  sodium chloride 0.9 % bolus 1,000 mL (0 mLs Intravenous Stopped 06/17/18 1128)  gi cocktail (Maalox,Lidocaine,Donnatal) (30 mLs Oral Given 06/17/18 1004)     Initial Impression / Assessment and Plan / ED Course  I have reviewed the triage vital signs and the nursing notes.  Pertinent labs & imaging results that were available during my care of the patient were reviewed by me and considered in my medical decision making (see chart for details).     47 yo M with a cc of chest pain and vertigo.  Going on for past 5 days.     The patient had an episode today where  he collapsed and struck his head.  Per the wife he was out for a few seconds.  The patient actually fell asleep a couple times while was talking to him.  I asked him why he was so sleepy and it turns out that he took multiple sleeping aids last night.  This may be the cause of his episode where he collapsed today.  Either way with him being sleepy I will obtain a CT of his head.  Will give Compazine and meclizine for his dizziness.  We will give a GI cocktail for his pain.  He does have some epigastric abdominal pain on my exam and so I think most likely this is gastric in nature.  He is also having left arm tingling.  I think this is most secondary to him taking his sleeping medicine and then causing him some neck pain which she has on exam.  I am unsure why the patient always seems to present with dizziness and chest pain together.  I have had multiple CT scans of the chest to try and evaluate for dissection.  The patient does have a thoracic aortic aneurysm that is just mildly over the normal range.  He has had 4 different CT scans of the chest evaluating this without any significant change over the past 6 years.  I do not feel strongly that this  is a dissection, or a ruptured aneurysm.  I will give the patient symptomatic therapy and reassess.  Patient feels much better after symptomatic therapy.  His delta troponin is negative.  Discharge home.  3:56 PM:  I have discussed the diagnosis/risks/treatment options with the patient and family and believe the pt to be eligible for discharge home to follow-up with PCP. We also discussed returning to the ED immediately if new or worsening sx occur. We discussed the sx which are most concerning (e.g., sudden worsening pain, fever, inability to tolerate by mouth) that necessitate immediate return. Medications administered to the patient during their visit and any new prescriptions provided to the patient are listed below.  Medications given during this  visit Medications  meclizine (ANTIVERT) tablet 25 mg (25 mg Oral Given 06/17/18 1005)  prochlorperazine (COMPAZINE) injection 10 mg (10 mg Intravenous Given 06/17/18 1005)  sodium chloride 0.9 % bolus 1,000 mL (0 mLs Intravenous Stopped 06/17/18 1128)  gi cocktail (Maalox,Lidocaine,Donnatal) (30 mLs Oral Given 06/17/18 1004)    The patient appears reasonably screen and/or stabilized for discharge and I doubt any other medical condition or other [facility] requiring further screening, evaluation, or treatment in the ED at this time prior to discharge.    Final Clinical Impressions(s) / ED Diagnoses   Final diagnoses:  Atypical chest pain  Vertigo  Light headed    ED Discharge Orders         Ordered    meclizine (ANTIVERT) 25 MG tablet  3 times daily PRN     06/17/18 [place]rant, Reia Viernes, DO 06/17/18 1556

## 2018-06-17 NOTE — ED Notes (Signed)
Pt requesting 2L [place] O2, O2 sats 99% on RA. Placed on [place] for comfort.

## 2018-06-17 NOTE — ED Notes (Signed)
Patient verbalizes understanding of discharge instructions. Opportunity for questioning and answers were provided. Armband removed by staff, pt discharged from ED.  

## 2018-06-17 NOTE — Discharge Instructions (Signed)
Try zantac or pepcid twice a day.  Try to avoid things that may make this worse, most commonly these are spicy foods tomato based products fatty foods chocolate and peppermint.  Alcohol and tobacco can also make this worse.  Return to the emergency department for sudden worsening pain fever or inability to eat or drink. ° °

## 2018-06-17 NOTE — ED Triage Notes (Signed)
Pt presents for evaluation of 4 days of chest pain and dizziness that worsened today. Pt with near syncope today r/t dizziness. Patient with cardiac hx. Given 2 nitro, 324 asa and 4 mg of morphine in route with limited improvement.

## 2018-06-19 ENCOUNTER — Telehealth: Payer: Self-pay

## 2018-06-19 NOTE — Telephone Encounter (Signed)
Called patient to schedule ED follow up visit. Left message for return call.  

## 2018-06-20 NOTE — Telephone Encounter (Signed)
Returned patient call asked to call back to schedule ED visit.

## 2018-06-20 NOTE — Telephone Encounter (Signed)
Pt called back. °

## 2018-06-22 ENCOUNTER — Ambulatory Visit: Payer: Self-pay | Admitting: Family

## 2018-06-23 ENCOUNTER — Other Ambulatory Visit: Payer: Self-pay | Admitting: Family

## 2018-06-23 DIAGNOSIS — M545 Low back pain, unspecified: Secondary | ICD-10-CM

## 2018-06-23 DIAGNOSIS — E119 Type 2 diabetes mellitus without complications: Secondary | ICD-10-CM

## 2018-06-23 DIAGNOSIS — J45909 Unspecified asthma, uncomplicated: Secondary | ICD-10-CM

## 2018-06-23 DIAGNOSIS — E785 Hyperlipidemia, unspecified: Secondary | ICD-10-CM

## 2018-06-25 ENCOUNTER — Other Ambulatory Visit: Payer: Self-pay | Admitting: Family

## 2018-06-25 NOTE — Telephone Encounter (Signed)
Copied from [place] [address] [id number]. Topic: Quick Communication - Rx Refill/Question >> Jun 25, 2018  5:08 PM Cecelia Byars, NT wrote: Medication:  lisinopril (PRINIVIL,ZESTRIL) 10 MG tablet  Has the patient contacted their pharmacy? yes (Agent: If no, request that the patient contact the pharmacy for the refill. (Agent: If yes, when and what did the pharmacy advise?  Preferred Pharmacy (with phone number or street name [place], [place] [phone] (Phone) [phone] (Fax  The patient wants his medication to go to the above pharmacy   Agent: Please be advised that RX refills may take up to 3 business days. We ask that you follow-up with your pharmacy.

## 2018-06-26 ENCOUNTER — Ambulatory Visit: Payer: Self-pay | Admitting: Family

## 2018-06-26 MED ORDER — LISINOPRIL 10 MG PO TABS: 10.0000 mg | ORAL_TABLET | Freq: Every day | ORAL | 0 refills | Status: DC

## 2018-06-26 NOTE — Telephone Encounter (Signed)
Requested Prescriptions  Pending Prescriptions Disp Refills  . lisinopril (PRINIVIL,ZESTRIL) 10 MG tablet 90 tablet 0    Sig: Take 1 tablet (10 mg total) by mouth daily.     Cardiovascular:  ACE Inhibitors Passed - 06/25/2018  6:01 PM      Passed - Cr in normal range and within 180 days    Creat  Date Value Ref Range Status  11/04/2016 1.06 0.60 - 1.35 mg/dL Final   Creatinine, Ser  Date Value Ref Range Status  06/17/2018 0.80 0.61 - 1.24 mg/dL Final         Passed - K in normal range and within 180 days    Potassium  Date Value Ref Range Status  06/17/2018 3.9 3.5 - 5.1 mmol/L Final         Passed - Patient is not pregnant      Passed - Last BP in normal range    BP Readings from Last 1 Encounters:  06/17/18 131/80         Passed - Valid encounter within last 6 months    Recent Outpatient Visits          2 months ago Depression with anxiety   [facility], NP   4 months ago Bronchitis with bronchospasm   [name], NP   5 months ago Moderate persistent asthma with acute exacerbation   [facility], NP   7 months ago Moderate asthma with exacerbation, unspecified whether persistent   [facility], MD   8 months ago Depression, unspecified depression type   [facility], NP      Future Appointments            Tomorrow Inda Castle, Lenna Sciara, NP [facility], [place]   In 1 week Juanito Doom, MD [place]   In 3 weeks [name], NP [employer] [facility], [facility]

## 2018-06-27 ENCOUNTER — Encounter: Payer: Self-pay | Admitting: Family

## 2018-06-27 ENCOUNTER — Ambulatory Visit (INDEPENDENT_AMBULATORY_CARE_PROVIDER_SITE_OTHER): Payer: Medicare Other | Admitting: Family

## 2018-06-27 ENCOUNTER — Ambulatory Visit (HOSPITAL_BASED_OUTPATIENT_CLINIC_OR_DEPARTMENT_OTHER)
Admission: RE | Admit: 2018-06-27 | Discharge: 2018-06-27 | Disposition: A | Discharge status: Home / Self Care | Payer: Medicare Other | Source: Ambulatory Visit | Attending: Family | Admitting: Family

## 2018-06-27 VITALS — BP 126/77 | HR 86 | Temp 98.3°F | Resp 16 | Ht 68.0 in | Wt 194.0 lb

## 2018-06-27 DIAGNOSIS — R55 Syncope and collapse: Secondary | ICD-10-CM | POA: Diagnosis not present

## 2018-06-27 DIAGNOSIS — M25512 Pain in left shoulder: Secondary | ICD-10-CM

## 2018-06-27 DIAGNOSIS — J45901 Unspecified asthma with (acute) exacerbation: Secondary | ICD-10-CM

## 2018-06-27 DIAGNOSIS — J0100 Acute maxillary sinusitis, unspecified: Secondary | ICD-10-CM

## 2018-06-27 DIAGNOSIS — K047 Periapical abscess without sinus: Secondary | ICD-10-CM | POA: Diagnosis not present

## 2018-06-27 DIAGNOSIS — R0781 Pleurodynia: Secondary | ICD-10-CM | POA: Insufficient documentation

## 2018-06-27 LAB — D-DIMER, QUANTITATIVE: D-Dimer, Quant: 0.43 mcg/mL FEU (ref <0.50)

## 2018-06-27 MED ORDER — AMOXICILLIN-POT CLAVULANATE 875-125 MG PO TABS: 1.0000 | ORAL_TABLET | Freq: Two times a day (BID) | ORAL | 0 refills | Status: DC

## 2018-06-27 MED ORDER — PREDNISONE 10 MG PO TABS: 10.0000 mg | ORAL_TABLET | Freq: Every day | ORAL | 0 refills | Status: DC

## 2018-06-27 NOTE — Progress Notes (Signed)
Subjective:    Patient ID: Cody Raymond, male    DOB: 03-21-1971, 47 y.o.   MRN: [id number]  HPI  Patient is a 47 year old male who presents today for ER follow-up.  ER record is reviewed from June 17, 2018.  He presented to the ER after a syncopal episode.  The syncopal episode was preceded by a 5-day history of chest pain.  He apparently had been feeling dizzy and got up to use the bathroom and fell and hit his head.  Wife noted loss of consciousness.  He had palpitations prior to this event.  He had 2- troponins in the emergency department, and unremarkable CBC, and a basic metabolic panel which is notable for mild hypocalcemia with a calcium level of 8.8.  CT of the head was performed which was negative for acute infarct mass or hemorrhage.  Chest x-ray was clear.  Reports that he has had left shoulder pain since he fell.  Has decreased range of motion.  Has been using tramadol as needed for pain.  Left sided pleuritic chest pain.  Notes difficulty concentrating when he is reading.  Left shoulder pain. Reports that dizziness started 2 weeks ago.  Notes no significant improvement with meclizine.  He continues to have chest pain which is not relieved by nitro.  Reports + fatigue.  Has has + sinus pressure across the frontal sinus for a few days which was preceded by wheezing/tightness with breathing.    Review of Systems    see HPI  Past Medical History:  Diagnosis Date  . Aneurysm ([place])   . Asthma   . Blood transfusion   . CAD (coronary artery disease), non obstructive on cath 2011 04/05/2012  . Coronary artery disease   . Diabetes mellitus   . Enlarged prostate   . GERD (gastroesophageal reflux disease)   . H/O syncope   . Heart disease   . History of repair of patent ductus arteriosus 07/23/2016  . Hyperlipidemia   . Hypertension   . 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 Refusal of blood transfusions as patient is Jehovah's Witness      Social History    Socioeconomic History  . Marital status: Married    Spouse name: Not on file  . Number of children: Not on file  . Years of education: Not on file  . Highest education level: Not on file  Occupational History  . Not on file  Social Needs  . Financial resource strain: Not on file  . Food insecurity:    Worry: Not on file    Inability: Not on file  . Transportation needs:    Medical: Not on file    Non-medical: Not on file  Tobacco Use  . Smoking status: Never Smoker  . Smokeless tobacco: Never Used  Substance and Sexual Activity  . Alcohol use: No  . Drug use: No  . Sexual activity: Not on file  Lifestyle  . Physical activity:    Days per week: Not on file    Minutes per session: Not on file  . Stress: Not on file  Relationships  . Social connections:    Talks on phone: Not on file    Gets together: Not on file    Attends religious service: Not on file    Active member of club or organization: Not on file    Attends meetings of clubs or organizations: Not on file    Relationship status: Not on file  .  Intimate partner violence:    Fear of current or ex partner: Not on file    Emotionally abused: Not on file    Physically abused: Not on file    Forced sexual activity: Not on file  Other Topics Concern  . Not on file  Social History Narrative   Holter monitor 08/2010: PVCs and sinus tachy.   Sleep Study (02/2008): mild sleep apnea, no indication for CPAP.    Past Surgical History:  Procedure Laterality Date  . APPENDECTOMY    . BACK SURGERY    . CARDIAC CATHETERIZATION  2007   Clean Cardiac Cath ([place]), with RCA 30% narrowing likely due to catheter induced spasm in 09/02/10.  Marland Kitchen CARDIAC CATHETERIZATION  09/02/2010   mod. nonobstructive disease in the RCA and CX, tortuous LAD  . COLONOSCOPY W/ POLYPECTOMY  03/17/11   diminutive polyp  . LOOP RECORDER EXPLANT N/A 01/14/2014   Procedure: LOOP RECORDER EXPLANT;  Surgeon: Sanda Klein, MD;  Location:  [facility];  Service: Cardiovascular;  Laterality: N/A;  . LOOP RECORDER IMPLANT  04/06/2012   Reveal XT 7106  . LOOP RECORDER IMPLANT N/A 04/06/2012   Procedure: LOOP RECORDER IMPLANT;  Surgeon: Sanda Klein, MD;  Location: [facility];  Service: Cardiovascular;  Laterality: N/A;  . NM MYOCAR PERF WALL MOTION  01/25/2008   mild anteroapical wall ischemia  . PATENT DUCTUS ARTERIOUS REPAIR     at age 12    Family History  Problem Relation Age of Onset  . Colon cancer Paternal Grandfather   . Prostate cancer Paternal Grandfather   . Aneurysm Father   . Heart attack Father 71  . Coronary artery disease Father   . Colon polyps Mother   . Heart disease Mother   . Coronary artery disease Mother   . Migraines Mother   . Breast cancer Maternal Grandmother   . Colon cancer Paternal Uncle        x 2  . Stomach cancer Brother   . Liver disease Unknown        unsure who it was  . Allergies Daughter     Allergies  Allergen Reactions  . Benadryl [Diphenhydramine Hcl]     "drives me nuts"    Current Outpatient Medications on File Prior to Visit  Medication Sig Dispense Refill  . albuterol (PROVENTIL) (2.5 MG/3ML) 0.083% nebulizer solution USE 1 VIAL VIA NEBULIZER  EVERY 6 HOURS AS NEEDED FOR WHEEZING OR SHORTNESS OF  BREATH (Patient taking differently: Take 2.5 mg by nebulization every 6 (six) hours as needed for wheezing or shortness of breath. ) 1125 mL 1  . aspirin 81 MG tablet Take 81 mg by mouth daily.     Marland Kitchen atorvastatin (LIPITOR) 80 MG tablet TAKE 1 TABLET BY MOUTH  DAILY 90 tablet 1  . cetirizine (ZYRTEC) 10 MG tablet Take 10 mg by mouth daily.    . clonazePAM (KLONOPIN) 1 MG tablet TAKE 1 TABLET BY MOUTH  DAILY 90 tablet 0  . diclofenac sodium (VOLTAREN) 1 % GEL Apply 2 g topically 4 (four) times daily as needed (pain). 720 g 0  . ezetimibe (ZETIA) 10 MG tablet TAKE 1 TABLET BY MOUTH  DAILY 90 tablet 1  . fenofibrate (TRICOR) 145 MG tablet Take 1 tablet by mouth  daily 90 tablet  1  . fluticasone (FLONASE) 50 MCG/ACT nasal spray Place 2 sprays into both nostrils as needed for allergies or rhinitis.    . Fluticasone-Salmeterol (AIRDUO RESPICLICK 269/48) 546-27  MCG/ACT AEPB Inhale 1 puff into the lungs 2 (two) times daily. 1 each 3  . gabapentin (NEURONTIN) 300 MG capsule TAKE 1 CAPSULE BY MOUTH AT  6AM, 11:30AM AND 4:30PM AND TAKE 3 CAPSULES AT 10:00PM 540 capsule 1  . ibuprofen (IBU) 800 MG tablet Take 800 mg by mouth every 6 (six) hours as needed for pain.    . isosorbide mononitrate (IMDUR) 30 MG 24 hr tablet Take 1 tablet (30 mg total) by mouth daily. 30 tablet 0  . ketoconazole (NIZORAL) 2 % shampoo APPLY TOPICALLY DAILY 360 mL 1  . lisinopril (PRINIVIL,ZESTRIL) 10 MG tablet Take 1 tablet (10 mg total) by mouth daily. 90 tablet 0  . loperamide (IMODIUM) 2 MG capsule Take 1 capsule (2 mg total) by mouth as needed for diarrhea or loose stools. 12 capsule 0  . meclizine (ANTIVERT) 25 MG tablet Take 1 tablet (25 mg total) by mouth 3 (three) times daily as needed for dizziness. 30 tablet 0  . meloxicam (MOBIC) 7.5 MG tablet TAKE 1 TABLET BY MOUTH  DAILY 90 tablet 0  . metFORMIN (GLUCOPHAGE-XR) 500 MG 24 hr tablet TAKE 2 TABLETS BY MOUTH  DAILY WITH BREAKFAST 180 tablet 1  . metoprolol (LOPRESSOR) 50 MG tablet Take 1 tablet (50 mg total) by mouth once as directed. 1 tablet 0  . montelukast (SINGULAIR) 10 MG tablet TAKE 1 TABLET BY MOUTH AT  BEDTIME 90 tablet 1  . Multiple Vitamin (MULTIVITAMIN WITH MINERALS) TABS tablet Take 1 tablet by mouth daily.    . nitroGLYCERIN (NITROSTAT) 0.4 MG SL tablet Place 1 tablet (0.4 mg total) under the tongue every 5 (five) minutes as needed for chest pain. 30 tablet 1  . ONETOUCH VERIO test strip CHECK BLOOD SUGAR 3 TIMES  DAILY 300 each 3  . PROAIR HFA 108 (90 Base) MCG/ACT inhaler Inhale 2 puffs into the lungs every 6 (six) hours as needed for wheezing or shortness of breath. 3 Inhaler 1  . sertraline (ZOLOFT) 100 MG tablet TAKE 1 TABLET  BY MOUTH  DAILY (Patient taking differently: Take 100 mg by mouth daily. ) 90 tablet 1  . tamsulosin (FLOMAX) 0.4 MG CAPS capsule TAKE 1 CAPSULE BY MOUTH  DAILY (Patient taking differently: Take 0.4 mg by mouth daily. ) 90 capsule 1  . tiZANidine (ZANAFLEX) 2 MG tablet TAKE 1 TABLET BY MOUTH  EVERY 6 HOURS AS NEEDED FOR MUSCLE SPASM(S) (Patient taking differently: Take 2 mg by mouth every 6 (six) hours as needed for muscle spasms. ) 270 tablet 2  . traMADol (ULTRAM) 50 MG tablet TAKE 1 TABLET BY MOUTH  EVERY 6 HOURS AS NEEDED FOR SEVERE PAIN (Patient taking differently: Take 50 mg by mouth every 6 (six) hours as needed for severe pain. TAKE 1 TABLET BY MOUTH  EVERY 6 HOURS AS NEEDED FOR SEVERE PAIN) 120 tablet 4  . traZODone (DESYREL) 100 MG tablet TAKE 1 TABLET BY MOUTH AT  BEDTIME (Patient taking differently: Take 100 mg by mouth at bedtime. ) 90 tablet 1  . traZODone (DESYREL) 100 MG tablet TAKE 1 TABLET BY MOUTH AT  BEDTIME 90 tablet 0  . [DISCONTINUED] niacin (NIASPAN) 1000 MG CR tablet Take 1 tablet (1,000 mg total) by mouth at bedtime. 30 tablet 6   No current facility-administered medications on file prior to visit.     BP 126/77 (BP Location: Right Arm, Patient Position: Sitting, Cuff Size: Small)   Pulse 86   Temp 98.3 F (36.8 C) (Oral)  Resp 16   Ht 5\' 8"  (1.727 m)   Wt 194 lb (88 kg)   SpO2 100%   BMI 29.50 kg/m    Objective:   Physical Exam  Constitutional: He is oriented to person, place, and time. He appears well-developed and well-nourished. No distress.  HENT:  Head: Normocephalic and atraumatic.  Poor dentition is noted.  There is a molar on the left lower mouth which is cracked with decay and some erythema of surrounding gums  Cardiovascular: Normal rate and regular rhythm.  No murmur heard. Pulmonary/Chest: Effort normal. No respiratory distress. He has wheezes. He has no rales.  Musculoskeletal: He exhibits no edema.  Decreased range of motion noted left  shoulder.  Positive tenderness to palpation.  No swelling.  Neurological: He is alert and oriented to person, place, and time.  Skin: Skin is warm and dry.  Psychiatric: He has a normal mood and affect. His behavior is normal. Thought content normal.          Assessment & Plan:  Syncope-etiology is unclear.  Cardiac is certainly a possibility.  He has an appointment tomorrow with his cardiologist.  He did have a CTA of the chest a month ago however the symptoms of dizziness have only been present for approximately 2 weeks.  I am concerned about the possibility of PE due to his pleuritic chest pain and recent syncope.  I will obtain a stat d-dimer and if positive he will need to undergo a CT angios.  Left shoulder pain-we will obtain x-ray of the left shoulder.  If negative plan referral to sports medicine for further soft tissue evaluation.  Acute asthma exacerbation-positive wheezing today.  Will obtain chest x-ray, begin prednisone taper.  Augmentin for empiric bronchitis coverage.  Dental abscess-will Rx with Augmentin.  sinusitis- will Rx with Augmentin.  Of note a retention cyst in the left maxillary antrum was noted on CT head.

## 2018-06-27 NOTE — Patient Instructions (Signed)
Please complete lab work prior to leaving. Complete x-rays on the first floor. Begin prednisone today.  Begin augmentin for sinus/tooth abscess/possible bronchitis. Keep appointment with cardiology tomorrw.

## 2018-06-28 ENCOUNTER — Encounter: Payer: Self-pay | Admitting: Family

## 2018-06-28 DIAGNOSIS — M25512 Pain in left shoulder: Secondary | ICD-10-CM

## 2018-06-29 ENCOUNTER — Ambulatory Visit (INDEPENDENT_AMBULATORY_CARE_PROVIDER_SITE_OTHER): Payer: Medicare Other | Admitting: Family Medicine

## 2018-06-29 ENCOUNTER — Encounter: Payer: Self-pay | Admitting: Family Medicine

## 2018-06-29 VITALS — BP 118/76 | HR 94 | Ht 68.0 in | Wt 190.0 lb

## 2018-06-29 DIAGNOSIS — S29011A Strain of muscle and tendon of front wall of thorax, initial encounter: Secondary | ICD-10-CM

## 2018-06-29 NOTE — Patient Instructions (Signed)
You have a left pectoralis strain. Your ultrasound and x-rays are reassuring. Icing 15 minutes at a time 3-4 times a day. Aleve 2 tabs twice a day with food for pain and inflammation. Tramadol as needed. Sling as needed. Arm circles, swings, table slides 3 sets of 10 twice a day. Follow up with me in 2 weeks. We will consider repeating the ultrasound, physical therapy if not improving as expected.

## 2018-07-01 ENCOUNTER — Other Ambulatory Visit: Payer: Self-pay | Admitting: Family

## 2018-07-02 ENCOUNTER — Encounter: Payer: Self-pay | Admitting: Family Medicine

## 2018-07-02 NOTE — Progress Notes (Signed)
PCP and consultation requested by: [name], NP  Subjective:   HPI: Patient is a 47 y.o. male here for left shoulder injury.  Patient reports on 9/29 he had chest pain with radiation into his left arm. Progressed and had a syncopal episode and fell, unsure how he injured left arm. He went to the ED and had workup for pain and syncope. Noticed pain that's been progressive anterior left chest into shoulder. Pain level 8/10 and sharp. Worse with trying to move right arm. Motion is limited and shoulder feels bruised. No skin changes or numbness.  Past Medical History:  Diagnosis Date  . Aneurysm ([place])   . Asthma   . Blood transfusion   . CAD (coronary artery disease), non obstructive on cath 2011 04/05/2012  . Coronary artery disease   . Diabetes mellitus   . Enlarged prostate   . GERD (gastroesophageal reflux disease)   . H/O syncope   . Heart disease   . History of repair of patent ductus arteriosus 07/23/2016  . Hyperlipidemia   . Hypertension   . 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 Refusal of blood transfusions as patient is Jehovah's Witness     Current Outpatient Medications on File Prior to Visit  Medication Sig Dispense Refill  . albuterol (PROVENTIL) (2.5 MG/3ML) 0.083% nebulizer solution USE 1 VIAL VIA NEBULIZER  EVERY 6 HOURS AS NEEDED FOR WHEEZING OR SHORTNESS OF  BREATH (Patient taking differently: Take 2.5 mg by nebulization every 6 (six) hours as needed for wheezing or shortness of breath. ) 1125 mL 1  . amoxicillin-clavulanate (AUGMENTIN) 875-125 MG tablet Take 1 tablet by mouth 2 (two) times daily. 20 tablet 0  . aspirin 81 MG tablet Take 81 mg by mouth daily.     Marland Kitchen atorvastatin (LIPITOR) 80 MG tablet TAKE 1 TABLET BY MOUTH  DAILY 90 tablet 1  . cetirizine (ZYRTEC) 10 MG tablet Take 10 mg by mouth daily.    . clonazePAM (KLONOPIN) 1 MG tablet TAKE 1 TABLET BY MOUTH  DAILY 90 tablet 0  . diclofenac sodium (VOLTAREN) 1 % GEL Apply 2 g topically 4  (four) times daily as needed (pain). 720 g 0  . fenofibrate (TRICOR) 145 MG tablet Take 1 tablet by mouth  daily 90 tablet 1  . fluticasone (FLONASE) 50 MCG/ACT nasal spray Place 2 sprays into both nostrils as needed for allergies or rhinitis.    . Fluticasone-Salmeterol (AIRDUO RESPICLICK 580/99) 833-82 MCG/ACT AEPB Inhale 1 puff into the lungs 2 (two) times daily. 1 each 3  . gabapentin (NEURONTIN) 300 MG capsule TAKE 1 CAPSULE BY MOUTH AT  6AM, 11:30AM AND 4:30PM AND TAKE 3 CAPSULES AT 10:00PM 540 capsule 1  . ibuprofen (IBU) 800 MG tablet Take 800 mg by mouth every 6 (six) hours as needed for pain.    . isosorbide mononitrate (IMDUR) 30 MG 24 hr tablet Take 1 tablet (30 mg total) by mouth daily. 30 tablet 0  . ketoconazole (NIZORAL) 2 % shampoo APPLY TOPICALLY DAILY 360 mL 1  . loperamide (IMODIUM) 2 MG capsule Take 1 capsule (2 mg total) by mouth as needed for diarrhea or loose stools. 12 capsule 0  . meclizine (ANTIVERT) 25 MG tablet Take 1 tablet (25 mg total) by mouth 3 (three) times daily as needed for dizziness. 30 tablet 0  . meloxicam (MOBIC) 7.5 MG tablet TAKE 1 TABLET BY MOUTH  DAILY 90 tablet 0  . metFORMIN (GLUCOPHAGE-XR) 500 MG 24 hr tablet TAKE  2 TABLETS BY MOUTH  DAILY WITH BREAKFAST 180 tablet 1  . metoprolol (LOPRESSOR) 50 MG tablet Take 1 tablet (50 mg total) by mouth once as directed. 1 tablet 0  . montelukast (SINGULAIR) 10 MG tablet TAKE 1 TABLET BY MOUTH AT  BEDTIME 90 tablet 1  . Multiple Vitamin (MULTIVITAMIN WITH MINERALS) TABS tablet Take 1 tablet by mouth daily.    . nitroGLYCERIN (NITROSTAT) 0.4 MG SL tablet Place 1 tablet (0.4 mg total) under the tongue every 5 (five) minutes as needed for chest pain. 30 tablet 1  . ONETOUCH VERIO test strip CHECK BLOOD SUGAR 3 TIMES  DAILY 300 each 3  . predniSONE (DELTASONE) 10 MG tablet Take 1 tablet (10 mg total) by mouth daily with breakfast. 20 tablet 0  . PROAIR HFA 108 (90 Base) MCG/ACT inhaler Inhale 2 puffs into the lungs  every 6 (six) hours as needed for wheezing or shortness of breath. 3 Inhaler 1  . sertraline (ZOLOFT) 100 MG tablet TAKE 1 TABLET BY MOUTH  DAILY (Patient taking differently: Take 100 mg by mouth daily. ) 90 tablet 1  . tamsulosin (FLOMAX) 0.4 MG CAPS capsule TAKE 1 CAPSULE BY MOUTH  DAILY (Patient taking differently: Take 0.4 mg by mouth daily. ) 90 capsule 1  . tiZANidine (ZANAFLEX) 2 MG tablet TAKE 1 TABLET BY MOUTH  EVERY 6 HOURS AS NEEDED FOR MUSCLE SPASM(S) (Patient taking differently: Take 2 mg by mouth every 6 (six) hours as needed for muscle spasms. ) 270 tablet 2  . traMADol (ULTRAM) 50 MG tablet TAKE 1 TABLET BY MOUTH  EVERY 6 HOURS AS NEEDED FOR SEVERE PAIN (Patient taking differently: Take 50 mg by mouth every 6 (six) hours as needed for severe pain. TAKE 1 TABLET BY MOUTH  EVERY 6 HOURS AS NEEDED FOR SEVERE PAIN) 120 tablet 4  . traZODone (DESYREL) 100 MG tablet TAKE 1 TABLET BY MOUTH AT  BEDTIME (Patient taking differently: Take 100 mg by mouth at bedtime. ) 90 tablet 1  . traZODone (DESYREL) 100 MG tablet TAKE 1 TABLET BY MOUTH AT  BEDTIME 90 tablet 0  . [DISCONTINUED] niacin (NIASPAN) 1000 MG CR tablet Take 1 tablet (1,000 mg total) by mouth at bedtime. 30 tablet 6   No current facility-administered medications on file prior to visit.     Past Surgical History:  Procedure Laterality Date  . APPENDECTOMY    . BACK SURGERY    . CARDIAC CATHETERIZATION  2007   Clean Cardiac Cath ([place]), with RCA 30% narrowing likely due to catheter induced spasm in 09/02/10.  Marland Kitchen CARDIAC CATHETERIZATION  09/02/2010   mod. nonobstructive disease in the RCA and CX, tortuous LAD  . COLONOSCOPY W/ POLYPECTOMY  03/17/11   diminutive polyp  . LOOP RECORDER EXPLANT N/A 01/14/2014   Procedure: LOOP RECORDER EXPLANT;  Surgeon: Sanda Klein, MD;  Location: [facility];  Service: Cardiovascular;  Laterality: N/A;  . LOOP RECORDER IMPLANT  04/06/2012   Reveal XT 1751  . LOOP RECORDER IMPLANT  N/A 04/06/2012   Procedure: LOOP RECORDER IMPLANT;  Surgeon: Sanda Klein, MD;  Location: [facility];  Service: Cardiovascular;  Laterality: N/A;  . NM MYOCAR PERF WALL MOTION  01/25/2008   mild anteroapical wall ischemia  . PATENT DUCTUS ARTERIOUS REPAIR     at age 89    Allergies  Allergen Reactions  . Benadryl [Diphenhydramine Hcl]     "drives me nuts"    Social History   Socioeconomic History  .  Marital status: Married    Spouse name: Not on file  . Number of children: Not on file  . Years of education: Not on file  . Highest education level: Not on file  Occupational History  . Not on file  Social Needs  . Financial resource strain: Not on file  . Food insecurity:    Worry: Not on file    Inability: Not on file  . Transportation needs:    Medical: Not on file    Non-medical: Not on file  Tobacco Use  . Smoking status: Never Smoker  . Smokeless tobacco: Never Used  Substance and Sexual Activity  . Alcohol use: No  . Drug use: No  . Sexual activity: Not on file  Lifestyle  . Physical activity:    Days per week: Not on file    Minutes per session: Not on file  . Stress: Not on file  Relationships  . Social connections:    Talks on phone: Not on file    Gets together: Not on file    Attends religious service: Not on file    Active member of club or organization: Not on file    Attends meetings of clubs or organizations: Not on file    Relationship status: Not on file  . Intimate partner violence:    Fear of current or ex partner: Not on file    Emotionally abused: Not on file    Physically abused: Not on file    Forced sexual activity: Not on file  Other Topics Concern  . Not on file  Social History Narrative   Holter monitor 08/2010: PVCs and sinus tachy.   Sleep Study (02/2008): mild sleep apnea, no indication for CPAP.    Family History  Problem Relation Age of Onset  . Colon cancer Paternal Grandfather   . Prostate cancer Paternal Grandfather   .  Aneurysm Father   . Heart attack Father 14  . Coronary artery disease Father   . Colon polyps Mother   . Heart disease Mother   . Coronary artery disease Mother   . Migraines Mother   . Breast cancer Maternal Grandmother   . Colon cancer Paternal Uncle        x 2  . Stomach cancer Brother   . Liver disease Unknown        unsure who it was  . Allergies Daughter     BP 118/76   Pulse 94   Ht 5\' 8"  (1.727 m)   Wt 190 lb (86.2 kg)   BMI 28.89 kg/m   Review of Systems: See HPI above.     Objective:  Physical Exam:  Gen: NAD, comfortable in exam room  Left shoulder: No swelling, ecchymoses.  No gross deformity.  Anterior axillary folds symmetric TTP mid-left pectoralis.  No shoulder, other tenderness. Full IR.  Abduction and flexion to 90 degrees, ER to 45 with pain anterior left chest wall. Negative Hawkins, Neers. Negative Yergasons. Strength 5/5 with empty can and resisted internal/external rotation.  Pain simulated bench press NV intact distally.  Right shoulder: No swelling, ecchymoses.  No gross deformity. No TTP. FROM. Strength 5/5 with empty can and resisted internal/external rotation. NV intact distally.   MSK u/s left pectoralis:  Distal tendon intact out to insertion on humerus.  No obvious tears within musculature.  Subscapularis, infraspinatus, and supraspinatus intact.  Assessment & Plan:  1. Left shoulder injury - consistent with strain of left pectoralis without tendon tear.  Ultrasound and x-rays reassuring.  Icing, aleve, tramadol.  Sling as needed.  Start motion exercises. F/u in 2 weeks.  Consider PT at that time.

## 2018-07-02 NOTE — Telephone Encounter (Signed)
Last office visit on 06/27/2018 Last refill 04/19/2018  #90 no refills Last [facility] 09/29/2017

## 2018-07-05 ENCOUNTER — Ambulatory Visit: Payer: Self-pay | Admitting: Pulmonary Disease

## 2018-07-09 ENCOUNTER — Other Ambulatory Visit: Payer: Self-pay | Admitting: Family

## 2018-07-10 ENCOUNTER — Telehealth: Payer: Self-pay | Admitting: *Deleted

## 2018-07-10 NOTE — Telephone Encounter (Signed)
What medication are they requesting?

## 2018-07-10 NOTE — Telephone Encounter (Signed)
My records show lisinopril. Please decline losartan and notify patient.

## 2018-07-10 NOTE — Telephone Encounter (Signed)
Received refill request from [facility] for losartan. I do not see medication on current med list.  Please advise request?

## 2018-07-10 NOTE — Telephone Encounter (Signed)
Losartan 

## 2018-07-10 NOTE — Telephone Encounter (Signed)
Form with written denial faxed to [facility] at [phone].

## 2018-07-12 ENCOUNTER — Telehealth: Payer: Self-pay

## 2018-07-12 NOTE — Telephone Encounter (Signed)
Author received fax from [facility] requesting losartan tabs. Medication not on active med list; no dosage specified. Routed to PCP to advise.

## 2018-07-13 ENCOUNTER — Ambulatory Visit: Payer: Self-pay | Admitting: Family Medicine

## 2018-07-13 NOTE — Telephone Encounter (Signed)
Denial for losartan was sent to [facility] 10/22.  Please see earlier phone note.

## 2018-07-18 ENCOUNTER — Other Ambulatory Visit: Payer: Self-pay | Admitting: Family

## 2018-07-18 ENCOUNTER — Other Ambulatory Visit: Payer: Self-pay | Admitting: Physical Medicine & Rehabilitation

## 2018-07-18 DIAGNOSIS — M545 Low back pain, unspecified: Secondary | ICD-10-CM

## 2018-07-23 ENCOUNTER — Ambulatory Visit: Payer: Self-pay | Admitting: Family

## 2018-07-23 NOTE — Telephone Encounter (Signed)
Received another Losartan request from [facility]. Attempted to reach pt and left detailed message that our records indicate he is no longer on losartan and should be taking lisinopril and to call back if he is still taking losartan. Called [facility] and spoke with representative and advised her to please cancel losartan RX and stop sending refill request when we have already denied medication previously. She states RX has been cancelled.

## 2018-07-25 ENCOUNTER — Ambulatory Visit: Payer: Self-pay | Admitting: Family

## 2018-07-31 ENCOUNTER — Encounter: Payer: Self-pay | Admitting: Family

## 2018-07-31 ENCOUNTER — Ambulatory Visit (INDEPENDENT_AMBULATORY_CARE_PROVIDER_SITE_OTHER): Payer: Medicare Other | Admitting: Family

## 2018-07-31 ENCOUNTER — Ambulatory Visit (HOSPITAL_BASED_OUTPATIENT_CLINIC_OR_DEPARTMENT_OTHER)
Admission: RE | Admit: 2018-07-31 | Discharge: 2018-07-31 | Disposition: A | Discharge status: Home / Self Care | Payer: Medicare Other | Source: Ambulatory Visit | Attending: Family | Admitting: Family

## 2018-07-31 VITALS — BP 114/71 | HR 94 | Temp 98.4°F | Resp 16 | Ht 68.0 in | Wt 194.2 lb

## 2018-07-31 DIAGNOSIS — E1142 Type 2 diabetes mellitus with diabetic polyneuropathy: Secondary | ICD-10-CM

## 2018-07-31 DIAGNOSIS — Z23 Encounter for immunization: Secondary | ICD-10-CM | POA: Diagnosis not present

## 2018-07-31 DIAGNOSIS — M797 Fibromyalgia: Secondary | ICD-10-CM

## 2018-07-31 DIAGNOSIS — F329 Major depressive disorder, single episode, unspecified: Secondary | ICD-10-CM

## 2018-07-31 DIAGNOSIS — J45909 Unspecified asthma, uncomplicated: Secondary | ICD-10-CM

## 2018-07-31 DIAGNOSIS — E785 Hyperlipidemia, unspecified: Secondary | ICD-10-CM | POA: Diagnosis not present

## 2018-07-31 DIAGNOSIS — R062 Wheezing: Secondary | ICD-10-CM

## 2018-07-31 DIAGNOSIS — F32A Depression, unspecified: Secondary | ICD-10-CM

## 2018-07-31 LAB — COMPREHENSIVE METABOLIC PANEL
ALT: 14 U/L (ref 0–53)
AST: 13 U/L (ref 0–37)
Albumin: 4.6 g/dL (ref 3.5–5.2)
Alkaline Phosphatase: 81 U/L (ref 39–117)
BILIRUBIN TOTAL: 0.6 mg/dL (ref 0.2–1.2)
BUN: 22 mg/dL (ref 6–23)
CO2: 30 meq/L (ref 19–32)
Calcium: 9.6 mg/dL (ref 8.4–10.5)
Chloride: 99 mEq/L (ref 96–112)
Creatinine, Ser: 0.87 mg/dL (ref 0.40–1.50)
GFR: 99.66 mL/min (ref >60.00)
GLUCOSE: 138 mg/dL — AB (ref 70–99)
Potassium: 4.2 mEq/L (ref 3.5–5.1)
Sodium: 137 mEq/L (ref 135–145)
Total Protein: 7.3 g/dL (ref 6.0–8.3)

## 2018-07-31 LAB — HEMOGLOBIN A1C: HEMOGLOBIN A1C: 5.9 % (ref 4.6–6.5)

## 2018-07-31 LAB — LIPID PANEL
CHOL/HDL RATIO: 4
Cholesterol: 195 mg/dL (ref 0–200)
HDL: 43.7 mg/dL (ref >39.00)
NONHDL: 150.85
Triglycerides: 347 mg/dL — ABNORMAL HIGH (ref 0.0–149.0)
VLDL: 69.4 mg/dL — ABNORMAL HIGH (ref 0.0–40.0)

## 2018-07-31 LAB — LDL CHOLESTEROL, DIRECT: Direct LDL: 119 mg/dL

## 2018-07-31 MED ORDER — SERTRALINE HCL 100 MG PO TABS: 50.0000 mg | ORAL_TABLET | Freq: Every day | ORAL | 1 refills | Status: DC

## 2018-07-31 MED ORDER — BUDESONIDE-FORMOTEROL FUMARATE 80-4.5 MCG/ACT IN AERO: 2.0000 | INHALATION_SPRAY | Freq: Two times a day (BID) | RESPIRATORY_TRACT | 12 refills | Status: DC

## 2018-07-31 MED ORDER — DULOXETINE HCL 30 MG PO CPEP: 30.0000 mg | ORAL_CAPSULE | Freq: Every day | ORAL | 0 refills | Status: DC

## 2018-07-31 NOTE — Progress Notes (Signed)
Subjective:    Patient ID: Cody Raymond, male    DOB: [birth year], 46 y.o.   MRN: [id number]  HPI  Patient is a 47 yr old male who presents today for follow up.  DM2-he is maintained on metformin Lab Results  Component Value Date   HGBA1C 5.6 04/20/2018   HGBA1C 5.6 01/10/2018   HGBA1C 5.7 07/25/2017   Lab Results  Component Value Date   MICROALBUR 1.15 11/20/2013   LDLCALC 122 (H) 04/17/2015   CREATININE 0.80 06/17/2018   Depression/anxiety- continues zoloft. Mood is up and down.  Using klonazepam qhs.  Sleeps "so so."  Gets up about 3 times a night to urinate. Falls back to sleep in 30-60 minutes. Has been thinking a lot. Stressed about family issues with mother, wife.  Insomnia-reports that he uses clonazepam at bedtime which is helpful for sleep.  Cough- had an e-visit 10 days ago. Was given prednisone and abx.  Reports his sinuses are improved. Reports that he continues to have some pain in his lungs. Denies fever.   Hyperlipidemia- on statin, fenofibrate. Has chronic fibromyalgia pain.  Review of Systems See hpi  Past Medical History:  Diagnosis Date  . Aneurysm ([place])   . Asthma   . Blood transfusion   . CAD (coronary artery disease), non obstructive on cath 2011 04/05/2012  . Coronary artery disease   . Diabetes mellitus   . Enlarged prostate   . GERD (gastroesophageal reflux disease)   . H/O syncope   . Heart disease   . History of repair of patent ductus arteriosus 07/23/2016  . Hyperlipidemia   . Hypertension   . 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 Refusal of blood transfusions as patient is Jehovah's Witness      Social History   Socioeconomic History  . Marital status: Married    Spouse name: Not on file  . Number of children: Not on file  . Years of education: Not on file  . Highest education level: Not on file  Occupational History  . Not on file  Social Needs  . Financial resource strain: Not on file  . Food insecurity:    Worry:  Not on file    Inability: Not on file  . Transportation needs:    Medical: Not on file    Non-medical: Not on file  Tobacco Use  . Smoking status: Never Smoker  . Smokeless tobacco: Never Used  Substance and Sexual Activity  . Alcohol use: No  . Drug use: No  . Sexual activity: Not on file  Lifestyle  . Physical activity:    Days per week: Not on file    Minutes per session: Not on file  . Stress: Not on file  Relationships  . Social connections:    Talks on phone: Not on file    Gets together: Not on file    Attends religious service: Not on file    Active member of club or organization: Not on file    Attends meetings of clubs or organizations: Not on file    Relationship status: Not on file  . Intimate partner violence:    Fear of current or ex partner: Not on file    Emotionally abused: Not on file    Physically abused: Not on file    Forced sexual activity: Not on file  Other Topics Concern  . Not on file  Social History Narrative   Holter monitor 08/2010: PVCs and sinus tachy.  Sleep Study (02/2008): mild sleep apnea, no indication for CPAP.    Past Surgical History:  Procedure Laterality Date  . APPENDECTOMY    . BACK SURGERY    . CARDIAC CATHETERIZATION  2007   Clean Cardiac Cath ([place]), with RCA 30% narrowing likely due to catheter induced spasm in 09/02/10.  Marland Kitchen CARDIAC CATHETERIZATION  09/02/2010   mod. nonobstructive disease in the RCA and CX, tortuous LAD  . COLONOSCOPY W/ POLYPECTOMY  03/17/11   diminutive polyp  . LOOP RECORDER EXPLANT N/A 01/14/2014   Procedure: LOOP RECORDER EXPLANT;  Surgeon: Sanda Klein, MD;  Location: [facility];  Service: Cardiovascular;  Laterality: N/A;  . LOOP RECORDER IMPLANT  04/06/2012   Reveal XT [id number]  . LOOP RECORDER IMPLANT N/A 04/06/2012   Procedure: LOOP RECORDER IMPLANT;  Surgeon: Sanda Klein, MD;  Location: [facility];  Service: Cardiovascular;  Laterality: N/A;  . NM MYOCAR PERF WALL MOTION   01/25/2008   mild anteroapical wall ischemia  . PATENT DUCTUS ARTERIOUS REPAIR     at age 51    Family History  Problem Relation Age of Onset  . Colon cancer Paternal Grandfather   . Prostate cancer Paternal Grandfather   . Aneurysm Father   . Heart attack Father 65  . Coronary artery disease Father   . Colon polyps Mother   . Heart disease Mother   . Coronary artery disease Mother   . Migraines Mother   . Breast cancer Maternal Grandmother   . Colon cancer Paternal Uncle        x 2  . Stomach cancer Brother   . Liver disease Unknown        unsure who it was  . Allergies Daughter     Allergies  Allergen Reactions  . Benadryl [Diphenhydramine Hcl]     "drives me nuts"    Current Outpatient Medications on File Prior to Visit  Medication Sig Dispense Refill  . albuterol (PROVENTIL) (2.5 MG/3ML) 0.083% nebulizer solution USE 1 VIAL VIA NEBULIZER  EVERY 6 HOURS AS NEEDED FOR WHEEZING OR SHORTNESS OF  BREATH (Patient taking differently: Take 2.5 mg by nebulization every 6 (six) hours as needed for wheezing or shortness of breath. ) 1125 mL 1  . amoxicillin-clavulanate (AUGMENTIN) 875-125 MG tablet Take 1 tablet by mouth 2 (two) times daily. 20 tablet 0  . aspirin 81 MG tablet Take 81 mg by mouth daily.     Marland Kitchen atorvastatin (LIPITOR) 80 MG tablet TAKE 1 TABLET BY MOUTH  DAILY 90 tablet 1  . cetirizine (ZYRTEC) 10 MG tablet Take 10 mg by mouth daily.    . clonazePAM (KLONOPIN) 1 MG tablet TAKE 1 TABLET BY MOUTH  DAILY 90 tablet 0  . diclofenac sodium (VOLTAREN) 1 % GEL Apply 2 g topically 4 (four) times daily as needed (pain). 720 g 0  . ezetimibe (ZETIA) 10 MG tablet TAKE 1 TABLET BY MOUTH  DAILY 90 tablet 0  . fenofibrate (TRICOR) 145 MG tablet Take 1 tablet by mouth  daily 90 tablet 1  . fluticasone (FLONASE) 50 MCG/ACT nasal spray Place 2 sprays into both nostrils as needed for allergies or rhinitis.    . Fluticasone-Salmeterol (AIRDUO RESPICLICK 329/92) 426-83 MCG/ACT AEPB Inhale  1 puff into the lungs 2 (two) times daily. 1 each 3  . gabapentin (NEURONTIN) 300 MG capsule TAKE 1 CAPSULE BY MOUTH AT  6AM, 11:30AM AND 4:30PM AND TAKE 3 CAPSULES AT 10:00PM 540 capsule 1  . isosorbide  mononitrate (IMDUR) 30 MG 24 hr tablet Take 1 tablet (30 mg total) by mouth daily. 30 tablet 0  . ketoconazole (NIZORAL) 2 % shampoo APPLY TOPICALLY DAILY 360 mL 1  . lisinopril (PRINIVIL,ZESTRIL) 10 MG tablet TAKE 1 TABLET BY MOUTH  DAILY 90 tablet 0  . loperamide (IMODIUM) 2 MG capsule Take 1 capsule (2 mg total) by mouth as needed for diarrhea or loose stools. 12 capsule 0  . meclizine (ANTIVERT) 25 MG tablet Take 1 tablet (25 mg total) by mouth 3 (three) times daily as needed for dizziness. 30 tablet 0  . meloxicam (MOBIC) 7.5 MG tablet TAKE 1 TABLET BY MOUTH  DAILY 90 tablet 0  . metFORMIN (GLUCOPHAGE-XR) 500 MG 24 hr tablet TAKE 2 TABLETS BY MOUTH  DAILY WITH BREAKFAST 180 tablet 1  . metoprolol (LOPRESSOR) 50 MG tablet Take 1 tablet (50 mg total) by mouth once as directed. 1 tablet 0  . montelukast (SINGULAIR) 10 MG tablet TAKE 1 TABLET BY MOUTH AT  BEDTIME 90 tablet 1  . Multiple Vitamin (MULTIVITAMIN WITH MINERALS) TABS tablet Take 1 tablet by mouth daily.    . nitroGLYCERIN (NITROSTAT) 0.4 MG SL tablet Place 1 tablet (0.4 mg total) under the tongue every 5 (five) minutes as needed for chest pain. 30 tablet 1  . ONETOUCH VERIO test strip CHECK BLOOD SUGAR 3 TIMES  DAILY 300 each 3  . predniSONE (DELTASONE) 10 MG tablet Take 1 tablet (10 mg total) by mouth daily with breakfast. 20 tablet 0  . PROAIR HFA 108 (90 Base) MCG/ACT inhaler Inhale 2 puffs into the lungs every 6 (six) hours as needed for wheezing or shortness of breath. 3 Inhaler 1  . tamsulosin (FLOMAX) 0.4 MG CAPS capsule TAKE 1 CAPSULE BY MOUTH  DAILY (Patient taking differently: Take 0.4 mg by mouth daily. ) 90 capsule 1  . tiZANidine (ZANAFLEX) 2 MG tablet TAKE 1 TABLET BY MOUTH  EVERY 6 HOURS AS NEEDED FOR MUSCLE SPASM(S) 270  tablet 1  . traMADol (ULTRAM) 50 MG tablet TAKE 1 TABLET BY MOUTH  EVERY 6 HOURS AS NEEDED FOR SEVERE PAIN (Patient taking differently: Take 50 mg by mouth every 6 (six) hours as needed for severe pain. TAKE 1 TABLET BY MOUTH  EVERY 6 HOURS AS NEEDED FOR SEVERE PAIN) 120 tablet 4  . traZODone (DESYREL) 100 MG tablet TAKE 1 TABLET BY MOUTH AT  BEDTIME (Patient taking differently: Take 100 mg by mouth at bedtime. ) 90 tablet 1  . traZODone (DESYREL) 100 MG tablet TAKE 1 TABLET BY MOUTH AT  BEDTIME 90 tablet 0  . [DISCONTINUED] niacin (NIASPAN) 1000 MG CR tablet Take 1 tablet (1,000 mg total) by mouth at bedtime. 30 tablet 6   No current facility-administered medications on file prior to visit.     BP 114/71 (BP Location: Left Arm, Cuff Size: Large)   Pulse 94   Temp 98.4 F (36.9 C) (Oral)   Resp 16   Ht 5\' 8"  (1.727 m)   Wt 194 lb 3.2 oz (88.1 kg)   SpO2 100%   BMI 29.53 kg/m       Objective:   Physical Exam  Constitutional: He is oriented to person, place, and time. He appears well-developed and well-nourished. No distress.  HENT:  Head: Normocephalic and atraumatic.  Cardiovascular: Normal rate and regular rhythm.  No murmur heard. Pulmonary/Chest: Effort normal. No respiratory distress. He has wheezes. He has no rales.  Soft right-sided expiratory wheeze  Musculoskeletal: He exhibits  no edema.  Neurological: He is alert and oriented to person, place, and time.  Skin: Skin is warm and dry.  Psychiatric: He has a normal mood and affect. His behavior is normal. Thought content normal.          Assessment & Plan:  Diabetes type 2-maintained on metformin.  Clinically stable.  Will check follow-up A1c.  Asthma- he has a soft wheeze today.  A chest x-ray was performed and is negative for pneumonia.  He reports that he is unable to afford the airduo since it is a high-level tear for him.  Plan to continue albuterol. Add symbicort to see if this is more affordable for him.    Hyperlipidemia-we will check follow-up lipid panel.  Continue current meds.  Depression/fibromyalgia-I think he could benefit from addition of Cymbalta.  He is already on Zoloft.  We will plan to cut back his Zoloft dose from 100 mg to 50 mg and add low-dose Cymbalta.  We will see how he was doing next visit and if his symptoms are stable we will consider further titration upward of Cymbalta and off of Zoloft.  In the meantime we did discuss the possible increased risk of serotonin syndrome due to his ongoing use of tramadol.  We discussed risks and benefits and he wishes to proceed with trial of Cymbalta due to his chronic fibro-pain.  We also discussed discontinuing use of ibuprofen which she has been using for some dental pain and instead using acetaminophen.  This is due to increased risk of GI bleed on SSRI.  He reports sparing use of meloxicam.

## 2018-07-31 NOTE — Patient Instructions (Addendum)
Cut zoloft in half and take 1/2 tab once daily, Begin cymbalta 30mg  once daily.  Stop ibuprofen and limit your use of meloxicam. Instead you may use tylenol 1000mg  2-3 times daily.  Let me know if you have any issues with mood during medication changes.

## 2018-08-03 ENCOUNTER — Ambulatory Visit: Payer: Self-pay | Admitting: Registered Nurse

## 2018-08-09 ENCOUNTER — Encounter: Payer: Medicare Other | Admitting: Registered Nurse

## 2018-08-19 ENCOUNTER — Other Ambulatory Visit: Payer: Self-pay | Admitting: Family

## 2018-08-19 DIAGNOSIS — M545 Low back pain, unspecified: Secondary | ICD-10-CM

## 2018-08-24 ENCOUNTER — Other Ambulatory Visit: Payer: Self-pay | Admitting: Pulmonary Disease

## 2018-08-27 NOTE — Telephone Encounter (Signed)
Received a refill request for Losartan.  Per last OV on 03/12/18, patient was supposed to come back for follow up in 4-6 weeks. Patient has not been back and appears to be following up with PCP.  I attempted to call patient to ask if he would like to schedule follow up appointment so that we will be able to send in refill but reached a busy signal. Will try to call patient back at another time.

## 2018-08-28 NOTE — Telephone Encounter (Signed)
Patient has been having primary care filling this Rx. Will close encounter.

## 2018-08-31 ENCOUNTER — Ambulatory Visit: Payer: Self-pay | Admitting: Family

## 2018-09-04 ENCOUNTER — Ambulatory Visit (INDEPENDENT_AMBULATORY_CARE_PROVIDER_SITE_OTHER): Payer: Medicare Other | Admitting: Family

## 2018-09-04 ENCOUNTER — Encounter: Payer: Self-pay | Admitting: Family

## 2018-09-04 VITALS — BP 116/69 | HR 101 | Temp 98.8°F | Resp 16 | Ht 68.0 in | Wt 194.0 lb

## 2018-09-04 DIAGNOSIS — M797 Fibromyalgia: Secondary | ICD-10-CM | POA: Diagnosis not present

## 2018-09-04 DIAGNOSIS — J45901 Unspecified asthma with (acute) exacerbation: Secondary | ICD-10-CM

## 2018-09-04 MED ORDER — FLUTICASONE-SALMETEROL 250-50 MCG/DOSE IN AEPB: 1.0000 | INHALATION_SPRAY | Freq: Two times a day (BID) | RESPIRATORY_TRACT | 5 refills | Status: DC

## 2018-09-04 MED ORDER — PREDNISONE 10 MG PO TABS: ORAL_TABLET | ORAL | 0 refills | Status: DC

## 2018-09-04 MED ORDER — DULOXETINE HCL 30 MG PO CPEP: 30.0000 mg | ORAL_CAPSULE | Freq: Every day | ORAL | 1 refills | Status: DC

## 2018-09-04 NOTE — Patient Instructions (Signed)
Cut zoloft in half and take 1/2 tab once daily. Start cymbalta 30mg . Start wixela inhaler twice daily and add prednisone taper.  Call if symptoms worsen or if symptoms fail to improve.

## 2018-09-04 NOTE — Progress Notes (Signed)
   Subjective:    Patient ID: Clemons Salvucci, male    DOB: [birth year], 47 y.o.   MRN: [id number]  HPI  Patient is a 47 yr old male who presents today for follow up.    Depression- Last visit we cut back on his zoloft from 100mg  to 50mg  and added low dose cymbalta.  He states that he was unable to get the cymbalta from his mail order.   He reports some mild tremors in his hands.   Notes that he has been using nitro more often which his cardiologist is aware of.   Patient reports a 2 month history of cough which is productive of dark yellow phlegm. Not taking symbicort due to cost.  Continues singulair.    Review of Systems     Objective:   Physical Exam Constitutional:      General: He is not in acute distress.    Appearance: He is well-developed.  HENT:     Head: Normocephalic and atraumatic.  Cardiovascular:     Rate and Rhythm: Normal rate and regular rhythm.     Heart sounds: No murmur.  Pulmonary:     Effort: Pulmonary effort is normal. No respiratory distress.     Breath sounds: Wheezing present. No rales.  Skin:    General: Skin is warm and dry.  Neurological:     Mental Status: He is alert and oriented to person, place, and time.  Psychiatric:        Behavior: Behavior normal.        Thought Content: Thought content normal.           Assessment & Plan:  Asthma with acute exacerbation- will rx with albuterol q6h, prednisone taper, wixela- which will hopefully be less expensive for him.  Fibromyalgia- will send cymbalt to his local pharmacy.  Advised pt as follows:  Cut zoloft in half and take 1/2 tab once daily. Start cymbalta 30mg .

## 2018-09-28 ENCOUNTER — Ambulatory Visit: Payer: Medicare Other | Admitting: Cardiovascular Disease

## 2018-10-01 ENCOUNTER — Other Ambulatory Visit: Payer: Self-pay | Admitting: Family

## 2018-10-01 ENCOUNTER — Other Ambulatory Visit: Payer: Self-pay | Admitting: Physical Medicine & Rehabilitation

## 2018-10-01 DIAGNOSIS — M545 Low back pain, unspecified: Secondary | ICD-10-CM

## 2018-10-02 ENCOUNTER — Telehealth: Payer: Self-pay | Admitting: Family

## 2018-10-02 NOTE — Telephone Encounter (Signed)
Clonazepam refill sent to his pharmacy. However he is due for UDS and updated controlled substance contract. Can you please arrange lab visit and update?

## 2018-10-02 NOTE — Telephone Encounter (Signed)
Cody Raymond -- please review sertraline dose.  Recent request is for 100mg  once a day. Last attempted refill was dated 07/31/18 and directions said 1/2 tablet daily. Which dose is pt supposed to be taking?  Last clonazepam RX: 07/03/18, #90. Not filled until 09/04/18 Last OV:  09/04/18 Next OV: none scheduled UDS: 09/29/17 w/PCP, 02/01/18 w/Dr Ella Bodo [facility]: 09/29/17, past due CSR: Please see database regarding last fill date of clonazepam?

## 2018-10-03 ENCOUNTER — Other Ambulatory Visit: Payer: Self-pay

## 2018-10-03 DIAGNOSIS — F329 Major depressive disorder, single episode, unspecified: Secondary | ICD-10-CM

## 2018-10-03 DIAGNOSIS — F32A Depression, unspecified: Secondary | ICD-10-CM

## 2018-10-03 NOTE — Telephone Encounter (Signed)
Patient scheduled for Monday 10-08-18 for uds. Contract will be at the front desk for him to sign.

## 2018-10-04 ENCOUNTER — Other Ambulatory Visit: Payer: Self-pay | Admitting: Family

## 2018-10-04 DIAGNOSIS — M545 Low back pain, unspecified: Secondary | ICD-10-CM

## 2018-10-04 DIAGNOSIS — J45909 Unspecified asthma, uncomplicated: Secondary | ICD-10-CM

## 2018-10-04 DIAGNOSIS — E119 Type 2 diabetes mellitus without complications: Secondary | ICD-10-CM

## 2018-10-04 DIAGNOSIS — E785 Hyperlipidemia, unspecified: Secondary | ICD-10-CM

## 2018-10-06 ENCOUNTER — Emergency Department (HOSPITAL_COMMUNITY): Payer: Medicare Other

## 2018-10-06 ENCOUNTER — Encounter (HOSPITAL_COMMUNITY): Payer: Self-pay | Admitting: Emergency Medicine

## 2018-10-06 ENCOUNTER — Emergency Department (HOSPITAL_COMMUNITY)
Admission: EM | Admit: 2018-10-06 | Discharge: 2018-10-06 | Disposition: A | Discharge status: Home / Self Care | Payer: Medicare Other | Attending: Emergency Medicine | Admitting: Emergency Medicine

## 2018-10-06 DIAGNOSIS — R1084 Generalized abdominal pain: Secondary | ICD-10-CM | POA: Insufficient documentation

## 2018-10-06 DIAGNOSIS — I2510 Atherosclerotic heart disease of native coronary artery without angina pectoris: Secondary | ICD-10-CM | POA: Insufficient documentation

## 2018-10-06 DIAGNOSIS — I10 Essential (primary) hypertension: Secondary | ICD-10-CM | POA: Diagnosis not present

## 2018-10-06 DIAGNOSIS — R072 Precordial pain: Secondary | ICD-10-CM | POA: Diagnosis present

## 2018-10-06 DIAGNOSIS — Z79899 Other long term (current) drug therapy: Secondary | ICD-10-CM | POA: Diagnosis not present

## 2018-10-06 DIAGNOSIS — Z7982 Long term (current) use of aspirin: Secondary | ICD-10-CM | POA: Insufficient documentation

## 2018-10-06 DIAGNOSIS — E119 Type 2 diabetes mellitus without complications: Secondary | ICD-10-CM | POA: Diagnosis not present

## 2018-10-06 DIAGNOSIS — R197 Diarrhea, unspecified: Secondary | ICD-10-CM | POA: Insufficient documentation

## 2018-10-06 DIAGNOSIS — E785 Hyperlipidemia, unspecified: Secondary | ICD-10-CM | POA: Insufficient documentation

## 2018-10-06 DIAGNOSIS — J45909 Unspecified asthma, uncomplicated: Secondary | ICD-10-CM | POA: Insufficient documentation

## 2018-10-06 DIAGNOSIS — R112 Nausea with vomiting, unspecified: Secondary | ICD-10-CM | POA: Diagnosis not present

## 2018-10-06 DIAGNOSIS — Z7984 Long term (current) use of oral hypoglycemic drugs: Secondary | ICD-10-CM | POA: Insufficient documentation

## 2018-10-06 DIAGNOSIS — R0789 Other chest pain: Secondary | ICD-10-CM

## 2018-10-06 LAB — BASIC METABOLIC PANEL
Anion gap: 12 (ref 5–15)
BUN: 14 mg/dL (ref 6–20)
CO2: 21 mmol/L — ABNORMAL LOW (ref 22–32)
Calcium: 8.7 mg/dL — ABNORMAL LOW (ref 8.9–10.3)
Chloride: 101 mmol/L (ref 98–111)
Creatinine, Ser: 0.84 mg/dL (ref 0.61–1.24)
GFR calc Af Amer: >60 mL/min (ref >60)
GFR calc non Af Amer: >60 mL/min (ref >60)
Glucose, Bld: 209 mg/dL — ABNORMAL HIGH (ref 70–99)
POTASSIUM: 4 mmol/L (ref 3.5–5.1)
Sodium: 134 mmol/L — ABNORMAL LOW (ref 135–145)

## 2018-10-06 LAB — CBC
HCT: 42.1 % (ref 39.0–52.0)
HEMOGLOBIN: 13.6 g/dL (ref 13.0–17.0)
MCH: 26.3 pg (ref 26.0–34.0)
MCHC: 32.3 g/dL (ref 30.0–36.0)
MCV: 81.3 fL (ref 80.0–100.0)
Platelets: 275 10*3/uL (ref 150–400)
RBC: 5.18 MIL/uL (ref 4.22–5.81)
RDW: 15.6 % — ABNORMAL HIGH (ref 11.5–15.5)
WBC: 8.7 10*3/uL (ref 4.0–10.5)
nRBC: 0 % (ref 0.0–0.2)

## 2018-10-06 LAB — I-STAT TROPONIN, ED: Troponin i, poc: 0 ng/mL (ref 0.00–0.08)

## 2018-10-06 MED ORDER — ONDANSETRON HCL 4 MG/2ML IJ SOLN
4.0000 mg | Freq: Once | INTRAMUSCULAR | Status: AC
  Administered 2018-10-06: 4 mg via INTRAVENOUS
  Filled 2018-10-06: qty 2

## 2018-10-06 MED ORDER — ALUM & MAG HYDROXIDE-SIMETH 200-200-20 MG/5ML PO SUSP
30.0000 mL | Freq: Once | ORAL | Status: AC
  Administered 2018-10-06: 30 mL via ORAL
  Filled 2018-10-06: qty 30

## 2018-10-06 MED ORDER — SODIUM CHLORIDE 0.9% FLUSH: 3.0000 mL | Freq: Once | INTRAVENOUS | Status: DC

## 2018-10-06 MED ORDER — SODIUM CHLORIDE 0.9 % IV BOLUS
1000.0000 mL | Freq: Once | INTRAVENOUS | Status: AC
  Administered 2018-10-06: 1000 mL via INTRAVENOUS

## 2018-10-06 MED ORDER — ONDANSETRON 4 MG PO TBDP: 4.0000 mg | ORAL_TABLET | Freq: Three times a day (TID) | ORAL | 0 refills | Status: AC | PRN

## 2018-10-06 NOTE — ED Triage Notes (Signed)
  Patient BIB EMS for chest pain and SOB that has been going on the for the past two days.  Patient states he takes 4 nitro tabs a day for his angina.  Patient has chest pain on L side radiating to L shoulder.  Patient states he has had N/V.  Patient A&O x4.

## 2018-10-06 NOTE — ED Provider Notes (Signed)
[facility] EMERGENCY DEPARTMENT Provider Note  CSN: [id number] Arrival date & time: 10/06/18 0010  Chief Complaint(s) Chest Pain and Shortness of Breath  HPI Cody Raymond is a 48 y.o. male    Chest Pain  Pain location:  Substernal area Pain quality: pressure and sharp   Pain radiates to:  L shoulder Pain severity:  Moderate Onset quality:  Gradual Duration:  2 days Timing:  Constant Progression:  Unchanged Chronicity:  Chronic Relieved by:  Nothing Worsened by:  Nothing Associated symptoms: abdominal pain, fatigue, nausea, shortness of breath and vomiting   Associated symptoms: no cough, no diaphoresis and no fever   Associated symptoms comment:  Diarrhea  Risk factors: coronary artery disease (nonobs in 2011), diabetes mellitus, high cholesterol, hypertension and male sex   Risk factors: no smoking   Shortness of Breath  Associated symptoms: abdominal pain, chest pain and vomiting   Associated symptoms: no cough, no diaphoresis and no fever   Abdominal Pain  Pain location:  Generalized Pain quality: not cramping   Pain radiates to:  Does not radiate Pain severity:  Moderate Onset quality:  Gradual Duration:  2 days Timing:  Constant Progression:  Waxing and waning Chronicity:  New Context: sick contacts   Context: not eating and not recent illness   Relieved by:  Nothing Worsened by:  Nothing Associated symptoms: chest pain, diarrhea, fatigue, nausea, shortness of breath and vomiting   Associated symptoms: no cough and no fever       Past Medical History Past Medical History:  Diagnosis Date  . Aneurysm ([place])   . Asthma   . Blood transfusion   . CAD (coronary artery disease), non obstructive on cath 2011 04/05/2012  . Coronary artery disease   . Diabetes mellitus   . Enlarged prostate   . GERD (gastroesophageal reflux disease)   . H/O syncope   . Heart disease   . History of repair of patent ductus arteriosus 07/23/2016  .  Hyperlipidemia   . Hypertension   . 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 Refusal of blood transfusions as patient is Jehovah's Witness    Patient Active Problem List   Diagnosis Date Noted  . Right shoulder injury, initial encounter 06/18/2017  . Hypertrophic cardiomyopathy ([place]) 04/17/2017  . Trochanteric bursitis of right hip 10/17/2016  . History of repair of patent ductus arteriosus 07/23/2016  . Cervicalgia 07/08/2016  . Fibromyalgia 07/08/2016  . Cervical spondylosis without myelopathy 07/08/2016  . HTN (hypertension) 02/08/2016  . Lumbar facet arthropathy 10/19/2015  . Chronic lumbar radiculopathy 06/22/2015  . History of syncope 10/20/2014  . Urinary frequency 07/08/2014  . Allergic rhinitis 07/08/2014  . Lumbar radiculopathy, chronic 07/26/2013  . Morton's neuroma of left foot 03/18/2013  . Depression with anxiety 12/21/2012  . Insomnia 07/23/2012  . CAD (coronary artery disease), non obstructive on cath 2011 04/05/2012  . Right bundle branch block 04/04/2012  . Peripheral neuropathy 04/04/2012  . Chronic back pain 03/28/2011  . Palpitations 03/01/2011  . 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 GERD 09/07/2010  . Coronary artery spasm ([place]) 01/31/2008  . OBESITY, NOS 11/16/2006  . ERECTILE DYSFUNCTION 11/16/2006  . Asthma with acute exacerbation 11/16/2006   Home Medication(s) Prior to Admission medications   Medication Sig Start Date End Date Taking? Authorizing Provider  albuterol (PROVENTIL) (2.5 MG/3ML) 0.083% nebulizer solution USE 1 VIAL VIA NEBULIZER  EVERY 6 HOURS AS NEEDED FOR WHEEZING OR SHORTNESS OF  BREATH  Patient taking differently: Take 2.5 mg by nebulization every 6 (six) hours as needed for wheezing or shortness of breath.  04/17/18  Yes [name], NP  amoxicillin (AMOXIL) 500 MG capsule Take 500 mg by mouth 2 (two) times daily.   Yes [provider]  aspirin 81 MG tablet Take 81 mg by mouth  daily.    Yes [provider]  atorvastatin (LIPITOR) 80 MG tablet TAKE 1 TABLET BY MOUTH  DAILY Patient taking differently: Take 80 mg by mouth daily.  06/25/18  Yes [name], NP  cetirizine (ZYRTEC) 10 MG tablet Take 10 mg by mouth daily.   Yes [provider]  clonazePAM (KLONOPIN) 1 MG tablet TAKE 1 TABLET BY MOUTH  DAILY Patient taking differently: Take 1 mg by mouth daily.  10/02/18  Yes [name], NP  diclofenac sodium (VOLTAREN) 1 % GEL Apply 2 g topically 4 (four) times daily as needed (pain). 10/04/17  Yes [name], NP  DULoxetine (CYMBALTA) 30 MG capsule TAKE 1 CAPSULE BY MOUTH  DAILY Patient taking differently: Take 30 mg by mouth daily.  10/02/18  Yes [name], NP  ezetimibe (ZETIA) 10 MG tablet TAKE 1 TABLET BY MOUTH  DAILY Patient taking differently: Take 10 mg by mouth daily.  10/02/18  Yes [name], NP  fenofibrate (TRICOR) 145 MG tablet Take 1 tablet by mouth  daily 08/10/15  Yes [name], NP  fluticasone (FLONASE) 50 MCG/ACT nasal spray Place 2 sprays into both nostrils as needed for allergies or rhinitis.   Yes [provider]  Fluticasone-Salmeterol (WIXELA INHUB) 250-50 MCG/DOSE AEPB Inhale 1 puff into the lungs 2 (two) times daily. 09/04/18  Yes [name], NP  gabapentin (NEURONTIN) 300 MG capsule TAKE 1 CAPSULE BY MOUTH AT  6AM, 11:30AM AND 4:30PM AND TAKE 3 CAPSULES AT 10:00PM 06/25/18  Yes [name], NP  isosorbide mononitrate (IMDUR) 30 MG 24 hr tablet Take 1 tablet (30 mg total) by mouth daily. 10/26/16  Yes Hedges, Dellis Filbert, PA-C  ketoconazole (NIZORAL) 2 % shampoo APPLY TOPICALLY DAILY AS  DIRECTED Patient taking differently: Apply 1 application topically 3 (three) times a week.  10/05/18  Yes [name], NP  lisinopril (PRINIVIL,ZESTRIL) 10 MG tablet TAKE 1 TABLET BY MOUTH  DAILY Patient taking differently: Take 10 mg by mouth daily.  10/02/18  Yes  [name], NP  loperamide (IMODIUM) 2 MG capsule Take 1 capsule (2 mg total) by mouth as needed for diarrhea or loose stools. 01/20/16  Yes Horton, Barbette Hair, MD  meclizine (ANTIVERT) 25 MG tablet Take 1 tablet (25 mg total) by mouth 3 (three) times daily as needed for dizziness. 06/17/18  Yes Deno Etienne, DO  meloxicam (MOBIC) 7.5 MG tablet TAKE 1 TABLET BY MOUTH  DAILY Patient taking differently: Take 7.5 mg by mouth daily.  10/02/18  Yes [name], NP  metFORMIN (GLUCOPHAGE-XR) 500 MG 24 hr tablet TAKE 2 TABLETS BY MOUTH  DAILY WITH BREAKFAST Patient taking differently: Take 500 mg by mouth 2 (two) times daily.  06/25/18  Yes [name], NP  metoprolol (LOPRESSOR) 50 MG tablet Take 1 tablet (50 mg total) by mouth once as directed. 11/04/16  Yes Croitoru, Mihai, MD  montelukast (SINGULAIR) 10 MG tablet TAKE 1 TABLET BY MOUTH AT  BEDTIME Patient taking differently: Take 10 mg by mouth at bedtime.  06/25/18  Yes [name], NP  Multiple Vitamin (MULTIVITAMIN WITH MINERALS) TABS tablet Take 1 tablet by mouth daily.   Yes [provider]  nitroGLYCERIN (NITROSTAT) 0.4 MG SL tablet Place 1 tablet (0.4 mg total) under the tongue every 5 (five) minutes as needed for chest pain. 11/11/15  Yes [name], NP  ONETOUCH VERIO test strip USE WITH METER TO CHECK  BLOOD SUGAR 3 TIMES DAILY 10/02/18  Yes [name], NP  PROAIR HFA 108 [phone] Base) MCG/ACT inhaler Inhale 2 puffs into the lungs every 6 (six) hours as needed for wheezing or shortness of breath. 02/08/18  Yes [name], NP  sertraline (ZOLOFT) 100 MG tablet TAKE 1 TABLET BY MOUTH  DAILY Patient taking differently: Take 100 mg by mouth daily.  10/02/18  Yes [name], NP  tamsulosin (FLOMAX) 0.4 MG CAPS capsule TAKE 1 CAPSULE BY MOUTH  DAILY Patient taking differently: Take 0.4 mg by mouth daily.  10/02/18  Yes [name], NP  tiZANidine (ZANAFLEX) 2 MG tablet TAKE 1  TABLET BY MOUTH  EVERY 6 HOURS AS NEEDED FOR MUSCLE SPASM(S) Patient taking differently: Take 2 mg by mouth every 6 (six) hours as needed for muscle spasms.  07/19/18  Yes Kirsteins, Luanna Salk, MD  traMADol (ULTRAM) 50 MG tablet TAKE 1 TABLET BY MOUTH  EVERY 6 HOURS AS NEEDED FOR SEVERE PAIN Patient taking differently: Take 50 mg by mouth every 6 (six) hours as needed for severe pain. TAKE 1 TABLET BY MOUTH  EVERY 6 HOURS AS NEEDED FOR SEVERE PAIN 02/01/18  Yes Kirsteins, Luanna Salk, MD  traZODone (DESYREL) 100 MG tablet TAKE 1 TABLET BY MOUTH AT  BEDTIME Patient taking differently: Take 100 mg by mouth at bedtime.  08/20/18  Yes [name], NP  ondansetron (ZOFRAN ODT) 4 MG disintegrating tablet Take 1 tablet (4 mg total) by mouth every 8 (eight) hours as needed for up to 3 days for nausea or vomiting. 10/06/18 10/09/18  Fatima Blank, MD  predniSONE (DELTASONE) 10 MG tablet 4 tabs by mouth once daily for 2 days, then 3 tabs daily x 2 days, then 2 tabs daily x 2 days, then 1 tab daily x 2 days Patient not taking: Reported on 10/06/2018 09/04/18   [name], NP  niacin (NIASPAN) 1000 MG CR tablet Take 1 tablet (1,000 mg total) by mouth at bedtime. 07/27/11 11/08/17  Burnice Logan, MD                                                                                                                                    Past Surgical History Past Surgical History:  Procedure Laterality Date  . APPENDECTOMY    . BACK SURGERY    . CARDIAC CATHETERIZATION  2007   Clean Cardiac Cath ([place]), with RCA 30% narrowing likely due to catheter induced spasm in 09/02/10.  [facility] Kitchen CARDIAC CATHETERIZATION  09/02/2010   mod. nonobstructive disease in the RCA and CX, tortuous LAD  . COLONOSCOPY W/ POLYPECTOMY  03/17/11   diminutive polyp  . LOOP RECORDER  EXPLANT N/A 01/14/2014   Procedure: LOOP RECORDER EXPLANT;  Surgeon: Sanda Klein, MD;  Location: [facility] CATH LAB;  Service: Cardiovascular;   Laterality: N/A;  . LOOP RECORDER IMPLANT  04/06/2012   Reveal XT 9833  . LOOP RECORDER IMPLANT N/A 04/06/2012   Procedure: LOOP RECORDER IMPLANT;  Surgeon: Sanda Klein, MD;  Location: [facility] CATH LAB;  Service: Cardiovascular;  Laterality: N/A;  . NM MYOCAR PERF WALL MOTION  01/25/2008   mild anteroapical wall ischemia  . PATENT DUCTUS ARTERIOUS REPAIR     at age 32   Family History Family History  Problem Relation Age of Onset  . Colon cancer Paternal Grandfather   . Prostate cancer Paternal Grandfather   . Aneurysm Father   . Heart attack Father 78  . Coronary artery disease Father   . Colon polyps Mother   . Heart disease Mother   . Coronary artery disease Mother   . Migraines Mother   . Breast cancer Maternal Grandmother   . Colon cancer Paternal Uncle        x 2  . Stomach cancer Brother   . Liver disease Unknown        unsure who it was  . Allergies Daughter     Social History Social History   Tobacco Use  . Smoking status: Never Smoker  . Smokeless tobacco: Never Used  Substance Use Topics  . Alcohol use: No  . Drug use: No   Allergies Benadryl [diphenhydramine hcl]  Review of Systems Review of Systems  Constitutional: Positive for fatigue. Negative for diaphoresis and fever.  Respiratory: Positive for shortness of breath. Negative for cough.   Cardiovascular: Positive for chest pain.  Gastrointestinal: Positive for abdominal pain, diarrhea, nausea and vomiting.   All other systems are reviewed and are negative for acute change except as noted in the HPI  Physical Exam Vital Signs  I have reviewed the triage vital signs BP 113/75   Pulse (!) 109   Temp 98.4 F (36.9 C) (Oral)   Resp 14   Ht 5\' 8"  (1.727 m)   Wt 86.2 kg   SpO2 99%   BMI 28.89 kg/m   Physical Exam Vitals signs reviewed.  Constitutional:      General: He is not in acute distress.    Appearance: He is well-developed. He is not diaphoretic.  HENT:     Head: Normocephalic and  atraumatic.     Nose: Nose normal.  Eyes:     General: No scleral icterus.       Right eye: No discharge.        Left eye: No discharge.     Conjunctiva/sclera: Conjunctivae normal.     Pupils: Pupils are equal, round, and reactive to light.  Neck:     Musculoskeletal: Normal range of motion and neck supple.  Cardiovascular:     Rate and Rhythm: Normal rate and regular rhythm.     Heart sounds: No murmur. No friction rub. No gallop.   Pulmonary:     Effort: Pulmonary effort is normal. No respiratory distress.     Breath sounds: Normal breath sounds. No stridor. No rales.  Abdominal:     General: There is no distension.     Palpations: Abdomen is soft.     Tenderness: There is generalized abdominal tenderness (discomfort). There is no guarding or rebound.  Musculoskeletal:        General: No tenderness.  Skin:    General: Skin is warm and dry.  Findings: No erythema or rash.  Neurological:     Mental Status: He is alert and oriented to person, place, and time.     ED Results and Treatments Labs (all labs ordered are listed, but only abnormal results are displayed) Labs Reviewed  BASIC METABOLIC PANEL - Abnormal; Notable for the following components:      Result Value   Sodium 134 (*)    CO2 21 (*)    Glucose, Bld 209 (*)    Calcium 8.7 (*)    All other components within normal limits  CBC - Abnormal; Notable for the following components:   RDW 15.6 (*)    All other components within normal limits  I-STAT TROPONIN, ED                                                                                                                         EKG  EKG Interpretation  Date/Time:  Saturday October 06 2018 00:12:24 EST Ventricular Rate:  105 PR Interval:    QRS Duration: 94 QT Interval:  339 QTC Calculation: 448 R Axis:   -140 Text Interpretation:  Sinus tachycardia Right ventricular hypertrophy No significant change since last tracing Confirmed by Addison Lank  [phone]) on 10/06/2018 12:38:30 AM      Radiology Dg Chest 2 View  Result Date: 10/06/2018 CLINICAL DATA:  Chest pain EXAM: CHEST - 2 VIEW COMPARISON:  07/31/2018 FINDINGS: The heart size and mediastinal contours are within normal limits. Both lungs are clear. The visualized skeletal structures are unremarkable. IMPRESSION: No active cardiopulmonary disease. Electronically Signed   By: Ulyses Jarred M.D.   On: 10/06/2018 01:03   Pertinent labs & imaging results that were available during my care of the patient were reviewed by me and considered in my medical decision making (see chart for details).  Medications Ordered in ED Medications  sodium chloride flush (NS) 0.9 % injection 3 mL (has no administration in time range)  sodium chloride 0.9 % bolus 1,000 mL (0 mLs Intravenous Stopped 10/06/18 0239)  ondansetron (ZOFRAN) injection 4 mg (4 mg Intravenous Given 10/06/18 0138)  alum & mag hydroxide-simeth (MAALOX/MYLANTA) 200-200-20 MG/5ML suspension 30 mL (30 mLs Oral Given 10/06/18 0138)                                                                                                                                    Procedures Procedures  (  including critical care time)  Medical Decision Making / ED Course I have reviewed the nursing notes for this encounter and the patient's prior records (if available in EHR or on provided paperwork).    1.  Chest pain Atypical chest pain.  EKG without acute ischemic changes or evidence of pericarditis.  Initial troponin negative.  Given the duration of patient's symptoms, feel that the negative troponin is sufficient to rule out ACS at this time.  Given his additional GI symptoms I believe that his chest pain might be GI related.  Doubt pulmonary embolism.  Presentation not classic for aortic dissection or esophageal perforation.  Chest x-ray without evidence suggestive of pneumonia, pneumothorax, pneumomediastinum.  No abnormal contour of the  mediastinum to suggest dissection. No evidence of acute injuries.  2.  Abdominal discomfort with nausea, vomiting, and diarrhea. Labs grossly reassuring without leukocytosis or anemia.  No significant electrolyte derangements or renal sufficiency.  Patient has hyperglycemia without evidence of DKA.  Abdomen with diffuse discomfort but no evidence of peritonitis.  Treated symptomatically with IV fluids, nausea medicine and GI cocktail resulting in complete resolution of the patient's chest pain and abdominal discomfort.  Doubt serious intra-abdominal inflammatory/infectious process requiring further imaging at this time.  The patient appears reasonably screened and/or stabilized for discharge and I doubt any other medical condition or other [facility] requiring further screening, evaluation, or treatment in the ED at this time prior to discharge.  The patient is safe for discharge with strict return precautions.   Final Clinical Impression(s) / ED Diagnoses Final diagnoses:  Generalized abdominal pain  Nausea vomiting and diarrhea  Atypical chest pain    Disposition: Discharge  Condition: Good  I have discussed the results, Dx and Tx plan with the patient who expressed understanding and agree(s) with the plan. Discharge instructions discussed at great length. The patient was given strict return precautions who verbalized understanding of the instructions. No further questions at time of discharge.    ED Discharge Orders         Ordered    ondansetron (ZOFRAN ODT) 4 MG disintegrating tablet  Every 8 hours PRN     10/06/18 0259           Follow Up: [name], NP [address] [place] [address] [phone]  Schedule an appointment as soon as possible for a visit  in 3-5 days, If symptoms do not improve or  worsen     This chart was dictated using voice recognition software.  Despite best efforts to proofread,  errors can occur which can change the  documentation meaning.   Fatima Blank, MD 10/06/18 [phone]

## 2018-10-06 NOTE — Discharge Instructions (Signed)

## 2018-10-08 ENCOUNTER — Other Ambulatory Visit: Payer: Self-pay

## 2018-10-23 ENCOUNTER — Encounter: Payer: Self-pay | Admitting: Cardiovascular Disease

## 2018-10-23 ENCOUNTER — Ambulatory Visit: Payer: Medicare Other | Admitting: Cardiovascular Disease

## 2018-10-23 VITALS — BP 108/76 | HR 85 | Ht 68.0 in | Wt 196.8 lb

## 2018-10-23 DIAGNOSIS — I2510 Atherosclerotic heart disease of native coronary artery without angina pectoris: Secondary | ICD-10-CM

## 2018-10-23 DIAGNOSIS — I10 Essential (primary) hypertension: Secondary | ICD-10-CM | POA: Diagnosis not present

## 2018-10-23 DIAGNOSIS — E782 Mixed hyperlipidemia: Secondary | ICD-10-CM | POA: Diagnosis not present

## 2018-10-23 DIAGNOSIS — R079 Chest pain, unspecified: Secondary | ICD-10-CM

## 2018-10-23 DIAGNOSIS — E785 Hyperlipidemia, unspecified: Secondary | ICD-10-CM | POA: Insufficient documentation

## 2018-10-23 DIAGNOSIS — I712 Thoracic aortic aneurysm, without rupture, unspecified: Secondary | ICD-10-CM | POA: Insufficient documentation

## 2018-10-23 MED ORDER — METOPROLOL TARTRATE 100 MG PO TABS: 100.0000 mg | ORAL_TABLET | Freq: Once | ORAL | 0 refills | Status: DC

## 2018-10-23 NOTE — Assessment & Plan Note (Signed)
History of essential hypertension with blood pressure measured today 108/76.  He is on lisinopril and metoprolol.  Continue current meds at current dosing.

## 2018-10-23 NOTE — Progress Notes (Signed)
10/23/2018 [name]   [birth year]  [id number]  Primary Physician [name], NP Primary Cardiologist: Lorretta Harp MD [name], [place]  HPI:  Cody Raymond is a 47 y.o. mild to moderately overweight married Latino male father of 2 daughters, grandfather 2 grandchildren is accompanied by his wife Tammy today.  He is referred to be established in our practice for ongoing cardiovascular care because of chest pain.  He was previously a patient of Dr. Sallyanne Kuster , then Dr. Einar Gip and now is transferring to me because of insurance issues.  His cardiac risk factors are notable for treated hypertension, diabetes and hyperlipidemia.  He is a Restaurant manager, fast food.  He is never smoked.  His mother did have CABG.  He is never had a heart attack or stroke.  Does get fairly constant daily chest pain.  He had a heart cath in 2011 that showed nonobstructive CAD in the circumflex and RCA and negative Myoview in 2016.  His mother did have HOCM and had a septal myectomy.  He has LVH without asymmetric thickening.  He has been out of work for 68 half years and has been disabled.  He has had syncope in the past with a negative work-up including loop implantation and explantation.  He was recently seen in the emergency room with chest pain 10/06/2018 with negative work-up.   Current Meds  Medication Sig  . albuterol (PROVENTIL) (2.5 MG/3ML) 0.083% nebulizer solution USE 1 VIAL VIA NEBULIZER  EVERY 6 HOURS AS NEEDED FOR WHEEZING OR SHORTNESS OF  BREATH (Patient taking differently: Take 2.5 mg by nebulization every 6 (six) hours as needed for wheezing or shortness of breath. )  . amoxicillin (AMOXIL) 500 MG capsule Take 500 mg by mouth 2 (two) times daily.  Marland Kitchen aspirin 81 MG tablet Take 81 mg by mouth daily.   Marland Kitchen atorvastatin (LIPITOR) 80 MG tablet TAKE 1 TABLET BY MOUTH  DAILY (Patient taking differently: Take 80 mg by mouth daily. )  . cetirizine (ZYRTEC) 10 MG tablet Take 10 mg by mouth daily.    . clonazePAM (KLONOPIN) 1 MG tablet TAKE 1 TABLET BY MOUTH  DAILY (Patient taking differently: Take 1 mg by mouth daily. )  . diclofenac sodium (VOLTAREN) 1 % GEL Apply 2 g topically 4 (four) times daily as needed (pain).  . DULoxetine (CYMBALTA) 30 MG capsule TAKE 1 CAPSULE BY MOUTH  DAILY (Patient taking differently: Take 30 mg by mouth daily. )  . ezetimibe (ZETIA) 10 MG tablet TAKE 1 TABLET BY MOUTH  DAILY (Patient taking differently: Take 10 mg by mouth daily. )  . fenofibrate (TRICOR) 145 MG tablet Take 1 tablet by mouth  daily  . fluticasone (FLONASE) 50 MCG/ACT nasal spray Place 2 sprays into both nostrils as needed for allergies or rhinitis.  . Fluticasone-Salmeterol (WIXELA INHUB) 250-50 MCG/DOSE AEPB Inhale 1 puff into the lungs 2 (two) times daily.  Marland Kitchen gabapentin (NEURONTIN) 300 MG capsule TAKE 1 CAPSULE BY MOUTH AT  6AM, 11:30AM AND 4:30PM AND TAKE 3 CAPSULES AT 10:00PM  . isosorbide mononitrate (IMDUR) 30 MG 24 hr tablet Take 1 tablet (30 mg total) by mouth daily.  Marland Kitchen ketoconazole (NIZORAL) 2 % shampoo APPLY TOPICALLY DAILY AS  DIRECTED (Patient taking differently: Apply 1 application topically 3 (three) times a week. )  . lisinopril (PRINIVIL,ZESTRIL) 10 MG tablet TAKE 1 TABLET BY MOUTH  DAILY (Patient taking differently: Take 10 mg by mouth daily. )  . loperamide (IMODIUM) 2 MG capsule  Take 1 capsule (2 mg total) by mouth as needed for diarrhea or loose stools.  . meclizine (ANTIVERT) 25 MG tablet Take 1 tablet (25 mg total) by mouth 3 (three) times daily as needed for dizziness.  . meloxicam (MOBIC) 7.5 MG tablet TAKE 1 TABLET BY MOUTH  DAILY (Patient taking differently: Take 7.5 mg by mouth daily. )  . metFORMIN (GLUCOPHAGE-XR) 500 MG 24 hr tablet TAKE 2 TABLETS BY MOUTH  DAILY WITH BREAKFAST (Patient taking differently: Take 500 mg by mouth 2 (two) times daily. )  . metoprolol (LOPRESSOR) 50 MG tablet Take 1 tablet (50 mg total) by mouth once as directed.  . montelukast (SINGULAIR)  10 MG tablet TAKE 1 TABLET BY MOUTH AT  BEDTIME (Patient taking differently: Take 10 mg by mouth at bedtime. )  . Multiple Vitamin (MULTIVITAMIN WITH MINERALS) TABS tablet Take 1 tablet by mouth daily.  . nitroGLYCERIN (NITROSTAT) 0.4 MG SL tablet Place 1 tablet (0.4 mg total) under the tongue every 5 (five) minutes as needed for chest pain.  Glory Rosebush VERIO test strip USE WITH METER TO CHECK  BLOOD SUGAR 3 TIMES DAILY  . predniSONE (DELTASONE) 10 MG tablet 4 tabs by mouth once daily for 2 days, then 3 tabs daily x 2 days, then 2 tabs daily x 2 days, then 1 tab daily x 2 days  . PROAIR HFA 108 (90 Base) MCG/ACT inhaler Inhale 2 puffs into the lungs every 6 (six) hours as needed for wheezing or shortness of breath.  . sertraline (ZOLOFT) 100 MG tablet TAKE 1 TABLET BY MOUTH  DAILY (Patient taking differently: Take 100 mg by mouth daily. )  . tamsulosin (FLOMAX) 0.4 MG CAPS capsule TAKE 1 CAPSULE BY MOUTH  DAILY (Patient taking differently: Take 0.4 mg by mouth daily. )  . tiZANidine (ZANAFLEX) 2 MG tablet TAKE 1 TABLET BY MOUTH  EVERY 6 HOURS AS NEEDED FOR MUSCLE SPASM(S) (Patient taking differently: Take 2 mg by mouth every 6 (six) hours as needed for muscle spasms. )  . traMADol (ULTRAM) 50 MG tablet TAKE 1 TABLET BY MOUTH  EVERY 6 HOURS AS NEEDED FOR SEVERE PAIN (Patient taking differently: Take 50 mg by mouth every 6 (six) hours as needed for severe pain. TAKE 1 TABLET BY MOUTH  EVERY 6 HOURS AS NEEDED FOR SEVERE PAIN)  . traZODone (DESYREL) 100 MG tablet TAKE 1 TABLET BY MOUTH AT  BEDTIME (Patient taking differently: Take 100 mg by mouth at bedtime. )     Allergies  Allergen Reactions  . Benadryl [Diphenhydramine Hcl]     "drives me nuts"    Social History   Socioeconomic History  . Marital status: Married    Spouse name: Not on file  . Number of children: Not on file  . Years of education: Not on file  . Highest education level: Not on file  Occupational History  . Not on file    Social Needs  . Financial resource strain: Not on file  . Food insecurity:    Worry: Not on file    Inability: Not on file  . Transportation needs:    Medical: Not on file    Non-medical: Not on file  Tobacco Use  . Smoking status: Never Smoker  . Smokeless tobacco: Never Used  Substance and Sexual Activity  . Alcohol use: No  . Drug use: No  . Sexual activity: Not on file  Lifestyle  . Physical activity:    Days per week: Not on  file    Minutes per session: Not on file  . Stress: Not on file  Relationships  . Social connections:    Talks on phone: Not on file    Gets together: Not on file    Attends religious service: Not on file    Active member of club or organization: Not on file    Attends meetings of clubs or organizations: Not on file    Relationship status: Not on file  . Intimate partner violence:    Fear of current or ex partner: Not on file    Emotionally abused: Not on file    Physically abused: Not on file    Forced sexual activity: Not on file  Other Topics Concern  . Not on file  Social History Narrative   Holter monitor 08/2010: PVCs and sinus tachy.   Sleep Study (02/2008): mild sleep apnea, no indication for CPAP.     Review of Systems: General: negative for chills, fever, night sweats or weight changes.  Cardiovascular: negative for chest pain, dyspnea on exertion, edema, orthopnea, palpitations, paroxysmal nocturnal dyspnea or shortness of breath Dermatological: negative for rash Respiratory: negative for cough or wheezing Urologic: negative for hematuria Abdominal: negative for nausea, vomiting, diarrhea, bright red blood per rectum, melena, or hematemesis Neurologic: negative for visual changes, syncope, or dizziness All other systems reviewed and are otherwise negative except as noted above.    Blood pressure 108/76, pulse 85, height 5\' 8"  (1.727 m), weight 196 lb 12.8 oz (89.3 kg).  General appearance: alert and no distress Neck: no  adenopathy, no carotid bruit, no JVD, supple, symmetrical, trachea midline and thyroid not enlarged, symmetric, no tenderness/mass/nodules Lungs: clear to auscultation bilaterally Heart: regular rate and rhythm, S1, S2 normal, no murmur, click, rub or gallop Extremities: extremities normal, atraumatic, no cyanosis or edema Pulses: 2+ and symmetric Skin: Skin color, texture, turgor normal. No rashes or lesions Neurologic: Alert and oriented X 3, normal strength and tone. Normal symmetric reflexes. 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  He did have a Myoview stress test in 2016 which was low risk as well  Essential hypertension History of essential hypertension with blood pressure measured today 108/76.  He is on lisinopril and metoprolol.  Continue current meds at current dosing.  Hyperlipidemia History of hyperlipidemia on statin therapy, Zetia and fenofibrate with lipid profile performed 07/31/2018 revealing total cholesterol 195, LDL 119 and triglyceride level of 347.  I have referred him to Dr. Debara Pickett for further evaluation and treatment.  Thoracic aortic aneurysm (HCC) History of small thoracic aortic aneurysm measuring 3.1 cm by CTA 05/29/2018.      Lorretta Harp MD FACP,FACC,FAHA, [facility] 10/23/2018 12:19 PM

## 2018-10-23 NOTE — Assessment & Plan Note (Signed)
History of small thoracic aortic aneurysm measuring 3.1 cm by CTA 05/29/2018.

## 2018-10-23 NOTE — Assessment & Plan Note (Signed)
History of cardiac cath in 2011 with nonobstructive CAD in the circumflex and RCA with a tortuous LAD.  He did have a Myoview stress test in 2016 which was low risk as well

## 2018-10-23 NOTE — Patient Instructions (Addendum)
Please arrive at the [facility] main entrance of [facility] at xx:xx AM (30-45 minutes prior to test start time)  [facility] [place], [place] [address] [phone]  Proceed to the [facility] Radiology Department (First Floor).  Please follow these instructions carefully (unless otherwise directed):  Hold all erectile dysfunction medications at least 48 hours prior to test.  On the Night Before the Test: . Be sure to Drink plenty of water. . Do not consume any caffeinated/decaffeinated beverages or chocolate 12 hours prior to your test. . Do not take any antihistamines 12 hours prior to your test. . If you take Metformin do not take 24 hours prior to test.  On the Day of the Test: . Drink plenty of water. Do not drink any water within one hour of the test. . Do not eat any food 4 hours prior to the test. . You may take your regular medications prior to the test.  . Take ONE TABLET of metoprolol (Lopressor) 100 MG two hours prior to test. DO NOT TAKE YOUR METOPROLOL 50MG  ON THE DAY OF THE CORONARY CTA . HOLD Furosemide/Hydrochlorothiazide morning of the test.       After the Test: . Drink plenty of water. . After receiving IV contrast, you may experience a mild flushed feeling. This is normal. . On occasion, you may experience a mild rash up to 24 hours after the test. This is not dangerous. If this occurs, you can take Benadryl 25 mg and increase your fluid intake. . If you experience trouble breathing, this can be serious. If it is severe call 911 IMMEDIATELY. If it is mild, please call our office. . If you take any of these medications: Glipizide/Metformin, Avandament, Glucavance, please do not take 48 hours after completing test.   Testing/Procedures: Your physician has requested that you have an echocardiogram. Echocardiography is a painless test that uses sound waves to create images of your heart. It provides your doctor with  information about the size and shape of your heart and how well your heart's chambers and valves are working. This procedure takes approximately one hour. There are no restrictions for this procedure.   Follow-Up: At [facility], you and your health needs are our priority.  As part of our continuing mission to provide you with exceptional heart care, we have created designated Provider Care Teams.  These Care Teams include your primary Cardiologist (physician) and Advanced Practice Providers (APPs -  Physician Assistants and Nurse Practitioners) who all work together to provide you with the care you need, when you need it. . You will need a follow up appointment in 6 months with an APP and 12 months with Dr. Gwenlyn Found.  Please call our office 2 months in advance to schedule this appointment.  You may see Dr. Gwenlyn Found or one of the following Advanced Practice Providers on your designated Care Team:   . Kerin Ransom, [place] . Almyra Deforest, PA-C . Fabian Sharp, PA-C . Jory Sims, DNP . Rosaria Ferries, PA-C . Roby Lofts, PA-C . Sande Rives, PA-C  Any Other Special Instructions Will Be Listed Below (If Applicable). REFERRAL TO DR. HILTY IN LIPID DISORDERS CLINIC

## 2018-10-23 NOTE — Addendum Note (Signed)
Addended by: Annita Brod on: 10/23/2018 12:50 PM   Modules accepted: Orders

## 2018-10-23 NOTE — Assessment & Plan Note (Signed)
History of hyperlipidemia on statin therapy, Zetia and fenofibrate with lipid profile performed 07/31/2018 revealing total cholesterol 195, LDL 119 and triglyceride level of 347.  I have referred him to Dr. Debara Pickett for further evaluation and treatment.

## 2018-10-26 ENCOUNTER — Emergency Department (HOSPITAL_COMMUNITY): Payer: Medicare Other

## 2018-10-26 ENCOUNTER — Encounter (HOSPITAL_COMMUNITY): Payer: Self-pay | Admitting: Emergency Medicine

## 2018-10-26 ENCOUNTER — Other Ambulatory Visit: Payer: Self-pay

## 2018-10-26 ENCOUNTER — Emergency Department (HOSPITAL_COMMUNITY)
Admission: EM | Admit: 2018-10-26 | Discharge: 2018-10-27 | Disposition: A | Discharge status: Home / Self Care | Payer: Medicare Other | Attending: Emergency Medicine | Admitting: Emergency Medicine

## 2018-10-26 DIAGNOSIS — R079 Chest pain, unspecified: Secondary | ICD-10-CM | POA: Insufficient documentation

## 2018-10-26 DIAGNOSIS — S301XXA Contusion of abdominal wall, initial encounter: Secondary | ICD-10-CM

## 2018-10-26 DIAGNOSIS — S0990XA Unspecified injury of head, initial encounter: Secondary | ICD-10-CM | POA: Insufficient documentation

## 2018-10-26 DIAGNOSIS — Y939 Activity, unspecified: Secondary | ICD-10-CM | POA: Insufficient documentation

## 2018-10-26 DIAGNOSIS — M791 Myalgia, unspecified site: Secondary | ICD-10-CM | POA: Diagnosis not present

## 2018-10-26 DIAGNOSIS — Y92410 Unspecified street and highway as the place of occurrence of the external cause: Secondary | ICD-10-CM | POA: Insufficient documentation

## 2018-10-26 DIAGNOSIS — E119 Type 2 diabetes mellitus without complications: Secondary | ICD-10-CM | POA: Insufficient documentation

## 2018-10-26 DIAGNOSIS — Y998 Other external cause status: Secondary | ICD-10-CM | POA: Diagnosis not present

## 2018-10-26 DIAGNOSIS — Z7984 Long term (current) use of oral hypoglycemic drugs: Secondary | ICD-10-CM | POA: Insufficient documentation

## 2018-10-26 DIAGNOSIS — Z79899 Other long term (current) drug therapy: Secondary | ICD-10-CM | POA: Diagnosis not present

## 2018-10-26 DIAGNOSIS — M7918 Myalgia, other site: Secondary | ICD-10-CM

## 2018-10-26 HISTORY — DX: Type 2 diabetes mellitus without complications: E11.9

## 2018-10-26 LAB — COMPREHENSIVE METABOLIC PANEL
ALT: 22 U/L (ref 0–44)
ANION GAP: 15 (ref 5–15)
AST: 25 U/L (ref 15–41)
Albumin: 4.1 g/dL (ref 3.5–5.0)
Alkaline Phosphatase: 81 U/L (ref 38–126)
BUN: 17 mg/dL (ref 6–20)
CO2: 22 mmol/L (ref 22–32)
Calcium: 9.5 mg/dL (ref 8.9–10.3)
Chloride: 100 mmol/L (ref 98–111)
Creatinine, Ser: 0.94 mg/dL (ref 0.61–1.24)
GFR calc non Af Amer: >60 mL/min (ref >60)
Glucose, Bld: 160 mg/dL — ABNORMAL HIGH (ref 70–99)
POTASSIUM: 4.1 mmol/L (ref 3.5–5.1)
Sodium: 137 mmol/L (ref 135–145)
Total Bilirubin: 0.6 mg/dL (ref 0.3–1.2)
Total Protein: 7.5 g/dL (ref 6.5–8.1)

## 2018-10-26 LAB — PROTIME-INR
INR: 0.86
Prothrombin Time: 11.7 seconds (ref 11.4–15.2)

## 2018-10-26 LAB — URINALYSIS, ROUTINE W REFLEX MICROSCOPIC
Bilirubin Urine: NEGATIVE
Glucose, UA: NEGATIVE mg/dL
Hgb urine dipstick: NEGATIVE
Ketones, ur: NEGATIVE mg/dL
Leukocytes, UA: NEGATIVE
NITRITE: NEGATIVE
Protein, ur: NEGATIVE mg/dL
Specific Gravity, Urine: 1.045 — ABNORMAL HIGH (ref 1.005–1.030)
pH: 7 (ref 5.0–8.0)

## 2018-10-26 LAB — CBC
HCT: 46.3 % (ref 39.0–52.0)
HEMOGLOBIN: 14.9 g/dL (ref 13.0–17.0)
MCH: 26.8 pg (ref 26.0–34.0)
MCHC: 32.2 g/dL (ref 30.0–36.0)
MCV: 83.1 fL (ref 80.0–100.0)
Platelets: 291 10*3/uL (ref 150–400)
RBC: 5.57 MIL/uL (ref 4.22–5.81)
RDW: 15.9 % — ABNORMAL HIGH (ref 11.5–15.5)
WBC: 10.2 10*3/uL (ref 4.0–10.5)
nRBC: 0 % (ref 0.0–0.2)

## 2018-10-26 LAB — ETHANOL: Alcohol, Ethyl (B): <10 mg/dL (ref <10)

## 2018-10-26 LAB — CDS SEROLOGY

## 2018-10-26 LAB — LACTIC ACID, PLASMA: LACTIC ACID, VENOUS: 2.6 mmol/L — AB (ref 0.5–1.9)

## 2018-10-26 MED ORDER — KETOROLAC TROMETHAMINE 15 MG/ML IJ SOLN
15.0000 mg | Freq: Once | INTRAMUSCULAR | Status: AC
  Administered 2018-10-26: 15 mg via INTRAVENOUS
  Filled 2018-10-26: qty 1

## 2018-10-26 MED ORDER — SODIUM CHLORIDE 0.9 % IV BOLUS
1000.0000 mL | Freq: Once | INTRAVENOUS | Status: AC
  Administered 2018-10-26: 1000 mL via INTRAVENOUS

## 2018-10-26 MED ORDER — NITROGLYCERIN 0.4 MG SL SUBL: 0.4000 mg | SUBLINGUAL_TABLET | Freq: Once | SUBLINGUAL | Status: DC

## 2018-10-26 MED ORDER — IOHEXOL 300 MG/ML  SOLN
100.0000 mL | Freq: Once | INTRAMUSCULAR | Status: AC | PRN
  Administered 2018-10-26: 100 mL via INTRAVENOUS
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
## 2018-10-27 DIAGNOSIS — S0990XA Unspecified injury of head, initial encounter: Secondary | ICD-10-CM | POA: Diagnosis not present

## 2018-10-27 LAB — I-STAT TROPONIN, ED: Troponin i, poc: 0.01 ng/mL (ref 0.00–0.08)

## 2018-10-27 MED ORDER — NITROGLYCERIN 0.4 MG SL SUBL
0.4000 mg | SUBLINGUAL_TABLET | Freq: Once | SUBLINGUAL | Status: AC | PRN
  Administered 2018-10-27: 0.4 mg via SUBLINGUAL
  Filled 2018-10-27: qty 1

## 2018-10-27 NOTE — Progress Notes (Signed)
   10/27/18 0500  Clinical Encounter Type  Visited With Patient  Visit Type ED  Patient was alert on arrival and family was present. Provided spiritual care and support.

## 2018-10-27 NOTE — ED Notes (Signed)
Patient verbalizes understanding of discharge instructions. Opportunity for questioning and answers were provided. Armband removed by staff, pt discharged from ED ambulatory.   

## 2018-10-29 ENCOUNTER — Ambulatory Visit (INDEPENDENT_AMBULATORY_CARE_PROVIDER_SITE_OTHER): Payer: Medicare Other | Admitting: Family

## 2018-10-29 ENCOUNTER — Encounter: Payer: Self-pay | Admitting: Cardiovascular Disease

## 2018-10-29 ENCOUNTER — Encounter: Payer: Self-pay | Admitting: Family

## 2018-10-29 VITALS — BP 112/68 | HR 96 | Temp 98.1°F | Resp 16 | Wt 196.6 lb

## 2018-10-29 DIAGNOSIS — M7918 Myalgia, other site: Secondary | ICD-10-CM | POA: Diagnosis not present
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
## 2018-10-29 NOTE — Patient Instructions (Signed)
You may use tylenol as well as tramadol as needed for pain. Call if pain worsens or if not improved in 2 weeks.

## 2018-10-31 ENCOUNTER — Other Ambulatory Visit (HOSPITAL_COMMUNITY): Payer: Self-pay

## 2018-11-25 ENCOUNTER — Other Ambulatory Visit: Payer: Self-pay | Admitting: Family

## 2018-11-26 ENCOUNTER — Ambulatory Visit (INDEPENDENT_AMBULATORY_CARE_PROVIDER_SITE_OTHER): Payer: Medicare Other | Admitting: Medical

## 2018-11-26 ENCOUNTER — Ambulatory Visit (HOSPITAL_BASED_OUTPATIENT_CLINIC_OR_DEPARTMENT_OTHER)
Admission: RE | Admit: 2018-11-26 | Discharge: 2018-11-26 | Disposition: A | Discharge status: Home / Self Care | Payer: Medicare Other | Source: Ambulatory Visit | Attending: Medical | Admitting: Medical

## 2018-11-26 ENCOUNTER — Encounter: Payer: Self-pay | Admitting: Medical

## 2018-11-26 VITALS — BP 112/70 | HR 93 | Temp 98.2°F | Resp 18 | Ht 68.0 in | Wt 198.0 lb

## 2018-11-26 DIAGNOSIS — R05 Cough: Secondary | ICD-10-CM | POA: Insufficient documentation

## 2018-11-26 DIAGNOSIS — R062 Wheezing: Secondary | ICD-10-CM

## 2018-11-26 DIAGNOSIS — R059 Cough, unspecified: Secondary | ICD-10-CM

## 2018-11-26 DIAGNOSIS — J40 Bronchitis, not specified as acute or chronic: Secondary | ICD-10-CM

## 2018-11-26 DIAGNOSIS — J0100 Acute maxillary sinusitis, unspecified: Secondary | ICD-10-CM

## 2018-11-26 MED ORDER — DOXYCYCLINE HYCLATE 100 MG PO TABS: 100.0000 mg | ORAL_TABLET | Freq: Two times a day (BID) | ORAL | 0 refills | Status: DC

## 2018-11-26 MED ORDER — HYDROCODONE-HOMATROPINE 5-1.5 MG/5ML PO SYRP: 5.0000 mL | ORAL_SOLUTION | Freq: Four times a day (QID) | ORAL | 0 refills | Status: DC | PRN

## 2018-11-26 MED ORDER — PREDNISONE 10 MG PO TABS: ORAL_TABLET | ORAL | 0 refills | Status: DC

## 2018-11-26 NOTE — Patient Instructions (Addendum)
You appear to have bronchitis and sinusitis. Rest hydrate and tylenol for fever. I am prescribing cough medicine hycodan, and doxycycline antibiotic. For your nasal congestion you could use otc flonase.    For history of asthma with recent flare of wheezing, continue with your albuterol treatment.  But since lung fields are very wheezy throughout and you are using the albuterol every 6 hours, I do think that prednisone taper treatment is required at this time.  While on taper prednisone keep checking your sugars daily.  If sugars are exceeding 200 then add additional metformin tablet.  But if this is not adequate to control sugars then let me know and I could look for sample pen mealtime insulin and give you instruction on sliding scale use.  Please get chest x-ray today to evaluate if you have pneumonia.  Follow up in 7-10 days or as needed

## 2018-11-26 NOTE — Progress Notes (Signed)
Subjective:    Patient ID: Cody Raymond, male    DOB: [birth year], 48 y.o.   MRN: [id number]  HPI  Pt in states last Thursday got nasal congestion. He feels like gradually getting worse. Nasal congestion worse. Some sinus pressure and getting some productive cough. Some blood tinged mucus now. Pt state using he nebulizer and inhaler about every 6 hours.  No recent traveling other than local travel.    Review of Systems  Constitutional: Negative for chills, fatigue and fever.  HENT: Positive for congestion, sinus pressure and sinus pain.   Respiratory: Positive for cough and wheezing. Negative for chest tightness and shortness of breath.   Cardiovascular: Negative for chest pain and palpitations.  Gastrointestinal: Negative for abdominal pain.  Musculoskeletal: Negative for back pain, myalgias and neck pain.  Neurological: Negative for dizziness, speech difficulty and light-headedness.  Hematological: Negative for adenopathy. Does not bruise/bleed easily.  Psychiatric/Behavioral: Negative for behavioral problems and confusion. The patient is not nervous/anxious.     Past Medical History:  Diagnosis Date  . Aneurysm ([place])   . Asthma   . Blood transfusion   . CAD (coronary artery disease), non obstructive on cath 2011 04/05/2012  . Coronary artery disease   . Diabetes mellitus   . Diabetes mellitus without complication ([place])   . Enlarged prostate   . GERD (gastroesophageal reflux disease)   . H/O syncope   . Heart disease   . History of repair of patent ductus arteriosus 07/23/2016  . Hyperlipidemia   . Hypertension   . 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 Refusal of blood transfusions as patient is Jehovah's Witness      Social History   Socioeconomic History  . Marital status: Married    Spouse name: Not on file  . Number of children: Not on file  . Years of education: Not on file  . Highest education level: Not on file  Occupational History  . Not on file    Social Needs  . Financial resource strain: Not on file  . Food insecurity:    Worry: Not on file    Inability: Not on file  . Transportation needs:    Medical: Not on file    Non-medical: Not on file  Tobacco Use  . Smoking status: Never Smoker  . Smokeless tobacco: Never Used  Substance and Sexual Activity  . Alcohol use: No  . Drug use: No  . Sexual activity: Yes  Lifestyle  . Physical activity:    Days per week: Not on file    Minutes per session: Not on file  . Stress: Not on file  Relationships  . Social connections:    Talks on phone: Not on file    Gets together: Not on file    Attends religious service: Not on file    Active member of club or organization: Not on file    Attends meetings of clubs or organizations: Not on file    Relationship status: Not on file  . Intimate partner violence:    Fear of current or ex partner: Not on file    Emotionally abused: Not on file    Physically abused: Not on file    Forced sexual activity: Not on file  Other Topics Concern  . Not on file  Social History Narrative   ** Merged History Encounter **       Holter monitor 08/2010: PVCs and sinus tachy.   Sleep Study (02/2008): mild sleep  apnea, no indication for CPAP.    Past Surgical History:  Procedure Laterality Date  . APPENDECTOMY    . BACK SURGERY    . CARDIAC CATHETERIZATION  2007   Clean Cardiac Cath ([place]), with RCA 30% narrowing likely due to catheter induced spasm in 09/02/10.  Marland Kitchen CARDIAC CATHETERIZATION  09/02/2010   mod. nonobstructive disease in the RCA and CX, tortuous LAD  . COLONOSCOPY W/ POLYPECTOMY  03/17/11   diminutive polyp  . LOOP RECORDER EXPLANT N/A 01/14/2014   Procedure: LOOP RECORDER EXPLANT;  Surgeon: Sanda Klein, MD;  Location: [facility];  Service: Cardiovascular;  Laterality: N/A;  . LOOP RECORDER IMPLANT  04/06/2012   Reveal XT 2831  . LOOP RECORDER IMPLANT N/A 04/06/2012   Procedure: LOOP RECORDER IMPLANT;  Surgeon:  Sanda Klein, MD;  Location: [facility];  Service: Cardiovascular;  Laterality: N/A;  . NM MYOCAR PERF WALL MOTION  01/25/2008   mild anteroapical wall ischemia  . PATENT DUCTUS ARTERIOUS REPAIR     at age 71    Family History  Problem Relation Age of Onset  . Colon cancer Paternal Grandfather   . Prostate cancer Paternal Grandfather   . Aneurysm Father   . Heart attack Father 65  . Coronary artery disease Father   . Colon polyps Mother   . Heart disease Mother   . Coronary artery disease Mother   . Migraines Mother   . Breast cancer Maternal Grandmother   . Colon cancer Paternal Uncle        x 2  . Stomach cancer Brother   . Liver disease Unknown        unsure who it was  . Allergies Daughter     Allergies  Allergen Reactions  . Benadryl [Diphenhydramine Hcl]     "drives me nuts"    Current Outpatient Medications on File Prior to Visit  Medication Sig Dispense Refill  . albuterol (PROVENTIL) (2.5 MG/3ML) 0.083% nebulizer solution USE 1 VIAL VIA NEBULIZER  EVERY 6 HOURS AS NEEDED FOR WHEEZING OR SHORTNESS OF  BREATH (Patient taking differently: Take 2.5 mg by nebulization every 6 (six) hours as needed for wheezing or shortness of breath. ) 1125 mL 1  . aspirin 81 MG tablet Take 81 mg by mouth daily.     Marland Kitchen atorvastatin (LIPITOR) 80 MG tablet TAKE 1 TABLET BY MOUTH  DAILY (Patient taking differently: Take 80 mg by mouth daily. ) 90 tablet 1  . atorvastatin (LIPITOR) 80 MG tablet Take 80 mg by mouth daily at 6 PM.     . cetirizine (ZYRTEC) 10 MG tablet Take 10 mg by mouth daily.    . clonazePAM (KLONOPIN) 1 MG tablet TAKE 1 TABLET BY MOUTH  DAILY (Patient taking differently: Take 1 mg by mouth daily. ) 90 tablet 0  . diclofenac sodium (VOLTAREN) 1 % GEL Apply 2 g topically 4 (four) times daily as needed (pain). 720 g 0  . DULoxetine (CYMBALTA) 30 MG capsule Take 30 mg by mouth daily.    Marland Kitchen ezetimibe (ZETIA) 10 MG tablet TAKE 1 TABLET BY MOUTH  DAILY (Patient taking differently:  Take 10 mg by mouth daily. ) 90 tablet 1  . fenofibrate (TRICOR) 145 MG tablet Take 1 tablet by mouth  daily 90 tablet 1  . fluticasone (FLONASE) 50 MCG/ACT nasal spray Place 2 sprays into both nostrils as needed for allergies or rhinitis.    . Fluticasone-Salmeterol (WIXELA INHUB) 250-50 MCG/DOSE AEPB Inhale 1 puff into  the lungs 2 (two) times daily. 60 each 5  . gabapentin (NEURONTIN) 300 MG capsule TAKE 1 CAPSULE BY MOUTH AT  6AM, 11:30AM AND 4:30PM AND TAKE 3 CAPSULES AT 10:00PM 540 capsule 1  . isosorbide mononitrate (IMDUR) 30 MG 24 hr tablet Take 1 tablet (30 mg total) by mouth daily. 30 tablet 0  . ketoconazole (NIZORAL) 2 % shampoo APPLY TOPICALLY DAILY AS  DIRECTED (Patient taking differently: Apply 1 application topically 3 (three) times a week. ) 360 mL 1  . lisinopril (PRINIVIL,ZESTRIL) 10 MG tablet TAKE 1 TABLET BY MOUTH  DAILY (Patient taking differently: Take 10 mg by mouth daily. ) 90 tablet 1  . loperamide (IMODIUM) 2 MG capsule Take 1 capsule (2 mg total) by mouth as needed for diarrhea or loose stools. 12 capsule 0  . meclizine (ANTIVERT) 25 MG tablet Take 1 tablet (25 mg total) by mouth 3 (three) times daily as needed for dizziness. 30 tablet 0  . meloxicam (MOBIC) 7.5 MG tablet TAKE 1 TABLET BY MOUTH  DAILY (Patient taking differently: Take 7.5 mg by mouth daily. ) 90 tablet 0  . metFORMIN (GLUCOPHAGE-XR) 500 MG 24 hr tablet TAKE 2 TABLETS BY MOUTH  DAILY WITH BREAKFAST (Patient taking differently: Take 500 mg by mouth 2 (two) times daily. ) 180 tablet 1  . metFORMIN (GLUCOPHAGE-XR) 500 MG 24 hr tablet Take 1,000 mg by mouth daily.    . metoprolol (LOPRESSOR) 50 MG tablet Take 1 tablet (50 mg total) by mouth once as directed. 1 tablet 0  . montelukast (SINGULAIR) 10 MG tablet TAKE 1 TABLET BY MOUTH AT  BEDTIME (Patient taking differently: Take 10 mg by mouth at bedtime. ) 90 tablet 1  . montelukast (SINGULAIR) 10 MG tablet Take 10 mg by mouth at bedtime.    . Multiple Vitamin  (MULTIVITAMIN WITH MINERALS) TABS tablet Take 1 tablet by mouth daily.    . nitroGLYCERIN (NITROSTAT) 0.4 MG SL tablet Place 1 tablet (0.4 mg total) under the tongue every 5 (five) minutes as needed for chest pain. 30 tablet 1  . ONETOUCH VERIO test strip USE WITH METER TO CHECK  BLOOD SUGAR 3 TIMES DAILY 300 each 3  . predniSONE (DELTASONE) 10 MG tablet 4 tabs by mouth once daily for 2 days, then 3 tabs daily x 2 days, then 2 tabs daily x 2 days, then 1 tab daily x 2 days 20 tablet 0  . PROAIR HFA 108 (90 Base) MCG/ACT inhaler Inhale 2 puffs into the lungs every 6 (six) hours as needed for wheezing or shortness of breath. 3 Inhaler 1  . sertraline (ZOLOFT) 100 MG tablet TAKE 1 TABLET BY MOUTH  DAILY (Patient taking differently: Take 100 mg by mouth daily. ) 90 tablet 1  . tamsulosin (FLOMAX) 0.4 MG CAPS capsule TAKE 1 CAPSULE BY MOUTH  DAILY (Patient taking differently: Take 0.4 mg by mouth daily. ) 45 capsule 1  . tiZANidine (ZANAFLEX) 2 MG tablet TAKE 1 TABLET BY MOUTH  EVERY 6 HOURS AS NEEDED FOR MUSCLE SPASM(S) (Patient taking differently: Take 2 mg by mouth every 6 (six) hours as needed for muscle spasms. ) 270 tablet 1  . traMADol (ULTRAM) 50 MG tablet TAKE 1 TABLET BY MOUTH  EVERY 6 HOURS AS NEEDED FOR SEVERE PAIN (Patient taking differently: Take 50 mg by mouth every 6 (six) hours as needed for severe pain. TAKE 1 TABLET BY MOUTH  EVERY 6 HOURS AS NEEDED FOR SEVERE PAIN) 120 tablet 4  .  traZODone (DESYREL) 100 MG tablet Take 100 mg by mouth at bedtime.    . metoprolol tartrate (LOPRESSOR) 100 MG tablet Take 1 tablet (100 mg total) by mouth once for 1 dose. TAKE 2 HOURS BEFORE YOUR PROCEDURE 1 tablet 0  . [DISCONTINUED] niacin (NIASPAN) 1000 MG CR tablet Take 1 tablet (1,000 mg total) by mouth at bedtime. 30 tablet 6   No current facility-administered medications on file prior to visit.     BP 112/70 (BP Location: Right Arm, Patient Position: Sitting, Cuff Size: Large)   Pulse 93   Temp  98.2 F (36.8 C) (Oral)   Resp 18   Ht 5\' 8"  (1.727 m)   Wt 198 lb (89.8 kg)   SpO2 97%   BMI 30.11 kg/m       Objective:   Physical Exam  General  Mental Status - Alert. General Appearance - Well groomed. Not in acute distress.  Skin Rashes- No Rashes.  HEENT Head- Normal. Ear Auditory Canal - Left- Normal. Right - Normal.Tympanic Membrane- Left- Normal. Right- Normal. Eye Sclera/Conjunctiva- Left- Normal. Right- Normal. Nose & Sinuses Nasal Mucosa- Left-  Boggy and Congested. Right-  Boggy and  Congested.Bilateral maxillary and frontal sinus pressure. Mouth & Throat Lips: Upper Lip- Normal: no dryness, cracking, pallor, cyanosis, or vesicular eruption. Lower Lip-Normal: no dryness, cracking, pallor, cyanosis or vesicular eruption. Buccal Mucosa- Bilateral- No Aphthous ulcers. Oropharynx- No Discharge or Erythema. Tonsils: Characteristics- Bilateral- No Erythema or Congestion. Size/Enlargement- Bilateral- No enlargement. Discharge- bilateral-None.  Neck Neck- Supple. No Masses.   Chest and Lung Exam Auscultation: Breath Sounds:- even and unlabored. Scattered wheezing throughout.  Cardiovascular Auscultation:Rythm- Regular, rate and rhythm. Murmurs & Other Heart Sounds:Ausculatation of the heart reveal- No Murmurs.  Lymphatic Head & Neck General Head & Neck Lymphatics: Bilateral: Description- No Localized lymphadenopathy.       Assessment & Plan:   You appear to have bronchitis and sinusitis. Rest hydrate and tylenol for fever. I am prescribing cough medicine hycodan, and doxycycline antibiotic. For your nasal congestion you could use otc flonase.    For history of asthma with recent flare of wheezing, continue with your albuterol treatment.  But since lung fields are very wheezy throughout and you are using the albuterol every 6 hours, I do think that prednisone taper treatment is required at this time.  While on taper prednisone keep checking your sugars  daily.  If sugars are exceeding 200 then add additional metformin tablet.  But if this is not adequate to control sugars then  let me know and I could look for sample pen mealtime insulin and give you instruction on dosing.  Please get chest x-ray today to evaluate if you have pneumonia.  Follow up in 7-10 days or as needed  [employer], [employer]

## 2018-12-12 ENCOUNTER — Ambulatory Visit (HOSPITAL_COMMUNITY): Payer: Medicare Other

## 2018-12-14 ENCOUNTER — Emergency Department (HOSPITAL_COMMUNITY): Payer: Medicare Other

## 2018-12-14 ENCOUNTER — Encounter (HOSPITAL_COMMUNITY): Payer: Self-pay | Admitting: Emergency Medicine

## 2018-12-14 ENCOUNTER — Observation Stay (HOSPITAL_COMMUNITY)
Admission: EM | Admit: 2018-12-14 | Discharge: 2018-12-15 | Disposition: A | Discharge status: Home / Self Care | Payer: Medicare Other | Attending: Internal Medicine | Admitting: Internal Medicine

## 2018-12-14 ENCOUNTER — Other Ambulatory Visit: Payer: Self-pay

## 2018-12-14 ENCOUNTER — Telehealth: Payer: Self-pay | Admitting: Internal Medicine

## 2018-12-14 DIAGNOSIS — E119 Type 2 diabetes mellitus without complications: Secondary | ICD-10-CM | POA: Insufficient documentation

## 2018-12-14 DIAGNOSIS — N400 Enlarged prostate without lower urinary tract symptoms: Secondary | ICD-10-CM | POA: Insufficient documentation

## 2018-12-14 DIAGNOSIS — G4700 Insomnia, unspecified: Secondary | ICD-10-CM | POA: Diagnosis not present

## 2018-12-14 DIAGNOSIS — F419 Anxiety disorder, unspecified: Secondary | ICD-10-CM

## 2018-12-14 DIAGNOSIS — Z8249 Family history of ischemic heart disease and other diseases of the circulatory system: Secondary | ICD-10-CM | POA: Insufficient documentation

## 2018-12-14 DIAGNOSIS — J45909 Unspecified asthma, uncomplicated: Secondary | ICD-10-CM | POA: Diagnosis not present

## 2018-12-14 DIAGNOSIS — Z791 Long term (current) use of non-steroidal anti-inflammatories (NSAID): Secondary | ICD-10-CM | POA: Insufficient documentation

## 2018-12-14 DIAGNOSIS — I2510 Atherosclerotic heart disease of native coronary artery without angina pectoris: Secondary | ICD-10-CM | POA: Insufficient documentation

## 2018-12-14 DIAGNOSIS — Z794 Long term (current) use of insulin: Secondary | ICD-10-CM | POA: Diagnosis not present

## 2018-12-14 DIAGNOSIS — Z86718 Personal history of other venous thrombosis and embolism: Secondary | ICD-10-CM | POA: Insufficient documentation

## 2018-12-14 DIAGNOSIS — R002 Palpitations: Secondary | ICD-10-CM | POA: Insufficient documentation

## 2018-12-14 DIAGNOSIS — K219 Gastro-esophageal reflux disease without esophagitis: Secondary | ICD-10-CM | POA: Insufficient documentation

## 2018-12-14 DIAGNOSIS — I10 Essential (primary) hypertension: Secondary | ICD-10-CM | POA: Diagnosis not present

## 2018-12-14 DIAGNOSIS — F418 Other specified anxiety disorders: Secondary | ICD-10-CM | POA: Insufficient documentation

## 2018-12-14 DIAGNOSIS — E1142 Type 2 diabetes mellitus with diabetic polyneuropathy: Secondary | ICD-10-CM | POA: Diagnosis not present

## 2018-12-14 DIAGNOSIS — E785 Hyperlipidemia, unspecified: Secondary | ICD-10-CM | POA: Diagnosis not present

## 2018-12-14 DIAGNOSIS — Z7951 Long term (current) use of inhaled steroids: Secondary | ICD-10-CM | POA: Diagnosis not present

## 2018-12-14 DIAGNOSIS — R079 Chest pain, unspecified: Secondary | ICD-10-CM | POA: Diagnosis present

## 2018-12-14 DIAGNOSIS — R51 Headache: Secondary | ICD-10-CM | POA: Diagnosis not present

## 2018-12-14 DIAGNOSIS — J3489 Other specified disorders of nose and nasal sinuses: Secondary | ICD-10-CM | POA: Diagnosis not present

## 2018-12-14 DIAGNOSIS — Z79899 Other long term (current) drug therapy: Secondary | ICD-10-CM | POA: Diagnosis not present

## 2018-12-14 DIAGNOSIS — R000 Tachycardia, unspecified: Secondary | ICD-10-CM | POA: Insufficient documentation

## 2018-12-14 DIAGNOSIS — R072 Precordial pain: Secondary | ICD-10-CM

## 2018-12-14 DIAGNOSIS — R0789 Other chest pain: Secondary | ICD-10-CM | POA: Diagnosis not present

## 2018-12-14 DIAGNOSIS — Z7982 Long term (current) use of aspirin: Secondary | ICD-10-CM | POA: Diagnosis not present

## 2018-12-14 DIAGNOSIS — N529 Male erectile dysfunction, unspecified: Secondary | ICD-10-CM | POA: Insufficient documentation

## 2018-12-14 DIAGNOSIS — E669 Obesity, unspecified: Secondary | ICD-10-CM | POA: Insufficient documentation

## 2018-12-14 LAB — BASIC METABOLIC PANEL
Anion gap: 12 (ref 5–15)
BUN: 12 mg/dL (ref 6–20)
CO2: 20 mmol/L — ABNORMAL LOW (ref 22–32)
Calcium: 8.9 mg/dL (ref 8.9–10.3)
Chloride: 102 mmol/L (ref 98–111)
Creatinine, Ser: 0.76 mg/dL (ref 0.61–1.24)
GFR calc non Af Amer: >60 mL/min (ref >60)
Glucose, Bld: 131 mg/dL — ABNORMAL HIGH (ref 70–99)
Potassium: 3.6 mmol/L (ref 3.5–5.1)
Sodium: 134 mmol/L — ABNORMAL LOW (ref 135–145)

## 2018-12-14 LAB — CBC
HCT: 41.9 % (ref 39.0–52.0)
HEMATOCRIT: 44 % (ref 39.0–52.0)
HEMOGLOBIN: 14.5 g/dL (ref 13.0–17.0)
Hemoglobin: 13.2 g/dL (ref 13.0–17.0)
MCH: 26 pg (ref 26.0–34.0)
MCH: 26.6 pg (ref 26.0–34.0)
MCHC: 31.5 g/dL (ref 30.0–36.0)
MCHC: 33 g/dL (ref 30.0–36.0)
MCV: 82.5 fL (ref 80.0–100.0)
MCV: 80.7 fL (ref 80.0–100.0)
NRBC: 0 % (ref 0.0–0.2)
PLATELETS: 263 10*3/uL (ref 150–400)
Platelets: 311 10*3/uL (ref 150–400)
RBC: 5.08 MIL/uL (ref 4.22–5.81)
RBC: 5.45 MIL/uL (ref 4.22–5.81)
RDW: 15.6 % — ABNORMAL HIGH (ref 11.5–15.5)
RDW: 15.7 % — ABNORMAL HIGH (ref 11.5–15.5)
WBC: 10.7 10*3/uL — ABNORMAL HIGH (ref 4.0–10.5)
WBC: 11.1 10*3/uL — ABNORMAL HIGH (ref 4.0–10.5)
nRBC: 0 % (ref 0.0–0.2)

## 2018-12-14 LAB — TROPONIN I
Troponin I: <0.03 ng/mL (ref <0.03)
Troponin I: <0.03 ng/mL (ref <0.03)

## 2018-12-14 LAB — BRAIN NATRIURETIC PEPTIDE: B Natriuretic Peptide: 13.8 pg/mL (ref 0.0–100.0)

## 2018-12-14 LAB — GLUCOSE, CAPILLARY
Glucose-Capillary: 142 mg/dL — ABNORMAL HIGH (ref 70–99)
Glucose-Capillary: 152 mg/dL — ABNORMAL HIGH (ref 70–99)

## 2018-12-14 LAB — CREATININE, SERUM
Creatinine, Ser: 0.79 mg/dL (ref 0.61–1.24)
GFR calc non Af Amer: >60 mL/min (ref >60)

## 2018-12-14 LAB — TSH: TSH: 0.805 u[IU]/mL (ref 0.350–4.500)

## 2018-12-14 LAB — MRSA PCR SCREENING: MRSA by PCR: NEGATIVE

## 2018-12-14 MED ORDER — MECLIZINE HCL 25 MG PO TABS
25.0000 mg | ORAL_TABLET | Freq: Three times a day (TID) | ORAL | Status: DC | PRN
  Filled 2018-12-14: qty 1

## 2018-12-14 MED ORDER — MONTELUKAST SODIUM 10 MG PO TABS: 10.0000 mg | ORAL_TABLET | Freq: Every day | ORAL | Status: DC

## 2018-12-14 MED ORDER — ATORVASTATIN CALCIUM 80 MG PO TABS
80.0000 mg | ORAL_TABLET | Freq: Every day | ORAL | Status: DC
  Administered 2018-12-14 – 2018-12-15 (×2): 80 mg via ORAL
  Filled 2018-12-14 (×2): qty 1

## 2018-12-14 MED ORDER — TRAMADOL HCL 50 MG PO TABS: 50.0000 mg | ORAL_TABLET | Freq: Four times a day (QID) | ORAL | Status: DC | PRN

## 2018-12-14 MED ORDER — ALBUTEROL SULFATE (2.5 MG/3ML) 0.083% IN NEBU: 2.5000 mg | INHALATION_SOLUTION | Freq: Four times a day (QID) | RESPIRATORY_TRACT | Status: DC | PRN

## 2018-12-14 MED ORDER — FLUTICASONE PROPIONATE 50 MCG/ACT NA SUSP: 2.0000 | Freq: Every day | NASAL | Status: DC | PRN

## 2018-12-14 MED ORDER — ONDANSETRON HCL 4 MG PO TABS: 4.0000 mg | ORAL_TABLET | Freq: Four times a day (QID) | ORAL | Status: DC | PRN

## 2018-12-14 MED ORDER — TRAZODONE HCL 50 MG PO TABS
100.0000 mg | ORAL_TABLET | Freq: Every day | ORAL | Status: DC
  Administered 2018-12-14: 100 mg via ORAL
  Filled 2018-12-14: qty 2

## 2018-12-14 MED ORDER — METFORMIN HCL ER 500 MG PO TB24
1000.0000 mg | ORAL_TABLET | Freq: Every day | ORAL | Status: DC
  Administered 2018-12-15: 1000 mg via ORAL
  Filled 2018-12-14: qty 2

## 2018-12-14 MED ORDER — ADULT MULTIVITAMIN W/MINERALS CH
1.0000 | ORAL_TABLET | Freq: Every day | ORAL | Status: DC
  Administered 2018-12-15: 1 via ORAL
  Filled 2018-12-14 (×2): qty 1

## 2018-12-14 MED ORDER — TAMSULOSIN HCL 0.4 MG PO CAPS
0.4000 mg | ORAL_CAPSULE | Freq: Every day | ORAL | Status: DC
  Filled 2018-12-14 (×2): qty 1

## 2018-12-14 MED ORDER — ACETAMINOPHEN 325 MG PO TABS: 650.0000 mg | ORAL_TABLET | Freq: Four times a day (QID) | ORAL | Status: DC | PRN

## 2018-12-14 MED ORDER — ENOXAPARIN SODIUM 40 MG/0.4ML ~~LOC~~ SOLN
40.0000 mg | SUBCUTANEOUS | Status: DC
  Administered 2018-12-14: 40 mg via SUBCUTANEOUS
  Filled 2018-12-14: qty 0.4

## 2018-12-14 MED ORDER — METOPROLOL TARTRATE 25 MG PO TABS
25.0000 mg | ORAL_TABLET | Freq: Two times a day (BID) | ORAL | Status: DC
  Administered 2018-12-14 – 2018-12-15 (×2): 25 mg via ORAL
  Filled 2018-12-14 (×2): qty 1

## 2018-12-14 MED ORDER — MOMETASONE FURO-FORMOTEROL FUM 200-5 MCG/ACT IN AERO: 2.0000 | INHALATION_SPRAY | Freq: Two times a day (BID) | RESPIRATORY_TRACT | Status: DC

## 2018-12-14 MED ORDER — ACETAMINOPHEN 650 MG RE SUPP: 650.0000 mg | Freq: Four times a day (QID) | RECTAL | Status: DC | PRN

## 2018-12-14 MED ORDER — ONDANSETRON HCL 4 MG/2ML IJ SOLN: 4.0000 mg | Freq: Four times a day (QID) | INTRAMUSCULAR | Status: DC | PRN

## 2018-12-14 MED ORDER — LORATADINE 10 MG PO TABS
10.0000 mg | ORAL_TABLET | Freq: Every day | ORAL | Status: DC
  Administered 2018-12-14 – 2018-12-15 (×2): 10 mg via ORAL
  Filled 2018-12-14 (×2): qty 1

## 2018-12-14 MED ORDER — SUCRALFATE 1 G PO TABS
1.0000 g | ORAL_TABLET | Freq: Three times a day (TID) | ORAL | Status: DC
  Administered 2018-12-14 – 2018-12-15 (×3): 1 g via ORAL
  Filled 2018-12-14 (×3): qty 1

## 2018-12-14 MED ORDER — GABAPENTIN 100 MG PO CAPS
100.0000 mg | ORAL_CAPSULE | Freq: Three times a day (TID) | ORAL | Status: DC
  Filled 2018-12-14 (×2): qty 1

## 2018-12-14 MED ORDER — LISINOPRIL 10 MG PO TABS
10.0000 mg | ORAL_TABLET | Freq: Every day | ORAL | Status: DC
  Administered 2018-12-14: 10 mg via ORAL
  Filled 2018-12-14 (×2): qty 1

## 2018-12-14 MED ORDER — ASPIRIN EC 81 MG PO TBEC
81.0000 mg | DELAYED_RELEASE_TABLET | Freq: Every day | ORAL | Status: DC
  Administered 2018-12-14 – 2018-12-15 (×2): 81 mg via ORAL
  Filled 2018-12-14 (×2): qty 1

## 2018-12-14 MED ORDER — INSULIN ASPART 100 UNIT/ML ~~LOC~~ SOLN
0.0000 [IU] | Freq: Three times a day (TID) | SUBCUTANEOUS | Status: DC
  Administered 2018-12-14: 2 [IU] via SUBCUTANEOUS

## 2018-12-14 MED ORDER — ALBUTEROL SULFATE (2.5 MG/3ML) 0.083% IN NEBU
3.0000 mL | INHALATION_SOLUTION | Freq: Four times a day (QID) | RESPIRATORY_TRACT | Status: DC
  Administered 2018-12-14 – 2018-12-15 (×3): 3 mL via RESPIRATORY_TRACT
  Filled 2018-12-14 (×4): qty 3

## 2018-12-14 MED ORDER — NITROGLYCERIN 0.4 MG SL SUBL
0.4000 mg | SUBLINGUAL_TABLET | SUBLINGUAL | Status: DC | PRN
  Administered 2018-12-14: 0.4 mg via SUBLINGUAL
  Filled 2018-12-14: qty 1

## 2018-12-14 MED ORDER — ALBUTEROL SULFATE HFA 108 (90 BASE) MCG/ACT IN AERS: 2.0000 | INHALATION_SPRAY | Freq: Four times a day (QID) | RESPIRATORY_TRACT | Status: DC | PRN

## 2018-12-14 MED ORDER — CLONAZEPAM 0.5 MG PO TABS
0.5000 mg | ORAL_TABLET | Freq: Two times a day (BID) | ORAL | Status: DC | PRN
  Administered 2018-12-14: 0.5 mg via ORAL
  Filled 2018-12-14: qty 1

## 2018-12-14 MED ORDER — ISOSORBIDE MONONITRATE ER 30 MG PO TB24
30.0000 mg | ORAL_TABLET | Freq: Every day | ORAL | Status: DC
  Administered 2018-12-14 – 2018-12-15 (×2): 30 mg via ORAL
  Filled 2018-12-14 (×2): qty 1

## 2018-12-14 MED ORDER — MONTELUKAST SODIUM 10 MG PO TABS
10.0000 mg | ORAL_TABLET | Freq: Every day | ORAL | Status: DC
  Administered 2018-12-14: 10 mg via ORAL
  Filled 2018-12-14: qty 1

## 2018-12-14 NOTE — Plan of Care (Signed)

## 2018-12-14 NOTE — ED Notes (Signed)
Floor stating they do not take active CP and needs another bed assignment.

## 2018-12-14 NOTE — ED Triage Notes (Signed)
Pt from home via [facility], reports sharp left sided cp radiating to shoulder since this morning, cardiologist told him to come here. Pt given 324 ASA, 2x 0.4 nitro, some relief.  Reports black tar like stools since Saturday.

## 2018-12-14 NOTE — ED Notes (Signed)
Patient transported to X-ray 

## 2018-12-14 NOTE — Telephone Encounter (Signed)
New Message °

## 2018-12-14 NOTE — ED Provider Notes (Addendum)
[facility] Provider Note   CSN: [id number] Arrival date & time: 12/14/18  1120    History   Chief Complaint Chief Complaint  Patient presents with  . Chest Pain    HPI    [facility] is a 48 y.o. male with a PMHx of asthma, CAD (nonobstructive on cath 2011), DM2, GERd, HTN, HLD, and other conditions listed below, who presents to the ED with complaints of central chest pain that began around 8 AM while he was sitting at home.  He describes his chest pain is 7/10 constant sharp and heavy central chest pain that initially radiated to his left shoulder, with no known aggravating factors, and somewhat improved after 2 nitroglycerin and 4 baby aspirin's.  He reports associated palpitations that feels like a skipping beat intermittently, occipital headache, diaphoresis, and lightheadedness.  He states that his headache started at the same time that his chest pain did.  He has a positive family history of MI in his father.  He is a non-smoker.  His cardiologist is Dr. Debara Pickett.  His PCP is Dr. Inda Castle in [facility].  He denies any fevers, chills, cough, shortness of breath, leg swelling, recent travel/surgery/immobilization, personal/family history of DVT/PE, abdominal pain, nausea, vomiting, diarrhea, constipation, dysuria, hematuria, numbness, tingling, focal weakness, claudication, orthopnea, or any other complaints at this time.  The history is provided by the patient and medical records. No language interpreter was used.  Chest Pain  Associated symptoms: diaphoresis, headache and palpitations   Associated symptoms: no abdominal pain, no cough, no fever, no nausea, no numbness, no shortness of breath, no vomiting and no weakness     Past Medical History:  Diagnosis Date  . Aneurysm ([place])   . Asthma   . Blood transfusion   . CAD (coronary artery disease), non obstructive on cath 2011 04/05/2012  . Coronary artery disease   . Diabetes mellitus    . Diabetes mellitus without complication ([place])   . Enlarged prostate   . GERD (gastroesophageal reflux disease)   . H/O syncope   . Heart disease   . History of repair of patent ductus arteriosus 07/23/2016  . Hyperlipidemia   . Hypertension   . 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 Refusal of blood transfusions as patient is Jehovah's Witness     Patient Active Problem List   Diagnosis Date Noted  . Hyperlipidemia 10/23/2018  . Thoracic aortic aneurysm ([place]) 10/23/2018  . Right shoulder injury, initial encounter 06/18/2017  . Hypertrophic cardiomyopathy ([place]) 04/17/2017  . Trochanteric bursitis of right hip 10/17/2016  . History of repair of patent ductus arteriosus 07/23/2016  . Cervicalgia 07/08/2016  . Fibromyalgia 07/08/2016  . Cervical spondylosis without myelopathy 07/08/2016  . Essential hypertension 02/08/2016  . Lumbar facet arthropathy 10/19/2015  . Chronic lumbar radiculopathy 06/22/2015  . History of syncope 10/20/2014  . Urinary frequency 07/08/2014  . Allergic rhinitis 07/08/2014  . Lumbar radiculopathy, chronic 07/26/2013  . Morton's neuroma of left foot 03/18/2013  . Depression with anxiety 12/21/2012  . Insomnia 07/23/2012  . CAD (coronary artery disease), non obstructive on cath 2011 04/05/2012  . Right bundle branch block 04/04/2012  . Peripheral neuropathy 04/04/2012  . Chronic back pain 03/28/2011  . Palpitations 03/01/2011  . Type 2 diabetes, controlled, with peripheral neuropathy ([place]) 09/07/2010  . GERD 09/07/2010  . Coronary artery spasm ([place]) 01/31/2008  . OBESITY, NOS 11/16/2006  . ERECTILE DYSFUNCTION 11/16/2006  . Asthma with acute exacerbation 11/16/2006  Past Surgical History:  Procedure Laterality Date  . APPENDECTOMY    . BACK SURGERY    . CARDIAC CATHETERIZATION  2007   Clean Cardiac Cath ([place]), with RCA 30% narrowing likely due to catheter induced spasm in 09/02/10.  Marland Kitchen CARDIAC CATHETERIZATION  09/02/2010    mod. nonobstructive disease in the RCA and CX, tortuous LAD  . COLONOSCOPY W/ POLYPECTOMY  03/17/11   diminutive polyp  . LOOP RECORDER EXPLANT N/A 01/14/2014   Procedure: LOOP RECORDER EXPLANT;  Surgeon: Sanda Klein, MD;  Location: [facility];  Service: Cardiovascular;  Laterality: N/A;  . LOOP RECORDER IMPLANT  04/06/2012   Reveal XT 4098  . LOOP RECORDER IMPLANT N/A 04/06/2012   Procedure: LOOP RECORDER IMPLANT;  Surgeon: Sanda Klein, MD;  Location: [facility];  Service: Cardiovascular;  Laterality: N/A;  . NM MYOCAR PERF WALL MOTION  01/25/2008   mild anteroapical wall ischemia  . PATENT DUCTUS ARTERIOUS REPAIR     at age 27        Home Medications    Prior to Admission medications   Medication Sig Start Date End Date Taking? Authorizing Provider  albuterol (PROVENTIL) (2.5 MG/3ML) 0.083% nebulizer solution USE 1 VIAL VIA NEBULIZER  EVERY 6 HOURS AS NEEDED FOR WHEEZING OR SHORTNESS OF  BREATH Patient taking differently: Take 2.5 mg by nebulization every 6 (six) hours as needed for wheezing or shortness of breath.  04/17/18   [name], NP  aspirin 81 MG tablet Take 81 mg by mouth daily.     [provider]  atorvastatin (LIPITOR) 80 MG tablet TAKE 1 TABLET BY MOUTH  DAILY Patient taking differently: Take 80 mg by mouth daily.  06/25/18   [name], NP  atorvastatin (LIPITOR) 80 MG tablet TAKE 1 TABLET BY MOUTH  DAILY 11/26/18   [name], NP  atorvastatin (LIPITOR) 80 MG tablet Take 80 mg by mouth daily at 6 PM.  10/01/18   [provider]  cetirizine (ZYRTEC) 10 MG tablet Take 10 mg by mouth daily.    [provider]  clonazePAM (KLONOPIN) 1 MG tablet TAKE 1 TABLET BY MOUTH  DAILY Patient taking differently: Take 1 mg by mouth daily.  10/02/18   [name], NP  diclofenac sodium (VOLTAREN) 1 % GEL Apply 2 g topically 4 (four) times daily as needed (pain). 10/04/17   [name], NP  doxycycline (VIBRA-TABS) 100  MG tablet Take 1 tablet (100 mg total) by mouth 2 (two) times daily. 11/26/18   Saguier, Percell Miller, PA-C  DULoxetine (CYMBALTA) 30 MG capsule Take 30 mg by mouth daily. 10/04/18   [provider]  ezetimibe (ZETIA) 10 MG tablet TAKE 1 TABLET BY MOUTH  DAILY Patient taking differently: Take 10 mg by mouth daily.  10/02/18   [name], NP  fenofibrate (TRICOR) 145 MG tablet Take 1 tablet by mouth  daily 08/10/15   [name], NP  fluticasone (FLONASE) 50 MCG/ACT nasal spray Place 2 sprays into both nostrils as needed for allergies or rhinitis.    [provider]  Fluticasone-Salmeterol (WIXELA INHUB) 250-50 MCG/DOSE AEPB Inhale 1 puff into the lungs 2 (two) times daily. 09/04/18   [name], NP  gabapentin (NEURONTIN) 300 MG capsule TAKE 1 CAPSULE BY MOUTH AT  6AM, 11:30AM AND 4:30PM AND TAKE 3 CAPSULES AT 10:00PM 06/25/18   [name], NP  gabapentin (NEURONTIN) 300 MG capsule TAKE 1 CAPSULE BY MOUTH AT  6AM, 11:30AM AND 4:30PM AND TAKE 3 CAPSULES AT  10:00PM 11/26/18   [name], NP  HYDROcodone-homatropine (HYCODAN) 5-1.5 MG/5ML syrup Take 5 mLs by mouth every 6 (six) hours as needed. 11/26/18   Saguier, Percell Miller, PA-C  isosorbide mononitrate (IMDUR) 30 MG 24 hr tablet Take 1 tablet (30 mg total) by mouth daily. 10/26/16   Hedges, Dellis Filbert, PA-C  ketoconazole (NIZORAL) 2 % shampoo APPLY TOPICALLY DAILY AS  DIRECTED Patient taking differently: Apply 1 application topically 3 (three) times a week.  10/05/18   [name], NP  lisinopril (PRINIVIL,ZESTRIL) 10 MG tablet TAKE 1 TABLET BY MOUTH  DAILY Patient taking differently: Take 10 mg by mouth daily.  10/02/18   [name], NP  loperamide (IMODIUM) 2 MG capsule Take 1 capsule (2 mg total) by mouth as needed for diarrhea or loose stools. 01/20/16   Horton, Barbette Hair, MD  meclizine (ANTIVERT) 25 MG tablet Take 1 tablet (25 mg total) by mouth 3 (three) times daily as needed for dizziness.  06/17/18   Deno Etienne, DO  meloxicam (MOBIC) 7.5 MG tablet TAKE 1 TABLET BY MOUTH  DAILY Patient taking differently: Take 7.5 mg by mouth daily.  10/02/18   [name], NP  metFORMIN (GLUCOPHAGE-XR) 500 MG 24 hr tablet TAKE 2 TABLETS BY MOUTH  DAILY WITH BREAKFAST Patient taking differently: Take 500 mg by mouth 2 (two) times daily.  06/25/18   [name], NP  metFORMIN (GLUCOPHAGE-XR) 500 MG 24 hr tablet TAKE 2 TABLETS BY MOUTH  DAILY WITH BREAKFAST 11/26/18   [name], NP  metFORMIN (GLUCOPHAGE-XR) 500 MG 24 hr tablet Take 1,000 mg by mouth daily. 10/01/18   [provider]  metoprolol (LOPRESSOR) 50 MG tablet Take 1 tablet (50 mg total) by mouth once as directed. 11/04/16   Croitoru, Mihai, MD  metoprolol tartrate (LOPRESSOR) 100 MG tablet Take 1 tablet (100 mg total) by mouth once for 1 dose. TAKE 2 HOURS BEFORE YOUR PROCEDURE 10/23/18 10/23/18  Lorretta Harp, MD  montelukast (SINGULAIR) 10 MG tablet TAKE 1 TABLET BY MOUTH AT  BEDTIME Patient taking differently: Take 10 mg by mouth at bedtime.  06/25/18   [name], NP  montelukast (SINGULAIR) 10 MG tablet TAKE 1 TABLET BY MOUTH AT  BEDTIME 11/26/18   [name], NP  montelukast (SINGULAIR) 10 MG tablet Take 10 mg by mouth at bedtime. 10/01/18   [provider]  Multiple Vitamin (MULTIVITAMIN WITH MINERALS) TABS tablet Take 1 tablet by mouth daily.    [provider]  nitroGLYCERIN (NITROSTAT) 0.4 MG SL tablet Place 1 tablet (0.4 mg total) under the tongue every 5 (five) minutes as needed for chest pain. 11/11/15   [name], NP  ONETOUCH VERIO test strip USE WITH METER TO CHECK  BLOOD SUGAR 3 TIMES DAILY 10/02/18   [name], NP  predniSONE (DELTASONE) 10 MG tablet 6 TAB PO DAY 1 5 TAB PO DAY 2 4 TAB PO DAY 3 3 TAB PO DAY 4 2 TAB PO DAY 5 1 TAB PO DAY 6 11/26/18   Saguier, Iris Pert  [facility] HFA 108 (90 Base) MCG/ACT inhaler Inhale 2 puffs into the lungs  every 6 (six) hours as needed for wheezing or shortness of breath. 02/08/18   [name], NP  sertraline (ZOLOFT) 100 MG tablet TAKE 1 TABLET BY MOUTH  DAILY Patient taking differently: Take 100 mg by mouth daily.  10/02/18   [name], NP  tamsulosin (FLOMAX) 0.4 MG CAPS capsule TAKE 1 CAPSULE BY MOUTH  DAILY Patient taking differently: Take 0.4  mg by mouth daily.  10/02/18   [name], NP  tiZANidine (ZANAFLEX) 2 MG tablet TAKE 1 TABLET BY MOUTH  EVERY 6 HOURS AS NEEDED FOR MUSCLE SPASM(S) Patient taking differently: Take 2 mg by mouth every 6 (six) hours as needed for muscle spasms.  07/19/18   Kirsteins, Luanna Salk, MD  traMADol (ULTRAM) 50 MG tablet TAKE 1 TABLET BY MOUTH  EVERY 6 HOURS AS NEEDED FOR SEVERE PAIN Patient taking differently: Take 50 mg by mouth every 6 (six) hours as needed for severe pain. TAKE 1 TABLET BY MOUTH  EVERY 6 HOURS AS NEEDED FOR SEVERE PAIN 02/01/18   Kirsteins, Luanna Salk, MD  traZODone (DESYREL) 100 MG tablet Take 100 mg by mouth at bedtime. 09/19/18   [provider]  niacin (NIASPAN) 1000 MG CR tablet Take 1 tablet (1,000 mg total) by mouth at bedtime. 07/27/11 11/08/17  Burnice Logan, MD    Family History Family History  Problem Relation Age of Onset  . Colon cancer Paternal Grandfather   . Prostate cancer Paternal Grandfather   . Aneurysm Father   . Heart attack Father 62  . Coronary artery disease Father   . Colon polyps Mother   . Heart disease Mother   . Coronary artery disease Mother   . Migraines Mother   . Breast cancer Maternal Grandmother   . Colon cancer Paternal Uncle        x 2  . Stomach cancer Brother   . Liver disease Unknown        unsure who it was  . Allergies Daughter     Social History Social History   Tobacco Use  . Smoking status: Never Smoker  . Smokeless tobacco: Never Used  Substance Use Topics  . Alcohol use: No  . Drug use: No     Allergies   Benadryl [diphenhydramine  hcl]   Review of Systems Review of Systems  Constitutional: Positive for diaphoresis. Negative for chills and fever.  Respiratory: Negative for cough and shortness of breath.   Cardiovascular: Positive for chest pain and palpitations. Negative for leg swelling.  Gastrointestinal: Negative for abdominal pain, constipation, diarrhea, nausea and vomiting.  Genitourinary: Negative for dysuria and hematuria.  Musculoskeletal: Negative for arthralgias and myalgias.  Skin: Negative for color change.  Allergic/Immunologic: Positive for immunocompromised state (DM2).  Neurological: Positive for light-headedness and headaches. Negative for weakness and numbness.  Psychiatric/Behavioral: Negative for confusion.   All other systems reviewed and are negative for acute change except as noted in the HPI.    Physical Exam Updated Vital Signs BP 139/87   Pulse 97   Temp 98.1 F (36.7 C) (Oral)   Resp 11   SpO2 100%    Physical Exam Vitals signs and nursing note reviewed.  Constitutional:      General: He is not in acute distress.    Appearance: Normal appearance. He is well-developed. He is not toxic-appearing.     Comments: Afebrile, nontoxic, NAD  HENT:     Head: Normocephalic and atraumatic.  Eyes:     General:        Right eye: No discharge.        Left eye: No discharge.     Conjunctiva/sclera: Conjunctivae normal.  Neck:     Musculoskeletal: Normal range of motion and neck supple.  Cardiovascular:     Rate and Rhythm: Normal rate and regular rhythm.     Pulses: Normal pulses.     Heart sounds: Normal  heart sounds, S1 normal and S2 normal. No murmur. No friction rub. No gallop.      Comments: HR 98 during exam, RRR, nl s1/s2, no m/r/g, distal pulses intact, no pedal edema  Pulmonary:     Effort: Pulmonary effort is normal. No respiratory distress.     Breath sounds: Normal breath sounds. No decreased breath sounds, wheezing, rhonchi or rales.     Comments: CTAB in all lung  fields, no w/r/r, no hypoxia or increased WOB, speaking in full sentences, SpO2 100% on RA  Chest:     Chest wall: No deformity, tenderness or crepitus.     Comments: Chest wall nonTTP without crepitus, deformities, or retractions  Abdominal:     General: Bowel sounds are normal. There is no distension.     Palpations: Abdomen is soft. Abdomen is not rigid.     Tenderness: There is no abdominal tenderness. There is no right CVA tenderness, left CVA tenderness, guarding or rebound. Negative signs include Murphy's sign and McBurney's sign.  Musculoskeletal: Normal range of motion.     Comments: MAE x4 Strength and sensation grossly intact in all extremities Distal pulses intact Gait steady No pedal edema, neg homan's bilaterally   Skin:    General: Skin is warm and dry.     Findings: No rash.  Neurological:     General: No focal deficit present.     Mental Status: He is alert and oriented to person, place, and time.     GCS: GCS eye subscore is 4. GCS verbal subscore is 5. GCS motor subscore is 6.     Sensory: Sensation is intact. No sensory deficit.     Motor: Motor function is intact.     Coordination: Coordination is intact.     Gait: Gait is intact.     Comments: No focal neuro deficits  Psychiatric:        Mood and Affect: Mood and affect normal.        Behavior: Behavior normal.      ED Treatments / Results  Labs (all labs ordered are listed, but only abnormal results are displayed) Labs Reviewed  BASIC METABOLIC PANEL - Abnormal; Notable for the following components:      Result Value   Sodium 134 (*)    CO2 20 (*)    Glucose, Bld 131 (*)    All other components within normal limits  CBC - Abnormal; Notable for the following components:   WBC 10.7 (*)    RDW 15.6 (*)    All other components within normal limits  TROPONIN I    EKG EKG Interpretation  Date/Time:  Friday December 14 2018 11:25:59 EDT Ventricular Rate:  101 PR Interval:    QRS Duration: 98 QT  Interval:  333 QTC Calculation: 432 R Axis:   166 Text Interpretation:  Sinus tachycardia Probable right ventricular hypertrophy no STEMI, no sig change from previous Confirmed by Charlesetta Shanks [phone]) on 12/14/2018 11:28:50 AM   Radiology Dg Chest 2 View  Result Date: 12/14/2018 CLINICAL DATA:  Chest pain EXAM: CHEST - 2 VIEW COMPARISON:  November 26, 2018 FINDINGS: There is no appreciable edema or consolidation. Heart size and pulmonary vascularity are normal. No adenopathy. There is an azygos lobe on the right, an anatomic variant. No appreciable bone lesions. No pneumothorax. IMPRESSION: No edema or consolidation. Electronically Signed   By: Lowella Grip III M.D.   On: 12/14/2018 12:11   Ct Head Wo Contrast  Result Date: 12/14/2018  CLINICAL DATA:  Acute onset chest pain and headache today. EXAM: CT HEAD WITHOUT CONTRAST TECHNIQUE: Contiguous axial images were obtained from the base of the skull through the vertex without intravenous contrast. COMPARISON:  Head CT 10/26/2018 and 06/17/2018. FINDINGS: Brain: No evidence of acute infarction, hemorrhage, hydrocephalus, extra-axial collection or mass lesion/mass effect. Vascular: No hyperdense vessel or unexpected calcification. Skull: Intact.  No fracture or focal lesion. Sinuses/Orbits: Mucous retention cyst or polyp left maxillary sinus and mild mucosal thickening right sphenoid sinus noted. Other: None. IMPRESSION: No acute intracranial abnormality. Mucous retention cyst or polyp left maxillary sinus. Mild mucosal thickening right sphenoid sinus also noted. Electronically Signed   By: Inge Rise M.D.   On: 12/14/2018 12:48     Echo 10/23/14: Study Conclusions - Left ventricle: The cavity size was normal. There was moderate concentric hypertrophy. Systolic function was normal. The estimated ejection fraction was in the range of 60% to 65%. Wall motion was normal; there were no regional wall motion abnormalities. Left ventricular  diastolic function parameters were normal. - Aortic valve: There was no regurgitation. - Ascending aorta: The ascending aorta was normal in size. - Mitral valve: Structurally normal valve. There was no significant regurgitation. - Left atrium: The atrium was normal in size. - Right ventricle: The cavity size was normal. Wall thickness was normal. Systolic pressure was within the normal range. - Atrial septum: No defect or patent foramen ovale was identified. - Tricuspid valve: Structurally normal valve. There was trivial regurgitation. - Pulmonic valve: There was no regurgitation. - Pulmonary arteries: Systolic pressure was within the normal range. - Inferior vena cava: The vessel was normal in size. The respirophasic diameter changes were in the normal range (= 50%), consistent with normal central venous pressure. - Pericardium, extracardiac: The pericardium was normal in appearance.  Impressions: - Moderate concentric LVH, otherwise normal study.   Myocardial Stress Test 11/12/14: Overall Impression:  Low risk stress nuclear study with a small, moderate intensity, fixed distal anterior/apical defect suggestive of prominent thinning vs soft tissue attenuation; no significant ischemia.   Procedures Procedures (including critical care time)  Medications Ordered in ED Medications - No data to display   Initial Impression / Assessment and Plan / ED Course  I have reviewed the triage vital signs and the nursing notes.  Pertinent labs & imaging results that were available during my care of the patient were reviewed by me and considered in my medical decision making (see chart for details).        48 y.o. male here with chest pain that began this morning.  Also having palpitations.  Also complains of an occipital headache that began around the same time.  On exam, heart rate 98 during exam, no tachycardia, no hypoxia, clear lung sounds, no chest wall tenderness,  no abdominal tenderness, no pedal edema.  No focal neuro deficits.  EKG nonischemic.  Will get labs, chest x-ray, and CT head considering patient is concerned about his headache, patient declines wanting anything for pain and has already received NTG and ASA, will reassess shortly.  1:16 PM CBC with marginally elevated WBC 10.7. BMP with gluc 131, but otherwise fairly unremarkable. Trop neg. CXR negative for acute findings. CT head without acute findings, incidental mucous retention cyst of polyp of left maxillary sinus and mild mucosal thickening of right sphenoid sinus noted. Will proceed with admission for ACS r/o given his high HEART score/risk factors. Discussed case with my attending Dr. Johnney Killian who agrees with plan.  1:32 PM Dr. Earnest Conroy of [facility] returning page and will admit. Holding orders to be placed by admitting team. Please see their notes for further documentation of care. I appreciate their help with this pleasant pt's care. Pt stable at time of admission.   ADDENDUM 1:41 PM  Dr. Earnest Conroy of [facility] calling back, would like me to touch base with cardiology to see if they would evaluate the pt regarding this admission. Will consult them.   1:55 PM Trish of cardiology returning page, requests that [facility] consult directly if they need a consult. Advised Dr. Earnest Conroy of this, she will consult further if needed. Please see their notes for further documentation of care/dispo. Pt stable.    Final Clinical Impressions(s) / ED Diagnoses   Final diagnoses:  Precordial chest pain  Palpitations    ED Discharge Orders    [address] [place], [place] 12/14/18 Ken [place], [place] 12/14/18 1356    Charlesetta Shanks, MD 12/15/18 [phone]

## 2018-12-14 NOTE — Telephone Encounter (Signed)
Received a call from patient who was sitting in the ambulance with active chest pain. Patient wanted to come to the office to be seen. I explained that if he was actively having chest pains he needed to go to ED. Patient preferred coming to office, again advised to go to ED. EMS got on the phone and stated EKG ok and blood pressure 158/96 and stated patient reluctant on going to ED since there were positive COVID 19 cases there. I explained that if patient actively having chest pains patient needed to go to ED for evaluation and treatment we could not give patient in the office. Spoke back with patient and he verbalized understanding.

## 2018-12-14 NOTE — Telephone Encounter (Signed)
Thanks! Agree. ° °Dr H °

## 2018-12-14 NOTE — H&P (Signed)
History and Physical    DOA: 12/14/2018  PCP: [name], NP  Patient coming from: Home  Chief Complaint: Chest pain  HPI: Cody Raymond is a 48 y.o. male with history h/o hypertension, hyperlipidemia, asthma, nonobstructive CAD per cath 2011, diabetes mellitus on oral hypoglycemics, GERD and anxiety presents with complaints of chest pain episodes.  Patient has several antihypertensives/antihyperlipidemics listed on his home medication list but he states he is only been taking lisinopril 10 mg daily and atorvastatin 80 mg daily in addition to psych medications.  He was previously prescribed Imdur but stopped taking.  He states he was advised to take metoprolol (not on scheduled dosing) before getting CT coronary plan by cardiology.  Patient reports completing a two-week course of doxycycline and prednisone therapy for acute sinusitis last week. This morning he was sitting in a chair watching TV when he had sudden onset of left parasternal chest pain, stabbing, 10/10, radiating to jaw and left shoulder associated with palpitations, "fire like sensation in the back of my head and my entire body", nausea and sweating.  On EMS arrival his blood pressure was elevated and blood glucose was at 247.  Patient states he checks his blood pressure twice a day and noticed his systolic blood pressure was elevated and 140s to 150s since yesterday.  Patient apparently did not want to come to the ED to avoid CoVid exposure, hence EMS left.  He however had recurrence of chest pain and EMS this time brought him to the ED.  He received sublingual nitro x2 and aspirin x2 through both episodes with minimal relief.  He states he feels better when he lays down but has recurrence of chest pain with minimal exertion. Work-up in the ED revealed normal EKG with no acute ST-T changes and troponin x1-ve.  CT head negative for any acute issues.He is requested to be admitted for further evaluation and management.   Patient's blood pressure currently is elevated at 154/110 on the monitor and he has been tachycardic with heart rate in the 120s in the ED. He rates current chest pain at 5/10.  No headache currently denies any tingling or numbness in extremities.He denies any history of smoking alcohol or drug abuse.He does have family history of heart disease in his mother and father apparently passed away with cerebral aneurysmal bleed in his 17s.  Patient does appear anxious and states he was supposed to start Cymbalta prescribed by his PCP which he has not started yet.  Review of Systems: As per HPI otherwise 10 point review of systems negative.  He denies any fever or chills or sick contacts.  He denies any travel history or contact with covid -positive persons.   Past Medical History:  Diagnosis Date  . Aneurysm ([place])   . Asthma   . Blood transfusion   . CAD (coronary artery disease), non obstructive on cath 2011 04/05/2012  . Coronary artery disease   . Diabetes mellitus   . Diabetes mellitus without complication ([place])   . Enlarged prostate   . GERD (gastroesophageal reflux disease)   . H/O syncope   . Heart disease   . History of repair of patent ductus arteriosus 07/23/2016  . Hyperlipidemia   . Hypertension   . 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 Refusal of blood transfusions as patient is Jehovah's Witness     Past Surgical History:  Procedure Laterality Date  . APPENDECTOMY    . BACK SURGERY    . CARDIAC CATHETERIZATION  2007   [facility] Card[facility] Cath [facility]), with RCA 30% narrowing likely due to catheter induced spasm in 09/02/10.  [facility] Kitchen CARDIAC CATHETERIZATION  09/02/2010   mod. nonobstructive disease in the RCA and CX, tortuous LAD  . COLONOSCOPY W/ POLYPECTOMY  03/17/11   diminutive polyp  . LOOP RECORDER EXPLANT N/A 01/14/2014   Procedure: LOOP RECORDER EXPLANT;  Surgeon: Sanda Klein, MD;  Location: [facility] CATH [facility];  Service: Cardiovascular;  Laterality: N/A;  . LOOP RECORDER  IMPLANT  04/06/2012   Reveal XT [id number]  . LOOP RECORDER IMPLANT N/A 04/06/2012   Procedure: LOOP RECORDER IMPLANT;  Surgeon: Sanda Klein, MD;  Location: [facility];  Service: Cardiovascular;  Laterality: N/A;  . NM MYOCAR PERF WALL MOTION  01/25/2008   mild anteroapical wall ischemia  . PATENT DUCTUS ARTERIOUS REPAIR     at age 60    Social history:  reports that he has never smoked. He has never used smokeless tobacco. He reports that he does not drink alcohol or use drugs.   Allergies  Allergen Reactions  . Benadryl [Diphenhydramine Hcl]     "drives me nuts"    Family History  Problem Relation Age of Onset  . Colon cancer Paternal Grandfather   . Prostate cancer Paternal Grandfather   . Aneurysm Father   . Heart attack Father 16  . Coronary artery disease Father   . Colon polyps Mother   . Heart disease Mother   . Coronary artery disease Mother   . Migraines Mother   . Breast cancer Maternal Grandmother   . Colon cancer Paternal Uncle        x 2  . Stomach cancer Brother   . Liver disease Other        unsure who it was  . Allergies Daughter       Prior to Admission medications   Medication Sig Start Date End Date Taking? Authorizing Provider  albuterol (PROVENTIL) (2.5 MG/3ML) 0.083% nebulizer solution USE 1 VIAL VIA NEBULIZER  EVERY 6 HOURS AS NEEDED FOR WHEEZING OR SHORTNESS OF  BREATH Patient taking differently: Take 2.5 mg by nebulization every 6 (six) hours as needed for wheezing or shortness of breath.  04/17/18  Yes [name], NP  Ascorbic Acid (VITAMIN C) 1000 MG tablet Take 1,000 mg by mouth daily.   Yes [provider]  aspirin 81 MG tablet Take 81 mg by mouth daily.    Yes [provider]  atorvastatin (LIPITOR) 80 MG tablet TAKE 1 TABLET BY MOUTH  DAILY Patient taking differently: Take 80 mg by mouth daily.  06/25/18  Yes [name], NP  bismuth subsalicylate (PEPTO BISMOL) 262 MG/15ML suspension Take 30 mLs by mouth  every 6 (six) hours as needed for indigestion.   Yes [provider]  budesonide (PULMICORT) 0.5 MG/2ML nebulizer solution Inhale 2 mLs into the lungs every 6 (six) hours as needed for shortness of breath. 12/14/18  Yes [provider]  cetirizine (ZYRTEC) 10 MG tablet Take 10 mg by mouth daily.   Yes [provider]  clonazePAM (KLONOPIN) 1 MG tablet TAKE 1 TABLET BY MOUTH  DAILY Patient taking differently: Take 1 mg by mouth daily.  10/02/18  Yes [name], NP  diclofenac sodium (VOLTAREN) 1 % GEL Apply 2 g topically 4 (four) times daily as needed (pain). 10/04/17  Yes [name], NP  fluticasone (FLONASE) 50 MCG/ACT nasal spray Place 2 sprays into both nostrils as needed for allergies or rhinitis.  Yes [provider]  ketoconazole (NIZORAL) 2 % shampoo APPLY TOPICALLY DAILY AS  DIRECTED Patient taking differently: Apply 1 application topically 3 (three) times a week.  10/05/18  Yes [name], NP  lisinopril (PRINIVIL,ZESTRIL) 10 MG tablet TAKE 1 TABLET BY MOUTH  DAILY Patient taking differently: Take 10 mg by mouth daily.  10/02/18  Yes [name], NP  meclizine (ANTIVERT) 25 MG tablet Take 1 tablet (25 mg total) by mouth 3 (three) times daily as needed for dizziness. 06/17/18  Yes Deno Etienne, DO  meloxicam (MOBIC) 7.5 MG tablet TAKE 1 TABLET BY MOUTH  DAILY Patient taking differently: Take 7.5 mg by mouth daily as needed for pain.  10/02/18  Yes [name], NP  metFORMIN (GLUCOPHAGE-XR) 500 MG 24 hr tablet TAKE 2 TABLETS BY MOUTH  DAILY WITH BREAKFAST Patient taking differently: Take 1,000 mg by mouth daily with breakfast.  11/26/18  Yes [name], NP  montelukast (SINGULAIR) 10 MG tablet TAKE 1 TABLET BY MOUTH AT  BEDTIME Patient taking differently: Take 10 mg by mouth at bedtime.  06/25/18  Yes [name], NP  Multiple Vitamin (MULTIVITAMIN WITH MINERALS) TABS tablet Take 1 tablet by mouth daily.    Yes [provider]  nitroGLYCERIN (NITROSTAT) 0.4 MG SL tablet Place 1 tablet (0.4 mg total) under the tongue every 5 (five) minutes as needed for chest pain. 11/11/15  Yes [name], NP  tamsulosin (FLOMAX) 0.4 MG CAPS capsule TAKE 1 CAPSULE BY MOUTH  DAILY Patient taking differently: Take 0.4 mg by mouth daily.  10/02/18  Yes [name], NP  traMADol (ULTRAM) 50 MG tablet TAKE 1 TABLET BY MOUTH  EVERY 6 HOURS AS NEEDED FOR SEVERE PAIN Patient taking differently: Take 50 mg by mouth every 6 (six) hours as needed for severe pain. TAKE 1 TABLET BY MOUTH  EVERY 6 HOURS AS NEEDED FOR SEVERE PAIN 02/01/18  Yes Kirsteins, Luanna Salk, MD  traZODone (DESYREL) 100 MG tablet Take 100 mg by mouth at bedtime. 09/19/18  Yes [provider]  amoxicillin-clavulanate (AUGMENTIN) 875-125 MG tablet Take 1 tablet by mouth 2 (two) times daily. Started on 12-04-18 7 ds 12/04/18   [provider]  atorvastatin (LIPITOR) 80 MG tablet TAKE 1 TABLET BY MOUTH  DAILY Patient not taking: Reported on 12/14/2018 11/26/18   [name], NP  atorvastatin (LIPITOR) 80 MG tablet Take 80 mg by mouth daily at 6 PM.  10/01/18   [provider]  doxycycline (VIBRA-TABS) 100 MG tablet Take 1 tablet (100 mg total) by mouth 2 (two) times daily. Patient not taking: Reported on 12/14/2018 11/26/18   Saguier, Percell Miller, PA-C  DULoxetine (CYMBALTA) 30 MG capsule Take 30 mg by mouth daily. 10/04/18   [provider]  ezetimibe (ZETIA) 10 MG tablet TAKE 1 TABLET BY MOUTH  DAILY Patient not taking: No sig reported 10/02/18   [name], NP  fenofibrate (TRICOR) 145 MG tablet Take 1 tablet by mouth  daily Patient not taking: Reported on 12/14/2018 08/10/15   [name], NP  Fluticasone-Salmeterol [facility]) 250-50 MCG/DOSE AEPB Inhale 1 puff into the lungs 2 (two) times daily. Patient not taking: Reported on 12/14/2018 09/04/18   [name], NP  gabapentin  (NEURONTIN) 300 MG capsule TAKE 1 CAPSULE BY MOUTH AT  6AM, 11:30AM AND 4:30PM AND TAKE 3 CAPSULES AT 10:00PM Patient not taking: Reported on 12/14/2018 06/25/18   [name], NP  gabapentin (NEURONTIN) 300 MG capsule TAKE 1 CAPSULE BY MOUTH AT  6AM,  11:30AM AND 4:30PM AND TAKE 3 CAPSULES AT 10:00PM Patient not taking: Reported on 12/14/2018 11/26/18   [name], NP  HYDROcodone-homatropine [facility]) 5-1.5 MG/5ML syrup Take 5 mLs by mouth every 6 (six) hours as needed. Patient not taking: Reported on 12/14/2018 11/26/18   Saguier, Percell Miller, PA-C  isosorbide mononitrate (IMDUR) 30 MG 24 hr tablet Take 1 tablet (30 mg total) by mouth daily. Patient not taking: Reported on 12/14/2018 10/26/16   Okey Regal, PA-C  loperamide (IMODIUM) 2 MG capsule Take 1 capsule (2 mg total) by mouth as needed for diarrhea or loose stools. Patient not taking: Reported on 12/14/2018 01/20/16   Horton, Barbette Hair, MD  metFORMIN (GLUCOPHAGE-XR) 500 MG 24 hr tablet TAKE 2 TABLETS BY MOUTH  DAILY WITH BREAKFAST Patient not taking: No sig reported 06/25/18   [name], NP  metFORMIN (GLUCOPHAGE-XR) 500 MG 24 hr tablet Take 1,000 mg by mouth daily. 10/01/18   [provider]  metoprolol (LOPRESSOR) 50 MG tablet Take 1 tablet (50 mg total) by mouth once as directed. 11/04/16   Croitoru, Mihai, MD  metoprolol tartrate (LOPRESSOR) 100 MG tablet Take 1 tablet (100 mg total) by mouth once for 1 dose. TAKE 2 HOURS BEFORE YOUR PROCEDURE 10/23/18 10/23/18  Lorretta Harp, MD  montelukast (SINGULAIR) 10 MG tablet TAKE 1 TABLET BY MOUTH AT  BEDTIME Patient not taking: Reported on 12/14/2018 11/26/18   [name], NP  montelukast (SINGULAIR) 10 MG tablet Take 10 mg by mouth at bedtime. 10/01/18   [provider]  ONETOUCH VERIO test strip USE WITH METER TO CHECK  BLOOD SUGAR 3 TIMES DAILY 10/02/18   [name], NP  predniSONE (DELTASONE) 10 MG tablet 6 TAB PO DAY 1 5 TAB PO DAY 2 4 TAB PO  DAY 3 3 TAB PO DAY 4 2 TAB PO DAY 5 1 TAB PO DAY 6 Patient not taking: Reported on 12/14/2018 11/26/18   Saguier, Percell Miller, PA-C  PROAIR HFA 108 (90 Base) MCG/ACT inhaler Inhale 2 puffs into the lungs every 6 (six) hours as needed for wheezing or shortness of breath. Patient not taking: Reported on 12/14/2018 02/08/18   [name], NP  sertraline (ZOLOFT) 100 MG tablet TAKE 1 TABLET BY MOUTH  DAILY Patient not taking: No sig reported 10/02/18   [name], NP  niacin (NIASPAN) 1000 MG CR tablet Take 1 tablet (1,000 mg total) by mouth at bedtime. 07/27/11 11/08/17  Burnice Logan, MD    Physical Exam: Vitals:   12/14/18 1430 12/14/18 1445 12/14/18 1458 12/14/18 1500  BP: 135/86 137/83  136/85  Pulse: (!) 119 (!) 113  (!) 104  Resp: 15 14  16   Temp:      TempSrc:      SpO2: 98% 98% 100% 100%    Constitutional: NAD, calm, comfortable Eyes: PERRL, lids and conjunctivae normal ENMT: Mucous membranes are moist. Posterior pharynx clear of any exudate or lesions.Normal dentition.  Neck: normal, supple, no masses, no thyromegaly Respiratory: clear to auscultation bilaterally, no wheezing, no crackles. Normal respiratory effort. No accessory muscle use.  Cardiovascular: Regular rate and rhythm, no murmurs / rubs / gallops. No extremity edema. 2+ pedal pulses. No carotid bruits.  Abdomen: no tenderness, no masses palpated. No hepatosplenomegaly. Bowel sounds positive.  Musculoskeletal: no clubbing / cyanosis. No joint deformity upper and lower extremities. Good ROM, no contractures. Normal muscle tone.  Neurologic: CN 2-12 grossly intact. Sensation intact, DTR normal. Strength 5/5 in all 4.  Psychiatric: Normal judgment and  insight. Alert and oriented x 3. Normal mood.  SKIN/catheters: no rashes, lesions, ulcers. No induration  Labs on Admission: I have personally reviewed following labs and imaging studies  CBC: Recent Labs  Lab 12/14/18 1143 12/14/18 1405  WBC 10.7* 11.1*   HGB 13.2 14.5  HCT 41.9 44.0  MCV 82.5 80.7  PLT 263 378   Basic Metabolic Panel: Recent Labs  Lab 12/14/18 1143 12/14/18 1405  NA 134*  --   K 3.6  --   CL 102  --   CO2 20*  --   GLUCOSE 131*  --   BUN 12  --   CREATININE 0.76 0.79  CALCIUM 8.9  --    GFR: CrCl cannot be calculated (Unknown ideal weight.). Liver Function Tests: No results for input(s): AST, ALT, ALKPHOS, BILITOT, PROT, ALBUMIN in the last 168 hours. No results for input(s): LIPASE, AMYLASE in the last 168 hours. No results for input(s): AMMONIA in the last 168 hours. Coagulation Profile: No results for input(s): INR, PROTIME in the last 168 hours. Cardiac Enzymes: Recent Labs  Lab 12/14/18 1143  TROPONINI <0.03   BNP (last 3 results) No results for input(s): PROBNP in the last 8760 hours. HbA1C: No results for input(s): HGBA1C in the last 72 hours. CBG: No results for input(s): GLUCAP in the last 168 hours. Lipid Profile: No results for input(s): CHOL, HDL, LDLCALC, TRIG, CHOLHDL, LDLDIRECT in the last 72 hours. Thyroid Function Tests: No results for input(s): TSH, T4TOTAL, FREET4, T3FREE, THYROIDAB in the last 72 hours. Anemia Panel: No results for input(s): VITAMINB12, FOLATE, FERRITIN, TIBC, IRON, RETICCTPCT in the last 72 hours. Urine analysis:    Component Value Date/Time   COLORURINE YELLOW 10/26/2018 2310   APPEARANCEUR CLEAR 10/26/2018 2310   LABSPEC 1.045 (H) 10/26/2018 2310   PHURINE 7.0 10/26/2018 2310   GLUCOSEU NEGATIVE 10/26/2018 2310   GLUCOSEU NEGATIVE 07/25/2017 0843   HGBUR NEGATIVE 10/26/2018 2310   BILIRUBINUR NEGATIVE 10/26/2018 2310   BILIRUBINUR negative 01/16/2017 [place] 10/26/2018 2310   PROTEINUR NEGATIVE 10/26/2018 2310   UROBILINOGEN 0.2 07/25/2017 0843   NITRITE NEGATIVE 10/26/2018 2310   LEUKOCYTESUR NEGATIVE 10/26/2018 2310    Radiological Exams on Admission: Dg Chest 2 View  Result Date: 12/14/2018 CLINICAL DATA:  Chest pain  EXAM: CHEST - 2 VIEW COMPARISON:  November 26, 2018 FINDINGS: There is no appreciable edema or consolidation. Heart size and pulmonary vascularity are normal. No adenopathy. There is an azygos lobe on the right, an anatomic variant. No appreciable bone lesions. No pneumothorax. IMPRESSION: No edema or consolidation. Electronically Signed   By: Lowella Grip III M.D.   On: 12/14/2018 12:11   Ct Head Wo Contrast  Result Date: 12/14/2018 CLINICAL DATA:  Acute onset chest pain and headache today. EXAM: CT HEAD WITHOUT CONTRAST TECHNIQUE: Contiguous axial images were obtained from the base of the skull through the vertex without intravenous contrast. COMPARISON:  Head CT 10/26/2018 and 06/17/2018. FINDINGS: Brain: No evidence of acute infarction, hemorrhage, hydrocephalus, extra-axial collection or mass lesion/mass effect. Vascular: No hyperdense vessel or unexpected calcification. Skull: Intact.  No fracture or focal lesion. Sinuses/Orbits: Mucous retention cyst or polyp left maxillary sinus and mild mucosal thickening right sphenoid sinus noted. Other: None. IMPRESSION: No acute intracranial abnormality. Mucous retention cyst or polyp left maxillary sinus. Mild mucosal thickening right sphenoid sinus also noted. Electronically Signed   By: Inge Rise M.D.   On: 12/14/2018 12:48    EKG: Independently reviewed.  Normal sinus rhythm with no acute ST-T changes     Assessment and Plan:   1.  Atypical chest pain: Cardiac versus GI related.  Patient appears very anxious and tachycardic.  His last cardiac cath was in 2011.  He had a negative stress test in February 2016 with echo showing moderate concentric LVH/EF of 63%.  EKG and cardiac enzymes reassuring.  Given risk factors of hypertension, hyperlipidemia, diabetes, uncontrolled blood pressure, family and personal history of CAD, will admit to observation status and cycle cardiac enzymes overnight.  Will obtain echo to rule out wall motion  abnormalities.  Discussed with cardiologist on-call Dr. Stanford Breed who recommended outpatient follow-up for stress test versus cardiac CT, if no acute issues overnight and echo normal.  He does have a history of GERD and also completed her doxycycline course recently.  Resume PPI and add sucralfate for possible pill induced esophagitis/gastritis  2.  Hypertension, uncontrolled: Will admit with beta-blockers and resume home dose of lisinopril.  Will also add nitrates that he was taking previously and monitor blood pressure overnight.  3.  Sinus tachycardia: Likely related to anxiety.  Will check TSH.  Beta-blockers as discussed above.  4.  Diabetes mellitus: Resume metformin.  Check hemoglobin A1c.  Patient did have hyperglycemia during recent prednisone course.  Sliding scale insulin  5. Hyperlipidemia: Resume statins.  Lipid profile in a.m.  Patient does not take fenofibrate or Zetia anymore.  6.  Depression/anxiety disorder: Patient reports taking?  Zoloft as needed for anxiety.  He was previously on clonazepam.  He has not started taking Cymbalta yet and he does not want to start now.  Will have clonazepam available PRN.  Patient advised the delayed mechanism of action for Zoloft and not to be used as needed.  Advised follow-up with primary psychiatrist/PCP  7.  Headache: Patient described occipital headache with the first episode of chest pain.  He appears to be worried about family history of cerebral aneurysm.  CT head in the ED negative and currently does not have any headache or focal symptoms.  Patient informed of CT results but also advised of ineffectiveness of CT imaging for aneurysm.  To follow-up PCP for appropriate screening if qualifies.  8. GERD: PPI  DVT prophylaxis: Lovenox  Code Status: Full code  Family Communication: Discussed with patient. Health care proxy would be his mother Consults called: Discussed with cardiology Dr. Stanford Breed Admission status:  Patient admitted as  observation as anticipated LOS less than 2 midnights    Guilford Shi MD [facility] Pager [phone]  If 7PM-7AM, please contact night-coverage [email or web address] Password [facility]  12/14/2018, 3:35 PM

## 2018-12-14 NOTE — ED Notes (Addendum)
ED TO INPATIENT HANDOFF REPORT  ED Nurse Name and Phone #: : Lonn Georgia [phone] S Name/Age/Gender Cody Raymond 48 y.o. male Room/Bed: 042C/042C  Code Status   Code Status: Full Code  Home/SNF/Other Home Patient oriented to: self, place, time and situation Is this baseline? Yes   Triage Complete: Triage complete  Chief Complaint cp  Triage Note Pt from home via [facility], reports sharp left sided cp radiating to shoulder since this morning, cardiologist told him to come here. Pt given 324 ASA, 2x 0.4 nitro, some relief.  Reports black tar like stools since Saturday.    Allergies Allergies  Allergen Reactions  . Benadryl [Diphenhydramine Hcl]     "drives me nuts"    Level of Care/Admitting Diagnosis ED Disposition    ED Disposition Condition [facility] Area: [place] [[id number]]  Level of Care: Telemetry Cardiac [103]  I expect the patient will be discharged within 24 hours: Yes  LOW acuity---Tx typically complete <24 hrs---ACUTE conditions typically can be evaluated <24 hours---LABS likely to return to acceptable levels <24 hours---IS near functional baseline---EXPECTED to return to current living arrangement---NOT newly hypoxic: Meets criteria for 5C-Observation unit  Diagnosis: Chest pain [945859]  Admitting Physician: Guilford Shi [[id number]]  Attending Physician: Guilford Shi [[id number]]  PT Class (Do Not Modify): Observation [104]  PT Acc Code (Do Not Modify): Observation [10022]       B Medical/Surgery History Past Medical History:  Diagnosis Date  . Aneurysm ([place])   . Asthma   . Blood transfusion   . CAD (coronary artery disease), non obstructive on cath 2011 04/05/2012  . Coronary artery disease   . Diabetes mellitus   . Diabetes mellitus without complication ([place])   . Enlarged prostate   . GERD (gastroesophageal reflux disease)   . H/O syncope   . Heart disease   . History of repair of patent ductus arteriosus  07/23/2016  . Hyperlipidemia   . Hypertension   . 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 Refusal of blood transfusions as patient is Jehovah's Witness    Past Surgical History:  Procedure Laterality Date  . APPENDECTOMY    . BACK SURGERY    . CARDIAC CATHETERIZATION  2007   Clean Cardiac Cath ([place]), with RCA 30% narrowing likely due to catheter induced spasm in 09/02/10.  Marland Kitchen CARDIAC CATHETERIZATION  09/02/2010   mod. nonobstructive disease in the RCA and CX, tortuous LAD  . COLONOSCOPY W/ POLYPECTOMY  03/17/11   diminutive polyp  . LOOP RECORDER EXPLANT N/A 01/14/2014   Procedure: LOOP RECORDER EXPLANT;  Surgeon: Sanda Klein, MD;  Location: [facility] CATH LAB;  Service: Cardiovascular;  Laterality: N/A;  . LOOP RECORDER IMPLANT  04/06/2012   Reveal XT 1165  . LOOP RECORDER IMPLANT N/A 04/06/2012   Procedure: LOOP RECORDER IMPLANT;  Surgeon: Sanda Klein, MD;  Location: [facility] CATH LAB;  Service: Cardiovascular;  Laterality: N/A;  . NM MYOCAR PERF WALL MOTION  01/25/2008   mild anteroapical wall ischemia  . PATENT DUCTUS ARTERIOUS REPAIR     at age 12     A IV Location/Drains/Wounds Patient Lines/Drains/Airways Status   Active Line/Drains/Airways    Name:   Placement date:   Placement time:   Site:   Days:   Peripheral IV 12/14/18 Left Forearm   12/14/18    1127    Forearm   less than 1   Wound 04/04/12 Laceration Leg Upper;Left   04/04/12  0211    Leg   2445   Wound 04/04/12 Laceration Arm Left;Lower   04/04/12    0213    Arm   2445   Wound 04/04/12 Laceration Hip Left Laceration to left hip   04/04/12    1945    Hip   2445          Intake/Output Last 24 hours No intake or output data in the 24 hours ending 12/14/18 1413  Labs/Imaging Results for orders placed or performed during the hospital encounter of 12/14/18 (from the past 48 hour(s))  Basic metabolic panel     Status: Abnormal   Collection Time: 12/14/18 11:43 AM  Result Value Ref Range   Sodium 134 (L) 135  - 145 mmol/L   Potassium 3.6 3.5 - 5.1 mmol/L   Chloride 102 98 - 111 mmol/L   CO2 20 (L) 22 - 32 mmol/L   Glucose, Bld 131 (H) 70 - 99 mg/dL   BUN 12 6 - 20 mg/dL   Creatinine, Ser 0.76 0.61 - 1.24 mg/dL   Calcium 8.9 8.9 - 10.3 mg/dL   GFR calc non Af Amer >60 >60 mL/min   GFR calc Af Amer >60 >60 mL/min   Anion gap 12 5 - 15    Comment: Performed at [facility], [place] [address] [place], [place] [address]  CBC     Status: Abnormal   Collection Time: 12/14/18 11:43 AM  Result Value Ref Range   WBC 10.7 (H) 4.0 - 10.5 K/uL   RBC 5.08 4.22 - 5.81 MIL/uL   Hemoglobin 13.2 13.0 - 17.0 g/dL   HCT 41.9 39.0 - 52.0 %   MCV 82.5 80.0 - 100.0 fL   MCH 26.0 26.0 - 34.0 pg   MCHC 31.5 30.0 - 36.0 g/dL   RDW 15.6 (H) 11.5 - 15.5 %   Platelets 263 150 - 400 K/uL   nRBC 0.0 0.0 - 0.2 %    Comment: Performed at [facility], [place] [address] [place], [place] [address]  Troponin I - Once     Status: None   Collection Time: 12/14/18 11:43 AM  Result Value Ref Range   Troponin I <0.03 <0.03 ng/mL    Comment: Performed at [facility] [address] [place], [place] [address]   Dg Chest 2 View  Result Date: 12/14/2018 CLINICAL DATA:  Chest pain EXAM: CHEST - 2 VIEW COMPARISON:  November 26, 2018 FINDINGS: There is no appreciable edema or consolidation. Heart size and pulmonary vascularity are normal. No adenopathy. There is an azygos lobe on the right, an anatomic variant. No appreciable bone lesions. No pneumothorax. IMPRESSION: No edema or consolidation. Electronically Signed   By: Lowella Grip III M.D.   On: 12/14/2018 12:11   Ct Head Wo Contrast  Result Date: 12/14/2018 CLINICAL DATA:  Acute onset chest pain and headache today. EXAM: CT HEAD WITHOUT CONTRAST TECHNIQUE: Contiguous axial images were obtained from the base of the skull through the vertex without intravenous contrast. COMPARISON:  Head CT 10/26/2018 and 06/17/2018. FINDINGS: Brain: No evidence of acute  infarction, hemorrhage, hydrocephalus, extra-axial collection or mass lesion/mass effect. Vascular: No hyperdense vessel or unexpected calcification. Skull: Intact.  No fracture or focal lesion. Sinuses/Orbits: Mucous retention cyst or polyp left maxillary sinus and mild mucosal thickening right sphenoid sinus noted. Other: None. IMPRESSION: No acute intracranial abnormality. Mucous retention cyst or polyp left maxillary sinus. Mild mucosal thickening right sphenoid sinus also noted.  Electronically Signed   By: Inge Rise M.D.   On: 12/14/2018 12:48    Pending Labs Unresulted Labs (From admission, onward)    Start     Ordered   12/21/18 0500  Creatinine, serum  (enoxaparin (LOVENOX)    CrCl >/= 30 ml/min)  Weekly,   R    Comments:  while on enoxaparin therapy    12/14/18 1406   12/15/18 8469  Basic metabolic panel  Tomorrow morning,   R     12/14/18 1406   12/15/18 0500  CBC  Tomorrow morning,   R     12/14/18 1406   12/14/18 1406  TSH  Once,   R     12/14/18 1406   12/14/18 1406  Brain natriuretic peptide  Once,   R     12/14/18 1406   12/14/18 1405  HIV antibody (Routine Testing)  Once,   R     12/14/18 1406   12/14/18 1405  CBC  (enoxaparin (LOVENOX)    CrCl >/= 30 ml/min)  Once,   R    Comments:  Baseline for enoxaparin therapy IF NOT ALREADY DRAWN.  Notify MD if PLT < 100 K.    12/14/18 1406   12/14/18 1405  Creatinine, serum  (enoxaparin (LOVENOX)    CrCl >/= 30 ml/min)  Once,   R    Comments:  Baseline for enoxaparin therapy IF NOT ALREADY DRAWN.    12/14/18 1406          Vitals/Pain Today's Vitals   12/14/18 1245 12/14/18 1315 12/14/18 1330 12/14/18 1400  BP: (!) 148/85 (!) 158/93 (!) 164/94 (!) 143/85  Pulse: (!) 106 (!) 110 (!) 110 (!) 114  Resp: 13 14 12 11   Temp:      TempSrc:      SpO2: 98% 99% 98% 98%  PainSc:        Isolation Precautions No active isolations  Medications Medications  atorvastatin (LIPITOR) tablet 80 mg (has no administration in  time range)  aspirin EC tablet 81 mg (has no administration in time range)  traMADol (ULTRAM) tablet 50 mg (has no administration in time range)  isosorbide mononitrate (IMDUR) 24 hr tablet 30 mg (has no administration in time range)  lisinopril (PRINIVIL,ZESTRIL) tablet 10 mg (has no administration in time range)  metoprolol tartrate (LOPRESSOR) tablet 25 mg (has no administration in time range)  nitroGLYCERIN (NITROSTAT) SL tablet 0.4 mg (has no administration in time range)  traZODone (DESYREL) tablet 100 mg (has no administration in time range)  metFORMIN (GLUCOPHAGE-XR) 24 hr tablet 1,000 mg (has no administration in time range)  meclizine (ANTIVERT) tablet 25 mg (has no administration in time range)  tamsulosin (FLOMAX) capsule 0.4 mg (has no administration in time range)  clonazePAM (KLONOPIN) tablet 0.5 mg (has no administration in time range)  gabapentin (NEURONTIN) capsule 100 mg (has no administration in time range)  multivitamin with minerals tablet 1 tablet (has no administration in time range)  loratadine (CLARITIN) tablet 10 mg (has no administration in time range)  fluticasone (FLONASE) 50 MCG/ACT nasal spray 2 spray (has no administration in time range)  mometasone-formoterol (DULERA) 200-5 MCG/ACT inhaler 2 puff (has no administration in time range)  montelukast (SINGULAIR) tablet 10 mg (has no administration in time range)  enoxaparin (LOVENOX) injection 40 mg (has no administration in time range)  acetaminophen (TYLENOL) tablet 650 mg (has no administration in time range)    Or  acetaminophen (TYLENOL) suppository 650 mg (has no administration  in time range)  ondansetron (ZOFRAN) tablet 4 mg (has no administration in time range)    Or  ondansetron (ZOFRAN) injection 4 mg (has no administration in time range)  albuterol (PROVENTIL HFA;VENTOLIN HFA) 108 (90 Base) MCG/ACT inhaler 2 puff (has no administration in time range)    Mobility walks Low fall risk   Focused  Assessments Cardiac Assessment Handoff:  Cardiac Rhythm: Sinus tachycardia, Bundle branch block Lab Results  Component Value Date   CKTOTAL 196 04/05/2012   CKMB 2.1 04/05/2012   TROPONINI <0.03 12/14/2018   Lab Results  Component Value Date   DDIMER 0.43 06/27/2018   Does the Patient currently have chest pain? Yes     R Recommendations: See Admitting Provider Note  Report given to:   Additional Notes:

## 2018-12-15 ENCOUNTER — Observation Stay (HOSPITAL_BASED_OUTPATIENT_CLINIC_OR_DEPARTMENT_OTHER): Payer: Medicare Other

## 2018-12-15 DIAGNOSIS — R079 Chest pain, unspecified: Secondary | ICD-10-CM | POA: Diagnosis not present

## 2018-12-15 DIAGNOSIS — R0789 Other chest pain: Secondary | ICD-10-CM | POA: Diagnosis not present

## 2018-12-15 LAB — CBC
HCT: 39.4 % (ref 39.0–52.0)
Hemoglobin: 12.8 g/dL — ABNORMAL LOW (ref 13.0–17.0)
MCH: 25.8 pg — ABNORMAL LOW (ref 26.0–34.0)
MCHC: 32.5 g/dL (ref 30.0–36.0)
MCV: 79.4 fL — ABNORMAL LOW (ref 80.0–100.0)
Platelets: 261 10*3/uL (ref 150–400)
RBC: 4.96 MIL/uL (ref 4.22–5.81)
RDW: 15.9 % — ABNORMAL HIGH (ref 11.5–15.5)
WBC: 7.5 10*3/uL (ref 4.0–10.5)
nRBC: 0 % (ref 0.0–0.2)

## 2018-12-15 LAB — BASIC METABOLIC PANEL
Anion gap: 11 (ref 5–15)
BUN: 10 mg/dL (ref 6–20)
CO2: 23 mmol/L (ref 22–32)
Calcium: 8.9 mg/dL (ref 8.9–10.3)
Chloride: 104 mmol/L (ref 98–111)
Creatinine, Ser: 0.86 mg/dL (ref 0.61–1.24)
GFR calc Af Amer: >60 mL/min (ref >60)
GFR calc non Af Amer: >60 mL/min (ref >60)
Glucose, Bld: 96 mg/dL (ref 70–99)
Potassium: 3.8 mmol/L (ref 3.5–5.1)
Sodium: 138 mmol/L (ref 135–145)

## 2018-12-15 LAB — GLUCOSE, CAPILLARY
Glucose-Capillary: 99 mg/dL (ref 70–99)
Glucose-Capillary: 97 mg/dL (ref 70–99)

## 2018-12-15 LAB — ECHOCARDIOGRAM LIMITED
Height: 68 in
Weight: 3120 oz

## 2018-12-15 LAB — HIV ANTIBODY (ROUTINE TESTING W REFLEX): HIV Screen 4th Generation wRfx: NONREACTIVE

## 2018-12-15 LAB — TROPONIN I: Troponin I: <0.03 ng/mL (ref <0.03)

## 2018-12-15 LAB — HEMOGLOBIN A1C
Hgb A1c MFr Bld: 6 % — ABNORMAL HIGH (ref 4.8–5.6)
Mean Plasma Glucose: 125.5 mg/dL

## 2018-12-15 LAB — OCCULT BLOOD X 1 CARD TO LAB, STOOL: Fecal Occult Bld: NEGATIVE

## 2018-12-15 MED ORDER — ISOSORBIDE MONONITRATE ER 30 MG PO TB24: 15.0000 mg | ORAL_TABLET | Freq: Every day | ORAL | 0 refills | Status: DC

## 2018-12-15 MED ORDER — LISINOPRIL 5 MG PO TABS: 5.0000 mg | ORAL_TABLET | Freq: Every day | ORAL | 0 refills | Status: DC

## 2018-12-15 MED ORDER — METOPROLOL TARTRATE 25 MG PO TABS: 25.0000 mg | ORAL_TABLET | Freq: Two times a day (BID) | ORAL | Status: DC

## 2018-12-15 MED ORDER — ATORVASTATIN CALCIUM 80 MG PO TABS: 80.0000 mg | ORAL_TABLET | Freq: Every day | ORAL | 0 refills | Status: DC

## 2018-12-15 MED ORDER — METOPROLOL TARTRATE 25 MG PO TABS: 25.0000 mg | ORAL_TABLET | Freq: Two times a day (BID) | ORAL | 0 refills | Status: DC

## 2018-12-15 MED ORDER — METOPROLOL TARTRATE 25 MG PO TABS: 50.0000 mg | ORAL_TABLET | Freq: Two times a day (BID) | ORAL | 0 refills | Status: DC

## 2018-12-15 MED ORDER — METOPROLOL TARTRATE 50 MG PO TABS: 50.0000 mg | ORAL_TABLET | Freq: Two times a day (BID) | ORAL | Status: DC

## 2018-12-15 MED ORDER — ASPIRIN 81 MG PO TBEC: 81.0000 mg | DELAYED_RELEASE_TABLET | Freq: Every day | ORAL | 0 refills | Status: AC

## 2018-12-15 MED ORDER — ISOSORBIDE MONONITRATE ER 30 MG PO TB24: 30.0000 mg | ORAL_TABLET | Freq: Every day | ORAL | 0 refills | Status: DC

## 2018-12-15 NOTE — Discharge Summary (Signed)
Physician Discharge Summary  Cody Raymond:[id number] DOB: Apr 10, 1971 DOA: 12/14/2018  PCP: [name], NP  Admit date: 12/14/2018 Discharge date: 12/15/2018  Admitted From: Home Disposition:  Home  Recommendations for Outpatient Follow-up:  1. Follow up with PCP in 1 week 2. Follow up with Cardiology as soon as able  Discharge Condition: Stable CODE STATUS: Full Diet recommendation: Heart healthy   Brief/Interim Summary: From H&P by Dr. Earnest Conroy: Cody Raymond is a 48 y.o. male with history h/o hypertension, hyperlipidemia, asthma, nonobstructive CAD per cath 2011, diabetes mellitus on oral hypoglycemics, GERD and anxiety presents with complaints of chest pain episodes.  Patient has several antihypertensives/antihyperlipidemics listed on his home medication list but he states he is only been taking lisinopril 10 mg daily and atorvastatin 80 mg daily in addition to psych medications.  He was previously prescribed Imdur but stopped taking.  He states he was advised to take metoprolol (not on scheduled dosing) before getting CT coronary plan by cardiology.  Patient reports completing a two-week course of doxycycline and prednisone therapy for acute sinusitis last week. This morning he was sitting in a chair watching TV when he had sudden onset of left parasternal chest pain, stabbing, 10/10, radiating to jaw and left shoulder associated with palpitations, "fire like sensation in the back of my head and my entire body", nausea and sweating.  On EMS arrival his blood pressure was elevated and blood glucose was at 247.  Patient states he checks his blood pressure twice a day and noticed his systolic blood pressure was elevated and 140s to 150s since yesterday.  Patient apparently did not want to come to the ED to avoid CoVid exposure, hence EMS left.  He however had recurrence of chest pain and EMS this time brought him to the ED.  He received sublingual nitro x2 and aspirin x2 through  both episodes with minimal relief.  He states he feels better when he lays down but has recurrence of chest pain with minimal exertion. Work-up in the ED revealed normal EKG with no acute ST-T changes and troponin x1-ve.  CT head negative for any acute issues.He is requested to be admitted for further evaluation and management.  Patient's blood pressure currently is elevated at 154/110 on the monitor and he has been tachycardic with heart rate in the 120s in the ED. He rates current chest pain at 5/10.  No headache currently denies any tingling or numbness in extremities.He denies any history of smoking alcohol or drug abuse.He does have family history of heart disease in his mother and father apparently passed away with cerebral aneurysmal bleed in his 74s.  Patient does appear anxious and states he was supposed to start Cymbalta prescribed by his PCP which he has not started yet.  Interim: Case was discussed by Dr. Earnest Conroy with Dr. Stanford Breed covering for cardiology. Advised to observe, trend cardiac enzymes, echo to rule out wall motion abnormalities. Patient to follow up outpatient for stress test vs cardiac CT. Trop x 4 have been negative. Echo with normal EF without wall motion abnl reported. Patient resumed on his metoprolol, imdur for discharge home.    Discharge Diagnoses:  Principal Problem:   Chest pain Active Problems:   Type 2 diabetes, controlled, with peripheral neuropathy (HCC)   GERD   CAD (coronary artery disease), non obstructive on cath 2011   Depression with anxiety   HTN (hypertension)   Hyperlipidemia   Discharge Instructions  Discharge Instructions    Call MD for:  difficulty  breathing, headache or visual disturbances   Complete by:  As directed    Call MD for:  extreme fatigue   Complete by:  As directed    Call MD for:  persistant dizziness or light-headedness   Complete by:  As directed    Call MD for:  persistant nausea and vomiting   Complete by:  As directed     Call MD for:  severe uncontrolled pain   Complete by:  As directed    Diet - low sodium heart healthy   Complete by:  As directed    Discharge instructions   Complete by:  As directed    You were cared for by a hospitalist during your hospital stay. If you have any questions about your discharge medications or the care you received while you were in the hospital after you are discharged, you can call the unit and ask to speak with the hospitalist on call if the hospitalist that took care of you is not available. Once you are discharged, your primary care physician will handle any further medical issues. Please note that NO REFILLS for any discharge medications will be authorized once you are discharged, as it is imperative that you return to your primary care physician (or establish a relationship with a primary care physician if you do not have one) for your aftercare needs so that they can reassess your need for medications and monitor your lab values.   Increase activity slowly   Complete by:  As directed      Allergies as of 12/15/2018      Reactions   Benadryl [diphenhydramine Hcl]    "drives me nuts"      Medication List    STOP taking these medications   amoxicillin-clavulanate 875-125 MG tablet Commonly known as:  AUGMENTIN   aspirin 81 MG tablet Replaced by:  aspirin 81 MG EC tablet   doxycycline 100 MG tablet Commonly known as:  VIBRA-TABS   ezetimibe 10 MG tablet Commonly known as:  ZETIA   fenofibrate 145 MG tablet Commonly known as:  TRICOR   Fluticasone-Salmeterol 250-50 MCG/DOSE Aepb Commonly known as:  Wixela Inhub   gabapentin 300 MG capsule Commonly known as:  NEURONTIN   HYDROcodone-homatropine 5-1.5 MG/5ML syrup Commonly known as:  HYCODAN   loperamide 2 MG capsule Commonly known as:  IMODIUM   meloxicam 7.5 MG tablet Commonly known as:  MOBIC   predniSONE 10 MG tablet Commonly known as:  DELTASONE   sertraline 100 MG tablet Commonly known  as:  ZOLOFT     TAKE these medications   albuterol (2.5 MG/3ML) 0.083% nebulizer solution Commonly known as:  PROVENTIL USE 1 VIAL VIA NEBULIZER  EVERY 6 HOURS AS NEEDED FOR WHEEZING OR SHORTNESS OF  BREATH What changed:    See the new instructions.  Another medication with the same name was removed. Continue taking this medication, and follow the directions you see here.   aspirin 81 MG EC tablet Take 1 tablet (81 mg total) by mouth daily. Start taking on:  December 16, 2018 Replaces:  aspirin 81 MG tablet   atorvastatin 80 MG tablet Commonly known as:  LIPITOR Take 1 tablet (80 mg total) by mouth daily at 6 PM. What changed:    when to take this  Another medication with the same name was removed. Continue taking this medication, and follow the directions you see here.   bismuth subsalicylate 992 EQ/68TM suspension Commonly known as:  PEPTO BISMOL Take 30  mLs by mouth every 6 (six) hours as needed for indigestion.   budesonide 0.5 MG/2ML nebulizer solution Commonly known as:  PULMICORT Inhale 2 mLs into the lungs every 6 (six) hours as needed for shortness of breath.   cetirizine 10 MG tablet Commonly known as:  ZYRTEC Take 10 mg by mouth daily.   clonazePAM 1 MG tablet Commonly known as:  KLONOPIN TAKE 1 TABLET BY MOUTH  DAILY   diclofenac sodium 1 % Gel Commonly known as:  VOLTAREN Apply 2 g topically 4 (four) times daily as needed (pain).   DULoxetine 30 MG capsule Commonly known as:  CYMBALTA Take 30 mg by mouth daily.   fluticasone 50 MCG/ACT nasal spray Commonly known as:  FLONASE Place 2 sprays into both nostrils as needed for allergies or rhinitis.   isosorbide mononitrate 30 MG 24 hr tablet Commonly known as:  Imdur Take 0.5 tablets (15 mg total) by mouth daily. Start taking on:  December 16, 2018 What changed:  how much to take   ketoconazole 2 % shampoo Commonly known as:  NIZORAL APPLY TOPICALLY DAILY AS  DIRECTED What changed:  See the new  instructions.   lisinopril 5 MG tablet Commonly known as:  PRINIVIL,ZESTRIL Take 1 tablet (5 mg total) by mouth daily. What changed:    medication strength  how much to take   meclizine 25 MG tablet Commonly known as:  ANTIVERT Take 1 tablet (25 mg total) by mouth 3 (three) times daily as needed for dizziness.   metFORMIN 500 MG 24 hr tablet Commonly known as:  GLUCOPHAGE-XR Take 1,000 mg by mouth daily. What changed:  Another medication with the same name was removed. Continue taking this medication, and follow the directions you see here.   metoprolol tartrate 25 MG tablet Commonly known as:  LOPRESSOR Take 2 tablets (50 mg total) by mouth 2 (two) times daily. What changed:    medication strength  how much to take  how to take this  when to take this  additional instructions  Another medication with the same name was removed. Continue taking this medication, and follow the directions you see here.   montelukast 10 MG tablet Commonly known as:  SINGULAIR Take 10 mg by mouth at bedtime. What changed:  Another medication with the same name was removed. Continue taking this medication, and follow the directions you see here.   multivitamin with minerals Tabs tablet Take 1 tablet by mouth daily.   nitroGLYCERIN 0.4 MG SL tablet Commonly known as:  NITROSTAT Place 1 tablet (0.4 mg total) under the tongue every 5 (five) minutes as needed for chest pain.   OneTouch Verio test strip Generic drug:  glucose blood USE WITH METER TO CHECK  BLOOD SUGAR 3 TIMES DAILY   tamsulosin 0.4 MG Caps capsule Commonly known as:  FLOMAX TAKE 1 CAPSULE BY MOUTH  DAILY   traMADol 50 MG tablet Commonly known as:  ULTRAM TAKE 1 TABLET BY MOUTH  EVERY 6 HOURS AS NEEDED FOR SEVERE PAIN What changed:    how much to take  how to take this  when to take this  reasons to take this   traZODone 100 MG tablet Commonly known as:  DESYREL Take 100 mg by mouth at bedtime.   vitamin  C 1000 MG tablet Take 1,000 mg by mouth daily.      Follow-up Information    [name], NP Follow up.   Specialty:  Internal Medicine Contact information: [address] [place] [address] [phone]        Pixie Casino, MD. Schedule an appointment as soon as possible for a visit.   Specialty:  Cardiology Contact information: [place] [address] [phone]          Allergies  Allergen Reactions  . Benadryl [Diphenhydramine Hcl]     "drives me nuts"    Consultations:  Cardiology over the phone    Procedures/Studies: Dg Chest 2 View  Result Date: 12/14/2018 CLINICAL DATA:  Chest pain EXAM: CHEST - 2 VIEW COMPARISON:  November 26, 2018 FINDINGS: There is no appreciable edema or consolidation. Heart size and pulmonary vascularity are normal. No adenopathy. There is an azygos lobe on the right, an anatomic variant. No appreciable bone lesions. No pneumothorax. IMPRESSION: No edema or consolidation. Electronically Signed   By: Lowella Grip III M.D.   On: 12/14/2018 12:11   Dg Chest 2 View  Result Date: 11/26/2018 CLINICAL DATA:  Cough and congestion for the past month. EXAM: CHEST - 2 VIEW COMPARISON:  Chest x-ray dated October 06, 2018. FINDINGS: The heart size and mediastinal contours are within normal limits. Normal pulmonary vascularity. No focal consolidation, pleural effusion, or pneumothorax. No acute osseous abnormality. Unchanged old left-sided lateral rib fractures. IMPRESSION: No active cardiopulmonary disease. Electronically Signed   By: Titus Dubin M.D.   On: 11/26/2018 11:05   Ct Head Wo Contrast  Result Date: 12/14/2018 CLINICAL DATA:  Acute onset chest pain and headache today. EXAM: CT HEAD WITHOUT CONTRAST TECHNIQUE: Contiguous axial images were obtained from the base of the skull through the vertex without intravenous contrast. COMPARISON:  Head CT 10/26/2018 and 06/17/2018. FINDINGS:  Brain: No evidence of acute infarction, hemorrhage, hydrocephalus, extra-axial collection or mass lesion/mass effect. Vascular: No hyperdense vessel or unexpected calcification. Skull: Intact.  No fracture or focal lesion. Sinuses/Orbits: Mucous retention cyst or polyp left maxillary sinus and mild mucosal thickening right sphenoid sinus noted. Other: None. IMPRESSION: No acute intracranial abnormality. Mucous retention cyst or polyp left maxillary sinus. Mild mucosal thickening right sphenoid sinus also noted. Electronically Signed   By: Inge Rise M.D.   On: 12/14/2018 12:48    Echo IMPRESSIONS  1. The left ventricle has normal systolic function with an ejection fraction of 60-65%. The cavity size was normal. Left ventricular diastolic function could not be evaluated due to indeterminent diastolic function.  2. The right ventricle has normal systolic function. The cavity was normal. There is no increase in right ventricular wall thickness. Right ventricular systolic pressure could not be assessed.  3. No evidence present in the left atrial appendage.  4. No pulmonic valve vegetation visualized.   Discharge Exam: Vitals:   12/15/18 0900 12/15/18 1109  BP:  96/70  Pulse:  89  Resp:  18  Temp:  98 F (36.7 C)  SpO2: 99% 97%    General: Pt is alert, awake, not in acute distress Cardiovascular: RRR, S1/S2 +, no rubs, no gallops Respiratory: CTA bilaterally, no wheezing, no rhonchi Abdominal: Soft, NT, ND, bowel sounds + Extremities: no edema, no cyanosis    The results of significant diagnostics from this hospitalization (including imaging, microbiology, ancillary and laboratory) are listed below for reference.     Microbiology: Recent Results (from the past 240 hour(s))  MRSA PCR Screening     Status: None   Collection Time: 12/14/18  4:13 PM  Result Value Ref Range Status   MRSA by PCR NEGATIVE  NEGATIVE Final    Comment:        The GeneXpert MRSA Assay (FDA approved for  NASAL specimens only), is one component of a comprehensive MRSA colonization surveillance program. It is not intended to diagnose MRSA infection nor to guide or monitor treatment for MRSA infections. Performed at [facility], [place] [address], [place], [place] [address]      Labs: BNP (last 3 results) Recent Labs    12/14/18 1406  BNP 27.0   Basic Metabolic Panel: Recent Labs  Lab 12/14/18 1143 12/14/18 1405 12/15/18 0355  NA 134*  --  138  K 3.6  --  3.8  CL 102  --  104  CO2 20*  --  23  GLUCOSE 131*  --  96  BUN 12  --  10  CREATININE 0.76 0.79 0.86  CALCIUM 8.9  --  8.9   Liver Function Tests: No results for input(s): AST, ALT, ALKPHOS, BILITOT, PROT, ALBUMIN in the last 168 hours. No results for input(s): LIPASE, AMYLASE in the last 168 hours. No results for input(s): AMMONIA in the last 168 hours. CBC: Recent Labs  Lab 12/14/18 1143 12/14/18 1405 12/15/18 0355  WBC 10.7* 11.1* 7.5  HGB 13.2 14.5 12.8*  HCT 41.9 44.0 39.4  MCV 82.5 80.7 79.4*  PLT 263 311 261   Cardiac Enzymes: Recent Labs  Lab 12/14/18 1143 12/14/18 1628 12/14/18 2149 12/15/18 0355  TROPONINI <0.03 <0.03 <0.03 <0.03   BNP: Invalid input(s): POCBNP CBG: Recent Labs  Lab 12/14/18 1810 12/14/18 2129 12/15/18 0627 12/15/18 1108  GLUCAP 142* 152* 99 97   D-Dimer No results for input(s): DDIMER in the last 72 hours. Hgb A1c Recent Labs    12/15/18 0355  HGBA1C 6.0*   Lipid Profile No results for input(s): CHOL, HDL, LDLCALC, TRIG, CHOLHDL, LDLDIRECT in the last 72 hours. Thyroid function studies Recent Labs    12/14/18 1406  TSH 0.805   Anemia work up No results for input(s): VITAMINB12, FOLATE, FERRITIN, TIBC, IRON, RETICCTPCT in the last 72 hours. Urinalysis    Component Value Date/Time   COLORURINE YELLOW 10/26/2018 2310   APPEARANCEUR CLEAR 10/26/2018 2310   LABSPEC 1.045 (H) 10/26/2018 2310   PHURINE 7.0 10/26/2018 2310   GLUCOSEU NEGATIVE  10/26/2018 2310   GLUCOSEU NEGATIVE 07/25/2017 0843   HGBUR NEGATIVE 10/26/2018 2310   BILIRUBINUR NEGATIVE 10/26/2018 2310   BILIRUBINUR negative 01/16/2017 1427   KETONESUR NEGATIVE 10/26/2018 2310   PROTEINUR NEGATIVE 10/26/2018 2310   UROBILINOGEN 0.2 07/25/2017 0843   NITRITE NEGATIVE 10/26/2018 2310   LEUKOCYTESUR NEGATIVE 10/26/2018 2310   Sepsis Labs Invalid input(s): PROCALCITONIN,  WBC,  LACTICIDVEN Microbiology Recent Results (from the past 240 hour(s))  MRSA PCR Screening     Status: None   Collection Time: 12/14/18  4:13 PM  Result Value Ref Range Status   MRSA by PCR NEGATIVE NEGATIVE Final    Comment:        The GeneXpert MRSA Assay (FDA approved for NASAL specimens only), is one component of a comprehensive MRSA colonization surveillance program. It is not intended to diagnose MRSA infection nor to guide or monitor treatment for MRSA infections. Performed at [facility], [place] [address], [place], [place] [address]      Patient was seen and examined on the day of discharge and was found to be in stable condition. Time coordinating discharge: 40 minutes including assessment and coordination of care, as well as examination of the patient.  SIGNED:  Dessa Phi, DO [facility] [email or web address] 12/15/2018, 1:27 PM

## 2018-12-15 NOTE — Progress Notes (Signed)
  Echocardiogram 2D Echocardiogram has been performed.  Cody Raymond 12/15/2018, 11:57 AM

## 2018-12-15 NOTE — Progress Notes (Signed)
Patient states he has had dark stool for several days.  Pt w/BM this AM - MD notified and new orders received for hemoccult.  Sample sent to lab.

## 2018-12-16 ENCOUNTER — Telehealth: Payer: Self-pay | Admitting: Family

## 2018-12-16 NOTE — Telephone Encounter (Signed)
Please call pt to arrange a 1 week follow up virtual visit.

## 2018-12-17 ENCOUNTER — Telehealth: Payer: Self-pay

## 2018-12-17 ENCOUNTER — Telehealth: Payer: Self-pay | Admitting: Internal Medicine

## 2018-12-17 NOTE — Telephone Encounter (Signed)
Virtual Visit Pre-Appointment Phone Call  Cody Raymond has been deemed a candidate for a follow-up tele-health visit to limit community exposure during the Covid-19 pandemic. I spoke with the patient via phone to ensure availability of phone/video source, confirm preferred email & phone number, and discuss instructions and expectations.  I reminded Cody Raymond to be prepared with any vital sign and/or heart rhythm information that could potentially be obtained via home monitoring, at the time of his visit. I reminded [name] to expect a phone call at the time of his visit if his visit.  Did the patient verbally acknowledge consent to treatment? Patient provided verbal consent.  Cody Raymond, [place] 12/17/2018 5:31 PM   DOWNLOADING THE [place]  - If Apple, go to [employer] and type in WebEx in the search bar. [place] [employer], the blue/green circle. The app is free but as with any other app downloads, their phone may require them to verify saved payment information or Apple password. The patient does NOT have to create an account.  - If Android, ask patient to go to [employer] and type in WebEx in the search bar. [place] [employer], the blue/green circle. The app is free but as with any other app downloads, their phone may require them to verify saved payment information or Android password. The patient does NOT have to create an account.   CONSENT FOR TELE-HEALTH VISIT - PLEASE REVIEW  I hereby voluntarily request, consent and authorize [facility] and its employed or contracted physicians, physician assistants, nurse practitioners or other licensed health care professionals (the Practitioner), to provide me with telemedicine health care services (the "Services") as deemed necessary by the treating Practitioner. I acknowledge and consent to receive the Services by the Practitioner via telemedicine. I understand that the  telemedicine visit will involve communicating with the Practitioner through live audiovisual communication technology and the disclosure of certain medical information by electronic transmission. I acknowledge that I have been given the opportunity to request an in-person assessment or other available alternative prior to the telemedicine visit and am voluntarily participating in the telemedicine visit.  I understand that I have the right to withhold or withdraw my consent to the use of telemedicine in the course of my care at any time, without affecting my right to future care or treatment, and that the Practitioner or I may terminate the telemedicine visit at any time. I understand that I have the right to inspect all information obtained and/or recorded in the course of the telemedicine visit and may receive copies of available information for a reasonable fee.  I understand that some of the potential risks of receiving the Services via telemedicine include:  Marland Kitchen Delay or interruption in medical evaluation due to technological equipment failure or disruption; . Information transmitted may not be sufficient (e.g. poor resolution of images) to allow for appropriate medical decision making by the Practitioner; and/or  . In rare instances, security protocols could fail, causing a breach of personal health information.  Furthermore, I acknowledge that it is my responsibility to provide information about my medical history, conditions and care that is complete and accurate to the best of my ability. I acknowledge that Practitioner's advice, recommendations, and/or decision may be based on factors not within their control, such as incomplete or inaccurate data provided by me or distortions of diagnostic images or specimens that may result from electronic transmissions. I understand that the practice of medicine is  not an Chief Strategy Officer and that Practitioner makes no warranties or guarantees regarding treatment  outcomes. I acknowledge that I will receive a copy of this consent concurrently upon execution via email to the email address I last provided but may also request a printed copy by calling the office of [facility].    I understand that my insurance will be billed for this visit.   I have read or had this consent read to me. . I understand the contents of this consent, which adequately explains the benefits and risks of the Services being provided via telemedicine.  . I have been provided ample opportunity to ask questions regarding this consent and the Services and have had my questions answered to my satisfaction. . I give my informed consent for the services to be provided through the use of telemedicine in my medical care  By participating in this telemedicine visit I agree to the above.

## 2018-12-17 NOTE — Telephone Encounter (Signed)
Scheduled for friday

## 2018-12-17 NOTE — Telephone Encounter (Signed)
New Message:     Pt wants to know what he need to do about his appt on Wednesday(12-19-18)?

## 2018-12-17 NOTE — Telephone Encounter (Signed)
12/17/18  Transition Care Management Follow-up Telephone Call  ADMISSION DATE: 12/14/18  DISCHARGE DATE: 12/15/18   How have you been since you were released from the hospital? Patient states BP and FBS have been elevated.   Do you understand why you were in the hospital? Yes   Do you understand the discharge instrcutions? Yes    Items Reviewed:  Medications reviewed: Yes   Allergies reviewed: Yes   Dietary changes reviewed: Low sodium,Heart healthy  Referrals reviewed: Appointment scheduled with PCP  Functional Questionnaire:  Activities of Daily Living (ADLs): Patient states he is able to perform all independently.  Any patient concerns? Pain in lower back and shoulder. Not seen by Cardiologist while in hospital.    Confirmed importance and date/time of follow-up visits scheduled: Yes  Confirmed with patient if condition begins to worsen call PCP or go to the ER. Yes   Patient was given the office number and encouragred to call back with questions or concerns.Yes

## 2018-12-19 ENCOUNTER — Telehealth (INDEPENDENT_AMBULATORY_CARE_PROVIDER_SITE_OTHER): Payer: Medicare Other | Admitting: Internal Medicine

## 2018-12-19 ENCOUNTER — Encounter: Payer: Self-pay | Admitting: Internal Medicine

## 2018-12-19 ENCOUNTER — Ambulatory Visit: Payer: Medicare Other | Admitting: Internal Medicine

## 2018-12-19 VITALS — BP 119/80 | HR 81

## 2018-12-19 DIAGNOSIS — I712 Thoracic aortic aneurysm, without rupture, unspecified: Secondary | ICD-10-CM

## 2018-12-19 DIAGNOSIS — Z8774 Personal history of (corrected) congenital malformations of heart and circulatory system: Secondary | ICD-10-CM | POA: Diagnosis not present

## 2018-12-19 DIAGNOSIS — I2510 Atherosclerotic heart disease of native coronary artery without angina pectoris: Secondary | ICD-10-CM | POA: Diagnosis not present

## 2018-12-19 DIAGNOSIS — E1142 Type 2 diabetes mellitus with diabetic polyneuropathy: Secondary | ICD-10-CM

## 2018-12-19 DIAGNOSIS — E785 Hyperlipidemia, unspecified: Secondary | ICD-10-CM

## 2018-12-19 MED ORDER — ICOSAPENT ETHYL 1 G PO CAPS: 2.0000 g | ORAL_CAPSULE | Freq: Two times a day (BID) | ORAL | 6 refills | Status: DC

## 2018-12-19 MED ORDER — ICOSAPENT ETHYL 1 G PO CAPS: 2.0000 g | ORAL_CAPSULE | Freq: Two times a day (BID) | ORAL | 0 refills | Status: DC

## 2018-12-19 NOTE — Addendum Note (Signed)
Addended by: Diana Eves on: 12/19/2018 09:26 AM   Modules accepted: Orders

## 2018-12-19 NOTE — Patient Instructions (Signed)
Medication Instructions:  Dr Debara Pickett has recommended making the following medication changes: 1. START Vascepa 1 G capsules - take 2 capsules twice daily  If you need a refill on your cardiac medications before your next appointment, please call your pharmacy.   Lab work: Your physician recommends that you return for lab work in 4 months - FASTING (nothing to eat after midnight). If our [place] office is convenient for you, we have a lab in the office. You do not need an appointment to have your lab work completed there. There is a sign in sheet to the right of our check-in desk.  If you have labs (blood work) drawn today and your tests are completely normal, you will receive your results only by: Marland Kitchen MyChart Message (if you have MyChart) OR . A paper copy in the mail If you have any lab test that is abnormal or we need to change your treatment, we will call you to review the results.  Testing/Procedures:  Follow-Up: At [facility], you and your health needs are our priority.  As part of our continuing mission to provide you with exceptional heart care, we have created designated Provider Care Teams.  These Care Teams include your primary Cardiologist (physician) and Advanced Practice Providers (APPs -  Physician Assistants and Nurse Practitioners) who all work together to provide you with the care you need, when you need it. You will need a follow up appointment in 4 months with Dr Debara Pickett in the lipid clinic.

## 2018-12-19 NOTE — Progress Notes (Signed)
Virtual Visit via Video Note    Evaluation Performed:  New lipid clinic referral  This visit type was conducted due to national recommendations for restrictions regarding the COVID-19 Pandemic (e.g. social distancing).  This format is felt to be most appropriate for this patient at this time.  All issues noted in this document were discussed and addressed.  No physical exam was performed (except for noted visual exam findings with Video Visits).  Please refer to the patient's chart (MyChart message for video visits and phone note for telephone visits) for the patient's consent to telehealth for [facility].  Date:  12/19/2018   ID:  Cody Raymond, DOB 1971-07-29, MRN [id number]  Patient Location:  [place] [address]  Provider location:   [address] [place], [place] [address]  PCP:  Cody Alar, NP  Cardiologist:  No primary care provider on file. Electrophysiologist:  None   Chief Complaint:  Manage dyslipidemia  History of Present Illness:    Cody Raymond is a 48 y.o. male who presents via audio/video conferencing for a telehealth visit today.  This is a pleasant 48 year old male with a history of coronary artery disease which was mild and nonobstructive by cath in 2011, type 2 diabetes, aneurysm, hypertension, and marked dyslipidemia with typical diabetic pattern of high triglycerides, elevated LDL and low HDL.  He also has a history of congenital heart disease with PDA repair at age 21.  He is a Restaurant manager, fast food.  There is a family history of heart disease in his mother who had coronary artery bypass.  Unfortunately, he has frequent episodes of chest pain.  Recently he had chest pain and was directed to the emergency department a couple weeks ago.  Work-up was negative for ischemia and underwent an echo which showed normal LV function and no wall motion abnormalities.  He now says the chest discomfort has resolved.  He also has a  history of syncope in the past and had a loop recorder implanted and explanted without any significant findings.  He was referred to me today for lipid management.  Profile was 4 months ago which showed total cholesterol 195, triglycerides 347, HDL 43 and a dLDL of 119.  This is on atorvastatin 80 mg daily.  He does report compliance with this medication.  He is not currently on any other therapies for lipid lowering.  As for diet, he reports trialing a low carbohydrate diet for his diabetes and generally tries to avoid saturated fats as much as possible.  The patient does not have symptoms concerning for COVID-19 infection (fever, chills, cough, or new SHORTNESS OF BREATH).    Prior CV studies:   The following studies were reviewed today:  Echocardiogram 12/15/2018  PMHx:  Past Medical History:  Diagnosis Date   Aneurysm ([place])    Asthma    Blood transfusion    CAD (coronary artery disease), non obstructive on cath 2011 04/05/2012   Coronary artery disease    Diabetes mellitus    Diabetes mellitus without complication ([place])    Enlarged prostate    GERD (gastroesophageal reflux disease)    H/O syncope    Heart disease    History of repair of patent ductus arteriosus 07/23/2016   Hyperlipidemia    Hypertension    Neuromuscular disorder ([place])    DJD   Refusal of blood transfusions as patient is Jehovah's Witness     Past Surgical History:  Procedure Laterality Date   APPENDECTOMY  BACK SURGERY     CARDIAC CATHETERIZATION  2007   Clean Cardiac Cath ([place]), with RCA 30% narrowing likely due to catheter induced spasm in 09/02/10.   CARDIAC CATHETERIZATION  09/02/2010   mod. nonobstructive disease in the RCA and CX, tortuous LAD   COLONOSCOPY W/ POLYPECTOMY  03/17/11   diminutive polyp   LOOP RECORDER EXPLANT N/A 01/14/2014   Procedure: LOOP RECORDER EXPLANT;  Surgeon: Cody Klein, MD;  Location: [facility] CATH LAB;  Service: Cardiovascular;   Laterality: N/A;   LOOP RECORDER IMPLANT  04/06/2012   Reveal XT 4529   LOOP RECORDER IMPLANT N/A 04/06/2012   Procedure: LOOP RECORDER IMPLANT;  Surgeon: Cody Klein, MD;  Location: [facility] CATH LAB;  Service: Cardiovascular;  Laterality: N/A;   NM MYOCAR PERF WALL MOTION  01/25/2008   mild anteroapical wall ischemia   PATENT DUCTUS ARTERIOUS REPAIR     at age 79    FAMHx:  Family History  Problem Relation Age of Onset   Colon cancer Paternal Grandfather    Prostate cancer Paternal Grandfather    Aneurysm Father    Heart attack Father 5   Coronary artery disease Father    Colon polyps Mother    Heart disease Mother    Coronary artery disease Mother    Migraines Mother    Breast cancer Maternal Grandmother    Colon cancer Paternal Uncle        x 2   Stomach cancer Brother    Liver disease Other        unsure who it was   Allergies Daughter     SOCHx:   reports that he has never smoked. He has never used smokeless tobacco. He reports that he does not drink alcohol or use drugs.  ALLERGIES:  Allergies  Allergen Reactions   Benadryl [Diphenhydramine Hcl]     "drives me nuts"    MEDS:  Current Meds  Medication Sig   albuterol (PROVENTIL) (2.5 MG/3ML) 0.083% nebulizer solution USE 1 VIAL VIA NEBULIZER  EVERY 6 HOURS AS NEEDED FOR WHEEZING OR SHORTNESS OF  BREATH (Patient taking differently: Take 2.5 mg by nebulization every 6 (six) hours as needed for wheezing or shortness of breath. )   Ascorbic Acid (VITAMIN C) 1000 MG tablet Take 1,000 mg by mouth daily.   aspirin EC 81 MG EC tablet Take 1 tablet (81 mg total) by mouth daily.   atorvastatin (LIPITOR) 80 MG tablet Take 1 tablet (80 mg total) by mouth daily at 6 PM.   bismuth subsalicylate (PEPTO BISMOL) 262 MG/15ML suspension Take 30 mLs by mouth every 6 (six) hours as needed for indigestion.   budesonide (PULMICORT) 0.5 MG/2ML nebulizer solution Inhale 2 mLs into the lungs every 6 (six) hours as  needed for shortness of breath.   cetirizine (ZYRTEC) 10 MG tablet Take 10 mg by mouth daily.   clonazePAM (KLONOPIN) 1 MG tablet TAKE 1 TABLET BY MOUTH  DAILY (Patient taking differently: Take 1 mg by mouth daily. )   diclofenac sodium (VOLTAREN) 1 % GEL Apply 2 g topically 4 (four) times daily as needed (pain).   DULoxetine (CYMBALTA) 30 MG capsule Take 30 mg by mouth daily.   fluticasone (FLONASE) 50 MCG/ACT nasal spray Place 2 sprays into both nostrils as needed for allergies or rhinitis.   isosorbide mononitrate (IMDUR) 30 MG 24 hr tablet Take 0.5 tablets (15 mg total) by mouth daily.   ketoconazole (NIZORAL) 2 % shampoo APPLY TOPICALLY DAILY AS  DIRECTED (  Patient taking differently: Apply 1 application topically 3 (three) times a week. )   lisinopril (PRINIVIL,ZESTRIL) 5 MG tablet Take 1 tablet (5 mg total) by mouth daily.   meclizine (ANTIVERT) 25 MG tablet Take 1 tablet (25 mg total) by mouth 3 (three) times daily as needed for dizziness.   metFORMIN (GLUCOPHAGE-XR) 500 MG 24 hr tablet Take 1,000 mg by mouth daily.   metoprolol tartrate (LOPRESSOR) 25 MG tablet Take 2 tablets (50 mg total) by mouth 2 (two) times daily.   montelukast (SINGULAIR) 10 MG tablet Take 10 mg by mouth at bedtime.   Multiple Vitamin (MULTIVITAMIN WITH MINERALS) TABS tablet Take 1 tablet by mouth daily.   nitroGLYCERIN (NITROSTAT) 0.4 MG SL tablet Place 1 tablet (0.4 mg total) under the tongue every 5 (five) minutes as needed for chest pain.   ONETOUCH VERIO test strip USE WITH METER TO CHECK  BLOOD SUGAR 3 TIMES DAILY   tamsulosin (FLOMAX) 0.4 MG CAPS capsule TAKE 1 CAPSULE BY MOUTH  DAILY (Patient taking differently: Take 0.4 mg by mouth daily. )   traMADol (ULTRAM) 50 MG tablet TAKE 1 TABLET BY MOUTH  EVERY 6 HOURS AS NEEDED FOR SEVERE PAIN (Patient taking differently: Take 50 mg by mouth every 6 (six) hours as needed for severe pain. TAKE 1 TABLET BY MOUTH  EVERY 6 HOURS AS NEEDED FOR SEVERE PAIN)    traZODone (DESYREL) 100 MG tablet Take 100 mg by mouth at bedtime.     ROS: Pertinent items noted in HPI and remainder of comprehensive ROS otherwise negative.  Labs/Other Tests and Data Reviewed:    Recent Labs: 10/26/2018: ALT 22 12/14/2018: B Natriuretic Peptide 13.8; TSH 0.805 12/15/2018: BUN 10; Creatinine, Ser 0.86; Hemoglobin 12.8; Platelets 261; Potassium 3.8; Sodium 138   Recent Lipid Panel Lab Results  Component Value Date/Time   CHOL 195 07/31/2018 10:44 AM   TRIG 347.0 (H) 07/31/2018 10:44 AM   HDL 43.70 07/31/2018 10:44 AM   CHOLHDL 4 07/31/2018 10:44 AM   LDLCALC 122 (H) 04/17/2015 09:40 AM   LDLDIRECT 119.0 07/31/2018 10:44 AM    Wt Readings from Last 3 Encounters:  12/14/18 195 lb (88.5 kg)  11/26/18 198 lb (89.8 kg)  10/29/18 196 lb 9.6 oz (89.2 kg)     Exam:    Vital Signs:  BP 119/80    Pulse 81    General appearance: alert and no distress Extremities: extremities normal, atraumatic, no cyanosis or edema Neurologic: Mental status: Alert, oriented, thought content appropriate Psych : Pleasant  ASSESSMENT & PLAN:    1. Mixed dyslipidemia 2. History of nonobstructive coronary artery disease 3. Thoracic aortic aneurysm 4. Congenital heart disease status post PDA ligation 5. Family history of coronary artery disease 6. Essential hypertension 7. Type 2 diabetes - controlled (A1c 6.0)  Mr. Breau has a mixed dyslipidemia with high triglycerides and LDL which is above goal recommendation of less than 70.  He is on high intensity atorvastatin.  Given the recent positive data from the REDUCE-IT trial, I think he is an ideal candidate to add Vascepa 2 g twice daily to his regimen.  His LDL remains elevated and could be additional target for therapy.  We may consider PCSK9 inhibitor as well.  Plan a repeat lipid profile in about 4 months and follow-up with me in lipid clinic.  Thanks as always for the kind referral.  COVID-19 Education: The signs and  symptoms of COVID-19 were discussed with the patient and how to seek care  for testing (follow up with PCP or arrange E-visit).  The importance of social distancing was discussed today.  Patient Risk:   After full review of this patients clinical status, I feel that they are at least moderate risk at this time.  Time:   Today, I have spent 25 minutes with the patient with telehealth technology discussing cardiovascular history, lipid management, the reduce it trial, goal LDL cholesterol, recent hospitalization for chest pain, echocardiographic results.     Medication Adjustments/Labs and Tests Ordered: Current medicines are reviewed at length with the patient today.  Concerns regarding medicines are outlined above.   Tests Ordered: Orders Placed This Encounter  Procedures   Lipid panel   Direct LDL    Medication Changes: Meds ordered this encounter  Medications   Icosapent Ethyl (VASCEPA) 1 g CAPS    Sig: Take 2 capsules (2 g total) by mouth 2 (two) times daily.    Dispense:  120 capsule    Refill:  6    Disposition:  in 4 month(s)  Pixie Casino, MD, [facility], [place] Director of the Advanced Lipid Disorders &  Cardiovascular Risk Reduction Clinic Diplomate of the American Board of Clinical Lipidology Attending Cardiologist  Direct Dial: [phone]   Fax: [phone]  Website:  [email or web address]  Pixie Casino, MD  12/19/2018 8:48 AM

## 2018-12-21 ENCOUNTER — Other Ambulatory Visit: Payer: Self-pay

## 2018-12-21 ENCOUNTER — Ambulatory Visit (INDEPENDENT_AMBULATORY_CARE_PROVIDER_SITE_OTHER): Payer: Medicare Other | Admitting: Family

## 2018-12-21 ENCOUNTER — Encounter: Payer: Self-pay | Admitting: Family

## 2018-12-21 DIAGNOSIS — E1142 Type 2 diabetes mellitus with diabetic polyneuropathy: Secondary | ICD-10-CM | POA: Diagnosis not present

## 2018-12-21 DIAGNOSIS — M797 Fibromyalgia: Secondary | ICD-10-CM | POA: Diagnosis not present

## 2018-12-21 DIAGNOSIS — I10 Essential (primary) hypertension: Secondary | ICD-10-CM

## 2018-12-21 DIAGNOSIS — J309 Allergic rhinitis, unspecified: Secondary | ICD-10-CM | POA: Diagnosis not present

## 2018-12-21 DIAGNOSIS — F418 Other specified anxiety disorders: Secondary | ICD-10-CM

## 2018-12-21 DIAGNOSIS — I2510 Atherosclerotic heart disease of native coronary artery without angina pectoris: Secondary | ICD-10-CM

## 2018-12-21 NOTE — Progress Notes (Signed)
Virtual Visit via Video Note  I connected with Cody Raymond on 12/21/18 at 11:20 AM EDT by a video enabled telemedicine application and verified that I am speaking with the correct person using two identifiers.   I discussed the limitations of evaluation and management by telemedicine and the availability of in person appointments. The patient expressed understanding and agreed to proceed.  Only the patient, his wife and I were on today's video visit. The patient was at home and I was in my office at the time of today's visit.   History of Present Illness:  Patient is a 48 yr old male who presents today for hospital follow up.  He was admitted on 12/14/18-12/15/18 with chief complaint of chest pain left sided chest pain.    Depression/ Anxiety- did not start cymbalta.  Reports that his mood is stable. He continues zoloft.    FM-reports some improvement with gabapentin and   HTN- he was restarted on isosobide and metoprolol.  Reports that he has had some mild chest pain since leaving the hospital but not as severe as before.  Reports that he had a evisit on Wednesday with cardiology and scheduled to see them back in person in July if he remains stable.   Reports bp this am was 90/69. Reports around 120/70 during the day.  No current dizziness.   BP Readings from Last 3 Encounters:  12/19/18 119/80  12/15/18 96/70  11/26/18 112/70   DM2- reports post prandial 150-160, he is taking metformin 1000mg  xr once daily. Sugar was 89 this am.     Allergic rhinitis- using flonase and salt water gargles. Continues zyrtec and singulair.   Continues albuterol nebulizer and recently started budesonide.  This has  Observations/Objective:  Gen: Awake, alert, no acute distress Resp: Breathing is even and non-labored Psych: calm/pleasant demeanor Neuro: Alert and Oriented x 3, + facial symmetry, speech is clear.   Assessment and Plan:  HTN- bp a bit low today but has been higher overall. Plan to  monitor on current meds/doses.  Asthma- stable on current regimen. Continue same. I advised pt to avoid going out in public to decrease risk of COVID-19 exposure.   DM2- clinically stable. Continue current meds.   Anxiety- stable on zoloft, Continue same.  Fibromyalgia- stable on gabapentin and tramadol. Advised pt OK not to start cymbalta.   Allergic rhinitis- we discussed that symptoms are still present despite maximum medical therapy. Discussed referral to allergist for consideration for allergy shots in the future. Will defer not due to covid.  Non-obstructive CAD- clinically stable with med management- defer management to cardiology.  Follow Up Instructions: 3 months, sooner if problems/concerns.    I discussed the assessment and treatment plan with the patient. The patient was provided an opportunity to ask questions and all were answered. The patient agreed with the plan and demonstrated an understanding of the instructions.   The patient was advised to call back or seek an in-person evaluation if the symptoms worsen or if the condition fails to improve as anticipated.    Nance Pear, NP

## 2018-12-29 ENCOUNTER — Other Ambulatory Visit: Payer: Self-pay | Admitting: Family

## 2018-12-29 DIAGNOSIS — M545 Low back pain, unspecified: Secondary | ICD-10-CM

## 2019-01-11 ENCOUNTER — Telehealth: Payer: Self-pay | Admitting: Internal Medicine

## 2019-01-11 MED ORDER — ISOSORBIDE MONONITRATE ER 30 MG PO TB24: 15.0000 mg | ORAL_TABLET | Freq: Every day | ORAL | 6 refills | Status: DC

## 2019-01-11 NOTE — Telephone Encounter (Signed)
Pt calling requesting a refill on isosorbide sent to [facility]. Please address

## 2019-01-11 NOTE — Telephone Encounter (Signed)
ISOSORBIDE refilled.

## 2019-01-11 NOTE — Telephone Encounter (Signed)
This message was left on my voice mail and sent to you.

## 2019-01-11 NOTE — Telephone Encounter (Signed)
Patient needs to have the pharmacy call. Thanks

## 2019-01-14 ENCOUNTER — Other Ambulatory Visit: Payer: Self-pay | Admitting: Cardiovascular Disease

## 2019-01-20 ENCOUNTER — Other Ambulatory Visit: Payer: Self-pay | Admitting: Family

## 2019-01-23 ENCOUNTER — Telehealth: Payer: Self-pay | Admitting: Cardiovascular Disease

## 2019-01-23 NOTE — Telephone Encounter (Signed)
Left message to call and schedule cardiac ct.

## 2019-01-30 ENCOUNTER — Other Ambulatory Visit: Payer: Self-pay | Admitting: Family

## 2019-02-12 ENCOUNTER — Telehealth: Payer: Self-pay

## 2019-02-12 NOTE — Telephone Encounter (Signed)
Spoke with Cody Raymond, [facility] Ref Coordinator, who has made several attempts to contact pt to schedule cardiac CT. She states she spoke with pt who questioned if cardiac CT needed. She states pt referred to his ED admission on 3/27 stating that he was told he may need stress test instead of cardiac CT. Nurse was directed to the following included in discharge summary:  "Interim: Case was discussed by Dr. Earnest Conroy with Dr. Stanford Breed covering for cardiology. Advised to observe, trend cardiac enzymes, echo to rule out wall motion abnormalities. Patient to follow up outpatient for stress test vs cardiac CT. Trop x 4 have been negative. Echo with normal EF without wall motion abnl reported. Patient resumed on his metoprolol, imdur for discharge home."  Please advise on whether pt should proceed with cardiac CT scheduling or coronary CTA

## 2019-02-12 NOTE — Telephone Encounter (Signed)
I think I ordered a cardiac CTA because the chest pain he was experiencing

## 2019-02-22 ENCOUNTER — Telehealth (HOSPITAL_COMMUNITY): Payer: Self-pay | Admitting: Emergency Medicine

## 2019-02-22 NOTE — Telephone Encounter (Signed)
Reaching out to patient to offer assistance regarding upcoming cardiac imaging study; pt verbalizes understanding of appt date/time, parking situation and where to check in, pre-test NPO status and medications ordered, and verified current allergies; name and call back number provided for further questions should they arise Franco Duley RN Navigator Cardiac Imaging [facility] [phone] office [phone] cell  Pt denies covid symptoms, verbalized understanding of visitor policy. 

## 2019-02-25 ENCOUNTER — Other Ambulatory Visit: Payer: Self-pay | Admitting: Family

## 2019-02-26 ENCOUNTER — Ambulatory Visit (HOSPITAL_COMMUNITY)
Admission: RE | Admit: 2019-02-26 | Discharge: 2019-02-26 | Disposition: A | Discharge status: Home / Self Care | Payer: Medicare Other | Source: Ambulatory Visit | Attending: Cardiovascular Disease | Admitting: Cardiovascular Disease

## 2019-02-26 ENCOUNTER — Other Ambulatory Visit: Payer: Self-pay

## 2019-02-26 ENCOUNTER — Encounter (HOSPITAL_COMMUNITY): Payer: Self-pay

## 2019-02-26 DIAGNOSIS — Z8774 Personal history of (corrected) congenital malformations of heart and circulatory system: Secondary | ICD-10-CM | POA: Diagnosis not present

## 2019-02-26 DIAGNOSIS — I700 Atherosclerosis of aorta: Secondary | ICD-10-CM | POA: Insufficient documentation

## 2019-02-26 DIAGNOSIS — R933 Abnormal findings on diagnostic imaging of other parts of digestive tract: Secondary | ICD-10-CM | POA: Diagnosis not present

## 2019-02-26 DIAGNOSIS — R079 Chest pain, unspecified: Secondary | ICD-10-CM

## 2019-02-26 LAB — POCT I-STAT CREATININE: Creatinine, Ser: 0.8 mg/dL (ref 0.61–1.24)

## 2019-02-26 MED ORDER — IOHEXOL 350 MG/ML SOLN
80.0000 mL | Freq: Once | INTRAVENOUS | Status: AC | PRN
  Administered 2019-02-26: 80 mL via INTRAVENOUS

## 2019-02-26 MED ORDER — NITROGLYCERIN 0.4 MG SL SUBL
SUBLINGUAL_TABLET | SUBLINGUAL | Status: AC
  Administered 2019-02-26: 0.8 mg via SUBLINGUAL
  Filled 2019-02-26: qty 2

## 2019-02-26 MED ORDER — NITROGLYCERIN 0.4 MG SL SUBL
0.8000 mg | SUBLINGUAL_TABLET | Freq: Once | SUBLINGUAL | Status: AC
  Administered 2019-02-26: 12:00:00 0.8 mg via SUBLINGUAL
  Filled 2019-02-26: qty 25

## 2019-02-26 MED ORDER — SODIUM CHLORIDE 0.9 % IV BOLUS
250.0000 mL | Freq: Once | INTRAVENOUS | Status: AC
  Administered 2019-02-26: 250 mL via INTRAVENOUS

## 2019-02-26 NOTE — Progress Notes (Signed)
Paged Dr. Johnsie Cancel.  Pt BP 93/67.  Pt states he becomes symptomatic when BP drops.  Not symptomatic at this time

## 2019-02-26 NOTE — Progress Notes (Signed)
BP 102/62.  Pt states he is fine to go.  PIV removed and dressing applied.  Discharge instructions discussed.  Pt discharged.

## 2019-02-26 NOTE — Progress Notes (Signed)
Exam completed.  BP 90/53.  Gave patient beverage and crackers.  C/O slight dizziness.  Will recheck BP after food and drink

## 2019-02-26 NOTE — Progress Notes (Signed)
Spoke with Dr. Johnsie Cancel.  Orders received to admin 250cc bolus NS and pt to receive 0.8mg  Nitro SL for CTA.  If HR remains >70 before CTA, to call Dr. Johnsie Cancel back.  Pt to be scanned from clavicles down, per Dr. Johnsie Cancel.  Will let CT Tech know.

## 2019-03-25 ENCOUNTER — Other Ambulatory Visit: Payer: Self-pay | Admitting: Family

## 2019-04-09 ENCOUNTER — Telehealth: Payer: Self-pay | Admitting: Internal Medicine

## 2019-04-09 NOTE — Telephone Encounter (Signed)
LMTCB to receive consent for change from "office visit" to "virtual visit" for lipid clinic follow up Please offer this to patient when he calls back  MyChart message sent concerning same matter

## 2019-04-11 ENCOUNTER — Other Ambulatory Visit: Payer: Self-pay

## 2019-04-11 DIAGNOSIS — Z20822 Contact with and (suspected) exposure to covid-19: Secondary | ICD-10-CM

## 2019-04-13 LAB — NOVEL CORONAVIRUS, NAA: SARS-CoV-2, NAA: NOT DETECTED

## 2019-04-15 ENCOUNTER — Other Ambulatory Visit: Payer: Self-pay | Admitting: Family

## 2019-04-16 ENCOUNTER — Telehealth: Payer: Self-pay | Admitting: Internal Medicine

## 2019-04-16 NOTE — Telephone Encounter (Signed)
LVM, reminding pt of his appt on [phone] with Dr Debara Pickett.

## 2019-04-17 ENCOUNTER — Telehealth: Payer: Medicare Other | Admitting: Internal Medicine

## 2019-04-19 ENCOUNTER — Other Ambulatory Visit: Payer: Self-pay

## 2019-04-19 ENCOUNTER — Encounter: Payer: Self-pay | Admitting: Cardiology

## 2019-04-19 ENCOUNTER — Ambulatory Visit: Payer: Medicare Other | Admitting: Cardiology

## 2019-04-19 VITALS — BP 98/70 | HR 106 | Temp 97.3°F | Ht 68.0 in | Wt 190.0 lb

## 2019-04-19 DIAGNOSIS — E1142 Type 2 diabetes mellitus with diabetic polyneuropathy: Secondary | ICD-10-CM

## 2019-04-19 DIAGNOSIS — R0789 Other chest pain: Secondary | ICD-10-CM | POA: Diagnosis not present

## 2019-04-19 DIAGNOSIS — I201 Angina pectoris with documented spasm: Secondary | ICD-10-CM

## 2019-04-19 DIAGNOSIS — R000 Tachycardia, unspecified: Secondary | ICD-10-CM | POA: Diagnosis not present

## 2019-04-19 DIAGNOSIS — E785 Hyperlipidemia, unspecified: Secondary | ICD-10-CM

## 2019-04-19 DIAGNOSIS — I712 Thoracic aortic aneurysm, without rupture, unspecified: Secondary | ICD-10-CM

## 2019-04-19 DIAGNOSIS — I10 Essential (primary) hypertension: Secondary | ICD-10-CM

## 2019-04-19 DIAGNOSIS — Z8249 Family history of ischemic heart disease and other diseases of the circulatory system: Secondary | ICD-10-CM | POA: Insufficient documentation

## 2019-04-19 DIAGNOSIS — Z8774 Personal history of (corrected) congenital malformations of heart and circulatory system: Secondary | ICD-10-CM

## 2019-04-19 LAB — LIPID PANEL
Chol/HDL Ratio: 2.6 ratio (ref 0.0–5.0)
Cholesterol, Total: 82 mg/dL — ABNORMAL LOW (ref 100–199)
HDL: 31 mg/dL — ABNORMAL LOW (ref >39)
LDL Calculated: 32 mg/dL (ref 0–99)
Triglycerides: 95 mg/dL (ref 0–149)
VLDL Cholesterol Cal: 19 mg/dL (ref 5–40)

## 2019-04-19 LAB — LDL CHOLESTEROL, DIRECT: LDL Direct: 36 mg/dL (ref 0–99)

## 2019-04-19 NOTE — Assessment & Plan Note (Signed)
No significant CAD by cath in '07, 2011, and normal coronaries by CTA 02/26/2019

## 2019-04-19 NOTE — Assessment & Plan Note (Signed)
B/P 100/50 by me. He says he has increased B/P in the evenings

## 2019-04-19 NOTE — Patient Instructions (Addendum)
Medication Instructions:  DO NOT TAKE A EXTRA DOSE OF LISINOPRIL UNLESS YOUR BLOOD PRESSURE IS OVER 150/90 FOR 2 READINGS  If you need a refill on your cardiac medications before your next appointment, please call your pharmacy.   Lab work: Your physician recommends that you return for lab work in: [place] T4 If you have labs (blood work) drawn today and your tests are completely normal, you will receive your results only by: Marland Kitchen MyChart Message (if you have MyChart) OR . A paper copy in the mail If you have any lab test that is abnormal or we need to change your treatment, we will call you to review the results.  Testing/Procedures: NONE   Follow-Up: At [facility], you and your health needs are our priority.  As part of our continuing mission to provide you with exceptional heart care, we have created designated Provider Care Teams.  These Care Teams include your primary Cardiologist (physician) and Advanced Practice Providers (APPs -  Physician Assistants and Nurse Practitioners) who all work together to provide you with the care you need, when you need it. You will need a follow up appointment in 3 months.  Please call our office 2 months in advance to schedule this appointment.  You may see DR Quay Burow or one of the following Advanced Practice Providers on your designated Care Team:   Kerin Ransom, PA-C Roby Lofts, [place] . Sande Rives, PA-C  Any Other Special Instructions Will Be Listed Below (If Applicable). CHECK YOUR BLOOD PRESSURE 3 TIMES A WEEK Monday MORNINGS Wednesday AFTER LUNCH AND Friday EVENINGS

## 2019-04-19 NOTE — Assessment & Plan Note (Signed)
Sees Dr Claudie Leach for lipids

## 2019-04-19 NOTE — Assessment & Plan Note (Signed)
Repair at age 48-normal echo March 2020

## 2019-04-19 NOTE — Assessment & Plan Note (Signed)
C/O resting tachycardia for the past month- noted this on his fit bit watch

## 2019-04-19 NOTE — Progress Notes (Signed)
Cardiology Office Note:    Date:  04/19/2019   ID:  Cody Raymond, DOB 1971-08-27, MRN [id number]  PCP:  [name], NP  Cardiologist:  Quay Burow, MD  Electrophysiologist:  None   Referring MD: [name], NP   Chief Complaint  Patient presents with  . Follow-up    discuss lipids, needs labs   C/O resting tachcardia  History of Present Illness:    Cody Raymond is a 48 y.o. male with a hx of PDA repair at age 18.  He has a history of chronic chest pain.  He had nonobstructive coronary disease by catheterization in 2007, 2011, and a normal coronary CT angiogram 02/26/2019.  Echocardiogram in March 2020 showed normal LV function.  He had a past history of syncope, he had a loop recorder in 2013 that was removed in 2015 and did not show any significant arrhythmia.  He has a history of non-insulin-dependent diabetes and dyslipidemia.  He has a strong family history of coronary disease.  He has a stable 3.1 cm aortic arch enlargement by CT September 2019.  He is in the office today for follow-up.  He complains of tachycardia.  He says he has noticed that over the past month.  I reviewed his vital signs graft and it appears over the last year his heart rate is gradually increased and is now averaging 90-110.  He says he exercises daily with walking.  He is noticed his heart rate is near 100 at night by his Fitbit watch.  He says it makes him feel fatigued.  He also complains of labile blood pressure.  He checks his blood pressure at least once a day.  He says in the evening his blood pressure shoots up and he has to take an extra 5 mg of lisinopril.  I asked him what his blood pressure is at this time and he said 140/80.  Past Medical History:  Diagnosis Date  . Aneurysm ([place])   . Asthma   . Blood transfusion   . CAD (coronary artery disease), non obstructive on cath 2011 04/05/2012  . Coronary artery disease   . Diabetes mellitus   . Diabetes mellitus without  complication ([place])   . Enlarged prostate   . GERD (gastroesophageal reflux disease)   . H/O syncope   . Heart disease   . History of repair of patent ductus arteriosus 07/23/2016  . Hyperlipidemia   . Hypertension   . 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 Refusal of blood transfusions as patient is Jehovah's Witness     Past Surgical History:  Procedure Laterality Date  . APPENDECTOMY    . BACK SURGERY    . CARDIAC CATHETERIZATION  2007   Clean Cardiac Cath ([place]), with RCA 30% narrowing likely due to catheter induced spasm in 09/02/10.  Marland Kitchen CARDIAC CATHETERIZATION  09/02/2010   mod. nonobstructive disease in the RCA and CX, tortuous LAD  . COLONOSCOPY W/ POLYPECTOMY  03/17/11   diminutive polyp  . LOOP RECORDER EXPLANT N/A 01/14/2014   Procedure: LOOP RECORDER EXPLANT;  Surgeon: Sanda Klein, MD;  Location: [facility];  Service: Cardiovascular;  Laterality: N/A;  . LOOP RECORDER IMPLANT  04/06/2012   Reveal XT 1497  . LOOP RECORDER IMPLANT N/A 04/06/2012   Procedure: LOOP RECORDER IMPLANT;  Surgeon: Sanda Klein, MD;  Location: [facility];  Service: Cardiovascular;  Laterality: N/A;  . NM MYOCAR PERF WALL MOTION  01/25/2008   mild anteroapical wall ischemia  .  PATENT DUCTUS ARTERIOUS REPAIR     at age 73    Current Medications: Current Meds  Medication Sig  . albuterol (PROVENTIL) (2.5 MG/3ML) 0.083% nebulizer solution USE 1 VIAL VIA NEBULIZER  EVERY 6 HOURS AS NEEDED FOR WHEEZING OR SHORTNESS OF  BREATH (Patient taking differently: Take 2.5 mg by nebulization every 6 (six) hours as needed for wheezing or shortness of breath. )  . Ascorbic Acid (VITAMIN C) 1000 MG tablet Take 1,000 mg by mouth daily.  Marland Kitchen aspirin EC 81 MG EC tablet Take 1 tablet (81 mg total) by mouth daily.  Marland Kitchen atorvastatin (LIPITOR) 80 MG tablet Take 1 tablet (80 mg total) by mouth daily at 6 PM.  . bismuth subsalicylate (PEPTO BISMOL) 262 MG/15ML suspension Take 30 mLs by mouth every 6 (six)  hours as needed for indigestion.  . budesonide (PULMICORT) 0.5 MG/2ML nebulizer solution Inhale 2 mLs into the lungs every 6 (six) hours as needed for shortness of breath.  . cetirizine (ZYRTEC) 10 MG tablet Take 10 mg by mouth daily.  . clonazePAM (KLONOPIN) 1 MG tablet TAKE 1 TABLET BY MOUTH  DAILY (Patient taking differently: Take 1 mg by mouth daily. )  . diclofenac sodium (VOLTAREN) 1 % GEL Apply 2 g topically 4 (four) times daily as needed (pain).  . DULoxetine (CYMBALTA) 30 MG capsule TAKE 1 CAPSULE BY MOUTH  DAILY  . ezetimibe (ZETIA) 10 MG tablet TAKE 1 TABLET BY MOUTH  DAILY  . fluticasone (FLONASE) 50 MCG/ACT nasal spray Place 2 sprays into both nostrils as needed for allergies or rhinitis.  Vanessa Kick Ethyl (VASCEPA) 1 g CAPS Take 2 capsules (2 g total) by mouth 2 (two) times daily.  . isosorbide mononitrate (IMDUR) 30 MG 24 hr tablet Take 0.5 tablets (15 mg total) by mouth daily.  Marland Kitchen ketoconazole (NIZORAL) 2 % shampoo APPLY TOPICALLY DAILY AS  DIRECTED (Patient taking differently: Apply 1 application topically 3 (three) times a week. )  . lisinopril (PRINIVIL,ZESTRIL) 5 MG tablet Take 1 tablet (5 mg total) by mouth daily.  Marland Kitchen lisinopril (ZESTRIL) 10 MG tablet TAKE 1 TABLET BY MOUTH  DAILY  . meclizine (ANTIVERT) 25 MG tablet Take 1 tablet (25 mg total) by mouth 3 (three) times daily as needed for dizziness.  . meloxicam (MOBIC) 7.5 MG tablet Take 1 tablet (7.5 mg total) by mouth daily as needed for pain.  . metFORMIN (GLUCOPHAGE-XR) 500 MG 24 hr tablet Take 1,000 mg by mouth daily.  . metoprolol tartrate (LOPRESSOR) 50 MG tablet Take 1 tablet (50 mg total) by mouth 2 (two) times daily.  . montelukast (SINGULAIR) 10 MG tablet Take 10 mg by mouth at bedtime.  . Multiple Vitamin (MULTIVITAMIN WITH MINERALS) TABS tablet Take 1 tablet by mouth daily.  . nitroGLYCERIN (NITROSTAT) 0.4 MG SL tablet Place 1 tablet (0.4 mg total) under the tongue every 5 (five) minutes as needed for chest pain.   Glory Rosebush VERIO test strip USE WITH METER TO CHECK  BLOOD SUGAR 3 TIMES DAILY  . tamsulosin (FLOMAX) 0.4 MG CAPS capsule Take 1 capsule (0.4 mg total) by mouth daily.  . traMADol (ULTRAM) 50 MG tablet TAKE 1 TABLET BY MOUTH  EVERY 6 HOURS AS NEEDED FOR SEVERE PAIN (Patient taking differently: Take 50 mg by mouth every 6 (six) hours as needed for severe pain. TAKE 1 TABLET BY MOUTH  EVERY 6 HOURS AS NEEDED FOR SEVERE PAIN)  . traZODone (DESYREL) 100 MG tablet TAKE 1 TABLET BY MOUTH AT  BEDTIME     Allergies:   Benadryl [diphenhydramine hcl]   Social History   Socioeconomic History  . Marital status: Married    Spouse name: Not on file  . Number of children: Not on file  . Years of education: Not on file  . Highest education level: Not on file  Occupational History  . Not on file  Social Needs  . Financial resource strain: Not on file  . Food insecurity    Worry: Not on file    Inability: Not on file  . Transportation needs    Medical: Not on file    Non-medical: Not on file  Tobacco Use  . Smoking status: Never Smoker  . Smokeless tobacco: Never Used  Substance and Sexual Activity  . Alcohol use: No  . Drug use: No  . Sexual activity: Yes  Lifestyle  . Physical activity    Days per week: Not on file    Minutes per session: Not on file  . Stress: Not on file  Relationships  . Social Herbalist on phone: Not on file    Gets together: Not on file    Attends religious service: Not on file    Active member of club or organization: Not on file    Attends meetings of clubs or organizations: Not on file    Relationship status: Not on file  Other Topics Concern  . Not on file  Social History Narrative   ** Merged History Encounter **       Holter monitor 08/2010: PVCs and sinus tachy.   Sleep Study (02/2008): mild sleep apnea, no indication for CPAP.     Family History: The patient's family history includes Allergies in his daughter; Aneurysm in his father;  Breast cancer in his maternal grandmother; Colon cancer in his paternal grandfather and paternal uncle; Colon polyps in his mother; Coronary artery disease in his father and mother; Heart attack (age of onset: 26) in his father; Heart disease in his mother; Liver disease in an other family member; Migraines in his mother; Prostate cancer in his paternal grandfather; Stomach cancer in his brother.  ROS:   Please see the history of present illness.     All other systems reviewed and are negative.  EKGs/Labs/Other Studies Reviewed:    The following studies were reviewed today: Coronary CTA 02/26/2019  Recent Labs: 10/26/2018: ALT 22 12/14/2018: B Natriuretic Peptide 13.8; TSH 0.805 12/15/2018: BUN 10; Hemoglobin 12.8; Platelets 261; Potassium 3.8; Sodium 138 02/26/2019: Creatinine, Ser 0.80  Recent Lipid Panel    Component Value Date/Time   CHOL 195 07/31/2018 1044   TRIG 347.0 (H) 07/31/2018 1044   HDL 43.70 07/31/2018 1044   CHOLHDL 4 07/31/2018 1044   VLDL 69.4 (H) 07/31/2018 1044   LDLCALC 122 (H) 04/17/2015 0940   LDLDIRECT 119.0 07/31/2018 1044    Physical Exam:    VS:  BP 98/70   Pulse (!) 106   Temp (!) 97.3 F (36.3 C)   Ht 5\' 8"  (1.727 m)   Wt 190 lb (86.2 kg)   SpO2 97%   BMI 28.89 kg/m     Wt Readings from Last 3 Encounters:  04/19/19 190 lb (86.2 kg)  12/14/18 195 lb (88.5 kg)  11/26/18 198 lb (89.8 kg)     GEN:  Well nourished, well developed in no acute distress HEENT: Normal NECK: No JVD; No carotid bruits LYMPHATICS: No lymphadenopathy CARDIAC: RRR, no murmurs, rubs, gallops RESPIRATORY:  Clear  to auscultation without rales, wheezing or rhonchi  ABDOMEN: Soft, non-tender, non-distended MUSCULOSKELETAL:  No edema; No deformity  SKIN: Warm and dry NEUROLOGIC:  Alert and oriented x 3 PSYCHIATRIC:  Normal affect   ASSESSMENT:    Tachycardia C/O resting tachycardia for the past month- noted this on his fit bit watch  Chest pain No significant CAD by  cath in '07, 2011, and normal coronaries by CTA 02/26/2019  Dyslipidemia Sees Dr Claudie Leach for lipids  Essential hypertension B/P 100/50 by me. He says he has increased B/P in the evenings  Type 2 diabetes, controlled, with peripheral neuropathy ([place]) On oral agents  History of repair of patent ductus arteriosus Repair at age 28-normal echo March 2020  PLAN:    I confirmed he is taking his beta blocker and no OTC medications.  His TSH was low normal in March, will repeat with free T4.  I told him to check his B/P 3 times a week and only take an extra Lisinopril 5 mg for a B/P of 150/90 for two reading an hour apart.  He is due for lipids today. F/U Dr Gwenlyn Found 3 months.    Medication Adjustments/Labs and Tests Ordered: Current medicines are reviewed at length with the patient today.  Concerns regarding medicines are outlined above.  Orders Placed This Encounter  Procedures  . TSH + free T4   No orders of the defined types were placed in this encounter.   Patient Instructions  Medication Instructions:  DO NOT TAKE A EXTRA DOSE OF LISINOPRIL UNLESS YOUR BLOOD PRESSURE IS OVER 150/90 FOR 2 READINGS  If you need a refill on your cardiac medications before your next appointment, please call your pharmacy.   Lab work: Your physician recommends that you return for lab work in: [place] T4 If you have labs (blood work) drawn today and your tests are completely normal, you will receive your results only by: Marland Kitchen MyChart Message (if you have MyChart) OR . A paper copy in the mail If you have any lab test that is abnormal or we need to change your treatment, we will call you to review the results.  Testing/Procedures: NONE   Follow-Up: At [facility], you and your health needs are our priority.  As part of our continuing mission to provide you with exceptional heart care, we have created designated Provider Care Teams.  These Care Teams include your primary Cardiologist (physician)  and Advanced Practice Providers (APPs -  Physician Assistants and Nurse Practitioners) who all work together to provide you with the care you need, when you need it. You will need a follow up appointment in 3 months.  Please call our office 2 months in advance to schedule this appointment.  You may see DR Quay Burow or one of the following Advanced Practice Providers on your designated Care Team:   Kerin Ransom, PA-C Roby Lofts, [place] . Sande Rives, PA-C  Any Other Special Instructions Will Be Listed Below (If Applicable). CHECK YOUR BLOOD PRESSURE 3 TIMES A WEEK Monday MORNINGS Wednesday AFTER LUNCH AND Friday EVENINGS      Signed, Kerin Ransom, [place]  04/19/2019 8:33 AM    [facility]

## 2019-04-19 NOTE — Assessment & Plan Note (Signed)
On oral agents 

## 2019-04-20 LAB — TSH+FREE T4
Free T4: 1.5 ng/dL (ref 0.82–1.77)
TSH: 1.79 u[IU]/mL (ref 0.450–4.500)

## 2019-04-22 ENCOUNTER — Other Ambulatory Visit: Payer: Self-pay | Admitting: Family

## 2019-04-22 ENCOUNTER — Other Ambulatory Visit: Payer: Self-pay | Admitting: Physical Medicine & Rehabilitation

## 2019-04-22 DIAGNOSIS — E785 Hyperlipidemia, unspecified: Secondary | ICD-10-CM

## 2019-05-10 ENCOUNTER — Other Ambulatory Visit: Payer: Self-pay | Admitting: Physical Medicine & Rehabilitation

## 2019-05-10 ENCOUNTER — Encounter: Payer: Self-pay | Admitting: Family

## 2019-05-13 NOTE — Telephone Encounter (Signed)
Last Tramadol RX: 02/01/18, #120 x 4 refills Last OV: 12/21/18 Next OV: was due in July and now PAST DUE UDS: 09/29/17 CSC: 09/29/17

## 2019-05-14 ENCOUNTER — Telehealth: Payer: Self-pay

## 2019-05-14 MED ORDER — TRAMADOL HCL 50 MG PO TABS: ORAL_TABLET | ORAL | 0 refills | Status: DC

## 2019-05-14 NOTE — Telephone Encounter (Signed)
Patient called to get a refill on Tramadol. Advised patient make an appt as he has not been seen in over a year. Patient made appt and a month supply was called in/

## 2019-05-16 ENCOUNTER — Encounter: Payer: Self-pay | Admitting: Family

## 2019-05-17 ENCOUNTER — Other Ambulatory Visit: Payer: Self-pay

## 2019-05-17 MED ORDER — LISINOPRIL 10 MG PO TABS: 10.0000 mg | ORAL_TABLET | Freq: Every day | ORAL | 0 refills | Status: DC

## 2019-05-17 NOTE — Telephone Encounter (Signed)
RX sent to [facility] for 30 day supply of the medication, patient advised he may have to pay for it our of pocket

## 2019-05-20 ENCOUNTER — Other Ambulatory Visit: Payer: Self-pay | Admitting: Physical Medicine & Rehabilitation

## 2019-05-20 ENCOUNTER — Other Ambulatory Visit: Payer: Self-pay | Admitting: Family

## 2019-05-20 DIAGNOSIS — M545 Low back pain, unspecified: Secondary | ICD-10-CM

## 2019-05-21 MED ORDER — TAMSULOSIN HCL 0.4 MG PO CAPS: 0.4000 mg | ORAL_CAPSULE | Freq: Every day | ORAL | 0 refills | Status: DC

## 2019-05-21 NOTE — Telephone Encounter (Signed)
Tamsulosin Rx printed and was faxed to [facility].

## 2019-05-27 ENCOUNTER — Emergency Department (HOSPITAL_COMMUNITY): Payer: Medicare Other

## 2019-05-27 ENCOUNTER — Emergency Department (HOSPITAL_COMMUNITY)
Admission: EM | Admit: 2019-05-27 | Discharge: 2019-05-28 | Disposition: A | Discharge status: Home / Self Care | Payer: Medicare Other | Attending: Emergency Medicine | Admitting: Emergency Medicine

## 2019-05-27 ENCOUNTER — Other Ambulatory Visit: Payer: Self-pay

## 2019-05-27 DIAGNOSIS — R079 Chest pain, unspecified: Secondary | ICD-10-CM | POA: Diagnosis present

## 2019-05-27 DIAGNOSIS — E119 Type 2 diabetes mellitus without complications: Secondary | ICD-10-CM | POA: Insufficient documentation

## 2019-05-27 DIAGNOSIS — R51 Headache: Secondary | ICD-10-CM | POA: Insufficient documentation

## 2019-05-27 DIAGNOSIS — Z79899 Other long term (current) drug therapy: Secondary | ICD-10-CM | POA: Insufficient documentation

## 2019-05-27 DIAGNOSIS — J45909 Unspecified asthma, uncomplicated: Secondary | ICD-10-CM | POA: Insufficient documentation

## 2019-05-27 DIAGNOSIS — Z7982 Long term (current) use of aspirin: Secondary | ICD-10-CM | POA: Diagnosis not present

## 2019-05-27 DIAGNOSIS — I2510 Atherosclerotic heart disease of native coronary artery without angina pectoris: Secondary | ICD-10-CM | POA: Insufficient documentation

## 2019-05-27 DIAGNOSIS — Z95818 Presence of other cardiac implants and grafts: Secondary | ICD-10-CM | POA: Insufficient documentation

## 2019-05-27 DIAGNOSIS — Z7984 Long term (current) use of oral hypoglycemic drugs: Secondary | ICD-10-CM | POA: Insufficient documentation

## 2019-05-27 LAB — CBC
HCT: 41.3 % (ref 39.0–52.0)
Hemoglobin: 13.7 g/dL (ref 13.0–17.0)
MCH: 26.9 pg (ref 26.0–34.0)
MCHC: 33.2 g/dL (ref 30.0–36.0)
MCV: 81 fL (ref 80.0–100.0)
Platelets: 253 10*3/uL (ref 150–400)
RBC: 5.1 MIL/uL (ref 4.22–5.81)
RDW: 16.6 % — ABNORMAL HIGH (ref 11.5–15.5)
WBC: 8.2 10*3/uL (ref 4.0–10.5)
nRBC: 0 % (ref 0.0–0.2)

## 2019-05-27 LAB — BASIC METABOLIC PANEL
Anion gap: 9 (ref 5–15)
BUN: 11 mg/dL (ref 6–20)
CO2: 23 mmol/L (ref 22–32)
Calcium: 8.9 mg/dL (ref 8.9–10.3)
Chloride: 100 mmol/L (ref 98–111)
Creatinine, Ser: 0.93 mg/dL (ref 0.61–1.24)
GFR calc Af Amer: >60 mL/min (ref >60)
GFR calc non Af Amer: >60 mL/min (ref >60)
Glucose, Bld: 162 mg/dL — ABNORMAL HIGH (ref 70–99)
Potassium: 3.9 mmol/L (ref 3.5–5.1)
Sodium: 132 mmol/L — ABNORMAL LOW (ref 135–145)

## 2019-05-27 LAB — TROPONIN I (HIGH SENSITIVITY): Troponin I (High Sensitivity): 3 ng/L (ref <18)

## 2019-05-27 MED ORDER — SODIUM CHLORIDE 0.9% FLUSH: 3.0000 mL | Freq: Once | INTRAVENOUS | Status: DC

## 2019-05-27 NOTE — ED Notes (Signed)
Patient transported to X-ray 

## 2019-05-27 NOTE — ED Provider Notes (Signed)
[facility] EMERGENCY DEPARTMENT Provider Note   CSN: [id number] Arrival date & time: 05/27/19  2154     History   Chief Complaint Chief Complaint  Patient presents with  . Chest Pain    HPI Cody Raymond is a 48 y.o. male.     The history is provided by the patient and medical records.  Chest Pain    48 y.o. M with hx of asthma, CAD with non-obstructive cath in 2011, DM, BPH, GERD, HLP, HTN, presenting to the ED for chest pain.  Patient states he started having pain about 2 hours PTA.  Patient reports pain is center/left sided, sharp in nature without radiation.  He denies associated SOB, nausea, diaphoresis, dizziness, or weakness.  No pain into the neck or arms.  He did take 2 SL NTG and ASA at home and pain has started resolving, still a very minimal discomfort.  He does report he gets chest pain quite often.  Sometimes more frequent than others, may go 3-5 days without pain and then may have pain on a daily basis afterwards.  He is on daily isosorbide which seems to help.  He does report he has been self adjusting his BP meds at home due to some mild headaches across his forehead as he was concerned it was from his medications.  His BP was elevated earlier today so took extra half of his BP tablet.  Patient is followed by cardiology, Dr. Gwenlyn Found-- however states he has never actually seen him in person, it has all been E-visits.  Past Medical History:  Diagnosis Date  . Aneurysm ([place])   . Asthma   . Blood transfusion   . CAD (coronary artery disease), non obstructive on cath 2011 04/05/2012  . Coronary artery disease   . Diabetes mellitus   . Diabetes mellitus without complication ([place])   . Enlarged prostate   . GERD (gastroesophageal reflux disease)   . H/O syncope   . Heart disease   . History of repair of patent ductus arteriosus 07/23/2016  . Hyperlipidemia   . Hypertension   . 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 Refusal of blood transfusions as  patient is Jehovah's Witness     Patient Active Problem List   Diagnosis Date Noted  . Family history of coronary artery disease 04/19/2019  . Tachycardia 04/19/2019  . Chest pain 12/14/2018  . Dyslipidemia 10/23/2018  . Thoracic aortic aneurysm ([place]) 10/23/2018  . Right shoulder injury, initial encounter 06/18/2017  . Trochanteric bursitis of right hip 10/17/2016  . History of repair of patent ductus arteriosus 07/23/2016  . Cervicalgia 07/08/2016  . Fibromyalgia 07/08/2016  . Cervical spondylosis without myelopathy 07/08/2016  . Essential hypertension 02/08/2016  . Lumbar facet arthropathy 10/19/2015  . Chronic lumbar radiculopathy 06/22/2015  . History of syncope 10/20/2014  . Urinary frequency 07/08/2014  . Allergic rhinitis 07/08/2014  . Lumbar radiculopathy, chronic 07/26/2013  . Morton's neuroma of left foot 03/18/2013  . Depression with anxiety 12/21/2012  . Insomnia 07/23/2012  . CAD (coronary artery disease), non obstructive on cath 2011 04/05/2012  . Right bundle branch block 04/04/2012  . Peripheral neuropathy 04/04/2012  . Chronic back pain 03/28/2011  . Palpitations 03/01/2011  . Type 2 diabetes, controlled, with peripheral neuropathy ([place]) 09/07/2010  . GERD 09/07/2010  . OBESITY, NOS 11/16/2006  . ERECTILE DYSFUNCTION 11/16/2006  . Asthma with acute exacerbation 11/16/2006    Past Surgical History:  Procedure Laterality Date  . APPENDECTOMY    .  BACK SURGERY    . CARDIAC CATHETERIZATION  2007   Clean Cardiac Cath ([place]), with RCA 30% narrowing likely due to catheter induced spasm in 09/02/10.  Marland Kitchen CARDIAC CATHETERIZATION  09/02/2010   mod. nonobstructive disease in the RCA and CX, tortuous LAD  . COLONOSCOPY W/ POLYPECTOMY  03/17/11   diminutive polyp  . LOOP RECORDER EXPLANT N/A 01/14/2014   Procedure: LOOP RECORDER EXPLANT;  Surgeon: Sanda Klein, MD;  Location: [facility] CATH LAB;  Service: Cardiovascular;  Laterality: N/A;  . LOOP RECORDER  IMPLANT  04/06/2012   Reveal XT [id number]  . LOOP RECORDER IMPLANT N/A 04/06/2012   Procedure: LOOP RECORDER IMPLANT;  Surgeon: Sanda Klein, MD;  Location: [facility] CATH LAB;  Service: Cardiovascular;  Laterality: N/A;  . NM MYOCAR PERF WALL MOTION  01/25/2008   mild anteroapical wall ischemia  . PATENT DUCTUS ARTERIOUS REPAIR     at age 3        Home Medications    Prior to Admission medications   Medication Sig Start Date End Date Taking? Authorizing Provider  albuterol (PROVENTIL) (2.5 MG/3ML) 0.083% nebulizer solution USE 1 VIAL VIA NEBULIZER  EVERY 6 HOURS AS NEEDED FOR WHEEZING OR SHORTNESS OF  BREATH 05/21/19   [name], NP  Ascorbic Acid (VITAMIN C) 1000 MG tablet Take 1,000 mg by mouth daily.    [provider]  aspirin EC 81 MG EC tablet Take 1 tablet (81 mg total) by mouth daily. 12/16/18   Dessa Phi, DO  atorvastatin (LIPITOR) 80 MG tablet TAKE 1 TABLET BY MOUTH ONCE DAILY 04/24/19   [name], NP  bismuth subsalicylate (PEPTO BISMOL) 262 MG/15ML suspension Take 30 mLs by mouth every 6 (six) hours as needed for indigestion.    [provider]  budesonide (PULMICORT) 0.5 MG/2ML nebulizer solution Inhale 2 mLs into the lungs every 6 (six) hours as needed for shortness of breath. 12/14/18   [provider]  cetirizine (ZYRTEC) 10 MG tablet Take 10 mg by mouth daily.    [provider]  clonazePAM (KLONOPIN) 1 MG tablet Take 1 tablet (1 mg total) by mouth daily. 04/24/19   [name], NP  diclofenac sodium (VOLTAREN) 1 % GEL Apply 2 g topically 4 (four) times daily as needed (pain). 10/04/17   [name], NP  DULoxetine (CYMBALTA) 30 MG capsule TAKE 1 CAPSULE BY MOUTH  DAILY 02/04/19   [name], NP  ezetimibe (ZETIA) 10 MG tablet TAKE 1 TABLET BY MOUTH  DAILY 03/27/19   [name], NP  fluticasone (FLONASE) 50 MCG/ACT nasal spray Place 2 sprays into both nostrils as needed for allergies or rhinitis.     [provider]  gabapentin (NEURONTIN) 300 MG capsule TAKE 1 CAPSULE BY MOUTH AT  6AM, 11:30AM AND 4:30PM AND TAKE 3 CAPSULES AT 10:00PM 04/24/19   [name], NP  Icosapent Ethyl (VASCEPA) 1 g CAPS Take 2 capsules (2 g total) by mouth 2 (two) times daily. 12/19/18   Hilty, Nadean Corwin, MD  isosorbide mononitrate (IMDUR) 30 MG 24 hr tablet Take 0.5 tablets (15 mg total) by mouth daily. 01/11/19   Hilty, Nadean Corwin, MD  ketoconazole (NIZORAL) 2 % shampoo APPLY TOPICALLY DAILY AS  DIRECTED 04/24/19   [name], NP  lisinopril (PRINIVIL,ZESTRIL) 5 MG tablet Take 1 tablet (5 mg total) by mouth daily. 12/15/18   Dessa Phi, DO  lisinopril (ZESTRIL) 10 MG tablet TAKE 1 TABLET BY MOUTH  DAILY 02/26/19   [name], NP  lisinopril (  ZESTRIL) 10 MG tablet Take 1 tablet (10 mg total) by mouth daily. 05/17/19   [name], NP  meclizine (ANTIVERT) 25 MG tablet Take 1 tablet (25 mg total) by mouth 3 (three) times daily as needed for dizziness. 06/17/18   Deno Etienne, DO  meloxicam (MOBIC) 7.5 MG tablet TAKE 1 TABLET BY MOUTH  DAILY AS NEEDED FOR PAIN 05/21/19   [name], NP  metFORMIN (GLUCOPHAGE-XR) 500 MG 24 hr tablet TAKE 2 TABLETS BY MOUTH  DAILY WITH BREAKFAST 04/24/19   [name], NP  metoprolol tartrate (LOPRESSOR) 50 MG tablet Take 1 tablet (50 mg total) by mouth 2 (two) times daily. 01/14/19   Hilty, Nadean Corwin, MD  montelukast (SINGULAIR) 10 MG tablet TAKE 1 TABLET BY MOUTH AT  BEDTIME 04/24/19   [name], NP  Multiple Vitamin (MULTIVITAMIN WITH MINERALS) TABS tablet Take 1 tablet by mouth daily.    [provider]  nitroGLYCERIN (NITROSTAT) 0.4 MG SL tablet Place 1 tablet (0.4 mg total) under the tongue every 5 (five) minutes as needed for chest pain. 11/11/15   [name], NP  ONETOUCH VERIO test strip USE WITH METER TO CHECK  BLOOD SUGAR 3 TIMES DAILY 10/02/18   [name], NP  tamsulosin (FLOMAX) 0.4 MG CAPS capsule  Take 1 capsule (0.4 mg total) by mouth daily. 05/21/19   [name], NP  tiZANidine (ZANAFLEX) 2 MG tablet TAKE 1 TABLET BY MOUTH  EVERY 6 HOURS AS NEEDED FOR MUSCLE SPASM(S) 05/21/19   Kirsteins, Luanna Salk, MD  traMADol (ULTRAM) 50 MG tablet TAKE 1 TABLET BY MOUTH  EVERY 6 HOURS AS NEEDED FOR SEVERE PAIN 05/14/19   Charlett Blake, MD  traZODone (DESYREL) 100 MG tablet TAKE 1 TABLET BY MOUTH AT  BEDTIME 04/16/19   [name], NP  zolpidem (AMBIEN) 10 MG tablet TAKE 1 TABLET BY MOUTH AT  BEDTIME AS NEEDED FOR SLEEP 04/24/19   [name], NP  niacin (NIASPAN) 1000 MG CR tablet Take 1 tablet (1,000 mg total) by mouth at bedtime. 07/27/11 11/08/17  Burnice Logan, MD    Family History Family History  Problem Relation Age of Onset  . Colon cancer Paternal Grandfather   . Prostate cancer Paternal Grandfather   . Aneurysm Father   . Heart attack Father 37  . Coronary artery disease Father   . Colon polyps Mother   . Heart disease Mother   . Coronary artery disease Mother   . Migraines Mother   . Breast cancer Maternal Grandmother   . Colon cancer Paternal Uncle        x 2  . Stomach cancer Brother   . Liver disease Other        unsure who it was  . Allergies Daughter     Social History Social History   Tobacco Use  . Smoking status: Never Smoker  . Smokeless tobacco: Never Used  Substance Use Topics  . Alcohol use: No  . Drug use: No     Allergies   Benadryl [diphenhydramine hcl]   Review of Systems Review of Systems  Cardiovascular: Positive for chest pain.  All other systems reviewed and are negative.    Physical Exam Updated Vital Signs BP 137/79 (BP Location: Right Arm)   Pulse 84   Temp 98.5 F (36.9 C) (Oral)   Resp 17   Ht 5\' 8"  (1.727 m)   Wt 84.8 kg   SpO2 98%   BMI 28.43 kg/m   Physical Exam Vitals  signs and nursing note reviewed.  Constitutional:      Appearance: He is well-developed.  HENT:     Head: Normocephalic and  atraumatic.  Eyes:     Conjunctiva/sclera: Conjunctivae normal.     Pupils: Pupils are equal, round, and reactive to light.  Neck:     Musculoskeletal: Normal range of motion.  Cardiovascular:     Rate and Rhythm: Normal rate and regular rhythm.     Heart sounds: Normal heart sounds.  Pulmonary:     Effort: Pulmonary effort is normal.     Breath sounds: Normal breath sounds. No decreased breath sounds or wheezing.     Comments: Lungs clear, NAD Chest:     Comments: Chest wall non-tender Abdominal:     General: Bowel sounds are normal.     Palpations: Abdomen is soft.  Musculoskeletal: Normal range of motion.  Skin:    General: Skin is warm and dry.  Neurological:     Mental Status: He is alert and oriented to person, place, and time.      ED Treatments / Results  Labs (all labs ordered are listed, but only abnormal results are displayed) Labs Reviewed  BASIC METABOLIC PANEL - Abnormal; Notable for the following components:      Result Value   Sodium 132 (*)    Glucose, Bld 162 (*)    All other components within normal limits  CBC - Abnormal; Notable for the following components:   RDW 16.6 (*)    All other components within normal limits  TROPONIN I (HIGH SENSITIVITY)  TROPONIN I (HIGH SENSITIVITY)    EKG None  Radiology Dg Chest 2 View  Result Date: 05/27/2019 CLINICAL DATA:  Chest pain EXAM: CHEST - 2 VIEW COMPARISON:  12/14/2018 FINDINGS: Heart and mediastinal contours are within normal limits. No focal opacities or effusions. No acute bony abnormality. IMPRESSION: No active cardiopulmonary disease. Electronically Signed   By: Rolm Baptise M.D.   On: 05/27/2019 22:17    Procedures Procedures (including critical care time)  Medications Ordered in ED Medications  sodium chloride flush (NS) 0.9 % injection 3 mL (has no administration in time range)     Initial Impression / Assessment and Plan / ED Course  I have reviewed the triage vital signs and the  nursing notes.  Pertinent labs & imaging results that were available during my care of the patient were reviewed by me and considered in my medical decision making (see chart for details).  48 year old male here with chest pain.  This began about 2 hours prior to arrival, has started improving with nitroglycerin and aspirin.  He does have history of nonobstructive CAD based on last cath in 2011.  He is followed by cardiology, Dr. Gwenlyn Found.  Pain sounds somewhat atypical-- sharp with no associated SOB, diaphoresis, nausea, vomiting, neck pain, or left arm pain.  He does report frequent episodes of chest pain.  EKG here without acute ischemic changes.  CXR clear.  Troponin negative x2.  On recheck he is resting comfortably, pain resolved and VSS.  Lower suspicion for ACS, PE, dissection, or other acute cardiac pathology at this time.  Feel he is stable for discharge home with close OP follow-up with cardiology.  He is comfortable with this care plan.  He has been self adjusting his medications at home, I have advised him against this and to take only as directed by his cardiologist.  He may return here for any new/acute changes.  Final Clinical Impressions(s) /  ED Diagnoses   Final diagnoses:  Chest pain in adult    ED Discharge Orders    None       Larene Pickett, PA-C 05/28/19 0205    Gareth Morgan, MD 05/29/19 2040

## 2019-05-27 NOTE — ED Triage Notes (Signed)
Came in via ems; c/o chest pain x 2 hrs. Reported pt recently changed his metoprolol intake. ASA and NTG given PTA. Reported hx of heart cath x1; denies stent placement.

## 2019-05-28 ENCOUNTER — Encounter: Payer: Self-pay | Admitting: Family

## 2019-05-28 LAB — TROPONIN I (HIGH SENSITIVITY): Troponin I (High Sensitivity): 3 ng/L (ref <18)

## 2019-05-28 MED ORDER — ALUM & MAG HYDROXIDE-SIMETH 200-200-20 MG/5ML PO SUSP
30.0000 mL | Freq: Once | ORAL | Status: AC
  Administered 2019-05-28: 02:00:00 30 mL via ORAL
  Filled 2019-05-28: qty 30

## 2019-05-28 MED ORDER — LIDOCAINE VISCOUS HCL 2 % MT SOLN
15.0000 mL | Freq: Once | OROMUCOSAL | Status: AC
  Administered 2019-05-28: 15 mL via ORAL
  Filled 2019-05-28: qty 15

## 2019-05-28 NOTE — Discharge Instructions (Signed)
Continue your home medications as directed. Follow-up with your cardiologist. Return to the ED for new or worsening symptoms.

## 2019-05-31 ENCOUNTER — Other Ambulatory Visit: Payer: Self-pay

## 2019-05-31 ENCOUNTER — Ambulatory Visit (INDEPENDENT_AMBULATORY_CARE_PROVIDER_SITE_OTHER): Payer: Medicare Other | Admitting: Family

## 2019-05-31 DIAGNOSIS — R21 Rash and other nonspecific skin eruption: Secondary | ICD-10-CM

## 2019-05-31 DIAGNOSIS — R0789 Other chest pain: Secondary | ICD-10-CM

## 2019-05-31 MED ORDER — BETAMETHASONE DIPROPIONATE 0.05 % EX CREA: TOPICAL_CREAM | Freq: Two times a day (BID) | CUTANEOUS | 0 refills | Status: AC

## 2019-05-31 NOTE — Progress Notes (Signed)
Virtual Visit via Video Note  I connected with Cody Raymond on 05/31/19 at 11:20 AM EDT by a video enabled telemedicine application and verified that I am speaking with the correct person using two identifiers.  Location: Patient: home Provider: office   I discussed the limitations of evaluation and management by telemedicine and the availability of in person appointments. The patient expressed understanding and agreed to proceed.  History of Present Illness:  Patient is a 48 yr old male who presents today for ER follow up. ER record is reviewed.  Pt was evaluated on 05/27/19 with chief complaint of chest pain. He underwent CXR and tryponin checks (all negative).  He was discharged home. Reports no current chest pain.    Pt reports that he developed a rash on both forearms and behind his right ear. Wonders if it is the medication that was given in the ed "for reflux."   He also reports + rash on hands and behind his ear. Notes rash is pruritic  Past Medical History:  Diagnosis Date  . Aneurysm ([place])   . Asthma   . Blood transfusion   . CAD (coronary artery disease), non obstructive on cath 2011 04/05/2012  . Coronary artery disease   . Diabetes mellitus   . Diabetes mellitus without complication ([place])   . Enlarged prostate   . GERD (gastroesophageal reflux disease)   . H/O syncope   . Heart disease   . History of repair of patent ductus arteriosus 07/23/2016  . Hyperlipidemia   . Hypertension   . 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 Refusal of blood transfusions as patient is Jehovah's Witness      Social History   Socioeconomic History  . Marital status: Married    Spouse name: Not on file  . Number of children: Not on file  . Years of education: Not on file  . Highest education level: Not on file  Occupational History  . Not on file  Social Needs  . Financial resource strain: Not on file  . Food insecurity    Worry: Not on file    Inability: Not on file  .  Transportation needs    Medical: Not on file    Non-medical: Not on file  Tobacco Use  . Smoking status: Never Smoker  . Smokeless tobacco: Never Used  Substance and Sexual Activity  . Alcohol use: No  . Drug use: No  . Sexual activity: Yes  Lifestyle  . Physical activity    Days per week: Not on file    Minutes per session: Not on file  . Stress: Not on file  Relationships  . Social Herbalist on phone: Not on file    Gets together: Not on file    Attends religious service: Not on file    Active member of club or organization: Not on file    Attends meetings of clubs or organizations: Not on file    Relationship status: Not on file  . Intimate partner violence    Fear of current or ex partner: Not on file    Emotionally abused: Not on file    Physically abused: Not on file    Forced sexual activity: Not on file  Other Topics Concern  . Not on file  Social History Narrative   ** Merged History Encounter **       Holter monitor 08/2010: PVCs and sinus tachy.   Sleep Study (02/2008): mild sleep apnea,  no indication for CPAP.    Past Surgical History:  Procedure Laterality Date  . APPENDECTOMY    . BACK SURGERY    . CARDIAC CATHETERIZATION  2007   Clean Cardiac Cath ([place]), with RCA 30% narrowing likely due to catheter induced spasm in 09/02/10.  Marland Kitchen CARDIAC CATHETERIZATION  09/02/2010   mod. nonobstructive disease in the RCA and CX, tortuous LAD  . COLONOSCOPY W/ POLYPECTOMY  03/17/11   diminutive polyp  . LOOP RECORDER EXPLANT N/A 01/14/2014   Procedure: LOOP RECORDER EXPLANT;  Surgeon: Sanda Klein, MD;  Location: [facility] CATH LAB;  Service: Cardiovascular;  Laterality: N/A;  . LOOP RECORDER IMPLANT  04/06/2012   Reveal XT [id number]  . LOOP RECORDER IMPLANT N/A 04/06/2012   Procedure: LOOP RECORDER IMPLANT;  Surgeon: Sanda Klein, MD;  Location: [facility] CATH LAB;  Service: Cardiovascular;  Laterality: N/A;  . NM MYOCAR PERF WALL MOTION  01/25/2008   mild  anteroapical wall ischemia  . PATENT DUCTUS ARTERIOUS REPAIR     at age 69    Family History  Problem Relation Age of Onset  . Colon cancer Paternal Grandfather   . Prostate cancer Paternal Grandfather   . Aneurysm Father   . Heart attack Father 71  . Coronary artery disease Father   . Colon polyps Mother   . Heart disease Mother   . Coronary artery disease Mother   . Migraines Mother   . Breast cancer Maternal Grandmother   . Colon cancer Paternal Uncle        x 2  . Stomach cancer Brother   . Liver disease Other        unsure who it was  . Allergies Daughter     Allergies  Allergen Reactions  . Benadryl [Diphenhydramine Hcl]     "drives me nuts"    Current Outpatient Medications on File Prior to Visit  Medication Sig Dispense Refill  . albuterol (PROVENTIL) (2.5 MG/3ML) 0.083% nebulizer solution USE 1 VIAL VIA NEBULIZER  EVERY 6 HOURS AS NEEDED FOR WHEEZING OR SHORTNESS OF  BREATH (Patient taking differently: Take 2.5 mg by nebulization every 6 (six) hours as needed for wheezing. ) 1125 mL 0  . Ascorbic Acid (VITAMIN C) 1000 MG tablet Take 1,000 mg by mouth daily.    Marland Kitchen aspirin EC 81 MG EC tablet Take 1 tablet (81 mg total) by mouth daily. 30 tablet 0  . atorvastatin (LIPITOR) 80 MG tablet TAKE 1 TABLET BY MOUTH ONCE DAILY (Patient taking differently: Take 80 mg by mouth daily at 6 PM. ) 90 tablet 3  . cetirizine (ZYRTEC) 10 MG tablet Take 10 mg by mouth daily.    . clonazePAM (KLONOPIN) 1 MG tablet Take 1 tablet (1 mg total) by mouth daily. 90 tablet 0  . diclofenac sodium (VOLTAREN) 1 % GEL Apply 2 g topically 4 (four) times daily as needed (pain). 720 g 0  . DULoxetine (CYMBALTA) 30 MG capsule TAKE 1 CAPSULE BY MOUTH  DAILY (Patient taking differently: Take 30 mg by mouth daily. ) 90 capsule 1  . ezetimibe (ZETIA) 10 MG tablet TAKE 1 TABLET BY MOUTH  DAILY (Patient taking differently: Take 10 mg by mouth daily. ) 90 tablet 1  . fluticasone (FLONASE) 50 MCG/ACT nasal spray  Place 2 sprays into both nostrils as needed for allergies or rhinitis.    Marland Kitchen gabapentin (NEURONTIN) 300 MG capsule TAKE 1 CAPSULE BY MOUTH AT  6AM, 11:30AM AND 4:30PM AND TAKE 3 CAPSULES AT 10:00PM (  Patient taking differently: Take 300-900 mg by mouth See admin instructions. Take 1 capsule at 6 am, 11:30 am and 4:30 pm then take 3 capsules at 10 pm) 540 capsule 3  . Icosapent Ethyl (VASCEPA) 1 g CAPS Take 2 capsules (2 g total) by mouth 2 (two) times daily. 120 capsule 0  . isosorbide mononitrate (IMDUR) 30 MG 24 hr tablet Take 0.5 tablets (15 mg total) by mouth daily. 30 tablet 6  . ketoconazole (NIZORAL) 2 % shampoo APPLY TOPICALLY DAILY AS  DIRECTED (Patient not taking: Reported on 05/28/2019) 360 mL 1  . lisinopril (PRINIVIL,ZESTRIL) 5 MG tablet Take 1 tablet (5 mg total) by mouth daily. (Patient not taking: Reported on 05/28/2019) 30 tablet 0  . lisinopril (ZESTRIL) 10 MG tablet TAKE 1 TABLET BY MOUTH  DAILY (Patient not taking: Reported on 05/28/2019) 90 tablet 1  . lisinopril (ZESTRIL) 10 MG tablet Take 1 tablet (10 mg total) by mouth daily. 30 tablet 0  . meclizine (ANTIVERT) 25 MG tablet Take 1 tablet (25 mg total) by mouth 3 (three) times daily as needed for dizziness. 30 tablet 0  . meloxicam (MOBIC) 7.5 MG tablet TAKE 1 TABLET BY MOUTH  DAILY AS NEEDED FOR PAIN (Patient taking differently: Take 7.5 mg by mouth daily as needed for pain. ) 90 tablet 1  . metFORMIN (GLUCOPHAGE-XR) 500 MG 24 hr tablet TAKE 2 TABLETS BY MOUTH  DAILY WITH BREAKFAST (Patient taking differently: Take 500 mg by mouth 2 (two) times daily. ) 180 tablet 3  . metoprolol tartrate (LOPRESSOR) 50 MG tablet Take 1 tablet (50 mg total) by mouth 2 (two) times daily. 180 tablet 3  . montelukast (SINGULAIR) 10 MG tablet TAKE 1 TABLET BY MOUTH AT  BEDTIME (Patient taking differently: Take 10 mg by mouth at bedtime. ) 90 tablet 3  . Multiple Vitamin (MULTIVITAMIN WITH MINERALS) TABS tablet Take 1 tablet by mouth daily.    .  nitroGLYCERIN (NITROSTAT) 0.4 MG SL tablet Place 1 tablet (0.4 mg total) under the tongue every 5 (five) minutes as needed for chest pain. 30 tablet 1  . ONETOUCH VERIO test strip USE WITH METER TO CHECK  BLOOD SUGAR 3 TIMES DAILY 300 each 3  . tamsulosin (FLOMAX) 0.4 MG CAPS capsule Take 1 capsule (0.4 mg total) by mouth daily. 90 capsule 0  . tiZANidine (ZANAFLEX) 2 MG tablet TAKE 1 TABLET BY MOUTH  EVERY 6 HOURS AS NEEDED FOR MUSCLE SPASM(S) (Patient taking differently: Take 2 mg by mouth every 6 (six) hours as needed for muscle spasms. ) 270 tablet 0  . traMADol (ULTRAM) 50 MG tablet TAKE 1 TABLET BY MOUTH  EVERY 6 HOURS AS NEEDED FOR SEVERE PAIN (Patient taking differently: Take 50 mg by mouth every 6 (six) hours as needed for moderate pain. ) 120 tablet 0  . traZODone (DESYREL) 100 MG tablet TAKE 1 TABLET BY MOUTH AT  BEDTIME (Patient taking differently: Take 100 mg by mouth at bedtime. ) 90 tablet 1  . zolpidem (AMBIEN) 10 MG tablet TAKE 1 TABLET BY MOUTH AT  BEDTIME AS NEEDED FOR SLEEP (Patient taking differently: Take 10 mg by mouth at bedtime as needed for sleep. ) 90 tablet 0  . [DISCONTINUED] niacin (NIASPAN) 1000 MG CR tablet Take 1 tablet (1,000 mg total) by mouth at bedtime. 30 tablet 6   No current facility-administered medications on file prior to visit.     There were no vitals taken for this visit.   Observations/Objective:  Gen: Awake, alert, no acute distress Resp: Breathing is even and non-labored Psych: calm/pleasant demeanor Neuro: Alert and Oriented x 3, + facial symmetry, speech is clear. Skin: + hypertrophic skin irritation behind the right ear- looks like eczema.  Some mild erythematous rash noted on bilateral hands/forearms   Assessment and Plan:  Skin rash- trial of diprolene cream.  Looks like eczema behind ear and ? Poison ivy on hands/arms.  Does not look like a drug rash to me. He is advised to call if rash worsens or if not improved in 1  week.  Atypical chest pain- resolved. Neg work up in ED. Advised pt to schedule follow up with his cardiologist. He verbalizes understanding.  Follow Up Instructions:    I discussed the assessment and treatment plan with the patient. The patient was provided an opportunity to ask questions and all were answered. The patient agreed with the plan and demonstrated an understanding of the instructions.   The patient was advised to call back or seek an in-person evaluation if the symptoms worsen or if the condition fails to improve as anticipated.  Nance Pear, NP

## 2019-06-03 ENCOUNTER — Encounter: Payer: Self-pay | Admitting: Family

## 2019-06-04 ENCOUNTER — Encounter: Payer: Self-pay | Admitting: Family

## 2019-06-04 MED ORDER — PREDNISONE 10 MG PO TABS: ORAL_TABLET | ORAL | 0 refills | Status: DC

## 2019-06-10 ENCOUNTER — Encounter: Payer: Medicare Other | Admitting: Physical Medicine & Rehabilitation

## 2019-06-12 ENCOUNTER — Encounter: Payer: Self-pay | Admitting: Cardiovascular Disease

## 2019-06-28 ENCOUNTER — Encounter: Payer: Medicare Other | Admitting: Physical Medicine & Rehabilitation

## 2019-07-04 ENCOUNTER — Ambulatory Visit: Payer: Self-pay | Admitting: Cardiology

## 2019-07-10 ENCOUNTER — Other Ambulatory Visit: Payer: Self-pay | Admitting: Family

## 2019-07-15 ENCOUNTER — Other Ambulatory Visit: Payer: Self-pay | Admitting: Family

## 2019-07-15 DIAGNOSIS — Z0000 Encounter for general adult medical examination without abnormal findings: Secondary | ICD-10-CM

## 2019-07-15 DIAGNOSIS — E1142 Type 2 diabetes mellitus with diabetic polyneuropathy: Secondary | ICD-10-CM

## 2019-07-16 NOTE — Telephone Encounter (Signed)
Patient scheduled for annual physical 08-06-2019. He is scheduled for 4:20 in pm. Will he need fasting labs? He can come in on Wednesday for labs if needed.

## 2019-07-16 NOTE — Telephone Encounter (Signed)
Cody Raymond -- pt's Cymbalta was refilled today. Pt has no future appts scheduled with you. When should pt follow up with you?

## 2019-07-16 NOTE — Telephone Encounter (Signed)
Patient's physical moved to 11-20 and labs scheduled for 11-17 at his convenience and request.

## 2019-07-16 NOTE — Telephone Encounter (Signed)
Let's have him do fasting labs the day before please. Orders have been placed.

## 2019-07-16 NOTE — Telephone Encounter (Signed)
Correction appt is on 11-13 and labs 11-11

## 2019-07-19 ENCOUNTER — Ambulatory Visit: Payer: Medicare Other | Admitting: Cardiovascular Disease

## 2019-07-23 ENCOUNTER — Ambulatory Visit (INDEPENDENT_AMBULATORY_CARE_PROVIDER_SITE_OTHER): Payer: Medicare Other | Admitting: Cardiovascular Disease

## 2019-07-23 ENCOUNTER — Encounter: Payer: Self-pay | Admitting: Cardiovascular Disease

## 2019-07-23 ENCOUNTER — Other Ambulatory Visit: Payer: Self-pay

## 2019-07-23 DIAGNOSIS — I2510 Atherosclerotic heart disease of native coronary artery without angina pectoris: Secondary | ICD-10-CM

## 2019-07-23 DIAGNOSIS — R002 Palpitations: Secondary | ICD-10-CM | POA: Diagnosis not present

## 2019-07-23 DIAGNOSIS — E785 Hyperlipidemia, unspecified: Secondary | ICD-10-CM | POA: Diagnosis not present

## 2019-07-23 DIAGNOSIS — I10 Essential (primary) hypertension: Secondary | ICD-10-CM

## 2019-07-23 NOTE — Assessment & Plan Note (Signed)
Occasional palpitations on beta-blockade.

## 2019-07-23 NOTE — Assessment & Plan Note (Signed)
History of nonobstructive CAD by cath in 2011 and negative Myoview in 2016.  He is on Imdur which I going to stop.

## 2019-07-23 NOTE — Assessment & Plan Note (Signed)
History of dyslipidemia on atorvastatin and Zetia with lipid profile performed 04/19/2019 revealing total cholesterol 82, LDL 36 and HDL 31.

## 2019-07-23 NOTE — Patient Instructions (Signed)
Medication Instructions:  Stop Imdur  If you need a refill on your cardiac medications before your next appointment, please call your pharmacy.   Lab work: NONE  Testing/Procedures: NONE  Follow-Up: At [facility], you and your health needs are our priority.  As part of our continuing mission to provide you with exceptional heart care, we have created designated Provider Care Teams.  These Care Teams include your primary Cardiologist (physician) and Advanced Practice Providers (APPs -  Physician Assistants and Nurse Practitioners) who all work together to provide you with the care you need, when you need it. You may see Quay Burow, MD or one of the following Advanced Practice Providers on your designated Care Team:    Kerin Ransom, PA-C  [place], [place]  Coletta Memos, [place] Your physician wants you to follow-up in: 6 months with Kerin Ransom PA and 1 year with Dr Gwenlyn Found. You will receive a reminder letter in the mail two months in advance. If you don't receive a letter, please call our office to schedule the follow-up appointment.

## 2019-07-23 NOTE — Assessment & Plan Note (Signed)
History of essential hypertension blood pressure measured today at 93/60.  He is on lisinopril and metoprolol.

## 2019-07-23 NOTE — Progress Notes (Signed)
07/23/2019 Cody Raymond   31-Jul-1971  [id number]  Primary Physician [name], NP Primary Cardiologist: Lorretta Harp MD [name], [place]  HPI:  Cody Raymond is a 48 y.o.  mild to moderately overweight married Latino male father of 2 daughters, grandfather 2 grandchildren who I last saw in the office 10/23/2018. He is referred to be established in our practice for ongoing cardiovascular care because of chest pain.  He was previously a patient of Dr. Sallyanne Kuster , then Dr. Einar Gip and now is transferring to me because of insurance issues.  His cardiac risk factors are notable for treated hypertension, diabetes and hyperlipidemia.  He is a Restaurant manager, fast food.  He is never smoked.  His mother did have CABG.  He is never had a heart attack or stroke.  Does get fairly constant daily chest pain.  He had a heart cath in 2011 that showed nonobstructive CAD in the circumflex and RCA and negative Myoview in 2016.  His mother did have HOCM and had a septal myectomy.  He has LVH without asymmetric thickening.  He has been out of work for 70 half years and has been disabled.  He has had syncope in the past with a negative work-up including loop implantation and explantation.  He was recently seen in the emergency room with chest pain 10/06/2018 with negative work-up.    He was hospitalized for 1 day 12/14/2018 with atypical chest pain and ruled out for myocardial infarction.  He sees saw Kerin Ransom in the office 04/19/2019.  His major complaint is of occasional tachycardia.  He has rare atypical chest pain and has been on Imdur.     Current Meds  Medication Sig  . albuterol (PROVENTIL) (2.5 MG/3ML) 0.083% nebulizer solution USE 1 VIAL VIA NEBULIZER  EVERY 6 HOURS AS NEEDED FOR WHEEZING OR SHORTNESS OF  BREATH (Patient taking differently: Take 2.5 mg by nebulization every 6 (six) hours as needed for wheezing. )  . Ascorbic Acid (VITAMIN C) 1000 MG tablet Take 1,000 mg by mouth daily.  Marland Kitchen  aspirin EC 81 MG EC tablet Take 1 tablet (81 mg total) by mouth daily.  Marland Kitchen atorvastatin (LIPITOR) 80 MG tablet TAKE 1 TABLET BY MOUTH ONCE DAILY (Patient taking differently: Take 80 mg by mouth daily at 6 PM. )  . betamethasone dipropionate (DIPROLENE) 0.05 % cream Apply topically 2 (two) times daily.  . cetirizine (ZYRTEC) 10 MG tablet Take 10 mg by mouth daily.  . clonazePAM (KLONOPIN) 1 MG tablet Take 1 tablet (1 mg total) by mouth daily.  . diclofenac sodium (VOLTAREN) 1 % GEL Apply 2 g topically 4 (four) times daily as needed (pain).  . DULoxetine (CYMBALTA) 30 MG capsule TAKE 1 CAPSULE BY MOUTH  DAILY  . ezetimibe (ZETIA) 10 MG tablet TAKE 1 TABLET BY MOUTH  DAILY (Patient taking differently: Take 10 mg by mouth daily. )  . fluticasone (FLONASE) 50 MCG/ACT nasal spray Place 2 sprays into both nostrils as needed for allergies or rhinitis.  Marland Kitchen gabapentin (NEURONTIN) 300 MG capsule TAKE 1 CAPSULE BY MOUTH AT  6AM, 11:30AM AND 4:30PM AND TAKE 3 CAPSULES AT 10:00PM (Patient taking differently: Take 300-900 mg by mouth See admin instructions. Take 1 capsule at 6 am, 11:30 am and 4:30 pm then take 3 capsules at 10 pm)  . Icosapent Ethyl (VASCEPA) 1 g CAPS Take 2 capsules (2 g total) by mouth 2 (two) times daily.  . isosorbide mononitrate (IMDUR) 30 MG 24 hr  tablet Take 0.5 tablets (15 mg total) by mouth daily.  Marland Kitchen ketoconazole (NIZORAL) 2 % shampoo APPLY TOPICALLY DAILY AS  DIRECTED  . lisinopril (PRINIVIL,ZESTRIL) 5 MG tablet Take 1 tablet (5 mg total) by mouth daily.  Marland Kitchen lisinopril (ZESTRIL) 10 MG tablet Take 1 tablet (10 mg total) by mouth daily.  Marland Kitchen lisinopril (ZESTRIL) 10 MG tablet TAKE 1 TABLET BY MOUTH  DAILY  . meclizine (ANTIVERT) 25 MG tablet Take 1 tablet (25 mg total) by mouth 3 (three) times daily as needed for dizziness.  . meloxicam (MOBIC) 7.5 MG tablet TAKE 1 TABLET BY MOUTH  DAILY AS NEEDED FOR PAIN (Patient taking differently: Take 7.5 mg by mouth daily as needed for pain. )  .  metFORMIN (GLUCOPHAGE-XR) 500 MG 24 hr tablet TAKE 2 TABLETS BY MOUTH  DAILY WITH BREAKFAST (Patient taking differently: Take 500 mg by mouth 2 (two) times daily. )  . metoprolol tartrate (LOPRESSOR) 50 MG tablet Take 1 tablet (50 mg total) by mouth 2 (two) times daily.  . montelukast (SINGULAIR) 10 MG tablet TAKE 1 TABLET BY MOUTH AT  BEDTIME (Patient taking differently: Take 10 mg by mouth at bedtime. )  . Multiple Vitamin (MULTIVITAMIN WITH MINERALS) TABS tablet Take 1 tablet by mouth daily.  . nitroGLYCERIN (NITROSTAT) 0.4 MG SL tablet Place 1 tablet (0.4 mg total) under the tongue every 5 (five) minutes as needed for chest pain.  Glory Rosebush VERIO test strip USE WITH METER TO CHECK  BLOOD SUGAR 3 TIMES DAILY  . predniSONE (DELTASONE) 10 MG tablet 4 tabs by mouth once daily for 2 days, then 3 tabs daily x 2 days, then 2 tabs daily x 2 days, then 1 tab daily x 2 days  . tamsulosin (FLOMAX) 0.4 MG CAPS capsule Take 1 capsule (0.4 mg total) by mouth daily.  Marland Kitchen tiZANidine (ZANAFLEX) 2 MG tablet TAKE 1 TABLET BY MOUTH  EVERY 6 HOURS AS NEEDED FOR MUSCLE SPASM(S) (Patient taking differently: Take 2 mg by mouth every 6 (six) hours as needed for muscle spasms. )  . traMADol (ULTRAM) 50 MG tablet TAKE 1 TABLET BY MOUTH  EVERY 6 HOURS AS NEEDED FOR SEVERE PAIN (Patient taking differently: Take 50 mg by mouth every 6 (six) hours as needed for moderate pain. )  . traZODone (DESYREL) 100 MG tablet TAKE 1 TABLET BY MOUTH AT  BEDTIME (Patient taking differently: Take 100 mg by mouth at bedtime. )  . zolpidem (AMBIEN) 10 MG tablet TAKE 1 TABLET BY MOUTH AT  BEDTIME AS NEEDED FOR SLEEP (Patient taking differently: Take 10 mg by mouth at bedtime as needed for sleep. )     Allergies  Allergen Reactions  . Benadryl [Diphenhydramine Hcl]     "drives me nuts"    Social History   Socioeconomic History  . Marital status: Married    Spouse name: Not on file  . Number of children: Not on file  . Years of  education: Not on file  . Highest education level: Not on file  Occupational History  . Not on file  Social Needs  . Financial resource strain: Not on file  . Food insecurity    Worry: Not on file    Inability: Not on file  . Transportation needs    Medical: Not on file    Non-medical: Not on file  Tobacco Use  . Smoking status: Never Smoker  . Smokeless tobacco: Never Used  Substance and Sexual Activity  . Alcohol use: No  .  Drug use: No  . Sexual activity: Yes  Lifestyle  . Physical activity    Days per week: Not on file    Minutes per session: Not on file  . Stress: Not on file  Relationships  . Social Herbalist on phone: Not on file    Gets together: Not on file    Attends religious service: Not on file    Active member of club or organization: Not on file    Attends meetings of clubs or organizations: Not on file    Relationship status: Not on file  . Intimate partner violence    Fear of current or ex partner: Not on file    Emotionally abused: Not on file    Physically abused: Not on file    Forced sexual activity: Not on file  Other Topics Concern  . Not on file  Social History Narrative   ** Merged History Encounter **       Holter monitor 08/2010: PVCs and sinus tachy.   Sleep Study (02/2008): mild sleep apnea, no indication for CPAP.     Review of Systems: General: negative for chills, fever, night sweats or weight changes.  Cardiovascular: negative for chest pain, dyspnea on exertion, edema, orthopnea, palpitations, paroxysmal nocturnal dyspnea or shortness of breath Dermatological: negative for rash Respiratory: negative for cough or wheezing Urologic: negative for hematuria Abdominal: negative for nausea, vomiting, diarrhea, bright red blood per rectum, melena, or hematemesis Neurologic: negative for visual changes, syncope, or dizziness All other systems reviewed and are otherwise negative except as noted above.    Blood pressure  93/60, pulse 71, temperature (!) 97.3 F (36.3 C), height 5\' 8"  (1.727 m), weight 187 lb 6.4 oz (85 kg), SpO2 98 %.  General appearance: alert and no distress Neck: no adenopathy, no carotid bruit, no JVD, supple, symmetrical, trachea midline and thyroid not enlarged, symmetric, no tenderness/mass/nodules Lungs: clear to auscultation bilaterally Heart: regular rate and rhythm, S1, S2 normal, no murmur, click, rub or gallop Extremities: extremities normal, atraumatic, no cyanosis or edema Pulses: 2+ and symmetric Skin: Skin color, texture, turgor normal. No rashes or lesions Neurologic: Alert and oriented X 3, normal strength and tone. Normal symmetric reflexes. Normal coordination and gait  EKG not performed today  ASSESSMENT AND PLAN:   CAD (coronary artery disease), non obstructive on cath 2011 History of nonobstructive CAD by cath in 2011 and negative Myoview in 2016.  He is on Imdur which I going to stop.  Palpitations Occasional palpitations on beta-blockade.  Essential hypertension History of essential hypertension blood pressure measured today at 93/60.  He is on lisinopril and metoprolol.  Dyslipidemia History of dyslipidemia on atorvastatin and Zetia with lipid profile performed 04/19/2019 revealing total cholesterol 82, LDL 36 and HDL 31.      Lorretta Harp MD [facility], [facility] 07/23/2019 11:43 AM

## 2019-07-25 ENCOUNTER — Encounter: Payer: Medicare Other | Admitting: Physical Medicine & Rehabilitation

## 2019-07-31 ENCOUNTER — Encounter: Payer: Self-pay | Admitting: Family

## 2019-07-31 ENCOUNTER — Ambulatory Visit (INDEPENDENT_AMBULATORY_CARE_PROVIDER_SITE_OTHER): Payer: Medicare Other | Admitting: Family

## 2019-07-31 ENCOUNTER — Other Ambulatory Visit: Payer: Self-pay

## 2019-07-31 ENCOUNTER — Other Ambulatory Visit: Payer: Medicare Other

## 2019-07-31 VITALS — BP 119/79 | Temp 98.7°F | Wt 185.0 lb

## 2019-07-31 DIAGNOSIS — J209 Acute bronchitis, unspecified: Secondary | ICD-10-CM | POA: Diagnosis not present

## 2019-07-31 DIAGNOSIS — R059 Cough, unspecified: Secondary | ICD-10-CM

## 2019-07-31 DIAGNOSIS — R05 Cough: Secondary | ICD-10-CM

## 2019-07-31 DIAGNOSIS — Z20822 Contact with and (suspected) exposure to covid-19: Secondary | ICD-10-CM

## 2019-07-31 MED ORDER — DOXYCYCLINE HYCLATE 100 MG PO TABS: 100.0000 mg | ORAL_TABLET | Freq: Two times a day (BID) | ORAL | 0 refills | Status: DC

## 2019-07-31 MED ORDER — PREDNISONE 10 MG PO TABS: ORAL_TABLET | ORAL | 0 refills | Status: DC

## 2019-07-31 NOTE — Progress Notes (Signed)
Virtual Visit via Video Note  I connected with Cody Raymond on 07/31/19 at 11:40 AM EST by a video enabled telemedicine application and verified that I am speaking with the correct person using two identifiers.  Location: Patient: home Provider: home   I discussed the limitations of evaluation and management by telemedicine and the availability of in person appointments. The patient expressed understanding and agreed to proceed.  History of Present Illness:  Patient is a 48 yr old male who presents today with ch/o nasal congestion and cough.   He reports that cough is productive of yellow phlegm.  He also reports a "little pain in the left lung." + HA's.  + fatigue.  Feels like his breathing is tight to his previous asthma exacerbations.  He has limited outside exposure.  However, his wife leaves the house to go to work.  Daughter stays home for virtual learning.  She is feeling well. Wife had a mild cough that she attributed to allergies.  Pt denies fever, body aches.  Reports symptoms started about 5 days.  Using mucinex with minimal improvement.  Past Medical History:  Diagnosis Date  . Aneurysm ([place])   . Asthma   . Blood transfusion   . CAD (coronary artery disease), non obstructive on cath 2011 04/05/2012  . Coronary artery disease   . Diabetes mellitus   . Diabetes mellitus without complication ([place])   . Enlarged prostate   . GERD (gastroesophageal reflux disease)   . H/O syncope   . Heart disease   . History of repair of patent ductus arteriosus 07/23/2016  . Hyperlipidemia   . Hypertension   . 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 Refusal of blood transfusions as patient is Jehovah's Witness      Social History   Socioeconomic History  . Marital status: Married    Spouse name: Not on file  . Number of children: Not on file  . Years of education: Not on file  . Highest education level: Not on file  Occupational History  . Not on file  Social Needs  .  Financial resource strain: Not on file  . Food insecurity    Worry: Not on file    Inability: Not on file  . Transportation needs    Medical: Not on file    Non-medical: Not on file  Tobacco Use  . Smoking status: Never Smoker  . Smokeless tobacco: Never Used  Substance and Sexual Activity  . Alcohol use: No  . Drug use: No  . Sexual activity: Yes  Lifestyle  . Physical activity    Days per week: Not on file    Minutes per session: Not on file  . Stress: Not on file  Relationships  . Social Herbalist on phone: Not on file    Gets together: Not on file    Attends religious service: Not on file    Active member of club or organization: Not on file    Attends meetings of clubs or organizations: Not on file    Relationship status: Not on file  . Intimate partner violence    Fear of current or ex partner: Not on file    Emotionally abused: Not on file    Physically abused: Not on file    Forced sexual activity: Not on file  Other Topics Concern  . Not on file  Social History Narrative   ** Merged History Encounter **  Holter monitor 08/2010: PVCs and sinus tachy.   Sleep Study (02/2008): mild sleep apnea, no indication for CPAP.    Past Surgical History:  Procedure Laterality Date  . APPENDECTOMY    . BACK SURGERY    . CARDIAC CATHETERIZATION  2007   Clean Cardiac Cath ([place]), with RCA 30% narrowing likely due to catheter induced spasm in 09/02/10.  Marland Kitchen CARDIAC CATHETERIZATION  09/02/2010   mod. nonobstructive disease in the RCA and CX, tortuous LAD  . COLONOSCOPY W/ POLYPECTOMY  03/17/11   diminutive polyp  . LOOP RECORDER EXPLANT N/A 01/14/2014   Procedure: LOOP RECORDER EXPLANT;  Surgeon: Sanda Klein, MD;  Location: [facility] CATH [facility];  Service: Cardiovascular;  Laterality: N/A;  . LOOP RECORDER IMPLANT  04/06/2012   Reveal XT [id number]  . LOOP RECORDER IMPLANT N/A 04/06/2012   Procedure: LOOP RECORDER IMPLANT;  Surgeon: Sanda Klein, MD;   Location: [facility] CATH [facility];  Service: Cardiovascular;  Laterality: N/A;  . NM MYOCAR PERF WALL MOTION  01/25/2008   mild anteroapical wall ischemia  . PATENT DUCTUS ARTERIOUS REPAIR     at age 89    Family History  Problem Relation Age of Onset  . Colon cancer Paternal Grandfather   . Prostate cancer Paternal Grandfather   . Aneurysm Father   . Heart attack Father 59  . Coronary artery disease Father   . Colon polyps Mother   . Heart disease Mother   . Coronary artery disease Mother   . Migraines Mother   . Breast cancer Maternal Grandmother   . Colon cancer Paternal Uncle        x 2  . Stomach cancer Brother   . Liver disease Other        unsure who it was  . Allergies Daughter     Allergies  Allergen Reactions  . Benadryl [Diphenhydramine Hcl]     "drives me nuts"    Current Outpatient Medications on File Prior to Visit  Medication Sig Dispense Refill  . albuterol (PROVENTIL) (2.5 MG/3ML) 0.083% nebulizer solution USE 1 VIAL VIA NEBULIZER  EVERY 6 HOURS AS NEEDED FOR WHEEZING OR SHORTNESS OF  BREATH (Patient taking differently: Take 2.5 mg by nebulization every 6 (six) hours as needed for wheezing. ) 1125 mL 0  . Ascorbic Acid (VITAMIN C) 1000 MG tablet Take 1,000 mg by mouth daily.    Marland Kitchen aspirin EC 81 MG EC tablet Take 1 tablet (81 mg total) by mouth daily. 30 tablet 0  . atorvastatin (LIPITOR) 80 MG tablet TAKE 1 TABLET BY MOUTH ONCE DAILY (Patient taking differently: Take 80 mg by mouth daily at 6 PM. ) 90 tablet 3  . betamethasone dipropionate (DIPROLENE) 0.05 % cream Apply topically 2 (two) times daily. 30 g 0  . cetirizine (ZYRTEC) 10 MG tablet Take 10 mg by mouth daily.    . clonazePAM (KLONOPIN) 1 MG tablet Take 1 tablet (1 mg total) by mouth daily. 90 tablet 0  . diclofenac sodium (VOLTAREN) 1 % GEL Apply 2 g topically 4 (four) times daily as needed (pain). 720 g 0  . DULoxetine (CYMBALTA) 30 MG capsule TAKE 1 CAPSULE BY MOUTH  DAILY 90 capsule 1  . ezetimibe (ZETIA) 10  MG tablet TAKE 1 TABLET BY MOUTH  DAILY (Patient taking differently: Take 10 mg by mouth daily. ) 90 tablet 1  . fluticasone (FLONASE) 50 MCG/ACT nasal spray Place 2 sprays into both nostrils as needed for allergies or rhinitis.    Marland Kitchen  gabapentin (NEURONTIN) 300 MG capsule TAKE 1 CAPSULE BY MOUTH AT  6AM, 11:30AM AND 4:30PM AND TAKE 3 CAPSULES AT 10:00PM (Patient taking differently: Take 300-900 mg by mouth See admin instructions. Take 1 capsule at 6 am, 11:30 am and 4:30 pm then take 3 capsules at 10 pm) 540 capsule 3  . Icosapent Ethyl (VASCEPA) 1 g CAPS Take 2 capsules (2 g total) by mouth 2 (two) times daily. 120 capsule 0  . ketoconazole (NIZORAL) 2 % shampoo APPLY TOPICALLY DAILY AS  DIRECTED 360 mL 1  . lisinopril (PRINIVIL,ZESTRIL) 5 MG tablet Take 1 tablet (5 mg total) by mouth daily. 30 tablet 0  . lisinopril (ZESTRIL) 10 MG tablet TAKE 1 TABLET BY MOUTH  DAILY 90 tablet 3  . meclizine (ANTIVERT) 25 MG tablet Take 1 tablet (25 mg total) by mouth 3 (three) times daily as needed for dizziness. 30 tablet 0  . meloxicam (MOBIC) 7.5 MG tablet TAKE 1 TABLET BY MOUTH  DAILY AS NEEDED FOR PAIN (Patient taking differently: Take 7.5 mg by mouth daily as needed for pain. ) 90 tablet 1  . metFORMIN (GLUCOPHAGE-XR) 500 MG 24 hr tablet TAKE 2 TABLETS BY MOUTH  DAILY WITH BREAKFAST (Patient taking differently: Take 500 mg by mouth 2 (two) times daily. ) 180 tablet 3  . metoprolol tartrate (LOPRESSOR) 50 MG tablet Take 1 tablet (50 mg total) by mouth 2 (two) times daily. 180 tablet 3  . montelukast (SINGULAIR) 10 MG tablet TAKE 1 TABLET BY MOUTH AT  BEDTIME (Patient taking differently: Take 10 mg by mouth at bedtime. ) 90 tablet 3  . Multiple Vitamin (MULTIVITAMIN WITH MINERALS) TABS tablet Take 1 tablet by mouth daily.    . nitroGLYCERIN (NITROSTAT) 0.4 MG SL tablet Place 1 tablet (0.4 mg total) under the tongue every 5 (five) minutes as needed for chest pain. 30 tablet 1  . ONETOUCH VERIO test strip USE WITH  METER TO CHECK  BLOOD SUGAR 3 TIMES DAILY 300 each 3  . tamsulosin (FLOMAX) 0.4 MG CAPS capsule Take 1 capsule (0.4 mg total) by mouth daily. 90 capsule 0  . tiZANidine (ZANAFLEX) 2 MG tablet TAKE 1 TABLET BY MOUTH  EVERY 6 HOURS AS NEEDED FOR MUSCLE SPASM(S) (Patient taking differently: Take 2 mg by mouth every 6 (six) hours as needed for muscle spasms. ) 270 tablet 0  . traMADol (ULTRAM) 50 MG tablet TAKE 1 TABLET BY MOUTH  EVERY 6 HOURS AS NEEDED FOR SEVERE PAIN (Patient taking differently: Take 50 mg by mouth every 6 (six) hours as needed for moderate pain. ) 120 tablet 0  . traZODone (DESYREL) 100 MG tablet TAKE 1 TABLET BY MOUTH AT  BEDTIME (Patient taking differently: Take 100 mg by mouth at bedtime. ) 90 tablet 1  . zolpidem (AMBIEN) 10 MG tablet TAKE 1 TABLET BY MOUTH AT  BEDTIME AS NEEDED FOR SLEEP (Patient taking differently: Take 10 mg by mouth at bedtime as needed for sleep. ) 90 tablet 0  . [DISCONTINUED] niacin (NIASPAN) 1000 MG CR tablet Take 1 tablet (1,000 mg total) by mouth at bedtime. 30 tablet 6   No current facility-administered medications on file prior to visit.     BP 119/79   Temp 98.7 F (37.1 C) (Oral)   Wt 185 lb (83.9 kg)   BMI 28.13 kg/m    Observations/Objective:   Gen: Awake, alert, no acute distress Resp: Breathing is even and non-labored Psych: calm/pleasant demeanor Neuro: Alert and Oriented x 3, +  facial symmetry, speech is clear.   Assessment and Plan:  Bronchitis with bronchospasm- will rx with prednisone taper, doxycycline, albuterol q6hrs. Will also send for covid19 screening.  Pt is advise to call if symptoms worsen or if symptoms fail to improve in the next 2-3 days.  Follow Up Instructions:    I discussed the assessment and treatment plan with the patient. The patient was provided an opportunity to ask questions and all were answered. The patient agreed with the plan and demonstrated an understanding of the instructions.   The patient  was advised to call back or seek an in-person evaluation if the symptoms worsen or if the condition fails to improve as anticipated.  Nance Pear, NP

## 2019-07-31 NOTE — Telephone Encounter (Signed)
Pt has appointment at 11:40 for virtual visit.

## 2019-08-02 ENCOUNTER — Ambulatory Visit: Payer: Medicare Other | Admitting: Family

## 2019-08-02 ENCOUNTER — Other Ambulatory Visit: Payer: Self-pay

## 2019-08-02 LAB — NOVEL CORONAVIRUS, NAA: SARS-CoV-2, NAA: NOT DETECTED

## 2019-08-06 ENCOUNTER — Encounter: Payer: Medicare Other | Admitting: Family

## 2019-08-09 ENCOUNTER — Encounter: Payer: Medicare Other | Attending: Physical Medicine & Rehabilitation | Admitting: Physical Medicine & Rehabilitation

## 2019-08-09 ENCOUNTER — Encounter: Payer: Self-pay | Admitting: Physical Medicine & Rehabilitation

## 2019-08-09 ENCOUNTER — Other Ambulatory Visit: Payer: Self-pay

## 2019-08-09 VITALS — BP 116/70 | HR 77 | Ht 68.0 in | Wt 185.0 lb

## 2019-08-09 DIAGNOSIS — M5416 Radiculopathy, lumbar region: Secondary | ICD-10-CM

## 2019-08-09 DIAGNOSIS — M961 Postlaminectomy syndrome, not elsewhere classified: Secondary | ICD-10-CM

## 2019-08-09 MED ORDER — TRAMADOL HCL ER 100 MG PO TB24: 100.0000 mg | ORAL_TABLET | Freq: Every day | ORAL | 5 refills | Status: DC

## 2019-08-09 NOTE — Patient Instructions (Signed)
Tramadol extended release tablets or capsules What is this medicine? TRAMADOL (TRA ma dole) is a pain reliever. It is used to treat moderate to severe pain in adults. This medicine may be used for other purposes; ask your health care provider or pharmacist if you have questions. COMMON BRAND NAME(S): ConZip, Ryzolt, Ultram ER What should I tell my health care provider before I take this medicine? They need to know if you have any of these conditions:  brain tumor  drink more than 3 alcohol-containing drinks per day  drug abuse or addiction  head injury  kidney disease or problems going to the bathroom  liver disease  lung disease, asthma, or breathing problems  seizures or epilepsy  an unusual or allergic reaction to tramadol, codeine, other medicines, foods, dyes, or preservatives  pregnant or trying to get pregnant  breast-feeding How should I use this medicine? Take this medicine by mouth with a glass of water. Follow the directions on the prescription label. Do not cut, crush or chew this medicine. Take this medicine the same way each day, either with or without food. If it upsets your stomach, take it with food. Take your medicine at regular intervals. Do not take it more often than directed. Do not stop taking except on your doctor's advice. A special MedGuide will be given to you by the pharmacist with each prescription and refill. Be sure to read this information carefully each time. Talk to your pediatrician regarding the use of this medicine in children. Special care may be needed. This medicine is not for use in children less than 47 years of age. Do not give this medicine to a child younger than 67 years of age after surgery to remove the tonsils and/or adenoids. Overdosage: If you think you have taken too much of this medicine contact a poison control center or emergency room at once. NOTE: This medicine is only for you. Do not share this medicine with others. What if  I miss a dose? If you miss a dose, take it as soon as you can. If it is almost time for your next dose, take only that dose. Do not take double or extra doses. What may interact with this medicine? Do not take this medication with any of the following medicines:  MAOIs like Carbex, Eldepryl, Marplan, Nardil, and Parnate This medicine may also interact with the following medications:  alcohol  antihistamines for allergy, cough and cold  certain medicines for anxiety or sleep  certain medicines for depression like amitriptyline, fluoxetine, sertraline  certain medicines for migraine headache like almotriptan, eletriptan, frovatriptan, naratriptan, rizatriptan, sumatriptan, zolmitriptan  certain medicines for seizures like carbamazepine, oxcarbazepine, phenobarbital, primidone  certain medicines that treat or prevent blood clots like warfarin  digoxin  furazolidone  general anesthetics like halothane, isoflurane, methoxyflurane, propofol  linezolid  local anesthetics like lidocaine, pramoxine, tetracaine  medicines that relax muscles for surgery  other narcotic medicines for pain or cough  phenothiazines like chlorpromazine, mesoridazine, prochlorperazine, thioridazine  procarbazine This list may not describe all possible interactions. Give your health care provider a list of all the medicines, herbs, non-prescription drugs, or dietary supplements you use. Also tell them if you smoke, drink alcohol, or use illegal drugs. Some items may interact with your medicine. What should I watch for while using this medicine? Tell your doctor or health care provider if your pain does not go away, if it gets worse, or if you have new or a different type of pain. You  may develop tolerance to the medicine. Tolerance means that you will need a higher dose of the medicine for pain relief. Tolerance is normal and is expected if you take this medicine for a long time. This medicine may cause  serious skin reactions. They can happen weeks to months after starting the medicine. Contact your health care provider right away if you notice fevers or flu-like symptoms with a rash. The rash may be red or purple and then turn into blisters or peeling of the skin. Or, you might notice a red rash with swelling of the face, lips or lymph nodes in your neck or under your arms. Do not suddenly stop taking your medicine because you may develop a severe reaction. Your body becomes used to the medicine. This does NOT mean you are addicted. Addiction is a behavior related to getting and using a drug for a non-medical reason. If you have pain, you have a medical reason to take pain medicine. Your doctor will tell you how much medicine to take. If your doctor wants you to stop the medicine, the dose will be slowly lowered over time to avoid any side effects. There are different types of narcotic medicines (opiates). If you take more than one type at the same time or if you are taking another medicine that also causes drowsiness, you may have more side effects. Give your health care provider a list of all medicines you use. Your doctor will tell you how much medicine to take. Do not take more medicine than directed. Call emergency for help if you have problems breathing or unusual sleepiness. You may get drowsy or dizzy. Do not drive, use machinery, or do anything that needs mental alertness until you know how this medicine affects you. Do not stand or sit up quickly, especially if you are an older patient. This reduces the risk of dizzy or fainting spells. Alcohol can increase or decrease the effects of this medicine. Avoid alcoholic drinks. You may have constipation. Try to have a bowel movement at least every 2 to 3 days. If you do not have a bowel movement for 3 days, call your doctor or health care provider. Your mouth may get dry. Chewing sugarless gum or sucking hard candy, and drinking plenty of water may help.  Contact your doctor if the problem does not go away or is severe. What side effects may I notice from receiving this medicine? Side effects that you should report to your doctor or health care professional as soon as possible:  allergic reactions like skin rash, itching or hives, swelling of the face, lips, or tongue  breathing problems  confusion  redness, blistering, peeling or loosening of the skin, including inside the mouth  seizures  signs and symptoms of low blood pressure like dizziness; feeling faint or lightheaded, falls; unusually weak or tired  trouble passing urine or change in the amount of urine Side effects that usually do not require medical attention (report to your doctor or health care professional if they continue or are bothersome):  constipation  dry mouth  nausea, vomiting  tiredness This list may not describe all possible side effects. Call your doctor for medical advice about side effects. You may report side effects to FDA at 1-800-FDA-1088. Where should I keep my medicine? Keep out of the reach of children. This medicine may cause accidental overdose and death if it taken by other adults, children, or pets. Mix any unused medicine with a substance like cat litter or  coffee grounds. Then throw the medicine away in a sealed container like a sealed bag or a coffee can with a lid. Do not use the medicine after the expiration date. Store at room temperature between 15 and 30 degrees C (59 and 86 degrees F). Keep container tightly closed. NOTE: This sheet is a summary. It may not cover all possible information. If you have questions about this medicine, talk to your doctor, pharmacist, or health care provider.  2020 Elsevier/Gold Standard (2018-12-14 15:53:18)

## 2019-08-09 NOTE — Progress Notes (Signed)
Subjective:    Patient ID: Cody Raymond, male    DOB: [birth year], 48 y.o.   MRN: [id number]  06/03/2011 Decompressive laminectomy as well as microdiscectomy and foraminotomies right L4-L5. Had preoperative ankle dorsiflexor and toe extensor weakness. Dr Gladstone Lighter and [place] Patient was given permanent restrictions by orthopedics no lifting greater than 5 pounds no stooping or bending He has undergone right L4 selective nerve root block 09/06/2013, good short-term improvement in pain down the right lower extremity HPI  CC Back pain and Right chronic L4 radic Sometimes takes tramadol during the day when he is at home He take 2 tramadol a day if he is driving ,  He states that the tramadol does not stay in his system very long and feels when it wears off.  We discussed the longer acting version of tramadol and he is interested in trying this.  We discussed the different dosing and the 100 mg dose appears to be most appropriate He is on trazodone as well as low-dose Cymbalta but has not had any serotonin syndrome type symptoms with his current dose of tramadol  Takes Gabapentin 300mg  TID and 900mg  qhs  Walks 106min 3 times a week  Sees Dr Gwenlyn Found from cardiology, now off Imdur, BP improving, still using sl NTG as needed Pain Inventory Average Pain 7 Pain Right Now 7 My pain is sharp, aching and heavy  In the last 24 hours, has pain interfered with the following? General activity 7 Relation with others 7 Enjoyment of life 7 What TIME of day is your pain at its worst? morning Sleep (in general) Poor  Pain is worse with: walking, bending, sitting, inactivity, standing and some activites Pain improves with: rest, heat/ice and medication Relief from Meds: 4  Mobility use a cane ability to climb steps?  no do you drive?  yes  Function disabled: date disabled . I need assistance with the following:  bathing  Neuro/Psych bowel control problems weakness numbness tingling  trouble walking spasms depression anxiety  Prior Studies Any changes since last visit?  no  Physicians involved in your care Any changes since last visit?  no   Family History  Problem Relation Age of Onset  . Colon cancer Paternal Grandfather   . Prostate cancer Paternal Grandfather   . Aneurysm Father   . Heart attack Father 9  . Coronary artery disease Father   . Colon polyps Mother   . Heart disease Mother   . Coronary artery disease Mother   . Migraines Mother   . Breast cancer Maternal Grandmother   . Colon cancer Paternal Uncle        x 2  . Stomach cancer Brother   . Liver disease Other        unsure who it was  . Allergies Daughter    Social History   Socioeconomic History  . Marital status: Married    Spouse name: Not on file  . Number of children: Not on file  . Years of education: Not on file  . Highest education level: Not on file  Occupational History  . Not on file  Social Needs  . Financial resource strain: Not on file  . Food insecurity    Worry: Not on file    Inability: Not on file  . Transportation needs    Medical: Not on file    Non-medical: Not on file  Tobacco Use  . Smoking status: Never Smoker  . Smokeless tobacco: Never Used  Substance and  Sexual Activity  . Alcohol use: No  . Drug use: No  . Sexual activity: Yes  Lifestyle  . Physical activity    Days per week: Not on file    Minutes per session: Not on file  . Stress: Not on file  Relationships  . Social Herbalist on phone: Not on file    Gets together: Not on file    Attends religious service: Not on file    Active member of club or organization: Not on file    Attends meetings of clubs or organizations: Not on file    Relationship status: Not on file  Other Topics Concern  . Not on file  Social History Narrative   ** Merged History Encounter **       Holter monitor 08/2010: PVCs and sinus tachy.   Sleep Study (02/2008): mild sleep apnea, no  indication for CPAP.   Past Surgical History:  Procedure Laterality Date  . APPENDECTOMY    . BACK SURGERY    . CARDIAC CATHETERIZATION  2007   Clean Cardiac Cath ([place]), with RCA 30% narrowing likely due to catheter induced spasm in 09/02/10.  [facility] Kitchen CARDIAC CATHETERIZATION  09/02/2010   mod. nonobstructive disease in the RCA and CX, tortuous LAD  . COLONOSCOPY W/ POLYPECTOMY  03/17/11   diminutive polyp  . LOOP RECORDER EXPLANT N/A 01/14/2014   Procedure: LOOP RECORDER EXPLANT;  Surgeon: Sanda Klein, MD;  Location: [facility];  Service: Cardiovascular;  Laterality: N/A;  . LOOP RECORDER IMPLANT  04/06/2012   Reveal XT [id number]  . LOOP RECORDER IMPLANT N/A 04/06/2012   Procedure: LOOP RECORDER IMPLANT;  Surgeon: Sanda Klein, MD;  Location: [facility];  Service: Cardiovascular;  Laterality: N/A;  . NM MYOCAR PERF WALL MOTION  01/25/2008   mild anteroapical wall ischemia  . PATENT DUCTUS ARTERIOUS REPAIR     at age 79   Past Medical History:  Diagnosis Date  . Aneurysm ([place])   . Asthma   . Blood transfusion   . CAD (coronary artery disease), non obstructive on cath 2011 04/05/2012  . Coronary artery disease   . Diabetes mellitus   . Diabetes mellitus without complication ([place])   . Enlarged prostate   . GERD (gastroesophageal reflux disease)   . H/O syncope   . Heart disease   . History of repair of patent ductus arteriosus 07/23/2016  . Hyperlipidemia   . Hypertension   . 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 Refusal of blood transfusions as patient is Jehovah's Witness    There were no vitals taken for this visit.  Opioid Risk Score:   Fall Risk Score:  `1  Depression screen PHQ 2/9  Depression screen [facility] 2/9 07/31/2018 04/25/2018 02/01/2018 02/01/2018 01/10/2018 07/25/2017 06/08/2016  Decreased Interest 2 2 2 2 2 2 1   Down, Depressed, Hopeless 2 1 2 2 2 2 1   PHQ - 2 Score 4 3 4 4 4 4 2   Altered sleeping 2 2 - - 2 3 3   Tired, decreased energy 2 2 - - 2 2 1    Change in appetite 1 1 - - 2 2 0  Feeling bad or failure about yourself  2 2 - - 2 2 (No Data)  Trouble concentrating 2 1 - - 1 2 (No Data)  Moving slowly or fidgety/restless 1 0 - - 2 1 (No Data)  Suicidal thoughts 0 0 - - 0 0 0  PHQ-9 Score  14 11 - - 15 16 6   Difficult doing work/chores Very difficult Very difficult - - Somewhat difficult - Somewhat difficult  Some recent data might be hidden     Review of Systems  Constitutional: Negative.   HENT: Negative.   Eyes: Negative.   Respiratory: Positive for cough, shortness of breath and wheezing.   Cardiovascular: Negative.   Gastrointestinal: Negative.   Endocrine: Negative.   Genitourinary: Negative.   Musculoskeletal: Positive for arthralgias, back pain and myalgias.  Skin: Negative.   Allergic/Immunologic: Negative.   Neurological: Negative.   Hematological: Negative.   Psychiatric/Behavioral: Negative.   All other systems reviewed and are negative.      Objective:   Physical Exam Vitals signs and nursing note reviewed.  Constitutional:      Appearance: Normal appearance.  Eyes:     Extraocular Movements: Extraocular movements intact.     Conjunctiva/sclera: Conjunctivae normal.     Pupils: Pupils are equal, round, and reactive to light.  Neurological:     Mental Status: He is alert and oriented to person, place, and time. Mental status is at baseline.  Psychiatric:        Mood and Affect: Mood normal.        Behavior: Behavior normal.        Thought Content: Thought content normal.        Judgment: Judgment normal.    Mostly deferred due to virtual  Web ex visit The patient has no evidence of dysarthria       Assessment & Plan:  #1.  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  Continue gabapentin 300 mg 3 times daily and 900 mg at night 2.  Chronic low back pain related to above change tramadol from 50 mg twice daily or 3 times daily to tramadol ER 100 mg daily.  Given that his total  tramadol dose has not increased and in fact may be a little lower, do not feel that this will increase his risk of serotonin syndrome. #3.  Fibromyalgia syndrome we discussed recommendations for moderate physical activity.  He is currently walking approximately 45 minutes/week.  We discussed the goal of 150 minutes/week.  The recommendation is to gradually increase no more than 30 minutes/week.    Web ex 23min  Physical medicine rehab follow-up in 6 months

## 2019-08-11 ENCOUNTER — Encounter: Payer: Self-pay | Admitting: Family

## 2019-08-13 ENCOUNTER — Ambulatory Visit: Payer: Medicare Other | Admitting: Family

## 2019-08-14 ENCOUNTER — Other Ambulatory Visit: Payer: Self-pay | Admitting: Family

## 2019-08-19 ENCOUNTER — Other Ambulatory Visit: Payer: Self-pay

## 2019-08-21 ENCOUNTER — Encounter: Payer: Self-pay | Admitting: Family

## 2019-08-21 ENCOUNTER — Ambulatory Visit (INDEPENDENT_AMBULATORY_CARE_PROVIDER_SITE_OTHER): Payer: Medicare Other | Admitting: Family

## 2019-08-21 ENCOUNTER — Other Ambulatory Visit: Payer: Self-pay

## 2019-08-21 VITALS — BP 95/64 | HR 67 | Temp 96.6°F | Resp 16 | Wt 183.8 lb

## 2019-08-21 DIAGNOSIS — F32A Depression, unspecified: Secondary | ICD-10-CM

## 2019-08-21 DIAGNOSIS — Z23 Encounter for immunization: Secondary | ICD-10-CM

## 2019-08-21 DIAGNOSIS — Z0000 Encounter for general adult medical examination without abnormal findings: Secondary | ICD-10-CM | POA: Diagnosis not present

## 2019-08-21 DIAGNOSIS — R350 Frequency of micturition: Secondary | ICD-10-CM

## 2019-08-21 DIAGNOSIS — F329 Major depressive disorder, single episode, unspecified: Secondary | ICD-10-CM | POA: Diagnosis not present

## 2019-08-21 DIAGNOSIS — E1142 Type 2 diabetes mellitus with diabetic polyneuropathy: Secondary | ICD-10-CM | POA: Diagnosis not present

## 2019-08-21 LAB — CBC WITH DIFFERENTIAL/PLATELET
Basophils Absolute: 0 10*3/uL (ref 0.0–0.1)
Basophils Relative: 0.5 % (ref 0.0–3.0)
Eosinophils Absolute: 0.1 10*3/uL (ref 0.0–0.7)
Eosinophils Relative: 0.8 % (ref 0.0–5.0)
HCT: 40.9 % (ref 39.0–52.0)
Hemoglobin: 13.9 g/dL (ref 13.0–17.0)
Lymphocytes Relative: 29.5 % (ref 12.0–46.0)
Lymphs Abs: 2 10*3/uL (ref 0.7–4.0)
MCHC: 34 g/dL (ref 30.0–36.0)
MCV: 81.2 fl (ref 78.0–100.0)
Monocytes Absolute: 0.5 10*3/uL (ref 0.1–1.0)
Monocytes Relative: 7.1 % (ref 3.0–12.0)
Neutro Abs: 4.2 10*3/uL (ref 1.4–7.7)
Neutrophils Relative %: 62.1 % (ref 43.0–77.0)
Platelets: 266 10*3/uL (ref 150.0–400.0)
RBC: 5.04 Mil/uL (ref 4.22–5.81)
RDW: 17.4 % — ABNORMAL HIGH (ref 11.5–15.5)
WBC: 6.8 10*3/uL (ref 4.0–10.5)

## 2019-08-21 LAB — HEPATIC FUNCTION PANEL
ALT: 13 U/L (ref 0–53)
AST: 13 U/L (ref 0–37)
Albumin: 4.4 g/dL (ref 3.5–5.2)
Alkaline Phosphatase: 75 U/L (ref 39–117)
Bilirubin, Direct: 0.1 mg/dL (ref 0.0–0.3)
Total Bilirubin: 0.5 mg/dL (ref 0.2–1.2)
Total Protein: 6.8 g/dL (ref 6.0–8.3)

## 2019-08-21 LAB — HEMOGLOBIN A1C: Hgb A1c MFr Bld: 5.8 % (ref 4.6–6.5)

## 2019-08-21 LAB — BASIC METABOLIC PANEL
BUN: 16 mg/dL (ref 6–23)
CO2: 24 mEq/L (ref 19–32)
Calcium: 9.2 mg/dL (ref 8.4–10.5)
Chloride: 105 mEq/L (ref 96–112)
Creatinine, Ser: 0.69 mg/dL (ref 0.40–1.50)
GFR: 121.98 mL/min (ref >60.00)
Glucose, Bld: 91 mg/dL (ref 70–99)
Potassium: 4.1 mEq/L (ref 3.5–5.1)
Sodium: 138 mEq/L (ref 135–145)

## 2019-08-21 LAB — LIPID PANEL
Cholesterol: 154 mg/dL (ref 0–200)
HDL: 38.2 mg/dL — ABNORMAL LOW (ref >39.00)
LDL Cholesterol: 77 mg/dL (ref 0–99)
NonHDL: 115.86
Total CHOL/HDL Ratio: 4
Triglycerides: 192 mg/dL — ABNORMAL HIGH (ref 0.0–149.0)
VLDL: 38.4 mg/dL (ref 0.0–40.0)

## 2019-08-21 LAB — TSH: TSH: 1.11 u[IU]/mL (ref 0.35–4.50)

## 2019-08-21 MED ORDER — SILVER SULFADIAZINE 1 % EX CREA: 1.0000 "application " | TOPICAL_CREAM | Freq: Every day | CUTANEOUS | 0 refills | Status: DC

## 2019-08-21 NOTE — Patient Instructions (Signed)
Please complete lab work prior to leaving.   

## 2019-08-21 NOTE — Progress Notes (Signed)
Subjective:    Patient ID: Cody Raymond, male    DOB: Sep 23, 1970, 48 y.o.   MRN: [id number]  HPI   Patient presents today for complete physical.  Immunizations: flu shot up to date, pneumovax 23 booster shot due Diet: Reports that he has lost what he is eating.  Wt Readings from Last 3 Encounters:  08/21/19 183 lb 12.8 oz (83.4 kg)  08/09/19 185 lb (83.9 kg)  07/31/19 185 lb (83.9 kg)  Exercise: walks Dental: not up to date  Burn- reports that he had some hot butter splash onto his right inner wrist 5 days ago.  Initially the area was painful.  He has been applying bacitracin ointment to the open area.  Reports increased anxiety recently due to being stuck at home during the pandemic.  States that he has been using his clonazepam 1/2 tab in AM and 1 full tab at night.  He continues Cymbalta.   Review of Systems  Constitutional: Negative for unexpected weight change.  HENT: Positive for hearing loss (feels like left ear hearing is diminished). Negative for rhinorrhea (mild congestion- improving. ).   Eyes: Negative for visual disturbance.  Respiratory: Positive for shortness of breath (due to asthma). Negative for cough.   Cardiovascular: Positive for chest pain (reports chronic chest pain, at baseline- he is using nitro prn with improvement).  Gastrointestinal: Positive for diarrhea (intermittent- believes due to metformin). Negative for constipation.  Genitourinary: Negative for dysuria and frequency.  Musculoskeletal: Positive for back pain.  Skin:       Burn on right forearm  Neurological: Positive for headaches (has had some mild HA's the last few days).  Hematological: Negative for adenopathy.  Psychiatric/Behavioral:       Denies depression   Past Medical History:  Diagnosis Date  . Aneurysm ([place])   . Asthma   . Blood transfusion   . CAD (coronary artery disease), non obstructive on cath 2011 04/05/2012  . Coronary artery disease   . Diabetes mellitus   .  Diabetes mellitus without complication ([place])   . Enlarged prostate   . GERD (gastroesophageal reflux disease)   . H/O syncope   . Heart disease   . History of repair of patent ductus arteriosus 07/23/2016  . Hyperlipidemia   . Hypertension   . 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 Refusal of blood transfusions as patient is Jehovah's Witness      Social History   Socioeconomic History  . Marital status: Married    Spouse name: Not on file  . Number of children: Not on file  . Years of education: Not on file  . Highest education level: Not on file  Occupational History  . Not on file  Social Needs  . Financial resource strain: Not on file  . Food insecurity    Worry: Not on file    Inability: Not on file  . Transportation needs    Medical: Not on file    Non-medical: Not on file  Tobacco Use  . Smoking status: Never Smoker  . Smokeless tobacco: Never Used  Substance and Sexual Activity  . Alcohol use: No  . Drug use: No  . Sexual activity: Yes  Lifestyle  . Physical activity    Days per week: Not on file    Minutes per session: Not on file  . Stress: Not on file  Relationships  . Social Herbalist on phone: Not on file    Gets  together: Not on file    Attends religious service: Not on file    Active member of club or organization: Not on file    Attends meetings of clubs or organizations: Not on file    Relationship status: Not on file  . Intimate partner violence    Fear of current or ex partner: Not on file    Emotionally abused: Not on file    Physically abused: Not on file    Forced sexual activity: Not on file  Other Topics Concern  . Not on file  Social History Narrative   ** Merged History Encounter **       Holter monitor 08/2010: PVCs and sinus tachy.   Sleep Study (02/2008): mild sleep apnea, no indication for CPAP.    Past Surgical History:  Procedure Laterality Date  . APPENDECTOMY    . BACK SURGERY    . CARDIAC CATHETERIZATION   2007   Clean Cardiac Cath ([place]), with RCA 30% narrowing likely due to catheter induced spasm in 09/02/10.  Marland Kitchen CARDIAC CATHETERIZATION  09/02/2010   mod. nonobstructive disease in the RCA and CX, tortuous LAD  . COLONOSCOPY W/ POLYPECTOMY  03/17/11   diminutive polyp  . LOOP RECORDER EXPLANT N/A 01/14/2014   Procedure: LOOP RECORDER EXPLANT;  Surgeon: Sanda Klein, MD;  Location: [facility];  Service: Cardiovascular;  Laterality: N/A;  . LOOP RECORDER IMPLANT  04/06/2012   Reveal XT [id number]  . LOOP RECORDER IMPLANT N/A 04/06/2012   Procedure: LOOP RECORDER IMPLANT;  Surgeon: Sanda Klein, MD;  Location: [facility] C[facility];  Service: Cardiovascular;  Laterality: N/A;  . NM MYOCAR PERF WALL MOTION  01/25/2008   mild anteroapical wall ischemia  . PATENT DUCTUS ARTERIOUS REPAIR     at age 60    Family History  Problem Relation Age of Onset  . Colon cancer Paternal Grandfather   . Prostate cancer Paternal Grandfather   . Aneurysm Father   . Heart attack Father 1  . Coronary artery disease Father   . Colon polyps Mother   . Heart disease Mother   . Coronary artery disease Mother   . Migraines Mother   . Breast cancer Maternal Grandmother   . Colon cancer Paternal Uncle        x 2  . Stomach cancer Brother   . Liver disease Other        unsure who it was  . Allergies Daughter     Allergies  Allergen Reactions  . Benadryl [Diphenhydramine Hcl]     "drives me nuts"    Current Outpatient Medications on File Prior to Visit  Medication Sig Dispense Refill  . albuterol (PROVENTIL) (2.5 MG/3ML) 0.083% nebulizer solution USE 1 VIAL VIA NEBULIZER  EVERY 6 HOURS AS NEEDED FOR WHEEZING OR SHORTNESS OF  BREATH (Patient taking differently: Take 2.5 mg by nebulization every 6 (six) hours as needed for wheezing. ) 1125 mL 0  . Ascorbic Acid (VITAMIN C) 1000 MG tablet Take 1,000 mg by mouth daily.    Marland Kitchen aspirin EC 81 MG EC tablet Take 1 tablet (81 mg total) by mouth daily. 30 tablet 0  .  atorvastatin (LIPITOR) 80 MG tablet TAKE 1 TABLET BY MOUTH ONCE DAILY (Patient taking differently: Take 80 mg by mouth daily at 6 PM. ) 90 tablet 3  . betamethasone dipropionate (DIPROLENE) 0.05 % cream Apply topically 2 (two) times daily. 30 g 0  . cetirizine (ZYRTEC) 10 MG tablet Take 10 mg by  mouth daily.    . clonazePAM (KLONOPIN) 1 MG tablet Take 1 tablet (1 mg total) by mouth daily. 90 tablet 0  . diclofenac sodium (VOLTAREN) 1 % GEL Apply 2 g topically 4 (four) times daily as needed (pain). 720 g 0  . doxycycline (VIBRA-TABS) 100 MG tablet Take 1 tablet (100 mg total) by mouth 2 (two) times daily. 20 tablet 0  . DULoxetine (CYMBALTA) 30 MG capsule TAKE 1 CAPSULE BY MOUTH  DAILY 90 capsule 1  . ezetimibe (ZETIA) 10 MG tablet TAKE 1 TABLET BY MOUTH  DAILY (Patient taking differently: Take 10 mg by mouth daily. ) 90 tablet 1  . fluticasone (FLONASE) 50 MCG/ACT nasal spray Place 2 sprays into both nostrils as needed for allergies or rhinitis.    Marland Kitchen gabapentin (NEURONTIN) 300 MG capsule TAKE 1 CAPSULE BY MOUTH AT  6AM, 11:30AM AND 4:30PM AND TAKE 3 CAPSULES AT 10:00PM (Patient taking differently: Take 300-900 mg by mouth See admin instructions. Take 1 capsule at 6 am, 11:30 am and 4:30 pm then take 3 capsules at 10 pm) 540 capsule 3  . Icosapent Ethyl (VASCEPA) 1 g CAPS Take 2 capsules (2 g total) by mouth 2 (two) times daily. 120 capsule 0  . ketoconazole (NIZORAL) 2 % shampoo APPLY TOPICALLY DAILY AS  DIRECTED 360 mL 1  . lisinopril (PRINIVIL,ZESTRIL) 5 MG tablet Take 1 tablet (5 mg total) by mouth daily. 30 tablet 0  . lisinopril (ZESTRIL) 10 MG tablet TAKE 1 TABLET BY MOUTH  DAILY 90 tablet 3  . meclizine (ANTIVERT) 25 MG tablet Take 1 tablet (25 mg total) by mouth 3 (three) times daily as needed for dizziness. 30 tablet 0  . meloxicam (MOBIC) 7.5 MG tablet TAKE 1 TABLET BY MOUTH  DAILY AS NEEDED FOR PAIN (Patient taking differently: Take 7.5 mg by mouth daily as needed for pain. ) 90 tablet 1   . metFORMIN (GLUCOPHAGE-XR) 500 MG 24 hr tablet TAKE 2 TABLETS BY MOUTH  DAILY WITH BREAKFAST (Patient taking differently: Take 500 mg by mouth 2 (two) times daily. ) 180 tablet 3  . metoprolol tartrate (LOPRESSOR) 50 MG tablet Take 1 tablet (50 mg total) by mouth 2 (two) times daily. 180 tablet 3  . montelukast (SINGULAIR) 10 MG tablet TAKE 1 TABLET BY MOUTH AT  BEDTIME (Patient taking differently: Take 10 mg by mouth at bedtime. ) 90 tablet 3  . Multiple Vitamin (MULTIVITAMIN WITH MINERALS) TABS tablet Take 1 tablet by mouth daily.    . nitroGLYCERIN (NITROSTAT) 0.4 MG SL tablet Place 1 tablet (0.4 mg total) under the tongue every 5 (five) minutes as needed for chest pain. 30 tablet 1  . ONETOUCH VERIO test strip USE WITH METER TO CHECK  BLOOD SUGAR 3 TIMES DAILY 300 each 3  . predniSONE (DELTASONE) 10 MG tablet 4 tabs by mouth once daily for 2 days, then 3 tabs daily x 2 days, then 2 tabs daily x 2 days, then 1 tab daily x 2 days 20 tablet 0  . tamsulosin (FLOMAX) 0.4 MG CAPS capsule TAKE 1 CAPSULE BY MOUTH  DAILY 90 capsule 3  . tiZANidine (ZANAFLEX) 2 MG tablet TAKE 1 TABLET BY MOUTH  EVERY 6 HOURS AS NEEDED FOR MUSCLE SPASM(S) (Patient taking differently: Take 2 mg by mouth every 6 (six) hours as needed for muscle spasms. ) 270 tablet 0  . traMADol (ULTRAM-ER) 100 MG 24 hr tablet Take 1 tablet (100 mg total) by mouth daily. 30 tablet 5  .  traZODone (DESYREL) 100 MG tablet TAKE 1 TABLET BY MOUTH AT  BEDTIME (Patient taking differently: Take 100 mg by mouth at bedtime. ) 90 tablet 1  . zolpidem (AMBIEN) 10 MG tablet TAKE 1 TABLET BY MOUTH AT  BEDTIME AS NEEDED FOR SLEEP (Patient taking differently: Take 10 mg by mouth at bedtime as needed for sleep. ) 90 tablet 0  . [DISCONTINUED] niacin (NIASPAN) 1000 MG CR tablet Take 1 tablet (1,000 mg total) by mouth at bedtime. 30 tablet 6   No current facility-administered medications on file prior to visit.     BP 95/64 (BP Location: Right Arm, Patient  Position: Sitting, Cuff Size: Small)   Pulse 67   Temp (!) 96.6 F (35.9 C) (Temporal)   Resp 16   Wt 183 lb 12.8 oz (83.4 kg)   SpO2 99%   BMI 27.95 kg/m       Objective:   Physical Exam Constitutional:      General: He is not in acute distress.    Appearance: He is well-developed.  HENT:     Head: Normocephalic and atraumatic.  Cardiovascular:     Rate and Rhythm: Normal rate and regular rhythm.     Heart sounds: No murmur.  Pulmonary:     Effort: Pulmonary effort is normal. No respiratory distress.     Breath sounds: Normal breath sounds. No wheezing or rales.  Abdominal:     General: Bowel sounds are normal.     Palpations: Abdomen is soft.     Tenderness: There is no abdominal tenderness.  Skin:    General: Skin is warm and dry.     Comments: First degree burns noted on the right forearm with a small area of second degree burn on right inner wrist and a splash-like pattern  Neurological:     Mental Status: He is alert and oriented to person, place, and time.  Psychiatric:        Attention and Perception: Attention and perception normal.        Mood and Affect: Mood and affect normal.        Behavior: Behavior normal.        Thought Content: Thought content normal.            Assessment & Plan:  Preventive care-encouraged patient to continue healthy diet, exercise, and weight loss.  He is up-to-date on his flu shot and tetanus shots.  He is due for Pneumovax 23 booster which is given today.  Burn-a prescription was provided for Silvadene ointment to apply to the second-degree burn area once daily and bandage until healed.  Anxiety-he has been needing clonazepam more recently.  We discussed looking into doing some additional projects and things around the house to keep himself busy.  Continue Cymbalta and as needed clonazepam.  We will update controlled substance contract and obtain a urine drug screen today.  Diabetes type 2-patient is due for follow-up A1c  today.  Will obtain.  Urinary frequency-urine was noted to be quite turbid today.  A urine culture will be obtained to rule out infection.  This visit occurred during the SARS-CoV-2 public health emergency.  Safety protocols were in place, including screening questions prior to the visit, additional usage of staff PPE, and extensive cleaning of exam room while observing appropriate contact time as indicated for disinfecting solutions.

## 2019-08-23 LAB — URINE CULTURE
MICRO NUMBER:: 1155390
Result:: NO GROWTH
SPECIMEN QUALITY:: ADEQUATE

## 2019-08-25 LAB — PAIN MGMT, PROFILE 8 W/CONF, U
6 Acetylmorphine: NEGATIVE ng/mL
Alcohol Metabolites: NEGATIVE ng/mL (ref <500)
Alphahydroxyalprazolam: NEGATIVE ng/mL
Alphahydroxymidazolam: NEGATIVE ng/mL
Alphahydroxytriazolam: NEGATIVE ng/mL
Aminoclonazepam: 236 ng/mL
Amphetamines: NEGATIVE ng/mL
Benzodiazepines: POSITIVE ng/mL
Buprenorphine, Urine: NEGATIVE ng/mL
Buprenorphine: NEGATIVE ng/mL
Cocaine Metabolite: NEGATIVE ng/mL
Codeine: NEGATIVE ng/mL
Creatinine: 234.2 mg/dL
Hydrocodone: NEGATIVE ng/mL
Hydromorphone: NEGATIVE ng/mL
Hydroxyethylflurazepam: NEGATIVE ng/mL
Lorazepam: NEGATIVE ng/mL
MDMA: NEGATIVE ng/mL
Marijuana Metabolite: NEGATIVE ng/mL
Morphine: NEGATIVE ng/mL
Nordiazepam: NEGATIVE ng/mL
Norhydrocodone: NEGATIVE ng/mL
Opiates: NEGATIVE ng/mL
Oxazepam: NEGATIVE ng/mL
Oxidant: NEGATIVE ug/mL
Oxycodone: NEGATIVE ng/mL
Temazepam: NEGATIVE ng/mL
pH: 5.4 (ref 4.5–9.0)

## 2019-09-07 ENCOUNTER — Other Ambulatory Visit: Payer: Self-pay | Admitting: Family

## 2019-09-08 ENCOUNTER — Other Ambulatory Visit: Payer: Self-pay | Admitting: Family

## 2019-10-03 ENCOUNTER — Encounter: Payer: Self-pay | Admitting: Family

## 2019-10-04 ENCOUNTER — Other Ambulatory Visit: Payer: Self-pay

## 2019-10-04 MED ORDER — NITROGLYCERIN 0.4 MG SL SUBL: 0.4000 mg | SUBLINGUAL_TABLET | SUBLINGUAL | 1 refills | Status: DC | PRN

## 2019-10-10 ENCOUNTER — Encounter: Payer: Self-pay | Admitting: Family

## 2019-10-10 ENCOUNTER — Telehealth: Payer: Medicare Other | Admitting: Physician Assistant

## 2019-10-10 DIAGNOSIS — J069 Acute upper respiratory infection, unspecified: Secondary | ICD-10-CM

## 2019-10-10 MED ORDER — FLUTICASONE PROPIONATE 50 MCG/ACT NA SUSP: 2.0000 | Freq: Every day | NASAL | 0 refills | Status: DC

## 2019-10-10 NOTE — Progress Notes (Signed)
We are sorry you are not feeling well.  Here is how we plan to help!  Based on what you have shared with me, it looks like you may have a viral upper respiratory infection.  Upper respiratory infections are caused by a large number of viruses; however, rhinovirus is the most common cause.   If at any point you begin to get worse it would be important that you be seen in a face-to-face visit at an urgent care for COVID-19 testing and further evaluation.  Symptoms vary from person to person, with common symptoms including sore throat, cough, fatigue or lack of energy and feeling of general discomfort.  A low-grade fever of up to 100.4 may present, but is often uncommon.  Symptoms vary however, and are closely related to a person's age or underlying illnesses.  The most common symptoms associated with an upper respiratory infection are nasal discharge or congestion, cough, sneezing, headache and pressure in the ears and face.  These symptoms usually persist for about 3 to 10 days, but can last up to 2 weeks.  It is important to know that upper respiratory infections do not cause serious illness or complications in most cases.    Upper respiratory infections can be transmitted from person to person, with the most common method of transmission being a person's hands.  The virus is able to live on the skin and can infect other persons for up to 2 hours after direct contact.  Also, these can be transmitted when someone coughs or sneezes; thus, it is important to cover the mouth to reduce this risk.  To keep the spread of the illness at [place], good hand hygiene is very important.  This is an infection that is most likely caused by a virus. There are no specific treatments other than to help you with the symptoms until the infection runs its course.  We are sorry you are not feeling well.  Here is how we plan to help!   For nasal congestion, you may use an oral decongestants such as Mucinex D or if you have  glaucoma or high blood pressure use plain Mucinex.  Saline nasal spray or nasal drops can help and can safely be used as often as needed for congestion.  For your congestion, I have prescribed Fluticasone nasal spray one spray in each nostril twice a day  If you do not have a history of heart disease, hypertension, diabetes or thyroid disease, prostate/bladder issues or glaucoma, you may also use Sudafed to treat nasal congestion.  It is highly recommended that you consult with a pharmacist or your primary care physician to ensure this medication is safe for you to take.     If you have a cough, you may use cough suppressants such as Delsym and Robitussin.  If you have glaucoma or high blood pressure, you can also use Coricidin HBP.    If you have a sore or scratchy throat, use a saltwater gargle-  to  teaspoon of salt dissolved in a 4-ounce to 8-ounce glass of warm water.  Gargle the solution for approximately 15-30 seconds and then spit.  It is important not to swallow the solution.  You can also use throat lozenges/cough drops and Chloraseptic spray to help with throat pain or discomfort.  Warm or cold liquids can also be helpful in relieving throat pain.  For headache, pain or general discomfort, you can use Ibuprofen or Tylenol as directed.   Some authorities believe that zinc sprays  or the use of Echinacea may shorten the course of your symptoms.   HOME CARE . Only take medications as instructed by your medical team. . Be sure to drink plenty of fluids. Water is fine as well as fruit juices, sodas and electrolyte beverages. You may want to stay away from caffeine or alcohol. If you are nauseated, try taking small sips of liquids. How do you know if you are getting enough fluid? Your urine should be a pale yellow or almost colorless. . Get rest. . Taking a steamy shower or using a humidifier may help nasal congestion and ease sore throat pain. You can place a towel over your head and breathe  in the steam from hot water coming from a faucet. . Using a saline nasal spray works much the same way. . Cough drops, hard candies and sore throat lozenges may ease your cough. . Avoid close contacts especially the very young and the elderly . Cover your mouth if you cough or sneeze . Always remember to wash your hands.   GET HELP RIGHT AWAY IF: . You develop worsening fever. . If your symptoms do not improve within 10 days . You develop yellow or green discharge from your nose over 3 days. . You have coughing fits . You develop a severe head ache or visual changes. . You develop shortness of breath, difficulty breathing or start having chest pain . Your symptoms persist after you have completed your treatment plan  MAKE SURE YOU   Understand these instructions.  Will watch your condition.  Will get help right away if you are not doing well or get worse.  Your e-visit answers were reviewed by a board certified advanced clinical practitioner to complete your personal care plan. Depending upon the condition, your plan could have included both over the counter or prescription medications. Please review your pharmacy choice. If there is a problem, you may call our nursing hot line at and have the prescription routed to another pharmacy. Your safety is important to [place]. If you have drug allergies check your prescription carefully.   You can use MyChart to ask questions about today's visit, request a non-urgent call back, or ask for a work or school excuse for 24 hours related to this e-Visit. If it has been greater than 24 hours you will need to follow up with your provider, or enter a new e-Visit to address those concerns. You will get an e-mail in the next two days asking about your experience.  I hope that your e-visit has been valuable and will speed your recovery. Thank you for using e-visits.     Greater than 5 minutes, yet less than 10 minutes of time have been spent researching,  coordinating, and implementing care for this patient today

## 2019-11-04 ENCOUNTER — Encounter: Payer: Self-pay | Admitting: Family

## 2019-11-04 ENCOUNTER — Ambulatory Visit (INDEPENDENT_AMBULATORY_CARE_PROVIDER_SITE_OTHER): Payer: Medicare Other | Admitting: Family

## 2019-11-04 ENCOUNTER — Other Ambulatory Visit: Payer: Self-pay

## 2019-11-04 VITALS — Temp 98.0°F

## 2019-11-04 DIAGNOSIS — J069 Acute upper respiratory infection, unspecified: Secondary | ICD-10-CM

## 2019-11-04 MED ORDER — AZITHROMYCIN 250 MG PO TABS: ORAL_TABLET | ORAL | 0 refills | Status: DC

## 2019-11-04 MED ORDER — PREDNISONE 10 MG PO TABS: ORAL_TABLET | ORAL | 0 refills | Status: DC

## 2019-11-04 NOTE — Patient Instructions (Signed)
Below are 3 ways to schedule a covid test at [facility]:   1.  text "covid" to [id number]    or   2.  Schedule at [email or web address]   or    3.  Call [phone]

## 2019-11-04 NOTE — Progress Notes (Signed)
Virtual Visit via Video Note  I connected with Cody Raymond on 11/04/19 at 10:20 AM EST by a video enabled telemedicine application and verified that I am speaking with the correct person using two identifiers.  Location: Patient: home Provider: home   I discussed the limitations of evaluation and management by telemedicine and the availability of in person appointments. The patient expressed understanding and agreed to proceed.  History of Present Illness:  Patient is a 49 yr old male who presents today with c/o sore throat, nasal drainage/congestion, and tightness of his breathing. Reports that he was with his grandchildren who were sick. His symptoms began on 10/29/19.  He reports that he has brief improvement of his symptoms with the use of albuterol but "needs something stronger."  He was tested for covid-19 three weeks ago and was negative.  Past Medical History:  Diagnosis Date  . Aneurysm ([place])   . Asthma   . Blood transfusion   . CAD (coronary artery disease), non obstructive on cath 2011 04/05/2012  . Coronary artery disease   . Diabetes mellitus   . Diabetes mellitus without complication ([place])   . Enlarged prostate   . GERD (gastroesophageal reflux disease)   . H/O syncope   . Heart disease   . History of repair of patent ductus arteriosus 07/23/2016  . Hyperlipidemia   . Hypertension   . 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 Refusal of blood transfusions as patient is Jehovah's Witness      Social History   Socioeconomic History  . Marital status: Married    Spouse name: Not on file  . Number of children: Not on file  . Years of education: Not on file  . Highest education level: Not on file  Occupational History  . Not on file  Tobacco Use  . Smoking status: Never Smoker  . Smokeless tobacco: Never Used  Substance and Sexual Activity  . Alcohol use: No  . Drug use: No  . Sexual activity: Yes  Other Topics Concern  . Not on file  Social History  Narrative   ** Merged History Encounter **       Holter monitor 08/2010: PVCs and sinus tachy.   Sleep Study (02/2008): mild sleep apnea, no indication for CPAP.   Social Determinants of Health   Financial Resource Strain:   . Difficulty of Paying Living Expenses: Not on file  Food Insecurity:   . Worried About Charity fundraiser in the Last Year: Not on file  . Ran Out of Food in the Last Year: Not on file  Transportation Needs:   . Lack of Transportation (Medical): Not on file  . Lack of Transportation (Non-Medical): Not on file  Physical Activity:   . Days of Exercise per Week: Not on file  . Minutes of Exercise per Session: Not on file  Stress:   . Feeling of Stress : Not on file  Social Connections:   . Frequency of Communication with Friends and Family: Not on file  . Frequency of Social Gatherings with Friends and Family: Not on file  . Attends Religious Services: Not on file  . Active Member of Clubs or Organizations: Not on file  . Attends Archivist Meetings: Not on file  . Marital Status: Not on file  Intimate Partner Violence:   . Fear of Current or Ex-Partner: Not on file  . Emotionally Abused: Not on file  . Physically Abused: Not on file  .  Sexually Abused: Not on file    Past Surgical History:  Procedure Laterality Date  . APPENDECTOMY    . BACK SURGERY    . CARDIAC CATHETERIZATION  2007   Clean Cardiac Cath ([place]), with RCA 30% narrowing likely due to catheter induced spasm in 09/02/10.  Marland Kitchen CARDIAC CATHETERIZATION  09/02/2010   mod. nonobstructive disease in the RCA and CX, tortuous LAD  . COLONOSCOPY W/ POLYPECTOMY  03/17/11   diminutive polyp  . LOOP RECORDER EXPLANT N/A 01/14/2014   Procedure: LOOP RECORDER EXPLANT;  Surgeon: Sanda Klein, MD;  Location: [facility];  Service: Cardiovascular;  Laterality: N/A;  . LOOP RECORDER IMPLANT  04/06/2012   Reveal XT [id number]  . LOOP RECORDER IMPLANT N/A 04/06/2012   Procedure: LOOP  RECORDER IMPLANT;  Surgeon: Sanda Klein, MD;  Location: [facility];  Service: Cardiovascular;  Laterality: N/A;  . NM MYOCAR PERF WALL MOTION  01/25/2008   mild anteroapical wall ischemia  . PATENT DUCTUS ARTERIOUS REPAIR     at age 72    Family History  Problem Relation Age of Onset  . Colon cancer Paternal Grandfather   . Prostate cancer Paternal Grandfather   . Aneurysm Father   . Heart attack Father 45  . Coronary artery disease Father   . Colon polyps Mother   . Heart disease Mother   . Coronary artery disease Mother   . Migraines Mother   . Breast cancer Maternal Grandmother   . Colon cancer Paternal Uncle        x 2  . Stomach cancer Brother   . Liver disease Other        unsure who it was  . Allergies Daughter     Allergies  Allergen Reactions  . Benadryl [Diphenhydramine Hcl]     "drives me nuts"    Current Outpatient Medications on File Prior to Visit  Medication Sig Dispense Refill  . albuterol (PROVENTIL) (2.5 MG/3ML) 0.083% nebulizer solution USE 1 VIAL VIA NEBULIZER  EVERY 6 HOURS AS NEEDED FOR WHEEZING OR SHORTNESS OF  BREATH (Patient taking differently: Take 2.5 mg by nebulization every 6 (six) hours as needed for wheezing. ) 1125 mL 0  . Ascorbic Acid (VITAMIN C) 1000 MG tablet Take 1,000 mg by mouth daily.    Marland Kitchen aspirin EC 81 MG EC tablet Take 1 tablet (81 mg total) by mouth daily. 30 tablet 0  . atorvastatin (LIPITOR) 80 MG tablet TAKE 1 TABLET BY MOUTH ONCE DAILY (Patient taking differently: Take 80 mg by mouth daily at 6 PM. ) 90 tablet 3  . betamethasone dipropionate (DIPROLENE) 0.05 % cream Apply topically 2 (two) times daily. 30 g 0  . cetirizine (ZYRTEC) 10 MG tablet Take 10 mg by mouth daily.    . clonazePAM (KLONOPIN) 1 MG tablet Take 1 tablet (1 mg total) by mouth daily. 90 tablet 0  . diclofenac sodium (VOLTAREN) 1 % GEL Apply 2 g topically 4 (four) times daily as needed (pain). 720 g 0  . DULoxetine (CYMBALTA) 30 MG capsule TAKE 1 CAPSULE BY  MOUTH  DAILY 90 capsule 1  . ezetimibe (ZETIA) 10 MG tablet TAKE 1 TABLET BY MOUTH  DAILY 90 tablet 3  . fluticasone (FLONASE) 50 MCG/ACT nasal spray Place 2 sprays into both nostrils daily. 9.9 g 0  . gabapentin (NEURONTIN) 300 MG capsule TAKE 1 CAPSULE BY MOUTH AT  6AM, 11:30AM AND 4:30PM AND TAKE 3 CAPSULES AT 10:00PM (Patient taking differently: Take 300-900 mg  by mouth See admin instructions. Take 1 capsule at 6 am, 11:30 am and 4:30 pm then take 3 capsules at 10 pm) 540 capsule 3  . Icosapent Ethyl (VASCEPA) 1 g CAPS Take 2 capsules (2 g total) by mouth 2 (two) times daily. 120 capsule 0  . ketoconazole (NIZORAL) 2 % shampoo APPLY TOPICALLY DAILY AS  DIRECTED 360 mL 1  . lisinopril (ZESTRIL) 10 MG tablet TAKE 1 TABLET BY MOUTH  DAILY 90 tablet 3  . meclizine (ANTIVERT) 25 MG tablet Take 1 tablet (25 mg total) by mouth 3 (three) times daily as needed for dizziness. 30 tablet 0  . meloxicam (MOBIC) 7.5 MG tablet TAKE 1 TABLET BY MOUTH  DAILY AS NEEDED FOR PAIN (Patient taking differently: Take 7.5 mg by mouth daily as needed for pain. ) 90 tablet 1  . metFORMIN (GLUCOPHAGE-XR) 500 MG 24 hr tablet TAKE 2 TABLETS BY MOUTH  DAILY WITH BREAKFAST (Patient taking differently: Take 500 mg by mouth 2 (two) times daily. ) 180 tablet 3  . metoprolol tartrate (LOPRESSOR) 50 MG tablet Take 1 tablet (50 mg total) by mouth 2 (two) times daily. 180 tablet 3  . montelukast (SINGULAIR) 10 MG tablet TAKE 1 TABLET BY MOUTH AT  BEDTIME (Patient taking differently: Take 10 mg by mouth at bedtime. ) 90 tablet 3  . Multiple Vitamin (MULTIVITAMIN WITH MINERALS) TABS tablet Take 1 tablet by mouth daily.    . nitroGLYCERIN (NITROSTAT) 0.4 MG SL tablet Place 1 tablet (0.4 mg total) under the tongue every 5 (five) minutes as needed for chest pain. 30 tablet 1  . ONETOUCH VERIO test strip USE WITH METER TO CHECK  BLOOD SUGAR 3 TIMES DAILY 300 strip 3  . silver sulfADIAZINE (SILVADENE) 1 % cream Apply 1 application topically  daily. 50 g 0  . tamsulosin (FLOMAX) 0.4 MG CAPS capsule TAKE 1 CAPSULE BY MOUTH  DAILY 90 capsule 3  . tiZANidine (ZANAFLEX) 2 MG tablet TAKE 1 TABLET BY MOUTH  EVERY 6 HOURS AS NEEDED FOR MUSCLE SPASM(S) (Patient taking differently: Take 2 mg by mouth every 6 (six) hours as needed for muscle spasms. ) 270 tablet 0  . traMADol (ULTRAM-ER) 100 MG 24 hr tablet Take 1 tablet (100 mg total) by mouth daily. 30 tablet 5  . traZODone (DESYREL) 100 MG tablet TAKE 1 TABLET BY MOUTH AT  BEDTIME (Patient taking differently: Take 100 mg by mouth at bedtime. ) 90 tablet 1  . zolpidem (AMBIEN) 10 MG tablet TAKE 1 TABLET BY MOUTH AT  BEDTIME AS NEEDED FOR SLEEP (Patient taking differently: Take 10 mg by mouth at bedtime as needed for sleep. ) 90 tablet 0  . [DISCONTINUED] niacin (NIASPAN) 1000 MG CR tablet Take 1 tablet (1,000 mg total) by mouth at bedtime. 30 tablet 6   No current facility-administered medications on file prior to visit.    Temp 98 F (36.7 C) (Oral)       Observations/Objective:   Gen: Awake, alert, no acute distress Resp: Breathing is even and non-labored Psych: calm/pleasant demeanor Neuro: Alert and Oriented x 3, + facial symmetry, speech is clear.   Assessment and Plan:  Viral URI- given his hx of asthma and duration of symptoms will rx with empiric azithromycin and a prednisone taper.     Follow Up Instructions:    I discussed the assessment and treatment plan with the patient. The patient was provided an opportunity to ask questions and all were answered. The patient agreed with  the plan and demonstrated an understanding of the instructions.   The patient was advised to call back or seek an in-person evaluation if the symptoms worsen or if the condition fails to improve as anticipated.  Nance Pear, NP

## 2019-11-05 DIAGNOSIS — Z9189 Other specified personal risk factors, not elsewhere classified: Secondary | ICD-10-CM | POA: Diagnosis not present

## 2019-11-05 DIAGNOSIS — Z7689 Persons encountering health services in other specified circumstances: Secondary | ICD-10-CM | POA: Diagnosis not present

## 2019-11-05 DIAGNOSIS — R6889 Other general symptoms and signs: Secondary | ICD-10-CM | POA: Diagnosis not present

## 2019-11-09 ENCOUNTER — Other Ambulatory Visit: Payer: Self-pay | Admitting: Internal Medicine

## 2019-11-11 ENCOUNTER — Encounter: Payer: Self-pay | Admitting: Family

## 2019-11-12 ENCOUNTER — Ambulatory Visit (HOSPITAL_BASED_OUTPATIENT_CLINIC_OR_DEPARTMENT_OTHER)
Admission: RE | Admit: 2019-11-12 | Discharge: 2019-11-12 | Disposition: A | Discharge status: Home / Self Care | Payer: Medicare Other | Source: Ambulatory Visit | Attending: Family | Admitting: Family

## 2019-11-12 ENCOUNTER — Ambulatory Visit (INDEPENDENT_AMBULATORY_CARE_PROVIDER_SITE_OTHER): Payer: Medicare Other | Admitting: Family

## 2019-11-12 ENCOUNTER — Other Ambulatory Visit: Payer: Self-pay

## 2019-11-12 VITALS — BP 109/72 | HR 73 | Temp 96.6°F | Resp 16 | Ht 68.0 in | Wt 189.0 lb

## 2019-11-12 DIAGNOSIS — J4541 Moderate persistent asthma with (acute) exacerbation: Secondary | ICD-10-CM | POA: Diagnosis not present

## 2019-11-12 DIAGNOSIS — R059 Cough, unspecified: Secondary | ICD-10-CM

## 2019-11-12 DIAGNOSIS — R05 Cough: Secondary | ICD-10-CM

## 2019-11-12 DIAGNOSIS — J019 Acute sinusitis, unspecified: Secondary | ICD-10-CM

## 2019-11-12 DIAGNOSIS — R079 Chest pain, unspecified: Secondary | ICD-10-CM | POA: Diagnosis not present

## 2019-11-12 MED ORDER — FLUTICASONE-SALMETEROL 250-50 MCG/DOSE IN AEPB: 1.0000 | INHALATION_SPRAY | Freq: Two times a day (BID) | RESPIRATORY_TRACT | 3 refills | Status: DC

## 2019-11-12 MED ORDER — PREDNISONE 10 MG PO TABS: ORAL_TABLET | ORAL | 0 refills | Status: DC

## 2019-11-12 MED ORDER — AMOXICILLIN-POT CLAVULANATE 875-125 MG PO TABS: 1.0000 | ORAL_TABLET | Freq: Two times a day (BID) | ORAL | 0 refills | Status: DC

## 2019-11-12 NOTE — Progress Notes (Signed)
Subjective:    Patient ID: Cody Raymond, male    DOB: [birth year], 49 y.o.   MRN: [id number]  HPI  Patient is a 49 yr old male who presents today with chief complaint of cough and congestion.  He was seen initially on 10/10/19 for an e-visit with viral URI. He was seen virtually by myself on 11/04/19. We treated him empirically with azithromycin and prednisone taper. Reports that he had "a little improvement" following this treatment.  He reports that he is continuing mucinex.  Has had 2 negative covid tests and a negative flu shot.    Today he reports + facial pain and ongoing productive cough. He was tested for covid and this was negative.   Review of Systems See HPI  Past Medical History:  Diagnosis Date  . Aneurysm ([place])   . Asthma   . Blood transfusion   . CAD (coronary artery disease), non obstructive on cath 2011 04/05/2012  . Coronary artery disease   . Diabetes mellitus   . Diabetes mellitus without complication ([place])   . Enlarged prostate   . GERD (gastroesophageal reflux disease)   . H/O syncope   . Heart disease   . History of repair of patent ductus arteriosus 07/23/2016  . Hyperlipidemia   . Hypertension   . 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 Refusal of blood transfusions as patient is Jehovah's Witness      Social History   Socioeconomic History  . Marital status: Married    Spouse name: Not on file  . Number of children: Not on file  . Years of education: Not on file  . Highest education level: Not on file  Occupational History  . Not on file  Tobacco Use  . Smoking status: Never Smoker  . Smokeless tobacco: Never Used  Substance and Sexual Activity  . Alcohol use: No  . Drug use: No  . Sexual activity: Yes  Other Topics Concern  . Not on file  Social History Narrative   ** Merged History Encounter **       Holter monitor 08/2010: PVCs and sinus tachy.   Sleep Study (02/2008): mild sleep apnea, no indication for CPAP.   Social  Determinants of Health   Financial Resource Strain:   . Difficulty of Paying Living Expenses: Not on file  Food Insecurity:   . Worried About Charity fundraiser in the Last Year: Not on file  . Ran Out of Food in the Last Year: Not on file  Transportation Needs:   . Lack of Transportation (Medical): Not on file  . Lack of Transportation (Non-Medical): Not on file  Physical Activity:   . Days of Exercise per Week: Not on file  . Minutes of Exercise per Session: Not on file  Stress:   . Feeling of Stress : Not on file  Social Connections:   . Frequency of Communication with Friends and Family: Not on file  . Frequency of Social Gatherings with Friends and Family: Not on file  . Attends Religious Services: Not on file  . Active Member of Clubs or Organizations: Not on file  . Attends Archivist Meetings: Not on file  . Marital Status: Not on file  Intimate Partner Violence:   . Fear of Current or Ex-Partner: Not on file  . Emotionally Abused: Not on file  . Physically Abused: Not on file  . Sexually Abused: Not on file    Past Surgical History:  Procedure  Laterality Date  . APPENDECTOMY    . BACK SURGERY    . CARDIAC CATHETERIZATION  2007   Clean Cardiac Cath ([place]), with RCA 30% narrowing likely due to catheter induced spasm in 09/02/10.  Marland Kitchen CARDIAC CATHETERIZATION  09/02/2010   mod. nonobstructive disease in the RCA and CX, tortuous LAD  . COLONOSCOPY W/ POLYPECTOMY  03/17/11   diminutive polyp  . LOOP RECORDER EXPLANT N/A 01/14/2014   Procedure: LOOP RECORDER EXPLANT;  Surgeon: Sanda Klein, MD;  Location: [facility];  Service: Cardiovascular;  Laterality: N/A;  . LOOP RECORDER IMPLANT  04/06/2012   Reveal XT [id number]  . LOOP RECORDER IMPLANT N/A 04/06/2012   Procedure: LOOP RECORDER IMPLANT;  Surgeon: Sanda Klein, MD;  Location: [facility];  Service: Cardiovascular;  Laterality: N/A;  . NM MYOCAR PERF WALL MOTION  01/25/2008   mild anteroapical wall  ischemia  . PATENT DUCTUS ARTERIOUS REPAIR     at age 31    Family History  Problem Relation Age of Onset  . Colon cancer Paternal Grandfather   . Prostate cancer Paternal Grandfather   . Aneurysm Father   . Heart attack Father 32  . Coronary artery disease Father   . Colon polyps Mother   . Heart disease Mother   . Coronary artery disease Mother   . Migraines Mother   . Breast cancer Maternal Grandmother   . Colon cancer Paternal Uncle        x 2  . Stomach cancer Brother   . Liver disease Other        unsure who it was  . Allergies Daughter     Allergies  Allergen Reactions  . Benadryl [Diphenhydramine Hcl]     "drives me nuts"    Current Outpatient Medications on File Prior to Visit  Medication Sig Dispense Refill  . albuterol (PROVENTIL) (2.5 MG/3ML) 0.083% nebulizer solution USE 1 VIAL VIA NEBULIZER  EVERY 6 HOURS AS NEEDED FOR WHEEZING OR SHORTNESS OF  BREATH (Patient taking differently: Take 2.5 mg by nebulization every 6 (six) hours as needed for wheezing. ) 1125 mL 0  . Ascorbic Acid (VITAMIN C) 1000 MG tablet Take 1,000 mg by mouth daily.    Marland Kitchen aspirin EC 81 MG EC tablet Take 1 tablet (81 mg total) by mouth daily. 30 tablet 0  . atorvastatin (LIPITOR) 80 MG tablet TAKE 1 TABLET BY MOUTH ONCE DAILY (Patient taking differently: Take 80 mg by mouth daily at 6 PM. ) 90 tablet 3  . azithromycin (ZITHROMAX) 250 MG tablet Take 2 tabs by mouth now, then one tab once daily for 4 more days. 6 tablet 0  . betamethasone dipropionate (DIPROLENE) 0.05 % cream Apply topically 2 (two) times daily. 30 g 0  . cetirizine (ZYRTEC) 10 MG tablet Take 10 mg by mouth daily.    . clonazePAM (KLONOPIN) 1 MG tablet Take 1 tablet (1 mg total) by mouth daily. 90 tablet 0  . diclofenac sodium (VOLTAREN) 1 % GEL Apply 2 g topically 4 (four) times daily as needed (pain). 720 g 0  . DULoxetine (CYMBALTA) 30 MG capsule TAKE 1 CAPSULE BY MOUTH  DAILY 90 capsule 1  . ezetimibe (ZETIA) 10 MG tablet  TAKE 1 TABLET BY MOUTH  DAILY 90 tablet 3  . fluticasone (FLONASE) 50 MCG/ACT nasal spray Place 2 sprays into both nostrils daily. 9.9 g 0  . gabapentin (NEURONTIN) 300 MG capsule TAKE 1 CAPSULE BY MOUTH AT  6AM, 11:30AM AND 4:30PM  AND TAKE 3 CAPSULES AT 10:00PM (Patient taking differently: Take 300-900 mg by mouth See admin instructions. Take 1 capsule at 6 am, 11:30 am and 4:30 pm then take 3 capsules at 10 pm) 540 capsule 3  . Icosapent Ethyl (VASCEPA) 1 g CAPS Take 2 capsules (2 g total) by mouth 2 (two) times daily. 120 capsule 0  . ketoconazole (NIZORAL) 2 % shampoo APPLY TOPICALLY DAILY AS  DIRECTED 360 mL 1  . lisinopril (ZESTRIL) 10 MG tablet TAKE 1 TABLET BY MOUTH  DAILY 90 tablet 3  . meclizine (ANTIVERT) 25 MG tablet Take 1 tablet (25 mg total) by mouth 3 (three) times daily as needed for dizziness. 30 tablet 0  . meloxicam (MOBIC) 7.5 MG tablet TAKE 1 TABLET BY MOUTH  DAILY AS NEEDED FOR PAIN (Patient taking differently: Take 7.5 mg by mouth daily as needed for pain. ) 90 tablet 1  . metFORMIN (GLUCOPHAGE-XR) 500 MG 24 hr tablet TAKE 2 TABLETS BY MOUTH  DAILY WITH BREAKFAST (Patient taking differently: Take 500 mg by mouth 2 (two) times daily. ) 180 tablet 3  . metoprolol tartrate (LOPRESSOR) 50 MG tablet TAKE 1 TABLET BY MOUTH  TWICE DAILY 180 tablet 2  . montelukast (SINGULAIR) 10 MG tablet TAKE 1 TABLET BY MOUTH AT  BEDTIME (Patient taking differently: Take 10 mg by mouth at bedtime. ) 90 tablet 3  . Multiple Vitamin (MULTIVITAMIN WITH MINERALS) TABS tablet Take 1 tablet by mouth daily.    . nitroGLYCERIN (NITROSTAT) 0.4 MG SL tablet Place 1 tablet (0.4 mg total) under the tongue every 5 (five) minutes as needed for chest pain. 30 tablet 1  . ONETOUCH VERIO test strip USE WITH METER TO CHECK  BLOOD SUGAR 3 TIMES DAILY 300 strip 3  . predniSONE (DELTASONE) 10 MG tablet 4 tabs by mouth once daily for 2 days, then 3 tabs daily x 2 days, then 2 tabs daily x 2 days, then 1 tab daily x 2 days  20 tablet 0  . silver sulfADIAZINE (SILVADENE) 1 % cream Apply 1 application topically daily. 50 g 0  . tamsulosin (FLOMAX) 0.4 MG CAPS capsule TAKE 1 CAPSULE BY MOUTH  DAILY 90 capsule 3  . tiZANidine (ZANAFLEX) 2 MG tablet TAKE 1 TABLET BY MOUTH  EVERY 6 HOURS AS NEEDED FOR MUSCLE SPASM(S) (Patient taking differently: Take 2 mg by mouth every 6 (six) hours as needed for muscle spasms. ) 270 tablet 0  . traMADol (ULTRAM-ER) 100 MG 24 hr tablet Take 1 tablet (100 mg total) by mouth daily. 30 tablet 5  . traZODone (DESYREL) 100 MG tablet TAKE 1 TABLET BY MOUTH AT  BEDTIME (Patient taking differently: Take 100 mg by mouth at bedtime. ) 90 tablet 1  . zolpidem (AMBIEN) 10 MG tablet TAKE 1 TABLET BY MOUTH AT  BEDTIME AS NEEDED FOR SLEEP (Patient taking differently: Take 10 mg by mouth at bedtime as needed for sleep. ) 90 tablet 0  . [DISCONTINUED] niacin (NIASPAN) 1000 MG CR tablet Take 1 tablet (1,000 mg total) by mouth at bedtime. 30 tablet 6   No current facility-administered medications on file prior to visit.    BP 109/72 (BP Location: Right Arm, Patient Position: Sitting, Cuff Size: Small)   Pulse 73   Temp (!) 96.6 F (35.9 C) (Temporal)   Resp 16   Ht 5\' 8"  (1.727 m)   Wt 189 lb (85.7 kg)   SpO2 100%   BMI 28.74 kg/m  Objective:   Physical Exam Constitutional:      General: He is not in acute distress.    Appearance: He is well-developed.  HENT:     Head: Normocephalic and atraumatic.     Nose:     Right Sinus: Maxillary sinus tenderness present.     Left Sinus: Maxillary sinus tenderness and frontal sinus tenderness present.  Cardiovascular:     Rate and Rhythm: Normal rate and regular rhythm.     Heart sounds: No murmur.  Pulmonary:     Effort: Pulmonary effort is normal. No respiratory distress.     Breath sounds: Wheezing present. No rales.  Skin:    General: Skin is warm and dry.  Neurological:     Mental Status: He is alert and oriented to person, place, and  time.  Psychiatric:        Behavior: Behavior normal.        Thought Content: Thought content normal.           Assessment & Plan:  Sinusitis- New. Will rx with augmentin.  Acute asthma exacerbation-  Will rx with a slow prednisone taper.  Continue albuterol prn. Add advair to his regimen due to recent recurrent need for oral steroids. CXR is clear- no pneumonia.   This visit occurred during the SARS-CoV-2 public health emergency.  Safety protocols were in place, including screening questions prior to the visit, additional usage of staff PPE, and extensive cleaning of exam room while observing appropriate contact time as indicated for disinfecting solutions.

## 2019-11-12 NOTE — Patient Instructions (Signed)
Please complete x-ray on the first floor. We will contact you with further recommendations.

## 2019-11-14 ENCOUNTER — Encounter: Payer: Self-pay | Admitting: Family

## 2019-11-18 ENCOUNTER — Ambulatory Visit: Payer: Medicare Other | Admitting: Family

## 2019-11-28 ENCOUNTER — Encounter: Payer: Self-pay | Admitting: Family

## 2019-11-28 DIAGNOSIS — R05 Cough: Secondary | ICD-10-CM

## 2019-11-28 DIAGNOSIS — J329 Chronic sinusitis, unspecified: Secondary | ICD-10-CM

## 2019-11-28 DIAGNOSIS — R059 Cough, unspecified: Secondary | ICD-10-CM

## 2019-11-30 ENCOUNTER — Ambulatory Visit (HOSPITAL_BASED_OUTPATIENT_CLINIC_OR_DEPARTMENT_OTHER)
Admission: RE | Admit: 2019-11-30 | Discharge: 2019-11-30 | Disposition: A | Discharge status: Home / Self Care | Payer: Medicare Other | Source: Ambulatory Visit | Attending: Family | Admitting: Family

## 2019-11-30 ENCOUNTER — Other Ambulatory Visit: Payer: Self-pay

## 2019-11-30 DIAGNOSIS — J329 Chronic sinusitis, unspecified: Secondary | ICD-10-CM

## 2019-11-30 DIAGNOSIS — R0981 Nasal congestion: Secondary | ICD-10-CM | POA: Diagnosis not present

## 2019-11-30 DIAGNOSIS — R05 Cough: Secondary | ICD-10-CM | POA: Insufficient documentation

## 2019-11-30 DIAGNOSIS — R059 Cough, unspecified: Secondary | ICD-10-CM

## 2019-12-02 ENCOUNTER — Encounter: Payer: Self-pay | Admitting: Family

## 2019-12-02 DIAGNOSIS — M2740 Unspecified cyst of jaw: Secondary | ICD-10-CM

## 2019-12-25 ENCOUNTER — Other Ambulatory Visit: Payer: Self-pay | Admitting: Family

## 2019-12-28 ENCOUNTER — Emergency Department (HOSPITAL_COMMUNITY)
Admission: EM | Admit: 2019-12-28 | Discharge: 2019-12-28 | Disposition: A | Discharge status: Home / Self Care | Payer: Medicare Other | Attending: Emergency Medicine | Admitting: Emergency Medicine

## 2019-12-28 ENCOUNTER — Other Ambulatory Visit: Payer: Self-pay

## 2019-12-28 ENCOUNTER — Emergency Department (HOSPITAL_COMMUNITY): Payer: Medicare Other

## 2019-12-28 ENCOUNTER — Encounter (HOSPITAL_COMMUNITY): Payer: Self-pay | Admitting: Emergency Medicine

## 2019-12-28 DIAGNOSIS — Z7982 Long term (current) use of aspirin: Secondary | ICD-10-CM | POA: Insufficient documentation

## 2019-12-28 DIAGNOSIS — K219 Gastro-esophageal reflux disease without esophagitis: Secondary | ICD-10-CM | POA: Diagnosis not present

## 2019-12-28 DIAGNOSIS — R110 Nausea: Secondary | ICD-10-CM | POA: Diagnosis not present

## 2019-12-28 DIAGNOSIS — Z79899 Other long term (current) drug therapy: Secondary | ICD-10-CM | POA: Diagnosis not present

## 2019-12-28 DIAGNOSIS — R197 Diarrhea, unspecified: Secondary | ICD-10-CM | POA: Diagnosis not present

## 2019-12-28 DIAGNOSIS — R079 Chest pain, unspecified: Secondary | ICD-10-CM

## 2019-12-28 DIAGNOSIS — I10 Essential (primary) hypertension: Secondary | ICD-10-CM | POA: Diagnosis not present

## 2019-12-28 DIAGNOSIS — R109 Unspecified abdominal pain: Secondary | ICD-10-CM | POA: Diagnosis not present

## 2019-12-28 DIAGNOSIS — Z7984 Long term (current) use of oral hypoglycemic drugs: Secondary | ICD-10-CM | POA: Insufficient documentation

## 2019-12-28 DIAGNOSIS — I2510 Atherosclerotic heart disease of native coronary artery without angina pectoris: Secondary | ICD-10-CM | POA: Diagnosis not present

## 2019-12-28 DIAGNOSIS — R112 Nausea with vomiting, unspecified: Secondary | ICD-10-CM | POA: Diagnosis not present

## 2019-12-28 DIAGNOSIS — E1140 Type 2 diabetes mellitus with diabetic neuropathy, unspecified: Secondary | ICD-10-CM | POA: Insufficient documentation

## 2019-12-28 LAB — CBC
HCT: 46.3 % (ref 39.0–52.0)
Hemoglobin: 15.1 g/dL (ref 13.0–17.0)
MCH: 27.1 pg (ref 26.0–34.0)
MCHC: 32.6 g/dL (ref 30.0–36.0)
MCV: 83.1 fL (ref 80.0–100.0)
Platelets: 286 10*3/uL (ref 150–400)
RBC: 5.57 MIL/uL (ref 4.22–5.81)
RDW: 16.5 % — ABNORMAL HIGH (ref 11.5–15.5)
WBC: 13.1 10*3/uL — ABNORMAL HIGH (ref 4.0–10.5)
nRBC: 0 % (ref 0.0–0.2)

## 2019-12-28 LAB — BASIC METABOLIC PANEL
Anion gap: 12 (ref 5–15)
BUN: 18 mg/dL (ref 6–20)
CO2: 23 mmol/L (ref 22–32)
Calcium: 9.2 mg/dL (ref 8.9–10.3)
Chloride: 104 mmol/L (ref 98–111)
Creatinine, Ser: 0.95 mg/dL (ref 0.61–1.24)
GFR calc Af Amer: >60 mL/min (ref >60)
GFR calc non Af Amer: >60 mL/min (ref >60)
Glucose, Bld: 128 mg/dL — ABNORMAL HIGH (ref 70–99)
Potassium: 4 mmol/L (ref 3.5–5.1)
Sodium: 139 mmol/L (ref 135–145)

## 2019-12-28 LAB — HEPATIC FUNCTION PANEL
ALT: 21 U/L (ref 0–44)
AST: 20 U/L (ref 15–41)
Albumin: 4.1 g/dL (ref 3.5–5.0)
Alkaline Phosphatase: 84 U/L (ref 38–126)
Bilirubin, Direct: <0.1 mg/dL (ref 0.0–0.2)
Total Bilirubin: 0.9 mg/dL (ref 0.3–1.2)
Total Protein: 7.4 g/dL (ref 6.5–8.1)

## 2019-12-28 LAB — LIPASE, BLOOD: Lipase: 29 U/L (ref 11–51)

## 2019-12-28 LAB — CBG MONITORING, ED: Glucose-Capillary: 125 mg/dL — ABNORMAL HIGH (ref 70–99)

## 2019-12-28 LAB — TROPONIN I (HIGH SENSITIVITY)
Troponin I (High Sensitivity): 4 ng/L (ref <18)
Troponin I (High Sensitivity): 4 ng/L (ref <18)

## 2019-12-28 LAB — POC OCCULT BLOOD, ED: Fecal Occult Bld: NEGATIVE

## 2019-12-28 MED ORDER — PANTOPRAZOLE SODIUM 20 MG PO TBEC: 20.0000 mg | DELAYED_RELEASE_TABLET | Freq: Every day | ORAL | 0 refills | Status: DC

## 2019-12-28 MED ORDER — ACETAMINOPHEN 325 MG PO TABS
650.0000 mg | ORAL_TABLET | Freq: Once | ORAL | Status: AC
  Administered 2019-12-28: 650 mg via ORAL
  Filled 2019-12-28: qty 2

## 2019-12-28 MED ORDER — PANTOPRAZOLE SODIUM 40 MG IV SOLR
40.0000 mg | Freq: Once | INTRAVENOUS | Status: AC
  Administered 2019-12-28: 40 mg via INTRAVENOUS
  Filled 2019-12-28: qty 40

## 2019-12-28 MED ORDER — SODIUM CHLORIDE 0.9% FLUSH: 3.0000 mL | Freq: Once | INTRAVENOUS | Status: DC

## 2019-12-28 MED ORDER — ONDANSETRON HCL 4 MG/2ML IJ SOLN
4.0000 mg | Freq: Once | INTRAMUSCULAR | Status: AC
  Administered 2019-12-28: 15:00:00 4 mg via INTRAVENOUS
  Filled 2019-12-28: qty 2

## 2019-12-28 MED ORDER — SODIUM CHLORIDE 0.9 % IV BOLUS
1000.0000 mL | Freq: Once | INTRAVENOUS | Status: AC
  Administered 2019-12-28: 15:00:00 1000 mL via INTRAVENOUS

## 2019-12-28 MED ORDER — ONDANSETRON HCL 4 MG PO TABS: 4.0000 mg | ORAL_TABLET | Freq: Three times a day (TID) | ORAL | 0 refills | Status: DC | PRN

## 2019-12-28 MED ORDER — IOHEXOL 350 MG/ML SOLN
100.0000 mL | Freq: Once | INTRAVENOUS | Status: AC | PRN
  Administered 2019-12-28: 100 mL via INTRAVENOUS

## 2019-12-28 MED ORDER — METOPROLOL TARTRATE 5 MG/5ML IV SOLN
5.0000 mg | Freq: Once | INTRAVENOUS | Status: AC
  Administered 2019-12-28: 18:00:00 5 mg via INTRAVENOUS
  Filled 2019-12-28: qty 5

## 2019-12-28 MED ORDER — SODIUM CHLORIDE 0.9 % IV BOLUS
1000.0000 mL | Freq: Once | INTRAVENOUS | Status: AC
  Administered 2019-12-28: 1000 mL via INTRAVENOUS

## 2019-12-28 NOTE — ED Provider Notes (Addendum)
Care of the patient was assumed to the change of shift, here for epigastric and chest pain along with occasional vomiting.  He has a history of a thoracic aneurysm, not yet requiring surgery.  His labs are reviewed and unremarkable including troponin x2.  A CT angiogram was negative for any worsening of his previously demonstrated thoracic aneurysm.  On reevaluation patient reports he is feeling better although he is still mildly tachycardic.  He reports that he vomited up his blood pressure medicines earlier today including his metoprolol which may account for some of his tachycardia.  He has had some medicines and will take them here.  He is requesting something to help with his reflux and otherwise is comfortable going home.   5:48 PM Patient now reports he still feels jittery and heart racing even after taking his home metoprolol. Remains mildly tachycardic. Will give a dose of lopressor and additional IVF and reassess.   8:07 PM HR improving. Patient feeling better now and would like to go home. Advised to return for worsening.      Truddie Hidden, MD 12/28/19 2008

## 2019-12-28 NOTE — ED Provider Notes (Signed)
[facility] EMERGENCY DEPARTMENT Provider Note   CSN: [id number] Arrival date & time: 12/28/19  1305     History Chief Complaint  Patient presents with  . Chest Pain  . Abdominal Pain    Cody Raymond is a 49 y.o. male.  The history is provided by the patient.  Chest Pain Pain location:  Substernal area and epigastric Pain quality: aching   Pain radiates to:  Does not radiate Pain severity:  Moderate Onset quality:  Gradual Timing:  Constant Progression:  Partially resolved Chronicity:  New Context: at rest   Context comment:  Patient with chest pain, abdominal pain but then developed some emesis that was may be blood-tinged, one episode of diarrhea.  History of aneurysm.  Ate suspicious food 2 days ago.  Daughter admitted to the hospital for dehydration from n/v/d.  Relieved by:  Nothing Worsened by:  Nothing Associated symptoms: abdominal pain and nausea   Associated symptoms: no back pain, no cough, no fever, no palpitations, no shortness of breath and no vomiting   Risk factors: coronary artery disease        Past Medical History:  Diagnosis Date  . Aneurysm ([place])   . Asthma   . Blood transfusion   . CAD (coronary artery disease), non obstructive on cath 2011 04/05/2012  . Coronary artery disease   . Diabetes mellitus   . Diabetes mellitus without complication ([place])   . Enlarged prostate   . GERD (gastroesophageal reflux disease)   . H/O syncope   . Heart disease   . History of repair of patent ductus arteriosus 07/23/2016  . Hyperlipidemia   . Hypertension   . 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 Refusal of blood transfusions as patient is Jehovah's Witness     Patient Active Problem List   Diagnosis Date Noted  . Family history of coronary artery disease 04/19/2019  . Tachycardia 04/19/2019  . Chest pain 12/14/2018  . Dyslipidemia 10/23/2018  . Thoracic aortic aneurysm ([place]) 10/23/2018  . Right shoulder injury, initial  encounter 06/18/2017  . Trochanteric bursitis of right hip 10/17/2016  . History of repair of patent ductus arteriosus 07/23/2016  . Cervicalgia 07/08/2016  . Fibromyalgia 07/08/2016  . Cervical spondylosis without myelopathy 07/08/2016  . Essential hypertension 02/08/2016  . Lumbar facet arthropathy 10/19/2015  . Chronic lumbar radiculopathy 06/22/2015  . History of syncope 10/20/2014  . Urinary frequency 07/08/2014  . Allergic rhinitis 07/08/2014  . Lumbar radiculopathy, chronic 07/26/2013  . Morton's neuroma of left foot 03/18/2013  . Depression with anxiety 12/21/2012  . Insomnia 07/23/2012  . CAD (coronary artery disease), non obstructive on cath 2011 04/05/2012  . Right bundle branch block 04/04/2012  . Peripheral neuropathy 04/04/2012  . Chronic back pain 03/28/2011  . Palpitations 03/01/2011  . Type 2 diabetes, controlled, with peripheral neuropathy ([place]) 09/07/2010  . GERD 09/07/2010  . OBESITY, NOS 11/16/2006  . ERECTILE DYSFUNCTION 11/16/2006  . Asthma with acute exacerbation 11/16/2006    Past Surgical History:  Procedure Laterality Date  . APPENDECTOMY    . BACK SURGERY    . CARDIAC CATHETERIZATION  2007   Clean Cardiac Cath ([place]), with RCA 30% narrowing likely due to catheter induced spasm in 09/02/10.  Marland Kitchen CARDIAC CATHETERIZATION  09/02/2010   mod. nonobstructive disease in the RCA and CX, tortuous LAD  . COLONOSCOPY W/ POLYPECTOMY  03/17/11   diminutive polyp  . LOOP RECORDER EXPLANT N/A 01/14/2014   Procedure: LOOP RECORDER EXPLANT;  Surgeon: Sanda Klein, MD;  Location: [facility] CATH LAB;  Service: Cardiovascular;  Laterality: N/A;  . LOOP RECORDER IMPLANT  04/06/2012   Reveal XT [id number]  . LOOP RECORDER IMPLANT N/A 04/06/2012   Procedure: LOOP RECORDER IMPLANT;  Surgeon: Sanda Klein, MD;  Location: [facility] CATH LAB;  Service: Cardiovascular;  Laterality: N/A;  . NM MYOCAR PERF WALL MOTION  01/25/2008   mild anteroapical wall ischemia  . PATENT DUCTUS  ARTERIOUS REPAIR     at age 69       Family History  Problem Relation Age of Onset  . Colon cancer Paternal Grandfather   . Prostate cancer Paternal Grandfather   . Aneurysm Father   . Heart attack Father 31  . Coronary artery disease Father   . Colon polyps Mother   . Heart disease Mother   . Coronary artery disease Mother   . Migraines Mother   . Breast cancer Maternal Grandmother   . Colon cancer Paternal Uncle        x 2  . Stomach cancer Brother   . Liver disease Other        unsure who it was  . Allergies Daughter     Social History   Tobacco Use  . Smoking status: Never Smoker  . Smokeless tobacco: Never Used  Substance Use Topics  . Alcohol use: No  . Drug use: No    Home Medications Prior to Admission medications   Medication Sig Start Date End Date Taking? Authorizing Provider  albuterol (PROVENTIL) (2.5 MG/3ML) 0.083% nebulizer solution USE 1 VIAL VIA NEBULIZER  EVERY 6 HOURS AS NEEDED FOR WHEEZING OR SHORTNESS OF  BREATH Patient taking differently: Take 2.5 mg by nebulization every 6 (six) hours as needed for wheezing.  05/21/19   [name], NP  amoxicillin-clavulanate (AUGMENTIN) 875-125 MG tablet Take 1 tablet by mouth 2 (two) times daily. 11/12/19   [name], NP  Ascorbic Acid (VITAMIN C) 1000 MG tablet Take 1,000 mg by mouth daily.    [provider]  aspirin EC 81 MG EC tablet Take 1 tablet (81 mg total) by mouth daily. 12/16/18   Dessa Phi, DO  atorvastatin (LIPITOR) 80 MG tablet TAKE 1 TABLET BY MOUTH ONCE DAILY Patient taking differently: Take 80 mg by mouth daily at 6 PM.  04/24/19   [name], NP  betamethasone dipropionate (DIPROLENE) 0.05 % cream Apply topically 2 (two) times daily. 05/31/19   [name], NP  cetirizine (ZYRTEC) 10 MG tablet Take 10 mg by mouth daily.    [provider]  clonazePAM (KLONOPIN) 1 MG tablet Take 1 tablet (1 mg total) by mouth daily. 04/24/19   [name], NP  diclofenac sodium (VOLTAREN) 1 % GEL Apply 2 g topically 4 (four) times daily as needed (pain). 10/04/17   [name], NP  DULoxetine (CYMBALTA) 30 MG capsule TAKE 1 CAPSULE BY MOUTH  DAILY 12/26/19   [name], NP  ezetimibe (ZETIA) 10 MG tablet TAKE 1 TABLET BY MOUTH  DAILY 09/09/19   [name], NP  fluticasone (FLONASE) 50 MCG/ACT nasal spray Place 2 sprays into both nostrils daily. 10/10/19   Muthersbaugh, Jarrett Soho, PA-C  Fluticasone-Salmeterol (ADVAIR DISKUS) 250-50 MCG/DOSE AEPB Inhale 1 puff into the lungs 2 (two) times daily. 11/12/19   [name], NP  gabapentin (NEURONTIN) 300 MG capsule TAKE 1 CAPSULE BY MOUTH AT  6AM, 11:30AM AND 4:30PM AND TAKE 3 CAPSULES AT 10:00PM Patient taking differently: Take 300-900 mg by mouth See admin  instructions. Take 1 capsule at 6 am, 11:30 am and 4:30 pm then take 3 capsules at 10 pm 04/24/19   [name], NP  Icosapent Ethyl (VASCEPA) 1 g CAPS Take 2 capsules (2 g total) by mouth 2 (two) times daily. 12/19/18   Hilty, Nadean Corwin, MD  ketoconazole (NIZORAL) 2 % shampoo APPLY TOPICALLY DAILY AS  DIRECTED 04/24/19   [name], NP  lisinopril (ZESTRIL) 10 MG tablet TAKE 1 TABLET BY MOUTH  DAILY 07/11/19   [name], NP  meclizine (ANTIVERT) 25 MG tablet Take 1 tablet (25 mg total) by mouth 3 (three) times daily as needed for dizziness. 06/17/18   Deno Etienne, DO  meloxicam (MOBIC) 7.5 MG tablet TAKE 1 TABLET BY MOUTH  DAILY AS NEEDED FOR PAIN Patient taking differently: Take 7.5 mg by mouth daily as needed for pain.  05/21/19   [name], NP  metFORMIN (GLUCOPHAGE-XR) 500 MG 24 hr tablet TAKE 2 TABLETS BY MOUTH  DAILY WITH BREAKFAST Patient taking differently: Take 500 mg by mouth 2 (two) times daily.  04/24/19   [name], NP  metoprolol tartrate (LOPRESSOR) 50 MG tablet TAKE 1 TABLET BY MOUTH  TWICE DAILY 11/11/19   Hilty, Nadean Corwin, MD  montelukast (SINGULAIR) 10 MG tablet TAKE  1 TABLET BY MOUTH AT  BEDTIME Patient taking differently: Take 10 mg by mouth at bedtime.  04/24/19   [name], NP  Multiple Vitamin (MULTIVITAMIN WITH MINERALS) TABS tablet Take 1 tablet by mouth daily.    [provider]  nitroGLYCERIN (NITROSTAT) 0.4 MG SL tablet Place 1 tablet (0.4 mg total) under the tongue every 5 (five) minutes as needed for chest pain. 10/04/19   [name], NP  ONETOUCH VERIO test strip USE WITH METER TO CHECK  BLOOD SUGAR 3 TIMES DAILY 09/09/19   [name], NP  predniSONE (DELTASONE) 10 MG tablet 4 tabs by mouth once daily for 3 days, then 3 tabs daily x 3 days, then 2 tabs daily x 3 days, then 1 tab daily x 3 days 11/12/19   [name], NP  silver sulfADIAZINE (SILVADENE) 1 % cream Apply 1 application topically daily. 08/21/19   [name], NP  tamsulosin (FLOMAX) 0.4 MG CAPS capsule TAKE 1 CAPSULE BY MOUTH  DAILY 08/14/19   [name], NP  tiZANidine (ZANAFLEX) 2 MG tablet TAKE 1 TABLET BY MOUTH  EVERY 6 HOURS AS NEEDED FOR MUSCLE SPASM(S) Patient taking differently: Take 2 mg by mouth every 6 (six) hours as needed for muscle spasms.  05/21/19   Kirsteins, Luanna Salk, MD  traMADol (ULTRAM-ER) 100 MG 24 hr tablet Take 1 tablet (100 mg total) by mouth daily. 08/09/19   Kirsteins, Luanna Salk, MD  traZODone (DESYREL) 100 MG tablet TAKE 1 TABLET BY MOUTH AT  BEDTIME Patient taking differently: Take 100 mg by mouth at bedtime.  04/16/19   [name], NP  zolpidem (AMBIEN) 10 MG tablet TAKE 1 TABLET BY MOUTH AT  BEDTIME AS NEEDED FOR SLEEP Patient taking differently: Take 10 mg by mouth at bedtime as needed for sleep.  04/24/19   [name], NP  niacin (NIASPAN) 1000 MG CR tablet Take 1 tablet (1,000 mg total) by mouth at bedtime. 07/27/11 11/08/17  Burnice Logan, MD    Allergies    Benadryl [diphenhydramine hcl]  Review of Systems   Review of Systems  Constitutional: Negative for chills and fever.   HENT: Negative for ear pain and sore throat.   Eyes: Negative for  pain and visual disturbance.  Respiratory: Negative for cough and shortness of breath.   Cardiovascular: Positive for chest pain. Negative for palpitations.  Gastrointestinal: Positive for abdominal pain, diarrhea and nausea. Negative for abdominal distention, blood in stool, constipation and vomiting.  Genitourinary: Negative for dysuria and hematuria.  Musculoskeletal: Negative for arthralgias and back pain.  Skin: Negative for color change and rash.  Neurological: Negative for seizures and syncope.  All other systems reviewed and are negative.   Physical Exam Updated Vital Signs  ED Triage Vitals  Enc Vitals Group     BP 12/28/19 1313 136/87     Pulse Rate 12/28/19 1313 (!) 102     Resp 12/28/19 1313 14     Temp 12/28/19 1313 98.5 F (36.9 C)     Temp Source 12/28/19 1313 Oral     SpO2 12/28/19 1313 97 %     Weight 12/28/19 1314 188 lb (85.3 kg)     Height 12/28/19 1314 5\' 8"  (1.727 m)     Head Circumference --      Peak Flow --      Pain Score 12/28/19 1320 7     Pain Loc --      Pain Edu? --      Excl. in [place]? --     Physical Exam Vitals and nursing note reviewed.  Constitutional:      General: He is not in acute distress.    Appearance: He is well-developed. He is not ill-appearing.  HENT:     Head: Normocephalic and atraumatic.  Eyes:     Extraocular Movements: Extraocular movements intact.     Conjunctiva/sclera: Conjunctivae normal.     Pupils: Pupils are equal, round, and reactive to light.  Cardiovascular:     Rate and Rhythm: Normal rate and regular rhythm.     Pulses:          Radial pulses are 2+ on the right side and 2+ on the left side.       Dorsalis pedis pulses are 2+ on the right side and 2+ on the left side.     Heart sounds: Normal heart sounds. No murmur.  Pulmonary:     Effort: Pulmonary effort is normal. No respiratory distress.     Breath sounds: Normal breath sounds. No  decreased breath sounds, wheezing, rhonchi or rales.  Abdominal:     Palpations: Abdomen is soft.     Tenderness: There is abdominal tenderness (epigastric region ).  Genitourinary:    Rectum: Guaiac result negative (brown stool on exam).  Musculoskeletal:        General: Normal range of motion.     Cervical back: Normal range of motion and neck supple.     Right lower leg: No edema.     Left lower leg: No edema.  Skin:    General: Skin is warm and dry.     Capillary Refill: Capillary refill takes less than 2 seconds.  Neurological:     General: No focal deficit present.     Mental Status: He is alert and oriented to person, place, and time.     Cranial Nerves: No cranial nerve deficit.     Motor: No weakness.  Psychiatric:        Mood and Affect: Mood normal.     ED Results / Procedures / Treatments   Labs (all labs ordered are listed, but only abnormal results are displayed) Labs Reviewed  BASIC METABOLIC PANEL - Abnormal; Notable for  the following components:      Result Value   Glucose, Bld 128 (*)    All other components within normal limits  CBC - Abnormal; Notable for the following components:   WBC 13.1 (*)    RDW 16.5 (*)    All other components within normal limits  CBG MONITORING, ED - Abnormal; Notable for the following components:   Glucose-Capillary 125 (*)    All other components within normal limits  LIPASE, BLOOD  HEPATIC FUNCTION PANEL  POC OCCULT BLOOD, ED  TROPONIN I (HIGH SENSITIVITY)  TROPONIN I (HIGH SENSITIVITY)    EKG EKG Interpretation  Date/Time:  Saturday December 28 2019 13:13:44 EDT Ventricular Rate:  97 PR Interval:  136 QRS Duration: 96 QT Interval:  348 QTC Calculation: 441 R Axis:   82 Text Interpretation: Normal sinus rhythm Incomplete right bundle branch block Borderline ECG Confirmed by Lennice Sites [phone]) on 12/28/2019 1:59:58 PM   Radiology DG Chest 2 View  Result Date: 12/28/2019 CLINICAL DATA:  Chest pain. EXAM:  CHEST - 2 VIEW COMPARISON:  11/30/2019 FINDINGS: Cardiomediastinal contours and hilar structures are normal. Lungs are clear. No pleural effusion. Signs of prior trauma to the left hemithorax, unchanged from previous exam. Multiple healed rib fractures as before. IMPRESSION: No acute cardiopulmonary disease. Electronically Signed   By: Zetta Bills M.D.   On: 12/28/2019 14:18    Procedures Procedures (including critical care time)  Medications Ordered in ED Medications  sodium chloride flush (NS) 0.9 % injection 3 mL (has no administration in time range)  iohexol (OMNIPAQUE) 350 MG/ML injection 100 mL (has no administration in time range)  ondansetron (ZOFRAN) injection 4 mg (4 mg Intravenous Given 12/28/19 1438)  sodium chloride 0.9 % bolus 1,000 mL (1,000 mLs Intravenous New Bag/Given 12/28/19 1437)    ED Course  I have reviewed the triage vital signs and the nursing notes.  Pertinent labs & imaging results that were available during my care of the patient were reviewed by me and considered in my medical decision making (see chart for details).    MDM Rules/Calculators/A&P                      Haidar Pinson is a 49 year old male with history of CAD, thoracic aneurysm who presents to the ED with chest pain, abdominal pain.  Patient with unremarkable vitals.  No fever.  Patient started to have some chest pain about an hour ago prior to arrival.  Took nitroglycerin but did not help.  Then developed some nausea, vomiting and has had an episode of diarrhea.  Daughter is actually currently hospitalized for food poisoning.  She has nausea, vomiting, diarrhea.  They may be ate some suspicious food at [place] 2 days ago.  He still feels nauseous currently.  He is concerned about his aneurysm and having chest pain.  He thought he vomited blood.  Patient has good pulses throughout, clear breath sounds.  Overall appears well but mildly uncomfortable.  Rectal exam shows brown stool.  Overall low  suspicion for dissection but will get CT scan of his chest, abdomen, pelvis to evaluate for infectious process, dissection.  EKG shows sinus rhythm.  No ischemic changes.  Will get lab work including troponin.  Patient with mild leukocytosis but otherwise no significant anemia, electrolyte abnormality, kidney injury.  Troponin normal.  Chest x-ray without any signs of infection.  Overall will get CT scan to rule out dissection or other intra-abdominal or intrathoracic process.  However suspect likely viral GI process likely foodborne origin.  Low concern for ACS.  Initial troponin normal.  Please see oncoming ED providers note for further results, evaluation, disposition patient.  If all work-up is unremarkable anticipate discharge to home with antiemetics.  This chart was dictated using voice recognition software.  Despite best efforts to proofread,  errors can occur which can change the documentation meaning.     Final Clinical Impression(s) / ED Diagnoses Final diagnoses:  Chest pain, unspecified type  Nausea    Rx / DC Orders ED Discharge Orders    None       Lennice Sites, DO 12/28/19 1519

## 2019-12-28 NOTE — ED Notes (Signed)
Informed by X-Ray department that patient vomited blood in in X-Ray.

## 2019-12-28 NOTE — ED Triage Notes (Signed)
Pt. Stated. I started having chest pain with nausea about 30 min. Ago. Took a NTG and did not help.

## 2020-01-17 ENCOUNTER — Telehealth: Payer: Self-pay | Admitting: Cardiovascular Disease

## 2020-01-17 NOTE — Telephone Encounter (Signed)
LMOM to call and schedule f/u visit

## 2020-01-18 ENCOUNTER — Other Ambulatory Visit: Payer: Self-pay | Admitting: Family

## 2020-01-20 NOTE — Progress Notes (Signed)
Cardiology Office Note:    Date:  01/21/2020   ID:  Cody Raymond, DOB September 23, 1970, MRN [id number]  PCP:  Cody Alar, NP  Cardiologist:  Cody Burow, MD  Electrophysiologist:  None   Referring MD: Cody Alar, NP   Chief Complaint: follow up of chest pain  History of Present Illness:    Cody Raymond is a 49 y.o. 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 Gwenlyn Raymond and presents today for follow-up.   Patient had a cardiac catheterization in 2011 which showed moderate non-obstructive CAD of RCA and LCX and a tortuous LAD. He had a syncopal event in 2013 but implantable loop recorder showed no evidence of arrhythmias.  Loop recorder was removed in 2015 after EOS. No recurrent syncope. He has a history of abnormal EKG and there was previously concern that he may have Brugada syndrome but patient states he was later told that he did not have that.  Patient was admitted in 11/2018 with chest pain. EKG and cardiac enzymes unremarkable. Echo showed LVEF of 60-65% with no wall motion abnormalities. Patient had a coronary CTA in 02/2019 which showed calcium score of 0 and no significant CAD. Patient was seen by Cody Ransom, PA-C, in 03/2019 at which time he noted occasional resting tachycardia on his FitBit. Thyroid function was function. He was already on beta-blocker. This was not increased due to soft BP. He was last seen by Dr. Gwenlyn Raymond in 07/2019 at which time he continued to note occasional palpitations as well as rare atypical chest pain. Imdur was stopped due to soft BP.   Patient seen in the ED on 12/28/2019 for epigastric/chest pain with nausea and vomiting. EKG showed no acute ischemic changes. Cardiac enzymes negative x2. Chest CTA showed stable thoracic aorta aneurysm. WBC mildly elevated  at 13.1. Otherwise, lab work including LFTs and lipase unremarkable.   Patient presents today for follow-up. Here with wife. Patient has multiple complaints today. He continues to have chest pain that he thinks is becoming more frequent. Occurs almost on a daily basis now. He describes it has central chest pain but does note associated numbness in bilateral arms. Mostly occurs at rest but also worse with activity such as walking up the steps. He notes associated shortness of breath and nausea/vomting with this at time. Patient ran out of his Protonix a couple of days ago and has noted worsening heartburn right after eating over the last 4 days. Patient has been taking more Nitro over the last month. Sometimes this helps but not always. He states he has probably taken 10 Nitro tablets over the past month. He was previously on Imdur and states this helped his chest pain but this was stopped due to soft BP and dizziness. He also notes some pleuritic pain. He states she has a history of recurrent bronchitis. Patient reports he has been sleeping upright since December but hard to tell if this is from shortness of breath, chest pain, or GERD. He notes possible PND. No lower extremity edema. He notes continued palpitations and states last night his heart rate was in the 120's at rest. However, he does state he was having significant chest pain at this time. He also has chronic back pain which has been causing him a lot of trouble because his insurance is no longer covering Tramadol. In  addition, he notes significant fatigue.   Past Medical History:  Diagnosis Date  . Aneurysm ([place])   . Asthma   . Blood transfusion   . CAD (coronary artery disease), non obstructive on cath 2011 04/05/2012  . Coronary artery disease   . Diabetes mellitus   . Diabetes mellitus without complication ([place])   . Enlarged prostate   . GERD (gastroesophageal reflux disease)   . H/O syncope   . Heart disease   . History of repair of  patent ductus arteriosus 07/23/2016  . Hyperlipidemia   . Hypertension   . 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 Refusal of blood transfusions as patient is Jehovah's Witness     Past Surgical History:  Procedure Laterality Date  . APPENDECTOMY    . BACK SURGERY    . CARDIAC CATHETERIZATION  2007   Clean Cardiac Cath ([place]), with RCA 30% narrowing likely due to catheter induced spasm in 09/02/10.  Marland Kitchen CARDIAC CATHETERIZATION  09/02/2010   mod. nonobstructive disease in the RCA and CX, tortuous LAD  . COLONOSCOPY W/ POLYPECTOMY  03/17/11   diminutive polyp  . LOOP RECORDER EXPLANT N/A 01/14/2014   Procedure: LOOP RECORDER EXPLANT;  Surgeon: Sanda Klein, MD;  Location: [facility];  Service: Cardiovascular;  Laterality: N/A;  . LOOP RECORDER IMPLANT  04/06/2012   Reveal XT [id number]  . LOOP RECORDER IMPLANT N/A 04/06/2012   Procedure: LOOP RECORDER IMPLANT;  Surgeon: Sanda Klein, MD;  Location: [facility];  Service: Cardiovascular;  Laterality: N/A;  . NM MYOCAR PERF WALL MOTION  01/25/2008   mild anteroapical wall ischemia  . PATENT DUCTUS ARTERIOUS REPAIR     at age 89    Current Medications: Current Meds  Medication Sig  . albuterol (PROVENTIL) (2.5 MG/3ML) 0.083% nebulizer solution USE 1 VIAL VIA NEBULIZER  EVERY 6 HOURS AS NEEDED FOR WHEEZING OR SHORTNESS OF  BREATH (Patient taking differently: Take 2.5 mg by nebulization every 6 (six) hours as needed for wheezing. )  . Ascorbic Acid (VITAMIN C) 1000 MG tablet Take 1,000 mg by mouth daily.  Marland Kitchen aspirin EC 81 MG EC tablet Take 1 tablet (81 mg total) by mouth daily.  Marland Kitchen atorvastatin (LIPITOR) 80 MG tablet TAKE 1 TABLET BY MOUTH ONCE DAILY (Patient taking differently: Take 80 mg by mouth daily at 6 PM. )  . betamethasone dipropionate (DIPROLENE) 0.05 % cream Apply topically 2 (two) times daily.  . cetirizine (ZYRTEC) 10 MG tablet Take 10 mg by mouth daily.  . clonazePAM (KLONOPIN) 1 MG tablet Take 1 tablet (1 mg total) by  mouth daily.  . diclofenac sodium (VOLTAREN) 1 % GEL Apply 2 g topically 4 (four) times daily as needed (pain).  . DULoxetine (CYMBALTA) 30 MG capsule TAKE 1 CAPSULE BY MOUTH  DAILY  . ezetimibe (ZETIA) 10 MG tablet TAKE 1 TABLET BY MOUTH  DAILY  . fluticasone (FLONASE) 50 MCG/ACT nasal spray Place 2 sprays into both nostrils daily.  Marland Kitchen gabapentin (NEURONTIN) 300 MG capsule TAKE 1 CAPSULE BY MOUTH AT  6AM, 11:30AM AND 4:30PM AND TAKE 3 CAPSULES AT 10:00PM (Patient taking differently: Take 300-900 mg by mouth See admin instructions. Take 1 capsule at 6 am, 11:30 am and 4:30 pm then take 3 capsules at 10 pm)  . Icosapent Ethyl (VASCEPA) 1 g CAPS Take 2 capsules (2 g total) by mouth 2 (two) times daily.  Marland Kitchen ketoconazole (NIZORAL) 2 % shampoo APPLY TOPICALLY DAILY AS  DIRECTED  .  meclizine (ANTIVERT) 25 MG tablet Take 1 tablet (25 mg total) by mouth 3 (three) times daily as needed for dizziness.  . meloxicam (MOBIC) 7.5 MG tablet TAKE 1 TABLET BY MOUTH  DAILY AS NEEDED FOR PAIN (Patient taking differently: Take 7.5 mg by mouth daily as needed for pain. )  . metFORMIN (GLUCOPHAGE-XR) 500 MG 24 hr tablet TAKE 2 TABLETS BY MOUTH  DAILY WITH BREAKFAST (Patient taking differently: Take 500 mg by mouth 2 (two) times daily. )  . metoprolol tartrate (LOPRESSOR) 50 MG tablet TAKE 1 TABLET BY MOUTH  TWICE DAILY  . montelukast (SINGULAIR) 10 MG tablet TAKE 1 TABLET BY MOUTH AT  BEDTIME (Patient taking differently: Take 10 mg by mouth at bedtime. )  . Multiple Vitamin (MULTIVITAMIN WITH MINERALS) TABS tablet Take 1 tablet by mouth daily.  . nitroGLYCERIN (NITROSTAT) 0.4 MG SL tablet Place 1 tablet (0.4 mg total) under the tongue every 5 (five) minutes as needed for chest pain.  Marland Kitchen ondansetron (ZOFRAN) 4 MG tablet Take 1 tablet (4 mg total) by mouth every 8 (eight) hours as needed for nausea or vomiting.  Glory Rosebush VERIO test strip USE WITH METER TO CHECK  BLOOD SUGAR 3 TIMES DAILY  . pantoprazole (PROTONIX) 40 MG  tablet Take 1 tablet (40 mg total) by mouth daily.  . silver sulfADIAZINE (SILVADENE) 1 % cream Apply 1 application topically daily.  . tamsulosin (FLOMAX) 0.4 MG CAPS capsule TAKE 1 CAPSULE BY MOUTH  DAILY  . tiZANidine (ZANAFLEX) 2 MG tablet TAKE 1 TABLET BY MOUTH  EVERY 6 HOURS AS NEEDED FOR MUSCLE SPASM(S) (Patient taking differently: Take 2 mg by mouth every 6 (six) hours as needed for muscle spasms. )  . traZODone (DESYREL) 100 MG tablet TAKE 1 TABLET BY MOUTH AT  BEDTIME (Patient taking differently: Take 100 mg by mouth at bedtime. )  . zolpidem (AMBIEN) 10 MG tablet TAKE 1 TABLET BY MOUTH AT  BEDTIME AS NEEDED FOR SLEEP (Patient taking differently: Take 10 mg by mouth at bedtime as needed for sleep. )  . [DISCONTINUED] lisinopril (ZESTRIL) 10 MG tablet TAKE ONE TABLET BY MOUTH DAILY  . [DISCONTINUED] nitroGLYCERIN (NITROSTAT) 0.4 MG SL tablet Place 1 tablet (0.4 mg total) under the tongue every 5 (five) minutes as needed for chest pain.  . [DISCONTINUED] pantoprazole (PROTONIX) 20 MG tablet Take 1 tablet (20 mg total) by mouth daily.     Allergies:   Benadryl [diphenhydramine hcl]   Social History   Socioeconomic History  . Marital status: Married    Spouse name: Not on file  . Number of children: Not on file  . Years of education: Not on file  . Highest education level: Not on file  Occupational History  . Not on file  Tobacco Use  . Smoking status: Never Smoker  . Smokeless tobacco: Never Used  Substance and Sexual Activity  . Alcohol use: No  . Drug use: No  . Sexual activity: Yes  Other Topics Concern  . Not on file  Social History Narrative   ** Merged History Encounter **       Holter monitor 08/2010: PVCs and sinus tachy.   Sleep Study (02/2008): mild sleep apnea, no indication for CPAP.   Social Determinants of Health   Financial Resource Strain:   . Difficulty of Paying Living Expenses:   Food Insecurity:   . Worried About Charity fundraiser in the Last  Year:   . [place] in the  Last Year:   Transportation Needs:   . Film/video editor (Medical):   Marland Kitchen Lack of Transportation (Non-Medical):   Physical Activity:   . Days of Exercise per Week:   . Minutes of Exercise per Session:   Stress:   . Feeling of Stress :   Social Connections:   . Frequency of Communication with Friends and Family:   . Frequency of Social Gatherings with Friends and Family:   . Attends Religious Services:   . Active Member of Clubs or Organizations:   . Attends Archivist Meetings:   Marland Kitchen Marital Status:      Family History: The patient's family history includes Allergies in his daughter; Aneurysm in his father; Breast cancer in his maternal grandmother; Colon cancer in his paternal grandfather and paternal uncle; Colon polyps in his mother; Coronary artery disease in his father and mother; Heart attack (age of onset: 50) in his father; Heart disease in his mother; Liver disease in an other family member; Migraines in his mother; Prostate cancer in his paternal grandfather; Stomach cancer in his brother.  ROS:   Please see the history of present illness.    All other systems reviewed and are negative.  EKGs/Labs/Other Studies Reviewed:    The following studies were reviewed today:  Event Monitor 01/12/2016 to 02/10/2016:  Normal sinus rhythm and mild sinus tachycardia.  No correlation between patient symptoms and rhythm changes   Normal event monitor. _______________  Echocardiogram 12/15/2018: Impressions: 1. The left ventricle has normal systolic function with an ejection  fraction of 60-65%. The cavity size was normal. Left ventricular diastolic  function could not be evaluated due to indeterminent diastolic function.  2. The right ventricle has normal systolic function. The cavity was  normal. There is no increase in right ventricular wall thickness. Right  ventricular systolic pressure could not be assessed.  3. No evidence  present in the left atrial appendage.  4. No pulmonic valve vegetation visualized.  _______________  Coronary CTA 02/26/2019: Impression: 1. Calcium Score 0 2.  Normal right dominant coronary arteries 3. Previous PDA repair with no residual communication or aneurysm. Normal aortic root 2.6 cm  EKG:  EKG ordered today. EKG personally reviewed and demonstrates normal sinus rhythm, rate 91 bpm, with incomplete RBBB. Elevated J point in leads V2 and V3 but unchanged from prior EKGs.   Recent Labs: 08/21/2019: TSH 1.11 12/28/2019: ALT 21; BUN 18; Creatinine, Ser 0.95; Hemoglobin 15.1; Platelets 286; Potassium 4.0; Sodium 139  Recent Lipid Panel    Component Value Date/Time   CHOL 154 08/21/2019 0953   CHOL 82 (L) 04/19/2019 0832   TRIG 192.0 (H) 08/21/2019 0953   HDL 38.20 (L) 08/21/2019 0953   HDL 31 (L) 04/19/2019 0832   CHOLHDL 4 08/21/2019 0953   VLDL 38.4 08/21/2019 0953   LDLCALC 77 08/21/2019 0953   LDLCALC 32 04/19/2019 0832   LDLDIRECT 36 04/19/2019 0832   LDLDIRECT 119.0 07/31/2018 1044    Physical Exam:    Vital Signs: BP 110/68 Comment: right  Pulse 91   Ht 5\' 8"  (1.727 m)   Wt 194 lb (88 kg)   BMI 29.50 kg/m     Wt Readings from Last 3 Encounters:  01/21/20 194 lb (88 kg)  12/28/19 188 lb (85.3 kg)  11/12/19 189 lb (85.7 kg)     General: 49 y.o. male in no acute distress. HEENT: Normocephalic and atraumatic. Sclera clear. EOMs intact. Neck: Supple. No carotid bruits.  Heart: RRR. Distinct  S1 and S2. No murmurs, gallops, or rubs. Radial pulses 2+ and equal bilaterally. Lungs: No increased work of breathing. Expiratory wheezes/rhonchi noted. No crackles.  Abdomen: Soft and non-distended with mild epigastric tenderness with palpation.  Extremities: No lower extremity edema.    Skin: Warm and dry. Neuro: Alert and oriented x3. No focal deficits. Psych: Normal affect. Responds appropriately.  Assessment:    1. Chest pain, unspecified type   2. Coronary  artery disease involving native coronary artery of native heart without angina pectoris   3. Palpitations   4. Thoracic aortic aneurysm without rupture ([place])   5. Essential hypertension   6. Hyperlipidemia, unspecified hyperlipidemia type   7. Type 2 diabetes mellitus with complication, without long-term current use of insulin ([place])   8. H/O gastroesophageal reflux (GERD)   9. Uncomplicated asthma, unspecified asthma severity, unspecified whether persistent     Plan:    Non-Obstructive CAD with Persistent Chest Pain / GERD - Non-obstructive CAD noted on cardiac cath in 2011. However, coronary CTA in 02/2019 showed coronary calcium score of 0 and normal coronaries.  - EKG shows normal sinus rhythm with no acute ST/T changes compared to prior tracings. - Patient has continued chest pain with some atypical and typical features. Usually responsive to Nitro. Normal coronary CTA less than 1 year ago is very reassuring. Do not think this is cardiac chest pain. However, he does not improvement with Imdur in the past but this was discontinued due to soft BP and dizziness. Will discontinue Lisinopril and restart Imdur 15mg  daily to see if he can tolerate this. - Continue primary prevention with aspirin and statin.  - Of note, he does note some pleuritic nature to his pain. Discussed with primary Cardiologist (Dr. Gwenlyn Raymond) about whether it would be worth getting D-dimer to rule out PE even though I have very low suspicion for this. Not felt to be necessary at this time.  - Suspect possible GI etiology - will increase Protonix to 40mg  daily and refer to GI.   Palpitations - Continue to note resting tachycardia but this is often when he is in pain.  - Continue Lopresor 50mg  twice daily.  Thoracic Aortic Aneurysm - Most recent CTA on 12/28/2019 showed unchanged prominence of the distal arch/proximal descending thoracic aorta at the level of the ductus arteriosus measuring 3.1 cm.   Hypertension - BP well  controlled and actually on the softer side today. - Will discontinue Lisinopril so that patient can hopefully tolerate re-initiation of Imdur. Patient states previously his BP has skyrocketed when Lisinopril has been stopped. Patient to monitor BP at home. If this happens and patient is not having significant dizziness/lightheadedness with the Imdur, consider adding Amlodipine. - Continue Lopressor 50mg  twice daily.   Hyperlipidemia - Most recent lipid panel from 08/2019: Total Cholesterol 154, Triglycerides 192, HDL 38, LDL 77.  - Continue Lipitor 80mg  daily and Zetia 10mg  daily.   Type 2 Diabetes Mellitus  - Management per PCP.  Asthma with Dyspnea - Patient does have diffuse expiratory wheezing/rhonchi on exam. He does note some associated shortness of breath with chest pain recently. Also notes some trouble breathing at night but do not think this is true orthopnea or PND. Do not think this is CHF. No crackles on exam and no signs of volume overload.  - Recommend using inhalers when he gets home.  - Follow-up with PCP if no improvement in breathing.   Disposition: Follow up in 1 month with Dr. Gwenlyn Raymond.   Medication  Adjustments/Labs and Tests Ordered: Current medicines are reviewed at length with the patient today.  Concerns regarding medicines are outlined above.  Orders Placed This Encounter  Procedures  . Ambulatory referral to Gastroenterology  . EKG 12-Lead   Meds ordered this encounter  Medications  . isosorbide mononitrate (IMDUR) 30 MG 24 hr tablet    Sig: TAKE 1/2 TABLET BY MOUTH DAILY    Dispense:  45 tablet    Refill:  1  . pantoprazole (PROTONIX) 40 MG tablet    Sig: Take 1 tablet (40 mg total) by mouth daily.    Dispense:  90 tablet    Refill:  1    NEW DOSE  . nitroGLYCERIN (NITROSTAT) 0.4 MG SL tablet    Sig: Place 1 tablet (0.4 mg total) under the tongue every 5 (five) minutes as needed for chest pain.    Dispense:  25 tablet    Refill:  PRN    Patient  Instructions  Medication Instructions:  STOP LISINOPRIL   START ISOSORBIDE 30 MG 1/2 TABLET BY MOUTH DAILY   INCREASE YOUR PANTOPRAZOLE TO 40 MG DAILY   *If you need a refill on your cardiac medications before your next appointment, please call your pharmacy*  Lab Work: NONE   Testing/Procedures: NONE  Follow-Up: At [facility], you and your health needs are our priority.  As part of our continuing mission to provide you with exceptional heart care, we have created designated Provider Care Teams.  These Care Teams include your primary Cardiologist (physician) and Advanced Practice Providers (APPs -  Physician Assistants and Nurse Practitioners) who all work together to provide you with the care you need, when you need it.  We recommend signing up for the patient portal called "MyChart".  Sign up information is provided on this After Visit Summary.  MyChart is used to connect with patients for Virtual Visits (Telemedicine).  Patients are able to view lab/test results, encounter notes, upcoming appointments, etc.  Non-urgent messages can be sent to your provider as well.   To learn more about what you can do with MyChart, go to [email or web address].    Your next appointment:   1 MONTH WITH DR Cody Raymond   You have been referred to   Where: [facility] Address: [place] [address] Phone: [phone] IF YOU DO NOT HEAR FROM THEM IN 1 WEEK CALL THEM DIRECTLY AT THE ABOVE NUMBER   Other Instructions   MONITOR AND LOG YOUR BLOOD PRESSURE AT HOME, [facility] READINGS TO YOUR FOLLOW UP     Signed, Darreld Mclean, PA-C  01/21/2020 6:05 PM    [facility]

## 2020-01-21 ENCOUNTER — Other Ambulatory Visit: Payer: Self-pay

## 2020-01-21 ENCOUNTER — Ambulatory Visit: Payer: Medicare Other | Admitting: Student

## 2020-01-21 ENCOUNTER — Encounter: Payer: Self-pay | Admitting: Student

## 2020-01-21 VITALS — BP 110/68 | HR 91 | Ht 68.0 in | Wt 194.0 lb

## 2020-01-21 DIAGNOSIS — I712 Thoracic aortic aneurysm, without rupture, unspecified: Secondary | ICD-10-CM

## 2020-01-21 DIAGNOSIS — E118 Type 2 diabetes mellitus with unspecified complications: Secondary | ICD-10-CM

## 2020-01-21 DIAGNOSIS — J45909 Unspecified asthma, uncomplicated: Secondary | ICD-10-CM

## 2020-01-21 DIAGNOSIS — I2510 Atherosclerotic heart disease of native coronary artery without angina pectoris: Secondary | ICD-10-CM

## 2020-01-21 DIAGNOSIS — R002 Palpitations: Secondary | ICD-10-CM

## 2020-01-21 DIAGNOSIS — I10 Essential (primary) hypertension: Secondary | ICD-10-CM | POA: Diagnosis not present

## 2020-01-21 DIAGNOSIS — E785 Hyperlipidemia, unspecified: Secondary | ICD-10-CM

## 2020-01-21 DIAGNOSIS — R079 Chest pain, unspecified: Secondary | ICD-10-CM

## 2020-01-21 DIAGNOSIS — Z8719 Personal history of other diseases of the digestive system: Secondary | ICD-10-CM

## 2020-01-21 MED ORDER — NITROGLYCERIN 0.4 MG SL SUBL: 0.4000 mg | SUBLINGUAL_TABLET | SUBLINGUAL | 99 refills | Status: DC | PRN

## 2020-01-21 MED ORDER — ISOSORBIDE MONONITRATE ER 30 MG PO TB24
ORAL_TABLET | ORAL | 1 refills | Status: DC
Start: 2020-01-21 — End: 2022-06-30

## 2020-01-21 MED ORDER — PANTOPRAZOLE SODIUM 40 MG PO TBEC: 40.0000 mg | DELAYED_RELEASE_TABLET | Freq: Every day | ORAL | 1 refills | Status: DC

## 2020-01-21 NOTE — Patient Instructions (Addendum)
Medication Instructions:  STOP LISINOPRIL   START ISOSORBIDE 30 MG 1/2 TABLET BY MOUTH DAILY   INCREASE YOUR PANTOPRAZOLE TO 40 MG DAILY   *If you need a refill on your cardiac medications before your next appointment, please call your pharmacy*  Lab Work: NONE   Testing/Procedures: NONE  Follow-Up: At [facility], you and your health needs are our priority.  As part of our continuing mission to provide you with exceptional heart care, we have created designated Provider Care Teams.  These Care Teams include your primary Cardiologist (physician) and Advanced Practice Providers (APPs -  Physician Assistants and Nurse Practitioners) who all work together to provide you with the care you need, when you need it.  We recommend signing up for the patient portal called "MyChart".  Sign up information is provided on this After Visit Summary.  MyChart is used to connect with patients for Virtual Visits (Telemedicine).  Patients are able to view lab/test results, encounter notes, upcoming appointments, etc.  Non-urgent messages can be sent to your provider as well.   To learn more about what you can do with MyChart, go to [email or web address].    Your next appointment:   1 MONTH WITH DR Gwenlyn Found   You have been referred to   Where: [facility] Address: [place] [address] Phone: [phone] IF YOU DO NOT HEAR FROM THEM IN 1 WEEK CALL THEM DIRECTLY AT THE ABOVE NUMBER   Other Instructions   MONITOR AND LOG YOUR BLOOD PRESSURE AT HOME, BRING READINGS TO YOUR FOLLOW UP

## 2020-01-23 ENCOUNTER — Encounter: Payer: Self-pay | Admitting: Nurse Practitioner

## 2020-01-28 ENCOUNTER — Encounter: Payer: Self-pay | Admitting: Physical Medicine & Rehabilitation

## 2020-01-28 ENCOUNTER — Encounter: Payer: Medicare Other | Attending: Physical Medicine & Rehabilitation | Admitting: Physical Medicine & Rehabilitation

## 2020-01-28 ENCOUNTER — Other Ambulatory Visit: Payer: Self-pay

## 2020-01-28 VITALS — BP 112/82 | HR 81 | Temp 97.7°F | Ht 68.0 in | Wt 193.0 lb

## 2020-01-28 DIAGNOSIS — Z79891 Long term (current) use of opiate analgesic: Secondary | ICD-10-CM | POA: Insufficient documentation

## 2020-01-28 DIAGNOSIS — Z5181 Encounter for therapeutic drug level monitoring: Secondary | ICD-10-CM | POA: Diagnosis not present

## 2020-01-28 DIAGNOSIS — G894 Chronic pain syndrome: Secondary | ICD-10-CM | POA: Diagnosis not present

## 2020-01-28 MED ORDER — TRAMADOL HCL 50 MG PO TABS: 50.0000 mg | ORAL_TABLET | Freq: Two times a day (BID) | ORAL | 5 refills | Status: DC

## 2020-01-28 NOTE — Patient Instructions (Signed)

## 2020-01-28 NOTE — Progress Notes (Signed)
Subjective:    Patient ID: Cody Raymond, male    DOB: [birth year], 49 y.o.   MRN: [id number]  HPI  49 year old male with history of work-related injury in 2012 that was subsequently settled.  He underwent decompressive laminectomy and microdiscectomy foraminotomies right L5 4-L5.  Preoperatively he had ankle dorsiflexor and toe extensor weakness.  Orthopedics gave him permanent restrictions lifting no greater than 5 pounds no stooping or bending has gone through PT in the past.  Last visit with me was 08/09/2019.  He was switched from tramadol 50 mg twice daily to tramadol ER 100 mg daily.  Now because of insurance the price of tramadol ER went up quite a bit.  After running out of tramadol he has some withdrawal symptoms including nausea and vomiting.  No hospitalization is required The patient walks about 3 days a week about 15 minutes at a time    He has chronic low back pain. Pain Inventory Average Pain 9 Pain Right Now 9 My pain is sharp, burning, dull, stabbing, tingling and aching  In the last 24 hours, has pain interfered with the following? General activity 3 Relation with others 4 Enjoyment of life 4 What TIME of day is your pain at its worst? all Sleep (in general) Poor  Pain is worse with: walking, bending, sitting, inactivity, standing and some activites Pain improves with: rest, heat/ice and medication Relief from Meds: 3  Mobility walk with assistance use a cane how many minutes can you walk? 15 ability to climb steps?  no do you drive?  yes Do you have any goals in this area?  yes  Function disabled: date disabled . I need assistance with the following:  bathing  Neuro/Psych bowel control problems weakness numbness tremor trouble walking spasms depression anxiety  Prior Studies Any changes since last visit?  no  Physicians involved in your care Any changes since last visit?  no   Family History  Problem Relation Age of Onset  . Colon  cancer Paternal Grandfather   . Prostate cancer Paternal Grandfather   . Aneurysm Father   . Heart attack Father 35  . Coronary artery disease Father   . Colon polyps Mother   . Heart disease Mother   . Coronary artery disease Mother   . Migraines Mother   . Breast cancer Maternal Grandmother   . Colon cancer Paternal Uncle        x 2  . Stomach cancer Brother   . Liver disease Other        unsure who it was  . Allergies Daughter    Social History   Socioeconomic History  . Marital status: Married    Spouse name: Not on file  . Number of children: Not on file  . Years of education: Not on file  . Highest education level: Not on file  Occupational History  . Not on file  Tobacco Use  . Smoking status: Never Smoker  . Smokeless tobacco: Never Used  Substance and Sexual Activity  . Alcohol use: No  . Drug use: No  . Sexual activity: Yes  Other Topics Concern  . Not on file  Social History Narrative   ** Merged History Encounter **       Holter monitor 08/2010: PVCs and sinus tachy.   Sleep Study (02/2008): mild sleep apnea, no indication for CPAP.   Social Determinants of Health   Financial Resource Strain:   . Difficulty of Paying Living Expenses:   Food  Insecurity:   . Worried About Charity fundraiser in the Last Year:   . Arboriculturist in the Last Year:   Transportation Needs:   . Film/video editor (Medical):   Marland Kitchen Lack of Transportation (Non-Medical):   Physical Activity:   . Days of Exercise per Week:   . Minutes of Exercise per Session:   Stress:   . Feeling of Stress :   Social Connections:   . Frequency of Communication with Friends and Family:   . Frequency of Social Gatherings with Friends and Family:   . Attends Religious Services:   . Active Member of Clubs or Organizations:   . Attends Archivist Meetings:   Marland Kitchen Marital Status:    Past Surgical History:  Procedure Laterality Date  . APPENDECTOMY    . BACK SURGERY    . CARDIAC  CATHETERIZATION  2007   Clean Cardiac Cath ([place]), with RCA 30% narrowing likely due to catheter induced spasm in 09/02/10.  Marland Kitchen CARDIAC CATHETERIZATION  09/02/2010   mod. nonobstructive disease in the RCA and CX, tortuous LAD  . COLONOSCOPY W/ POLYPECTOMY  03/17/11   diminutive polyp  . LOOP RECORDER EXPLANT N/A 01/14/2014   Procedure: LOOP RECORDER EXPLANT;  Surgeon: Sanda Klein, MD;  Location: [facility]ATH [facility];  Service: Cardiovascular;  Laterality: N/A;  . LOOP RECORDER IMPLANT  04/06/2012   Reveal XT [id number]  . LOOP RECORDER IMPLANT N/A 04/06/2012   Procedure: LOOP RECORDER IMPLANT;  Surgeon: Sanda Klein, MD;  Location: [facility];  Service: Cardiovascular;  Laterality: N/A;  . NM MYOCAR PERF WALL MOTION  01/25/2008   mild anteroapical wall ischemia  . PATENT DUCTUS ARTERIOUS REPAIR     at age 30   Past Medical History:  Diagnosis Date  . Aneurysm ([place])   . Asthma   . Blood transfusion   . CAD (coronary artery disease), non obstructive on cath 2011 04/05/2012  . Coronary artery disease   . Diabetes mellitus   . Diabetes mellitus without complication ([place])   . Enlarged prostate   . GERD (gastroesophageal reflux disease)   . H/O syncope   . Heart disease   . History of repair of patent ductus arteriosus 07/23/2016  . Hyperlipidemia   . Hypertension   . 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 Refusal of blood transfusions as patient is Jehovah's Witness    There were no vitals taken for this visit.  Opioid Risk Score:   Fall Risk Score:  `1  Depression screen PHQ 2/9  Depression screen [facility] 2/9 08/21/2019 07/31/2018 04/25/2018 02/01/2018 02/01/2018 01/10/2018 07/25/2017  Decreased Interest 0 2 2 2 2 2 2   Down, Depressed, Hopeless 0 2 1 2 2 2 2   PHQ - 2 Score 0 4 3 4 4 4 4   Altered sleeping - 2 2 - - 2 3  Tired, decreased energy - 2 2 - - 2 2  Change in appetite - 1 1 - - 2 2  Feeling bad or failure about yourself  - 2 2 - - 2 2  Trouble concentrating - 2 1 - - 1 2    Moving slowly or fidgety/restless - 1 0 - - 2 1  Suicidal thoughts - 0 0 - - 0 0  PHQ-9 Score - 14 11 - - 15 16  Difficult doing work/chores - Very difficult Very difficult - - Somewhat difficult -  Some recent data might be hidden  Review of Systems  Constitutional: Negative.   HENT: Negative.   Eyes: Negative.   Respiratory: Positive for cough, shortness of breath and wheezing.   Cardiovascular: Negative.   Gastrointestinal: Positive for abdominal pain, constipation, diarrhea and nausea.  Endocrine: Negative.        High blood sugar  Genitourinary: Negative.   Musculoskeletal: Positive for arthralgias, back pain and neck pain.       Spasms   Skin: Negative.   Allergic/Immunologic: Negative.   Neurological: Positive for numbness.  Hematological: Negative.   Psychiatric/Behavioral: Negative.   All other systems reviewed and are negative.      Objective:   Physical Exam  Patient has tenderness palpation in the lumbar paraspinals mainly on the right side. Motor strength is 4/5 in the right ankle dorsiflexor otherwise 5/5 bilateral lower extremities and hip flexor knee extensor plantar flexors. Ambulates with a cane no evidence of toe drop knee instability. Mood and affect are appropriate Upper extremity strength is normal      Assessment & Plan:   1.  Lumbar postlaminectomy syndrome with chronic low back pain.  Continue tramadol 50 mg twice daily  #2.  Chronic right L4 radiculopathy mild ankle dorsiflexor weakness no need for AFO  #3.  Fibromyalgia syndrome encourage ambulation I encouraged him to ambulate every day rather than just 3 times per week.  He states that he is still afraid of Covid we discussed that his risk with getting Covid outdoors socially distanced is very minimal especially since he has had vaccination.  Return to clinic 6 months

## 2020-02-03 ENCOUNTER — Encounter (HOSPITAL_COMMUNITY): Payer: Self-pay

## 2020-02-03 ENCOUNTER — Emergency Department (HOSPITAL_COMMUNITY)
Admission: EM | Admit: 2020-02-03 | Discharge: 2020-02-04 | Disposition: A | Discharge status: Home / Self Care | Payer: Medicare Other | Attending: Emergency Medicine | Admitting: Emergency Medicine

## 2020-02-03 ENCOUNTER — Emergency Department (HOSPITAL_COMMUNITY): Payer: Medicare Other

## 2020-02-03 ENCOUNTER — Other Ambulatory Visit: Payer: Self-pay

## 2020-02-03 DIAGNOSIS — Z5321 Procedure and treatment not carried out due to patient leaving prior to being seen by health care provider: Secondary | ICD-10-CM | POA: Insufficient documentation

## 2020-02-03 DIAGNOSIS — Z743 Need for continuous supervision: Secondary | ICD-10-CM | POA: Diagnosis not present

## 2020-02-03 DIAGNOSIS — R079 Chest pain, unspecified: Secondary | ICD-10-CM | POA: Insufficient documentation

## 2020-02-03 DIAGNOSIS — I499 Cardiac arrhythmia, unspecified: Secondary | ICD-10-CM | POA: Diagnosis not present

## 2020-02-03 DIAGNOSIS — R0789 Other chest pain: Secondary | ICD-10-CM | POA: Diagnosis not present

## 2020-02-03 DIAGNOSIS — R0902 Hypoxemia: Secondary | ICD-10-CM | POA: Diagnosis not present

## 2020-02-03 LAB — BASIC METABOLIC PANEL
Anion gap: 12 (ref 5–15)
BUN: 10 mg/dL (ref 6–20)
CO2: 23 mmol/L (ref 22–32)
Calcium: 8.8 mg/dL — ABNORMAL LOW (ref 8.9–10.3)
Chloride: 101 mmol/L (ref 98–111)
Creatinine, Ser: 0.99 mg/dL (ref 0.61–1.24)
GFR calc Af Amer: >60 mL/min (ref >60)
GFR calc non Af Amer: >60 mL/min (ref >60)
Glucose, Bld: 122 mg/dL — ABNORMAL HIGH (ref 70–99)
Potassium: 4.1 mmol/L (ref 3.5–5.1)
Sodium: 136 mmol/L (ref 135–145)

## 2020-02-03 LAB — CBC
HCT: 41.8 % (ref 39.0–52.0)
Hemoglobin: 13.6 g/dL (ref 13.0–17.0)
MCH: 26.5 pg (ref 26.0–34.0)
MCHC: 32.5 g/dL (ref 30.0–36.0)
MCV: 81.3 fL (ref 80.0–100.0)
Platelets: 249 10*3/uL (ref 150–400)
RBC: 5.14 MIL/uL (ref 4.22–5.81)
RDW: 16 % — ABNORMAL HIGH (ref 11.5–15.5)
WBC: 6.7 10*3/uL (ref 4.0–10.5)
nRBC: 0 % (ref 0.0–0.2)

## 2020-02-03 LAB — TROPONIN I (HIGH SENSITIVITY)
Troponin I (High Sensitivity): 5 ng/L (ref <18)
Troponin I (High Sensitivity): 5 ng/L (ref <18)

## 2020-02-03 NOTE — ED Notes (Signed)
IV REMOVED AND PATIENT LEFT

## 2020-02-03 NOTE — ED Notes (Signed)
Patient states his chest is hurting and he is tired of waiting here. Advised patient he has a higher acuity and it might not be so much longer but he states his updated vitals are fine and he will go home and call an ambulance if he needs [place]. Patient leaving [place]

## 2020-02-03 NOTE — ED Triage Notes (Signed)
Pt from home with ems for chest pain that started around noon today, initially pain 10/10, took 324 ASA and 1 nitro at home and ems gave 1 nitro en oute and 538ml saline. Pain now 8/10. Pt pale in triage. 18G LFA

## 2020-02-04 ENCOUNTER — Telehealth: Payer: Self-pay | Admitting: Radiology

## 2020-02-04 ENCOUNTER — Other Ambulatory Visit: Payer: Self-pay

## 2020-02-04 ENCOUNTER — Encounter: Payer: Self-pay | Admitting: Cardiovascular Disease

## 2020-02-04 ENCOUNTER — Ambulatory Visit: Payer: Medicare Other | Admitting: Cardiovascular Disease

## 2020-02-04 ENCOUNTER — Telehealth: Payer: Self-pay | Admitting: Cardiovascular Disease

## 2020-02-04 VITALS — BP 80/56 | HR 78 | Ht 68.0 in | Wt 191.4 lb

## 2020-02-04 DIAGNOSIS — I712 Thoracic aortic aneurysm, without rupture, unspecified: Secondary | ICD-10-CM

## 2020-02-04 DIAGNOSIS — R000 Tachycardia, unspecified: Secondary | ICD-10-CM

## 2020-02-04 DIAGNOSIS — E785 Hyperlipidemia, unspecified: Secondary | ICD-10-CM

## 2020-02-04 DIAGNOSIS — I25119 Atherosclerotic heart disease of native coronary artery with unspecified angina pectoris: Secondary | ICD-10-CM

## 2020-02-04 DIAGNOSIS — R0789 Other chest pain: Secondary | ICD-10-CM

## 2020-02-04 DIAGNOSIS — I10 Essential (primary) hypertension: Secondary | ICD-10-CM

## 2020-02-04 DIAGNOSIS — R002 Palpitations: Secondary | ICD-10-CM

## 2020-02-04 NOTE — Assessment & Plan Note (Signed)
History of dyslipidemia on Vascepa and atorvastatin with lipid profile performed 08/21/2019 revealing total cholesterol 154, LDL 77 and HDL 38.

## 2020-02-04 NOTE — Telephone Encounter (Signed)
Enrolled patient for a 14 day Zio monitor to be mailed to patients home.  

## 2020-02-04 NOTE — Progress Notes (Signed)
02/04/2020 Cody Raymond   01-24-1971  [id number]  Primary Physician [name], NP Primary Cardiologist: Lorretta Harp MD Cody Raymond, [place]  HPI:  Cody Raymond is a 49 y.o.  mild to moderately overweight married Latino male father of 2 daughters, grandfather 2 grandchildren who I last saw in the office 07/23/2019.He is referred to be established in our practice for ongoing cardiovascular care because of chest pain. He was previously a patient of Dr. Sallyanne Raymond , then Dr. Einar Raymond and now is transferring to me because of insurance issues. His cardiac risk factors are notable for treated hypertension, diabetes and hyperlipidemia. He is a Restaurant manager, fast food. He is never smoked. His mother did have CABG. He is never had a heart attack or stroke. Does get fairly constant daily chest pain. He had a heart cath in 2011 that showed nonobstructive CAD in the circumflex and RCA and negative Myoview in 2016. His mother did have HOCM and had a septal myectomy. He has LVH without asymmetric thickening. He has been out of work for over 6 years and has been disabled. He has had syncope in the past with a negative work-up including loop implantation and explantation. He was recently seen in the emergency room with chest pain 10/06/2018 with negative work-up.    He was hospitalized for 1 day 12/14/2018 with atypical chest pain and ruled out for myocardial infarction.  He sees saw Cody Raymond in the office 04/19/2019.  His major complaint is of occasional tachycardia.  He has rare atypical chest pain and has been on Imdur.   He had a coronary CTA performed 02/26/2019 that revealed a coronary calcium score of 0 and completely normal coronary arteries.  Since I saw him 6 months ago he had multiple ER visits for chest pain and tachycardia.  He takes sublingual nitroglycerin.  He says his heart rate is in the low 100 range for no apparent reason.  He did wear a loop recorder for 3 years post  syncope without any arrhythmias noted.    Current Meds  Medication Sig  . albuterol (PROVENTIL) (2.5 MG/3ML) 0.083% nebulizer solution USE 1 VIAL VIA NEBULIZER  EVERY 6 HOURS AS NEEDED FOR WHEEZING OR SHORTNESS OF  BREATH (Patient taking differently: Take 2.5 mg by nebulization every 6 (six) hours as needed for wheezing. )  . Ascorbic Acid (VITAMIN C) 1000 MG tablet Take 1,000 mg by mouth daily.  Marland Kitchen aspirin EC 81 MG EC tablet Take 1 tablet (81 mg total) by mouth daily.  Marland Kitchen atorvastatin (LIPITOR) 80 MG tablet TAKE 1 TABLET BY MOUTH ONCE DAILY (Patient taking differently: Take 80 mg by mouth daily at 6 PM. )  . betamethasone dipropionate (DIPROLENE) 0.05 % cream Apply topically 2 (two) times daily.  . cetirizine (ZYRTEC) 10 MG tablet Take 10 mg by mouth daily.  . clonazePAM (KLONOPIN) 1 MG tablet Take 1 tablet (1 mg total) by mouth daily.  . diclofenac sodium (VOLTAREN) 1 % GEL Apply 2 g topically 4 (four) times daily as needed (pain).  . DULoxetine (CYMBALTA) 30 MG capsule TAKE 1 CAPSULE BY MOUTH  DAILY  . ezetimibe (ZETIA) 10 MG tablet TAKE 1 TABLET BY MOUTH  DAILY  . fluticasone (FLONASE) 50 MCG/ACT nasal spray Place 2 sprays into both nostrils daily.  Marland Kitchen gabapentin (NEURONTIN) 300 MG capsule TAKE 1 CAPSULE BY MOUTH AT  6AM, 11:30AM AND 4:30PM AND TAKE 3 CAPSULES AT 10:00PM (Patient taking differently: Take 300-900 mg by mouth See admin instructions.  Take 1 capsule at 6 am, 11:30 am and 4:30 pm then take 3 capsules at 10 pm)  . Icosapent Ethyl (VASCEPA) 1 g CAPS Take 2 capsules (2 g total) by mouth 2 (two) times daily.  . isosorbide mononitrate (IMDUR) 30 MG 24 hr tablet TAKE 1/2 TABLET BY MOUTH DAILY  . ketoconazole (NIZORAL) 2 % shampoo APPLY TOPICALLY DAILY AS  DIRECTED  . meclizine (ANTIVERT) 25 MG tablet Take 1 tablet (25 mg total) by mouth 3 (three) times daily as needed for dizziness.  . meloxicam (MOBIC) 7.5 MG tablet TAKE 1 TABLET BY MOUTH  DAILY AS NEEDED FOR PAIN (Patient taking  differently: Take 7.5 mg by mouth daily as needed for pain. )  . metFORMIN (GLUCOPHAGE-XR) 500 MG 24 hr tablet TAKE 2 TABLETS BY MOUTH  DAILY WITH BREAKFAST (Patient taking differently: Take 500 mg by mouth 2 (two) times daily. )  . metoprolol tartrate (LOPRESSOR) 50 MG tablet TAKE 1 TABLET BY MOUTH  TWICE DAILY  . montelukast (SINGULAIR) 10 MG tablet TAKE 1 TABLET BY MOUTH AT  BEDTIME (Patient taking differently: Take 10 mg by mouth at bedtime. )  . Multiple Vitamin (MULTIVITAMIN WITH MINERALS) TABS tablet Take 1 tablet by mouth daily.  . nitroGLYCERIN (NITROSTAT) 0.4 MG SL tablet Place 1 tablet (0.4 mg total) under the tongue every 5 (five) minutes as needed for chest pain.  Marland Kitchen ondansetron (ZOFRAN) 4 MG tablet Take 1 tablet (4 mg total) by mouth every 8 (eight) hours as needed for nausea or vomiting.  Glory Rosebush VERIO test strip USE WITH METER TO CHECK  BLOOD SUGAR 3 TIMES DAILY  . pantoprazole (PROTONIX) 40 MG tablet Take 1 tablet (40 mg total) by mouth daily.  . silver sulfADIAZINE (SILVADENE) 1 % cream Apply 1 application topically daily.  . tamsulosin (FLOMAX) 0.4 MG CAPS capsule TAKE 1 CAPSULE BY MOUTH  DAILY  . tiZANidine (ZANAFLEX) 2 MG tablet TAKE 1 TABLET BY MOUTH  EVERY 6 HOURS AS NEEDED FOR MUSCLE SPASM(S) (Patient taking differently: Take 2 mg by mouth every 6 (six) hours as needed for muscle spasms. )  . traMADol (ULTRAM) 50 MG tablet Take 1 tablet (50 mg total) by mouth 2 (two) times daily.  . traZODone (DESYREL) 100 MG tablet TAKE 1 TABLET BY MOUTH AT  BEDTIME (Patient taking differently: Take 100 mg by mouth at bedtime. )  . zolpidem (AMBIEN) 10 MG tablet TAKE 1 TABLET BY MOUTH AT  BEDTIME AS NEEDED FOR SLEEP (Patient taking differently: Take 10 mg by mouth at bedtime as needed for sleep. )     Allergies  Allergen Reactions  . Benadryl [Diphenhydramine Hcl]     "drives me nuts"    Social History   Socioeconomic History  . Marital status: Married    Spouse name: Not on file   . Number of children: Not on file  . Years of education: Not on file  . Highest education level: Not on file  Occupational History  . Not on file  Tobacco Use  . Smoking status: Never Smoker  . Smokeless tobacco: Never Used  Substance and Sexual Activity  . Alcohol use: No  . Drug use: No  . Sexual activity: Yes  Other Topics Concern  . Not on file  Social History Narrative   ** Merged History Encounter **       Holter monitor 08/2010: PVCs and sinus tachy.   Sleep Study (02/2008): mild sleep apnea, no indication for CPAP.   Social Determinants  of Health   Financial Resource Strain:   . Difficulty of Paying Living Expenses:   Food Insecurity:   . Worried About Charity fundraiser in the Last Year:   . Arboriculturist in the Last Year:   Transportation Needs:   . Film/video editor (Medical):   Marland Kitchen Lack of Transportation (Non-Medical):   Physical Activity:   . Days of Exercise per Week:   . Minutes of Exercise per Session:   Stress:   . Feeling of Stress :   Social Connections:   . Frequency of Communication with Friends and Family:   . Frequency of Social Gatherings with Friends and Family:   . Attends Religious Services:   . Active Member of Clubs or Organizations:   . Attends Archivist Meetings:   Marland Kitchen Marital Status:   Intimate Partner Violence:   . Fear of Current or Ex-Partner:   . Emotionally Abused:   Marland Kitchen Physically Abused:   . Sexually Abused:      Review of Systems: General: negative for chills, fever, night sweats or weight changes.  Cardiovascular: negative for chest pain, dyspnea on exertion, edema, orthopnea, palpitations, paroxysmal nocturnal dyspnea or shortness of breath Dermatological: negative for rash Respiratory: negative for cough or wheezing Urologic: negative for hematuria Abdominal: negative for nausea, vomiting, diarrhea, bright red blood per rectum, melena, or hematemesis Neurologic: negative for visual changes, syncope, or  dizziness All other systems reviewed and are otherwise negative except as noted above.    Blood pressure (!) 80/56, pulse 78, height 5\' 8"  (1.727 m), weight 191 lb 6.4 oz (86.8 kg).  General appearance: alert and no distress Neck: no adenopathy, no carotid bruit, no JVD, supple, symmetrical, trachea midline and thyroid not enlarged, symmetric, no tenderness/mass/nodules Lungs: clear to auscultation bilaterally Heart: regular rate and rhythm, S1, S2 normal, no murmur, click, rub or gallop Extremities: extremities normal, atraumatic, no cyanosis or edema Pulses: 2+ and symmetric Skin: Skin color, texture, turgor normal. No rashes or lesions Neurologic: Alert and oriented X 3, normal strength and tone. Normal symmetric reflexes. Normal coordination and gait  EKG sinus rhythm 78 with incomplete right bundle branch block.  I personally reviewed this EKG.  ASSESSMENT AND PLAN:   CAD (coronary artery disease), non obstructive on cath 2011 Nonobstructive CAD cath in 2011 and coronary CTA performed in 2020 showed a coronary calcium score of 0 without evidence of CAD.  Patient continues to have frequent chest pain for which he takes sublingual nitroglycerin and was prescribed isosorbide.  I do not think his chest pain is ischemically mediated.  Palpitations Complains of chronic palpitations and inappropriate tachycardia.  He had a loop recorder placed for syncope for 3 years without evidence of arrhythmia and an event recorder 01/12/2016 that showed no significant arrhythmias.  I am going to put on a 2-week Zio patch to further evaluate  Essential hypertension History of essential hypertension on lisinopril metoprolol blood pressure measured today of 80/56 probably related to the sublingual nitroglycerin which he took for chest pain.  Dyslipidemia History of dyslipidemia on Vascepa and atorvastatin with lipid profile performed 08/21/2019 revealing total cholesterol 154, LDL 77 and HDL  38.  Thoracic aortic aneurysm ([place]) History of small thoracic aortic aneurysm measuring 3.1 cm by CTA September 2019 and again 12/28/2019, unchanged.  Chest pain History of ongoing chest pain of unclear etiology.  He has had a negative cath 10 years ago and a normal coronary calcium score and CTA a year  ago.  His pain apparently is responsive to sublingual nitroglycerin but there is no substrate for this.  He may have esophageal spasm or other GI etiology and has been scheduled to see a gastroenterologist.  This point I do not see a need to continue to treat with nitrates.      Lorretta Harp MD FACP,FACC,FAHA, [facility] 02/04/2020 3:50 PM

## 2020-02-04 NOTE — Assessment & Plan Note (Signed)
Nonobstructive CAD cath in 2011 and coronary CTA performed in 2020 showed a coronary calcium score of 0 without evidence of CAD.  Patient continues to have frequent chest pain for which he takes sublingual nitroglycerin and was prescribed isosorbide.  I do not think his chest pain is ischemically mediated.

## 2020-02-04 NOTE — Telephone Encounter (Signed)
Returned call to patient, patient states yesterday his blood pressure "skyrocketed", HR was 125-126,  shooting pain down left arm, and chest pain.   Reports his has never happened before.    He said his HR has been high in the past and it caused him to pass out.  He called EMS and sat in the waiting room at [facility] for 7.5 hours without being seen.   He decided to go home.   He took a total of 5 NTG yesterday, states he is unsure if this helped.   He went home, took his evening medication and this morning his BP and HR are "normal", HR 85.    Asymptomatic this AM but reports he "can tell something happened yesterday".   Denies chest pain this AM.    Denies SOB.        Last OV with Callie PA 5/4: States he did start the isosorbide and increased his protonix without improvement. He states he is still taking the lisinopril because his blood pressure has been high.  Referred to GI, not seen yet.    Is taking metoprolol 50 BID.      appt scheduled with Dr. Gwenlyn Found today at 3 pm.  Advised patient if symptoms return prior, then to he will need to go to ER for evaluation. Patient verbalized understanding.

## 2020-02-04 NOTE — Assessment & Plan Note (Signed)
History of small thoracic aortic aneurysm measuring 3.1 cm by CTA September 2019 and again 12/28/2019, unchanged.

## 2020-02-04 NOTE — Assessment & Plan Note (Signed)
Complains of chronic palpitations and inappropriate tachycardia.  He had a loop recorder placed for syncope for 3 years without evidence of arrhythmia and an event recorder 01/12/2016 that showed no significant arrhythmias.  I am going to put on a 2-week Zio patch to further evaluate

## 2020-02-04 NOTE — Patient Instructions (Signed)
Medication Instructions:  Your physician recommends that you continue on your current medications as directed. Please refer to the Current Medication list given to you today.  *If you need a refill on your cardiac medications before your next appointment, please call your pharmacy*   Lab Work: NONE ordered at this time of appointment   If you have labs (blood work) drawn today and your tests are completely normal, you will receive your results only by: Marland Kitchen MyChart Message (if you have MyChart) OR . A paper copy in the mail If you have any lab test that is abnormal or we need to change your treatment, we will call you to review the results.   Testing/Procedures: [place] [place]- Long Term Monitor Instructions   Your physician has requested you wear your ZIO patch monitor 14 days.   This is a single patch monitor.  [employer] supplies one patch monitor per enrollment.  Additional stickers are not available.   Please do not apply patch if you will be having a Nuclear Stress Test, Echocardiogram, Cardiac CT, MRI, or Chest Xray during the time frame you would be wearing the monitor. The patch cannot be worn during these tests.  You cannot remove and re-apply the ZIO XT patch monitor.   Your ZIO patch monitor will be sent USPS Priority mail from [facility] directly to your home address. The monitor may also be mailed to a PO BOX if home delivery is not available.   It may take 3-5 days to receive your monitor after you have been enrolled.   Once you have received you monitor, please review enclosed instructions.  Your monitor has already been registered assigning a specific monitor serial # to you.   Applying the monitor   Shave hair from upper left chest.   Hold abrader disc by orange tab.  Rub abrader in 40 strokes over left upper chest as indicated in your monitor instructions.   Clean area with 4 enclosed alcohol pads .  Use all pads to assure are is cleaned thoroughly.  Let dry.    Apply patch as indicated in monitor instructions.  Patch will be place under collarbone on left side of chest with arrow pointing upward.   Rub patch adhesive wings for 2 minutes.Remove white label marked "1".  Remove white label marked "2".  Rub patch adhesive wings for 2 additional minutes.   While looking in a mirror, press and release button in center of patch.  A small green light will flash 3-4 times .  This will be your only indicator the monitor has been turned on.     Do not shower for the first 24 hours.  You may shower after the first 24 hours.   Press button if you feel a symptom. You will hear a small click.  Record Date, Time and Symptom in the Patient Log Book.   When you are ready to remove patch, follow instructions on last 2 pages of Patient Log Book.  Stick patch monitor onto last page of Patient Log Book.   Place Patient Log Book in [place] box.  Use locking tab on box and tape box closed securely.  The [employer] has [employer] on it.  Please place in mailbox as soon as possible.  Your physician should have your test results approximately 7 days after the monitor has been mailed back to [facility].   Call [place] at [phone] if you have questions regarding your ZIO XT patch  monitor.  Call them immediately if you see an orange light blinking on your monitor.   If your monitor falls off in less than 4 days contact our Monitor department at [phone].  If your monitor becomes loose or falls off after 4 days call [employer] at [phone] for suggestions on securing your monitor.    Follow-Up: At [facility], you and your health needs are our priority.  As part of our continuing mission to provide you with exceptional heart care, we have created designated Provider Care Teams.  These Care Teams include your primary Cardiologist (physician) and Advanced Practice Providers (APPs -  Physician Assistants and Nurse Practitioners) who  all work together to provide you with the care you need, when you need it.  We recommend signing up for the patient portal called "MyChart".  Sign up information is provided on this After Visit Summary.  MyChart is used to connect with patients for Virtual Visits (Telemedicine).  Patients are able to view lab/test results, encounter notes, upcoming appointments, etc.  Non-urgent messages can be sent to your provider as well.   To learn more about what you can do with MyChart, go to [email or web address].    Your next appointment:   4-6 week(s)  The format for your next appointment:   In Person  Provider:   Quay Burow, MD  Other Instructions

## 2020-02-04 NOTE — Assessment & Plan Note (Signed)
History of essential hypertension on lisinopril metoprolol blood pressure measured today of 80/56 probably related to the sublingual nitroglycerin which he took for chest pain.

## 2020-02-04 NOTE — Telephone Encounter (Signed)
STAT if HR is under 50 or over 120 (normal HR is 60-100 beats per minute)  1) What is your heart rate? 85  2) Do you have a log of your heart rate readings (document readings)? 126, 120, 121, 111, 112         3) Do you have any other symptoms? No denies  symptoms right now but he states he did take  some nitroglycerin. Patient states that his heart   rate was racing all day yesterday, he had some  dizziness, headaches, SOB and chest pain.  He called the ambulance and at the time his BP  was 150/110 and his heart rate was around  125-126. He got to the hospital and it was at  120-121 and waited for 7 hours - his heart rate  then slowed down to 111-112. He was told it  would be another 4 hours before he could be  seen so he left. He states that right now his heart  rate is 85 and he is denying any symptoms.

## 2020-02-04 NOTE — Assessment & Plan Note (Signed)
History of ongoing chest pain of unclear etiology.  He has had a negative cath 10 years ago and a normal coronary calcium score and CTA a year ago.  His pain apparently is responsive to sublingual nitroglycerin but there is no substrate for this.  He may have esophageal spasm or other GI etiology and has been scheduled to see a gastroenterologist.  This point I do not see a need to continue to treat with nitrates.

## 2020-02-07 ENCOUNTER — Other Ambulatory Visit (INDEPENDENT_AMBULATORY_CARE_PROVIDER_SITE_OTHER): Payer: Medicare Other

## 2020-02-07 DIAGNOSIS — R000 Tachycardia, unspecified: Secondary | ICD-10-CM | POA: Diagnosis not present

## 2020-02-07 DIAGNOSIS — R002 Palpitations: Secondary | ICD-10-CM | POA: Diagnosis not present

## 2020-02-09 NOTE — Progress Notes (Deleted)
02/09/2020 Cody Raymond [id number] 1971-02-03   CHIEF COMPLAINT:   HISTORY OF PRESENT ILLNESS:  Cody Raymond is a 49 year old male with a past medical history of asthma, hypertension, coronary artery disease, DM II He is a Jehovah witness  He was referred to our office by his PCP [name] for further evaluation regarding chest pain. He was evaluated by cardiologist, Dr. Quay Burow on 02/04/2020. He had a heart cath in 2011 that showed nonobstructive CAD in the circumflex. His chest pain was assessed to be noncardiac, possible esophageal spasms. He was hospitalized for 1 day 12/14/2018 with atypical chest pain and ruled out for myocardial infarction. He sees saw Kerin Ransom in the office 04/19/2019. His major complaint is of occasional tachycardia. He has rare atypical chest pain and has been on Imdur.  He had a coronary CTA performed 02/26/2019 that revealed a coronary calcium score of 0 and completely normal coronary arteries.   He did wear a loop recorder for 3 years post syncope without any arrhythmias noted  Colonoscopy by Dr. Carlean Purl 03/17/2011: -Excellent bowel prep -64mm polyp removed from the ascending colon  - POLYPOID COLORECTAL MUCOSA WITH A LYMPHOID AGGREGATE. - THERE IS NO EVIDENCE OF MALIGNANCY.  - 10 year recall  Past Medical History:  Diagnosis Date  . Aneurysm ([place])   . Asthma   . Blood transfusion   . CAD (coronary artery disease), non obstructive on cath 2011 04/05/2012  . Coronary artery disease   . Diabetes mellitus   . Diabetes mellitus without complication ([place])   . Enlarged prostate   . GERD (gastroesophageal reflux disease)   . H/O syncope   . Heart disease   . History of repair of patent ductus arteriosus 07/23/2016  . Hyperlipidemia   . Hypertension   . 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 Refusal of blood transfusions as patient is Jehovah's Witness    Past Surgical History:  Procedure Laterality Date  . APPENDECTOMY     . BACK SURGERY    . CARDIAC CATHETERIZATION  2007   Clean Cardiac Cath ([place]), with RCA 30% narrowing likely due to catheter induced spasm in 09/02/10.  Marland Kitchen CARDIAC CATHETERIZATION  09/02/2010   mod. nonobstructive disease in the RCA and CX, tortuous LAD  . COLONOSCOPY W/ POLYPECTOMY  03/17/11   diminutive polyp  . LOOP RECORDER EXPLANT N/A 01/14/2014   Procedure: LOOP RECORDER EXPLANT;  Surgeon: Sanda Klein, MD;  Location: [place] CATH LAB;  Service: Cardiovascular;  Laterality: N/A;  . LOOP RECORDER IMPLANT  04/06/2012   Reveal XT [id number]  . LOOP RECORDER IMPLANT N/A 04/06/2012   Procedure: LOOP RECORDER IMPLANT;  Surgeon: Sanda Klein, MD;  Location: [place] CATH LAB;  Service: Cardiovascular;  Laterality: N/A;  . NM MYOCAR PERF WALL MOTION  01/25/2008   mild anteroapical wall ischemia  . PATENT DUCTUS ARTERIOUS REPAIR     at age 8    Social History:  Family History:    reports that he has never smoked. He has never used smokeless tobacco. He reports that he does not drink alcohol or use drugs. family history includes Allergies in his daughter; Aneurysm in his father; Breast cancer in his maternal grandmother; Colon cancer in his paternal grandfather and paternal uncle; Colon polyps in his mother; Coronary artery disease in his father and mother; Heart attack (age of onset: 83) in his father; Heart disease in his mother; Liver disease in an other family member; Migraines in his  mother; Prostate cancer in his paternal grandfather; Stomach cancer in his brother. Allergies  Allergen Reactions  . Benadryl [Diphenhydramine Hcl]     "drives me nuts"      Outpatient Encounter Medications as of 02/10/2020  Medication Sig  . albuterol (PROVENTIL) (2.5 MG/3ML) 0.083% nebulizer solution USE 1 VIAL VIA NEBULIZER  EVERY 6 HOURS AS NEEDED FOR WHEEZING OR SHORTNESS OF  BREATH (Patient taking differently: Take 2.5 mg by nebulization every 6 (six) hours as needed for wheezing. )  . Ascorbic Acid  (VITAMIN C) 1000 MG tablet Take 1,000 mg by mouth daily.  Marland Kitchen aspirin EC 81 MG EC tablet Take 1 tablet (81 mg total) by mouth daily.  Marland Kitchen atorvastatin (LIPITOR) 80 MG tablet TAKE 1 TABLET BY MOUTH ONCE DAILY (Patient taking differently: Take 80 mg by mouth daily at 6 PM. )  . betamethasone dipropionate (DIPROLENE) 0.05 % cream Apply topically 2 (two) times daily.  . cetirizine (ZYRTEC) 10 MG tablet Take 10 mg by mouth daily.  . clonazePAM (KLONOPIN) 1 MG tablet Take 1 tablet (1 mg total) by mouth daily.  . diclofenac sodium (VOLTAREN) 1 % GEL Apply 2 g topically 4 (four) times daily as needed (pain).  . DULoxetine (CYMBALTA) 30 MG capsule TAKE 1 CAPSULE BY MOUTH  DAILY  . ezetimibe (ZETIA) 10 MG tablet TAKE 1 TABLET BY MOUTH  DAILY  . fluticasone (FLONASE) 50 MCG/ACT nasal spray Place 2 sprays into both nostrils daily.  Marland Kitchen gabapentin (NEURONTIN) 300 MG capsule TAKE 1 CAPSULE BY MOUTH AT  6AM, 11:30AM AND 4:30PM AND TAKE 3 CAPSULES AT 10:00PM (Patient taking differently: Take 300-900 mg by mouth See admin instructions. Take 1 capsule at 6 am, 11:30 am and 4:30 pm then take 3 capsules at 10 pm)  . Icosapent Ethyl (VASCEPA) 1 g CAPS Take 2 capsules (2 g total) by mouth 2 (two) times daily.  . isosorbide mononitrate (IMDUR) 30 MG 24 hr tablet TAKE 1/2 TABLET BY MOUTH DAILY  . ketoconazole (NIZORAL) 2 % shampoo APPLY TOPICALLY DAILY AS  DIRECTED  . meclizine (ANTIVERT) 25 MG tablet Take 1 tablet (25 mg total) by mouth 3 (three) times daily as needed for dizziness.  . meloxicam (MOBIC) 7.5 MG tablet TAKE 1 TABLET BY MOUTH  DAILY AS NEEDED FOR PAIN (Patient taking differently: Take 7.5 mg by mouth daily as needed for pain. )  . metFORMIN (GLUCOPHAGE-XR) 500 MG 24 hr tablet TAKE 2 TABLETS BY MOUTH  DAILY WITH BREAKFAST (Patient taking differently: Take 500 mg by mouth 2 (two) times daily. )  . metoprolol tartrate (LOPRESSOR) 50 MG tablet TAKE 1 TABLET BY MOUTH  TWICE DAILY  . montelukast (SINGULAIR) 10 MG  tablet TAKE 1 TABLET BY MOUTH AT  BEDTIME (Patient taking differently: Take 10 mg by mouth at bedtime. )  . Multiple Vitamin (MULTIVITAMIN WITH MINERALS) TABS tablet Take 1 tablet by mouth daily.  . nitroGLYCERIN (NITROSTAT) 0.4 MG SL tablet Place 1 tablet (0.4 mg total) under the tongue every 5 (five) minutes as needed for chest pain.  Marland Kitchen ondansetron (ZOFRAN) 4 MG tablet Take 1 tablet (4 mg total) by mouth every 8 (eight) hours as needed for nausea or vomiting.  Glory Rosebush VERIO test strip USE WITH METER TO CHECK  BLOOD SUGAR 3 TIMES DAILY  . pantoprazole (PROTONIX) 40 MG tablet Take 1 tablet (40 mg total) by mouth daily.  . silver sulfADIAZINE (SILVADENE) 1 % cream Apply 1 application topically daily.  . tamsulosin (FLOMAX) 0.4 MG  CAPS capsule TAKE 1 CAPSULE BY MOUTH  DAILY  . tiZANidine (ZANAFLEX) 2 MG tablet TAKE 1 TABLET BY MOUTH  EVERY 6 HOURS AS NEEDED FOR MUSCLE SPASM(S) (Patient taking differently: Take 2 mg by mouth every 6 (six) hours as needed for muscle spasms. )  . traMADol (ULTRAM) 50 MG tablet Take 1 tablet (50 mg total) by mouth 2 (two) times daily.  . traZODone (DESYREL) 100 MG tablet TAKE 1 TABLET BY MOUTH AT  BEDTIME (Patient taking differently: Take 100 mg by mouth at bedtime. )  . zolpidem (AMBIEN) 10 MG tablet TAKE 1 TABLET BY MOUTH AT  BEDTIME AS NEEDED FOR SLEEP (Patient taking differently: Take 10 mg by mouth at bedtime as needed for sleep. )   No facility-administered encounter medications on file as of 02/10/2020.     REVIEW OF SYSTEMS: All other systems reviewed and negative except where noted in the History of Present Illness.   PHYSICAL EXAM: There were no vitals taken for this visit. General: Well developed ... in no acute distress. Head: Normocephalic and atraumatic. Eyes:  Sclerae non-icteric, conjunctive pink. Ears: Normal auditory acuity. Mouth: Dentition intact. No ulcers or lesions.  Neck: Supple, no lymphadenopathy or thyromegaly.  Lungs: Clear  bilaterally to auscultation without wheezes, crackles or rhonchi. Heart: Regular rate and rhythm. No murmur, rub or gallop appreciated.  Abdomen: Soft, nontender, non distended. No masses. No hepatosplenomegaly. Normoactive bowel sounds x 4 quadrants.  Rectal:  Musculoskeletal: Symmetrical with no gross deformities. Skin: Warm and dry. No rash or lesions on visible extremities. Extremities: No edema. Neurological: Alert oriented x 4, no focal deficits.  Psychological:  Alert and cooperative. Normal mood and affect.  ASSESSMENT AND PLAN:  57. 49 year old male with chest pain   CC:  [name], NP

## 2020-02-10 ENCOUNTER — Ambulatory Visit: Payer: Medicare Other | Admitting: Nurse Practitioner

## 2020-02-10 ENCOUNTER — Telehealth: Payer: Self-pay | Admitting: *Deleted

## 2020-02-10 NOTE — Telephone Encounter (Signed)
Follow up  Cody Raymond from Dr. Arsenio Katz office called and provide fax# [phone]

## 2020-02-10 NOTE — Telephone Encounter (Signed)
   [facility] Pre-operative Risk Assessment    HEARTCARE STAFF: - Please ensure there is not already an duplicate clearance open for this procedure. - Under Visit Info/Reason for Call, type in Other and utilize the format Clearance MM/DD/YY or Clearance TBD. Do not use dashes or single digits. - If request is for dental extraction, please clarify the # of teeth to be extracted.  Request for surgical clearance:  1. What type of surgery is being performed? Several extractions,crowns and bridge work   2. When is this surgery scheduled? TBD  3. What type of clearance is required (medical clearance vs. Pharmacy clearance to hold med vs. Both)? medical  4. Are there any medications that need to be held prior to surgery and how long? none  5. Practice name and name of physician performing surgery? Wendall Papa DDS  6. What is the office phone number? [phone]   7.   What is the office fax number? LEFT MESSAGE asking them to call with number- NOT LISTED ON CLEARANCE  8.   Anesthesia type (None, local, MAC, general) ? Local  They are also asking if the patient will need antibiotics   Fredia Beets 02/10/2020, 3:27 PM  _________________________________________________________________   (provider comments below)

## 2020-02-11 NOTE — Telephone Encounter (Signed)
Waiting on Zio monitor result.

## 2020-02-27 ENCOUNTER — Encounter: Payer: Self-pay | Admitting: Nurse Practitioner

## 2020-02-27 ENCOUNTER — Other Ambulatory Visit: Payer: Self-pay

## 2020-02-27 ENCOUNTER — Ambulatory Visit (INDEPENDENT_AMBULATORY_CARE_PROVIDER_SITE_OTHER): Payer: Medicare Other | Admitting: Nurse Practitioner

## 2020-02-27 VITALS — BP 126/84 | HR 72 | Ht 68.0 in | Wt 192.4 lb

## 2020-02-27 DIAGNOSIS — I25119 Atherosclerotic heart disease of native coronary artery with unspecified angina pectoris: Secondary | ICD-10-CM | POA: Diagnosis not present

## 2020-02-27 DIAGNOSIS — Z86010 Personal history of colonic polyps: Secondary | ICD-10-CM | POA: Diagnosis not present

## 2020-02-27 DIAGNOSIS — Z800 Family history of malignant neoplasm of digestive organs: Secondary | ICD-10-CM | POA: Diagnosis not present

## 2020-02-27 DIAGNOSIS — R079 Chest pain, unspecified: Secondary | ICD-10-CM

## 2020-02-27 DIAGNOSIS — K219 Gastro-esophageal reflux disease without esophagitis: Secondary | ICD-10-CM

## 2020-02-27 NOTE — Patient Instructions (Addendum)
If you are age 49 or older, your body mass index should be between 23-30. Your Body mass index is 29.25 kg/m. If this is out of the aforementioned range listed, please consider follow up with your Primary Care Provider.  If you are age 26 or younger, your body mass index should be between 19-25. Your Body mass index is 29.25 kg/m. If this is out of the aformentioned range listed, please consider follow up with your Primary Care Provider.   Cardiac clearance required prior to scheduling  a colonoscopy/endoscopy with Dr. Carlean Purl.  Please continue taking Pantoprazole 40mg  daily.   Due to recent changes in healthcare laws, you may see the results of your imaging and laboratory studies on MyChart before your provider has had a chance to review them.  We understand that in some cases there may be results that are confusing or concerning to you. Not all laboratory results come back in the same time frame and the provider may be waiting for multiple results in order to interpret others.  Please give [place] 48 hours in order for your provider to thoroughly review all the results before contacting the office for clarification of your results.   Thank you for choosing [facility] Noralyn Pick, CRNP

## 2020-02-27 NOTE — Progress Notes (Addendum)
02/27/2020 Cody Raymond [id number] August 22, 1971   CHIEF COMPLAINT: Heartburn, chest pain   HISTORY OF PRESENT ILLNESS:  History of asthma, anxiety, depression, fibromyalgia, hypertension, thoracic aortic aneurysm, nonobstructive coronary artery disease, s/p repair of patent ductus arteriosus at the age of 49,  DM II and GERD.  Past appendectomy. He presents today for further evaluation regarding heartburn and chest pain.  He is a Jehovah witness, he declines any blood products.  His cardiologist Dr. Quay Burow recommended further GI evaluation regarding his chronic left chest pain.  He describes having a sharp dull mid left chest pain which sometimes shoots down both arms with numbness down his left arm.  Eating does not trigger the symptoms.  His left chest pain can occur daily for a few weeks at a time then resolve for a few weeks at a time.  He is having episodes of heart racing, reports his heart rate will run in the 120's then he develops chest pain. He went to [facility] ER on 5/17 due to having CP and heart racing. Labs were done but he waited in the ER for 5 to 6 hours without been evaluated by a physician so walked out. The last episode of heart racing with left chest pain occurred while watching tv on Thurs 6/3. He took NTG 1 tab Q 20 minutes x 3 and his left chest pain resolved a few hours later.  He recently underwent a 2 week Zio patch  Monitor, results pending. He has nausea twice weekly for the past 5 years which he attributes to his numerous medications.  No vomiting.  No history of gallstones. He reports having heartburn all of the time, 3 to 4 days weekly for several years.  He previously took over-the-counter antiacid's, and apple cider vinegar with brief relief then were no longer effective.  He was prescribed Pantoprazole 40 mg once daily approximately 1 month ago by his PCP and his heartburn has significantly improved.  He complains of a lump sensation in his throat which he  attributes to having postnasal drainage, phlegm in his throat.  History of seasonal allergies.  No change in his left chest pain since starting the pantoprazole.  He drinks 2-3  eight ounce cups of caffeinated coffee daily.  He is passing normal brown bowel movement twice daily.  If he takes Pepto-Bismol for indigestion his stools turn black. He takes ASA 81mg  daily. No other NSAID use.  He underwent a colonoscopy by Dr. Carlean Purl 03/17/2011, a 50mm polyp was removed from the ascending colon, biopsies showed benign colonic mucosa with lymphoid aggregate. Paternal grandfather and paternal uncle with history of colon cancer. Mother with history of colon polyps. No other complaints today.  CBC Latest Ref Rng & Units 02/03/2020 12/28/2019 08/21/2019  WBC 4.0 - 10.5 K/uL 6.7 13.1(H) 6.8  Hemoglobin 13.0 - 17.0 g/dL 13.6 15.1 13.9  Hematocrit 39 - 52 % 41.8 46.3 40.9  Platelets 150 - 400 K/uL 249 286 266.0   CMP Latest Ref Rng & Units 02/03/2020 12/28/2019 08/21/2019  Glucose 70 - 99 mg/dL 122(H) 128(H) 91  BUN 6 - 20 mg/dL 10 18 16   Creatinine 0.61 - 1.24 mg/dL 0.99 0.95 0.69  Sodium 135 - 145 mmol/L 136 139 138  Potassium 3.5 - 5.1 mmol/L 4.1 4.0 4.1  Chloride 98 - 111 mmol/L 101 104 105  CO2 22 - 32 mmol/L 23 23 24   Calcium 8.9 - 10.3 mg/dL 8.8(L) 9.2 9.2  Total Protein 6.5 - 8.1 g/dL -  7.4 6.8  Total Bilirubin 0.3 - 1.2 mg/dL - 0.9 0.5  Alkaline Phos 38 - 126 U/L - 84 75  AST 15 - 41 U/L - 20 13  ALT 0 - 44 U/L - 21 13      Chest/abdominal CTA 12/28/2019: 1. No evidence of thoracic aortic dissection. 2. Unchanged prominence of the distal arch/proximal descending thoracic aorta at the level of the ductus arteriosus measuring 3.1 cm in diameter. Remainder of the aorta is normal in caliber. 3. No acute findings in the chest, abdomen, or pelvis.  Past Medical History:  Diagnosis Date  . Aneurysm ([place])   . Asthma   . Blood transfusion   . CAD (coronary artery disease), non obstructive on cath  2011 04/05/2012  . Coronary artery disease   . Diabetes mellitus   . Diabetes mellitus without complication ([place])   . Enlarged prostate   . GERD (gastroesophageal reflux disease)   . H/O syncope   . Heart disease   . History of repair of patent ductus arteriosus 07/23/2016  . Hyperlipidemia   . Hypertension   . 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 Refusal of blood transfusions as patient is Jehovah's Witness    Past Surgical History:  Procedure Laterality Date  . APPENDECTOMY    . BACK SURGERY    . CARDIAC CATHETERIZATION  2007   Clean Cardiac Cath ([place]), with RCA 30% narrowing likely due to catheter induced spasm in 09/02/10.  Marland Kitchen CARDIAC CATHETERIZATION  09/02/2010   mod. nonobstructive disease in the RCA and CX, tortuous LAD  . COLONOSCOPY W/ POLYPECTOMY  03/17/11   diminutive polyp  . LOOP RECORDER EXPLANT N/A 01/14/2014   Procedure: LOOP RECORDER EXPLANT;  Surgeon: Sanda Klein, MD;  Location: [place] CATH LAB;  Service: Cardiovascular;  Laterality: N/A;  . LOOP RECORDER IMPLANT  04/06/2012   Reveal XT 4801  . LOOP RECORDER IMPLANT N/A 04/06/2012   Procedure: LOOP RECORDER IMPLANT;  Surgeon: Sanda Klein, MD;  Location: [place] CATH LAB;  Service: Cardiovascular;  Laterality: N/A;  . NM MYOCAR PERF WALL MOTION  01/25/2008   mild anteroapical wall ischemia  . PATENT DUCTUS ARTERIOUS REPAIR     at age 5    Social History:  Married. He has 2 daughters. 2 grand children.  Nonsmoker. Alcohol once in a blue moon. No drug use.   Family History: Mother heart disease, colon polyps. Father MI, partial paralyzed age age 52.  Paternal uncle with history of prostate cancer. Paternal grandfather diagnosed age 48 and paternal uncle 66's had colon cancer. Brother died from stomach cancer as an infant.   Allergies  Allergen Reactions  . Benadryl [Diphenhydramine Hcl]     "drives me nuts"      Outpatient Encounter Medications as of 02/27/2020  Medication Sig  . albuterol  (PROVENTIL) (2.5 MG/3ML) 0.083% nebulizer solution USE 1 VIAL VIA NEBULIZER  EVERY 6 HOURS AS NEEDED FOR WHEEZING OR SHORTNESS OF  BREATH (Patient taking differently: Take 2.5 mg by nebulization every 6 (six) hours as needed for wheezing. )  . Ascorbic Acid (VITAMIN C) 1000 MG tablet Take 1,000 mg by mouth daily.  Marland Kitchen aspirin EC 81 MG EC tablet Take 1 tablet (81 mg total) by mouth daily.  Marland Kitchen atorvastatin (LIPITOR) 80 MG tablet TAKE 1 TABLET BY MOUTH ONCE DAILY (Patient taking differently: Take 80 mg by mouth daily at 6 PM. )  . betamethasone dipropionate (DIPROLENE) 0.05 % cream Apply topically 2 (two) times daily.  Marland Kitchen  cetirizine (ZYRTEC) 10 MG tablet Take 10 mg by mouth daily.  . clonazePAM (KLONOPIN) 1 MG tablet Take 1 tablet (1 mg total) by mouth daily.  . diclofenac sodium (VOLTAREN) 1 % GEL Apply 2 g topically 4 (four) times daily as needed (pain).  . DULoxetine (CYMBALTA) 30 MG capsule TAKE 1 CAPSULE BY MOUTH  DAILY  . ezetimibe (ZETIA) 10 MG tablet TAKE 1 TABLET BY MOUTH  DAILY  . fluticasone (FLONASE) 50 MCG/ACT nasal spray Place 2 sprays into both nostrils daily.  Marland Kitchen gabapentin (NEURONTIN) 300 MG capsule TAKE 1 CAPSULE BY MOUTH AT  6AM, 11:30AM AND 4:30PM AND TAKE 3 CAPSULES AT 10:00PM (Patient taking differently: Take 300-900 mg by mouth See admin instructions. Take 1 capsule at 6 am, 11:30 am and 4:30 pm then take 3 capsules at 10 pm)  . Icosapent Ethyl (VASCEPA) 1 g CAPS Take 2 capsules (2 g total) by mouth 2 (two) times daily.  . isosorbide mononitrate (IMDUR) 30 MG 24 hr tablet TAKE 1/2 TABLET BY MOUTH DAILY  . ketoconazole (NIZORAL) 2 % shampoo APPLY TOPICALLY DAILY AS  DIRECTED  . meclizine (ANTIVERT) 25 MG tablet Take 1 tablet (25 mg total) by mouth 3 (three) times daily as needed for dizziness.  . meloxicam (MOBIC) 7.5 MG tablet TAKE 1 TABLET BY MOUTH  DAILY AS NEEDED FOR PAIN (Patient taking differently: Take 7.5 mg by mouth daily as needed for pain. )  . metFORMIN (GLUCOPHAGE-XR) 500  MG 24 hr tablet TAKE 2 TABLETS BY MOUTH  DAILY WITH BREAKFAST (Patient taking differently: Take 500 mg by mouth 2 (two) times daily. )  . metoprolol tartrate (LOPRESSOR) 50 MG tablet TAKE 1 TABLET BY MOUTH  TWICE DAILY  . montelukast (SINGULAIR) 10 MG tablet TAKE 1 TABLET BY MOUTH AT  BEDTIME (Patient taking differently: Take 10 mg by mouth at bedtime. )  . Multiple Vitamin (MULTIVITAMIN WITH MINERALS) TABS tablet Take 1 tablet by mouth daily.  . nitroGLYCERIN (NITROSTAT) 0.4 MG SL tablet Place 1 tablet (0.4 mg total) under the tongue every 5 (five) minutes as needed for chest pain.  Marland Kitchen ondansetron (ZOFRAN) 4 MG tablet Take 1 tablet (4 mg total) by mouth every 8 (eight) hours as needed for nausea or vomiting.  Glory Rosebush VERIO test strip USE WITH METER TO CHECK  BLOOD SUGAR 3 TIMES DAILY  . pantoprazole (PROTONIX) 40 MG tablet Take 1 tablet (40 mg total) by mouth daily.  . silver sulfADIAZINE (SILVADENE) 1 % cream Apply 1 application topically daily.  . tamsulosin (FLOMAX) 0.4 MG CAPS capsule TAKE 1 CAPSULE BY MOUTH  DAILY  . tiZANidine (ZANAFLEX) 2 MG tablet TAKE 1 TABLET BY MOUTH  EVERY 6 HOURS AS NEEDED FOR MUSCLE SPASM(S) (Patient taking differently: Take 2 mg by mouth every 6 (six) hours as needed for muscle spasms. )  . traMADol (ULTRAM) 50 MG tablet Take 1 tablet (50 mg total) by mouth 2 (two) times daily.  . traZODone (DESYREL) 100 MG tablet TAKE 1 TABLET BY MOUTH AT  BEDTIME (Patient taking differently: Take 100 mg by mouth at bedtime. )  . zolpidem (AMBIEN) 10 MG tablet TAKE 1 TABLET BY MOUTH AT  BEDTIME AS NEEDED FOR SLEEP (Patient taking differently: Take 10 mg by mouth at bedtime as needed for sleep. )   No facility-administered encounter medications on file as of 02/27/2020.     REVIEW OF SYSTEMS: All other systems reviewed and negative except where noted in the History of Present Illness.  PHYSICAL  EXAM: BP 126/84   Pulse 72   Ht 5\' 8"  (1.727 m)   Wt 192 lb 6 oz (87.3 kg)   BMI  29.25 kg/m  General: Well developed  49 year old male in no acute distress. Head: Normocephalic and atraumatic. Eyes:  Sclerae non-icteric, conjunctive pink. Ears: Normal auditory acuity. Mouth: Few missing teeth.  No ulcers or lesions.  Neck: Supple, no lymphadenopathy or thyromegaly.  Lungs: Clear bilaterally to auscultation without wheezes, crackles or rhonchi. Heart: Regular rate and rhythm. No murmur, rub or gallop appreciated.  Abdomen: Soft, nontender, non distended. No masses. No hepatosplenomegaly. Normoactive bowel sounds x 4 quadrants.  Rectal: Deferred.  Musculoskeletal: Symmetrical with no gross deformities. Skin: Warm and dry. No rash or lesions on visible extremities. Extremities: No edema. Neurological: Alert oriented x 4, no focal deficits.  Psychological:  Alert and cooperative. Normal mood and affect.  ASSESSMENT AND PLAN:  55.  49 year old male with nonobstructive coronary artery disease presents with chronic intermittent left chest pain of unclear etiology. Episodic tachycardia, zio patch study results pending. His cardiologist, Dr. Gwenlyn Found does not assess his chest pain is ischemically mediated. GI evaluation recommended.  -Schedule EGD once cardiac clearance received. EGD benefits and risks discussed including risk with sedation, risk of bleeding, perforation and infection  -Continue Pantoprazole 40 mg once daily for now -Discussed GERD diet  -Patient to call our office if his symptoms worsen -If EGD negative, I will order an abdominal sonogram to assess for gallstones   2.  GERD, chronic -See plan in #1  3. Colon cancer screening.Family history of colon cancer and colon polyps.  -Schedule colonoscopy at time of EGD, await cardiac clearance prior to scheduling. Colonoscopy benefits and risks discussed including risk with sedation, risk of bleeding, perforation and infection    CC:  [name], NP    Based upon my review I think he is ok to sedate and  scope - from what I saw in Dr. Kennon Holter note of 5/18.  However, the patient has an appointment w/ Dr. Gwenlyn Found 6/22 so we will see if he has anything to add.  I probably would not do an ultrasound based upon what I see here.  Gatha Mayer, MD, [facility]

## 2020-03-03 ENCOUNTER — Ambulatory Visit: Payer: Medicare Other | Admitting: Cardiovascular Disease

## 2020-03-06 NOTE — Telephone Encounter (Signed)
Tried to reach the pt to confirm if he is still wearing Zio monitor, vm is full could not leave message.

## 2020-03-06 NOTE — Telephone Encounter (Signed)
Pt has appt with Dr. Gwenlyn Found 03/10/20.

## 2020-03-06 NOTE — Telephone Encounter (Signed)
It appears this clearance is waiting on zio results, monitor ordered on 5/18. Can you please see if the patient is still wearing it or if it has resulted yet? I do not see in on my end.

## 2020-03-06 NOTE — Telephone Encounter (Signed)
I will forward to Dr. Gwenlyn Found as pt has appt 03/10/20. Will add pre op clearance notes to appt notes. I will remove from the pre op call back pool.

## 2020-03-10 ENCOUNTER — Encounter: Payer: Self-pay | Admitting: Cardiovascular Disease

## 2020-03-10 ENCOUNTER — Ambulatory Visit: Payer: Medicare Other | Admitting: Cardiovascular Disease

## 2020-03-10 ENCOUNTER — Other Ambulatory Visit: Payer: Self-pay

## 2020-03-10 VITALS — BP 114/76 | HR 93 | Ht 68.0 in | Wt 195.6 lb

## 2020-03-10 DIAGNOSIS — G4733 Obstructive sleep apnea (adult) (pediatric): Secondary | ICD-10-CM | POA: Diagnosis not present

## 2020-03-10 DIAGNOSIS — I712 Thoracic aortic aneurysm, without rupture, unspecified: Secondary | ICD-10-CM

## 2020-03-10 DIAGNOSIS — R0789 Other chest pain: Secondary | ICD-10-CM | POA: Diagnosis not present

## 2020-03-10 DIAGNOSIS — E785 Hyperlipidemia, unspecified: Secondary | ICD-10-CM

## 2020-03-10 DIAGNOSIS — R002 Palpitations: Secondary | ICD-10-CM | POA: Diagnosis not present

## 2020-03-10 LAB — TSH: TSH: 2.34 u[IU]/mL (ref 0.450–4.500)

## 2020-03-10 LAB — T4, FREE: Free T4: 1.22 ng/dL (ref 0.82–1.77)

## 2020-03-10 NOTE — Assessment & Plan Note (Addendum)
History of atypical chest pain in the past with normal cath back in 2007 and 2011 respectively, and normal CTA 02/26/2019

## 2020-03-10 NOTE — Progress Notes (Signed)
03/10/2020 Cody Raymond   1970/12/03  [id number]  Primary Physician Cody Alar, NP Primary Cardiologist: Cody Raymond FACP, [place], [place], [place]  HPI:  Cody Raymond is a 49 y.o.  mild to moderately overweight married Latino male father of 2 daughters, grandfather 2 grandchildrenwho I last saw in the office  02/04/2020.He is referred to be established in our practice for ongoing cardiovascular care because of chest pain. He was previously a patient of Dr. Sallyanne Raymond , then Dr. Einar Raymond and now is transferring to me because of insurance issues. His cardiac risk factors are notable for treated hypertension, diabetes and hyperlipidemia. He is a Restaurant manager, fast food. He is never smoked. His mother did have CABG. He is never had a heart attack or stroke. Does get fairly constant daily chest pain. He had a heart cath in 2011 that showed nonobstructive CAD in the circumflex and RCA and negative Myoview in 2016. His mother did have HOCM and had a septal myectomy. He has LVH without asymmetric thickening. He has been out of work for over 6 years and has been disabled. He has had syncope in the past with a negative work-up including loop implantation and explantation. He was recently seen in the emergency room with chest pain 10/06/2018 with negative work-up.  He was hospitalized for 1 day 12/14/2018 with atypical chest pain and ruled out for myocardial infarction. He sees saw Cody Raymond in the office 04/19/2019. His major complaint is of occasional tachycardia. He has rare atypical chest pain and has been on Imdur.  He had a coronary CTA performed 02/26/2019 that revealed a coronary calcium score of 0 and completely normal coronary arteries.  He has not had any ER visits since I saw him a month ago.  He does have episodes of tachycardia which leave him somewhat fatigued.  I did order 2-week Zio patch however he only wore it for a week.  He admits to drinking 3 cups of coffee a  day.  He also has symptoms compatible with obstructive sleep apnea with nocturnal snoring and daytime somnolence.   Current Meds  Medication Sig  . albuterol (PROVENTIL) (2.5 MG/3ML) 0.083% nebulizer solution USE 1 VIAL VIA NEBULIZER  EVERY 6 HOURS AS NEEDED FOR WHEEZING OR SHORTNESS OF  BREATH (Patient taking differently: Take 2.5 mg by nebulization every 6 (six) hours as needed for wheezing. )  . Ascorbic Acid (VITAMIN C) 1000 MG tablet Take 1,000 mg by mouth daily.  Marland Kitchen aspirin EC 81 MG EC tablet Take 1 tablet (81 mg total) by mouth daily.  Marland Kitchen atorvastatin (LIPITOR) 80 MG tablet TAKE 1 TABLET BY MOUTH ONCE DAILY (Patient taking differently: Take 80 mg by mouth daily at 6 PM. )  . betamethasone dipropionate (DIPROLENE) 0.05 % cream Apply topically 2 (two) times daily.  . cetirizine (ZYRTEC) 10 MG tablet Take 10 mg by mouth daily.  . clonazePAM (KLONOPIN) 1 MG tablet Take 1 tablet (1 mg total) by mouth daily.  . diclofenac sodium (VOLTAREN) 1 % GEL Apply 2 g topically 4 (four) times daily as needed (pain).  . DULoxetine (CYMBALTA) 30 MG capsule TAKE 1 CAPSULE BY MOUTH  DAILY  . ezetimibe (ZETIA) 10 MG tablet TAKE 1 TABLET BY MOUTH  DAILY  . fluticasone (FLONASE) 50 MCG/ACT nasal spray Place 2 sprays into both nostrils daily.  Marland Kitchen gabapentin (NEURONTIN) 300 MG capsule TAKE 1 CAPSULE BY MOUTH AT  6AM, 11:30AM AND 4:30PM AND TAKE 3 CAPSULES AT 10:00PM (Patient taking differently: Take  300-900 mg by mouth See admin instructions. Take 1 capsule at 6 am, 11:30 am and 4:30 pm then take 3 capsules at 10 pm)  . Icosapent Ethyl (VASCEPA) 1 g CAPS Take 2 capsules (2 g total) by mouth 2 (two) times daily.  . isosorbide mononitrate (IMDUR) 30 MG 24 hr tablet TAKE 1/2 TABLET BY MOUTH DAILY  . ketoconazole (NIZORAL) 2 % shampoo APPLY TOPICALLY DAILY AS  DIRECTED  . meclizine (ANTIVERT) 25 MG tablet Take 1 tablet (25 mg total) by mouth 3 (three) times daily as needed for dizziness.  . meloxicam (MOBIC) 7.5 MG  tablet TAKE 1 TABLET BY MOUTH  DAILY AS NEEDED FOR PAIN (Patient taking differently: Take 7.5 mg by mouth daily as needed for pain. )  . metFORMIN (GLUCOPHAGE-XR) 500 MG 24 hr tablet TAKE 2 TABLETS BY MOUTH  DAILY WITH BREAKFAST (Patient taking differently: Take 500 mg by mouth 2 (two) times daily. )  . metoprolol tartrate (LOPRESSOR) 50 MG tablet TAKE 1 TABLET BY MOUTH  TWICE DAILY  . montelukast (SINGULAIR) 10 MG tablet TAKE 1 TABLET BY MOUTH AT  BEDTIME (Patient taking differently: Take 10 mg by mouth at bedtime. )  . Multiple Vitamin (MULTIVITAMIN WITH MINERALS) TABS tablet Take 1 tablet by mouth daily.  . nitroGLYCERIN (NITROSTAT) 0.4 MG SL tablet Place 1 tablet (0.4 mg total) under the tongue every 5 (five) minutes as needed for chest pain.  Marland Kitchen ondansetron (ZOFRAN) 4 MG tablet Take 1 tablet (4 mg total) by mouth every 8 (eight) hours as needed for nausea or vomiting.  Glory Rosebush VERIO test strip USE WITH METER TO CHECK  BLOOD SUGAR 3 TIMES DAILY  . pantoprazole (PROTONIX) 40 MG tablet Take 1 tablet (40 mg total) by mouth daily.  . silver sulfADIAZINE (SILVADENE) 1 % cream Apply 1 application topically daily.  . tamsulosin (FLOMAX) 0.4 MG CAPS capsule TAKE 1 CAPSULE BY MOUTH  DAILY  . tiZANidine (ZANAFLEX) 2 MG tablet TAKE 1 TABLET BY MOUTH  EVERY 6 HOURS AS NEEDED FOR MUSCLE SPASM(S) (Patient taking differently: Take 2 mg by mouth every 6 (six) hours as needed for muscle spasms. )  . traMADol (ULTRAM) 50 MG tablet Take 1 tablet (50 mg total) by mouth 2 (two) times daily.  . traZODone (DESYREL) 100 MG tablet TAKE 1 TABLET BY MOUTH AT  BEDTIME (Patient taking differently: Take 100 mg by mouth at bedtime. )  . zolpidem (AMBIEN) 10 MG tablet TAKE 1 TABLET BY MOUTH AT  BEDTIME AS NEEDED FOR SLEEP (Patient taking differently: Take 10 mg by mouth at bedtime as needed for sleep. )     Allergies  Allergen Reactions  . Benadryl [Diphenhydramine Hcl]     "drives me nuts"    Social History    Socioeconomic History  . Marital status: Married    Spouse name: Not on file  . Number of children: Not on file  . Years of education: Not on file  . Highest education level: Not on file  Occupational History  . Not on file  Tobacco Use  . Smoking status: Never Smoker  . Smokeless tobacco: Never Used  Vaping Use  . Vaping Use: Never used  Substance and Sexual Activity  . Alcohol use: No  . Drug use: No  . Sexual activity: Yes  Other Topics Concern  . Not on file  Social History Narrative   ** Merged History Encounter **       Holter monitor 08/2010: PVCs and sinus tachy.  Sleep Study (02/2008): mild sleep apnea, no indication for CPAP.   Social Determinants of Health   Financial Resource Strain:   . Difficulty of Paying Living Expenses:   Food Insecurity:   . Worried About Charity fundraiser in the Last Year:   . Arboriculturist in the Last Year:   Transportation Needs:   . Film/video editor (Medical):   Marland Kitchen Lack of Transportation (Non-Medical):   Physical Activity:   . Days of Exercise per Week:   . Minutes of Exercise per Session:   Stress:   . Feeling of Stress :   Social Connections:   . Frequency of Communication with Friends and Family:   . Frequency of Social Gatherings with Friends and Family:   . Attends Religious Services:   . Active Member of Clubs or Organizations:   . Attends Archivist Meetings:   Marland Kitchen Marital Status:   Intimate Partner Violence:   . Fear of Current or Ex-Partner:   . Emotionally Abused:   Marland Kitchen Physically Abused:   . Sexually Abused:      Review of Systems: General: negative for chills, fever, night sweats or weight changes.  Cardiovascular: negative for chest pain, dyspnea on exertion, edema, orthopnea, palpitations, paroxysmal nocturnal dyspnea or shortness of breath Dermatological: negative for rash Respiratory: negative for cough or wheezing Urologic: negative for hematuria Abdominal: negative for nausea,  vomiting, diarrhea, bright red blood per rectum, melena, or hematemesis Neurologic: negative for visual changes, syncope, or dizziness All other systems reviewed and are otherwise negative except as noted above.    Blood pressure 114/76, pulse 93, height 5\' 8"  (1.727 m), weight 195 lb 9.6 oz (88.7 kg), SpO2 97 %.  General appearance: alert and no distress Neck: no adenopathy, no carotid bruit, no JVD, supple, symmetrical, trachea midline and thyroid not enlarged, symmetric, no tenderness/mass/nodules Lungs: clear to auscultation bilaterally Heart: regular rate and rhythm, S1, S2 normal, no murmur, click, rub or gallop Extremities: extremities normal, atraumatic, no cyanosis or edema Pulses: 2+ and symmetric Skin: Skin color, texture, turgor normal. No rashes or lesions Neurologic: Alert and oriented X 3, normal strength and tone. Normal symmetric reflexes. Normal coordination and gait  EKG not performed today  ASSESSMENT AND PLAN:   Palpitations History of symptomatic palpitations.  He wore a 2-week Zio patch which he only actually wore for a week because it kept falling off.  Results are unavailable to me currently.  He does drink 3 cups of coffee in the morning.  He had a loop recorder in for 3 years because of syncope evaluation that was unrevealing.  He gets periodic episodes of tachycardia that leaves him feeling weak.  He did provide me with some strips from his [place] home monitoring device which were unrevealing.  He is on metoprolol 50 mg p.o. twice daily and has normal LV function.  I am going to get thyroid function studies this morning, order a sleep study since he does have symptoms compatible with obstructive sleep apnea.  I will see him back in 3 months for follow-up.  I am not going to alter his medications currently.  Chest pain History of atypical chest pain in the past with normal cath back in 2007 and 2011 respectively, and normal CTA 02/26/2019  Dyslipidemia History of  dyslipidemia followed by Dr. Debara Pickett in the lipid clinic on atorvastatin and Vascepa.  Thoracic aortic aneurysm ([place]) History of's a small ascending thoracic aortic aneurysm measuring 3.1 cm which is  unchanged since 2019 recently checked 12/28/2019.  This does not need to be imaged imaged in the next several years.      Cody Raymond FACP,FACC,FAHA, [facility] 03/10/2020 9:14 AM

## 2020-03-10 NOTE — Assessment & Plan Note (Signed)
History of dyslipidemia followed by Dr. Debara Pickett in the lipid clinic on atorvastatin and Vascepa.

## 2020-03-10 NOTE — Assessment & Plan Note (Signed)
History of's a small ascending thoracic aortic aneurysm measuring 3.1 cm which is unchanged since 2019 recently checked 12/28/2019.  This does not need to be imaged imaged in the next several years.

## 2020-03-10 NOTE — Patient Instructions (Addendum)
Medication Instructions:  Your physician recommends that you continue on your current medications as directed. Please refer to the Current Medication list given to you today.  *If you need a refill on your cardiac medications before your next appointment, please call your pharmacy*   Lab Work: TSH, freeT4 today  If you have labs (blood work) drawn today and your tests are completely normal, you will receive your results only by: Marland Kitchen MyChart Message (if you have MyChart) OR . A paper copy in the mail If you have any lab test that is abnormal or we need to change your treatment, we will call you to review the results.   Testing/Procedures: Your physician has recommended that you have a sleep study. This test records several body functions during sleep, including: brain activity, eye movement, oxygen and carbon dioxide blood levels, heart rate and rhythm, breathing rate and rhythm, the flow of air through your mouth and nose, snoring, body muscle movements, and chest and belly movement.  Follow-Up: At [facility], you and your health needs are our priority.  As part of our continuing mission to provide you with exceptional heart care, we have created designated Provider Care Teams.  These Care Teams include your primary Cardiologist (physician) and Advanced Practice Providers (APPs -  Physician Assistants and Nurse Practitioners) who all work together to provide you with the care you need, when you need it.  We recommend signing up for the patient portal called "MyChart".  Sign up information is provided on this After Visit Summary.  MyChart is used to connect with patients for Virtual Visits (Telemedicine).  Patients are able to view lab/test results, encounter notes, upcoming appointments, etc.  Non-urgent messages can be sent to your provider as well.   To learn more about what you can do with MyChart, go to [email or web address].    Your next appointment:   2 month(s)  The format for  your next appointment:   In Person  Provider:   Dr. Gwenlyn Found

## 2020-03-10 NOTE — Assessment & Plan Note (Signed)
History of symptomatic palpitations.  He wore a 2-week Zio patch which he only actually wore for a week because it kept falling off.  Results are unavailable to me currently.  He does drink 3 cups of coffee in the morning.  He had a loop recorder in for 3 years because of syncope evaluation that was unrevealing.  He gets periodic episodes of tachycardia that leaves him feeling weak.  He did provide me with some strips from his [employer] home monitoring device which were unrevealing.  He is on metoprolol 50 mg p.o. twice daily and has normal LV function.  I am going to get thyroid function studies this morning, order a sleep study since he does have symptoms compatible with obstructive sleep apnea.  I will see him back in 3 months for follow-up.  I am not going to alter his medications currently.

## 2020-03-11 ENCOUNTER — Telehealth: Payer: Self-pay | Admitting: *Deleted

## 2020-03-11 DIAGNOSIS — R002 Palpitations: Secondary | ICD-10-CM | POA: Diagnosis not present

## 2020-03-11 NOTE — Telephone Encounter (Signed)
Split night sleep study scheduled with Terrie Pennix. Per [facility] web portal no PA is required. Decision ES:[id number]. Patient will get instruction information and forms from sleep lab. Patient will be notified via MyChart of sleep study and COVID appointment times.

## 2020-03-15 ENCOUNTER — Other Ambulatory Visit: Payer: Self-pay | Admitting: Family

## 2020-03-15 DIAGNOSIS — E785 Hyperlipidemia, unspecified: Secondary | ICD-10-CM

## 2020-03-23 ENCOUNTER — Encounter: Payer: Self-pay | Admitting: Family

## 2020-03-31 ENCOUNTER — Other Ambulatory Visit (HOSPITAL_COMMUNITY): Payer: Medicare Other

## 2020-03-31 ENCOUNTER — Telehealth: Payer: Self-pay | Admitting: General Surgery

## 2020-03-31 NOTE — Telephone Encounter (Signed)
Left a message for the patient to call to schedule an ECL/lec with Dr Carlean Purl.

## 2020-03-31 NOTE — Telephone Encounter (Signed)
Left a message on the patients voicemail to call so I may schedule an ECL/LEC with Dr Carlean Purl.

## 2020-04-01 ENCOUNTER — Telehealth: Payer: Self-pay | Admitting: *Deleted

## 2020-04-01 ENCOUNTER — Other Ambulatory Visit: Payer: Self-pay | Admitting: Cardiovascular Disease

## 2020-04-01 ENCOUNTER — Ambulatory Visit: Payer: Medicare Other | Admitting: Family

## 2020-04-01 DIAGNOSIS — R400 Somnolence: Secondary | ICD-10-CM

## 2020-04-01 DIAGNOSIS — R0683 Snoring: Secondary | ICD-10-CM

## 2020-04-01 NOTE — Telephone Encounter (Signed)
Left message sleep study has been changed to home sleep study per his request. Appointment information left. Also in [place].

## 2020-04-01 NOTE — Telephone Encounter (Signed)
Left a message for the 3rd time for the patient to schedule his ECL with DR Carlean Purl. I will send the patient a letter since he has not responded to 3 voice messages.   Sent a letter via [employer] and by mail.

## 2020-04-02 ENCOUNTER — Encounter (HOSPITAL_BASED_OUTPATIENT_CLINIC_OR_DEPARTMENT_OTHER): Payer: Medicare Other | Admitting: Cardiovascular Disease

## 2020-04-08 DIAGNOSIS — R072 Precordial pain: Secondary | ICD-10-CM | POA: Diagnosis not present

## 2020-04-08 DIAGNOSIS — R000 Tachycardia, unspecified: Secondary | ICD-10-CM | POA: Diagnosis not present

## 2020-04-08 DIAGNOSIS — I10 Essential (primary) hypertension: Secondary | ICD-10-CM | POA: Diagnosis not present

## 2020-04-08 DIAGNOSIS — Z794 Long term (current) use of insulin: Secondary | ICD-10-CM | POA: Diagnosis not present

## 2020-04-08 DIAGNOSIS — J45909 Unspecified asthma, uncomplicated: Secondary | ICD-10-CM | POA: Diagnosis not present

## 2020-04-08 DIAGNOSIS — R109 Unspecified abdominal pain: Secondary | ICD-10-CM | POA: Diagnosis not present

## 2020-04-08 DIAGNOSIS — R9431 Abnormal electrocardiogram [ECG] [EKG]: Secondary | ICD-10-CM | POA: Diagnosis not present

## 2020-04-08 DIAGNOSIS — I4891 Unspecified atrial fibrillation: Secondary | ICD-10-CM | POA: Diagnosis not present

## 2020-04-08 DIAGNOSIS — I200 Unstable angina: Secondary | ICD-10-CM | POA: Diagnosis not present

## 2020-04-08 DIAGNOSIS — E1140 Type 2 diabetes mellitus with diabetic neuropathy, unspecified: Secondary | ICD-10-CM | POA: Diagnosis not present

## 2020-04-08 DIAGNOSIS — R0789 Other chest pain: Secondary | ICD-10-CM | POA: Diagnosis not present

## 2020-04-08 DIAGNOSIS — R0602 Shortness of breath: Secondary | ICD-10-CM | POA: Diagnosis not present

## 2020-04-08 DIAGNOSIS — Z7982 Long term (current) use of aspirin: Secondary | ICD-10-CM | POA: Diagnosis not present

## 2020-04-08 DIAGNOSIS — I25110 Atherosclerotic heart disease of native coronary artery with unstable angina pectoris: Secondary | ICD-10-CM | POA: Diagnosis not present

## 2020-04-08 DIAGNOSIS — R079 Chest pain, unspecified: Secondary | ICD-10-CM | POA: Diagnosis not present

## 2020-04-08 DIAGNOSIS — Z8774 Personal history of (corrected) congenital malformations of heart and circulatory system: Secondary | ICD-10-CM | POA: Diagnosis not present

## 2020-04-08 DIAGNOSIS — Z20822 Contact with and (suspected) exposure to covid-19: Secondary | ICD-10-CM | POA: Diagnosis not present

## 2020-04-08 DIAGNOSIS — R030 Elevated blood-pressure reading, without diagnosis of hypertension: Secondary | ICD-10-CM | POA: Diagnosis not present

## 2020-04-08 DIAGNOSIS — E7800 Pure hypercholesterolemia, unspecified: Secondary | ICD-10-CM | POA: Diagnosis not present

## 2020-04-09 DIAGNOSIS — R072 Precordial pain: Secondary | ICD-10-CM | POA: Diagnosis not present

## 2020-04-09 DIAGNOSIS — R0602 Shortness of breath: Secondary | ICD-10-CM | POA: Diagnosis not present

## 2020-04-09 DIAGNOSIS — R9431 Abnormal electrocardiogram [ECG] [EKG]: Secondary | ICD-10-CM | POA: Diagnosis not present

## 2020-04-09 DIAGNOSIS — R000 Tachycardia, unspecified: Secondary | ICD-10-CM | POA: Diagnosis not present

## 2020-04-09 DIAGNOSIS — R079 Chest pain, unspecified: Secondary | ICD-10-CM | POA: Diagnosis not present

## 2020-04-10 DIAGNOSIS — R079 Chest pain, unspecified: Secondary | ICD-10-CM | POA: Diagnosis not present

## 2020-04-10 DIAGNOSIS — R000 Tachycardia, unspecified: Secondary | ICD-10-CM | POA: Diagnosis not present

## 2020-04-15 ENCOUNTER — Ambulatory Visit (INDEPENDENT_AMBULATORY_CARE_PROVIDER_SITE_OTHER): Payer: Medicare Other | Admitting: Family

## 2020-04-15 ENCOUNTER — Other Ambulatory Visit: Payer: Self-pay

## 2020-04-15 VITALS — BP 102/68 | HR 72 | Temp 98.0°F | Resp 16 | Ht 68.0 in | Wt 187.0 lb

## 2020-04-15 DIAGNOSIS — R000 Tachycardia, unspecified: Secondary | ICD-10-CM | POA: Diagnosis not present

## 2020-04-15 DIAGNOSIS — F419 Anxiety disorder, unspecified: Secondary | ICD-10-CM

## 2020-04-15 MED ORDER — DULOXETINE HCL 30 MG PO CPEP
ORAL_CAPSULE | ORAL | 0 refills | Status: DC
Start: 2020-04-15 — End: 2020-05-27

## 2020-04-15 NOTE — Patient Instructions (Signed)
Please begin cymbalta.

## 2020-04-15 NOTE — Progress Notes (Signed)
Subjective:    Patient ID: Cody Raymond, male    DOB: 1971/02/12, 49 y.o.   MRN: [id number]  HPI  Patient is a 49 yr old male who presents today following   Reports that he had episode of tachycardia hr 144. Reports that he was told that he was mildly anemic.    Reports that he ws told that it was anxiety.  Reports tha he stopped zoloft and clonazapam prior to this episode of tachycardia.   He never started cymbalta due to cost.    He reports that he has restarted clonazepam an dit "has leveled me out.  He never started cymbalta. He has had no further   Metoprolol was increased from 50mg  bid to 75mg  bid.   Having anxiety with his mom, issues with his oldest daughter, learned that his youngest daughter has some heart problems.   [place].   Feels anxious because nothing is going on.     Neg stress test Review of Systems See HPI  Past Medical History:  Diagnosis Date  . Aneurysm ([place])   . Asthma   . Blood transfusion   . CAD (coronary artery disease), non obstructive on cath 2011 04/05/2012  . Coronary artery disease   . Diabetes mellitus   . Diabetes mellitus without complication ([place])   . Enlarged prostate   . GERD (gastroesophageal reflux disease)   . H/O syncope   . Heart disease   . History of repair of patent ductus arteriosus 07/23/2016  . Hyperlipidemia   . Hypertension   . 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 Refusal of blood transfusions as patient is Jehovah's Witness      Social History   Socioeconomic History  . Marital status: Married    Spouse name: Not on file  . Number of children: Not on file  . Years of education: Not on file  . Highest education level: Not on file  Occupational History  . Not on file  Tobacco Use  . Smoking status: Never Smoker  . Smokeless tobacco: Never Used  Vaping Use  . Vaping Use: Never used  Substance and Sexual Activity  . Alcohol use: No  .  Drug use: No  . Sexual activity: Yes  Other Topics Concern  . Not on file  Social History Narrative   ** Merged History Encounter **       Holter monitor 08/2010: PVCs and sinus tachy.   Sleep Study (02/2008): mild sleep apnea, no indication for CPAP.   Social Determinants of Health   Financial Resource Strain:   . Difficulty of Paying Living Expenses:   Food Insecurity:   . Worried About Charity fundraiser in the Last Year:   . Arboriculturist in the Last Year:   Transportation Needs:   . Film/video editor (Medical):   Marland Kitchen Lack of Transportation (Non-Medical):   Physical Activity:   . Days of Exercise per Week:   . Minutes of Exercise per Session:   Stress:   . Feeling of Stress :   Social Connections:   . Frequency of Communication with Friends and Family:   . Frequency of Social Gatherings with Friends and Family:   . Attends Religious Services:   . Active Member of Clubs or Organizations:   . Attends Archivist Meetings:   Marland Kitchen Marital Status:   Intimate Partner Violence:   .  Fear of Current or Ex-Partner:   . Emotionally Abused:   Marland Kitchen Physically Abused:   . Sexually Abused:     Past Surgical History:  Procedure Laterality Date  . APPENDECTOMY    . BACK SURGERY    . CARDIAC CATHETERIZATION  2007   Clean Cardiac Cath ([place]), with RCA 30% narrowing likely due to catheter induced spasm in 09/02/10.  Marland Kitchen CARDIAC CATHETERIZATION  09/02/2010   mod. nonobstructive disease in the RCA and CX, tortuous LAD  . COLONOSCOPY W/ POLYPECTOMY  03/17/11   diminutive polyp  . LOOP RECORDER EXPLANT N/A 01/14/2014   Procedure: LOOP RECORDER EXPLANT;  Surgeon: Sanda Klein, MD;  Location: [place] CATH [facility];  Service: Cardiovascular;  Laterality: N/A;  . LOOP RECORDER IMPLANT  04/06/2012   Reveal XT 3570  . LOOP RECORDER IMPLANT N/A 04/06/2012   Procedure: LOOP RECORDER IMPLANT;  Surgeon: Sanda Klein, MD;  Location: [place] CATH [facility];  Service: Cardiovascular;  Laterality:  N/A;  . NM MYOCAR PERF WALL MOTION  01/25/2008   mild anteroapical wall ischemia  . PATENT DUCTUS ARTERIOUS REPAIR     at age 70    Family History  Problem Relation Age of Onset  . Colon cancer Paternal Grandfather   . Prostate cancer Paternal Grandfather   . Aneurysm Father   . Heart attack Father 26  . Coronary artery disease Father   . Colon polyps Mother   . Heart disease Mother   . Coronary artery disease Mother   . Migraines Mother   . Breast cancer Maternal Grandmother   . Colon cancer Paternal Uncle        x 2  . Stomach cancer Brother   . Liver disease Other        unsure who it was  . Allergies Daughter     Allergies  Allergen Reactions  . Benadryl [Diphenhydramine Hcl]     "drives me nuts"    Current Outpatient Medications on File Prior to Visit  Medication Sig Dispense Refill  . albuterol (PROVENTIL) (2.5 MG/3ML) 0.083% nebulizer solution USE 1 VIAL VIA NEBULIZER  EVERY 6 HOURS AS NEEDED FOR WHEEZING OR SHORTNESS OF  BREATH (Patient taking differently: Take 2.5 mg by nebulization every 6 (six) hours as needed for wheezing. ) 1125 mL 0  . Ascorbic Acid (VITAMIN C) 1000 MG tablet Take 1,000 mg by mouth daily.    Marland Kitchen aspirin EC 81 MG EC tablet Take 1 tablet (81 mg total) by mouth daily. 30 tablet 0  . atorvastatin (LIPITOR) 80 MG tablet TAKE 1 TABLET BY MOUTH ONCE DAILY 90 tablet 1  . betamethasone dipropionate (DIPROLENE) 0.05 % cream Apply topically 2 (two) times daily. 30 g 0  . cetirizine (ZYRTEC) 10 MG tablet Take 10 mg by mouth daily.    . clonazePAM (KLONOPIN) 1 MG tablet Take 1 tablet (1 mg total) by mouth daily. 90 tablet 0  . diclofenac sodium (VOLTAREN) 1 % GEL Apply 2 g topically 4 (four) times daily as needed (pain). 720 g 0  . DULoxetine (CYMBALTA) 30 MG capsule TAKE 1 CAPSULE BY MOUTH  DAILY 90 capsule 3  . ezetimibe (ZETIA) 10 MG tablet TAKE 1 TABLET BY MOUTH  DAILY 90 tablet 3  . fluticasone (FLONASE) 50 MCG/ACT nasal spray Place 2 sprays into both  nostrils daily. 9.9 g 0  . gabapentin (NEURONTIN) 300 MG capsule TAKE 1 CAPSULE BY MOUTH AT  6 AM, 11:30 AM AND 4:30 PM  AND TAKE 3 CAPSULES BY  MOUTH AT 10:00 PM 540 capsule 1  . Icosapent Ethyl (VASCEPA) 1 g CAPS Take 2 capsules (2 g total) by mouth 2 (two) times daily. 120 capsule 0  . isosorbide mononitrate (IMDUR) 30 MG 24 hr tablet TAKE 1/2 TABLET BY MOUTH DAILY 45 tablet 1  . ketoconazole (NIZORAL) 2 % shampoo APPLY TOPICALLY DAILY AS  DIRECTED 360 mL 1  . meclizine (ANTIVERT) 25 MG tablet Take 1 tablet (25 mg total) by mouth 3 (three) times daily as needed for dizziness. 30 tablet 0  . meloxicam (MOBIC) 7.5 MG tablet TAKE 1 TABLET BY MOUTH  DAILY AS NEEDED FOR PAIN (Patient taking differently: Take 7.5 mg by mouth daily as needed for pain. ) 90 tablet 1  . metFORMIN (GLUCOPHAGE-XR) 500 MG 24 hr tablet TAKE 2 TABLETS BY MOUTH  DAILY WITH BREAKFAST 180 tablet 1  . metoprolol tartrate (LOPRESSOR) 50 MG tablet TAKE 1 TABLET BY MOUTH  TWICE DAILY 180 tablet 2  . montelukast (SINGULAIR) 10 MG tablet TAKE 1 TABLET BY MOUTH AT  BEDTIME 90 tablet 1  . Multiple Vitamin (MULTIVITAMIN WITH MINERALS) TABS tablet Take 1 tablet by mouth daily.    . nitroGLYCERIN (NITROSTAT) 0.4 MG SL tablet Place 1 tablet (0.4 mg total) under the tongue every 5 (five) minutes as needed for chest pain. 25 tablet PRN  . ondansetron (ZOFRAN) 4 MG tablet Take 1 tablet (4 mg total) by mouth every 8 (eight) hours as needed for nausea or vomiting. 12 tablet 0  . ONETOUCH VERIO test strip USE WITH METER TO CHECK  BLOOD SUGAR 3 TIMES DAILY 300 strip 3  . pantoprazole (PROTONIX) 40 MG tablet Take 1 tablet (40 mg total) by mouth daily. 90 tablet 1  . silver sulfADIAZINE (SILVADENE) 1 % cream Apply 1 application topically daily. 50 g 0  . tamsulosin (FLOMAX) 0.4 MG CAPS capsule TAKE 1 CAPSULE BY MOUTH  DAILY 90 capsule 3  . tiZANidine (ZANAFLEX) 2 MG tablet TAKE 1 TABLET BY MOUTH  EVERY 6 HOURS AS NEEDED FOR MUSCLE SPASM(S) (Patient  taking differently: Take 2 mg by mouth every 6 (six) hours as needed for muscle spasms. ) 270 tablet 0  . traMADol (ULTRAM) 50 MG tablet Take 1 tablet (50 mg total) by mouth 2 (two) times daily. 60 tablet 5  . traZODone (DESYREL) 100 MG tablet TAKE 1 TABLET BY MOUTH AT  BEDTIME (Patient taking differently: Take 100 mg by mouth at bedtime. ) 90 tablet 1  . zolpidem (AMBIEN) 10 MG tablet TAKE 1 TABLET BY MOUTH AT  BEDTIME AS NEEDED FOR SLEEP (Patient taking differently: Take 10 mg by mouth at bedtime as needed for sleep. ) 90 tablet 0   No current facility-administered medications on file prior to visit.    BP 102/68 (BP Location: Left Arm, Patient Position: Sitting, Cuff Size: Small)   Pulse 72   Temp 98 F (36.7 C) (Oral)   Resp 16   Ht 5\' 8"  (1.727 m)   Wt 187 lb (84.8 kg)   SpO2 100%   BMI 28.43 kg/m       Objective:   Physical Exam Constitutional:      General: He is not in acute distress.    Appearance: He is well-developed.  HENT:     Head: Normocephalic and atraumatic.  Cardiovascular:     Rate and Rhythm: Normal rate and regular rhythm.     Heart sounds: No murmur heard.   Pulmonary:     Effort: Pulmonary  effort is normal. No respiratory distress.     Breath sounds: Normal breath sounds. No wheezing or rales.  Skin:    General: Skin is warm and dry.  Neurological:     Mental Status: He is alert and oriented to person, place, and time.  Psychiatric:        Behavior: Behavior normal.        Thought Content: Thought content normal.           Assessment & Plan:  Tachycardia- current heart rate stable. We have requested records from the ER.  I did review some of the information that he had from his patient portal on his phone. I agree that his symptoms were likely related to his anxiety and discontinuation of his medications.    Anxiety- uncontrolled. Will initiate cymbalta (he will use good rx to help with cost).  Continue klonopin once daily as needed.   This  visit occurred during the SARS-CoV-2 public health emergency.  Safety protocols were in place, including screening questions prior to the visit, additional usage of staff PPE, and extensive cleaning of exam room while observing appropriate contact time as indicated for disinfecting solutions.

## 2020-04-19 ENCOUNTER — Encounter: Payer: Self-pay | Admitting: Family

## 2020-04-24 ENCOUNTER — Other Ambulatory Visit: Payer: Self-pay | Admitting: Family

## 2020-04-28 ENCOUNTER — Ambulatory Visit: Payer: Medicare Other | Admitting: Cardiovascular Disease

## 2020-05-12 ENCOUNTER — Encounter: Payer: Self-pay | Admitting: Family

## 2020-05-13 ENCOUNTER — Ambulatory Visit: Payer: Medicare Other | Admitting: Family

## 2020-05-14 NOTE — Telephone Encounter (Signed)
Patient scheduled for follow up 05-18-20

## 2020-05-18 ENCOUNTER — Ambulatory Visit (INDEPENDENT_AMBULATORY_CARE_PROVIDER_SITE_OTHER): Payer: Medicare Other | Admitting: Family

## 2020-05-18 ENCOUNTER — Encounter: Payer: Self-pay | Admitting: Family

## 2020-05-18 ENCOUNTER — Other Ambulatory Visit: Payer: Self-pay

## 2020-05-18 VITALS — BP 108/81 | HR 71 | Temp 98.4°F | Resp 16 | Ht 68.0 in | Wt 183.0 lb

## 2020-05-18 DIAGNOSIS — Z1159 Encounter for screening for other viral diseases: Secondary | ICD-10-CM

## 2020-05-18 DIAGNOSIS — G4700 Insomnia, unspecified: Secondary | ICD-10-CM

## 2020-05-18 DIAGNOSIS — K219 Gastro-esophageal reflux disease without esophagitis: Secondary | ICD-10-CM

## 2020-05-18 DIAGNOSIS — E785 Hyperlipidemia, unspecified: Secondary | ICD-10-CM

## 2020-05-18 DIAGNOSIS — E1142 Type 2 diabetes mellitus with diabetic polyneuropathy: Secondary | ICD-10-CM | POA: Diagnosis not present

## 2020-05-18 DIAGNOSIS — J4520 Mild intermittent asthma, uncomplicated: Secondary | ICD-10-CM

## 2020-05-18 DIAGNOSIS — F418 Other specified anxiety disorders: Secondary | ICD-10-CM

## 2020-05-18 DIAGNOSIS — N400 Enlarged prostate without lower urinary tract symptoms: Secondary | ICD-10-CM

## 2020-05-18 MED ORDER — LISINOPRIL 10 MG PO TABS: 10.0000 mg | ORAL_TABLET | Freq: Every day | ORAL | 1 refills | Status: DC

## 2020-05-18 MED ORDER — TRAZODONE HCL 100 MG PO TABS: 100.0000 mg | ORAL_TABLET | Freq: Every day | ORAL | 1 refills | Status: DC

## 2020-05-18 NOTE — Progress Notes (Signed)
Subjective:    Patient ID: Cody Raymond, male    DOB: 11-05-1970, 49 y.o.   MRN: [id number]  HPI   Patient is a 49 yr old male who presents today for follow up.  DM2- He reports that his sugars have been good until he he had a stressful day yesterday- went up to 200 last night. He is maintained on metformin.   Lab Results  Component Value Date   HGBA1C 5.8 08/21/2019   HGBA1C 6.0 (H) 12/15/2018   HGBA1C 5.9 07/31/2018   Lab Results  Component Value Date   MICROALBUR 1.15 11/20/2013   LDLCALC 77 08/21/2019   CREATININE 0.99 02/03/2020   GERD- maintained on PPI.  Reports symptoms are stable.    HTN- maintained on metoprolol, imdur,   Insomnia- uses trazodone/clonazepam at night.    Depression/anxiety- reports that cymbalta has been helpful.   BPH- reports stable on flomax.    Hyperlipidemia- maintained on lipitor 80mg .  Lab Results  Component Value Date   CHOL 154 08/21/2019   HDL 38.20 (L) 08/21/2019   LDLCALC 77 08/21/2019   LDLDIRECT 36 04/19/2019   TRIG 192.0 (H) 08/21/2019   CHOLHDL 4 08/21/2019    Insomnia- no longer using ambien. Uses trazodone. This is helpful for him but he ran out a few weeks ago.  Notes that he and his wife are having problems and this has been stressful for him.   Review of Systems Past Medical History:  Diagnosis Date  . Aneurysm ([place])   . Asthma   . Blood transfusion   . CAD (coronary artery disease), non obstructive on cath 2011 04/05/2012  . Coronary artery disease   . Diabetes mellitus   . Diabetes mellitus without complication ([place])   . Enlarged prostate   . GERD (gastroesophageal reflux disease)   . H/O syncope   . Heart disease   . History of repair of patent ductus arteriosus 07/23/2016  . Hyperlipidemia   . Hypertension   . 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 Refusal of blood transfusions as patient is Jehovah's Witness      Social History   Socioeconomic History  . Marital status: Married    Spouse  name: Not on file  . Number of children: Not on file  . Years of education: Not on file  . Highest education level: Not on file  Occupational History  . Not on file  Tobacco Use  . Smoking status: Never Smoker  . Smokeless tobacco: Never Used  Vaping Use  . Vaping Use: Never used  Substance and Sexual Activity  . Alcohol use: No  . Drug use: No  . Sexual activity: Yes  Other Topics Concern  . Not on file  Social History Narrative   ** Merged History Encounter **       Holter monitor 08/2010: PVCs and sinus tachy.   Sleep Study (02/2008): mild sleep apnea, no indication for CPAP.   Social Determinants of Health   Financial Resource Strain:   . Difficulty of Paying Living Expenses: Not on file  Food Insecurity:   . Worried About Charity fundraiser in the Last Year: Not on file  . Ran Out of Food in the Last Year: Not on file  Transportation Needs:   . Lack of Transportation (Medical): Not on file  . Lack of Transportation (Non-Medical): Not on file  Physical Activity:   . Days of Exercise per Week: Not on file  . Minutes of  Exercise per Session: Not on file  Stress:   . Feeling of Stress : Not on file  Social Connections:   . Frequency of Communication with Friends and Family: Not on file  . Frequency of Social Gatherings with Friends and Family: Not on file  . Attends Religious Services: Not on file  . Active Member of Clubs or Organizations: Not on file  . Attends Archivist Meetings: Not on file  . Marital Status: Not on file  Intimate Partner Violence:   . Fear of Current or Ex-Partner: Not on file  . Emotionally Abused: Not on file  . Physically Abused: Not on file  . Sexually Abused: Not on file    Past Surgical History:  Procedure Laterality Date  . APPENDECTOMY    . BACK SURGERY    . CARDIAC CATHETERIZATION  2007   Clean Cardiac Cath ([place]), with RCA 30% narrowing likely due to catheter induced spasm in 09/02/10.  Marland Kitchen CARDIAC  CATHETERIZATION  09/02/2010   mod. nonobstructive disease in the RCA and CX, tortuous LAD  . COLONOSCOPY W/ POLYPECTOMY  03/17/11   diminutive polyp  . LOOP RECORDER EXPLANT N/A 01/14/2014   Procedure: LOOP RECORDER EXPLANT;  Surgeon: Sanda Klein, MD;  Location: [facility]ATH [facility];  Service: Cardiovascular;  Laterality: N/A;  . LOOP RECORDER IMPLANT  04/06/2012   Reveal XT [id number]  . LOOP RECORDER IMPLANT N/A 04/06/2012   Procedure: LOOP RECORDER IMPLANT;  Surgeon: Sanda Klein, MD;  Location: [facility];  Service: Cardiovascular;  Laterality: N/A;  . NM MYOCAR PERF WALL MOTION  01/25/2008   mild anteroapical wall ischemia  . PATENT DUCTUS ARTERIOUS REPAIR     at age 49    Family History  Problem Relation Age of Onset  . Colon cancer Paternal Grandfather   . Prostate cancer Paternal Grandfather   . Aneurysm Father   . Heart attack Father 38  . Coronary artery disease Father   . Colon polyps Mother   . Heart disease Mother   . Coronary artery disease Mother   . Migraines Mother   . Breast cancer Maternal Grandmother   . Colon cancer Paternal Uncle        x 2  . Stomach cancer Brother   . Liver disease Other        unsure who it was  . Allergies Daughter     Allergies  Allergen Reactions  . Benadryl [Diphenhydramine Hcl]     "drives me nuts"    Current Outpatient Medications on File Prior to Visit  Medication Sig Dispense Refill  . albuterol (PROVENTIL) (2.5 MG/3ML) 0.083% nebulizer solution USE 1 VIAL VIA NEBULIZER  EVERY 6 HOURS AS NEEDED FOR WHEEZING OR SHORTNESS OF  BREATH (Patient taking differently: Take 2.5 mg by nebulization every 6 (six) hours as needed for wheezing. ) 1125 mL 0  . Ascorbic Acid (VITAMIN C) 1000 MG tablet Take 1,000 mg by mouth daily.    Marland Kitchen aspirin EC 81 MG EC tablet Take 1 tablet (81 mg total) by mouth daily. 30 tablet 0  . atorvastatin (LIPITOR) 80 MG tablet TAKE 1 TABLET BY MOUTH ONCE DAILY 90 tablet 1  . betamethasone dipropionate (DIPROLENE) 0.05 %  cream Apply topically 2 (two) times daily. 30 g 0  . cetirizine (ZYRTEC) 10 MG tablet Take 10 mg by mouth daily.    . clonazePAM (KLONOPIN) 1 MG tablet Take 1 tablet (1 mg total) by mouth daily. 90 tablet 0  . diclofenac  sodium (VOLTAREN) 1 % GEL Apply 2 g topically 4 (four) times daily as needed (pain). 720 g 0  . DULoxetine (CYMBALTA) 30 MG capsule Take 1 cap by mouth once daily for 1 week, then increase to caps by mouth once daily. 60 capsule 0  . ezetimibe (ZETIA) 10 MG tablet TAKE 1 TABLET BY MOUTH  DAILY 90 tablet 3  . fluticasone (FLONASE) 50 MCG/ACT nasal spray Place 2 sprays into both nostrils daily. 9.9 g 0  . gabapentin (NEURONTIN) 300 MG capsule TAKE 1 CAPSULE BY MOUTH AT  6 AM, 11:30 AM AND 4:30 PM  AND TAKE 3 CAPSULES BY  MOUTH AT 10:00 PM 540 capsule 1  . Icosapent Ethyl (VASCEPA) 1 g CAPS Take 2 capsules (2 g total) by mouth 2 (two) times daily. 120 capsule 0  . isosorbide mononitrate (IMDUR) 30 MG 24 hr tablet TAKE 1/2 TABLET BY MOUTH DAILY 45 tablet 1  . ketoconazole (NIZORAL) 2 % shampoo APPLY TOPICALLY DAILY AS  DIRECTED 360 mL 1  . meloxicam (MOBIC) 7.5 MG tablet TAKE 1 TABLET BY MOUTH  DAILY AS NEEDED FOR PAIN (Patient taking differently: Take 7.5 mg by mouth daily as needed for pain. ) 90 tablet 1  . metFORMIN (GLUCOPHAGE-XR) 500 MG 24 hr tablet TAKE 2 TABLETS BY MOUTH  DAILY WITH BREAKFAST 180 tablet 1  . metoprolol tartrate (LOPRESSOR) 50 MG tablet TAKE 1 TABLET BY MOUTH  TWICE DAILY 180 tablet 2  . montelukast (SINGULAIR) 10 MG tablet TAKE 1 TABLET BY MOUTH AT  BEDTIME 90 tablet 1  . Multiple Vitamin (MULTIVITAMIN WITH MINERALS) TABS tablet Take 1 tablet by mouth daily.    . nitroGLYCERIN (NITROSTAT) 0.4 MG SL tablet Place 1 tablet (0.4 mg total) under the tongue every 5 (five) minutes as needed for chest pain. 25 tablet PRN  . ondansetron (ZOFRAN) 4 MG tablet Take 1 tablet (4 mg total) by mouth every 8 (eight) hours as needed for nausea or vomiting. 12 tablet 0  .  ONETOUCH VERIO test strip USE WITH METER TO CHECK  BLOOD SUGAR 3 TIMES DAILY 300 strip 3  . pantoprazole (PROTONIX) 40 MG tablet Take 1 tablet (40 mg total) by mouth daily. 90 tablet 1  . silver sulfADIAZINE (SILVADENE) 1 % cream Apply 1 application topically daily. 50 g 0  . tamsulosin (FLOMAX) 0.4 MG CAPS capsule TAKE 1 CAPSULE BY MOUTH  DAILY 90 capsule 3  . tiZANidine (ZANAFLEX) 2 MG tablet TAKE 1 TABLET BY MOUTH  EVERY 6 HOURS AS NEEDED FOR MUSCLE SPASM(S) (Patient taking differently: Take 2 mg by mouth every 6 (six) hours as needed for muscle spasms. ) 270 tablet 0  . traMADol (ULTRAM) 50 MG tablet Take 1 tablet (50 mg total) by mouth 2 (two) times daily. 60 tablet 5   No current facility-administered medications on file prior to visit.    BP 108/81 (BP Location: Right Arm, Patient Position: Sitting, Cuff Size: Small)   Pulse 71   Temp 98.4 F (36.9 C) (Oral)   Resp 16   Ht 5\' 8"  (1.727 m)   Wt 183 lb (83 kg)   SpO2 100%   BMI 27.83 kg/m       Objective:   Physical Exam Constitutional:      General: He is not in acute distress.    Appearance: He is well-developed.  HENT:     Head: Normocephalic and atraumatic.  Cardiovascular:     Rate and Rhythm: Normal rate and regular rhythm.  Heart sounds: No murmur heard.   Pulmonary:     Effort: Pulmonary effort is normal. No respiratory distress.     Breath sounds: Normal breath sounds. No wheezing or rales.  Skin:    General: Skin is warm and dry.  Neurological:     Mental Status: He is alert and oriented to person, place, and time.  Psychiatric:        Behavior: Behavior normal.        Thought Content: Thought content normal.           Assessment & Plan:  DM2- clinically stable.  Obtain follow up A1C. He states he had covid vaccine and will send [place] a copy of his card.  Refer for DM eye exam.   GERD- stable on ppi.  Hyperlipidemia- LDL at goal. Continue atorvastatin.  Depression/anxiety- overall going well.  Just some recent situational stress with his wife.  Continue cymbalta.  Insomnia- stable with trazodone. Continue same.   BPH- maintained on flomax, no current urinary symptoms.  HTN- bp stable on current regimen. Continue same.  Asthma- reports symptoms are stable. Continues prn albuterol.   This visit occurred during the SARS-CoV-2 public health emergency.  Safety protocols were in place, including screening questions prior to the visit, additional usage of staff PPE, and extensive cleaning of exam room while observing appropriate contact time as indicated for disinfecting solutions.

## 2020-05-19 LAB — COMPREHENSIVE METABOLIC PANEL
AG Ratio: 2 (calc) (ref 1.0–2.5)
ALT: 23 U/L (ref 9–46)
AST: 26 U/L (ref 10–40)
Albumin: 4.8 g/dL (ref 3.6–5.1)
Alkaline phosphatase (APISO): 87 U/L (ref 36–130)
BUN: 14 mg/dL (ref 7–25)
CO2: 23 mmol/L (ref 20–32)
Calcium: 9.9 mg/dL (ref 8.6–10.3)
Chloride: 100 mmol/L (ref 98–110)
Creat: 0.83 mg/dL (ref 0.60–1.35)
Globulin: 2.4 g/dL (calc) (ref 1.9–3.7)
Glucose, Bld: 111 mg/dL — ABNORMAL HIGH (ref 65–99)
Potassium: 4.7 mmol/L (ref 3.5–5.3)
Sodium: 138 mmol/L (ref 135–146)
Total Bilirubin: 0.6 mg/dL (ref 0.2–1.2)
Total Protein: 7.2 g/dL (ref 6.1–8.1)

## 2020-05-19 LAB — HEMOGLOBIN A1C
Hgb A1c MFr Bld: 5.5 % of total Hgb (ref <5.7)
Mean Plasma Glucose: 111 (calc)
eAG (mmol/L): 6.2 (calc)

## 2020-05-19 LAB — HEPATITIS C ANTIBODY
Hepatitis C Ab: NONREACTIVE
SIGNAL TO CUT-OFF: 0.01 (ref <1.00)

## 2020-05-26 ENCOUNTER — Other Ambulatory Visit: Payer: Self-pay | Admitting: Family

## 2020-06-01 ENCOUNTER — Encounter: Payer: Self-pay | Admitting: Cardiovascular Disease

## 2020-06-01 ENCOUNTER — Other Ambulatory Visit: Payer: Self-pay

## 2020-06-01 ENCOUNTER — Ambulatory Visit: Payer: Medicare Other | Admitting: Cardiovascular Disease

## 2020-06-01 VITALS — BP 98/60 | HR 60 | Ht 68.0 in | Wt 183.0 lb

## 2020-06-01 DIAGNOSIS — I25119 Atherosclerotic heart disease of native coronary artery with unspecified angina pectoris: Secondary | ICD-10-CM | POA: Diagnosis not present

## 2020-06-01 DIAGNOSIS — R002 Palpitations: Secondary | ICD-10-CM | POA: Diagnosis not present

## 2020-06-01 DIAGNOSIS — E785 Hyperlipidemia, unspecified: Secondary | ICD-10-CM | POA: Diagnosis not present

## 2020-06-01 DIAGNOSIS — I10 Essential (primary) hypertension: Secondary | ICD-10-CM

## 2020-06-01 NOTE — Assessment & Plan Note (Signed)
History of hyperlipidemia on statin therapy with lipid profile performed 08/21/2019 revealing a total cholesterol 154, LDL 77 and HDL 30.

## 2020-06-01 NOTE — Assessment & Plan Note (Signed)
History of essential hypertension a blood pressure measured today at 98/60.  He is on lisinopril and metoprolol.

## 2020-06-01 NOTE — Assessment & Plan Note (Signed)
History of palpitations in the past with a loop recorder implanted that showed no significant arrhythmias.  Recent Zio patch showed only short runs of PSVT.  He did stop all stimulants including caffeine at my request and was recently placed on Cymbalta for anxiety which is markedly improved his palpitations.

## 2020-06-01 NOTE — Patient Instructions (Signed)
Medication Instructions:  °Your physician recommends that you continue on your current medications as directed. Please refer to the Current Medication list given to you today. ° °*If you need a refill on your cardiac medications before your next appointment, please call your pharmacy* ° °Lab Work: °NONE ordered at this time of appointment  ° °If you have labs (blood work) drawn today and your tests are completely normal, you will receive your results only by: °• MyChart Message (if you have MyChart) OR °• A paper copy in the mail °If you have any lab test that is abnormal or we need to change your treatment, we will call you to review the results. ° °Testing/Procedures: °NONE ordered at this time of appointment  ° °Follow-Up: °At [facility], you and your health needs are our priority.  As part of our continuing mission to provide you with exceptional heart care, we have created designated Provider Care Teams.  These Care Teams include your primary Cardiologist (physician) and Advanced Practice Providers (APPs -  Physician Assistants and Nurse Practitioners) who all work together to provide you with the care you need, when you need it. ° °We recommend signing up for the patient portal called "MyChart".  Sign up information is provided on this After Visit Summary.  MyChart is used to connect with patients for Virtual Visits (Telemedicine).  Patients are able to view lab/test results, encounter notes, upcoming appointments, etc.  Non-urgent messages can be sent to your provider as well.   °To learn more about what you can do with MyChart, go to [email or web address].   ° °Your next appointment:   °1 year(s) ° °The format for your next appointment:   °In Person ° °Provider:   °Jonathan Berry, MD ° °Other Instructions ° ° °

## 2020-06-01 NOTE — Progress Notes (Signed)
06/01/2020 Cody Raymond   [birth year]  [id number]  Primary Physician [name], NP Primary Cardiologist: Lorretta Harp MD [name], [place]  HPI:  Cody Raymond is a 49 y.o.  mild to moderately overweight married Latino male father of 2 daughters, grandfather 2 grandchildrenwho I last saw in the office  03/10/2020.He is referred to be established in our practice for ongoing cardiovascular care because of chest pain. He was previously a patient of Dr. Sallyanne Kuster , then Dr. Einar Gip and now is transferring to me because of insurance issues. His cardiac risk factors are notable for treated hypertension, diabetes and hyperlipidemia. He is a Restaurant manager, fast food. He is never smoked. His mother did have CABG. He is never had a heart attack or stroke. Does get fairly constant daily chest pain. He had a heart cath in 2011 that showed nonobstructive CAD in the circumflex and RCA and negative Myoview in 2016. His mother did have HOCM and had a septal myectomy. He has LVH without asymmetric thickening. He has been out of work forover 6 yearsand has been disabled. He has had syncope in the past with a negative work-up including loop implantation and explantation. He was recently seen in the emergency room with chest pain 10/06/2018 with negative work-up.  He was hospitalized for 1 day 12/14/2018 with atypical chest pain and ruled out for myocardial infarction. He sees saw Kerin Ransom in the office 04/19/2019. His major complaint is of occasional tachycardia. He has rare atypical chest pain and has been on Imdur.He had a coronary CTA performed 02/26/2019 that revealed a coronary calcium score of 0 and completely normal coronary arteries.     I did order 2-week Zio patch however he only wore it for a week.  This showed only rare episodes of PSVT. He admitted to drinking 3 cups of coffee a day but has since discontinued all caffeine intake. He also has symptoms compatible  with obstructive sleep apnea with nocturnal snoring and daytime somnolence.  He was seen in the ER at [facility] in [place] for tachycardia.  He had a Myoview stress test that was nonischemic and a 2D echo that was essentially normal.  He was put on Cymbalta which markedly improved his symptoms.   Current Meds  Medication Sig   albuterol (PROVENTIL) (2.5 MG/3ML) 0.083% nebulizer solution USE 1 VIAL VIA NEBULIZER  EVERY 6 HOURS AS NEEDED FOR WHEEZING OR SHORTNESS OF  BREATH (Patient taking differently: Take 2.5 mg by nebulization every 6 (six) hours as needed for wheezing. )   Ascorbic Acid (VITAMIN C) 1000 MG tablet Take 1,000 mg by mouth daily.   aspirin EC 81 MG EC tablet Take 1 tablet (81 mg total) by mouth daily.   atorvastatin (LIPITOR) 80 MG tablet TAKE 1 TABLET BY MOUTH ONCE DAILY   betamethasone dipropionate (DIPROLENE) 0.05 % cream Apply topically 2 (two) times daily.   cetirizine (ZYRTEC) 10 MG tablet Take 10 mg by mouth daily.   clonazePAM (KLONOPIN) 1 MG tablet Take 1 tablet (1 mg total) by mouth daily.   diclofenac sodium (VOLTAREN) 1 % GEL Apply 2 g topically 4 (four) times daily as needed (pain).   DULoxetine (CYMBALTA) 30 MG capsule Take 2 capsules (60 mg total) by mouth daily.   ezetimibe (ZETIA) 10 MG tablet TAKE 1 TABLET BY MOUTH  DAILY   fluticasone (FLONASE) 50 MCG/ACT nasal spray Place 2 sprays into both nostrils daily.   gabapentin (NEURONTIN) 300 MG capsule TAKE  1 CAPSULE BY MOUTH AT  6 AM, 11:30 AM AND 4:30 PM  AND TAKE 3 CAPSULES BY  MOUTH AT 10:00 PM   Icosapent Ethyl (VASCEPA) 1 g CAPS Take 2 capsules (2 g total) by mouth 2 (two) times daily.   isosorbide mononitrate (IMDUR) 30 MG 24 hr tablet TAKE 1/2 TABLET BY MOUTH DAILY   ketoconazole (NIZORAL) 2 % shampoo APPLY TOPICALLY DAILY AS  DIRECTED   lisinopril (ZESTRIL) 10 MG tablet Take 1 tablet (10 mg total) by mouth daily.   meloxicam (MOBIC) 7.5 MG tablet TAKE 1 TABLET BY  MOUTH  DAILY AS NEEDED FOR PAIN (Patient taking differently: Take 7.5 mg by mouth daily as needed for pain. )   metFORMIN (GLUCOPHAGE-XR) 500 MG 24 hr tablet TAKE 2 TABLETS BY MOUTH  DAILY WITH BREAKFAST   metoprolol tartrate (LOPRESSOR) 50 MG tablet Take 75 mg by mouth 2 (two) times daily.   montelukast (SINGULAIR) 10 MG tablet TAKE 1 TABLET BY MOUTH AT  BEDTIME   Multiple Vitamin (MULTIVITAMIN WITH MINERALS) TABS tablet Take 1 tablet by mouth daily.   nitroGLYCERIN (NITROSTAT) 0.4 MG SL tablet Place 1 tablet (0.4 mg total) under the tongue every 5 (five) minutes as needed for chest pain.   ondansetron (ZOFRAN) 4 MG tablet Take 1 tablet (4 mg total) by mouth every 8 (eight) hours as needed for nausea or vomiting.   ONETOUCH VERIO test strip USE WITH METER TO CHECK  BLOOD SUGAR 3 TIMES DAILY   pantoprazole (PROTONIX) 40 MG tablet Take 1 tablet (40 mg total) by mouth daily.   silver sulfADIAZINE (SILVADENE) 1 % cream Apply 1 application topically daily.   tamsulosin (FLOMAX) 0.4 MG CAPS capsule TAKE 1 CAPSULE BY MOUTH  DAILY   tiZANidine (ZANAFLEX) 2 MG tablet TAKE 1 TABLET BY MOUTH  EVERY 6 HOURS AS NEEDED FOR MUSCLE SPASM(S) (Patient taking differently: Take 2 mg by mouth every 6 (six) hours as needed for muscle spasms. )   traMADol (ULTRAM) 50 MG tablet Take 1 tablet (50 mg total) by mouth 2 (two) times daily.   traZODone (DESYREL) 100 MG tablet Take 1 tablet (100 mg total) by mouth at bedtime.     Allergies  Allergen Reactions   Benadryl [Diphenhydramine Hcl]     "drives me nuts"    Social History   Socioeconomic History   Marital status: Married    Spouse name: Not on file   Number of children: Not on file   Years of education: Not on file   Highest education level: Not on file  Occupational History   Not on file  Tobacco Use   Smoking status: Never Smoker   Smokeless tobacco: Never Used  Vaping Use   Vaping Use: Never used  Substance and Sexual Activity    Alcohol use: No   Drug use: No   Sexual activity: Yes  Other Topics Concern   Not on file  Social History Narrative   ** Merged History Encounter **       Holter monitor 08/2010: PVCs and sinus tachy.   Sleep Study (02/2008): mild sleep apnea, no indication for CPAP.   Social Determinants of Health   Financial Resource Strain:    Difficulty of Paying Living Expenses: Not on file  Food Insecurity:    Worried About Charity fundraiser in the Last Year: Not on file   Ran Out of Food in the Last Year: Not on file  Transportation Needs:    Lack of  Transportation (Medical): Not on file   Lack of Transportation (Non-Medical): Not on file  Physical Activity:    Days of Exercise per Week: Not on file   Minutes of Exercise per Session: Not on file  Stress:    Feeling of Stress : Not on file  Social Connections:    Frequency of Communication with Friends and Family: Not on file   Frequency of Social Gatherings with Friends and Family: Not on file   Attends Religious Services: Not on file   Active Member of Clubs or Organizations: Not on file   Attends Archivist Meetings: Not on file   Marital Status: Not on file  Intimate Partner Violence:    Fear of Current or Ex-Partner: Not on file   Emotionally Abused: Not on file   Physically Abused: Not on file   Sexually Abused: Not on file     Review of Systems: General: negative for chills, fever, night sweats or weight changes.  Cardiovascular: negative for chest pain, dyspnea on exertion, edema, orthopnea, palpitations, paroxysmal nocturnal dyspnea or shortness of breath Dermatological: negative for rash Respiratory: negative for cough or wheezing Urologic: negative for hematuria Abdominal: negative for nausea, vomiting, diarrhea, bright red blood per rectum, melena, or hematemesis Neurologic: negative for visual changes, syncope, or dizziness All other systems reviewed and are otherwise negative  except as noted above.    Blood pressure 98/60, pulse 60, height 5\' 8"  (1.727 m), weight 183 lb (83 kg).  General appearance: alert and no distress Neck: no adenopathy, no carotid bruit, no JVD, supple, symmetrical, trachea midline and thyroid not enlarged, symmetric, no tenderness/mass/nodules Lungs: clear to auscultation bilaterally Heart: regular rate and rhythm, S1, S2 normal, no murmur, click, rub or gallop Extremities: extremities normal, atraumatic, no cyanosis or edema Pulses: 2+ and symmetric Skin: Skin color, texture, turgor normal. No rashes or lesions Neurologic: Alert and oriented X 3, normal strength and tone. Normal symmetric reflexes. Normal coordination and gait  EKG sinus rhythm at 60 with incomplete right bundle branch block.  I personally reviewed this EKG.  ASSESSMENT AND PLAN:   CAD (coronary artery disease), non obstructive on cath 2011 Nonobstructive CAD by remote cath in 2011.  He had coronary CTA performed 02/26/2019 that showed a coronary calcium score 0 with no evidence of CAD.  In addition had a recent [facility] on 04/08/2020 which was nonischemic.  He said no recurrent chest pain.  Palpitations History of palpitations in the past with a loop recorder implanted that showed no significant arrhythmias.  Recent Zio patch showed only short runs of PSVT.  He did stop all stimulants including caffeine at my request and was recently placed on Cymbalta for anxiety which is markedly improved his palpitations.  Essential hypertension History of essential hypertension a blood pressure measured today at 98/60.  He is on lisinopril and metoprolol.  Dyslipidemia History of hyperlipidemia on statin therapy with lipid profile performed 08/21/2019 revealing a total cholesterol 154, LDL 77 and HDL 30.      Lorretta Harp MD FACP,FACC,FAHA, [facility] 06/01/2020 8:19 AM

## 2020-06-01 NOTE — Assessment & Plan Note (Signed)
Nonobstructive CAD by remote cath in 2011.  He had coronary CTA performed 02/26/2019 that showed a coronary calcium score 0 with no evidence of CAD.  In addition had a recent Myoview stress test at [facility] on 04/08/2020 which was nonischemic.  He said no recurrent chest pain.

## 2020-06-11 ENCOUNTER — Ambulatory Visit (HOSPITAL_BASED_OUTPATIENT_CLINIC_OR_DEPARTMENT_OTHER): Payer: Medicare Other | Attending: Cardiovascular Disease | Admitting: Cardiovascular Disease

## 2020-06-11 ENCOUNTER — Other Ambulatory Visit: Payer: Self-pay

## 2020-06-11 DIAGNOSIS — G478 Other sleep disorders: Secondary | ICD-10-CM | POA: Diagnosis not present

## 2020-06-11 DIAGNOSIS — R0683 Snoring: Secondary | ICD-10-CM | POA: Diagnosis not present

## 2020-06-11 DIAGNOSIS — G4733 Obstructive sleep apnea (adult) (pediatric): Secondary | ICD-10-CM | POA: Diagnosis not present

## 2020-06-11 DIAGNOSIS — R400 Somnolence: Secondary | ICD-10-CM

## 2020-06-14 ENCOUNTER — Other Ambulatory Visit: Payer: Self-pay | Admitting: Family

## 2020-06-14 ENCOUNTER — Other Ambulatory Visit: Payer: Self-pay | Admitting: Internal Medicine

## 2020-06-14 ENCOUNTER — Other Ambulatory Visit: Payer: Self-pay | Admitting: Physical Medicine & Rehabilitation

## 2020-06-14 DIAGNOSIS — M545 Low back pain, unspecified: Secondary | ICD-10-CM

## 2020-06-21 ENCOUNTER — Encounter: Payer: Self-pay | Admitting: Family

## 2020-06-26 DIAGNOSIS — R0981 Nasal congestion: Secondary | ICD-10-CM | POA: Diagnosis not present

## 2020-06-26 DIAGNOSIS — R6883 Chills (without fever): Secondary | ICD-10-CM | POA: Diagnosis not present

## 2020-06-26 DIAGNOSIS — R059 Cough, unspecified: Secondary | ICD-10-CM | POA: Diagnosis not present

## 2020-06-26 DIAGNOSIS — J4521 Mild intermittent asthma with (acute) exacerbation: Secondary | ICD-10-CM | POA: Diagnosis not present

## 2020-06-27 DIAGNOSIS — J069 Acute upper respiratory infection, unspecified: Secondary | ICD-10-CM | POA: Diagnosis not present

## 2020-06-27 DIAGNOSIS — R059 Cough, unspecified: Secondary | ICD-10-CM | POA: Diagnosis not present

## 2020-07-08 ENCOUNTER — Other Ambulatory Visit: Payer: Self-pay | Admitting: Internal Medicine

## 2020-07-11 ENCOUNTER — Encounter (HOSPITAL_BASED_OUTPATIENT_CLINIC_OR_DEPARTMENT_OTHER): Payer: Self-pay | Admitting: Cardiovascular Disease

## 2020-07-11 NOTE — Procedures (Signed)
    Patient Name: Cody Raymond, Cody Raymond Date: 06/11/2020 Gender: Male D.O.B: Sep 15, 1971 Age (years): 49 Referring Provider: Lorretta Harp Height (inches): 37 Interpreting Physician: Shelva Majestic MD, ABSM Weight (lbs): 195 RPSGT: Gerhard Perches BMI: 30 MRN: [id number] Neck Size: 16.00  CLINICAL INFORMATION Sleep Study Type: HST  Indication for sleep study: Snoring  Epworth Sleepiness Score: 4  SLEEP STUDY TECHNIQUE A multi-channel overnight portable sleep study was performed. The channels recorded were: nasal airflow, thoracic respiratory movement, and oxygen saturation with a pulse oximetry. Snoring was also monitored.  MEDICATIONS clonazePAM (KLONOPIN) 1 MG tablet zolpidem (AMBIEN) 10 MG tablet albuterol (PROVENTIL) (2.5 MG/3ML) 0.083% nebulizer solution Ascorbic Acid (VITAMIN C) 1000 MG tablet aspirin EC 81 MG EC tablet atorvastatin (LIPITOR) 80 MG tablet betamethasone dipropionate (DIPROLENE) 0.05 % cream cetirizine (ZYRTEC) 10 MG tablet diclofenac sodium (VOLTAREN) 1 % GEL DULoxetine (CYMBALTA) 30 MG capsule ezetimibe (ZETIA) 10 MG tablet fluticasone (FLONASE) 50 MCG/ACT nasal spray gabapentin (NEURONTIN) 300 MG capsule Icosapent Ethyl (VASCEPA) 1 g CAPS isosorbide mononitrate (IMDUR) 30 MG 24 hr tablet ketoconazole (NIZORAL) 2 % shampoo lisinopril (ZESTRIL) 10 MG tablet meloxicam (MOBIC) 7.5 MG tablet metFORMIN (GLUCOPHAGE-XR) 500 MG 24 hr tablet metoprolol tartrate (LOPRESSOR) 50 MG tablet montelukast (SINGULAIR) 10 MG tablet Multiple Vitamin (MULTIVITAMIN WITH MINERALS) TABS tablet nitroGLYCERIN (NITROSTAT) 0.4 MG SL tablet ondansetron (ZOFRAN) 4 MG tablet ONETOUCH VERIO test strip pantoprazole (PROTONIX) 40 MG tablet silver sulfADIAZINE (SILVADENE) 1 % cream tamsulosin (FLOMAX) 0.4 MG CAPS capsule tiZANidine (ZANAFLEX) 2 MG tablet traMADol (ULTRAM) 50 MG tablet traZODone (DESYREL) 100 MG tablet Patient self administered medications include:  N/A.  SLEEP ARCHITECTURE Patient was studied for 462.4 minutes. The sleep efficiency was 100.0 % and the patient was supine for 80.8%. The arousal index was 0.0 per hour.  RESPIRATORY PARAMETERS The overall AHI was 2.9 per hour, with a central apnea index of 0.0 per hour.  The oxygen nadir was 89% during sleep.  Mean heart rate during sleep was 64.9 bpm.  IMPRESSIONS - No significant obstructive sleep apnea occurred during this study (AHI 2.9/h); however, the effect in REM sleep cannot be assessed on this home study. - No significant central sleep apnea occurred during this study (CAI 0.0/h). - Minimal oxygen desaturation to a nadir of 89%. - Patient snored 26.7% during the sleep.  DIAGNOSIS - Increased Upper Airway Resistance (UARS) - Snoring  RECOMMENDATIONS - At present there is no indication for CPAP. - Effort should be made to optimize nasal and oropharyngeal patency. - Consider alternatives for the treatment of snoring. - Avoid alcohol, sedatives and other CNS depressants that may worsen sleep apnea and disrupt normal sleep architecture. - Sleep hygiene should be reviewed to assess factors that may improve sleep quality. - Weight management (BMI 30)and regular exercise should be initiated or continued.  [Electronically signed] 07/11/2020 09:52 AM  Shelva Majestic MD, [facility],  [place], American Board of Sleep Medicine   NPI: [id number] [place] PH: [phone]   FX: [phone] [place]

## 2020-07-13 ENCOUNTER — Telehealth: Payer: Self-pay | Admitting: *Deleted

## 2020-07-13 NOTE — Telephone Encounter (Signed)
Informed patient of sleep study results and patient understanding was verbalized. Patient understands her sleep study showed:   No significant obstructive sleep apnea occurred during this study (AHI 2.9/h); however, the effect in REM sleep cannot be assessed on this home study. - No significant central sleep apnea occurred during this study (CAI 0.0/h). - Minimal oxygen desaturation to a nadir of 89%. - Patient snored 26.7% during the sleep.  DIAGNOSIS - Increased Upper Airway Resistance (UARS) - Snoring  RECOMMENDATIONS - At present there is no indication for CPAP.  Left detailed message on voicemail and informed patient to call back with further questions.

## 2020-07-13 NOTE — Telephone Encounter (Signed)
-----   Message from Troy Sine, MD sent at 07/11/2020  9:57 AM EDT ----- Gae Bon, please notify pt of the results.

## 2020-07-14 ENCOUNTER — Other Ambulatory Visit: Payer: Self-pay | Admitting: Internal Medicine

## 2020-07-14 ENCOUNTER — Other Ambulatory Visit: Payer: Self-pay | Admitting: Physical Medicine & Rehabilitation

## 2020-07-14 ENCOUNTER — Other Ambulatory Visit: Payer: Self-pay | Admitting: Family

## 2020-07-14 ENCOUNTER — Other Ambulatory Visit: Payer: Self-pay | Admitting: Student

## 2020-07-15 NOTE — Telephone Encounter (Signed)
This is Dr. Berry's pt 

## 2020-07-26 ENCOUNTER — Other Ambulatory Visit: Payer: Self-pay | Admitting: Family

## 2020-07-30 ENCOUNTER — Encounter: Payer: Medicare Other | Admitting: Registered Nurse

## 2020-08-18 ENCOUNTER — Ambulatory Visit: Payer: Medicare Other | Admitting: Family

## 2020-08-18 ENCOUNTER — Ambulatory Visit: Payer: Medicare Other | Attending: Internal Medicine

## 2020-08-18 ENCOUNTER — Other Ambulatory Visit (HOSPITAL_BASED_OUTPATIENT_CLINIC_OR_DEPARTMENT_OTHER): Payer: Self-pay | Admitting: Internal Medicine

## 2020-08-18 DIAGNOSIS — Z23 Encounter for immunization: Secondary | ICD-10-CM

## 2020-08-18 MED FILL — PFIZER-BIONTECH COVID-19 VA: 30 | 1 days supply | Qty: 0 | Fill #0

## 2020-08-18 NOTE — Progress Notes (Signed)
   Covid-19 Vaccination Clinic  Name:  Cody Raymond    MRN: [id number] DOB: 1970-11-16  08/18/2020  Mr. [name] was observed post Covid-19 immunization for 15 minutes without incident. He was provided with Vaccine Information Sheet and instruction to access the V-Safe system.   Mr. [name] was instructed to call 911 with any severe reactions post vaccine: Marland Kitchen Difficulty breathing  . Swelling of face and throat  . A fast heartbeat  . A bad rash all over body  . Dizziness and weakness   Immunizations Administered    Name Date Dose VIS Date Route   Pfizer COVID-19 Vaccine 08/18/2020  9:07 AM 0.3 mL 07/08/2020 Intramuscular   Manufacturer: [place]   Lot: [id number]   [place]: [id number]

## 2020-09-01 ENCOUNTER — Ambulatory Visit: Payer: Medicare Other | Admitting: Family

## 2020-09-01 DIAGNOSIS — Z0289 Encounter for other administrative examinations: Secondary | ICD-10-CM

## 2020-09-04 ENCOUNTER — Other Ambulatory Visit: Payer: Self-pay | Admitting: Family

## 2020-09-04 ENCOUNTER — Other Ambulatory Visit: Payer: Self-pay | Admitting: Physical Medicine & Rehabilitation

## 2020-09-04 DIAGNOSIS — E785 Hyperlipidemia, unspecified: Secondary | ICD-10-CM

## 2020-09-21 ENCOUNTER — Encounter: Payer: Self-pay | Admitting: Family

## 2020-09-21 DIAGNOSIS — Z20822 Contact with and (suspected) exposure to covid-19: Secondary | ICD-10-CM | POA: Diagnosis not present

## 2020-09-21 DIAGNOSIS — J069 Acute upper respiratory infection, unspecified: Secondary | ICD-10-CM | POA: Diagnosis not present

## 2020-09-21 DIAGNOSIS — Z9189 Other specified personal risk factors, not elsewhere classified: Secondary | ICD-10-CM | POA: Diagnosis not present

## 2020-09-27 ENCOUNTER — Other Ambulatory Visit: Payer: Self-pay | Admitting: Family

## 2020-09-27 ENCOUNTER — Other Ambulatory Visit: Payer: Self-pay | Admitting: Physical Medicine & Rehabilitation

## 2020-09-27 DIAGNOSIS — M545 Low back pain, unspecified: Secondary | ICD-10-CM

## 2020-09-28 NOTE — Telephone Encounter (Signed)
Requesting: clonazepam 1mg  Contract: 08/21/2019 UDS: 08/21/2019 Last Visit: 05/18/2020 Next Visit: 09/29/20 Last Refill: 07/15/2020 #90 and 0RF  Please Advise

## 2020-09-29 ENCOUNTER — Telehealth (INDEPENDENT_AMBULATORY_CARE_PROVIDER_SITE_OTHER): Payer: Medicare Other | Admitting: Family

## 2020-09-29 ENCOUNTER — Other Ambulatory Visit: Payer: Self-pay

## 2020-09-29 ENCOUNTER — Encounter: Payer: Self-pay | Admitting: Family

## 2020-09-29 VITALS — BP 119/71 | HR 81 | Temp 98.7°F | Wt 175.0 lb

## 2020-09-29 DIAGNOSIS — B349 Viral infection, unspecified: Secondary | ICD-10-CM

## 2020-09-29 MED ORDER — PREDNISONE 10 MG PO TABS: ORAL_TABLET | ORAL | 0 refills | Status: DC

## 2020-09-29 MED ORDER — AZITHROMYCIN 250 MG PO TABS: ORAL_TABLET | ORAL | 0 refills | Status: DC

## 2020-09-29 NOTE — Progress Notes (Signed)
Virtual Visit via Video Note  I connected with Cody Raymond on 09/29/20 at 10:40 AM EST by a video enabled telemedicine application and verified that I am speaking with the correct person using two identifiers.  Location: Patient: home Provider: work   I discussed the limitations of evaluation and management by telemedicine and the availability of in person appointments. The patient expressed understanding and agreed to proceed. Only the patient and myself were present for today's video call.   History of Present Illness:  Patient is a 50 yr old male who presents today to discuss multiple symptoms.  He reports that his wife tested + for Covid on 12/30.  His 49 month old daughter also tested positive for COVID and he had been caring for her prior to her diagnosis. Pt had covid test on 1/4 (rapid negative) has not heard back re: PCR results and can't get through [facility] at [facility] to get results.  He was asymptomatic on 1/4, but developed symptoms on 09/25/19. Symptoms include:  Myalgia, headache, dark yellow phelgm. Coughing, no fever.   Using his albuterol nebulizer with minimal improvement. He does endorse sob.  He reports that he completed three [place] covid vaccines.    Oxygen 92-94%, 95% currently, HR 80 bpm   Gen: Awake, alert, no acute distress Resp: Breathing is even and non-labored Psych: calm/pleasant demeanor Neuro: Alert and Oriented x 3, + facial symmetry, speech is clear.   Assessment and Plan:  Viral illness- suspect covid-19 despite a negative early test. We discussed supportive measures. In addition, due to his hx of asthma and report of increased asthma symptoms, will rx with prednisone taper, azithromycin. Continue albuterol prn. Pt is advised to go to the ER if oxygen saturation drops <90% or if he develops increased shortness of breath. Pt verbalizes understanding.   Follow Up Instructions:    I discussed the assessment and treatment plan with the  patient. The patient was provided an opportunity to ask questions and all were answered. The patient agreed with the plan and demonstrated an understanding of the instructions.   The patient was advised to call back or seek an in-person evaluation if the symptoms worsen or if the condition fails to improve as anticipated.  Nance Pear, NP

## 2020-10-10 ENCOUNTER — Other Ambulatory Visit: Payer: Self-pay | Admitting: Family

## 2020-10-12 NOTE — Telephone Encounter (Signed)
Requesting: Ambien 10mg  Contract: 08/21/2019   UDS: 08/21/2019 Last Visit: 06/15/2020 Next Visit: None Last Refill: 06/15/2020 #90 and 0RF  Please Advise

## 2020-11-12 ENCOUNTER — Other Ambulatory Visit: Payer: Self-pay

## 2020-11-12 MED ORDER — KETOCONAZOLE 2 % EX SHAM: MEDICATED_SHAMPOO | CUTANEOUS | 1 refills | Status: AC

## 2020-11-27 ENCOUNTER — Other Ambulatory Visit: Payer: Self-pay

## 2020-12-03 ENCOUNTER — Encounter: Payer: Self-pay | Admitting: Family

## 2020-12-03 DIAGNOSIS — L989 Disorder of the skin and subcutaneous tissue, unspecified: Secondary | ICD-10-CM

## 2020-12-07 ENCOUNTER — Other Ambulatory Visit: Payer: Self-pay

## 2020-12-07 ENCOUNTER — Encounter: Payer: Self-pay | Admitting: Family

## 2020-12-07 MED ORDER — PANTOPRAZOLE SODIUM 40 MG PO TBEC: 40.0000 mg | DELAYED_RELEASE_TABLET | Freq: Every day | ORAL | 0 refills | Status: DC

## 2020-12-15 NOTE — Telephone Encounter (Signed)
Gwen, see pt mychart.  Can you see if [facility] dermatology or [place] [facility] across the street have a shorter wait time please?

## 2020-12-18 ENCOUNTER — Encounter: Payer: Self-pay | Admitting: Family

## 2020-12-20 ENCOUNTER — Encounter: Payer: Self-pay | Admitting: Family

## 2020-12-20 ENCOUNTER — Other Ambulatory Visit: Payer: Self-pay | Admitting: Family

## 2020-12-21 ENCOUNTER — Other Ambulatory Visit: Payer: Self-pay

## 2020-12-22 DIAGNOSIS — D229 Melanocytic nevi, unspecified: Secondary | ICD-10-CM | POA: Diagnosis not present

## 2020-12-22 DIAGNOSIS — L218 Other seborrheic dermatitis: Secondary | ICD-10-CM | POA: Diagnosis not present

## 2020-12-22 DIAGNOSIS — D225 Melanocytic nevi of trunk: Secondary | ICD-10-CM | POA: Diagnosis not present

## 2020-12-24 ENCOUNTER — Other Ambulatory Visit: Payer: Self-pay

## 2020-12-24 MED ORDER — DULOXETINE HCL 30 MG PO CPEP: 30.0000 mg | ORAL_CAPSULE | Freq: Every day | ORAL | 1 refills | Status: DC

## 2020-12-24 MED ORDER — PANTOPRAZOLE SODIUM 40 MG PO TBEC: 40.0000 mg | DELAYED_RELEASE_TABLET | Freq: Every day | ORAL | 0 refills | Status: DC

## 2021-01-19 ENCOUNTER — Other Ambulatory Visit: Payer: Self-pay | Admitting: Family

## 2021-01-25 DIAGNOSIS — J209 Acute bronchitis, unspecified: Secondary | ICD-10-CM | POA: Diagnosis not present

## 2021-01-29 ENCOUNTER — Encounter (INDEPENDENT_AMBULATORY_CARE_PROVIDER_SITE_OTHER): Payer: Self-pay

## 2021-01-29 DIAGNOSIS — Q142 Congenital malformation of optic disc: Secondary | ICD-10-CM | POA: Diagnosis not present

## 2021-01-29 DIAGNOSIS — H33103 Unspecified retinoschisis, bilateral: Secondary | ICD-10-CM | POA: Diagnosis not present

## 2021-01-29 DIAGNOSIS — H2513 Age-related nuclear cataract, bilateral: Secondary | ICD-10-CM | POA: Diagnosis not present

## 2021-01-29 DIAGNOSIS — D3132 Benign neoplasm of left choroid: Secondary | ICD-10-CM | POA: Diagnosis not present

## 2021-01-29 DIAGNOSIS — E119 Type 2 diabetes mellitus without complications: Secondary | ICD-10-CM | POA: Diagnosis not present

## 2021-01-31 ENCOUNTER — Other Ambulatory Visit: Payer: Self-pay | Admitting: Family

## 2021-01-31 DIAGNOSIS — E785 Hyperlipidemia, unspecified: Secondary | ICD-10-CM

## 2021-02-01 ENCOUNTER — Telehealth: Payer: Self-pay | Admitting: Family

## 2021-02-01 MED ORDER — MONTELUKAST SODIUM 10 MG PO TABS: 1.0000 | ORAL_TABLET | Freq: Every day | ORAL | 0 refills | Status: DC

## 2021-02-01 MED ORDER — PANTOPRAZOLE SODIUM 40 MG PO TBEC: 40.0000 mg | DELAYED_RELEASE_TABLET | Freq: Every day | ORAL | 0 refills | Status: DC

## 2021-02-01 MED ORDER — ATORVASTATIN CALCIUM 80 MG PO TABS: 1.0000 | ORAL_TABLET | Freq: Every day | ORAL | 0 refills | Status: DC

## 2021-02-01 MED ORDER — EZETIMIBE 10 MG PO TABS: 10.0000 mg | ORAL_TABLET | Freq: Every day | ORAL | 0 refills | Status: DC

## 2021-02-01 MED ORDER — CLONAZEPAM 1 MG PO TABS: 1.0000 mg | ORAL_TABLET | Freq: Every day | ORAL | 0 refills | Status: DC

## 2021-02-01 NOTE — Telephone Encounter (Signed)
Requesting: clonazepam 1mg   Contract:08/21/2019 UDS: 08/21/2019 Last Visit: 09/29/20 Next Visit: None Last Refill: 09/28/2020 #90 and 0RF Pt sig: 1 tab daily  Please Advise

## 2021-02-01 NOTE — Telephone Encounter (Signed)
Cody Raymond is on the phone asking about meds clonazePAM (KLONOPIN) 1 MG tablet . He stated the pharm told him he couldn't get meds for 2 weeks. He has increased his dosage to  1 and 1/2 daily so he has ran out. He wants to now what can be done to get his meds before the 2 weeks is up.

## 2021-02-01 NOTE — Telephone Encounter (Signed)
See mychart message from Pt.  

## 2021-02-02 MED ORDER — CLONAZEPAM 1 MG PO TABS: ORAL_TABLET | ORAL | 2 refills | Status: DC

## 2021-02-02 NOTE — Addendum Note (Signed)
Addended by: [name] on: 02/02/2021 01:04 PM   Modules accepted: Orders

## 2021-02-09 ENCOUNTER — Encounter (INDEPENDENT_AMBULATORY_CARE_PROVIDER_SITE_OTHER): Payer: Self-pay | Admitting: Ophthalmology

## 2021-02-09 ENCOUNTER — Other Ambulatory Visit: Payer: Self-pay

## 2021-02-09 ENCOUNTER — Ambulatory Visit (INDEPENDENT_AMBULATORY_CARE_PROVIDER_SITE_OTHER): Payer: Medicare Other | Admitting: Ophthalmology

## 2021-02-09 DIAGNOSIS — H35351 Cystoid macular degeneration, right eye: Secondary | ICD-10-CM | POA: Diagnosis not present

## 2021-02-09 DIAGNOSIS — H35352 Cystoid macular degeneration, left eye: Secondary | ICD-10-CM | POA: Diagnosis not present

## 2021-02-09 DIAGNOSIS — H3552 Pigmentary retinal dystrophy: Secondary | ICD-10-CM | POA: Diagnosis not present

## 2021-02-09 NOTE — Assessment & Plan Note (Signed)
We will plan fluorescein angiography to look for CME confirmation  Moreover we will look for signs of RPE diffuse atrophy and/or pigmentary changes consistent with RPE

## 2021-02-09 NOTE — Progress Notes (Signed)
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 Dawson Bills states, "I do have FOL a lot especially when I sneeze and I have trouble looking down the scope of my gun to shoot. Things get blurred."/[name] 50 yo daughter has RP and it is thought to have been inherited from her mother's side of the family./A1C:5.6/LBS:109)   HISTORY OF PRESENT ILLNESS: Cody Raymond is a 50 y.o. male who presents to the clinic today for:   HPI    Retina Evaluation    Laterality: both eyes   Onset: 1 week ago   Duration: 1 week   Associated Symptoms: Flashes (Reports FOL OU- temporally OU "all the time").  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 Midge Aver Pt states, "I do have FOL a lot especially when I sneeze and I have trouble looking down the scope of my gun to shoot. Things get blurred." [name] 32 yo daughter has RP and it is thought to have been inherited from her mother's side of the family. A1C:5.6 LBS:109       Last edited by Kendra Opitz, COA on 02/09/2021  2:44 PM. (History)      Referring physician: [name], NP [address] [place],  [place] [address]  HISTORICAL INFORMATION:   Selected notes from the MEDICAL RECORD NUMBER    Lab Results  Component Value Date   HGBA1C 5.5 05/18/2020     CURRENT MEDICATIONS: No current outpatient medications on file. (Ophthalmic Drugs)   No current facility-administered medications for this visit. (Ophthalmic Drugs)   Current Outpatient Medications (Other)  Medication Sig  . albuterol (PROVENTIL) (2.5 MG/3ML) 0.083% nebulizer solution Take 3 mLs (2.5 mg total) by nebulization every 6 (six) hours as needed for wheezing or shortness of breath.  . Ascorbic Acid (VITAMIN C) 1000 MG tablet Take 1,000 mg by mouth daily.  Marland Kitchen aspirin EC 81 MG EC tablet Take 1 tablet  (81 mg total) by mouth daily.  Marland Kitchen atorvastatin (LIPITOR) 80 MG tablet Take 1 tablet (80 mg total) by mouth daily.  Marland Kitchen azithromycin (ZITHROMAX) 250 MG tablet Take 2 tabs by mouth today, then one tab once daily for 4 more days.  . betamethasone dipropionate (DIPROLENE) 0.05 % cream Apply topically 2 (two) times daily.  . cetirizine (ZYRTEC) 10 MG tablet Take 10 mg by mouth daily.  . clonazePAM (KLONOPIN) 1 MG tablet Take 1/2 tab PO in the AM and 1 tab PO in the PM  . COVID-19 mRNA vaccine, Pfizer, 30 MCG/0.3ML injection INJECT AS DIRECTED  . diclofenac sodium (VOLTAREN) 1 % GEL Apply 2 g topically 4 (four) times daily as needed (pain).  . DULoxetine (CYMBALTA) 30 MG capsule Take 1 capsule (30 mg total) by mouth daily.  Marland Kitchen ezetimibe (ZETIA) 10 MG tablet Take 1 tablet (10 mg total) by mouth daily.  . fluticasone (FLONASE) 50 MCG/ACT nasal spray Place 2 sprays into both nostrils daily.  Marland Kitchen gabapentin (NEURONTIN) 300 MG capsule TAKE 1 CAPSULE BY MOUTH AT  6 AM, 11:30 AM AND 4:30 PM  AND TAKE 3 CAPSULES BY  MOUTH AT 10:00 PM  . Icosapent Ethyl (VASCEPA) 1 g CAPS Take 2 capsules (2 g total) by mouth 2 (two) times daily.  . isosorbide mononitrate (IMDUR) 30 MG 24 hr tablet TAKE 1/2 TABLET BY MOUTH DAILY  . ketoconazole (NIZORAL)  2 % shampoo Apply topically 2 (two) times a week.  Marland Kitchen lisinopril (ZESTRIL) 10 MG tablet Take 1 tablet (10 mg total) by mouth daily.  Marland Kitchen lisinopril (ZESTRIL) 5 MG tablet Take 5 mg by mouth daily.  . meloxicam (MOBIC) 7.5 MG tablet Take 1 tablet (7.5 mg total) by mouth daily as needed for pain.  . metFORMIN (GLUCOPHAGE-XR) 500 MG 24 hr tablet TAKE 2 TABLETS BY MOUTH  DAILY WITH BREAKFAST  . metoprolol tartrate (LOPRESSOR) 25 MG tablet Take by mouth.  . metoprolol tartrate (LOPRESSOR) 50 MG tablet TAKE 1 TABLET BY MOUTH  TWICE DAILY  . montelukast (SINGULAIR) 10 MG tablet Take 1 tablet (10 mg total) by mouth at bedtime.  . Multiple Vitamin (MULTIVITAMIN WITH MINERALS) TABS tablet Take 1  tablet by mouth daily.  . nitroGLYCERIN (NITROSTAT) 0.4 MG SL tablet Place 1 tablet (0.4 mg total) under the tongue every 5 (five) minutes as needed for chest pain.  Marland Kitchen ondansetron (ZOFRAN) 4 MG tablet Take 1 tablet (4 mg total) by mouth every 8 (eight) hours as needed for nausea or vomiting.  Glory Rosebush VERIO test strip USE WITH METER TO CHECK  BLOOD SUGAR 3 TIMES DAILY  . pantoprazole (PROTONIX) 40 MG tablet Take 1 tablet (40 mg total) by mouth daily.  . predniSONE (DELTASONE) 10 MG tablet 4 tabs by mouth once daily for 2 days, then 3 tabs daily x 2 days, then 2 tabs daily x 2 days, then 1 tab daily x 2 days  . sertraline (ZOLOFT) 50 MG tablet Take 50 mg by mouth daily.  . silver sulfADIAZINE (SILVADENE) 1 % cream Apply 1 application topically daily.  . tamsulosin (FLOMAX) 0.4 MG CAPS capsule Take 1 capsule (0.4 mg total) by mouth daily.  Marland Kitchen tiZANidine (ZANAFLEX) 2 MG tablet TAKE 1 TABLET BY MOUTH  EVERY 6 HOURS AS NEEDED FOR MUSCLE SPASM(S)  . traMADol (ULTRAM) 50 MG tablet Take 1 tablet (50 mg total) by mouth 2 (two) times daily.  . traZODone (DESYREL) 100 MG tablet Take 1 tablet (100 mg total) by mouth at bedtime.  Marland Kitchen zolpidem (AMBIEN) 10 MG tablet TAKE 1 TABLET BY MOUTH AT  BEDTIME AS NEEDED FOR SLEEP   No current facility-administered medications for this visit. (Other)      REVIEW OF SYSTEMS:    ALLERGIES Allergies  Allergen Reactions  . Benadryl [Diphenhydramine Hcl]     "drives me nuts"    PAST MEDICAL HISTORY Past Medical History:  Diagnosis Date  . Aneurysm ([place])   . Asthma   . Blood transfusion   . CAD (coronary artery disease), non obstructive on cath 2011 04/05/2012  . Coronary artery disease   . Diabetes mellitus   . Diabetes mellitus without complication ([place])   . Enlarged prostate   . GERD (gastroesophageal reflux disease)   . H/O syncope   . Heart disease   . History of repair of patent ductus arteriosus 07/23/2016  . Hyperlipidemia   . Hypertension   .  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 Refusal of blood transfusions as patient is Jehovah's Witness    Past Surgical History:  Procedure Laterality Date  . APPENDECTOMY    . BACK SURGERY    . CARDIAC CATHETERIZATION  2007   Clean Cardiac Cath ([place]), with RCA 30% narrowing likely due to catheter induced spasm in 09/02/10.  Marland Kitchen CARDIAC CATHETERIZATION  09/02/2010   mod. nonobstructive disease in the RCA and CX, tortuous LAD  . COLONOSCOPY  W/ POLYPECTOMY  03/17/11   diminutive polyp  . LOOP RECORDER EXPLANT N/A 01/14/2014   Procedure: LOOP RECORDER EXPLANT;  Surgeon: Sanda Klein, MD;  Location: [facility] CATH LAB;  Service: Cardiovascular;  Laterality: N/A;  . LOOP RECORDER IMPLANT  04/06/2012   Reveal XT 0277  . LOOP RECORDER IMPLANT N/A 04/06/2012   Procedure: LOOP RECORDER IMPLANT;  Surgeon: Sanda Klein, MD;  Location: [facility] CATH LAB;  Service: Cardiovascular;  Laterality: N/A;  . NM MYOCAR PERF WALL MOTION  01/25/2008   mild anteroapical wall ischemia  . PATENT DUCTUS ARTERIOUS REPAIR     at age 13    FAMILY HISTORY Family History  Problem Relation Age of Onset  . Colon cancer Paternal Grandfather   . Prostate cancer Paternal Grandfather   . Aneurysm Father   . Heart attack Father 46  . Coronary artery disease Father   . Colon polyps Mother   . Heart disease Mother   . Coronary artery disease Mother   . Migraines Mother   . Breast cancer Maternal Grandmother   . Colon cancer Paternal Uncle        x 2  . Stomach cancer Brother   . Liver disease Other        unsure who it was  . Allergies Daughter     SOCIAL HISTORY Social History   Tobacco Use  . Smoking status: Never Smoker  . Smokeless tobacco: Never Used  Vaping Use  . Vaping Use: Never used  Substance Use Topics  . Alcohol use: No  . Drug use: No         OPHTHALMIC EXAM: Base Eye Exam    Visual Acuity (ETDRS)      Right Left   Dist [place] 20/40 20/50 +2   Dist ph [place] NI 20/30       Tonometry  (Tonopen, 2:49 PM)      Right Left   Pressure 15 15       Pupils      Pupils Dark Light Shape React APD   Right PERRL 4 3 Round Brisk None   Left PERRL 4 3 Round Brisk None       Visual Fields (Counting fingers)      Left Right    Full Full       Extraocular Movement      Right Left    Full Full       Neuro/Psych    Oriented x3: Yes       Dilation    Both eyes: 1.0% Mydriacyl, 2.5% Phenylephrine @ 2:49 PM        Slit Lamp and Fundus Exam    External Exam      Right Left   External Normal Normal       Slit Lamp Exam      Right Left   Lids/Lashes Normal Normal   Conjunctiva/Sclera White and quiet White and quiet   Cornea Clear Clear   Anterior Chamber Deep and quiet Deep and quiet   Iris Round and reactive Round and reactive   Lens 1+ Nuclear sclerosis 1+ Nuclear sclerosis   Anterior Vitreous Normal Normal       Fundus Exam      Right Left   Posterior Vitreous Normal Normal   Disc Myelinated nerve fiber one third of the nerve rim Myelinated nerve fiber nearly encircles the rim of the nerve   C/D Ratio 0.1 0.1   Macula Normal Normal   Vessels Normal Normal  Periphery Bone spicule type pigmentation sectorial, inferior between 5 and 7:00 peripherally Normal, no bone spicule pigmentation noted          IMAGING AND PROCEDURES  Imaging and Procedures for 02/09/21  OCT, Retina - OU - Both Eyes       Right Eye Quality was good. Scan locations included subfoveal. Central Foveal Thickness: 272. Progression has no prior data. Findings include abnormal foveal contour.   Left Eye Quality was good. Scan locations included subfoveal. Central Foveal Thickness: 254. Progression has no prior data. Findings include abnormal foveal contour.   Notes Intraretinal's mini cystoid change, nasal to the fovea OD, no overt CME.  This does extend into the inferior aspect of the macula however ringing the foveal region  OS with small amounts of intraretinal CME  particularly nasal extending along the superior half of the macular region as well.  Some also extends inferior portion of the macula.  This does suggest possible or prior MAC-TEL type finding will need corroborating fluorescein angiography to determine whether active micro leakages are present                ASSESSMENT/PLAN:  Retinitis pigmentosa of right eye We will plan fluorescein angiography to look for CME confirmation  Moreover we will look for signs of RPE diffuse atrophy and/or pigmentary changes consistent with RPE      ICD-10-CM   1. Cystoid macular edema of right eye  H35.351 OCT, Retina - OU - Both Eyes  2. Cystoid macular edema of left eye  H35.352 OCT, Retina - OU - Both Eyes  3. Retinitis pigmentosa of right eye  H35.52     1.  OD, with micro-CME in conjunction with typical bone spicule pigmentation yet limited in his sectorial region inferiorly in the right eye.  This could be recessive form of RPE or could be RPE sine pigmento, and thus would also need to have visual field testing  2.  CME OU, would be a component of RPE as well also could be from possibly hypoxic maculopathy although he did have sleep apnea testing some 2 to 3 years previously and for what ever reason has already been scheduled again for another sleep apnea testing, due to heart condition.  3.  He will need visual field testing if not already done  Ophthalmic Meds Ordered this visit:  No orders of the defined types were placed in this encounter.      Return in about 1 week (around 02/16/2021) for DILATE OU, COLOR FP, OPTOS FFA L/R.  There are no Patient Instructions on file for this visit.   Explained the diagnoses, plan, and follow up with the patient and they expressed understanding.  Patient expressed understanding of the importance of proper follow up care.   Clent Demark Matina Rodier M.D. Diseases & Surgery of the Retina and Vitreous Retina & Diabetic [place] 02/09/21     Abbreviations: M myopia (nearsighted); A astigmatism; H hyperopia (farsighted); P presbyopia; Mrx spectacle prescription;  CTL contact lenses; OD right eye; OS left eye; OU both eyes  XT exotropia; ET esotropia; PEK punctate epithelial keratitis; PEE punctate epithelial erosions; DES dry eye syndrome; MGD meibomian gland dysfunction; ATs artificial tears; PFAT's preservative free artificial tears; [place] nuclear sclerotic cataract; PSC posterior subcapsular cataract; ERM epi-retinal membrane; PVD posterior vitreous detachment; RD retinal detachment; DM diabetes mellitus; DR diabetic retinopathy; NPDR non-proliferative diabetic retinopathy; PDR proliferative diabetic retinopathy; CSME clinically significant macular edema; DME diabetic macular edema; dbh dot blot  hemorrhages; CWS cotton wool spot; POAG primary open angle glaucoma; C/D cup-to-disc ratio; HVF humphrey visual field; GVF goldmann visual field; OCT optical coherence tomography; IOP intraocular pressure; BRVO Branch retinal vein occlusion; CRVO central retinal vein occlusion; CRAO central retinal artery occlusion; BRAO branch retinal artery occlusion; RT retinal tear; SB scleral buckle; PPV pars plana vitrectomy; VH Vitreous hemorrhage; PRP panretinal laser photocoagulation; IVK intravitreal kenalog; VMT vitreomacular traction; MH Macular hole;  NVD neovascularization of the disc; NVE neovascularization elsewhere; AREDS age related eye disease study; ARMD age related macular degeneration; POAG primary open angle glaucoma; EBMD epithelial/anterior basement membrane dystrophy; ACIOL anterior chamber intraocular lens; IOL intraocular lens; PCIOL posterior chamber intraocular lens; Phaco/IOL phacoemulsification with intraocular lens placement; [place] photorefractive keratectomy; LASIK laser assisted in situ keratomileusis; HTN hypertension; DM diabetes mellitus; COPD chronic obstructive pulmonary disease

## 2021-02-13 ENCOUNTER — Other Ambulatory Visit: Payer: Self-pay | Admitting: Family

## 2021-02-16 ENCOUNTER — Encounter (INDEPENDENT_AMBULATORY_CARE_PROVIDER_SITE_OTHER): Payer: Self-pay | Admitting: Ophthalmology

## 2021-02-16 ENCOUNTER — Other Ambulatory Visit: Payer: Self-pay

## 2021-02-16 ENCOUNTER — Ambulatory Visit (INDEPENDENT_AMBULATORY_CARE_PROVIDER_SITE_OTHER): Payer: Medicare Other | Admitting: Ophthalmology

## 2021-02-16 DIAGNOSIS — H35351 Cystoid macular degeneration, right eye: Secondary | ICD-10-CM | POA: Diagnosis not present

## 2021-02-16 DIAGNOSIS — H3552 Pigmentary retinal dystrophy: Secondary | ICD-10-CM

## 2021-02-16 DIAGNOSIS — H35352 Cystoid macular degeneration, left eye: Secondary | ICD-10-CM | POA: Diagnosis not present

## 2021-02-16 LAB — HM DIABETES EYE EXAM

## 2021-02-16 MED ORDER — FLUORESCEIN SODIUM 10 % IV SOLN
500.0000 mg | INTRAVENOUS | Status: AC | PRN
  Administered 2021-02-16: 500 mg via INTRAVENOUS

## 2021-02-16 NOTE — Progress Notes (Signed)
02/16/2021     CHIEF COMPLAINT Patient presents for Retina Follow Up (1 Wk FP/FFA L/R//Pt denies noticeable changes to [place] OU since last visit. Pt denies ocular pain, flashes of light, or floaters OU. /LBS: 91 this AM/)   HISTORY OF PRESENT ILLNESS: Cody Raymond is a 50 y.o. male who presents to the clinic today for:   HPI    Retina Follow Up    Diagnosis: Other   Laterality: both eyes   Onset: 1 week ago   Severity: mild   Duration: 1 week   Course: stable   Comments: 1 Wk FP/FFA L/R  Pt denies noticeable changes to [place] OU since last visit. Pt denies ocular pain, flashes of light, or floaters OU.  LBS: 91 this AM        Last edited by [name], [name] on 02/16/2021  3:43 PM. (History)      Referring physician: [name], NP [address] [place],   [address]  HISTORICAL INFORMATION:   Selected notes from the MEDICAL RECORD NUMBER    Lab Results  Component Value Date   HGBA1C 5.5 05/18/2020     CURRENT MEDICATIONS: No current outpatient medications on file. (Ophthalmic Drugs)   No current facility-administered medications for this visit. (Ophthalmic Drugs)   Current Outpatient Medications (Other)  Medication Sig  . albuterol (PROVENTIL) (2.5 MG/3ML) 0.083% nebulizer solution Take 3 mLs (2.5 mg total) by nebulization every 6 (six) hours as needed for wheezing or shortness of breath.  . Ascorbic Acid (VITAMIN C) 1000 MG tablet Take 1,000 mg by mouth daily.  Marland Kitchen aspirin EC 81 MG EC tablet Take 1 tablet (81 mg total) by mouth daily.  Marland Kitchen atorvastatin (LIPITOR) 80 MG tablet Take 1 tablet (80 mg total) by mouth daily.  Marland Kitchen azithromycin (ZITHROMAX) 250 MG tablet Take 2 tabs by mouth today, then one tab once daily for 4 more days.  . betamethasone dipropionate (DIPROLENE) 0.05 % cream Apply topically 2 (two) times daily.  . cetirizine (ZYRTEC) 10 MG tablet Take 10 mg by mouth daily.  . clonazePAM (KLONOPIN) 1 MG tablet Take 1/2 tab PO in  the AM and 1 tab PO in the PM  . COVID-19 mRNA vaccine, Pfizer, 30 MCG/0.3ML injection INJECT AS DIRECTED  . diclofenac sodium (VOLTAREN) 1 % GEL Apply 2 g topically 4 (four) times daily as needed (pain).  . DULoxetine (CYMBALTA) 30 MG capsule Take 1 capsule (30 mg total) by mouth daily.  Marland Kitchen ezetimibe (ZETIA) 10 MG tablet Take 1 tablet (10 mg total) by mouth daily.  . fluticasone (FLONASE) 50 MCG/ACT nasal spray Place 2 sprays into both nostrils daily.  Marland Kitchen gabapentin (NEURONTIN) 300 MG capsule TAKE 1 CAPSULE BY MOUTH AT  6 AM, 11:30 AM AND 4:30 PM  AND TAKE 3 CAPSULES BY  MOUTH AT 10:00 PM  . Icosapent Ethyl (VASCEPA) 1 g CAPS Take 2 capsules (2 g total) by mouth 2 (two) times daily.  . isosorbide mononitrate (IMDUR) 30 MG 24 hr tablet TAKE 1/2 TABLET BY MOUTH DAILY  . ketoconazole (NIZORAL) 2 % shampoo Apply topically 2 (two) times a week.  Marland Kitchen lisinopril (ZESTRIL) 10 MG tablet Take 1 tablet (10 mg total) by mouth daily.  Marland Kitchen lisinopril (ZESTRIL) 5 MG tablet Take 5 mg by mouth daily.  . meloxicam (MOBIC) 7.5 MG tablet Take 1 tablet (7.5 mg total) by mouth daily as needed for pain.  . metFORMIN (GLUCOPHAGE-XR) 500 MG 24 hr tablet TAKE  2 TABLETS BY MOUTH  DAILY WITH BREAKFAST  . metoprolol tartrate (LOPRESSOR) 25 MG tablet Take by mouth.  . metoprolol tartrate (LOPRESSOR) 50 MG tablet TAKE 1 TABLET BY MOUTH  TWICE DAILY  . montelukast (SINGULAIR) 10 MG tablet Take 1 tablet (10 mg total) by mouth at bedtime.  . Multiple Vitamin (MULTIVITAMIN WITH MINERALS) TABS tablet Take 1 tablet by mouth daily.  . nitroGLYCERIN (NITROSTAT) 0.4 MG SL tablet Place 1 tablet (0.4 mg total) under the tongue every 5 (five) minutes as needed for chest pain.  Marland Kitchen ondansetron (ZOFRAN) 4 MG tablet Take 1 tablet (4 mg total) by mouth every 8 (eight) hours as needed for nausea or vomiting.  Glory Rosebush VERIO test strip USE WITH METER TO CHECK  BLOOD SUGAR 3 TIMES DAILY  . pantoprazole (PROTONIX) 40 MG tablet Take 1 tablet (40 mg  total) by mouth daily.  . predniSONE (DELTASONE) 10 MG tablet 4 tabs by mouth once daily for 2 days, then 3 tabs daily x 2 days, then 2 tabs daily x 2 days, then 1 tab daily x 2 days  . sertraline (ZOLOFT) 50 MG tablet Take 50 mg by mouth daily.  . silver sulfADIAZINE (SILVADENE) 1 % cream Apply 1 application topically daily.  . tamsulosin (FLOMAX) 0.4 MG CAPS capsule Take 1 capsule (0.4 mg total) by mouth daily.  Marland Kitchen tiZANidine (ZANAFLEX) 2 MG tablet TAKE 1 TABLET BY MOUTH  EVERY 6 HOURS AS NEEDED FOR MUSCLE SPASM(S)  . traMADol (ULTRAM) 50 MG tablet Take 1 tablet (50 mg total) by mouth 2 (two) times daily.  . traZODone (DESYREL) 100 MG tablet TAKE 1 TABLET BY MOUTH AT  BEDTIME  . zolpidem (AMBIEN) 10 MG tablet TAKE 1 TABLET BY MOUTH AT  BEDTIME AS NEEDED FOR SLEEP   No current facility-administered medications for this visit. (Other)      REVIEW OF SYSTEMS:    ALLERGIES Allergies  Allergen Reactions  . Benadryl [Diphenhydramine Hcl]     "drives me nuts"    PAST MEDICAL HISTORY Past Medical History:  Diagnosis Date  . Aneurysm ([place])   . Asthma   . Blood transfusion   . CAD (coronary artery disease), non obstructive on cath 2011 04/05/2012  . Coronary artery disease   . Diabetes mellitus   . Diabetes mellitus without complication ([place])   . Enlarged prostate   . GERD (gastroesophageal reflux disease)   . H/O syncope   . Heart disease   . History of repair of patent ductus arteriosus 07/23/2016  . Hyperlipidemia   . Hypertension   . 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 Refusal of blood transfusions as patient is Jehovah's Witness    Past Surgical History:  Procedure Laterality Date  . APPENDECTOMY    . BACK SURGERY    . CARDIAC CATHETERIZATION  2007   Clean Cardiac Cath ([place]), with RCA 30% narrowing likely due to catheter induced spasm in 09/02/10.  Marland Kitchen CARDIAC CATHETERIZATION  09/02/2010   mod. nonobstructive disease in the RCA and CX, tortuous LAD  .  COLONOSCOPY W/ POLYPECTOMY  03/17/11   diminutive polyp  . LOOP RECORDER EXPLANT N/A 01/14/2014   Procedure: LOOP RECORDER EXPLANT;  Surgeon: Sanda Klein, MD;  Location: [facility] CATH LAB;  Service: Cardiovascular;  Laterality: N/A;  . LOOP RECORDER IMPLANT  04/06/2012   Reveal XT 3220  . LOOP RECORDER IMPLANT N/A 04/06/2012   Procedure: LOOP RECORDER IMPLANT;  Surgeon: Sanda Klein, MD;  [facility] CATH  LAB;  Service: Cardiovascular;  Laterality: N/A;  . NM MYOCAR PERF WALL MOTION  01/25/2008   mild anteroapical wall ischemia  . PATENT DUCTUS ARTERIOUS REPAIR     at age 64    FAMILY HISTORY Family History  Problem Relation Age of Onset  . Colon cancer Paternal Grandfather   . Prostate cancer Paternal Grandfather   . Aneurysm Father   . Heart attack Father 11  . Coronary artery disease Father   . Colon polyps Mother   . Heart disease Mother   . Coronary artery disease Mother   . Migraines Mother   . Breast cancer Maternal Grandmother   . Colon cancer Paternal Uncle        x 2  . Stomach cancer Brother   . Liver disease Other        unsure who it was  . Allergies Daughter     SOCIAL HISTORY Social History   Tobacco Use  . Smoking status: Never Smoker  . Smokeless tobacco: Never Used  Vaping Use  . Vaping Use: Never used  Substance Use Topics  . Alcohol use: No  . Drug use: No         OPHTHALMIC EXAM:  Base Eye Exam    Visual Acuity (ETDRS)      Right Left   Dist [place] 20/30 20/25   Dist ph [place] NI        Tonometry (Tonopen, 3:46 PM)      Right Left   Pressure 17 17       Pupils      Pupils Dark Light Shape React APD   Right PERRL 5 4 Round Brisk None   Left PERRL 5 4 Round Brisk None       Visual Fields (Counting fingers)      Left Right    Full Full       Extraocular Movement      Right Left    Full Full       Neuro/Psych    Oriented x3: Yes   Mood/Affect: Normal       Dilation    Both eyes: 1.0% Mydriacyl, 2.5% Phenylephrine @ 3:46 PM         Slit Lamp and Fundus Exam    External Exam      Right Left   External Normal Normal       Slit Lamp Exam      Right Left   Lids/Lashes Normal Normal   Conjunctiva/Sclera White and quiet White and quiet   Cornea Clear Clear   Anterior Chamber Deep and quiet Deep and quiet   Iris Round and reactive Round and reactive   Lens 1+ Nuclear sclerosis 1+ Nuclear sclerosis   Anterior Vitreous Normal Normal       Fundus Exam      Right Left   Posterior Vitreous Normal Normal   Disc Myelinated nerve fiber one third of the nerve rim Myelinated nerve fiber nearly encircles the rim of the nerve   C/D Ratio 0.1 0.1   Macula Normal Normal   Vessels Normal Normal   Periphery Bone spicule type pigmentation sectorial, inferior between 5 and 7:00 peripherally Normal, no bone spicule pigmentation noted          IMAGING AND PROCEDURES  Imaging and Procedures for 02/16/21  Color Fundus Photography Optos - OU - Both Eyes       Right Eye Progression has no prior data. Macula : normal observations. Vessels :  normal observations.   Left Eye Progression has no prior data.   Notes PeripheralBone spicule pigmentation posterior pole and inferiorly.  OD with medullary nerve fibers on the superior pole of the optic nerve.  This is not pathologic.    OS with medullary nerve fibers nearly 360 around the nerve not pathologic.       Fluorescein Angiography Optos (Transit OS)       Injection:  500 mg Fluorescein Sodium 10 % injection   NDC: [phone]   Route: Intravenous, Site: Left ArmRight Eye   Progression has no prior data. Mid/Late phase findings include window defect. Choroidal neovascularization is not present.   Left Eye   Progression has no prior data. Early phase findings include window defect. Mid/Late phase findings include window defect. Choroidal neovascularization is not present.   Notes No active retinal vasculitis.  Bilateral RPE window defects posterior pole:  Coinciding with areas of RPE degeneration and bone spicule pigmentation seen clinically and on color fundus photography. This does suggest a form of retinitis pigmentosa.                  ASSESSMENT/PLAN:  Cystoid macular edema of left eye Micro CME, yet no overt CME seen angiographically.  Cystoid macular edema of right eye Micro CME also on OCT not seen and visualized on angiography  Retinitis pigmentosa, both eyes Bilateral retinal degeneration yet with no visual field deficit.  There is RPE changes.  The patient has no clinical relation to his daughter who has defined retinitis pigmentosa but this is likely on the basis of autosomal recessive generation because the patient's grandmother has the disorder retinitis pigmentosa, yet the mother of the daughter does not.  The mother reports to me personally that the family has Amish and Mennonite background.  This does suggest the potential for close relationship parental heritage and some generations prior.   The daughter has defined retinitis pigmentosa thus this can only be autosomal recessive.  This patient with RPE degeneration may have some form of retinal degeneration and may also have 2 incidentally present.  Nonetheless no visual field deficits.  Observe only      ICD-10-CM   1. Cystoid macular edema of left eye  H35.352 Color Fundus Photography Optos - OU - Both Eyes    Fluorescein Angiography Optos (Transit OS)    Fluorescein Sodium 10 % injection 500 mg  2. Cystoid macular edema of right eye  H35.351 Color Fundus Photography Optos - OU - Both Eyes    Fluorescein Angiography Optos (Transit OS)    Fluorescein Sodium 10 % injection 500 mg  3. Retinitis pigmentosa, both eyes  H35.52     1.  RPE degeneration this could be disciform scarring of Stargardt's without macular involvement or this could be retinitis pigmentosa forme frust, without clinical pathological manifestations other than RPE window defects on fluorescein  angiography.  Micro CME is of no "consequence at this point seen on OCT only.  2.  I suggest observation alone  3.  Ophthalmic Meds Ordered this visit:  Meds ordered this encounter  Medications  . Fluorescein Sodium 10 % injection 500 mg       Return in about 1 year (around 02/16/2022) for DILATE OU, COLOR FP, OCT.  There are no Patient Instructions on file for this visit.   Explained the diagnoses, plan, and follow up with the patient and they expressed understanding.  Patient expressed understanding of the importance of proper follow up care.   Dominica Severin  Dorina Hoyer M.D. Diseases & Surgery of the Retina and Vitreous [facility] [place] 02/16/21     Abbreviations: M myopia (nearsighted); A astigmatism; H hyperopia (farsighted); P presbyopia; Mrx spectacle prescription;  CTL contact lenses; OD right eye; OS left eye; OU both eyes  XT exotropia; ET esotropia; PEK punctate epithelial keratitis; PEE punctate epithelial erosions; DES dry eye syndrome; MGD meibomian gland dysfunction; ATs artificial tears; PFAT's preservative free artificial tears; [place] nuclear sclerotic cataract; PSC posterior subcapsular cataract; ERM epi-retinal membrane; PVD posterior vitreous detachment; RD retinal detachment; DM diabetes mellitus; DR diabetic retinopathy; NPDR non-proliferative diabetic retinopathy; PDR proliferative diabetic retinopathy; CSME clinically significant macular edema; DME diabetic macular edema; dbh dot blot hemorrhages; CWS cotton wool spot; POAG primary open angle glaucoma; C/D cup-to-disc ratio; HVF humphrey visual field; GVF goldmann visual field; OCT optical coherence tomography; IOP intraocular pressure; BRVO Branch retinal vein occlusion; CRVO central retinal vein occlusion; CRAO central retinal artery occlusion; BRAO branch retinal artery occlusion; RT retinal tear; SB scleral buckle; PPV pars plana vitrectomy; VH Vitreous hemorrhage; PRP panretinal laser photocoagulation; IVK  intravitreal kenalog; VMT vitreomacular traction; MH Macular hole;  NVD neovascularization of the disc; NVE neovascularization elsewhere; AREDS age related eye disease study; ARMD age related macular degeneration; POAG primary open angle glaucoma; EBMD epithelial/anterior basement membrane dystrophy; ACIOL anterior chamber intraocular lens; IOL intraocular lens; PCIOL posterior chamber intraocular lens; Phaco/IOL phacoemulsification with intraocular lens placement; [place] photorefractive keratectomy; LASIK laser assisted in situ keratomileusis; HTN hypertension; DM diabetes mellitus; COPD chronic obstructive pulmonary disease

## 2021-02-16 NOTE — Assessment & Plan Note (Signed)
Micro CME, yet no overt CME seen angiographically.

## 2021-02-16 NOTE — Assessment & Plan Note (Signed)
Bilateral retinal degeneration yet with no visual field deficit.  There is RPE changes.  The patient has no clinical relation to his daughter who has defined retinitis pigmentosa but this is likely on the basis of autosomal recessive generation because the patient's grandmother has the disorder retinitis pigmentosa, yet the mother of the daughter does not.  The mother reports to me personally that the family has Amish and Mennonite background.  This does suggest the potential for close relationship parental heritage and some generations prior.   The daughter has defined retinitis pigmentosa thus this can only be autosomal recessive.  This patient with RPE degeneration may have some form of retinal degeneration and may also have 2 incidentally present.  Nonetheless no visual field deficits.  Observe only

## 2021-02-16 NOTE — Assessment & Plan Note (Signed)
Micro CME also on OCT not seen and visualized on angiography

## 2021-02-20 DIAGNOSIS — E119 Type 2 diabetes mellitus without complications: Secondary | ICD-10-CM | POA: Diagnosis not present

## 2021-02-20 DIAGNOSIS — Z736 Limitation of activities due to disability: Secondary | ICD-10-CM | POA: Diagnosis not present

## 2021-02-20 DIAGNOSIS — K089 Disorder of teeth and supporting structures, unspecified: Secondary | ICD-10-CM | POA: Diagnosis not present

## 2021-03-31 ENCOUNTER — Other Ambulatory Visit: Payer: Self-pay

## 2021-03-31 ENCOUNTER — Ambulatory Visit (INDEPENDENT_AMBULATORY_CARE_PROVIDER_SITE_OTHER): Payer: Medicare Other | Admitting: Family

## 2021-03-31 VITALS — BP 94/63 | HR 63 | Temp 98.2°F | Resp 18 | Wt 191.6 lb

## 2021-03-31 DIAGNOSIS — E785 Hyperlipidemia, unspecified: Secondary | ICD-10-CM | POA: Diagnosis not present

## 2021-03-31 DIAGNOSIS — F418 Other specified anxiety disorders: Secondary | ICD-10-CM

## 2021-03-31 DIAGNOSIS — L84 Corns and callosities: Secondary | ICD-10-CM | POA: Diagnosis not present

## 2021-03-31 DIAGNOSIS — I10 Essential (primary) hypertension: Secondary | ICD-10-CM | POA: Diagnosis not present

## 2021-03-31 DIAGNOSIS — G4700 Insomnia, unspecified: Secondary | ICD-10-CM | POA: Diagnosis not present

## 2021-03-31 DIAGNOSIS — E1142 Type 2 diabetes mellitus with diabetic polyneuropathy: Secondary | ICD-10-CM | POA: Diagnosis not present

## 2021-03-31 LAB — COMPREHENSIVE METABOLIC PANEL
ALT: 22 U/L (ref 0–53)
AST: 20 U/L (ref 0–37)
Albumin: 4.6 g/dL (ref 3.5–5.2)
Alkaline Phosphatase: 92 U/L (ref 39–117)
BUN: 15 mg/dL (ref 6–23)
CO2: 32 mEq/L (ref 19–32)
Calcium: 9.3 mg/dL (ref 8.4–10.5)
Chloride: 100 mEq/L (ref 96–112)
Creatinine, Ser: 0.89 mg/dL (ref 0.40–1.50)
GFR: 100.01 mL/min (ref >60.00)
Glucose, Bld: 138 mg/dL — ABNORMAL HIGH (ref 70–99)
Potassium: 4.4 mEq/L (ref 3.5–5.1)
Sodium: 136 mEq/L (ref 135–145)
Total Bilirubin: 0.8 mg/dL (ref 0.2–1.2)
Total Protein: 7.3 g/dL (ref 6.0–8.3)

## 2021-03-31 LAB — LIPID PANEL
Cholesterol: 119 mg/dL (ref 0–200)
HDL: 41.8 mg/dL (ref >39.00)
LDL Cholesterol: 51 mg/dL (ref 0–99)
NonHDL: 77.24
Total CHOL/HDL Ratio: 3
Triglycerides: 129 mg/dL (ref 0.0–149.0)
VLDL: 25.8 mg/dL (ref 0.0–40.0)

## 2021-03-31 LAB — HEMOGLOBIN A1C: Hgb A1c MFr Bld: 6.1 % (ref 4.6–6.5)

## 2021-03-31 MED ORDER — LISINOPRIL 10 MG PO TABS: 10.0000 mg | ORAL_TABLET | Freq: Every day | ORAL | 1 refills | Status: DC

## 2021-03-31 MED ORDER — METOPROLOL TARTRATE 50 MG PO TABS: 50.0000 mg | ORAL_TABLET | Freq: Two times a day (BID) | ORAL | 1 refills | Status: DC

## 2021-03-31 MED ORDER — ZOSTER VAC RECOMB ADJUVANTED 50 MCG/0.5ML IM SUSR: INTRAMUSCULAR | 1 refills | Status: DC

## 2021-03-31 MED ORDER — CLONAZEPAM 1 MG PO TABS: ORAL_TABLET | ORAL | 2 refills | Status: DC

## 2021-03-31 NOTE — Progress Notes (Signed)
Subjective:   By signing my name below, I, Shehryar Baig, attest that this documentation has been prepared under the direction and in the presence of [name] NP. 03/31/2021   Patient ID: Cody Raymond, male    DOB: 1970-10-26, 50 y.o.   MRN: [id number]  Chief Complaint  Patient presents with   Anxiety    Pt has had some life changes in the family.  He is taking: Ambien 10 mg nightly, Duloxetine 30 mg,  Pt says he is having issues with getting his Clonazepam 1 mg filled- he is taking 1/2 at lunchtime and 1 t tab nightly. He needs the Rx written like this, and Trazodone 100 mg.    Spot on toe    On the Left foot, 2nd toe, feels like a lump x 2 weeks. Pt is concerned because he has DM     HPI Patient is in today for office visit Patient reports that he is experiencing anxiety. He has elevated stress due to his family life. His mother was recently diagnosed with a terminal illness. His daughters home situation also cause him extra stress.   Sore on toe- He complains of a painful sore on his left foot.  Klonopin- He is requesting a 90 day refill for 1.5x 1 mg klonopin daily PO. He reports taking half a tablet has helped him calm down. He also manages his stress by staying at [employer].  He also complains of a sore on his toe. He has a history of diabetes. He is requesting a refill on 1.5x 1 mg klonopin daily PO. Insomnia- He continues having insomnia and thinks that that it is due to stress. His reports that his mothers situation is what is keeping him up at night.  Blood pressure- He takes 3x 50 mg metoprolol tartrate daily PO. He was recently taken to the ER and had elevated BP and heart rate. His heart rate and blood pressure decreased after he had taken his klonopin. He is requesting a refill on 50 mg metoprolol tartrate daily PO and 5 mg lisinopril daily PO.  BP Readings from Last 3 Encounters:  03/31/21 94/63  09/29/20 119/71  06/01/20 98/60    Health Maintenance  Due  Topic Date Due   COLONOSCOPY (Pts 45-30yr Insurance coverage will need to be confirmed)  Never done   OPHTHALMOLOGY EXAM  08/23/2016   FOOT EXAM  04/21/2019   COVID-19 Vaccine (2 - Pfizer series) 09/08/2020   Zoster Vaccines- Shingrix (1 of 2) Never done   HEMOGLOBIN A1C  11/16/2020    Past Medical History:  Diagnosis Date   [place]    Asthma    Blood transfusion    CAD (coronary artery disease), non obstructive on cath 2011 04/05/2012   Coronary artery disease    Diabetes mellitus    Diabetes mellitus without complication ([place]    Enlarged prostate    GERD (gastroesophageal reflux disease)    H/O syncope    Heart disease    History of repair of patent ductus arteriosus 07/23/2016   Hyperlipidemia    Hypertension    [place]    DJD   Refusal of blood transfusions as patient is Jehovah's Witness     Past Surgical History:  Procedure Laterality Date   APPENDECTOMY     BACK SURGERY     CARDIAC CATHETERIZATION  2007   [place] with RCA 30% narrowing likely due to catheter induced spasm in 09/02/10.  CARDIAC CATHETERIZATION  09/02/2010   mod. nonobstructive disease in the RCA and CX, tortuous LAD   COLONOSCOPY W/ POLYPECTOMY  03/17/11   diminutive polyp   LOOP RECORDER EXPLANT N/A 01/14/2014   Procedure: LOOP RECORDER EXPLANT;  Surgeon: Sanda Klein, MD;  Location: [facility];  Service: Cardiovascular;  Laterality: N/A;   LOOP RECORDER IMPLANT  04/06/2012   Reveal XT 4529   LOOP RECORDER IMPLANT N/A 04/06/2012   Procedure: LOOP RECORDER IMPLANT;  Surgeon: Sanda Klein, MD;  Location: [facility];  Service: Cardiovascular;  Laterality: N/A;   NM MYOCAR PERF WALL MOTION  01/25/2008   mild anteroapical wall ischemia   PATENT DUCTUS ARTERIOUS REPAIR     at age 89    Family History  Problem Relation Age of Onset   Colon cancer Paternal Grandfather    Prostate cancer Paternal Grandfather    Aneurysm Father     Heart attack Father 49   Coronary artery disease Father    Colon polyps Mother    Heart disease Mother    Coronary artery disease Mother    Migraines Mother    Breast cancer Maternal Grandmother    Colon cancer Paternal Uncle        x 2   Stomach cancer Brother    Liver disease Other        unsure who it was   Allergies Daughter     Social History   Socioeconomic History   Marital status: Married    Spouse name: Not on file   Number of children: Not on file   Years of education: Not on file   Highest education level: Not on file  Occupational History   Not on file  Tobacco Use   Smoking status: Never   Smokeless tobacco: Never  Vaping Use   Vaping Use: Never used  Substance and Sexual Activity   Alcohol use: No   Drug use: No   Sexual activity: Yes  Other Topics Concern   Not on file  Social History Narrative   ** Merged History Encounter **       Holter monitor 08/2010: PVCs and sinus tachy.   Sleep Study (02/2008): mild sleep apnea, no indication for CPAP.   Social Determinants of Health   Financial Resource Strain: Not on file  Food Insecurity: Not on file  Transportation Needs: Not on file  Physical Activity: Not on file  Stress: Not on file  Social Connections: Not on file  Intimate Partner Violence: Not on file    Outpatient Medications Prior to Visit  Medication Sig Dispense Refill   albuterol (PROVENTIL) (2.5 MG/3ML) 0.083% nebulizer solution Take 3 mLs (2.5 mg total) by nebulization every 6 (six) hours as needed for wheezing or shortness of breath. 1125 mL 3   Ascorbic Acid (VITAMIN C) 1000 MG tablet Take 1,000 mg by mouth daily.     aspirin EC 81 MG EC tablet Take 1 tablet (81 mg total) by mouth daily. 30 tablet 0   atorvastatin (LIPITOR) 80 MG tablet Take 1 tablet (80 mg total) by mouth daily. 30 tablet 0   betamethasone dipropionate (DIPROLENE) 0.05 % cream Apply topically 2 (two) times daily. 30 g 0   cetirizine (ZYRTEC) 10 MG tablet Take 10 mg  by mouth daily.     DULoxetine (CYMBALTA) 30 MG capsule Take 1 capsule (30 mg total) by mouth daily. 90 capsule 1   ezetimibe (ZETIA) 10 MG tablet Take 1 tablet (10 mg total) by  mouth daily. 30 tablet 0   fluticasone (FLONASE) 50 MCG/ACT nasal spray Place 2 sprays into both nostrils daily. 9.9 g 0   gabapentin (NEURONTIN) 300 MG capsule TAKE 1 CAPSULE BY MOUTH AT  6 AM, 11:30 AM AND 4:30 PM  AND TAKE 3 CAPSULES BY  MOUTH AT 10:00 PM 540 capsule 3   Icosapent Ethyl (VASCEPA) 1 g CAPS Take 2 capsules (2 g total) by mouth 2 (two) times daily. 120 capsule 0   isosorbide mononitrate (IMDUR) 30 MG 24 hr tablet TAKE 1/2 TABLET BY MOUTH DAILY 45 tablet 1   ketoconazole (NIZORAL) 2 % shampoo Apply topically 2 (two) times a week. 360 mL 1   meloxicam (MOBIC) 7.5 MG tablet Take 1 tablet (7.5 mg total) by mouth daily as needed for pain. 90 tablet 3   metFORMIN (GLUCOPHAGE-XR) 500 MG 24 hr tablet TAKE 2 TABLETS BY MOUTH  DAILY WITH BREAKFAST 180 tablet 3   montelukast (SINGULAIR) 10 MG tablet Take 1 tablet (10 mg total) by mouth at bedtime. 30 tablet 0   Multiple Vitamin (MULTIVITAMIN WITH MINERALS) TABS tablet Take 1 tablet by mouth daily.     nitroGLYCERIN (NITROSTAT) 0.4 MG SL tablet Place 1 tablet (0.4 mg total) under the tongue every 5 (five) minutes as needed for chest pain. 25 tablet PRN   ONETOUCH VERIO test strip USE WITH METER TO CHECK  BLOOD SUGAR 3 TIMES DAILY 300 strip 12   pantoprazole (PROTONIX) 40 MG tablet Take 1 tablet (40 mg total) by mouth daily. 30 tablet 0   predniSONE (DELTASONE) 10 MG tablet 4 tabs by mouth once daily for 2 days, then 3 tabs daily x 2 days, then 2 tabs daily x 2 days, then 1 tab daily x 2 days 20 tablet 0   sertraline (ZOLOFT) 50 MG tablet Take 50 mg by mouth daily.     silver sulfADIAZINE (SILVADENE) 1 % cream Apply 1 application topically daily. 50 g 0   tamsulosin (FLOMAX) 0.4 MG CAPS capsule Take 1 capsule (0.4 mg total) by mouth daily. 90 capsule 1   tiZANidine  (ZANAFLEX) 2 MG tablet TAKE 1 TABLET BY MOUTH  EVERY 6 HOURS AS NEEDED FOR MUSCLE SPASM(S) 270 tablet 0   traMADol (ULTRAM) 50 MG tablet Take 1 tablet (50 mg total) by mouth 2 (two) times daily. 60 tablet 5   traZODone (DESYREL) 100 MG tablet TAKE 1 TABLET BY MOUTH AT  BEDTIME 90 tablet 0   zolpidem (AMBIEN) 10 MG tablet TAKE 1 TABLET BY MOUTH AT  BEDTIME AS NEEDED FOR SLEEP 90 tablet 0   azithromycin (ZITHROMAX) 250 MG tablet Take 2 tabs by mouth today, then one tab once daily for 4 more days. 6 tablet 0   clonazePAM (KLONOPIN) 1 MG tablet Take 1/2 tab PO in the AM and 1 tab PO in the PM 45 tablet 2   COVID-19 mRNA vaccine, Pfizer, 30 MCG/0.3ML injection INJECT AS DIRECTED .3 mL 0   diclofenac sodium (VOLTAREN) 1 % GEL Apply 2 g topically 4 (four) times daily as needed (pain). 720 g 0   lisinopril (ZESTRIL) 10 MG tablet Take 1 tablet (10 mg total) by mouth daily. 90 tablet 1   lisinopril (ZESTRIL) 5 MG tablet Take 5 mg by mouth daily.     metoprolol tartrate (LOPRESSOR) 25 MG tablet Take by mouth.     metoprolol tartrate (LOPRESSOR) 50 MG tablet TAKE 1 TABLET BY MOUTH  TWICE DAILY 180 tablet 3   ondansetron (ZOFRAN)  4 MG tablet Take 1 tablet (4 mg total) by mouth every 8 (eight) hours as needed for nausea or vomiting. 12 tablet 0   No facility-administered medications prior to visit.    Allergies  Allergen Reactions   Benadryl [Diphenhydramine Hcl]     "drives me nuts"    Review of Systems  Skin:        (+)Sore on left foot  Psychiatric/Behavioral:  The patient is nervous/anxious and has insomnia.       Objective:    Physical Exam Constitutional:      General: He is not in acute distress.    Appearance: Normal appearance. He is not ill-appearing.  HENT:     Head: Normocephalic and atraumatic.     Right Ear: External ear normal.     Left Ear: External ear normal.  Eyes:     Extraocular Movements: Extraocular movements intact.     Pupils: Pupils are equal, round, and reactive  to light.  Cardiovascular:     Rate and Rhythm: Normal rate and regular rhythm.     Pulses: Normal pulses.     Heart sounds: Normal heart sounds. No murmur heard.   No gallop.  Pulmonary:     Effort: Pulmonary effort is normal. No respiratory distress.     Breath sounds: Normal breath sounds. No wheezing, rhonchi or rales.  Skin:    General: Skin is warm and dry.     Comments: Corn on the left second toe medially.  Neurological:     Mental Status: He is alert and oriented to person, place, and time.  Psychiatric:        Behavior: Behavior normal.        Judgment: Judgment normal.    BP 94/63 (BP Location: Left Arm, Patient Position: Sitting, Cuff Size: Normal)   Pulse 63   Temp 98.2 F (36.8 C) (Oral)   Resp 18   Wt 191 lb 9.6 oz (86.9 kg)   SpO2 97%   BMI 29.13 kg/m  Wt Readings from Last 3 Encounters:  03/31/21 191 lb 9.6 oz (86.9 kg)  09/29/20 175 lb (79.4 kg)  06/11/20 185 lb (83.9 kg)       Assessment & Plan:   Problem List Items Addressed This Visit       Unprioritized   Type 2 diabetes, controlled, with peripheral neuropathy (HCC)    Clinically stable on metformin xr 1026m once daily.        Relevant Medications   clonazePAM (KLONOPIN) 1 MG tablet   lisinopril (ZESTRIL) 10 MG tablet   Other Relevant Orders   Hemoglobin A1c   Comp Met (CMET)   Insomnia - Primary    Stable on clonazepam 113mand Ambien 1020mrn. Continue same. Obtain UDS/contract updated today.        Relevant Orders   DRUG MONITORING, PANEL 8 WITH CONFIRMATION, URINE   Essential hypertension    BP Readings from Last 3 Encounters:  03/31/21 94/63  09/29/20 119/71  06/01/20 98/60  BP is stable. Continues beta blocker (metoprolol 75m46md).        Relevant Medications   lisinopril (ZESTRIL) 10 MG tablet   metoprolol tartrate (LOPRESSOR) 50 MG tablet   Depression with anxiety    Continues to have improvement with prn klonopin  Continue same. Obtain UDS/contract updated today.  Mood is stressed due to family stressors, but otherwise depression is stable.        Corn of toe    Recommended corn  pad.        Other Visit Diagnoses     Hyperlipidemia, unspecified hyperlipidemia type       Relevant Medications   lisinopril (ZESTRIL) 10 MG tablet   metoprolol tartrate (LOPRESSOR) 50 MG tablet   Other Relevant Orders   Lipid panel        Meds ordered this encounter  Medications   clonazePAM (KLONOPIN) 1 MG tablet    Sig: Take 1/2 tab PO in the AM and 1 tab PO in the PM    Dispense:  45 tablet    Refill:  2    Order Specific Question:   Supervising Provider    Answer:   Penni Homans A [[id number]]   lisinopril (ZESTRIL) 10 MG tablet    Sig: Take 1 tablet (10 mg total) by mouth daily.    Dispense:  90 tablet    Refill:  1    Order Specific Question:   Supervising Provider    Answer:   Penni Homans A [[id number]]   metoprolol tartrate (LOPRESSOR) 50 MG tablet    Sig: Take 1 tablet (50 mg total) by mouth 2 (two) times daily.    Dispense:  180 tablet    Refill:  1    Requesting 1 year supply    Order Specific Question:   Supervising Provider    Answer:   Penni Homans A [[id number]]   Zoster Vaccine Adjuvanted [facility]) injection    Sig: Inject 0.61m IM now and repeat in 2-6 months.    Dispense:  0.5 mL    Refill:  1    Order Specific Question:   Supervising Provider    Answer:   BPenni HomansA [[id number]]    I, MDebbrah AlarNP,personally preformed the services described in this documentation.  All medical record entries made by the scribe were at my direction and in my presence.  I have reviewed the chart and discharge instructions (if applicable) and agree that the record reflects my personal performance and is accurate and complete. 03/31/2021   I,Shehryar Baig,acting as a sEducation administratorfor MNance Pear NP.,have documented all relevant documentation on the behalf of MNance Pear NP,as directed by  MNance Pear NP while in the presence of  MNance Pear NP.      MNance Pear NP

## 2021-03-31 NOTE — Assessment & Plan Note (Signed)
BP Readings from Last 3 Encounters:  03/31/21 94/63  09/29/20 119/71  06/01/20 98/60   BP is stable. Continues beta blocker (metoprolol 50mg  bid).

## 2021-03-31 NOTE — Assessment & Plan Note (Signed)
Continues to have improvement with prn klonopin  Continue same. Obtain UDS/contract updated today. Mood is stressed due to family stressors, but otherwise depression is stable.

## 2021-03-31 NOTE — Assessment & Plan Note (Signed)
Clinically stable on metformin xr 1000mg  once daily.

## 2021-03-31 NOTE — Assessment & Plan Note (Signed)
Recommended corn pad.

## 2021-03-31 NOTE — Assessment & Plan Note (Signed)
Stable on clonazepam 1mg  and Ambien 10mg  prn. Continue same. Obtain UDS/contract updated today.

## 2021-04-04 ENCOUNTER — Encounter: Payer: Self-pay | Admitting: Family

## 2021-04-05 DIAGNOSIS — J019 Acute sinusitis, unspecified: Secondary | ICD-10-CM | POA: Diagnosis not present

## 2021-04-05 DIAGNOSIS — J209 Acute bronchitis, unspecified: Secondary | ICD-10-CM | POA: Diagnosis not present

## 2021-04-06 ENCOUNTER — Telehealth (INDEPENDENT_AMBULATORY_CARE_PROVIDER_SITE_OTHER): Payer: Medicare Other | Admitting: Family

## 2021-04-06 ENCOUNTER — Other Ambulatory Visit: Payer: Self-pay

## 2021-04-06 VITALS — BP 106/50 | HR 76 | Temp 98.7°F

## 2021-04-06 DIAGNOSIS — J209 Acute bronchitis, unspecified: Secondary | ICD-10-CM

## 2021-04-06 MED ORDER — AZITHROMYCIN 250 MG PO TABS: ORAL_TABLET | ORAL | 0 refills | Status: AC

## 2021-04-06 NOTE — Progress Notes (Signed)
MyChart Video Visit    Virtual Visit via Video Note   This visit type was conducted due to national recommendations for restrictions regarding the COVID-19 Pandemic (e.g. social distancing) in an effort to limit this patient's exposure and mitigate transmission in our community. This patient is at least at moderate risk for complications without adequate follow up. This format is felt to be most appropriate for this patient at this time. Physical exam was limited by quality of the video and audio technology used for the visit. CMA was able to get the patient set up on a video visit.  Patient location: Home. Patient and provider in visit Provider location: Office  I discussed the limitations of evaluation and management by telemedicine and the availability of in person appointments. The patient expressed understanding and agreed to proceed.  Visit Date: 04/06/2021  Today's healthcare provider: Nance Pear, NP     Subjective:    Patient ID: Cody Raymond, male    DOB: 1970-11-30, 50 y.o.   MRN: [id number]  Chief Complaint  Patient presents with   Sinusitis    Congestion , runny nose , postnasal drainage , cough     HPI  Patient is a 50 yr old male who presents today with c/o nasal congestion/runny nose, cough, worsening asthma symptoms. Symptoms began on the 16th. Symptoms worsened overnight and he called "[facility]  He was prescribed  prednisone 50mg  daily for 5 days. He took a home covid test which was negative.  Notes yellow phlegm.  Notes cough is "like a bronchitis cough." He is using albuterol mdi and neb. Notes that he is breathing better today since beginning prednisone yesterday.  Past Medical History:  Diagnosis Date   Aneurysm ([place])    Asthma    Blood transfusion    CAD (coronary artery disease), non obstructive on cath 2011 04/05/2012   Coronary artery disease    Diabetes mellitus    Diabetes mellitus without complication ([place])    Enlarged  prostate    GERD (gastroesophageal reflux disease)    H/O syncope    Heart disease    History of repair of patent ductus arteriosus 07/23/2016   Hyperlipidemia    Hypertension    Neuromuscular disorder ([place])    DJD   Refusal of blood transfusions as patient is Jehovah's Witness     Past Surgical History:  Procedure Laterality Date   APPENDECTOMY     BACK SURGERY     CARDIAC CATHETERIZATION  2007   Clean Cardiac Cath ([place]), with RCA 30% narrowing likely due to catheter induced spasm in 09/02/10.   CARDIAC CATHETERIZATION  09/02/2010   mod. nonobstructive disease in the RCA and CX, tortuous LAD   COLONOSCOPY W/ POLYPECTOMY  03/17/11   diminutive polyp   LOOP RECORDER EXPLANT N/A 01/14/2014   Procedure: LOOP RECORDER EXPLANT;  Surgeon: Sanda Klein, MD;  Location: [facility] CATH [facility];  Service: Cardiovascular;  Laterality: N/A;   LOOP RECORDER IMPLANT  04/06/2012   Reveal XT 4529   LOOP RECORDER IMPLANT N/A 04/06/2012   Procedure: LOOP RECORDER IMPLANT;  Surgeon: Sanda Klein, MD;  Location: [facility] CATH [facility];  Service: Cardiovascular;  Laterality: N/A;   NM MYOCAR PERF WALL MOTION  01/25/2008   mild anteroapical wall ischemia   PATENT DUCTUS ARTERIOUS REPAIR     at age 11    Family History  Problem Relation Age of Onset   Colon cancer Paternal Grandfather    Prostate cancer Paternal Grandfather  Aneurysm Father    Heart attack Father 26   Coronary artery disease Father    Colon polyps Mother    Heart disease Mother    Coronary artery disease Mother    Migraines Mother    Breast cancer Maternal Grandmother    Colon cancer Paternal Uncle        x 2   Stomach cancer Brother    Liver disease Other        unsure who it was   Allergies Daughter     Social History   Socioeconomic History   Marital status: Married    Spouse name: Not on file   Number of children: Not on file   Years of education: Not on file   Highest education level: Not on file  Occupational  History   Not on file  Tobacco Use   Smoking status: Never   Smokeless tobacco: Never  Vaping Use   Vaping Use: Never used  Substance and Sexual Activity   Alcohol use: No   Drug use: No   Sexual activity: Yes  Other Topics Concern   Not on file  Social History Narrative   ** Merged History Encounter **       Holter monitor 08/2010: PVCs and sinus tachy.   Sleep Study (02/2008): mild sleep apnea, no indication for CPAP.   Social Determinants of Health   Financial Resource Strain: Not on file  Food Insecurity: Not on file  Transportation Needs: Not on file  Physical Activity: Not on file  Stress: Not on file  Social Connections: Not on file  Intimate Partner Violence: Not on file    Outpatient Medications Prior to Visit  Medication Sig Dispense Refill   albuterol (PROVENTIL) (2.5 MG/3ML) 0.083% nebulizer solution Take 3 mLs (2.5 mg total) by nebulization every 6 (six) hours as needed for wheezing or shortness of breath. 1125 mL 3   Ascorbic Acid (VITAMIN C) 1000 MG tablet Take 1,000 mg by mouth daily.     aspirin EC 81 MG EC tablet Take 1 tablet (81 mg total) by mouth daily. 30 tablet 0   atorvastatin (LIPITOR) 80 MG tablet Take 1 tablet (80 mg total) by mouth daily. 30 tablet 0   betamethasone dipropionate (DIPROLENE) 0.05 % cream Apply topically 2 (two) times daily. 30 g 0   cetirizine (ZYRTEC) 10 MG tablet Take 10 mg by mouth daily.     clonazePAM (KLONOPIN) 1 MG tablet Take 1/2 tab PO in the AM and 1 tab PO in the PM 45 tablet 2   DULoxetine (CYMBALTA) 30 MG capsule Take 1 capsule (30 mg total) by mouth daily. 90 capsule 1   ezetimibe (ZETIA) 10 MG tablet Take 1 tablet (10 mg total) by mouth daily. 30 tablet 0   fluticasone (FLONASE) 50 MCG/ACT nasal spray Place 2 sprays into both nostrils daily. 9.9 g 0   gabapentin (NEURONTIN) 300 MG capsule TAKE 1 CAPSULE BY MOUTH AT  6 AM, 11:30 AM AND 4:30 PM  AND TAKE 3 CAPSULES BY  MOUTH AT 10:00 PM 540 capsule 3   Icosapent Ethyl  (VASCEPA) 1 g CAPS Take 2 capsules (2 g total) by mouth 2 (two) times daily. 120 capsule 0   isosorbide mononitrate (IMDUR) 30 MG 24 hr tablet TAKE 1/2 TABLET BY MOUTH DAILY 45 tablet 1   ketoconazole (NIZORAL) 2 % shampoo Apply topically 2 (two) times a week. 360 mL 1   lisinopril (ZESTRIL) 10 MG tablet Take 1 tablet (10  mg total) by mouth daily. 90 tablet 1   meloxicam (MOBIC) 7.5 MG tablet Take 1 tablet (7.5 mg total) by mouth daily as needed for pain. 90 tablet 3   metFORMIN (GLUCOPHAGE-XR) 500 MG 24 hr tablet TAKE 2 TABLETS BY MOUTH  DAILY WITH BREAKFAST 180 tablet 3   metoprolol tartrate (LOPRESSOR) 50 MG tablet Take 1 tablet (50 mg total) by mouth 2 (two) times daily. 180 tablet 1   montelukast (SINGULAIR) 10 MG tablet Take 1 tablet (10 mg total) by mouth at bedtime. 30 tablet 0   Multiple Vitamin (MULTIVITAMIN WITH MINERALS) TABS tablet Take 1 tablet by mouth daily.     nitroGLYCERIN (NITROSTAT) 0.4 MG SL tablet Place 1 tablet (0.4 mg total) under the tongue every 5 (five) minutes as needed for chest pain. 25 tablet PRN   ONETOUCH VERIO test strip USE WITH METER TO CHECK  BLOOD SUGAR 3 TIMES DAILY 300 strip 12   pantoprazole (PROTONIX) 40 MG tablet Take 1 tablet (40 mg total) by mouth daily. 30 tablet 0   predniSONE (DELTASONE) 10 MG tablet 4 tabs by mouth once daily for 2 days, then 3 tabs daily x 2 days, then 2 tabs daily x 2 days, then 1 tab daily x 2 days 20 tablet 0   sertraline (ZOLOFT) 50 MG tablet Take 50 mg by mouth daily.     silver sulfADIAZINE (SILVADENE) 1 % cream Apply 1 application topically daily. 50 g 0   tamsulosin (FLOMAX) 0.4 MG CAPS capsule Take 1 capsule (0.4 mg total) by mouth daily. 90 capsule 1   tiZANidine (ZANAFLEX) 2 MG tablet TAKE 1 TABLET BY MOUTH  EVERY 6 HOURS AS NEEDED FOR MUSCLE SPASM(S) 270 tablet 0   traMADol (ULTRAM) 50 MG tablet Take 1 tablet (50 mg total) by mouth 2 (two) times daily. 60 tablet 5   traZODone (DESYREL) 100 MG tablet TAKE 1 TABLET BY  MOUTH AT  BEDTIME 90 tablet 0   zolpidem (AMBIEN) 10 MG tablet TAKE 1 TABLET BY MOUTH AT  BEDTIME AS NEEDED FOR SLEEP 90 tablet 0   Zoster Vaccine Adjuvanted [facility]) injection Inject 0.5mg  IM now and repeat in 2-6 months. 0.5 mL 1   No facility-administered medications prior to visit.    Allergies  Allergen Reactions   Benadryl [Diphenhydramine Hcl]     "drives me nuts"    ROS    See HPI  Objective:    Physical Exam  BP (!) 106/50   Pulse 76   Temp 98.7 F (37.1 C)  Wt Readings from Last 3 Encounters:  03/31/21 191 lb 9.6 oz (86.9 kg)  09/29/20 175 lb (79.4 kg)  06/11/20 185 lb (83.9 kg)    Gen: Awake, alert, no acute distress.  Appears tired. Resp: Breathing is even and non-labored Psych: calm/pleasant demeanor Neuro: Alert and Oriented x 3, + facial symmetry, speech is clear.     Assessment & Plan:   Problem List Items Addressed This Visit       Unprioritized   Bronchitis with bronchospasm - Primary    Advised pt to complete prednisone burst. Add azithromycin as ordered below, continue albuterol MDI or Neb every 6 hours for the next few days. He is advised to call if symptoms worsen or if symptoms fail to improve in the next few days. Pt verbalizes understanding.         I am having Cody Raymond "Edgemere" start on azithromycin. I am also having him maintain his cetirizine, multivitamin with  minerals, vitamin C, aspirin, icosapent Ethyl, betamethasone dipropionate, silver sulfADIAZINE, fluticasone, isosorbide mononitrate, nitroGLYCERIN, traMADol, OneTouch Verio, sertraline, metFORMIN, gabapentin, tiZANidine, meloxicam, predniSONE, albuterol, zolpidem, tamsulosin, ketoconazole, DULoxetine, atorvastatin, montelukast, pantoprazole, ezetimibe, traZODone, clonazePAM, lisinopril, metoprolol tartrate, and Zoster Vaccine Adjuvanted.  Meds ordered this encounter  Medications   azithromycin (ZITHROMAX) 250 MG tablet    Sig: Take 2 tablets on day 1, then 1 tablet  daily on days 2 through 5    Dispense:  6 tablet    Refill:  0    Order Specific Question:   Supervising Provider    Answer:   Penni Homans A [[id number]]    I discussed the assessment and treatment plan with the patient. The patient was provided an opportunity to ask questions and all were answered. The patient agreed with the plan and demonstrated an understanding of the instructions.   The patient was advised to call back or seek an in-person evaluation if the symptoms worsen or if the condition fails to improve as anticipated.  Nance Pear, NP [employer] [facility] [employer] [phone] (phone) [phone] (fax)  [place]

## 2021-04-06 NOTE — Assessment & Plan Note (Signed)
Advised pt to complete prednisone burst. Add azithromycin as ordered below, continue albuterol MDI or Neb every 6 hours for the next few days. He is advised to call if symptoms worsen or if symptoms fail to improve in the next few days. Pt verbalizes understanding.

## 2021-04-07 LAB — DM TEMPLATE

## 2021-04-07 LAB — DRUG MONITORING, PANEL 8 WITH CONFIRMATION, URINE
6 Acetylmorphine: NEGATIVE ng/mL (ref <10)
Alcohol Metabolites: NEGATIVE ng/mL (ref <500)
Alphahydroxyalprazolam: NEGATIVE ng/mL (ref <25)
Alphahydroxymidazolam: NEGATIVE ng/mL (ref <50)
Alphahydroxytriazolam: NEGATIVE ng/mL (ref <50)
Aminoclonazepam: 402 ng/mL — ABNORMAL HIGH (ref <25)
Amphetamines: NEGATIVE ng/mL (ref <500)
Benzodiazepines: POSITIVE ng/mL — AB (ref <100)
Buprenorphine, Urine: NEGATIVE ng/mL (ref <5)
Cocaine Metabolite: NEGATIVE ng/mL (ref <150)
Creatinine: 169.5 mg/dL (ref >=20.0)
Hydroxyethylflurazepam: NEGATIVE ng/mL (ref <50)
Lorazepam: NEGATIVE ng/mL (ref <50)
MDMA: NEGATIVE ng/mL (ref <500)
Marijuana Metabolite: NEGATIVE ng/mL (ref <20)
Nordiazepam: NEGATIVE ng/mL (ref <50)
Opiates: NEGATIVE ng/mL (ref <100)
Oxazepam: NEGATIVE ng/mL (ref <50)
Oxidant: NEGATIVE ug/mL (ref <200)
Oxycodone: NEGATIVE ng/mL (ref <100)
Temazepam: NEGATIVE ng/mL (ref <50)
pH: 5.6 (ref 4.5–9.0)

## 2021-04-08 ENCOUNTER — Other Ambulatory Visit: Payer: Self-pay

## 2021-04-08 ENCOUNTER — Encounter (HOSPITAL_COMMUNITY): Payer: Self-pay

## 2021-04-08 ENCOUNTER — Encounter: Payer: Self-pay | Admitting: Family

## 2021-04-08 DIAGNOSIS — U071 COVID-19: Secondary | ICD-10-CM

## 2021-04-08 DIAGNOSIS — Z20822 Contact with and (suspected) exposure to covid-19: Secondary | ICD-10-CM | POA: Diagnosis not present

## 2021-04-08 NOTE — Telephone Encounter (Signed)
Per PCP orders, MAB infusion order entered.

## 2021-04-08 NOTE — Telephone Encounter (Signed)
Spoke with staff at infusion center. They will contact patient for scheduling.

## 2021-04-08 NOTE — Telephone Encounter (Signed)
Hi Cody Raymond, could you please help to arrange monoclonal antibody infusion for this patient? He is on day 6 of symptoms, so tomorrow is his last day of eligibility.

## 2021-04-09 ENCOUNTER — Telehealth: Payer: Self-pay

## 2021-04-11 ENCOUNTER — Other Ambulatory Visit: Payer: Self-pay | Admitting: Family

## 2021-04-18 ENCOUNTER — Other Ambulatory Visit: Payer: Self-pay | Admitting: Family

## 2021-05-02 ENCOUNTER — Encounter: Payer: Self-pay | Admitting: Family

## 2021-05-03 ENCOUNTER — Other Ambulatory Visit: Payer: Self-pay

## 2021-05-03 ENCOUNTER — Other Ambulatory Visit: Payer: Self-pay | Admitting: Family

## 2021-05-03 DIAGNOSIS — E785 Hyperlipidemia, unspecified: Secondary | ICD-10-CM

## 2021-05-03 NOTE — Telephone Encounter (Signed)
Spoke to patient and he reports he needs new prescriptions for this medications [facility].  please review.

## 2021-05-03 NOTE — Progress Notes (Signed)
Patient reports he is no longer taking this medication.

## 2021-05-04 ENCOUNTER — Other Ambulatory Visit: Payer: Self-pay | Admitting: Family

## 2021-05-04 ENCOUNTER — Other Ambulatory Visit: Payer: Self-pay | Admitting: Physical Medicine & Rehabilitation

## 2021-05-04 MED ORDER — PANTOPRAZOLE SODIUM 40 MG PO TBEC: 40.0000 mg | DELAYED_RELEASE_TABLET | Freq: Every day | ORAL | 1 refills | Status: DC

## 2021-05-04 MED ORDER — EZETIMIBE 10 MG PO TABS: 10.0000 mg | ORAL_TABLET | Freq: Every day | ORAL | 1 refills | Status: DC

## 2021-05-04 MED ORDER — TRAZODONE HCL 100 MG PO TABS: 100.0000 mg | ORAL_TABLET | Freq: Every day | ORAL | 0 refills | Status: DC

## 2021-05-04 MED ORDER — DULOXETINE HCL 30 MG PO CPEP: 30.0000 mg | ORAL_CAPSULE | Freq: Every day | ORAL | 1 refills | Status: DC

## 2021-05-04 MED ORDER — MONTELUKAST SODIUM 10 MG PO TABS: 10.0000 mg | ORAL_TABLET | Freq: Every day | ORAL | 0 refills | Status: DC

## 2021-05-04 MED ORDER — ATORVASTATIN CALCIUM 80 MG PO TABS: 80.0000 mg | ORAL_TABLET | Freq: Every day | ORAL | 1 refills | Status: DC

## 2021-05-04 MED ORDER — LISINOPRIL 10 MG PO TABS: 10.0000 mg | ORAL_TABLET | Freq: Every day | ORAL | 1 refills | Status: DC

## 2021-05-04 MED ORDER — METOPROLOL TARTRATE 50 MG PO TABS: 50.0000 mg | ORAL_TABLET | Freq: Two times a day (BID) | ORAL | 1 refills | Status: DC

## 2021-05-04 NOTE — Telephone Encounter (Signed)
I sent everything except the clonazepam which has 2 remaining refills at [facility].

## 2021-05-16 DIAGNOSIS — J309 Allergic rhinitis, unspecified: Secondary | ICD-10-CM | POA: Diagnosis not present

## 2021-05-16 DIAGNOSIS — K047 Periapical abscess without sinus: Secondary | ICD-10-CM | POA: Diagnosis not present

## 2021-05-16 DIAGNOSIS — J4541 Moderate persistent asthma with (acute) exacerbation: Secondary | ICD-10-CM | POA: Diagnosis not present

## 2021-05-21 ENCOUNTER — Telehealth: Payer: Self-pay | Admitting: Family

## 2021-05-21 NOTE — Telephone Encounter (Signed)
Left message for patient to call back and schedule Medicare Annual Wellness Visit (AWV) in office.   If not able to come in office, please offer to do virtually or by telephone.  Left office number and my jabber [phone].  Last AWV:04/20/2016  Please schedule at anytime with Nurse Health Advisor.

## 2021-06-20 ENCOUNTER — Encounter: Payer: Self-pay | Admitting: Family

## 2021-06-21 NOTE — Telephone Encounter (Signed)
Patient scheduled for office visit tomorrow, ok per provider

## 2021-06-22 ENCOUNTER — Other Ambulatory Visit (HOSPITAL_BASED_OUTPATIENT_CLINIC_OR_DEPARTMENT_OTHER): Payer: Self-pay

## 2021-06-22 ENCOUNTER — Ambulatory Visit (HOSPITAL_BASED_OUTPATIENT_CLINIC_OR_DEPARTMENT_OTHER)
Admission: RE | Admit: 2021-06-22 | Discharge: 2021-06-22 | Disposition: A | Discharge status: Home / Self Care | Payer: Medicare Other | Source: Ambulatory Visit | Attending: Family | Admitting: Family

## 2021-06-22 ENCOUNTER — Ambulatory Visit (INDEPENDENT_AMBULATORY_CARE_PROVIDER_SITE_OTHER): Payer: Medicare Other | Admitting: Family

## 2021-06-22 ENCOUNTER — Other Ambulatory Visit: Payer: Self-pay

## 2021-06-22 VITALS — BP 92/53 | HR 79 | Temp 98.5°F | Resp 12 | Ht 68.0 in | Wt 193.8 lb

## 2021-06-22 DIAGNOSIS — R059 Cough, unspecified: Secondary | ICD-10-CM

## 2021-06-22 DIAGNOSIS — R062 Wheezing: Secondary | ICD-10-CM | POA: Diagnosis not present

## 2021-06-22 DIAGNOSIS — I10 Essential (primary) hypertension: Secondary | ICD-10-CM | POA: Diagnosis not present

## 2021-06-22 DIAGNOSIS — R079 Chest pain, unspecified: Secondary | ICD-10-CM | POA: Diagnosis not present

## 2021-06-22 DIAGNOSIS — J209 Acute bronchitis, unspecified: Secondary | ICD-10-CM | POA: Diagnosis not present

## 2021-06-22 MED ORDER — PREDNISONE 10 MG PO TABS
ORAL_TABLET | ORAL | 0 refills | Status: DC
  Filled 2021-06-22: qty 30, 12d supply, fill #0

## 2021-06-22 MED ORDER — AZITHROMYCIN 250 MG PO TABS
ORAL_TABLET | ORAL | 0 refills | Status: AC
Start: 2021-06-22 — End: 2021-06-27
  Filled 2021-06-22: qty 6, 5d supply, fill #0

## 2021-06-22 NOTE — Assessment & Plan Note (Signed)
BP Readings from Last 3 Encounters:  06/22/21 (!) 92/53  04/06/21 (!) 106/50  03/31/21 94/63

## 2021-06-22 NOTE — Assessment & Plan Note (Signed)
New.  Pt is advised as follows:  Please complete x-ray on the first floor. Start azithromycin (Zpak). Begin prednisone taper. Continue albuterol every 6 hours as needed.   Call if symptoms worsen or if symptoms do not improve.

## 2021-06-22 NOTE — Patient Instructions (Signed)
Please complete x-ray on the first floor. Start azithromycin (Zpak). Begin prednisone taper. Continue albuterol every 6 hours as needed.   Call if symptoms worsen or if symptoms do not improve.

## 2021-06-22 NOTE — Progress Notes (Signed)
Subjective:   By signing my name below, I, Lyric Barr-McArthur, attest that this documentation has been prepared under the direction and in the presence of [name], NP, 06/22/2021   Patient ID: Cody Raymond, male    DOB: 28-Jul-1971, 50 y.o.   MRN: [id number]  Chief Complaint  Patient presents with   Cough   Sore Throat   Shortness of Breath         HPI Patient is in today for an office visit.  Sick: His grandson has pneumonia but tested negative for the flu and Covid-19. He was the first to get sick and now everyone in their home is sick. He began experiencing symptoms 4 days ago. His symptoms include drainage, sore throat, sinus pain, left lung pain, and muscle aches. He has used his nebulizer and found no relief with the tightness in his chest. Asthma: He is experiencing chest and airway tightness due to his asthma.  Blood Pressure: His blood pressure recorded low today but not in a concerning range as it always remains fairly low. Immunizations: He is interested in receiving his flu shot and updated Covid-19 vaccine at a later date.     Health Maintenance Due  Topic Date Due   COLONOSCOPY (Pts 45-54yrs Insurance coverage will need to be confirmed)  Never done   OPHTHALMOLOGY EXAM  08/23/2016   FOOT EXAM  04/21/2019   Zoster Vaccines- Shingrix (1 of 2) Never done   COVID-19 Vaccine (4 - Booster for Pfizer series) 12/16/2020   INFLUENZA VACCINE  04/19/2021    Past Medical History:  Diagnosis Date   Aneurysm ([place])    Asthma    Blood transfusion    CAD (coronary artery disease), non obstructive on cath 2011 04/05/2012   Coronary artery disease    Diabetes mellitus    Diabetes mellitus without complication ([place])    Enlarged prostate    GERD (gastroesophageal reflux disease)    H/O syncope    Heart disease    History of repair of patent ductus arteriosus 07/23/2016   Hyperlipidemia    Hypertension    Neuromuscular disorder ([place])    DJD   Refusal of  blood transfusions as patient is Jehovah's Witness     Past Surgical History:  Procedure Laterality Date   APPENDECTOMY     BACK SURGERY     CARDIAC CATHETERIZATION  2007   Clean Cardiac Cath ([place]), with RCA 30% narrowing likely due to catheter induced spasm in 09/02/10.   CARDIAC CATHETERIZATION  09/02/2010   mod. nonobstructive disease in the RCA and CX, tortuous LAD   COLONOSCOPY W/ POLYPECTOMY  03/17/11   diminutive polyp   LOOP RECORDER EXPLANT N/A 01/14/2014   Procedure: LOOP RECORDER EXPLANT;  Surgeon: Sanda Klein, MD;  Location: [facility] CATH LAB;  Service: Cardiovascular;  Laterality: N/A;   LOOP RECORDER IMPLANT  04/06/2012   Reveal XT 4529   LOOP RECORDER IMPLANT N/A 04/06/2012   Procedure: LOOP RECORDER IMPLANT;  Surgeon: Sanda Klein, MD;  Location: [facility] CATH LAB;  Service: Cardiovascular;  Laterality: N/A;   NM MYOCAR PERF WALL MOTION  01/25/2008   mild anteroapical wall ischemia   PATENT DUCTUS ARTERIOUS REPAIR     at age 73    Family History  Problem Relation Age of Onset   Colon cancer Paternal Grandfather    Prostate cancer Paternal Grandfather    Aneurysm Father    Heart attack Father 4   Coronary artery disease Father    Colon  polyps Mother    Heart disease Mother    Coronary artery disease Mother    Migraines Mother    Breast cancer Maternal Grandmother    Colon cancer Paternal Uncle        x 2   Stomach cancer Brother    Liver disease Other        unsure who it was   Allergies Daughter     Social History   Socioeconomic History   Marital status: Married    Spouse name: Not on file   Number of children: Not on file   Years of education: Not on file   Highest education level: Not on file  Occupational History   Not on file  Tobacco Use   Smoking status: Never   Smokeless tobacco: Never  Vaping Use   Vaping Use: Never used  Substance and Sexual Activity   Alcohol use: No   Drug use: No   Sexual activity: Yes  Other Topics  Concern   Not on file  Social History Narrative   ** Merged History Encounter **       Holter monitor 08/2010: PVCs and sinus tachy.   Sleep Study (02/2008): mild sleep apnea, no indication for CPAP.   Social Determinants of Health   Financial Resource Strain: Not on file  Food Insecurity: Not on file  Transportation Needs: Not on file  Physical Activity: Not on file  Stress: Not on file  Social Connections: Not on file  Intimate Partner Violence: Not on file    Outpatient Medications Prior to Visit  Medication Sig Dispense Refill   albuterol (PROVENTIL) (2.5 MG/3ML) 0.083% nebulizer solution Take 3 mLs (2.5 mg total) by nebulization every 6 (six) hours as needed for wheezing or shortness of breath. 1125 mL 3   Ascorbic Acid (VITAMIN C) 1000 MG tablet Take 1,000 mg by mouth daily.     aspirin EC 81 MG EC tablet Take 1 tablet (81 mg total) by mouth daily. 30 tablet 0   atorvastatin (LIPITOR) 80 MG tablet Take 1 tablet (80 mg total) by mouth daily. 90 tablet 1   betamethasone dipropionate (DIPROLENE) 0.05 % cream Apply topically 2 (two) times daily. 30 g 0   cetirizine (ZYRTEC) 10 MG tablet Take 10 mg by mouth daily.     clonazePAM (KLONOPIN) 1 MG tablet Take 1/2 tab PO in the AM and 1 tab PO in the PM 45 tablet 2   DULoxetine (CYMBALTA) 30 MG capsule Take 1 capsule (30 mg total) by mouth daily. 90 capsule 1   ezetimibe (ZETIA) 10 MG tablet Take 1 tablet (10 mg total) by mouth daily. 90 tablet 1   fluticasone (FLONASE) 50 MCG/ACT nasal spray Place 2 sprays into both nostrils daily. 9.9 g 0   gabapentin (NEURONTIN) 300 MG capsule TAKE 1 CAPSULE BY MOUTH AT  6 AM, 11:30 AM AND 4:30 PM  AND TAKE 3 CAPSULES BY  MOUTH AT 10:00 PM 540 capsule 3   Icosapent Ethyl (VASCEPA) 1 g CAPS Take 2 capsules (2 g total) by mouth 2 (two) times daily. 120 capsule 0   isosorbide mononitrate (IMDUR) 30 MG 24 hr tablet TAKE 1/2 TABLET BY MOUTH DAILY 45 tablet 1   ketoconazole (NIZORAL) 2 % shampoo Apply  topically 2 (two) times a week. 360 mL 1   lisinopril (ZESTRIL) 10 MG tablet Take 1 tablet (10 mg total) by mouth daily. 90 tablet 1   meloxicam (MOBIC) 7.5 MG tablet Take 1 tablet (7.5  mg total) by mouth daily as needed for pain. 90 tablet 3   metFORMIN (GLUCOPHAGE-XR) 500 MG 24 hr tablet TAKE 2 TABLETS BY MOUTH  DAILY WITH BREAKFAST 180 tablet 3   metoprolol tartrate (LOPRESSOR) 50 MG tablet Take 1 tablet (50 mg total) by mouth 2 (two) times daily. 180 tablet 1   montelukast (SINGULAIR) 10 MG tablet Take 1 tablet (10 mg total) by mouth at bedtime. 90 tablet 0   Multiple Vitamin (MULTIVITAMIN WITH MINERALS) TABS tablet Take 1 tablet by mouth daily.     nitroGLYCERIN (NITROSTAT) 0.4 MG SL tablet Place 1 tablet (0.4 mg total) under the tongue every 5 (five) minutes as needed for chest pain. 25 tablet PRN   ONETOUCH VERIO test strip USE WITH METER TO CHECK  BLOOD SUGAR 3 TIMES DAILY 300 strip 12   pantoprazole (PROTONIX) 40 MG tablet Take 1 tablet (40 mg total) by mouth daily. 90 tablet 1   silver sulfADIAZINE (SILVADENE) 1 % cream Apply 1 application topically daily. 50 g 0   tamsulosin (FLOMAX) 0.4 MG CAPS capsule Take 1 capsule (0.4 mg total) by mouth daily. 90 capsule 1   tiZANidine (ZANAFLEX) 2 MG tablet TAKE 1 TABLET BY MOUTH  EVERY 6 HOURS AS NEEDED FOR MUSCLE SPASM(S) 270 tablet 0   traZODone (DESYREL) 100 MG tablet Take 1 tablet (100 mg total) by mouth at bedtime. 90 tablet 0   zolpidem (AMBIEN) 10 MG tablet TAKE 1 TABLET BY MOUTH AT  BEDTIME AS NEEDED FOR SLEEP 90 tablet 0   Zoster Vaccine Adjuvanted [facility]) injection Inject 0.5mg  IM now and repeat in 2-6 months. 0.5 mL 1   predniSONE (DELTASONE) 10 MG tablet 4 tabs by mouth once daily for 2 days, then 3 tabs daily x 2 days, then 2 tabs daily x 2 days, then 1 tab daily x 2 days 20 tablet 0   No facility-administered medications prior to visit.    Allergies  Allergen Reactions   Benadryl [Diphenhydramine Hcl]     "drives me nuts"     Review of Systems  Constitutional:  Positive for malaise/fatigue.  HENT:  Positive for congestion, sinus pain and sore throat.   Respiratory:  Positive for cough, sputum production, shortness of breath and wheezing.   Cardiovascular:  Positive for chest pain.  Musculoskeletal:  Positive for myalgias.  Neurological:  Positive for headaches.      Objective:    Physical Exam Constitutional:      General: He is not in acute distress.    Appearance: Normal appearance. He is not ill-appearing.  HENT:     Head: Normocephalic and atraumatic.     Right Ear: Tympanic membrane, ear canal and external ear normal.     Left Ear: Tympanic membrane, ear canal and external ear normal.  Eyes:     Extraocular Movements: Extraocular movements intact.     Pupils: Pupils are equal, round, and reactive to light.  Cardiovascular:     Rate and Rhythm: Normal rate and regular rhythm.     Heart sounds: Normal heart sounds. No murmur heard.   No gallop.  Pulmonary:     Effort: Pulmonary effort is normal. No respiratory distress.     Breath sounds: Wheezing present. No rales.     Comments: (+) expiratory wheezes (+) poor air movement to base of his lungs Lymphadenopathy:     Cervical: No cervical adenopathy.  Skin:    General: Skin is warm and dry.  Neurological:  Mental Status: He is alert and oriented to person, place, and time.  Psychiatric:        Behavior: Behavior normal.        Judgment: Judgment normal.    BP (!) 92/53 (BP Location: Right Arm, Cuff Size: Large)   Pulse 79   Temp 98.5 F (36.9 C) (Oral)   Resp 12   Ht 5\' 8"  (1.727 m)   Wt 193 lb 12.8 oz (87.9 kg)   SpO2 99%   BMI 29.47 kg/m  Wt Readings from Last 3 Encounters:  06/22/21 193 lb 12.8 oz (87.9 kg)  03/31/21 191 lb 9.6 oz (86.9 kg)  09/29/20 175 lb (79.4 kg)       Assessment & Plan:   Problem List Items Addressed This Visit       Unprioritized   Essential hypertension    BP Readings from Last 3  Encounters:  06/22/21 (!) 92/53  04/06/21 (!) 106/50  03/31/21 94/63        Bronchitis with bronchospasm    New.  Pt is advised as follows:  Please complete x-ray on the first floor. Start azithromycin (Zpak). Begin prednisone taper. Continue albuterol every 6 hours as needed.   Call if symptoms worsen or if symptoms do not improve.       Other Visit Diagnoses     Cough, unspecified type    -  Primary   Relevant Medications   predniSONE (DELTASONE) 10 MG tablet   azithromycin (ZITHROMAX) 250 MG tablet   Other Relevant Orders   DG Chest 2 View      Meds ordered this encounter  Medications   predniSONE (DELTASONE) 10 MG tablet    Sig: 4 tabs by mouth once daily for 3 days, then 3 tabs daily x 3 days, then 2 tabs daily x 3 days, then 1 tab daily x 3 days    Dispense:  30 tablet    Refill:  0    Order Specific Question:   Supervising Provider    Answer:   Penni Homans A [[id number]]   azithromycin (ZITHROMAX) 250 MG tablet    Sig: Take 2 tablets on day 1, then 1 tablet daily on days 2 through 5    Dispense:  6 tablet    Refill:  0    Order Specific Question:   Supervising Provider    Answer:   Penni Homans A [[id number]]    I, [name], NP, personally preformed the services described in this documentation.  All medical record entries made by the scribe were at my direction and in my presence.  I have reviewed the chart and discharge instructions (if applicable) and agree that the record reflects my personal performance and is accurate and complete. 06/22/2021  I,Lyric Barr-McArthur,acting as a Education administrator for Nance Pear, NP.,have documented all relevant documentation on the behalf of Nance Pear, NP,as directed by  Nance Pear, NP while in the presence of Nance Pear, NP.  Nance Pear, NP

## 2021-06-24 ENCOUNTER — Encounter: Payer: Self-pay | Admitting: Family

## 2021-06-28 ENCOUNTER — Encounter: Payer: Self-pay | Admitting: Family

## 2021-06-28 ENCOUNTER — Other Ambulatory Visit: Payer: Self-pay | Admitting: Family

## 2021-06-28 NOTE — Telephone Encounter (Signed)
Please contact pt to schedule a follow up visit since he is not improving.

## 2021-06-28 NOTE — Telephone Encounter (Signed)
Lvm for patient to call back, will try to get him an appointment for tomorrow

## 2021-06-29 ENCOUNTER — Telehealth (INDEPENDENT_AMBULATORY_CARE_PROVIDER_SITE_OTHER): Payer: Medicare Other | Admitting: Family

## 2021-06-29 ENCOUNTER — Other Ambulatory Visit: Payer: Self-pay

## 2021-06-29 ENCOUNTER — Encounter: Payer: Self-pay | Admitting: Physical Medicine & Rehabilitation

## 2021-06-29 ENCOUNTER — Encounter: Payer: Medicare Other | Attending: Physical Medicine & Rehabilitation | Admitting: Physical Medicine & Rehabilitation

## 2021-06-29 VITALS — BP 114/73 | HR 98 | Temp 97.8°F | Ht 68.0 in | Wt 195.2 lb

## 2021-06-29 VITALS — HR 106

## 2021-06-29 DIAGNOSIS — Z79891 Long term (current) use of opiate analgesic: Secondary | ICD-10-CM | POA: Insufficient documentation

## 2021-06-29 DIAGNOSIS — J45901 Unspecified asthma with (acute) exacerbation: Secondary | ICD-10-CM | POA: Diagnosis not present

## 2021-06-29 DIAGNOSIS — G894 Chronic pain syndrome: Secondary | ICD-10-CM | POA: Diagnosis not present

## 2021-06-29 DIAGNOSIS — Z5181 Encounter for therapeutic drug level monitoring: Secondary | ICD-10-CM | POA: Diagnosis not present

## 2021-06-29 DIAGNOSIS — R059 Cough, unspecified: Secondary | ICD-10-CM | POA: Diagnosis not present

## 2021-06-29 MED ORDER — DOXYCYCLINE HYCLATE 100 MG PO TABS: 100.0000 mg | ORAL_TABLET | Freq: Two times a day (BID) | ORAL | 0 refills | Status: DC

## 2021-06-29 MED ORDER — PREDNISONE 10 MG PO TABS: ORAL_TABLET | ORAL | 0 refills | Status: DC

## 2021-06-29 MED ORDER — PROMETHAZINE-DM 6.25-15 MG/5ML PO SYRP: 5.0000 mL | ORAL_SOLUTION | Freq: Four times a day (QID) | ORAL | 0 refills | Status: DC | PRN

## 2021-06-29 MED ORDER — TRAMADOL HCL 50 MG PO TABS: 50.0000 mg | ORAL_TABLET | Freq: Two times a day (BID) | ORAL | 5 refills | Status: DC

## 2021-06-29 MED ORDER — IPRATROPIUM-ALBUTEROL 0.5-2.5 (3) MG/3ML IN SOLN: 3.0000 mL | Freq: Four times a day (QID) | RESPIRATORY_TRACT | 0 refills | Status: DC | PRN

## 2021-06-29 NOTE — Patient Instructions (Signed)
[facility] Ask your health care provider which exercises are safe for you. Do exercises exactly as told by your health care provider and adjust them as directed. It is normal to feel mild stretching, pulling, tightness, or discomfort as you do these exercises. Stop right away if you feel sudden pain or your pain gets worse. Do not begin these exercises until told by your health care provider. Stretching and range-of-motion exercises These exercises warm up your muscles and joints and improve the movement and flexibility of your ankle. These exercises also help to relieve pain. Standing wall calf stretch with straight knee  Stand with your hands against a wall. Extend your left / right leg behind you, and bend your front knee slightly. Keep both of your heels on the floor. Point the toes of your back foot slightly inward. Keeping your heels on the floor and your back knee straight, shift your weight toward the wall. Do not allow your back to arch. You should feel a gentle stretch in your upper calf. Hold this position for __________ seconds. Repeat __________ times. Complete this exercise __________ times a day. Standing wall calf stretch with bent knee Stand with your hands against a wall. Extend your left / right leg behind you, and bend your front knee slightly. Keep both of your heels on the floor. Point the toes of your back foot slightly inward. Keeping your heels on the floor, bend your back knee slightly. You should feel a gentle stretch deep in your lower calf near your heel. Hold this position for __________ seconds. Repeat __________ times. Complete this exercise __________ times a day. Strengthening exercises These exercises build strength and control of your ankle. Endurance is the ability to use your muscles for a long time, even after they get tired. Plantar flexion with band In this exercise, you push your toes downward, away from you, with an exercise band  providing resistance. Sit on the floor with your left / right leg extended. You may put a pillow under your calf to give your foot more room to move. Loop a rubber exercise band or tube around the ball of your left / right foot. The ball of your foot is on the walking surface, right under your toes. The band or tube should be slightly tense when your foot is relaxed. If the band or tube slips, you can put on your shoe or put a washcloth between the band and your foot to help it stay in place. Slowly point your toes downward, pushing them away from you (plantar flexion). Hold this position for __________ seconds. Slowly release the tension in the band or tube, controlling smoothly until your foot is back to the starting position. Repeat steps 1-5 with your left / right leg. Repeat __________ times. Complete this exercise __________ times a day. Eccentric heel drop In this exercise, you stand and slowly raise your heel and then slowly lower it. This exercise lengthens the calf muscles (eccentric) while the heel bears weight. If this exercise is too easy, try doing it while wearing a backpack with weights in it. Stand on a step with the balls of your feet. The ball of your foot is on the walking surface, right under your toes. Do not put your heels on the step. For balance, rest your hands on the wall or on a railing. Rise up onto the balls of your feet. Keeping your heels up, shift all of your weight to your left / right leg and  pick up your other leg. Slowly lower your left / right leg so your heel drops below the level of the step. Put down your other foot before returning to the start position. If told by your health care provider, build up to: 3 sets of 15 repetitions while keeping your knees straight. 3 sets of 15 repetitions while keeping your knees slightly bent as far as told by your health care provider. Repeat __________ times. Complete this exercise __________ times a day. Balance  exercises These exercises improve or maintain your balance. Balance is important in preventing falls. Single leg stand If this exercise is too easy, you can try it with your eyes closed or while standing on a pillow. Without shoes, stand near a railing or in a door frame. Hold on to the railing or door frame as needed. Stand on your left / right foot. Keep your big toe down on the floor and try to keep your arch lifted. Hold this position for __________ seconds. Repeat __________ times. Complete this exercise __________ times a day. This information is not intended to replace advice given to you by your health care provider. Make sure you discuss any questions you have with your health care provider. Document Revised: 10/27/2020 Document Reviewed: 10/27/2020 Elsevier Patient Education  [place].

## 2021-06-29 NOTE — Progress Notes (Addendum)
MyChart Video Visit    Virtual Visit via Video Note   This visit type was conducted due to national recommendations for restrictions regarding the COVID-19 Pandemic (e.g. social distancing) in an effort to limit this patient's exposure and mitigate transmission in our community. This patient is at least at moderate risk for complications without adequate follow up. This format is felt to be most appropriate for this patient at this time. Physical exam was limited by quality of the video and audio technology used for the visit. Cody Raymond was able to get the patient set up on a video visit.  Patient location: Home Patient and provider in visit Provider location: Office  I discussed the limitations of evaluation and management by telemedicine and the availability of in person appointments. The patient expressed understanding and agreed to proceed.  Visit Date: 06/29/2021  Today's healthcare provider: Nance Pear, NP     Subjective:    Patient ID: Cody Raymond, male    DOB: [birth year], 50 y.o.   MRN: [id number]  Chief Complaint  Patient presents with   Headache    Complains of persistent headache   Cough    Productive cough with thick mucous, hard to breath due to congestion.     HPI Patient is in today for a video visit.  Respiratory: He was last seen in the office on 06/22/2021 where he was diagnosed with bronchitis and was prescribed a Zithromax Z-pack and prednisone taper. He had also taken a chest x-ray and the results found no sign of pneumonia. Today he is being seen because he is still experiencing cough, congestion, wheezing, SOB, and sputum production. He denies his symptoms worsening but notes that it has not improved. He checked his oxygen level at home and it was recorded at 96% and his heart rate was 106 bpm. He has been using a nebulizer at home and OTC Mucinex. He notes that now his ribs are sore from how violent his cough is.  Relevant history: His grandson  was sick and then he got sick. Neither him nor his grandson tested positive for Covid-19 and now his wife is sick. He has a pulmonologist that he used to see but has not in a long time.   Past Medical History:  Diagnosis Date   Aneurysm ([place])    Asthma    Blood transfusion    CAD (coronary artery disease), non obstructive on cath 2011 04/05/2012   Coronary artery disease    Diabetes mellitus    Diabetes mellitus without complication ([place])    Enlarged prostate    GERD (gastroesophageal reflux disease)    H/O syncope    Heart disease    History of repair of patent ductus arteriosus 07/23/2016   Hyperlipidemia    Hypertension    Neuromuscular disorder ([place])    DJD   Refusal of blood transfusions as patient is Jehovah's Witness     Past Surgical History:  Procedure Laterality Date   APPENDECTOMY     BACK SURGERY     CARDIAC CATHETERIZATION  2007   Clean Cardiac Cath ([place]), with RCA 30% narrowing likely due to catheter induced spasm in 09/02/10.   CARDIAC CATHETERIZATION  09/02/2010   mod. nonobstructive disease in the RCA and CX, tortuous LAD   COLONOSCOPY W/ POLYPECTOMY  03/17/11   diminutive polyp   LOOP RECORDER EXPLANT N/A 01/14/2014   Procedure: LOOP RECORDER EXPLANT;  Surgeon: Sanda Klein, MD;  Location: [facility];  Service: Cardiovascular;  Laterality: N/A;   LOOP RECORDER IMPLANT  04/06/2012   Reveal XT 4529   LOOP RECORDER IMPLANT N/A 04/06/2012   Procedure: LOOP RECORDER IMPLANT;  Surgeon: Sanda Klein, MD;  Location: [facility];  Service: Cardiovascular;  Laterality: N/A;   NM MYOCAR PERF WALL MOTION  01/25/2008   mild anteroapical wall ischemia   PATENT DUCTUS ARTERIOUS REPAIR     at age 42    Family History  Problem Relation Age of Onset   Colon cancer Paternal Grandfather    Prostate cancer Paternal Grandfather    Aneurysm Father    Heart attack Father 60   Coronary artery disease Father    Colon polyps Mother    Heart disease Mother     Coronary artery disease Mother    Migraines Mother    Breast cancer Maternal Grandmother    Colon cancer Paternal Uncle        x 2   Stomach cancer Brother    Liver disease Other        unsure who it was   Allergies Daughter     Social History   Socioeconomic History   Marital status: Married    Spouse name: Not on file   Number of children: Not on file   Years of education: Not on file   Highest education level: Not on file  Occupational History   Not on file  Tobacco Use   Smoking status: Never   Smokeless tobacco: Never  Vaping Use   Vaping Use: Never used  Substance and Sexual Activity   Alcohol use: No   Drug use: No   Sexual activity: Yes  Other Topics Concern   Not on file  Social History Narrative   ** Merged History Encounter **       Holter monitor 08/2010: PVCs and sinus tachy.   Sleep Study (02/2008): mild sleep apnea, no indication for CPAP.   Social Determinants of Health   Financial Resource Strain: Not on file  Food Insecurity: Not on file  Transportation Needs: Not on file  Physical Activity: Not on file  Stress: Not on file  Social Connections: Not on file  Intimate Partner Violence: Not on file    Outpatient Medications Prior to Visit  Medication Sig Dispense Refill   Ascorbic Acid (VITAMIN C) 1000 MG tablet Take 1,000 mg by mouth daily.     aspirin EC 81 MG EC tablet Take 1 tablet (81 mg total) by mouth daily. 30 tablet 0   atorvastatin (LIPITOR) 80 MG tablet Take 1 tablet (80 mg total) by mouth daily. 90 tablet 1   betamethasone dipropionate (DIPROLENE) 0.05 % cream Apply topically 2 (two) times daily. 30 g 0   cetirizine (ZYRTEC) 10 MG tablet Take 10 mg by mouth daily.     clonazePAM (KLONOPIN) 1 MG tablet Take 1/2 tab PO in the AM and 1 tab PO in the PM 45 tablet 2   DULoxetine (CYMBALTA) 30 MG capsule Take 1 capsule (30 mg total) by mouth daily. 90 capsule 1   ezetimibe (ZETIA) 10 MG tablet Take 1 tablet (10 mg total) by mouth daily. 90  tablet 1   fluticasone (FLONASE) 50 MCG/ACT nasal spray Place 2 sprays into both nostrils daily. 9.9 g 0   gabapentin (NEURONTIN) 300 MG capsule TAKE 1 CAPSULE BY MOUTH AT  6 AM, 11:30 AM AND 4:30 PM  AND TAKE 3 CAPSULES BY  MOUTH AT 10:00 PM 540 capsule 3   Icosapent Ethyl (  VASCEPA) 1 g CAPS Take 2 capsules (2 g total) by mouth 2 (two) times daily. 120 capsule 0   isosorbide mononitrate (IMDUR) 30 MG 24 hr tablet TAKE 1/2 TABLET BY MOUTH DAILY 45 tablet 1   ketoconazole (NIZORAL) 2 % shampoo Apply topically 2 (two) times a week. 360 mL 1   lisinopril (ZESTRIL) 10 MG tablet Take 1 tablet (10 mg total) by mouth daily. 90 tablet 1   meloxicam (MOBIC) 7.5 MG tablet Take 1 tablet (7.5 mg total) by mouth daily as needed for pain. 90 tablet 3   metFORMIN (GLUCOPHAGE-XR) 500 MG 24 hr tablet TAKE 2 TABLETS BY MOUTH  DAILY WITH BREAKFAST 180 tablet 3   metoprolol tartrate (LOPRESSOR) 50 MG tablet Take 1 tablet (50 mg total) by mouth 2 (two) times daily. 180 tablet 1   montelukast (SINGULAIR) 10 MG tablet Take 1 tablet (10 mg total) by mouth at bedtime. 90 tablet 0   Multiple Vitamin (MULTIVITAMIN WITH MINERALS) TABS tablet Take 1 tablet by mouth daily.     nitroGLYCERIN (NITROSTAT) 0.4 MG SL tablet Place 1 tablet (0.4 mg total) under the tongue every 5 (five) minutes as needed for chest pain. 25 tablet PRN   ONETOUCH VERIO test strip USE WITH METER TO CHECK  BLOOD SUGAR 3 TIMES DAILY 300 strip 12   pantoprazole (PROTONIX) 40 MG tablet Take 1 tablet (40 mg total) by mouth daily. 90 tablet 1   silver sulfADIAZINE (SILVADENE) 1 % cream Apply 1 application topically daily. 50 g 0   tamsulosin (FLOMAX) 0.4 MG CAPS capsule Take 1 capsule (0.4 mg total) by mouth daily. 90 capsule 1   tiZANidine (ZANAFLEX) 2 MG tablet TAKE 1 TABLET BY MOUTH  EVERY 6 HOURS AS NEEDED FOR MUSCLE SPASM(S) 270 tablet 0   traMADol (ULTRAM) 50 MG tablet Take 1 tablet (50 mg total) by mouth 2 (two) times daily. 60 tablet 5   traZODone  (DESYREL) 100 MG tablet TAKE 1 TABLET BY MOUTH AT  BEDTIME 90 tablet 3   zolpidem (AMBIEN) 10 MG tablet TAKE 1 TABLET BY MOUTH AT  BEDTIME AS NEEDED FOR SLEEP 90 tablet 0   Zoster Vaccine Adjuvanted [facility]) injection Inject 0.5mg  IM now and repeat in 2-6 months. 0.5 mL 1   albuterol (PROVENTIL) (2.5 MG/3ML) 0.083% nebulizer solution Take 3 mLs (2.5 mg total) by nebulization every 6 (six) hours as needed for wheezing or shortness of breath. 1125 mL 3   predniSONE (DELTASONE) 10 MG tablet 4 tabs by mouth once daily for 3 days, then 3 tabs daily x 3 days, then 2 tabs daily x 3 days, then 1 tab daily x 3 days 30 tablet 0   No facility-administered medications prior to visit.    Allergies  Allergen Reactions   Benadryl [Diphenhydramine Hcl]     "drives me nuts"    Review of Systems  HENT:  Positive for congestion and sore throat.   Respiratory:  Positive for cough, sputum production, shortness of breath and wheezing.   Cardiovascular:  Positive for chest pain.  Musculoskeletal:        (+) rib pain      Objective:    Physical Exam Constitutional:      General: He is not in acute distress.    Appearance: Normal appearance. He is not ill-appearing.  HENT:     Head: Normocephalic and atraumatic.     Right Ear: External ear normal.     Left Ear: External ear normal.  Pulmonary:  Effort: Pulmonary effort is normal.     Comments: Coarse cough noted Neurological:     Mental Status: He is alert.  Psychiatric:        Behavior: Behavior normal.        Judgment: Judgment normal.    Pulse (!) 106   SpO2 96%  Wt Readings from Last 3 Encounters:  06/29/21 195 lb 3.2 oz (88.5 kg)  06/22/21 193 lb 12.8 oz (87.9 kg)  03/31/21 191 lb 9.6 oz (86.9 kg)    Diabetic Foot Exam - Simple   No data filed    Lab Results  Component Value Date   WBC 6.7 02/03/2020   HGB 13.6 02/03/2020   HCT 41.8 02/03/2020   PLT 249 02/03/2020   GLUCOSE 138 (H) 03/31/2021   CHOL 119 03/31/2021    TRIG 129.0 03/31/2021   HDL 41.80 03/31/2021   LDLDIRECT 36 04/19/2019   LDLCALC 51 03/31/2021   ALT 22 03/31/2021   AST 20 03/31/2021   NA 136 03/31/2021   K 4.4 03/31/2021   CL 100 03/31/2021   CREATININE 0.89 03/31/2021   BUN 15 03/31/2021   CO2 32 03/31/2021   TSH 2.340 03/10/2020   PSA 0.55 11/10/2010   INR 0.86 10/26/2018   HGBA1C 6.1 03/31/2021   MICROALBUR 1.15 11/20/2013    Lab Results  Component Value Date   TSH 2.340 03/10/2020   Lab Results  Component Value Date   WBC 6.7 02/03/2020   HGB 13.6 02/03/2020   HCT 41.8 02/03/2020   MCV 81.3 02/03/2020   PLT 249 02/03/2020   Lab Results  Component Value Date   NA 136 03/31/2021   K 4.4 03/31/2021   CO2 32 03/31/2021   GLUCOSE 138 (H) 03/31/2021   BUN 15 03/31/2021   CREATININE 0.89 03/31/2021   BILITOT 0.8 03/31/2021   ALKPHOS 92 03/31/2021   AST 20 03/31/2021   ALT 22 03/31/2021   PROT 7.3 03/31/2021   ALBUMIN 4.6 03/31/2021   CALCIUM 9.3 03/31/2021   ANIONGAP 12 02/03/2020   GFR 100.01 03/31/2021   Lab Results  Component Value Date   CHOL 119 03/31/2021   Lab Results  Component Value Date   HDL 41.80 03/31/2021   Lab Results  Component Value Date   LDLCALC 51 03/31/2021   Lab Results  Component Value Date   TRIG 129.0 03/31/2021   Lab Results  Component Value Date   CHOLHDL 3 03/31/2021   Lab Results  Component Value Date   HGBA1C 6.1 03/31/2021       Assessment & Plan:   Problem List Items Addressed This Visit       Unprioritized   Asthma with acute exacerbation    Uncontrolled. Will continue prednisone taper as below. Obtain follow up CXR to rule out development of PNA. Promethazine DM  PRN cough. Rx with doxycycline 100mg  bid.  Duonebs Q6hrs. Arrange follow up with pulmonary as he has not seen them in some time.       Relevant Medications   predniSONE (DELTASONE) 10 MG tablet   ipratropium-albuterol (DUONEB) 0.5-2.5 (3) MG/3ML SOLN   Other Relevant Orders    Ambulatory referral to Pulmonology   Other Visit Diagnoses     Cough, unspecified type    -  Primary   Relevant Orders   DG Chest 2 View   Ambulatory referral to Pulmonology      Meds ordered this encounter  Medications   promethazine-dextromethorphan (PROMETHAZINE-DM) 6.25-15 MG/5ML syrup  Sig: Take 5 mLs by mouth 4 (four) times daily as needed for cough.    Dispense:  118 mL    Refill:  0    Order Specific Question:   Supervising Provider    Answer:   Penni Homans A [[id number]]   doxycycline (VIBRA-TABS) 100 MG tablet    Sig: Take 1 tablet (100 mg total) by mouth 2 (two) times daily.    Dispense:  14 tablet    Refill:  0    Order Specific Question:   Supervising Provider    Answer:   Penni Homans A [[id number]]   predniSONE (DELTASONE) 10 MG tablet    Sig: 3 tabs by mouth once daily for 3 days, then 2 tabs daily x 3 days, then 1 tabs daily x  3 days then stop    Dispense:  18 tablet    Refill:  0    Order Specific Question:   Supervising Provider    Answer:   Penni Homans A [[id number]]   ipratropium-albuterol (DUONEB) 0.5-2.5 (3) MG/3ML SOLN    Sig: Take 3 mLs by nebulization every 6 (six) hours as needed.    Dispense:  360 mL    Refill:  0    Order Specific Question:   Supervising Provider    Answer:   Penni Homans A [[id number]]    I discussed the assessment and treatment plan with the patient. The patient was provided an opportunity to ask questions and all were answered. The patient agreed with the plan and demonstrated an understanding of the instructions.   The patient was advised to call back or seek an in-person evaluation if the symptoms worsen or if the condition fails to improve as anticipated.  I,Lyric Barr-McArthur,acting as a Education administrator for [employer], NP.,have documented all relevant documentation on the behalf of Nance Pear, NP,as directed by  Nance Pear, NP while in the presence of Nance Pear, NP.  I provided 20 minutes of  face-to-face time during this encounter.   Nance Pear, NP [employer] [facility] [employer] [phone] (phone) [phone] (fax)  [place]

## 2021-06-29 NOTE — Telephone Encounter (Signed)
Patient scheduled for virtual visit this afternoon

## 2021-06-29 NOTE — Assessment & Plan Note (Addendum)
Uncontrolled. Will continue prednisone taper as below. Obtain follow up CXR to rule out development of PNA. Promethazine DM  PRN cough. Rx with doxycycline 100mg  bid.  Duonebs Q6hrs. Arrange follow up with pulmonary as he has not seen them in some time.

## 2021-06-29 NOTE — Progress Notes (Signed)
Subjective:    Patient ID: Cody Raymond, male    DOB: 08-Jun-1971, 50 y.o.   MRN: [id number] 50 year old male with history of work-related injury in 2012 that was subsequently settled.  He underwent decompressive laminectomy and microdiscectomy foraminotomies right L5 4-L5.  Preoperatively he had ankle dorsiflexor and toe extensor weakness.  Orthopedics gave him permanent restrictions lifting no greater than 5 pounds no stooping or bending has gone through PT in the past.  Last visit with me was 01/28/2020  taking tramadol 50 mg twice daily  Has not taking tramadol due to no appt, some problems with finances The patient has substituted nonsteroidal anti-inflammatories and Tylenol but these are not as effective  Also issues with depression  Had COVID-19 without hospitalization in 2021  Was on prednisone for ~4wks, also had prolonged dyspnea, not on O2  Ambulating 15-36min walk ~2 x per week  Complains of tightness in the legs as well as the back.  He is doing the back exercises some but does not do any leg stretching exercises. Pain Inventory Average Pain 9 Pain Right Now 9 My pain is constant, sharp, burning, dull, stabbing, tingling, and aching  In the last 24 hours, has pain interfered with the following? General activity 4 Relation with others 4 Enjoyment of life 4 What TIME of day is your pain at its worst? morning , daytime, evening, and night Sleep (in general) Fair  Pain is worse with: walking, bending, sitting, inactivity, and standing Pain improves with: rest, heat/ice, medication, and injections Relief from Meds: 5  Family History  Problem Relation Age of Onset   Colon cancer Paternal Grandfather    Prostate cancer Paternal Grandfather    Aneurysm Father    Heart attack Father 7   Coronary artery disease Father    Colon polyps Mother    Heart disease Mother    Coronary artery disease Mother    Migraines Mother    Breast cancer Maternal Grandmother    Colon  cancer Paternal Uncle        x 2   Stomach cancer Brother    Liver disease Other        unsure who it was   Allergies Daughter    Social History   Socioeconomic History   Marital status: Married    Spouse name: Not on file   Number of children: Not on file   Years of education: Not on file   Highest education level: Not on file  Occupational History   Not on file  Tobacco Use   Smoking status: Never   Smokeless tobacco: Never  Vaping Use   Vaping Use: Never used  Substance and Sexual Activity   Alcohol use: No   Drug use: No   Sexual activity: Yes  Other Topics Concern   Not on file  Social History Narrative   ** Merged History Encounter **       Holter monitor 08/2010: PVCs and sinus tachy.   Sleep Study (02/2008): mild sleep apnea, no indication for CPAP.   Social Determinants of Health   Financial Resource Strain: Not on file  Food Insecurity: Not on file  Transportation Needs: Not on file  Physical Activity: Not on file  Stress: Not on file  Social Connections: Not on file   Past Surgical History:  Procedure Laterality Date   APPENDECTOMY     BACK SURGERY     CARDIAC CATHETERIZATION  2007   Clean Cardiac Cath ([place]), with RCA 30%  narrowing likely due to catheter induced spasm in 09/02/10.   CARDIAC CATHETERIZATION  09/02/2010   mod. nonobstructive disease in the RCA and CX, tortuous LAD   COLONOSCOPY W/ POLYPECTOMY  03/17/11   diminutive polyp   LOOP RECORDER EXPLANT N/A 01/14/2014   Procedure: LOOP RECORDER EXPLANT;  Surgeon: Sanda Klein, MD;  Location: [facility];  Service: Cardiovascular;  Laterality: N/A;   LOOP RECORDER IMPLANT  04/06/2012   Reveal XT 4529   LOOP RECORDER IMPLANT N/A 04/06/2012   Procedure: LOOP RECORDER IMPLANT;  Surgeon: Sanda Klein, MD;  Location: [facility];  Service: Cardiovascular;  Laterality: N/A;   NM MYOCAR PERF WALL MOTION  01/25/2008   mild anteroapical wall ischemia   PATENT DUCTUS ARTERIOUS REPAIR      at age 34   Past Surgical History:  Procedure Laterality Date   APPENDECTOMY     BACK SURGERY     CARDIAC CATHETERIZATION  2007   Clean Cardiac Cath ([place]), with RCA 30% narrowing likely due to catheter induced spasm in 09/02/10.   CARDIAC CATHETERIZATION  09/02/2010   mod. nonobstructive disease in the RCA and CX, tortuous LAD   COLONOSCOPY W/ POLYPECTOMY  03/17/11   diminutive polyp   LOOP RECORDER EXPLANT N/A 01/14/2014   Procedure: LOOP RECORDER EXPLANT;  Surgeon: Sanda Klein, MD;  Location: [facility];  Service: Cardiovascular;  Laterality: N/A;   LOOP RECORDER IMPLANT  04/06/2012   Reveal XT 4529   LOOP RECORDER IMPLANT N/A 04/06/2012   Procedure: LOOP RECORDER IMPLANT;  Surgeon: Sanda Klein, MD;  Location: [facility];  Service: Cardiovascular;  Laterality: N/A;   NM MYOCAR PERF WALL MOTION  01/25/2008   mild anteroapical wall ischemia   PATENT DUCTUS ARTERIOUS REPAIR     at age 35   Past Medical History:  Diagnosis Date   Aneurysm ([place])    Asthma    Blood transfusion    CAD (coronary artery disease), non obstructive on cath 2011 04/05/2012   Coronary artery disease    Diabetes mellitus    Diabetes mellitus without complication (HCC)    Enlarged prostate    GERD (gastroesophageal reflux disease)    H/O syncope    Heart disease    History of repair of patent ductus arteriosus 07/23/2016   Hyperlipidemia    Hypertension    Neuromuscular disorder (HCC)    DJD   Refusal of blood transfusions as patient is Jehovah's Witness    Temp 97.8 F (36.6 C)   Ht 5\' 8"  (1.727 m)   Wt 195 lb 3.2 oz (88.5 kg)   BMI 29.68 kg/m   Opioid Risk Score:   Fall Risk Score:  `1  Depression screen PHQ 2/9  Depression screen [facility] 2/9 06/29/2021 06/22/2021 05/18/2020 04/15/2020 08/21/2019 07/31/2018 04/25/2018  Decreased Interest 1 2 1 1  0 2 2  Down, Depressed, Hopeless - 2 1 1  0 2 1  PHQ - 2 Score 1 4 2 2  0 4 3  Altered sleeping - 2 1 0 - 2 2  Tired, decreased energy - 1 1  1  - 2 2  Change in appetite - 1 1 0 - 1 1  Feeling bad or failure about yourself  - 0 0 0 - 2 2  Trouble concentrating - 0 0 0 - 2 1  Moving slowly or fidgety/restless - 1 1 0 - 1 0  Suicidal thoughts - 0 0 0 - 0 0  PHQ-9 Score - 9 6 3  -  14 11  Difficult doing work/chores - Somewhat difficult Not difficult at all Not difficult at all - Very difficult Very difficult  Some recent data might be hidden    Review of Systems  Musculoskeletal:  Positive for back pain, gait problem and neck pain.       Pain down right hip to right foot  All other systems reviewed and are negative.     Objective:   Physical Exam Vitals and nursing note reviewed.  Constitutional:      Appearance: He is obese.  HENT:     Head: Normocephalic and atraumatic.  Eyes:     Extraocular Movements: Extraocular movements intact.     Conjunctiva/sclera: Conjunctivae normal.     Pupils: Pupils are equal, round, and reactive to light.  Musculoskeletal:     Comments: Tenderness palpation bilateral lumbar paraspinals.  Lumbar range of motion is reduced approximately 25% flexion extension lateral bending and rotation.  Skin:    General: Skin is warm and dry.  Neurological:     Mental Status: He is alert and oriented to person, place, and time.     Comments: Motor strength is 5/5 bilateral hip flexor knee extensor ankle dorsiflexor Gait is with a cane but no evidence of toe drag or knee instability Negative straight leg raising bilaterally  Psychiatric:        Mood and Affect: Mood normal.        Behavior: Behavior normal.          Assessment & Plan:   #1.  Lumbar postlaminectomy syndrome with chronic pain, he has had good relief with low-dose tramadol does not obtain good relief with nonsteroidal anti-inflammatories or Tylenol.  The patient also has history of coronary artery disease so nonsteroidals not recommended on a chronic basis. Will check oral swab Resume tramadol 50 mg twice daily Follow-up PM&R 6  months 2.  History of tendinitis as well as for lumbar and lower extremity flexibility.  Have given him stretching exercises for Achilles and hamstrings he will continue his back exercises. His oral swab shows nondisclosed opiates or illicit drugs we will cancel refills

## 2021-07-02 ENCOUNTER — Ambulatory Visit: Payer: Medicare Other | Admitting: Family

## 2021-07-03 LAB — DRUG TOX MONITOR 1 W/CONF, ORAL FLD
Alprazolam: NEGATIVE ng/mL (ref <0.50)
Amphetamines: NEGATIVE ng/mL (ref <10)
Barbiturates: NEGATIVE ng/mL (ref <10)
Benzodiazepines: POSITIVE ng/mL — AB (ref <0.50)
Buprenorphine: NEGATIVE ng/mL (ref <0.10)
Chlordiazepoxide: NEGATIVE ng/mL (ref <0.50)
Clonazepam: 1.18 ng/mL — ABNORMAL HIGH (ref <0.50)
Cocaine: NEGATIVE ng/mL (ref <5.0)
Diazepam: NEGATIVE ng/mL (ref <0.50)
Fentanyl: NEGATIVE ng/mL (ref <0.10)
Flunitrazepam: NEGATIVE ng/mL (ref <0.50)
Flurazepam: NEGATIVE ng/mL (ref <0.50)
Heroin Metabolite: NEGATIVE ng/mL (ref <1.0)
Lorazepam: NEGATIVE ng/mL (ref <0.50)
MARIJUANA: NEGATIVE ng/mL (ref <2.5)
MDMA: NEGATIVE ng/mL (ref <10)
Meprobamate: NEGATIVE ng/mL (ref <2.5)
Methadone: NEGATIVE ng/mL (ref <5.0)
Midazolam: NEGATIVE ng/mL (ref <0.50)
Nicotine Metabolite: NEGATIVE ng/mL (ref <5.0)
Nordiazepam: NEGATIVE ng/mL (ref <0.50)
Opiates: NEGATIVE ng/mL (ref <2.5)
Oxazepam: NEGATIVE ng/mL (ref <0.50)
Phencyclidine: NEGATIVE ng/mL (ref <10)
Tapentadol: NEGATIVE ng/mL (ref <5.0)
Temazepam: NEGATIVE ng/mL (ref <0.50)
Triazolam: NEGATIVE ng/mL (ref <0.50)
Zolpidem: NEGATIVE ng/mL (ref <5.0)

## 2021-07-03 LAB — DRUG TOX ALC METAB W/CON, ORAL FLD: Alcohol Metabolite: NEGATIVE ng/mL (ref <25)

## 2021-07-04 ENCOUNTER — Other Ambulatory Visit: Payer: Self-pay | Admitting: Family

## 2021-07-05 ENCOUNTER — Ambulatory Visit: Payer: Medicare Other | Admitting: Family

## 2021-07-05 ENCOUNTER — Other Ambulatory Visit: Payer: Self-pay | Admitting: Family

## 2021-07-07 ENCOUNTER — Telehealth: Payer: Self-pay | Admitting: *Deleted

## 2021-07-07 NOTE — Telephone Encounter (Signed)
Oral swab drug screen was consistent for prescribed medications.  ?

## 2021-07-08 ENCOUNTER — Other Ambulatory Visit: Payer: Self-pay | Admitting: Family

## 2021-07-25 ENCOUNTER — Encounter: Payer: Self-pay | Admitting: Internal Medicine

## 2021-08-02 DIAGNOSIS — Q142 Congenital malformation of optic disc: Secondary | ICD-10-CM | POA: Diagnosis not present

## 2021-08-02 DIAGNOSIS — E119 Type 2 diabetes mellitus without complications: Secondary | ICD-10-CM | POA: Diagnosis not present

## 2021-08-02 DIAGNOSIS — D3132 Benign neoplasm of left choroid: Secondary | ICD-10-CM | POA: Diagnosis not present

## 2021-08-02 DIAGNOSIS — H2513 Age-related nuclear cataract, bilateral: Secondary | ICD-10-CM | POA: Diagnosis not present

## 2021-08-02 DIAGNOSIS — H33103 Unspecified retinoschisis, bilateral: Secondary | ICD-10-CM | POA: Diagnosis not present

## 2021-08-14 ENCOUNTER — Other Ambulatory Visit: Payer: Self-pay | Admitting: Family

## 2021-08-16 NOTE — Telephone Encounter (Signed)
Requesting: clonazepam 1mg   Contract: 08/21/2019 UDS: 06/29/2021  Last Visit: 06/29/2021 Next Visit: None Last Refill: 03/31/2021 #45 and 2RF  Please Advise

## 2021-09-09 ENCOUNTER — Other Ambulatory Visit: Payer: Self-pay | Admitting: Family

## 2021-09-09 DIAGNOSIS — M545 Low back pain, unspecified: Secondary | ICD-10-CM

## 2021-09-19 ENCOUNTER — Other Ambulatory Visit: Payer: Self-pay | Admitting: Family

## 2021-09-20 NOTE — Telephone Encounter (Signed)
Patient is requesting a refill of the following medications: Requested Prescriptions   Pending Prescriptions Disp Refills   clonazePAM (KLONOPIN) 1 MG tablet [Pharmacy Med Name: clonazePAM 1 MG TABLET] 45 tablet     Sig: TAKE 1/2 TABLET BY MOUTH EVERY MORNING AND TAKE TAKE ONE TABLET BY MOUTH EVERY EVENING    Date of patient request: 09/20/21 Last office visit: 06/22/21 Date of last refill: 08/16/21 Last refill amount: 45 + 0 Follow up time period per chart: none yet

## 2021-09-21 ENCOUNTER — Other Ambulatory Visit: Payer: Self-pay | Admitting: Family

## 2021-09-21 DIAGNOSIS — E785 Hyperlipidemia, unspecified: Secondary | ICD-10-CM

## 2021-09-22 ENCOUNTER — Encounter: Payer: Self-pay | Admitting: Family

## 2021-09-22 DIAGNOSIS — J209 Acute bronchitis, unspecified: Secondary | ICD-10-CM | POA: Diagnosis not present

## 2021-09-22 NOTE — Telephone Encounter (Signed)
Let's see if we can get him in with another provider tomorrow please. If symptoms worsen overnight he should go to the ER.

## 2021-09-23 ENCOUNTER — Ambulatory Visit: Payer: Medicare Other | Admitting: Family Medicine

## 2021-09-23 DIAGNOSIS — J45901 Unspecified asthma with (acute) exacerbation: Secondary | ICD-10-CM | POA: Diagnosis not present

## 2021-09-23 DIAGNOSIS — R051 Acute cough: Secondary | ICD-10-CM | POA: Diagnosis not present

## 2021-09-23 NOTE — Telephone Encounter (Signed)
Patient was seen at urgent care and diagnosed with a sinus infection as well as "beginning of bronchitis".  Was given antibiotics.

## 2021-09-29 ENCOUNTER — Encounter: Payer: Self-pay | Admitting: Family

## 2021-09-29 DIAGNOSIS — E785 Hyperlipidemia, unspecified: Secondary | ICD-10-CM

## 2021-09-29 MED ORDER — TAMSULOSIN HCL 0.4 MG PO CAPS: 0.4000 mg | ORAL_CAPSULE | Freq: Every day | ORAL | 0 refills | Status: DC

## 2021-09-29 MED ORDER — LISINOPRIL 10 MG PO TABS: 10.0000 mg | ORAL_TABLET | Freq: Every day | ORAL | 0 refills | Status: DC

## 2021-09-29 MED ORDER — PANTOPRAZOLE SODIUM 40 MG PO TBEC: 40.0000 mg | DELAYED_RELEASE_TABLET | Freq: Every day | ORAL | 1 refills | Status: DC

## 2021-09-29 MED ORDER — ATORVASTATIN CALCIUM 80 MG PO TABS: 80.0000 mg | ORAL_TABLET | Freq: Every day | ORAL | 0 refills | Status: DC

## 2021-09-29 MED ORDER — METOPROLOL TARTRATE 50 MG PO TABS: 50.0000 mg | ORAL_TABLET | Freq: Two times a day (BID) | ORAL | 0 refills | Status: DC

## 2021-09-29 MED ORDER — METFORMIN HCL ER 500 MG PO TB24: 1000.0000 mg | ORAL_TABLET | Freq: Every day | ORAL | 3 refills | Status: DC

## 2021-09-29 MED ORDER — DULOXETINE HCL 30 MG PO CPEP: 30.0000 mg | ORAL_CAPSULE | Freq: Every day | ORAL | 0 refills | Status: DC

## 2021-10-01 ENCOUNTER — Other Ambulatory Visit: Payer: Self-pay | Admitting: Family

## 2021-10-01 ENCOUNTER — Telehealth: Payer: Self-pay

## 2021-10-01 ENCOUNTER — Other Ambulatory Visit: Payer: Self-pay | Admitting: Physical Medicine & Rehabilitation

## 2021-10-01 DIAGNOSIS — M545 Low back pain, unspecified: Secondary | ICD-10-CM

## 2021-10-01 NOTE — Telephone Encounter (Signed)
Patient is requesting Tramadol to be sent to [facility]. If this is ok I will call Kristopher Oppenheim and cancel current prescription.

## 2021-10-05 MED ORDER — TRAMADOL HCL 50 MG PO TABS: 50.0000 mg | ORAL_TABLET | Freq: Two times a day (BID) | ORAL | 5 refills | Status: DC

## 2021-10-14 ENCOUNTER — Other Ambulatory Visit: Payer: Self-pay

## 2021-10-14 ENCOUNTER — Telehealth: Payer: Self-pay

## 2021-10-14 ENCOUNTER — Ambulatory Visit: Payer: Medicare Other | Admitting: Pulmonary Disease

## 2021-10-14 ENCOUNTER — Encounter: Payer: Self-pay | Admitting: Pulmonary Disease

## 2021-10-14 VITALS — BP 110/68 | HR 61 | Ht 68.0 in | Wt 196.0 lb

## 2021-10-14 DIAGNOSIS — R0982 Postnasal drip: Secondary | ICD-10-CM

## 2021-10-14 DIAGNOSIS — J4541 Moderate persistent asthma with (acute) exacerbation: Secondary | ICD-10-CM

## 2021-10-14 MED ORDER — PREDNISONE 10 MG PO TABS: ORAL_TABLET | ORAL | 0 refills | Status: AC

## 2021-10-14 MED ORDER — IPRATROPIUM BROMIDE 0.03 % NA SOLN: 2.0000 | Freq: Two times a day (BID) | NASAL | 12 refills | Status: DC

## 2021-10-14 MED ORDER — BUDESONIDE-FORMOTEROL FUMARATE 160-4.5 MCG/ACT IN AERO: 2.0000 | INHALATION_SPRAY | Freq: Two times a day (BID) | RESPIRATORY_TRACT | 6 refills | Status: DC

## 2021-10-14 NOTE — Progress Notes (Signed)
Synopsis: Referred in January 2023 for cough/asthma by [name], NP  Subjective:   PATIENT ID: Cody Raymond GENDER: male DOB: [birth year], MRN: [id number]  HPI  Chief Complaint  Patient presents with   Consult    Referred by PCP chronic bronchitis. States he will develop bronchitis 4 times a year. Has had covid twice.    Cody Raymond is a 51 year old male, never smoker with history of GERD, hypertension, and coronary artery disease who is referred to pulmonary clinic for cough and concern of asthma.   Primary care note reviewed from 06/22/2021 and 06/19/2021. He had upper respiratory viral infection that started in early October and Covid 19 infection in August/September. He was treated with azithromycin and prednisone taper. Chest radiograph at that time was unremarkable. The cough persisted and he was then treated with another prednisone taper and doxycyline. He was given combivent inhaler to be used as needed.  He reports he is currently using flovent 2 puffs twice daily (unsure of dose) and albuterol about 6 times per day. He does not find much relief from the albuterol. He is taking montelukast daily. He is experiencing wheezing, shortness of breath and cough. The cough is sometimes productive with yellow sputum. He has dry irritated throat in the morning time. He does have sinus congestion and post-nasal drainage. He denies issues with GERD.  He reports having asthma since childhood. Typical triggers include cold air and spring/fall allergies.   He is a never smoker but has second hand smoke exposure in childhood. His father smoked and died from lung cancer. On disability since fork lift injury 8 years ago. Used to work for [employer] 15 years ago - worked with them for 3 years.   Past Medical History:  Diagnosis Date   Aneurysm ([place])    Asthma    Blood transfusion    CAD (coronary artery disease), non obstructive on cath 2011 04/05/2012   Coronary artery  disease    Diabetes mellitus    Diabetes mellitus without complication (HCC)    Enlarged prostate    GERD (gastroesophageal reflux disease)    H/O syncope    Heart disease    History of repair of patent ductus arteriosus 07/23/2016   Hyperlipidemia    Hypertension    Neuromuscular disorder (HCC)    DJD   Refusal of blood transfusions as patient is Jehovah's Witness      Family History  Problem Relation Age of Onset   Colon cancer Paternal Grandfather    Prostate cancer Paternal Grandfather    Aneurysm Father    Heart attack Father 42   Coronary artery disease Father    Colon polyps Mother    Heart disease Mother    Coronary artery disease Mother    Migraines Mother    Breast cancer Maternal Grandmother    Colon cancer Paternal Uncle        x 2   Stomach cancer Brother    Liver disease Other        unsure who it was   Allergies Daughter      Social History   Socioeconomic History   Marital status: Married    Spouse name: Not on file   Number of children: Not on file   Years of education: Not on file   Highest education level: Not on file  Occupational History   Not on file  Tobacco Use   Smoking status: Never   Smokeless tobacco: Never  Vaping Use  Vaping Use: Never used  Substance and Sexual Activity   Alcohol use: No   Drug use: No   Sexual activity: Yes  Other Topics Concern   Not on file  Social History Narrative   ** Merged History Encounter **       Holter monitor 08/2010: PVCs and sinus tachy.   Sleep Study (02/2008): mild sleep apnea, no indication for CPAP.   Social Determinants of Health   Financial Resource Strain: Not on file  Food Insecurity: Not on file  Transportation Needs: Not on file  Physical Activity: Not on file  Stress: Not on file  Social Connections: Not on file  Intimate Partner Violence: Not on file     Allergies  Allergen Reactions   Benadryl [Diphenhydramine Hcl]     "drives me nuts"     Outpatient Medications  Prior to Visit  Medication Sig Dispense Refill   Ascorbic Acid (VITAMIN C) 1000 MG tablet Take 1,000 mg by mouth daily.     aspirin EC 81 MG EC tablet Take 1 tablet (81 mg total) by mouth daily. 30 tablet 0   atorvastatin (LIPITOR) 80 MG tablet Take 1 tablet (80 mg total) by mouth daily. 90 tablet 0   betamethasone dipropionate (DIPROLENE) 0.05 % cream Apply topically 2 (two) times daily. 30 g 0   cetirizine (ZYRTEC) 10 MG tablet Take 10 mg by mouth daily.     clonazePAM (KLONOPIN) 1 MG tablet TAKE 1/2 TABLET BY MOUTH EVERY MORNING AND TAKE TAKE ONE TABLET BY MOUTH EVERY EVENING 45 tablet 0   DULoxetine (CYMBALTA) 30 MG capsule Take 1 capsule (30 mg total) by mouth daily. 90 capsule 0   ezetimibe (ZETIA) 10 MG tablet TAKE 1 TABLET EVERY DAY 90 tablet 1   fluticasone (FLONASE) 50 MCG/ACT nasal spray Place 2 sprays into both nostrils daily. 9.9 g 0   gabapentin (NEURONTIN) 300 MG capsule TAKE 1 CAPSULE AT 6AM, 11:30AM, 4:30PM. TAKE 3 CAPSULES AT 10PM 540 capsule 3   Icosapent Ethyl (VASCEPA) 1 g CAPS Take 2 capsules (2 g total) by mouth 2 (two) times daily. 120 capsule 0   ipratropium-albuterol (DUONEB) 0.5-2.5 (3) MG/3ML SOLN INHALE 1 VIAL VIA NEBULIZATION EVERY 6 HOURS AS NEEDED 360 mL 1   isosorbide mononitrate (IMDUR) 30 MG 24 hr tablet TAKE 1/2 TABLET BY MOUTH DAILY 45 tablet 1   ketoconazole (NIZORAL) 2 % shampoo Apply topically 2 (two) times a week. 360 mL 1   lisinopril (ZESTRIL) 10 MG tablet Take 1 tablet (10 mg total) by mouth daily. 90 tablet 0   meloxicam (MOBIC) 7.5 MG tablet TAKE 1 TABLET DAILY AS NEEDED FOR PAIN 90 tablet 1   metFORMIN (GLUCOPHAGE-XR) 500 MG 24 hr tablet Take 2 tablets (1,000 mg total) by mouth daily with breakfast. 180 tablet 3   metoprolol tartrate (LOPRESSOR) 50 MG tablet Take 1 tablet (50 mg total) by mouth 2 (two) times daily. 180 tablet 0   montelukast (SINGULAIR) 10 MG tablet TAKE 1 TABLET AT BEDTIME 90 tablet 1   Multiple Vitamin (MULTIVITAMIN WITH MINERALS)  TABS tablet Take 1 tablet by mouth daily.     nitroGLYCERIN (NITROSTAT) 0.4 MG SL tablet Place 1 tablet (0.4 mg total) under the tongue every 5 (five) minutes as needed for chest pain. 25 tablet PRN   ONETOUCH VERIO test strip CHECK BLOOD SUGAR 3 TIMES DAILY 300 strip 3   pantoprazole (PROTONIX) 40 MG tablet Take 1 tablet (40 mg total) by mouth daily. 90 tablet  1   silver sulfADIAZINE (SILVADENE) 1 % cream Apply 1 application topically daily. 50 g 0   tamsulosin (FLOMAX) 0.4 MG CAPS capsule Take 1 capsule (0.4 mg total) by mouth daily. 90 capsule 0   tiZANidine (ZANAFLEX) 2 MG tablet TAKE 1 TABLET EVERY 6 HOURS AS NEEDED FOR MUSCLE SPASM(S) 360 tablet 1   traMADol (ULTRAM) 50 MG tablet Take 1 tablet (50 mg total) by mouth 2 (two) times daily. 60 tablet 5   traZODone (DESYREL) 100 MG tablet TAKE 1 TABLET AT BEDTIME 90 tablet 3   zolpidem (AMBIEN) 10 MG tablet TAKE 1 TABLET BY MOUTH AT  BEDTIME AS NEEDED FOR SLEEP 90 tablet 0   Zoster Vaccine Adjuvanted [facility]) injection Inject 0.5mg  IM now and repeat in 2-6 months. 0.5 mL 1   doxycycline (VIBRA-TABS) 100 MG tablet Take 1 tablet (100 mg total) by mouth 2 (two) times daily. 14 tablet 0   predniSONE (DELTASONE) 10 MG tablet 3 tabs by mouth once daily for 3 days, then 2 tabs daily x 3 days, then 1 tabs daily x  3 days then stop 18 tablet 0   promethazine-dextromethorphan (PROMETHAZINE-DM) 6.25-15 MG/5ML syrup Take 5 mLs by mouth 4 (four) times daily as needed for cough. 118 mL 0   No facility-administered medications prior to visit.   Review of Systems  Constitutional:  Negative for chills, fever, malaise/fatigue and weight loss.  HENT:  Positive for congestion and sore throat.   Eyes: Negative.   Respiratory:  Positive for cough, sputum production, shortness of breath and wheezing. Negative for hemoptysis.   Cardiovascular:  Negative for chest pain, palpitations, orthopnea, claudication and leg swelling.  Gastrointestinal:  Negative for  abdominal pain, heartburn, nausea and vomiting.  Genitourinary: Negative.   Musculoskeletal:  Negative for joint pain and myalgias.  Skin:  Negative for rash.  Neurological:  Negative for weakness.  Endo/Heme/Allergies: Negative.   Psychiatric/Behavioral: Negative.     Objective:   Vitals:   10/14/21 0916  BP: 110/68  Pulse: 61  SpO2: 99%  Weight: 196 lb (88.9 kg)  Height: 5\' 8"  (1.727 m)    Physical Exam Constitutional:      General: He is not in acute distress. HENT:     Head: Normocephalic and atraumatic.  Eyes:     Extraocular Movements: Extraocular movements intact.     Conjunctiva/sclera: Conjunctivae normal.     Pupils: Pupils are equal, round, and reactive to light.  Cardiovascular:     Rate and Rhythm: Normal rate and regular rhythm.     Pulses: Normal pulses.     Heart sounds: Normal heart sounds. No murmur heard. Pulmonary:     Breath sounds: Decreased air movement present. Wheezing (scattered) present.  Abdominal:     General: Bowel sounds are normal.     Palpations: Abdomen is soft.  Musculoskeletal:     Right lower leg: No edema.     Left lower leg: No edema.  Lymphadenopathy:     Cervical: No cervical adenopathy.  Skin:    General: Skin is warm and dry.  Neurological:     General: No focal deficit present.     Mental Status: He is alert.  Psychiatric:        Mood and Affect: Mood normal.        Behavior: Behavior normal.        Thought Content: Thought content normal.        Judgment: Judgment normal.   CBC    Component  Value Date/Time   WBC 6.7 02/03/2020 1816   RBC 5.14 02/03/2020 1816   HGB 13.6 02/03/2020 1816   HCT 41.8 02/03/2020 1816   PLT 249 02/03/2020 1816   MCV 81.3 02/03/2020 1816   MCH 26.5 02/03/2020 1816   MCHC 32.5 02/03/2020 1816   RDW 16.0 (H) 02/03/2020 1816   LYMPHSABS 2.0 08/21/2019 0953   MONOABS 0.5 08/21/2019 0953   EOSABS 0.1 08/21/2019 0953   BASOSABS 0.0 08/21/2019 0953   BMP Latest Ref Rng & Units  03/31/2021 05/18/2020 02/03/2020  Glucose 70 - 99 mg/dL 138(H) 111(H) 122(H)  BUN 6 - 23 mg/dL 15 14 10   Creatinine 0.40 - 1.50 mg/dL 0.89 0.83 0.99  BUN/Creat Ratio 6 - 22 (calc) - NOT APPLICABLE -  Sodium 829 - 145 mEq/L 136 138 136  Potassium 3.5 - 5.1 mEq/L 4.4 4.7 4.1  Chloride 96 - 112 mEq/L 100 100 101  CO2 19 - 32 mEq/L 32 23 23  Calcium 8.4 - 10.5 mg/dL 9.3 9.9 8.8(L)   Chest imaging: CXR 06/22/21 The heart size and mediastinal contours are within normal limits. Normal pulmonary vascularity. No focal consolidation, pleural effusion, or pneumothorax. Azygos lobe again noted. No acute osseous abnormality. Old left-sided rib fractures again noted.  PFT: PFT Results Latest Ref Rng & Units 03/26/2018  FVC-Pre L 3.24  FVC-Predicted Pre % 69  FVC-Post L 3.33  FVC-Predicted Post % 71  Pre FEV1/FVC % % 72  Post FEV1/FCV % % 77  FEV1-Pre L 2.33  FEV1-Predicted Pre % 63  FEV1-Post L 2.58  DLCO uncorrected ml/min/mmHg 8.36  DLCO UNC% % 29  DLVA Predicted % 94  TLC L 5.18  TLC % Predicted % 80  RV % Predicted % 115  Interpretation:  Spirometry: no airflow obstruction Lung volumes: normal Diffusion capacity: severely reduced  Labs:  Path:  Echo 12/15/18: LV EF 60-65%. RV normal size and function. LA normal size. RA normal size.  Heart Catheterization:  Assessment & Plan:   Moderate persistent asthma with acute exacerbation - Plan: predniSONE (DELTASONE) 10 MG tablet, budesonide-formoterol (SYMBICORT) 160-4.5 MCG/ACT inhaler, ipratropium (ATROVENT) 0.03 % nasal spray  Post-nasal drip  Discussion: Sebastion Jun is a 51 year old male, never smoker with history of GERD, hypertension, and coronary artery disease who is referred to pulmonary clinic for cough and concern of asthma.   He has moderate persistent asthma which has been aggravated from his covid infection in 05/2021 and possibly another upper respiratory viral illness in 06/2021.   He has active wheezing on exam  today, so we will treat with steroid taper.   He is to stop flovent inhaler and start breo ellipta 1 puff daily. He can continue as needed albuterol use.   He is to start ipratropium nasal spray for post-nasal drip.   Recommend he discuss with his primary care team about switching off lisinipril as this could be a possible cause to his on going cough symptoms.   Follow up in 1 month via video visit.   Freda Jackson, MD [facility] Office: [phone]   Current Outpatient Medications:    Ascorbic Acid (VITAMIN C) 1000 MG tablet, Take 1,000 mg by mouth daily., Disp: , Rfl:    aspirin EC 81 MG EC tablet, Take 1 tablet (81 mg total) by mouth daily., Disp: 30 tablet, Rfl: 0   atorvastatin (LIPITOR) 80 MG tablet, Take 1 tablet (80 mg total) by mouth daily., Disp: 90 tablet, Rfl: 0  betamethasone dipropionate (DIPROLENE) 0.05 % cream, Apply topically 2 (two) times daily., Disp: 30 g, Rfl: 0   budesonide-formoterol (SYMBICORT) 160-4.5 MCG/ACT inhaler, Inhale 2 puffs into the lungs 2 (two) times daily., Disp: 1 each, Rfl: 6   cetirizine (ZYRTEC) 10 MG tablet, Take 10 mg by mouth daily., Disp: , Rfl:    clonazePAM (KLONOPIN) 1 MG tablet, TAKE 1/2 TABLET BY MOUTH EVERY MORNING AND TAKE TAKE ONE TABLET BY MOUTH EVERY EVENING, Disp: 45 tablet, Rfl: 0   DULoxetine (CYMBALTA) 30 MG capsule, Take 1 capsule (30 mg total) by mouth daily., Disp: 90 capsule, Rfl: 0   ezetimibe (ZETIA) 10 MG tablet, TAKE 1 TABLET EVERY DAY, Disp: 90 tablet, Rfl: 1   fluticasone (FLONASE) 50 MCG/ACT nasal spray, Place 2 sprays into both nostrils daily., Disp: 9.9 g, Rfl: 0   gabapentin (NEURONTIN) 300 MG capsule, TAKE 1 CAPSULE AT 6AM, 11:30AM, 4:30PM. TAKE 3 CAPSULES AT 10PM, Disp: 540 capsule, Rfl: 3   Icosapent Ethyl (VASCEPA) 1 g CAPS, Take 2 capsules (2 g total) by mouth 2 (two) times daily., Disp: 120 capsule, Rfl: 0   ipratropium (ATROVENT) 0.03 % nasal spray, Place 2 sprays into both nostrils  every 12 (twelve) hours., Disp: 30 mL, Rfl: 12   ipratropium-albuterol (DUONEB) 0.5-2.5 (3) MG/3ML SOLN, INHALE 1 VIAL VIA NEBULIZATION EVERY 6 HOURS AS NEEDED, Disp: 360 mL, Rfl: 1   isosorbide mononitrate (IMDUR) 30 MG 24 hr tablet, TAKE 1/2 TABLET BY MOUTH DAILY, Disp: 45 tablet, Rfl: 1   ketoconazole (NIZORAL) 2 % shampoo, Apply topically 2 (two) times a week., Disp: 360 mL, Rfl: 1   lisinopril (ZESTRIL) 10 MG tablet, Take 1 tablet (10 mg total) by mouth daily., Disp: 90 tablet, Rfl: 0   meloxicam (MOBIC) 7.5 MG tablet, TAKE 1 TABLET DAILY AS NEEDED FOR PAIN, Disp: 90 tablet, Rfl: 1   metFORMIN (GLUCOPHAGE-XR) 500 MG 24 hr tablet, Take 2 tablets (1,000 mg total) by mouth daily with breakfast., Disp: 180 tablet, Rfl: 3   metoprolol tartrate (LOPRESSOR) 50 MG tablet, Take 1 tablet (50 mg total) by mouth 2 (two) times daily., Disp: 180 tablet, Rfl: 0   montelukast (SINGULAIR) 10 MG tablet, TAKE 1 TABLET AT BEDTIME, Disp: 90 tablet, Rfl: 1   Multiple Vitamin (MULTIVITAMIN WITH MINERALS) TABS tablet, Take 1 tablet by mouth daily., Disp: , Rfl:    nitroGLYCERIN (NITROSTAT) 0.4 MG SL tablet, Place 1 tablet (0.4 mg total) under the tongue every 5 (five) minutes as needed for chest pain., Disp: 25 tablet, Rfl: PRN   ONETOUCH VERIO test strip, CHECK BLOOD SUGAR 3 TIMES DAILY, Disp: 300 strip, Rfl: 3   pantoprazole (PROTONIX) 40 MG tablet, Take 1 tablet (40 mg total) by mouth daily., Disp: 90 tablet, Rfl: 1   predniSONE (DELTASONE) 10 MG tablet, Take 4 tablets (40 mg total) by mouth daily with breakfast for 3 days, THEN 3 tablets (30 mg total) daily with breakfast for 3 days, THEN 2 tablets (20 mg total) daily with breakfast for 3 days, THEN 1 tablet (10 mg total) daily with breakfast for 3 days., Disp: 30 tablet, Rfl: 0   silver sulfADIAZINE (SILVADENE) 1 % cream, Apply 1 application topically daily., Disp: 50 g, Rfl: 0   tamsulosin (FLOMAX) 0.4 MG CAPS capsule, Take 1 capsule (0.4 mg total) by mouth  daily., Disp: 90 capsule, Rfl: 0   tiZANidine (ZANAFLEX) 2 MG tablet, TAKE 1 TABLET EVERY 6 HOURS AS NEEDED FOR MUSCLE SPASM(S), Disp: 360 tablet, Rfl: 1  traMADol (ULTRAM) 50 MG tablet, Take 1 tablet (50 mg total) by mouth 2 (two) times daily., Disp: 60 tablet, Rfl: 5   traZODone (DESYREL) 100 MG tablet, TAKE 1 TABLET AT BEDTIME, Disp: 90 tablet, Rfl: 3   zolpidem (AMBIEN) 10 MG tablet, TAKE 1 TABLET BY MOUTH AT  BEDTIME AS NEEDED FOR SLEEP, Disp: 90 tablet, Rfl: 0   Zoster Vaccine Adjuvanted (SHINGRIX) injection, Inject 0.5mg  IM now and repeat in 2-6 months., Disp: 0.5 mL, Rfl: 1

## 2021-10-14 NOTE — Patient Instructions (Addendum)
Start Symbicort inhaler 2 puffs twice daily - rinse mouth out after each use  Use albuterol inhaler as needed every 4-6 hours  Start using ipratropium nasal spray, 2 sprays per nostril twice daily  Continue using flonase nasal spray, 2 sprays per nostril daily  Continue using montelukast daily  Start prednisone taper: 40mg  daily x 3 days 30mg  daily x 3 days 20mg  daily x 3 days 10mg  daily x 3 days

## 2021-10-14 NOTE — Telephone Encounter (Signed)
[facility] sent a fax stating "Concomitant use of Tramadol HCL 50 mg tab with Clonazepam 1 mg tablet may cause respiratory depression, Hypotension, sedation and Death. Please confirm ok to fill?

## 2021-10-15 ENCOUNTER — Telehealth: Payer: Self-pay

## 2021-10-15 NOTE — Telephone Encounter (Signed)
[facility] called and wanted to know if it is okay for the patient to continue to take Tramadol while he is also taking Clonazepam. She stated that it is a contraindication with these medications that can cause depression, hypotension, sedation and death. Please advise

## 2021-10-18 ENCOUNTER — Encounter: Payer: Self-pay | Admitting: Pulmonary Disease

## 2021-10-19 ENCOUNTER — Other Ambulatory Visit: Payer: Self-pay | Admitting: Family

## 2021-10-19 ENCOUNTER — Telehealth: Payer: Self-pay | Admitting: Family

## 2021-10-19 MED ORDER — CLONAZEPAM 1 MG PO TABS: ORAL_TABLET | ORAL | 0 refills | Status: DC

## 2021-10-19 MED ORDER — FLUTICASONE PROPIONATE 50 MCG/ACT NA SUSP: 2.0000 | Freq: Every day | NASAL | 0 refills | Status: DC

## 2021-10-19 MED ORDER — ZOLPIDEM TARTRATE 10 MG PO TABS: 10.0000 mg | ORAL_TABLET | Freq: Every evening | ORAL | 0 refills | Status: DC | PRN

## 2021-10-19 NOTE — Telephone Encounter (Signed)
Provider sent all prescriptions electronically to [facility].

## 2021-10-19 NOTE — Telephone Encounter (Signed)
Spoke with Malachy Mood, pharmacist at [facility] and clarified that it is okay to fill the Tramadol

## 2021-10-19 NOTE — Telephone Encounter (Signed)
Medication:  clonazePAM (KLONOPIN) 1 MG tablet [[id number]]     Has the patient contacted their pharmacy? No. (If no, request that the patient contact the pharmacy for the refill.) (If yes, when and what did the pharmacy advise?)     Preferred Pharmacy (with phone number or street name):  [facility], [address]  [place], [place] [address]  Phone:  [phone]  Fax:  [phone]     Agent: Please be advised that RX refills may take up to 3 business days. We ask that you follow-up with your pharmacy.

## 2021-10-19 NOTE — Telephone Encounter (Signed)
Sent to pharmacy 

## 2021-11-07 ENCOUNTER — Other Ambulatory Visit: Payer: Self-pay | Admitting: Family

## 2021-11-08 NOTE — Telephone Encounter (Signed)
Requesting:[employer] Contract:04/05/21 UDS:03/31/21 Last Visit:06/29/21 Next Visit: Last Refill:10/19/21  Please Advise

## 2021-11-12 ENCOUNTER — Ambulatory Visit: Payer: Medicare HMO | Admitting: Family

## 2021-11-16 ENCOUNTER — Telehealth (INDEPENDENT_AMBULATORY_CARE_PROVIDER_SITE_OTHER): Payer: Medicare HMO | Admitting: Pulmonary Disease

## 2021-11-16 ENCOUNTER — Encounter: Payer: Self-pay | Admitting: Pulmonary Disease

## 2021-11-16 ENCOUNTER — Other Ambulatory Visit: Payer: Self-pay

## 2021-11-16 ENCOUNTER — Other Ambulatory Visit (HOSPITAL_COMMUNITY): Payer: Self-pay

## 2021-11-16 DIAGNOSIS — J4540 Moderate persistent asthma, uncomplicated: Secondary | ICD-10-CM

## 2021-11-16 MED ORDER — FLUTICASONE-SALMETEROL 250-50 MCG/ACT IN AEPB: 1.0000 | INHALATION_SPRAY | Freq: Two times a day (BID) | RESPIRATORY_TRACT | 6 refills | Status: DC

## 2021-11-16 NOTE — Patient Instructions (Signed)
We will check with our pharmacy team and send in prescription for inhaler that is covered by your insurance.   Continue singulair daily   Continue albuterol inhaler/nebulizer treatments as needed  Follow up in 2 months via video visit.

## 2021-11-16 NOTE — Progress Notes (Addendum)
Virtual Visit via Video Note  I connected with Cody Raymond on 11/16/21 at  9:30 AM EST by a video enabled telemedicine application and verified that I am speaking with the correct person using two identifiers.  Location: Patient: Home Provider: Clinic   I discussed the limitations of evaluation and management by telemedicine and the availability of in person appointments. The patient expressed understanding and agreed to proceed.  History of Present Illness: Cody Raymond is a 51 year old male, never smoker with history of GERD, hypertension, and coronary artery disease who is referred to pulmonary clinic for cough and concern of asthma.    He was seen 10/14/21 with concern for moderate persistent asthma. He was treated with steroid taper.  He was prescribed Symbicort 160-4.5 mcg 2 puffs twice daily but this was not covered by his insurance company and he has not started any new maintenance inhaler since last visit.  He was started on ipratropium nasal spray for post nasal drip with improvement. We also recommended switching off lisinopril due to cough at that time which she plans on discussing with his primary care in the near future.  He is not having issues with cough.  He does report chest tightness due to allergies and exertional wheezing.  He is taking Singulair 10 mg daily.  He was unable to tolerate Zyrtec due to heart racing.  He is using albuterol inhaler and nebulizer treatments as needed.  Observations/Objective: No acute distress. Talking in full sentences Sclera anicteric No cough during interview  Assessment and Plan: Cody Raymond is a 51 year old male, never smoker with history of GERD, hypertension, and coronary artery disease who is referred to pulmonary clinic for cough and concern of asthma.   He appears to have moderate persistent asthma without issue at this time.  I have sent a message to our pharmacy team to determine which ICS/LABA inhaler his insurance  will cover and they recommended wixella inhub. I have sent wixella 250-31mcg 1 puff twice daily to his mail in pharmacy. He is to notify us if he has issues getting the inhaler.  He is to continue montelukast and albuterol inhaler/nebulizer treatments at this time.  Follow Up Instructions: He is to follow-up in 2 months via video visit   I discussed the assessment and treatment plan with the patient. The patient was provided an opportunity to ask questions and all were answered. The patient agreed with the plan and demonstrated an understanding of the instructions.   The patient was advised to call back or seek an in-person evaluation if the symptoms worsen or if the condition fails to improve as anticipated.  I provided 20 minutes of non-face-to-face time during this encounter.   Freddi Starr, MD

## 2021-11-16 NOTE — Addendum Note (Signed)
Addended by: Freda Jackson on: 11/16/2021 12:49 PM   Modules accepted: Orders

## 2021-11-30 DIAGNOSIS — J069 Acute upper respiratory infection, unspecified: Secondary | ICD-10-CM | POA: Diagnosis not present

## 2021-11-30 DIAGNOSIS — Z1152 Encounter for screening for COVID-19: Secondary | ICD-10-CM | POA: Diagnosis not present

## 2021-11-30 DIAGNOSIS — Z9189 Other specified personal risk factors, not elsewhere classified: Secondary | ICD-10-CM | POA: Diagnosis not present

## 2021-12-01 ENCOUNTER — Other Ambulatory Visit (HOSPITAL_BASED_OUTPATIENT_CLINIC_OR_DEPARTMENT_OTHER): Payer: Self-pay

## 2021-12-01 ENCOUNTER — Ambulatory Visit (INDEPENDENT_AMBULATORY_CARE_PROVIDER_SITE_OTHER): Payer: Medicare HMO | Admitting: Family Medicine

## 2021-12-01 ENCOUNTER — Encounter: Payer: Self-pay | Admitting: Family Medicine

## 2021-12-01 ENCOUNTER — Encounter: Payer: Self-pay | Admitting: Family

## 2021-12-01 VITALS — BP 115/64 | HR 76 | Temp 97.8°F | Ht 68.0 in | Wt 197.6 lb

## 2021-12-01 DIAGNOSIS — Z1211 Encounter for screening for malignant neoplasm of colon: Secondary | ICD-10-CM

## 2021-12-01 DIAGNOSIS — J40 Bronchitis, not specified as acute or chronic: Secondary | ICD-10-CM | POA: Diagnosis not present

## 2021-12-01 DIAGNOSIS — J329 Chronic sinusitis, unspecified: Secondary | ICD-10-CM

## 2021-12-01 MED ORDER — AMOXICILLIN-POT CLAVULANATE 875-125 MG PO TABS
1.0000 | ORAL_TABLET | Freq: Two times a day (BID) | ORAL | 0 refills | Status: AC
  Filled 2021-12-01: qty 20, 10d supply, fill #0

## 2021-12-01 MED ORDER — PREDNISONE 20 MG PO TABS
40.0000 mg | ORAL_TABLET | Freq: Every day | ORAL | 0 refills | Status: AC
  Filled 2021-12-01: qty 10, 5d supply, fill #0

## 2021-12-01 NOTE — Progress Notes (Signed)
Nasal congestion ?Coughing ?Yellow phlegm ?Robitussin ?

## 2021-12-01 NOTE — Progress Notes (Signed)
? ?Acute Office Visit ? ?Subjective:  ? ? Patient ID: Cody Raymond, male    DOB: [birth year], 51 y.o.   MRN: [id number] ? ?CC: cough, sinusitis  ? ? ?HPI ?Patient is in today for cough, sinusitis.  ? ?Patient is reporting a week of productive cough with dark sputum, shortness of breath, wheezing, chest tightness/soreness, frontal sinus pressure, headache, rhinorrhea, nasal congestion. Denies fevers, GI/GU symptoms, chest pain, ear pressure, body aches. Reports he has been using his nebs multiple times a day, does not need refills. He is working with pulm. to get a daily inhaler that his insurance will cover.  ? ?Usually gets bronchitis/sinus infections every spring - prednisone and zpak or Augmentin  ? ?States he had negative Flu and COVID testing yesterday at [facility]  ? ? ? ?Past Medical History:  ?Diagnosis Date  ? Aneurysm ([place])   ? Asthma   ? Blood transfusion   ? CAD (coronary artery disease), non obstructive on cath 2011 04/05/2012  ? Coronary artery disease   ? Diabetes mellitus   ? Diabetes mellitus without complication ([place])   ? Enlarged prostate   ? GERD (gastroesophageal reflux disease)   ? H/O syncope   ? Heart disease   ? History of repair of patent ductus arteriosus 07/23/2016  ? Hyperlipidemia   ? Hypertension   ? Neuromuscular disorder ([place])   ? DJD  ? Refusal of blood transfusions as patient is Jehovah's Witness   ? ? ?Past Surgical History:  ?Procedure Laterality Date  ? APPENDECTOMY    ? BACK SURGERY    ? CARDIAC CATHETERIZATION  2007  ? Clean Cardiac Cath [facility] Cards), with RCA 30% narrowing likely due to catheter induced spasm in 09/02/10.  ? CARDIAC CATHETERIZATION  09/02/2010  ? mod. nonobstructive disease in the RCA and CX, tortuous LAD  ? COLONOSCOPY W/ POLYPECTOMY  03/17/11  ? diminutive polyp  ? LOOP RECORDER EXPLANT N/A 01/14/2014  ? Procedure: LOOP RECORDER EXPLANT;  Surgeon: Sanda Klein, MD;  Location: [place] CATH LAB;  Service: Cardiovascular;  Laterality: N/A;  ? LOOP RECORDER  IMPLANT  04/06/2012  ? Reveal XT 4529  ? LOOP RECORDER IMPLANT N/A 04/06/2012  ? Procedure: LOOP RECORDER IMPLANT;  Surgeon: Sanda Klein, MD;  Location: [place] CATH LAB;  Service: Cardiovascular;  Laterality: N/A;  ? NM MYOCAR PERF WALL MOTION  01/25/2008  ? mild anteroapical wall ischemia  ? PATENT DUCTUS ARTERIOUS REPAIR    ? at age 28  ? ? ?Family History  ?Problem Relation Age of Onset  ? Colon cancer Paternal Grandfather   ? Prostate cancer Paternal Grandfather   ? Aneurysm Father   ? Heart attack Father 38  ? Coronary artery disease Father   ? Colon polyps Mother   ? Heart disease Mother   ? Coronary artery disease Mother   ? Migraines Mother   ? Breast cancer Maternal Grandmother   ? Colon cancer Paternal Uncle   ?     x 2  ? Stomach cancer Brother   ? Liver disease Other   ?     unsure who it was  ? Allergies Daughter   ? ? ?Social History  ? ?Socioeconomic History  ? Marital status: Married  ?  Spouse name: Not on file  ? Number of children: Not on file  ? Years of education: Not on file  ? Highest education level: Not on file  ?Occupational History  ? Not on file  ?Tobacco Use  ? Smoking status:  Never  ? Smokeless tobacco: Never  ?Vaping Use  ? Vaping Use: Never used  ?Substance and Sexual Activity  ? Alcohol use: No  ? Drug use: No  ? Sexual activity: Yes  ?Other Topics Concern  ? Not on file  ?Social History Narrative  ? ** Merged History Encounter **  ?    ? Holter monitor 08/2010: PVCs and sinus tachy.  ? Sleep Study (02/2008): mild sleep apnea, no indication for CPAP.  ? ?Social Determinants of Health  ? ?Financial Resource Strain: Not on file  ?Food Insecurity: Not on file  ?Transportation Needs: Not on file  ?Physical Activity: Not on file  ?Stress: Not on file  ?Social Connections: Not on file  ?Intimate Partner Violence: Not on file  ? ? ?Outpatient Medications Prior to Visit  ?Medication Sig Dispense Refill  ? Ascorbic Acid (VITAMIN C) 1000 MG tablet Take 1,000 mg by mouth daily.    ? aspirin EC 81 MG  EC tablet Take 1 tablet (81 mg total) by mouth daily. 30 tablet 0  ? atorvastatin (LIPITOR) 80 MG tablet Take 1 tablet (80 mg total) by mouth daily. 90 tablet 0  ? betamethasone dipropionate (DIPROLENE) 0.05 % cream Apply topically 2 (two) times daily. 30 g 0  ? cetirizine (ZYRTEC) 10 MG tablet Take 10 mg by mouth daily.    ? clonazePAM (KLONOPIN) 1 MG tablet TAKE 1/2 TABLET EVERY MORNING AND TAKE 1 TABLET EVERY EVENING 45 tablet 0  ? DULoxetine (CYMBALTA) 30 MG capsule Take 1 capsule (30 mg total) by mouth daily. 90 capsule 0  ? ezetimibe (ZETIA) 10 MG tablet TAKE 1 TABLET EVERY DAY 90 tablet 1  ? fluticasone (FLONASE) 50 MCG/ACT nasal spray USE 2 SPRAYS IN EACH NOSTRIL ONE TIME DAILY 16 g 1  ? fluticasone-salmeterol (WIXELA INHUB) 250-50 MCG/ACT AEPB Inhale 1 puff into the lungs in the morning and at bedtime. 60 each 6  ? gabapentin (NEURONTIN) 300 MG capsule TAKE 1 CAPSULE AT 6AM, 11:30AM, 4:30PM. TAKE 3 CAPSULES AT 10PM 540 capsule 3  ? Icosapent Ethyl (VASCEPA) 1 g CAPS Take 2 capsules (2 g total) by mouth 2 (two) times daily. 120 capsule 0  ? ipratropium (ATROVENT) 0.03 % nasal spray Place 2 sprays into both nostrils every 12 (twelve) hours. 30 mL 12  ? ipratropium-albuterol (DUONEB) 0.5-2.5 (3) MG/3ML SOLN INHALE 1 VIAL VIA NEBULIZATION EVERY 6 HOURS AS NEEDED 360 mL 1  ? isosorbide mononitrate (IMDUR) 30 MG 24 hr tablet TAKE 1/2 TABLET BY MOUTH DAILY 45 tablet 1  ? ketoconazole (NIZORAL) 2 % shampoo Apply topically 2 (two) times a week. 360 mL 1  ? lisinopril (ZESTRIL) 10 MG tablet Take 1 tablet (10 mg total) by mouth daily. 90 tablet 0  ? meloxicam (MOBIC) 7.5 MG tablet TAKE 1 TABLET DAILY AS NEEDED FOR PAIN 90 tablet 1  ? metFORMIN (GLUCOPHAGE-XR) 500 MG 24 hr tablet Take 2 tablets (1,000 mg total) by mouth daily with breakfast. 180 tablet 3  ? metoprolol tartrate (LOPRESSOR) 50 MG tablet Take 1 tablet (50 mg total) by mouth 2 (two) times daily. 180 tablet 0  ? montelukast (SINGULAIR) 10 MG tablet TAKE 1  TABLET AT BEDTIME 90 tablet 1  ? Multiple Vitamin (MULTIVITAMIN WITH MINERALS) TABS tablet Take 1 tablet by mouth daily.    ? nitroGLYCERIN (NITROSTAT) 0.4 MG SL tablet Place 1 tablet (0.4 mg total) under the tongue every 5 (five) minutes as needed for chest pain. 25 tablet PRN  ?  ONETOUCH VERIO test strip CHECK BLOOD SUGAR 3 TIMES DAILY 300 strip 3  ? pantoprazole (PROTONIX) 40 MG tablet Take 1 tablet (40 mg total) by mouth daily. 90 tablet 1  ? silver sulfADIAZINE (SILVADENE) 1 % cream Apply 1 application topically daily. 50 g 0  ? tamsulosin (FLOMAX) 0.4 MG CAPS capsule Take 1 capsule (0.4 mg total) by mouth daily. 90 capsule 0  ? tiZANidine (ZANAFLEX) 2 MG tablet TAKE 1 TABLET EVERY 6 HOURS AS NEEDED FOR MUSCLE SPASM(S) 360 tablet 1  ? traMADol (ULTRAM) 50 MG tablet Take 1 tablet (50 mg total) by mouth 2 (two) times daily. 60 tablet 5  ? traZODone (DESYREL) 100 MG tablet TAKE 1 TABLET AT BEDTIME 90 tablet 3  ? zolpidem (AMBIEN) 10 MG tablet TAKE 1 TABLET AT BEDTIME AS NEEDED FOR SLEEP 30 tablet 0  ? Zoster Vaccine Adjuvanted [facility]) injection Inject 0.'5mg'$  IM now and repeat in 2-6 months. 0.5 mL 1  ? ?No facility-administered medications prior to visit.  ? ? ?Allergies  ?Allergen Reactions  ? Benadryl [Diphenhydramine Hcl]   ?  "drives me nuts"  ? ? ?Review of Systems ?All review of systems negative except what is listed in the HPI ? ?   ?Objective:  ?  ?Physical Exam ?Vitals reviewed.  ?Constitutional:   ?   Appearance: Normal appearance.  ?Cardiovascular:  ?   Rate and Rhythm: Normal rate and regular rhythm.  ?Pulmonary:  ?   Effort: Pulmonary effort is normal.  ?   Breath sounds: Wheezing present. No rhonchi or rales.  ?Skin: ?   General: Skin is warm and dry.  ?Neurological:  ?   General: No focal deficit present.  ?   Mental Status: He is alert and oriented to person, place, and time. Mental status is at baseline.  ?Psychiatric:     ?   Mood and Affect: Mood normal.     ?   Behavior: Behavior normal.      ?   Thought Content: Thought content normal.     ?   Judgment: Judgment normal.  ? ? ?BP 115/64   Pulse 76   Temp 97.8 ?F (36.6 ?C)   Ht '5\' 8"'$  (1.727 m)   Wt 197 lb 9.6 oz (89.6 kg)   SpO2 98%   BMI 30.0

## 2021-12-06 ENCOUNTER — Telehealth: Payer: Self-pay

## 2021-12-06 ENCOUNTER — Encounter: Payer: Self-pay | Admitting: Cardiovascular Disease

## 2021-12-06 ENCOUNTER — Ambulatory Visit (INDEPENDENT_AMBULATORY_CARE_PROVIDER_SITE_OTHER): Payer: Medicare HMO

## 2021-12-06 VITALS — Ht 68.0 in | Wt 197.0 lb

## 2021-12-06 DIAGNOSIS — Z0000 Encounter for general adult medical examination without abnormal findings: Secondary | ICD-10-CM | POA: Diagnosis not present

## 2021-12-06 DIAGNOSIS — Z1211 Encounter for screening for malignant neoplasm of colon: Secondary | ICD-10-CM | POA: Diagnosis not present

## 2021-12-06 LAB — COLOGUARD: Cologuard: NEGATIVE

## 2021-12-06 NOTE — Progress Notes (Signed)
? ?Subjective:  ? Cody Raymond is a 51 y.o. male who presents for Medicare Annual/Subsequent preventive examination. ? ?I connected with Etienne today by telephone and verified that I am speaking with the correct person using two identifiers. ?Location patient: home ?Location provider: work ?Persons participating in the virtual visit: patient, nurse.  ?  ?I discussed the limitations, risks, security and privacy concerns of performing an evaluation and management service by telephone and the availability of in person appointments. I also discussed with the patient that there may be a patient responsible charge related to this service. The patient expressed understanding and verbally consented to this telephonic visit.  ?  ?Interactive audio and video telecommunications were attempted between this provider and patient, however failed, due to patient having technical difficulties OR patient did not have access to video capability.  We continued and completed visit with audio only. ? ?Some vital signs may be absent or patient reported.  ? ?Time Spent with patient on telephone encounter: 25 minutes ? ? ? ? ?Review of Systems    ? ?Cardiac Risk Factors include: diabetes mellitus;dyslipidemia;male gender;hypertension ? ?   ?Objective:  ?  ?Today's Vitals  ? 12/06/21 1052  ?Weight: 197 lb (89.4 kg)  ?Height: '5\' 8"'$  (1.727 m)  ? ?Body mass index is 29.95 kg/m?. ? ?Advanced Directives 12/06/2021 12/28/2019 05/27/2019 12/14/2018 10/26/2018 06/17/2018 04/17/2017  ?Does Patient Have a Medical Advance Directive? Yes No No Yes No No No  ?Type of Advance Directive Healthcare Power of [place]  ?Does patient want to make changes to medical advance directive? - - - No - Patient declined - - -  ?Copy of [place] in Chart? Yes - validated most recent copy scanned in chart (See row information) - - No - copy requested - - -  ?Would patient like information on creating a medical  advance directive? - No - Patient declined No - Guardian declined - No - Patient declined - -  ?Pre-existing out of facility DNR order (yellow form or pink MOST form) - - - - - - -  ? ? ?Current Medications (verified) ?Outpatient Encounter Medications as of 12/06/2021  ?Medication Sig  ? amoxicillin-clavulanate (AUGMENTIN) 875-125 MG tablet Take 1 tablet by mouth 2 (two) times daily for 10 days.  ? Ascorbic Acid (VITAMIN C) 1000 MG tablet Take 1,000 mg by mouth daily.  ? aspirin EC 81 MG EC tablet Take 1 tablet (81 mg total) by mouth daily.  ? atorvastatin (LIPITOR) 80 MG tablet Take 1 tablet (80 mg total) by mouth daily.  ? betamethasone dipropionate (DIPROLENE) 0.05 % cream Apply topically 2 (two) times daily.  ? cetirizine (ZYRTEC) 10 MG tablet Take 10 mg by mouth daily.  ? clonazePAM (KLONOPIN) 1 MG tablet TAKE 1/2 TABLET EVERY MORNING AND TAKE 1 TABLET EVERY EVENING  ? DULoxetine (CYMBALTA) 30 MG capsule Take 1 capsule (30 mg total) by mouth daily.  ? ezetimibe (ZETIA) 10 MG tablet TAKE 1 TABLET EVERY DAY  ? fluticasone (FLONASE) 50 MCG/ACT nasal spray USE 2 SPRAYS IN EACH NOSTRIL ONE TIME DAILY  ? fluticasone-salmeterol (WIXELA INHUB) 250-50 MCG/ACT AEPB Inhale 1 puff into the lungs in the morning and at bedtime.  ? gabapentin (NEURONTIN) 300 MG capsule TAKE 1 CAPSULE AT 6AM, 11:30AM, 4:30PM. TAKE 3 CAPSULES AT 10PM  ? Icosapent Ethyl (VASCEPA) 1 g CAPS Take 2 capsules (2 g total) by mouth 2 (two) times daily.  ? ipratropium (  ATROVENT) 0.03 % nasal spray Place 2 sprays into both nostrils every 12 (twelve) hours.  ? ipratropium-albuterol (DUONEB) 0.5-2.5 (3) MG/3ML SOLN INHALE 1 VIAL VIA NEBULIZATION EVERY 6 HOURS AS NEEDED  ? isosorbide mononitrate (IMDUR) 30 MG 24 hr tablet TAKE 1/2 TABLET BY MOUTH DAILY  ? ketoconazole (NIZORAL) 2 % shampoo Apply topically 2 (two) times a week.  ? lisinopril (ZESTRIL) 10 MG tablet Take 1 tablet (10 mg total) by mouth daily.  ? meloxicam (MOBIC) 7.5 MG tablet TAKE 1 TABLET  DAILY AS NEEDED FOR PAIN  ? metFORMIN (GLUCOPHAGE-XR) 500 MG 24 hr tablet Take 2 tablets (1,000 mg total) by mouth daily with breakfast.  ? metoprolol tartrate (LOPRESSOR) 50 MG tablet Take 1 tablet (50 mg total) by mouth 2 (two) times daily.  ? montelukast (SINGULAIR) 10 MG tablet TAKE 1 TABLET AT BEDTIME  ? Multiple Vitamin (MULTIVITAMIN WITH MINERALS) TABS tablet Take 1 tablet by mouth daily.  ? nitroGLYCERIN (NITROSTAT) 0.4 MG SL tablet Place 1 tablet (0.4 mg total) under the tongue every 5 (five) minutes as needed for chest pain.  ? ONETOUCH VERIO test strip CHECK BLOOD SUGAR 3 TIMES DAILY  ? pantoprazole (PROTONIX) 40 MG tablet Take 1 tablet (40 mg total) by mouth daily.  ? predniSONE (DELTASONE) 20 MG tablet Take 2 tablets (40 mg total) by mouth daily with breakfast for 5 days.  ? silver sulfADIAZINE (SILVADENE) 1 % cream Apply 1 application topically daily.  ? tamsulosin (FLOMAX) 0.4 MG CAPS capsule Take 1 capsule (0.4 mg total) by mouth daily.  ? tiZANidine (ZANAFLEX) 2 MG tablet TAKE 1 TABLET EVERY 6 HOURS AS NEEDED FOR MUSCLE SPASM(S)  ? traMADol (ULTRAM) 50 MG tablet Take 1 tablet (50 mg total) by mouth 2 (two) times daily.  ? traZODone (DESYREL) 100 MG tablet TAKE 1 TABLET AT BEDTIME  ? zolpidem (AMBIEN) 10 MG tablet TAKE 1 TABLET AT BEDTIME AS NEEDED FOR SLEEP  ? Zoster Vaccine Adjuvanted [facility]) injection Inject 0.'5mg'$  IM now and repeat in 2-6 months. (Patient not taking: Reported on 12/06/2021)  ? ?No facility-administered encounter medications on file as of 12/06/2021.  ? ? ?Allergies (verified) ?Benadryl [diphenhydramine hcl]  ? ?History: ?Past Medical History:  ?Diagnosis Date  ? Aneurysm ([place])   ? Asthma   ? Blood transfusion   ? CAD (coronary artery disease), non obstructive on cath 2011 04/05/2012  ? Coronary artery disease   ? Diabetes mellitus   ? Diabetes mellitus without complication ([place])   ? Enlarged prostate   ? GERD (gastroesophageal reflux disease)   ? H/O syncope   ? Heart disease   ?  History of repair of patent ductus arteriosus 07/23/2016  ? Hyperlipidemia   ? Hypertension   ? Neuromuscular disorder ([place])   ? DJD  ? Refusal of blood transfusions as patient is Jehovah's Witness   ? ?Past Surgical History:  ?Procedure Laterality Date  ? APPENDECTOMY    ? BACK SURGERY    ? CARDIAC CATHETERIZATION  2007  ? Clean Cardiac Cath [facility] Cards), with RCA 30% narrowing likely due to catheter induced spasm in 09/02/10.  ? CARDIAC CATHETERIZATION  09/02/2010  ? mod. nonobstructive disease in the RCA and CX, tortuous LAD  ? COLONOSCOPY W/ POLYPECTOMY  03/17/11  ? diminutive polyp  ? LOOP RECORDER EXPLANT N/A 01/14/2014  ? Procedure: LOOP RECORDER EXPLANT;  Surgeon: Sanda Klein, MD;  Location: [place] CATH LAB;  Service: Cardiovascular;  Laterality: N/A;  ? LOOP RECORDER IMPLANT  04/06/2012  ?  Reveal XT 4529  ? LOOP RECORDER IMPLANT N/A 04/06/2012  ? Procedure: LOOP RECORDER IMPLANT;  Surgeon: Sanda Klein, MD;  Location: [place] CATH LAB;  Service: Cardiovascular;  Laterality: N/A;  ? NM MYOCAR PERF WALL MOTION  01/25/2008  ? mild anteroapical wall ischemia  ? PATENT DUCTUS ARTERIOUS REPAIR    ? at age 36  ? ?Family History  ?Problem Relation Age of Onset  ? Colon cancer Paternal Grandfather   ? Prostate cancer Paternal Grandfather   ? Aneurysm Father   ? Heart attack Father 46  ? Coronary artery disease Father   ? Colon polyps Mother   ? Heart disease Mother   ? Coronary artery disease Mother   ? Migraines Mother   ? Breast cancer Maternal Grandmother   ? Colon cancer Paternal Uncle   ?     x 2  ? Stomach cancer Brother   ? Liver disease Other   ?     unsure who it was  ? Allergies Daughter   ? ?Social History  ? ?Socioeconomic History  ? Marital status: Married  ?  Spouse name: Not on file  ? Number of children: Not on file  ? Years of education: Not on file  ? Highest education level: Not on file  ?Occupational History  ? Not on file  ?Tobacco Use  ? Smoking status: Never  ? Smokeless tobacco: Never  ?Vaping  Use  ? Vaping Use: Never used  ?Substance and Sexual Activity  ? Alcohol use: No  ? Drug use: No  ? Sexual activity: Yes  ?Other Topics Concern  ? Not on file  ?Social History Narrative  ? ** Merged History En

## 2021-12-06 NOTE — Telephone Encounter (Signed)
? ?  Telephone encounter was:  Unsuccessful.  12/06/2021 ?Name: Phillip Maffei MRN: [id number] DOB: 04-Jun-1971 ? ?Unsuccessful outbound call made today to assist with:  Food Insecurity and Financial Difficulties related to bills ? ?Outreach Attempt:  1st Attempt ? ?A HIPAA compliant voice message was left requesting a return call.  Instructed patient to call back at [phone] at their earliest convenience. ? ?Cameron Proud ?Care Guide, Embedded Care Coordination ?[facility]  Care management  ?[place], [place]  ?Main Phone: [phone]  E-mail: [email or web address]  ?Website: [email or web address] ? ? ? ?

## 2021-12-06 NOTE — Patient Instructions (Signed)
Cody Raymond , ?Thank you for taking time to complete your Medicare Wellness Visit. I appreciate your ongoing commitment to your health goals. Please review the following plan we discussed and let me know if I can assist you in the future.  ? ?Screening recommendations/referrals: ?Colonoscopy: Cologuard ordered by PCP recently. ?Recommended yearly ophthalmology/optometry visit for glaucoma screening and checkup ?Recommended yearly dental visit for hygiene and checkup ? ?Vaccinations: ?Influenza vaccine: Up to date ?Pneumococcal vaccine: Up to date ?Tdap vaccine: Due-May obtain vaccine at your local pharmacy. ?Shingles vaccine:   May obtain vaccine at your local pharmacy. ?Covid-19: May obtain booster at your local pharmacy. ? ?Advanced directives: Copy in chart ? ?Conditions/risks identified: See problem list ? ?Next appointment: Follow up in one year for your annual wellness visit  ? ?Preventive Care 40-64 Years, Male ?Preventive care refers to lifestyle choices and visits with your health care provider that can promote health and wellness. ?What does preventive care include? ?A yearly physical exam. This is also called an annual well check. ?Dental exams once or twice a year. ?Routine eye exams. Ask your health care provider how often you should have your eyes checked. ?Personal lifestyle choices, including: ?Daily care of your teeth and gums. ?Regular physical activity. ?Eating a healthy diet. ?Avoiding tobacco and drug use. ?Limiting alcohol use. ?Practicing safe sex. ?Taking low-dose aspirin every day starting at age 24. ?What happens during an annual well check? ?The services and screenings done by your health care provider during your annual well check will depend on your age, overall health, lifestyle risk factors, and family history of disease. ?Counseling  ?Your health care provider may ask you questions about your: ?Alcohol use. ?Tobacco use. ?Drug use. ?Emotional well-being. ?Home and relationship  well-being. ?Sexual activity. ?Eating habits. ?Work and work Statistician. ?Screening  ?You may have the following tests or measurements: ?Height, weight, and BMI. ?Blood pressure. ?Lipid and cholesterol levels. These may be checked every 5 years, or more frequently if you are over 18 years old. ?Skin check. ?Lung cancer screening. You may have this screening every year starting at age 2 if you have a 30-pack-year history of smoking and currently smoke or have quit within the past 15 years. ?Fecal occult blood test (FOBT) of the stool. You may have this test every year starting at age 103. ?Flexible sigmoidoscopy or colonoscopy. You may have a sigmoidoscopy every 5 years or a colonoscopy every 10 years starting at age 31. ?Prostate cancer screening. Recommendations will vary depending on your family history and other risks. ?Hepatitis C blood test. ?Hepatitis B blood test. ?Sexually transmitted disease (STD) testing. ?Diabetes screening. This is done by checking your blood sugar (glucose) after you have not eaten for a while (fasting). You may have this done every 1-3 years. ?Discuss your test results, treatment options, and if necessary, the need for more tests with your health care provider. ?Vaccines  ?Your health care provider may recommend certain vaccines, such as: ?Influenza vaccine. This is recommended every year. ?Tetanus, diphtheria, and acellular pertussis (Tdap, Td) vaccine. You may need a Td booster every 10 years. ?Zoster vaccine. You may need this after age 72. ?Pneumococcal 13-valent conjugate (PCV13) vaccine. You may need this if you have certain conditions and have not been vaccinated. ?Pneumococcal polysaccharide (PPSV23) vaccine. You may need one or two doses if you smoke cigarettes or if you have certain conditions. ?Talk to your health care provider about which screenings and vaccines you need and how often you need them. ?This  information is not intended to replace advice given to you by your  health care provider. Make sure you discuss any questions you have with your health care provider. ?Document Released: 10/02/2015 Document Revised: 05/25/2016 Document Reviewed: 07/07/2015 ?Elsevier Interactive Patient Education ? 2017 [place]. ? ?Fall Prevention in the Home ?Falls can cause injuries. They can happen to people of all ages. There are many things you can do to make your home safe and to help prevent falls. ?What can I do on the outside of my home? ?Regularly fix the edges of walkways and driveways and fix any cracks. ?Remove anything that might make you trip as you walk through a door, such as a raised step or threshold. ?Trim any bushes or trees on the path to your home. ?Use bright outdoor lighting. ?Clear any walking paths of anything that might make someone trip, such as rocks or tools. ?Regularly check to see if handrails are loose or broken. Make sure that both sides of any steps have handrails. ?Any raised decks and porches should have guardrails on the edges. ?Have any leaves, snow, or ice cleared regularly. ?Use sand or salt on walking paths during winter. ?Clean up any spills in your garage right away. This includes oil or grease spills. ?What can I do in the bathroom? ?Use night lights. ?Install grab bars by the toilet and in the tub and shower. Do not use towel bars as grab bars. ?Use non-skid mats or decals in the tub or shower. ?If you need to sit down in the shower, use a plastic, non-slip stool. ?Keep the floor dry. Clean up any water that spills on the floor as soon as it happens. ?Remove soap buildup in the tub or shower regularly. ?Attach bath mats securely with double-sided non-slip rug tape. ?Do not have throw rugs and other things on the floor that can make you trip. ?What can I do in the bedroom? ?Use night lights. ?Make sure that you have a light by your bed that is easy to reach. ?Do not use any sheets or blankets that are too big for your bed. They should not hang down  onto the floor. ?Have a firm chair that has side arms. You can use this for support while you get dressed. ?Do not have throw rugs and other things on the floor that can make you trip. ?What can I do in the kitchen? ?Clean up any spills right away. ?Avoid walking on wet floors. ?Keep items that you use a lot in easy-to-reach places. ?If you need to reach something above you, use a strong step stool that has a grab bar. ?Keep electrical cords out of the way. ?Do not use floor polish or wax that makes floors slippery. If you must use wax, use non-skid floor wax. ?Do not have throw rugs and other things on the floor that can make you trip. ?What can I do with my stairs? ?Do not leave any items on the stairs. ?Make sure that there are handrails on both sides of the stairs and use them. Fix handrails that are broken or loose. Make sure that handrails are as long as the stairways. ?Check any carpeting to make sure that it is firmly attached to the stairs. Fix any carpet that is loose or worn. ?Avoid having throw rugs at the top or bottom of the stairs. If you do have throw rugs, attach them to the floor with carpet tape. ?Make sure that you have a light switch at the top  of the stairs and the bottom of the stairs. If you do not have them, ask someone to add them for you. ?What else can I do to help prevent falls? ?Wear shoes that: ?Do not have high heels. ?Have rubber bottoms. ?Are comfortable and fit you well. ?Are closed at the toe. Do not wear sandals. ?If you use a stepladder: ?Make sure that it is fully opened. Do not climb a closed stepladder. ?Make sure that both sides of the stepladder are locked into place. ?Ask someone to hold it for you, if possible. ?Clearly mark and make sure that you can see: ?Any grab bars or handrails. ?First and last steps. ?Where the edge of each step is. ?Use tools that help you move around (mobility aids) if they are needed. These include: ?Canes. ?Walkers. ?Scooters. ?Crutches. ?Turn  on the lights when you go into a dark area. Replace any light bulbs as soon as they burn out. ?Set up your furniture so you have a clear path. Avoid moving your furniture around. ?If any of your floors are une

## 2021-12-09 ENCOUNTER — Encounter: Payer: Self-pay | Admitting: Cardiovascular Disease

## 2021-12-12 ENCOUNTER — Other Ambulatory Visit: Payer: Self-pay | Admitting: Family

## 2021-12-12 DIAGNOSIS — E785 Hyperlipidemia, unspecified: Secondary | ICD-10-CM

## 2021-12-13 ENCOUNTER — Telehealth: Payer: Self-pay

## 2021-12-13 NOTE — Telephone Encounter (Signed)
? ?  Telephone encounter was:  Unsuccessful.  12/13/2021 ?Name: Cody Raymond MRN: [id number] DOB: 1971/01/05 ? ?Unsuccessful outbound call made today to assist with:  Financial Difficulties related to bills ? ?Outreach Attempt:  2nd Attempt ? ?A HIPAA compliant voice message was left requesting a return call.  Instructed patient to call back at [phone] at their earliest convenience. ? ?Cameron Proud ?Care Guide, Embedded Care Coordination ?[facility]  Care management  ?[place], [place]  ?Main Phone: [phone]  E-mail: [email or web address]  ?Website: [email or web address] ? ? ? ?

## 2021-12-14 ENCOUNTER — Telehealth: Payer: Self-pay

## 2021-12-14 ENCOUNTER — Telehealth: Payer: Self-pay | Admitting: Family

## 2021-12-14 NOTE — Telephone Encounter (Signed)
? ?  Telephone encounter was:  Unsuccessful.  12/14/2021 ?Name: Cody Raymond MRN: [id number] DOB: 07/18/1971 ? ?Unsuccessful outbound call made today to assist with:  Food Insecurity and Financial Difficulties related to bills ? ?Outreach Attempt:  3rd Attempt.  Referral closed unable to contact patient. ? ?A HIPAA compliant voice message was left requesting a return call.  Instructed patient to call back at [phone] at their earliest convenience. ? ?3rd attempt. Left voicemail on pt's phone 3 times and attempted to contact pt's spouse which was unsuccessful as well. Clinic will be notified of the unsuccessful attempts. ? ?Cameron Proud ?Care Guide, Embedded Care Coordination ?[facility]  Care management  ?[place], [place]  ?Main Phone: [phone]  E-mail: [email or web address]  ?Website: [email or web address] ?. ? ? ?

## 2021-12-14 NOTE — Telephone Encounter (Signed)
-----   Message from Jerrol Banana sent at 12/14/2021  4:42 PM EDT ----- ?Regarding: Unable to reach pt ?Hello, ? ?I am reaching out to provide an update regarding attached patient referral. ? ?Referral Closed   ?Outreach Attempt:  3rd Attempt.  Referral closed unable to contact patient. ?? ?A HIPAA compliant voice message was left requesting a return call.  Instructed patient to call back at [phone] at their earliest convenience. ?? ?3rd attempt. Left voicemail on pt's phone 3 times and attempted to contact pt's spouse which was unsuccessful as well. Clinic will be notified of the unsuccessful attempts. ? ?Clinic has now been notified. ? ?Thank you so much! ? ? ? ? ? ?

## 2021-12-17 ENCOUNTER — Other Ambulatory Visit: Payer: Self-pay | Admitting: Family

## 2021-12-18 LAB — COLOGUARD: COLOGUARD: NEGATIVE

## 2021-12-18 NOTE — Telephone Encounter (Signed)
Pt is overdue for follow up. Please call to schedule.  ?

## 2021-12-20 NOTE — Telephone Encounter (Signed)
Called Pt made appt for 4/11 for OV  ?

## 2021-12-22 ENCOUNTER — Encounter: Payer: Self-pay | Admitting: Physician Assistant

## 2021-12-22 ENCOUNTER — Ambulatory Visit (INDEPENDENT_AMBULATORY_CARE_PROVIDER_SITE_OTHER): Payer: Medicare HMO | Admitting: Physician Assistant

## 2021-12-22 VITALS — BP 116/70 | HR 81 | Ht 68.0 in | Wt 198.8 lb

## 2021-12-22 DIAGNOSIS — I10 Essential (primary) hypertension: Secondary | ICD-10-CM

## 2021-12-22 DIAGNOSIS — R002 Palpitations: Secondary | ICD-10-CM | POA: Diagnosis not present

## 2021-12-22 DIAGNOSIS — E785 Hyperlipidemia, unspecified: Secondary | ICD-10-CM | POA: Diagnosis not present

## 2021-12-22 DIAGNOSIS — J45909 Unspecified asthma, uncomplicated: Secondary | ICD-10-CM | POA: Diagnosis not present

## 2021-12-22 DIAGNOSIS — E119 Type 2 diabetes mellitus without complications: Secondary | ICD-10-CM | POA: Diagnosis not present

## 2021-12-22 DIAGNOSIS — I25119 Atherosclerotic heart disease of native coronary artery with unspecified angina pectoris: Secondary | ICD-10-CM

## 2021-12-22 NOTE — Progress Notes (Signed)
?Cardiology Office Note:   ? ?Date:  12/24/2021  ? ?ID:  Cody Raymond, DOB 28-Jul-1971, MRN [id number] ? ?PCP:  [name], NP ?  ?[facility] Providers ?Cardiologist:  Quay Burow, MD    ? ?Referring MD: [name], NP  ? ?Chief Complaint  ?Patient presents with  ? Follow-up  ?  Seen for Dr. Gwenlyn Found  ? ? ?History of Present Illness:   ? ?Cody Raymond is a 51 y.o. male with a hx of asthma, hypertension, hyperlipidemia, DM2, and history of neuromuscular disorder.  He was previously a patient of Dr. Sallyanne Kuster and Dr. Einar Gip and eventually transferred to Dr. Alvester Chou because of insurance issues.  He never smoked.  His mother had a history of CABG.  He is a Restaurant manager, fast food.  He never had a heart attack or stroke.  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  He had a negative Myoview in 2016.  His mother had a history of HOCM and has had a septal myomectomy.  He has been disabled and has not been working.  He had the same history of syncope in the past and a negative work-up including loop recorder implantation.  Loop recorder has been explanted.  Coronary CT performed on 02/26/2019 showed revealed a coronary calcium score of 0 and a completely normal coronary arteries.  2 weeks heart monitor showed rare episode of PSVT.  He admits to drink 3 cups of coffee per day however has since discontinued.  He also has symptoms associated with obstructive sleep apnea with nocturnal snoring and daytime somnolence.  He previously was seen at [facility] in [place] for tachycardia.  Stress test at the time was nonischemic and 2D echo was normal. ? ?Patient presents today for evaluation of occasional skipped heartbeat.  This symptom he described sounds like PAC and PVCs.  He has some recording on his cardia mobile device which is also consistent with our previous suspicion of PAC and PVCs.  He has not had any caffeinated drinks recently.  Last  TSH was normal in 2021.  He is due for blood work by his PCP.  He is unable to tolerate higher dose of metoprolol due to fatigue.  He denies any chest pain.  He has chronic dyspnea on exertion related to his asthma.  On physical exam, he does have some expiratory wheezing.  This should be followed by pulmonologist.  I instructed the patient to take an extra half a tablet of metoprolol if he has increased palpitation. ? ?Past Medical History:  ?Diagnosis Date  ? Aneurysm ([place])   ? Asthma   ? Blood transfusion   ? CAD (coronary artery disease), non obstructive on cath 2011 04/05/2012  ? Coronary artery disease   ? Diabetes mellitus   ? Diabetes mellitus without complication ([place])   ? Enlarged prostate   ? GERD (gastroesophageal reflux disease)   ? H/O syncope   ? Heart disease   ? History of repair of patent ductus arteriosus 07/23/2016  ? Hyperlipidemia   ? Hypertension   ? Neuromuscular disorder ([place])   ? DJD  ? Refusal of blood transfusions as patient is Jehovah's Witness   ? ? ?Past Surgical History:  ?Procedure Laterality Date  ? APPENDECTOMY    ? BACK SURGERY    ? CARDIAC CATHETERIZATION  2007  ? Clean Cardiac Cath [facility]), with RCA 30% narrowing likely due to catheter induced spasm in 09/02/10.  ? CARDIAC CATHETERIZATION  09/02/2010  ? mod. nonobstructive disease in the RCA and CX, tortuous LAD  ? COLONOSCOPY W/ POLYPECTOMY  03/17/11  ? diminutive polyp  ? LOOP RECORDER EXPLANT N/A 01/14/2014  ? Procedure: LOOP RECORDER EXPLANT;  Surgeon: Sanda Klein, MD;  Location: [facility];  Service: Cardiovascular;  Laterality: N/A;  ? LOOP RECORDER IMPLANT  04/06/2012  ? Reveal [id number]  ? LOOP RECORDER IMPLANT N/A 04/06/2012  ? Procedure: LOOP RECORDER IMPLANT;  Surgeon: Sanda Klein, MD;  Location: [facility];  Service: Cardiovascular;  Laterality: N/A;  ? NM MYOCAR PERF WALL MOTION  01/25/2008  ? mild anteroapical wall ischemia  ? PATENT DUCTUS ARTERIOUS REPAIR    ? at age 38  ? ? ?Current  Medications: ?Current Meds  ?Medication Sig  ? Ascorbic Acid (VITAMIN C) 1000 MG tablet Take 1,000 mg by mouth daily.  ? aspirin EC 81 MG EC tablet Take 1 tablet (81 mg total) by mouth daily.  ? atorvastatin (LIPITOR) 80 MG tablet TAKE 1 TABLET (80 MG TOTAL) BY MOUTH DAILY.  ? betamethasone dipropionate (DIPROLENE) 0.05 % cream Apply topically 2 (two) times daily.  ? cetirizine (ZYRTEC) 10 MG tablet Take 10 mg by mouth daily.  ? clonazePAM (KLONOPIN) 1 MG tablet TAKE 1/2 TABLET EVERY MORNING AND TAKE 1 TABLET EVERY EVENING  ? DULoxetine (CYMBALTA) 30 MG capsule TAKE 1 CAPSULE (30 MG TOTAL) BY MOUTH DAILY.  ? ezetimibe (ZETIA) 10 MG tablet TAKE 1 TABLET EVERY DAY  ? fluticasone (FLONASE) 50 MCG/ACT nasal spray USE 2 SPRAYS IN EACH NOSTRIL ONE TIME DAILY  ? fluticasone-salmeterol (WIXELA INHUB) 250-50 MCG/ACT AEPB Inhale 1 puff into the lungs in the morning and at bedtime.  ? gabapentin (NEURONTIN) 300 MG capsule TAKE 1 CAPSULE AT 6AM, 11:30AM, 4:30PM. TAKE 3 CAPSULES AT 10PM  ? Icosapent Ethyl (VASCEPA) 1 g CAPS Take 2 capsules (2 g total) by mouth 2 (two) times daily.  ? ipratropium (ATROVENT) 0.03 % nasal spray Place 2 sprays into both nostrils every 12 (twelve) hours.  ? ipratropium-albuterol (DUONEB) 0.5-2.5 (3) MG/3ML SOLN INHALE 1 VIAL VIA NEBULIZATION EVERY 6 HOURS AS NEEDED  ? isosorbide mononitrate (IMDUR) 30 MG 24 hr tablet TAKE 1/2 TABLET BY MOUTH DAILY  ? ketoconazole (NIZORAL) 2 % shampoo Apply topically 2 (two) times a week.  ? lisinopril (ZESTRIL) 10 MG tablet TAKE 1 TABLET (10 MG TOTAL) BY MOUTH DAILY.  ? meloxicam (MOBIC) 7.5 MG tablet TAKE 1 TABLET DAILY AS NEEDED FOR PAIN  ? metFORMIN (GLUCOPHAGE-XR) 500 MG 24 hr tablet Take 2 tablets (1,000 mg total) by mouth daily with breakfast.  ? metoprolol tartrate (LOPRESSOR) 50 MG tablet TAKE 1 TABLET (50 MG TOTAL) BY MOUTH 2 (TWO) TIMES DAILY. (Patient taking differently: Take 50 mg by mouth 2 (two) times daily. Take an additional 1/2 tablet on the days  you have increased palpitations)  ? montelukast (SINGULAIR) 10 MG tablet TAKE 1 TABLET AT BEDTIME  ? Multiple Vitamin (MULTIVITAMIN WITH MINERALS) TABS tablet Take 1 tablet by mouth daily.  ? nitroGLYCERIN (NITROSTAT) 0.4 MG SL tablet Place 1 tablet (0.4 mg total) under the tongue every 5 (five) minutes as needed for chest pain.  ? ONETOUCH VERIO test strip CHECK BLOOD SUGAR 3 TIMES DAILY  ? pantoprazole (PROTONIX) 40 MG tablet Take 1 tablet (40 mg total) by mouth daily.  ? silver sulfADIAZINE (SILVADENE) 1 % cream Apply 1 application topically daily.  ? tamsulosin (FLOMAX) 0.4 MG CAPS capsule TAKE 1 CAPSULE (0.4 MG TOTAL)  BY MOUTH DAILY.  ? tiZANidine (ZANAFLEX) 2 MG tablet TAKE 1 TABLET EVERY 6 HOURS AS NEEDED FOR MUSCLE SPASM(S)  ? traMADol (ULTRAM) 50 MG tablet Take 1 tablet (50 mg total) by mouth 2 (two) times daily.  ? traZODone (DESYREL) 100 MG tablet TAKE 1 TABLET AT BEDTIME  ? zolpidem (AMBIEN) 10 MG tablet TAKE 1 TABLET AT BEDTIME AS NEEDED FOR SLEEP  ?  ? ?Allergies:   Benadryl [diphenhydramine hcl]  ? ?Social History  ? ?Socioeconomic History  ? Marital status: Married  ?  Spouse name: Not on file  ? Number of children: Not on file  ? Years of education: Not on file  ? Highest education level: Not on file  ?Occupational History  ? Not on file  ?Tobacco Use  ? Smoking status: Never  ? Smokeless tobacco: Never  ?Vaping Use  ? Vaping Use: Never used  ?Substance and Sexual Activity  ? Alcohol use: No  ? Drug use: No  ? Sexual activity: Yes  ?Other Topics Concern  ? Not on file  ?Social History Narrative  ? ** Merged History Encounter **  ?    ? Holter monitor 08/2010: PVCs and sinus tachy.  ? Sleep Study (02/2008): mild sleep apnea, no indication for CPAP.  ? ?Social Determinants of Health  ? ?Financial Resource Strain: Medium Risk  ? Difficulty of Paying Living Expenses: Somewhat hard  ?Food Insecurity: Food Insecurity Present  ? Worried About Charity fundraiser in the Last Year: Sometimes true  ? Ran Out  of Food in the Last Year: Sometimes true  ?Transportation Needs: No Transportation Needs  ? Lack of Transportation (Medical): No  ? Lack of Transportation (Non-Medical): No  ?Physical Activity: Insufficiently Active  ? Days

## 2021-12-22 NOTE — Patient Instructions (Addendum)
Medication Instructions:  ?Take an additional 1/2 tablet of Metoprolol on the days you have increased palpitations ? ?*If you need a refill on your cardiac medications before your next appointment, please call your pharmacy* ? ? ?Lab Work: ?NONE ordered at this time of appointment  ? ?If you have labs (blood work) drawn today and your tests are completely normal, you will receive your results only by: ?MyChart Message (if you have MyChart) OR ?A paper copy in the mail ?If you have any lab test that is abnormal or we need to change your treatment, we will call you to review the results. ? ? ?Testing/Procedures: ?NONE ordered at this time of appointment  ? ? ?Follow-Up: ?At [facility], you and your health needs are our priority.  As part of our continuing mission to provide you with exceptional heart care, we have created designated Provider Care Teams.  These Care Teams include your primary Cardiologist (physician) and Advanced Practice Providers (APPs -  Physician Assistants and Nurse Practitioners) who all work together to provide you with the care you need, when you need it. ? ?We recommend signing up for the patient portal called "MyChart".  Sign up information is provided on this After Visit Summary.  MyChart is used to connect with patients for Virtual Visits (Telemedicine).  Patients are able to view lab/test results, encounter notes, upcoming appointments, etc.  Non-urgent messages can be sent to your provider as well.   ?To learn more about what you can do with MyChart, go to [email or web address].   ? ?Your next appointment:   ?1 year(s) ? ?The format for your next appointment:   ?In Person ? ?Provider:   ?Quay Burow, MD   ? ? ?Other Instructions ? ? ?

## 2021-12-24 ENCOUNTER — Encounter: Payer: Self-pay | Admitting: Physician Assistant

## 2021-12-28 ENCOUNTER — Ambulatory Visit (INDEPENDENT_AMBULATORY_CARE_PROVIDER_SITE_OTHER): Payer: Medicare HMO | Admitting: Family

## 2021-12-28 ENCOUNTER — Telehealth: Payer: Self-pay | Admitting: Family

## 2021-12-28 ENCOUNTER — Encounter: Payer: Medicare HMO | Admitting: Registered Nurse

## 2021-12-28 VITALS — BP 99/77 | HR 61 | Temp 98.2°F | Resp 16 | Wt 201.0 lb

## 2021-12-28 DIAGNOSIS — E785 Hyperlipidemia, unspecified: Secondary | ICD-10-CM | POA: Diagnosis not present

## 2021-12-28 DIAGNOSIS — I25119 Atherosclerotic heart disease of native coronary artery with unspecified angina pectoris: Secondary | ICD-10-CM

## 2021-12-28 DIAGNOSIS — H3552 Pigmentary retinal dystrophy: Secondary | ICD-10-CM | POA: Diagnosis not present

## 2021-12-28 DIAGNOSIS — E1142 Type 2 diabetes mellitus with diabetic polyneuropathy: Secondary | ICD-10-CM

## 2021-12-28 DIAGNOSIS — I10 Essential (primary) hypertension: Secondary | ICD-10-CM

## 2021-12-28 DIAGNOSIS — J4540 Moderate persistent asthma, uncomplicated: Secondary | ICD-10-CM | POA: Diagnosis not present

## 2021-12-28 DIAGNOSIS — R351 Nocturia: Secondary | ICD-10-CM | POA: Diagnosis not present

## 2021-12-28 DIAGNOSIS — N401 Enlarged prostate with lower urinary tract symptoms: Secondary | ICD-10-CM | POA: Diagnosis not present

## 2021-12-28 DIAGNOSIS — F418 Other specified anxiety disorders: Secondary | ICD-10-CM

## 2021-12-28 LAB — COMPREHENSIVE METABOLIC PANEL
ALT: 24 U/L (ref 0–53)
AST: 20 U/L (ref 0–37)
Albumin: 4.1 g/dL (ref 3.5–5.2)
Alkaline Phosphatase: 97 U/L (ref 39–117)
BUN: 13 mg/dL (ref 6–23)
CO2: 29 mEq/L (ref 19–32)
Calcium: 8.9 mg/dL (ref 8.4–10.5)
Chloride: 103 mEq/L (ref 96–112)
Creatinine, Ser: 0.82 mg/dL (ref 0.40–1.50)
GFR: 101.98 mL/min (ref >60.00)
Glucose, Bld: 105 mg/dL — ABNORMAL HIGH (ref 70–99)
Potassium: 4.2 mEq/L (ref 3.5–5.1)
Sodium: 139 mEq/L (ref 135–145)
Total Bilirubin: 0.6 mg/dL (ref 0.2–1.2)
Total Protein: 6.4 g/dL (ref 6.0–8.3)

## 2021-12-28 LAB — LIPID PANEL
Cholesterol: 97 mg/dL (ref 0–200)
HDL: 40.9 mg/dL (ref >39.00)
LDL Cholesterol: 33 mg/dL (ref 0–99)
NonHDL: 55.96
Total CHOL/HDL Ratio: 2
Triglycerides: 113 mg/dL (ref 0.0–149.0)
VLDL: 22.6 mg/dL (ref 0.0–40.0)

## 2021-12-28 LAB — PSA: PSA: 0.63 ng/mL (ref 0.10–4.00)

## 2021-12-28 LAB — HEMOGLOBIN A1C: Hgb A1c MFr Bld: 6.8 % — ABNORMAL HIGH (ref 4.6–6.5)

## 2021-12-28 MED ORDER — DULOXETINE HCL 60 MG PO CPEP: 60.0000 mg | ORAL_CAPSULE | Freq: Every day | ORAL | 1 refills | Status: DC

## 2021-12-28 NOTE — Progress Notes (Signed)
? ?Subjective:  ? ?By signing my name below, I, [name], attest that this documentation has been prepared under the direction and in the presence of [name] NP, 12/28/2021 ? ? Patient ID: Cody Raymond, male    DOB: [birth year], 51 y.o.   MRN: [id number] ? ?Chief Complaint  ?Patient presents with  ? Diabetes  ?  Here for follow up  ? Hypertension  ?  Here for follow up  ? Dry skin problem  ?  Complains of dry skin on face  ? ? ?HPI ?Patient is in today for an office visit. ? ?Rash - He complains of a rash that appeared on the right side of his jaw. He states that he's had these symptoms many years ago. He had an appointment with his dermatologist scheduled but could not make it.  ? ?Asthma - His breathing is decreasing so he is taking his Nebulizer. He went to a specialist and was originally prescribed medication for long - lasting COVID - 19 symptoms but his insurance does not cover it.  ? ?Cardiology - He saw Mr. Eulas Post for palpitations on 12/22/2021. He reports that Mr. Eulas Post told him that there is no immediate concerns. The patient still experiences fatigue and about 1/3rd of the time he experiences sharp pain in his chest.  ? ?Vision - He saw Dr. Zadie Rhine in 01/2021 and was diagnosed with Retina Pigmentosa. The patient's daughter has retina pigmentosa as well as his daughter's maternal grandmother.  ? ?Cholesterol - As of today's visit, his cholesterol levels are good . He is currently taking 80 MG of Lipitor.  ?Lab Results  ?Component Value Date  ? CHOL 119 03/31/2021  ? HDL 41.80 03/31/2021  ? LDLCALC 51 03/31/2021  ? LDLDIRECT 36 04/19/2019  ? TRIG 129.0 03/31/2021  ? CHOLHDL 3 03/31/2021  ? ?Blood Pressure - His blood pressure is well controlled. He is taking 50 MG of Metoprolol Tartrate and 10 MG of Lisinopril.  ?BP Readings from Last 3 Encounters:  ?12/28/21 99/77  ?12/22/21 116/70  ?12/01/21 115/64  ? ?Pulse Readings from Last 3 Encounters:  ?12/28/21 61  ?12/22/21 81  ?12/01/21 76  ? ?Mood  - His mood fluctuates, he does not think that most of his down days contribute to stressful situation. He has been experiencing family situations as of lately. Also, as of today's visit, his family is experiencing different illnesses. He is currently taking 30 MG of Cymbalta. He states that walking with his daughter improves his mood. He also goes to coffee shops to help his mood.  ? ?Urination - He gets up night twice a night to urinate. He does not drink after 7 pm. He is currently taking 0.4 MG CAPD of Flomax.  ? ?COVID - 19 vaccine - He has not taken a COVID - 19 bivalent vaccine.  ? ?Health Maintenance Due  ?Topic Date Due  ? COLONOSCOPY (Pts 45-34yr Insurance coverage will need to be confirmed)  Never done  ? COVID-19 Vaccine (4 - Booster for [place]) 10/13/2020  ? Zoster Vaccines- Shingrix (1 of 2) Never done  ? HEMOGLOBIN A1C  10/01/2021  ? ? ?Past Medical History:  ?Diagnosis Date  ? Aneurysm ([place]   ? Asthma   ? Blood transfusion   ? CAD (coronary artery disease), non obstructive on cath 2011 04/05/2012  ? Coronary artery disease   ? Diabetes mellitus   ? Diabetes mellitus without complication ([place]   ? Enlarged prostate   ? GERD (gastroesophageal reflux  disease)   ? H/O syncope   ? Heart disease   ? History of repair of patent ductus arteriosus 07/23/2016  ? Hyperlipidemia   ? Hypertension   ? Neuromuscular disorder ([place])   ? DJD  ? Refusal of blood transfusions as patient is Jehovah's Witness   ? ? ?Past Surgical History:  ?Procedure Laterality Date  ? APPENDECTOMY    ? BACK SURGERY    ? CARDIAC CATHETERIZATION  2007  ? Clean Cardiac Cath [facility] Cards), with RCA 30% narrowing likely due to catheter induced spasm in 09/02/10.  ? CARDIAC CATHETERIZATION  09/02/2010  ? mod. nonobstructive disease in the RCA and CX, tortuous LAD  ? COLONOSCOPY W/ POLYPECTOMY  03/17/11  ? diminutive polyp  ? LOOP RECORDER EXPLANT N/A 01/14/2014  ? Procedure: LOOP RECORDER EXPLANT;  Surgeon: Sanda Klein, MD;   Location: [place] CATH LAB;  Service: Cardiovascular;  Laterality: N/A;  ? LOOP RECORDER IMPLANT  04/06/2012  ? Reveal XT 4529  ? LOOP RECORDER IMPLANT N/A 04/06/2012  ? Procedure: LOOP RECORDER IMPLANT;  Surgeon: Sanda Klein, MD;  Location: [place] CATH LAB;  Service: Cardiovascular;  Laterality: N/A;  ? NM MYOCAR PERF WALL MOTION  01/25/2008  ? mild anteroapical wall ischemia  ? PATENT DUCTUS ARTERIOUS REPAIR    ? at age 66  ? ? ?Family History  ?Problem Relation Age of Onset  ? Colon cancer Paternal Grandfather   ? Prostate cancer Paternal Grandfather   ? Aneurysm Father   ? Heart attack Father 73  ? Coronary artery disease Father   ? Colon polyps Mother   ? Heart disease Mother   ? Coronary artery disease Mother   ? Migraines Mother   ? Breast cancer Maternal Grandmother   ? Colon cancer Paternal Uncle   ?     x 2  ? Stomach cancer Brother   ? Liver disease Other   ?     unsure who it was  ? Allergies Daughter   ? ? ?Social History  ? ?Socioeconomic History  ? Marital status: Married  ?  Spouse name: Not on file  ? Number of children: Not on file  ? Years of education: Not on file  ? Highest education level: Not on file  ?Occupational History  ? Not on file  ?Tobacco Use  ? Smoking status: Never  ? Smokeless tobacco: Never  ?Vaping Use  ? Vaping Use: Never used  ?Substance and Sexual Activity  ? Alcohol use: No  ? Drug use: No  ? Sexual activity: Yes  ?Other Topics Concern  ? Not on file  ?Social History Narrative  ? ** Merged History Encounter **  ?    ? Holter monitor 08/2010: PVCs and sinus tachy.  ? Sleep Study (02/2008): mild sleep apnea, no indication for CPAP.  ? ?Social Determinants of Health  ? ?Financial Resource Strain: Medium Risk  ? Difficulty of Paying Living Expenses: Somewhat hard  ?Food Insecurity: Food Insecurity Present  ? Worried About Charity fundraiser in the Last Year: Sometimes true  ? Ran Out of Food in the Last Year: Sometimes true  ?Transportation Needs: No Transportation Needs  ? Lack of  Transportation (Medical): No  ? Lack of Transportation (Non-Medical): No  ?Physical Activity: Insufficiently Active  ? Days of Exercise per Week: 3 days  ? Minutes of Exercise per Session: 20 min  ?Stress: Stress Concern Present  ? Feeling of Stress : To some extent  ?Social Connections: Moderately Integrated  ?  Frequency of Communication with Friends and Family: More than three times a week  ? Frequency of Social Gatherings with Friends and Family: More than three times a week  ? Attends Religious Services: More than 4 times per year  ? Active Member of Clubs or Organizations: No  ? Attends Archivist Meetings: Never  ? Marital Status: Married  ?Intimate Partner Violence: Not At Risk  ? Fear of Current or Ex-Partner: No  ? Emotionally Abused: No  ? Physically Abused: No  ? Sexually Abused: No  ? ? ?Outpatient Medications Prior to Visit  ?Medication Sig Dispense Refill  ? Ascorbic Acid (VITAMIN C) 1000 MG tablet Take 1,000 mg by mouth daily.    ? aspirin EC 81 MG EC tablet Take 1 tablet (81 mg total) by mouth daily. 30 tablet 0  ? atorvastatin (LIPITOR) 80 MG tablet TAKE 1 TABLET (80 MG TOTAL) BY MOUTH DAILY. 90 tablet 0  ? betamethasone dipropionate (DIPROLENE) 0.05 % cream Apply topically 2 (two) times daily. 30 g 0  ? cetirizine (ZYRTEC) 10 MG tablet Take 10 mg by mouth daily.    ? clonazePAM (KLONOPIN) 1 MG tablet TAKE 1/2 TABLET EVERY MORNING AND TAKE 1 TABLET EVERY EVENING 45 tablet 0  ? ezetimibe (ZETIA) 10 MG tablet TAKE 1 TABLET EVERY DAY 90 tablet 1  ? fluticasone (FLONASE) 50 MCG/ACT nasal spray USE 2 SPRAYS IN EACH NOSTRIL ONE TIME DAILY 16 g 1  ? fluticasone-salmeterol (WIXELA INHUB) 250-50 MCG/ACT AEPB Inhale 1 puff into the lungs in the morning and at bedtime. 60 each 6  ? gabapentin (NEURONTIN) 300 MG capsule TAKE 1 CAPSULE AT 6AM, 11:30AM, 4:30PM. TAKE 3 CAPSULES AT 10PM 540 capsule 3  ? Icosapent Ethyl (VASCEPA) 1 g CAPS Take 2 capsules (2 g total) by mouth 2 (two) times daily. 120  capsule 0  ? ipratropium (ATROVENT) 0.03 % nasal spray Place 2 sprays into both nostrils every 12 (twelve) hours. 30 mL 12  ? ipratropium-albuterol (DUONEB) 0.5-2.5 (3) MG/3ML SOLN INHALE 1 VIAL VIA NEBULIZATION EVERY

## 2021-12-28 NOTE — Assessment & Plan Note (Signed)
Patient is followed by retina specialist.   ?

## 2021-12-28 NOTE — Patient Instructions (Addendum)
Please complete lab work prior to leaving.  ?Increase cymbalta from '30mg'$  to '60mg'$ .  ?Please schedule an appointment with a counselor.  ?

## 2021-12-28 NOTE — Telephone Encounter (Signed)
Please update cologuard results in epic health maintenance. Results are in [place].  ?

## 2021-12-28 NOTE — Assessment & Plan Note (Signed)
Lab Results  ?Component Value Date  ? CHOL 119 03/31/2021  ? HDL 41.80 03/31/2021  ? LDLCALC 51 03/31/2021  ? LDLDIRECT 36 04/19/2019  ? TRIG 129.0 03/31/2021  ? CHOLHDL 3 03/31/2021  ? ?Was at goal last year. Continue atorvastatin '80mg'$ .  ? ?

## 2021-12-28 NOTE — Assessment & Plan Note (Signed)
Fair control on flomax, continue same. Recheck PSA.  ?

## 2021-12-28 NOTE — Assessment & Plan Note (Signed)
Fair control, following with pulmonology.  Continues advair, albuterol nebs.  ?

## 2021-12-28 NOTE — Assessment & Plan Note (Signed)
Notes intermittent depression symptoms.  Will increase cymbalta from '30mg'$  to '60mg'$ .  ?

## 2021-12-28 NOTE — Assessment & Plan Note (Addendum)
BP Readings from Last 3 Encounters:  ?12/28/21 99/77  ?12/22/21 116/70  ?12/01/21 115/64  ? ?BP at goal. Continue metoprolol and lisinopril.  ?

## 2021-12-28 NOTE — Assessment & Plan Note (Signed)
Up to date with cardiology.  CT performed on 02/26/2019 revealed coronary calcium score of 0, completely normal coronary arteries

## 2021-12-28 NOTE — Assessment & Plan Note (Signed)
Lab Results  ?Component Value Date  ? HGBA1C 6.1 03/31/2021  ? HGBA1C 5.5 05/18/2020  ? HGBA1C 5.8 08/21/2019  ? ?Lab Results  ?Component Value Date  ? MICROALBUR 1.15 11/20/2013  ? LDLCALC 51 03/31/2021  ? CREATININE 0.89 03/31/2021  ? ?Clinically stable on metformin. Continue same.  ?

## 2021-12-29 ENCOUNTER — Encounter: Payer: Medicare HMO | Attending: Registered Nurse | Admitting: Registered Nurse

## 2021-12-29 VITALS — BP 93/60 | HR 75 | Ht 68.0 in | Wt 199.0 lb

## 2021-12-29 DIAGNOSIS — Z5181 Encounter for therapeutic drug level monitoring: Secondary | ICD-10-CM | POA: Insufficient documentation

## 2021-12-29 DIAGNOSIS — M961 Postlaminectomy syndrome, not elsewhere classified: Secondary | ICD-10-CM | POA: Diagnosis not present

## 2021-12-29 DIAGNOSIS — M5416 Radiculopathy, lumbar region: Secondary | ICD-10-CM | POA: Diagnosis not present

## 2021-12-29 DIAGNOSIS — Z79891 Long term (current) use of opiate analgesic: Secondary | ICD-10-CM | POA: Insufficient documentation

## 2021-12-29 DIAGNOSIS — G894 Chronic pain syndrome: Secondary | ICD-10-CM | POA: Insufficient documentation

## 2021-12-29 DIAGNOSIS — M47816 Spondylosis without myelopathy or radiculopathy, lumbar region: Secondary | ICD-10-CM | POA: Diagnosis not present

## 2021-12-29 DIAGNOSIS — M79671 Pain in right foot: Secondary | ICD-10-CM | POA: Insufficient documentation

## 2021-12-29 DIAGNOSIS — M62830 Muscle spasm of back: Secondary | ICD-10-CM | POA: Insufficient documentation

## 2021-12-29 NOTE — Telephone Encounter (Signed)
done

## 2021-12-29 NOTE — Progress Notes (Signed)
? ?Subjective:  ? ? Patient ID: Cody Raymond, male    DOB: Oct 27, 1970, 51 y.o.   MRN: [id number] ? ?HPI: Cody Raymond is a 51 y.o. male who returns for follow up appointment for chronic pain and medication refill. He states his  pain is located in his lower back radiating into his right lower extremity and right foot with tingling and burning. He rates his pain 7. His current exercise regime is walking and performing stretching exercises. ? ?Mr. Indelicato Morphine equivalent is 8.33 MME. He  is also prescribed Clonazepam  by [name] NP .We have discussed the black box warning of using opioids and benzodiazepines. I highlighted the dangers of using these drugs together and discussed the adverse events including respiratory suppression, overdose, cognitive impairment and importance of compliance with current regimen. We will continue to monitor and adjust as indicated. ? ? UDS ordered today.  ?  ? ?Pain Inventory ?Average Pain 7 ?Pain Right Now 7 ?My pain is sharp, burning, dull, stabbing, tingling, and aching ? ?In the last 24 hours, has pain interfered with the following? ?General activity 3 ?Relation with others 4 ?Enjoyment of life 4 ?What TIME of day is your pain at its worst? morning , daytime, evening, and night ?Sleep (in general) Fair ? ?Pain is worse with: walking, bending, sitting, inactivity, standing, and some activites ?Pain improves with: rest, heat/ice, and medication ?Relief from Meds: 5 ? ?Family History  ?Problem Relation Age of Onset  ? Colon cancer Paternal Grandfather   ? Prostate cancer Paternal Grandfather   ? Aneurysm Father   ? Heart attack Father 33  ? Coronary artery disease Father   ? Colon polyps Mother   ? Heart disease Mother   ? Coronary artery disease Mother   ? Migraines Mother   ? Breast cancer Maternal Grandmother   ? Colon cancer Paternal Uncle   ?     x 2  ? Stomach cancer Brother   ? Liver disease Other   ?     unsure who it was  ? Allergies Daughter   ? ?Social  History  ? ?Socioeconomic History  ? Marital status: Married  ?  Spouse name: Not on file  ? Number of children: Not on file  ? Years of education: Not on file  ? Highest education level: Not on file  ?Occupational History  ? Not on file  ?Tobacco Use  ? Smoking status: Never  ? Smokeless tobacco: Never  ?Vaping Use  ? Vaping Use: Never used  ?Substance and Sexual Activity  ? Alcohol use: No  ? Drug use: No  ? Sexual activity: Yes  ?Other Topics Concern  ? Not on file  ?Social History Narrative  ? ** Merged History Encounter **  ?    ? Holter monitor 08/2010: PVCs and sinus tachy.  ? Sleep Study (02/2008): mild sleep apnea, no indication for CPAP.  ? ?Social Determinants of Health  ? ?Financial Resource Strain: Medium Risk  ? Difficulty of Paying Living Expenses: Somewhat hard  ?Food Insecurity: Food Insecurity Present  ? Worried About Charity fundraiser in the Last Year: Sometimes true  ? Ran Out of Food in the Last Year: Sometimes true  ?Transportation Needs: No Transportation Needs  ? Lack of Transportation (Medical): No  ? Lack of Transportation (Non-Medical): No  ?Physical Activity: Insufficiently Active  ? Days of Exercise per Week: 3 days  ? Minutes of Exercise per Session: 20 min  ?Stress: Stress Concern Present  ?  Feeling of Stress : To some extent  ?Social Connections: Moderately Integrated  ? Frequency of Communication with Friends and Family: More than three times a week  ? Frequency of Social Gatherings with Friends and Family: More than three times a week  ? Attends Religious Services: More than 4 times per year  ? Active Member of Clubs or Organizations: No  ? Attends Archivist Meetings: Never  ? Marital Status: Married  ? ?Past Surgical History:  ?Procedure Laterality Date  ? APPENDECTOMY    ? BACK SURGERY    ? CARDIAC CATHETERIZATION  2007  ? Clean Cardiac Cath [facility] Cards), with RCA 30% narrowing likely due to catheter induced spasm in 09/02/10.  ? CARDIAC CATHETERIZATION   09/02/2010  ? mod. nonobstructive disease in the RCA and CX, tortuous LAD  ? COLONOSCOPY W/ POLYPECTOMY  03/17/11  ? diminutive polyp  ? LOOP RECORDER EXPLANT N/A 01/14/2014  ? Procedure: LOOP RECORDER EXPLANT;  Surgeon: Sanda Klein, MD;  Location: [place] CATH LAB;  Service: Cardiovascular;  Laterality: N/A;  ? LOOP RECORDER IMPLANT  04/06/2012  ? Reveal XT 4529  ? LOOP RECORDER IMPLANT N/A 04/06/2012  ? Procedure: LOOP RECORDER IMPLANT;  Surgeon: Sanda Klein, MD;  Location: [place] CATH LAB;  Service: Cardiovascular;  Laterality: N/A;  ? NM MYOCAR PERF WALL MOTION  01/25/2008  ? mild anteroapical wall ischemia  ? PATENT DUCTUS ARTERIOUS REPAIR    ? at age 73  ? ?Past Surgical History:  ?Procedure Laterality Date  ? APPENDECTOMY    ? BACK SURGERY    ? CARDIAC CATHETERIZATION  2007  ? Clean Cardiac Cath [facility] Cards), with RCA 30% narrowing likely due to catheter induced spasm in 09/02/10.  ? CARDIAC CATHETERIZATION  09/02/2010  ? mod. nonobstructive disease in the RCA and CX, tortuous LAD  ? COLONOSCOPY W/ POLYPECTOMY  03/17/11  ? diminutive polyp  ? LOOP RECORDER EXPLANT N/A 01/14/2014  ? Procedure: LOOP RECORDER EXPLANT;  Surgeon: Sanda Klein, MD;  Location: [place] CATH LAB;  Service: Cardiovascular;  Laterality: N/A;  ? LOOP RECORDER IMPLANT  04/06/2012  ? Reveal XT 4529  ? LOOP RECORDER IMPLANT N/A 04/06/2012  ? Procedure: LOOP RECORDER IMPLANT;  Surgeon: Sanda Klein, MD;  Location: [place] CATH LAB;  Service: Cardiovascular;  Laterality: N/A;  ? NM MYOCAR PERF WALL MOTION  01/25/2008  ? mild anteroapical wall ischemia  ? PATENT DUCTUS ARTERIOUS REPAIR    ? at age 56  ? ?Past Medical History:  ?Diagnosis Date  ? Aneurysm ([place])   ? Asthma   ? Blood transfusion   ? CAD (coronary artery disease), non obstructive on cath 2011 04/05/2012  ? Coronary artery disease   ? Diabetes mellitus   ? Diabetes mellitus without complication ([place])   ? Enlarged prostate   ? GERD (gastroesophageal reflux disease)   ? H/O syncope   ? Heart disease    ? History of repair of patent ductus arteriosus 07/23/2016  ? Hyperlipidemia   ? Hypertension   ? Neuromuscular disorder ([place])   ? DJD  ? Refusal of blood transfusions as patient is Jehovah's Witness   ? ?BP 93/60   Pulse 75   Ht '5\' 8"'$  (1.727 m)   Wt 199 lb (90.3 kg)   SpO2 97%   BMI 30.26 kg/m?  ? ?Opioid Risk Score:   ?Fall Risk Score:  `1 ? ?Depression screen PHQ 2/9 ? ? ?  12/28/2021  ? 10:27 AM 12/06/2021  ? 11:05 AM 06/29/2021  ?  9:79 AM 06/22/2021  ?  3:52 PM 05/18/2020  ? 11:02 AM 04/15/2020  ?  1:02 PM 08/21/2019  ?  9:16 AM  ?Depression screen PHQ 2/9  ?Decreased Interest 2 0 '1 2 1 1 '$ 0  ?Down, Depressed, Hopeless '2 1  2 1 1 '$ 0  ?PHQ - 2 Score '4 1 1 4 2 2 '$ 0  ?Altered sleeping '2   2 1 '$ 0   ?Tired, decreased energy '2   1 1 1   '$ ?Change in appetite '1   1 1 '$ 0   ?Feeling bad or failure about yourself  1   0 0 0   ?Trouble concentrating 1   0 0 0   ?Moving slowly or fidgety/restless 0   1 1 0   ?Suicidal thoughts 0   0 0 0   ?PHQ-9 Score '11   9 6 3   '$ ?Difficult doing work/chores    Somewhat difficult Not difficult at all Not difficult at all   ?  ? ?Review of Systems  ?Musculoskeletal:  Positive for back pain.  ?     Right leg pain  ?All other systems reviewed and are negative. ? ?   ?Objective:  ? Physical Exam ?Vitals and nursing note reviewed.  ?Constitutional:   ?   Appearance: Normal appearance.  ?Cardiovascular:  ?   Rate and Rhythm: Normal rate and regular rhythm.  ?   Pulses: Normal pulses.  ?   Heart sounds: Normal heart sounds.  ?Pulmonary:  ?   Effort: Pulmonary effort is normal.  ?   Breath sounds: Normal breath sounds.  ?Musculoskeletal:  ?   Cervical back: Normal range of motion and neck supple.  ?   Comments: Normal Muscle Bulk and Muscle Testing Reveals:  ?Upper Extremities: Full ROM and Muscle Strength 5/5 ? Lumbar Paraspinal Tenderness: L-3-L-5 ?Lower Extremities: Full ROM and Muscle Strength 5/5 ?Right Lower Extremity Flexion Produces Pain in his right foot ?Arises from Table slowly using cane  for support ?Narrow Based  Gait  ?   ?Skin: ?   General: Skin is warm and dry.  ?Neurological:  ?   Mental Status: He is alert and oriented to person, place, and time.  ?Psychiatric:     ?   Mood and Affect: Mood

## 2021-12-30 DIAGNOSIS — Z79891 Long term (current) use of opiate analgesic: Secondary | ICD-10-CM | POA: Diagnosis not present

## 2021-12-30 DIAGNOSIS — G894 Chronic pain syndrome: Secondary | ICD-10-CM | POA: Diagnosis not present

## 2021-12-30 DIAGNOSIS — Z5181 Encounter for therapeutic drug level monitoring: Secondary | ICD-10-CM | POA: Diagnosis not present

## 2021-12-31 ENCOUNTER — Encounter: Payer: Self-pay | Admitting: Registered Nurse

## 2022-01-06 ENCOUNTER — Telehealth: Payer: Self-pay | Admitting: *Deleted

## 2022-01-06 LAB — TOXASSURE SELECT,+ANTIDEPR,UR

## 2022-01-06 NOTE — Telephone Encounter (Signed)
Urine drug screen for this encounter is consistent for prescribed medication 

## 2022-01-18 IMAGING — CR DG CHEST 2V
2 series · 2 of 2 positions shown · non-contrast
Comparison: 05/27/2019

CLINICAL DATA: Right-sided chest pain

EXAM:
CHEST - 2 VIEW

[w chest pa]
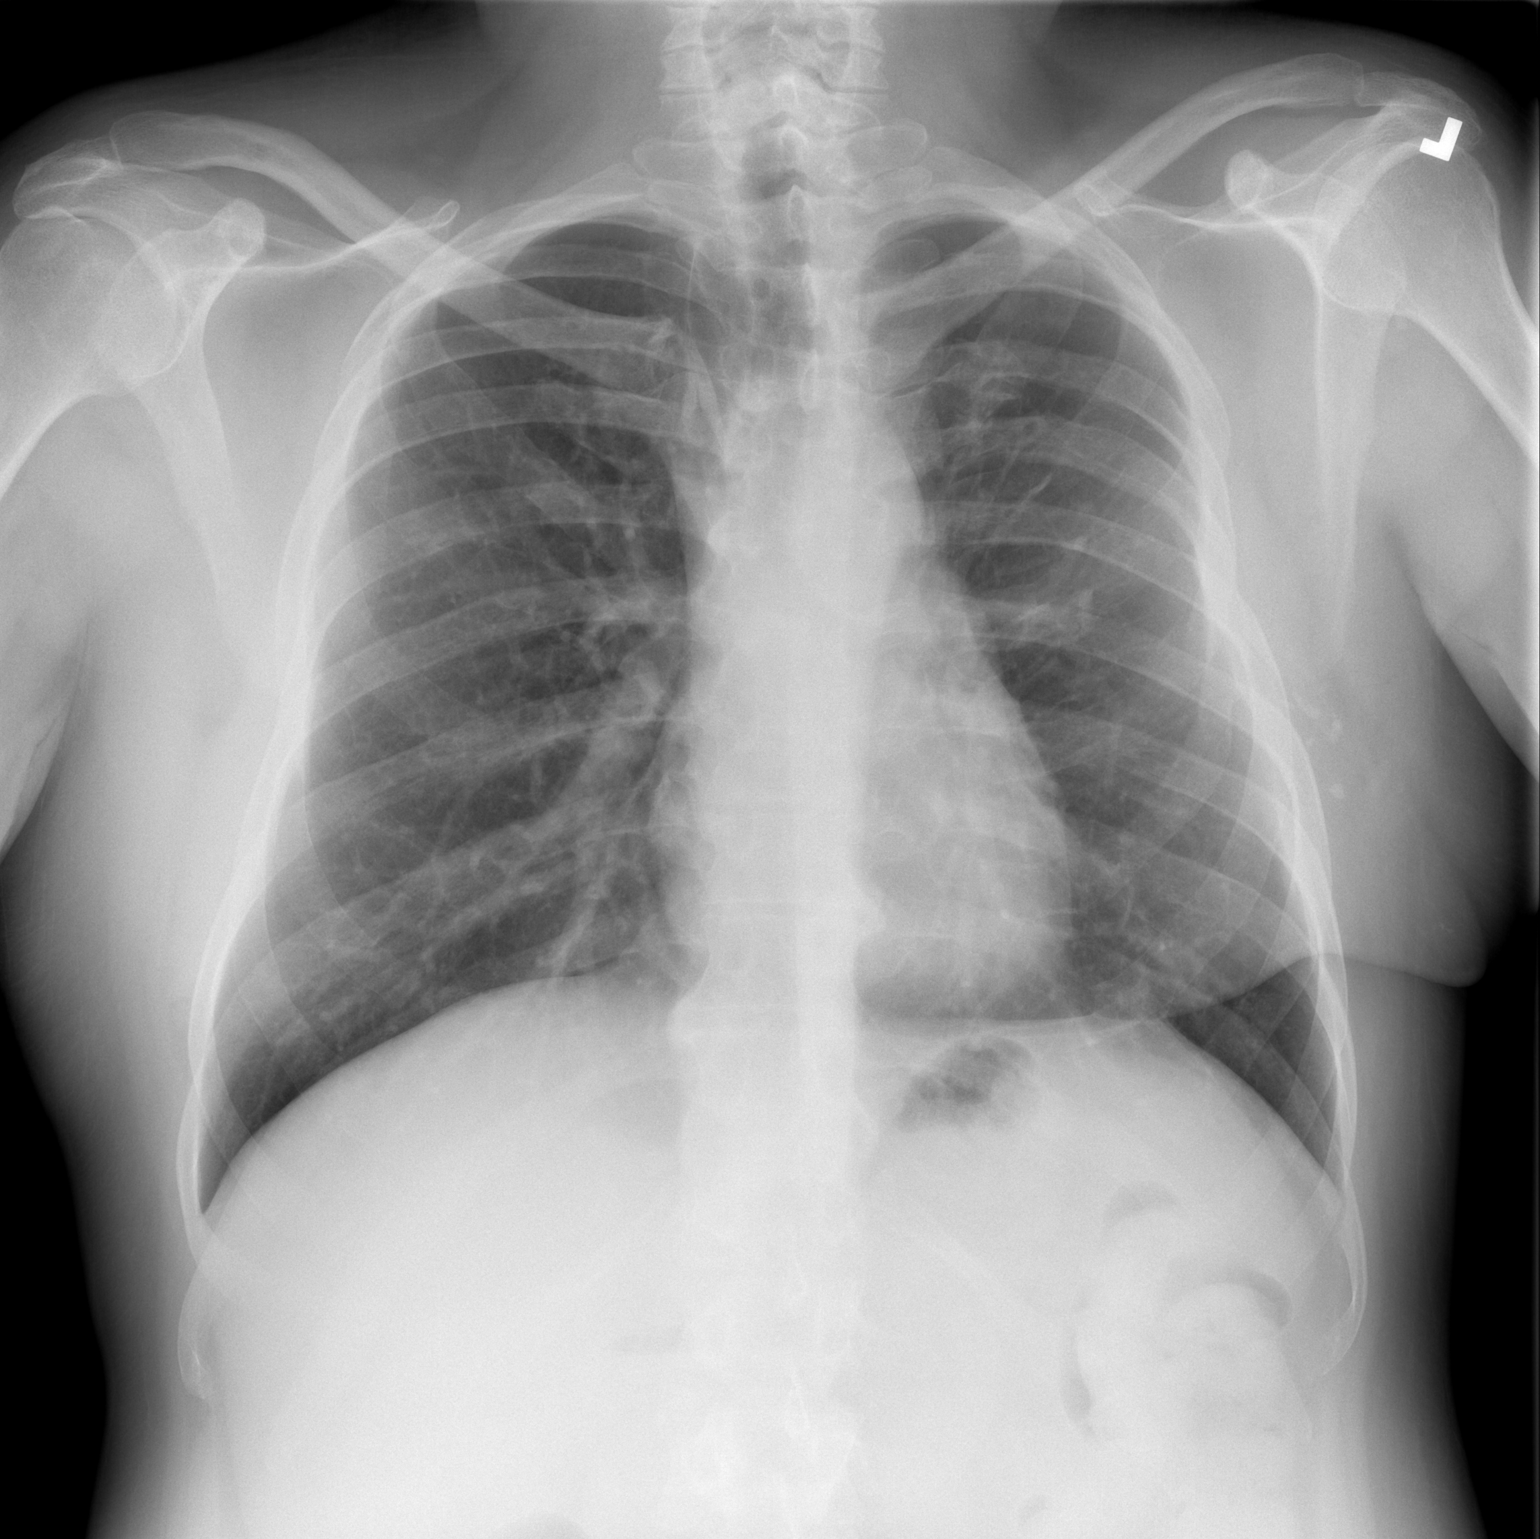

[w chest lat]
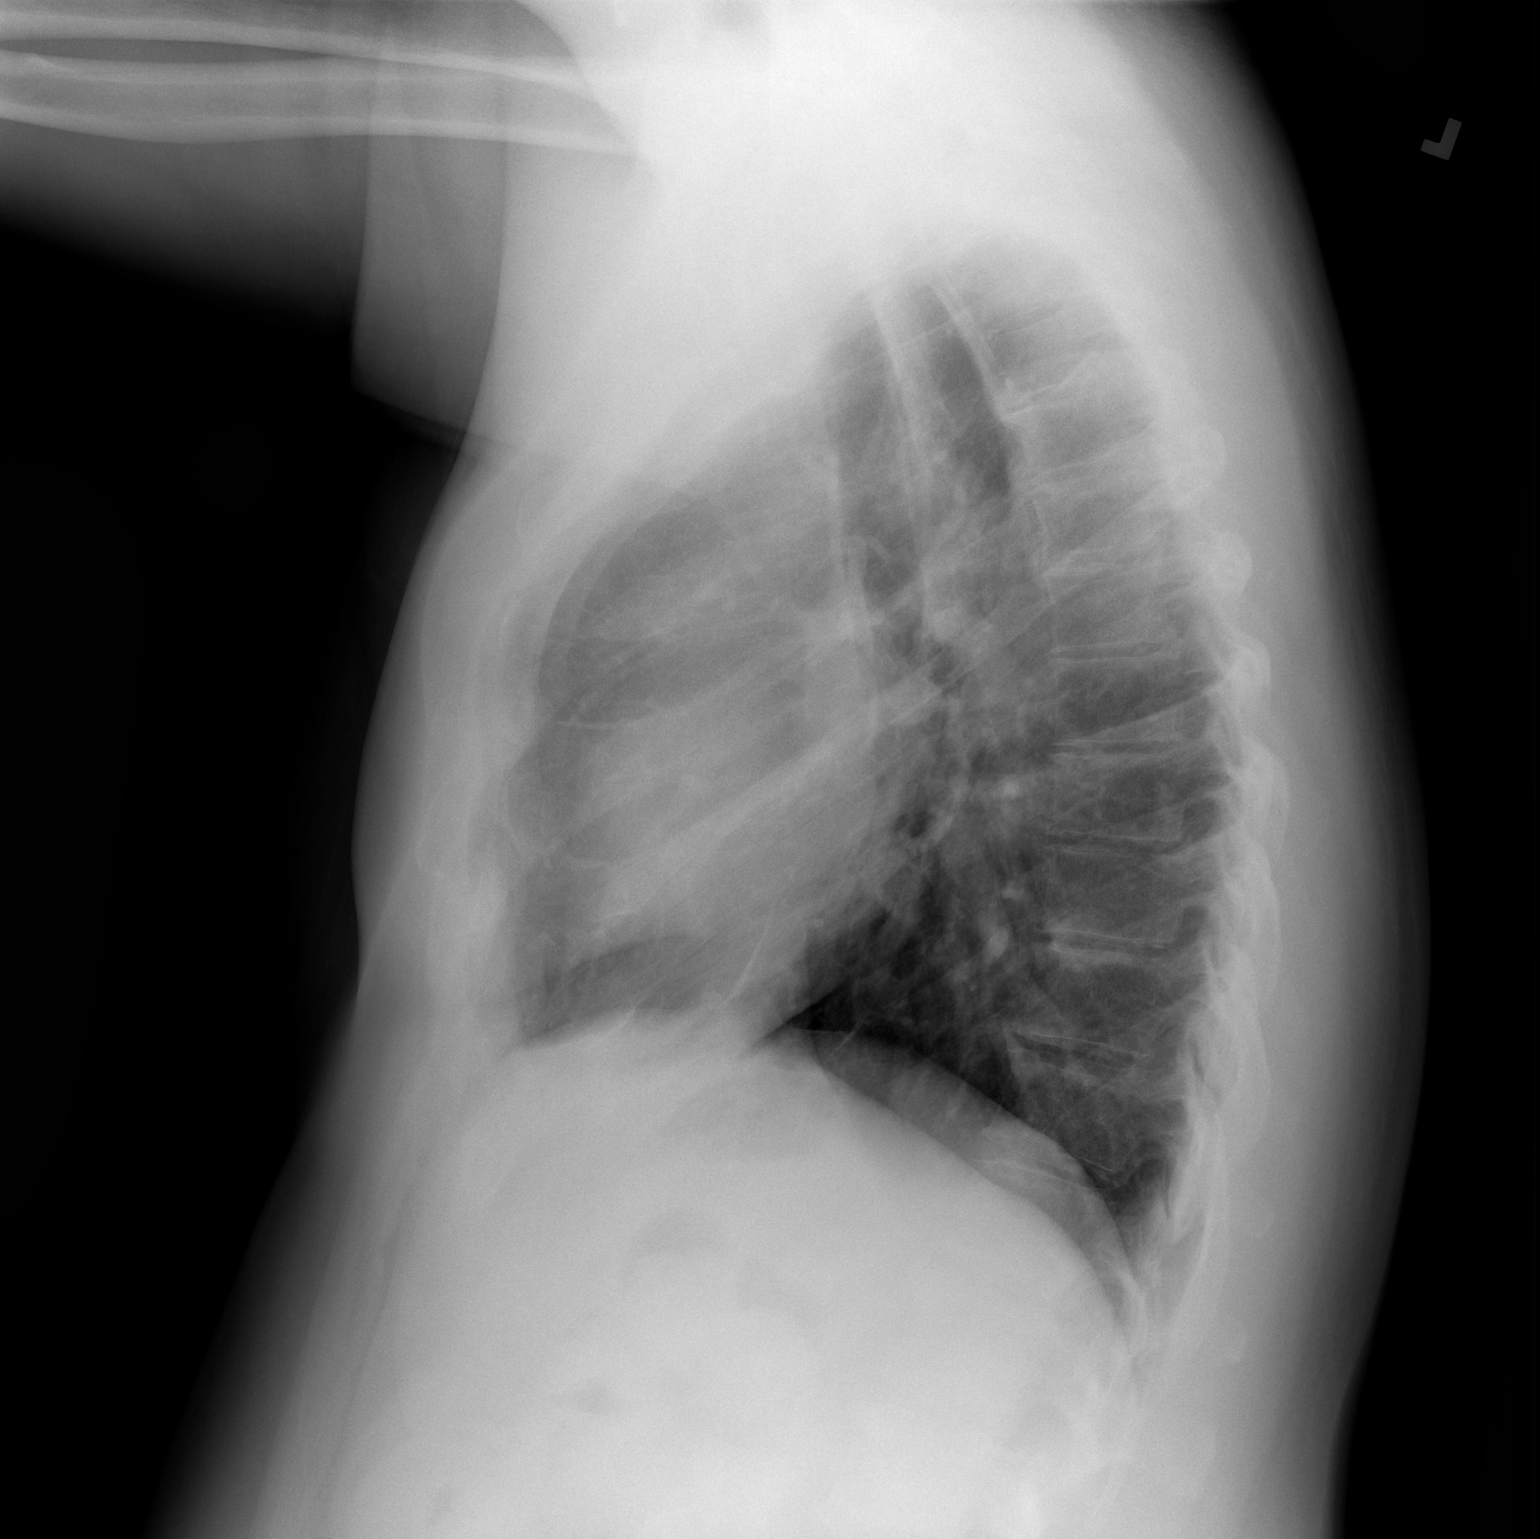

[2 of 2 positions shown; findings below may reference images not displayed]

FINDINGS: No focal opacity or pleural effusion. Normal cardiomediastinal
silhouette. No pneumothorax.
IMPRESSION: No active cardiopulmonary disease.

## 2022-01-19 ENCOUNTER — Other Ambulatory Visit: Payer: Self-pay | Admitting: Family

## 2022-01-30 ENCOUNTER — Encounter: Payer: Self-pay | Admitting: Family

## 2022-01-31 ENCOUNTER — Other Ambulatory Visit (HOSPITAL_BASED_OUTPATIENT_CLINIC_OR_DEPARTMENT_OTHER): Payer: Self-pay

## 2022-01-31 ENCOUNTER — Encounter: Payer: Self-pay | Admitting: Family Medicine

## 2022-01-31 ENCOUNTER — Ambulatory Visit (HOSPITAL_BASED_OUTPATIENT_CLINIC_OR_DEPARTMENT_OTHER)
Admission: RE | Admit: 2022-01-31 | Discharge: 2022-01-31 | Disposition: A | Discharge status: Home / Self Care | Payer: Medicare HMO | Source: Ambulatory Visit | Attending: Family Medicine | Admitting: Family Medicine

## 2022-01-31 ENCOUNTER — Ambulatory Visit (INDEPENDENT_AMBULATORY_CARE_PROVIDER_SITE_OTHER): Payer: Medicare HMO | Admitting: Family Medicine

## 2022-01-31 VITALS — BP 110/60 | HR 79 | Temp 98.3°F | Resp 18 | Ht 68.0 in | Wt 197.4 lb

## 2022-01-31 DIAGNOSIS — R059 Cough, unspecified: Secondary | ICD-10-CM | POA: Diagnosis not present

## 2022-01-31 DIAGNOSIS — J4540 Moderate persistent asthma, uncomplicated: Secondary | ICD-10-CM

## 2022-01-31 DIAGNOSIS — J209 Acute bronchitis, unspecified: Secondary | ICD-10-CM

## 2022-01-31 MED ORDER — PROMETHAZINE-DM 6.25-15 MG/5ML PO SYRP
5.0000 mL | ORAL_SOLUTION | Freq: Four times a day (QID) | ORAL | 0 refills | Status: DC | PRN
Start: 2022-01-31 — End: 2022-07-22
  Filled 2022-01-31: qty 118, 6d supply, fill #0

## 2022-01-31 MED ORDER — PREDNISONE 20 MG PO TABS
40.0000 mg | ORAL_TABLET | Freq: Every day | ORAL | 0 refills | Status: DC
  Filled 2022-01-31: qty 10, 5d supply, fill #0

## 2022-01-31 MED ORDER — AZITHROMYCIN 250 MG PO TABS
ORAL_TABLET | ORAL | 0 refills | Status: DC
Start: 2022-01-31 — End: 2022-02-08
  Filled 2022-01-31: qty 6, 5d supply, fill #0

## 2022-01-31 NOTE — Patient Instructions (Signed)
Acute Bronchitis, Adult ? ?Acute bronchitis is sudden inflammation of the main airways (bronchi) that come off the windpipe (trachea) in the lungs. The swelling causes the airways to get smaller and make more mucus than normal. This can make it hard to breathe and can cause coughing or noisy breathing (wheezing). ?Acute bronchitis may last several weeks. The cough may last longer. Allergies, asthma, and exposure to smoke may make the condition worse. ?What are the causes? ?This condition can be caused by germs and by substances that irritate the lungs, including: ?Cold and flu viruses. The most common cause of this condition is the virus that causes the common cold. ?Bacteria. This is less common. ?Breathing in substances that irritate the lungs, including: ?Smoke from cigarettes and other forms of tobacco. ?Dust and pollen. ?Fumes from household cleaning products, gases, or burned fuel. ?Indoor or outdoor air pollution. ?What increases the risk? ?The following factors may make you more likely to develop this condition: ?A weak body's defense system, also called the immune system. ?A condition that affects your lungs and breathing, such as asthma. ?What are the signs or symptoms? ?Common symptoms of this condition include: ?Coughing. This may bring up clear, yellow, or green mucus from your lungs (sputum). ?Wheezing. ?Runny or stuffy nose. ?Having too much mucus in your lungs (chest congestion). ?Shortness of breath. ?Aches and pains, including sore throat or chest. ?How is this diagnosed? ?This condition is usually diagnosed based on: ?Your symptoms and medical history. ?A physical exam. ?You may also have other tests, including tests to rule out other conditions, such as pneumonia. These tests include: ?A test of lung function. ?Test of a mucus sample to look for the presence of bacteria. ?Tests to check the oxygen level in your blood. ?Blood tests. ?Chest X-ray. ?How is this treated? ?Most cases of acute  bronchitis clear up over time without treatment. Your health care provider may recommend: ?Drinking more fluids to help thin your mucus so it is easier to cough up. ?Taking inhaled medicine (inhaler) to improve air flow in and out of your lungs. ?Using a vaporizer or a humidifier. These are machines that add water to the air to help you breathe better. ?Taking a medicine that thins mucus and clears congestion (expectorant). ?Taking a medicine that prevents or stops coughing (cough suppressant). ?It is notcommon to take an antibiotic medicine for this condition. ?Follow these instructions at home: ? ?Take over-the-counter and prescription medicines only as told by your health care provider. ?Use an inhaler, vaporizer, or humidifier as told by your health care provider. ?Take two teaspoons (10 mL) of honey at bedtime to lessen coughing at night. ?Drink enough fluid to keep your urine pale yellow. ?Do not use any products that contain nicotine or tobacco. These products include cigarettes, chewing tobacco, and vaping devices, such as e-cigarettes. If you need help quitting, ask your health care provider. ?Get plenty of rest. ?Return to your normal activities as told by your health care provider. Ask your health care provider what activities are safe for you. ?Keep all follow-up visits. This is important. ?How is this prevented? ?To lower your risk of getting this condition again: ?Wash your hands often with soap and water for at least 20 seconds. If soap and water are not available, use hand sanitizer. ?Avoid contact with people who have cold symptoms. ?Try not to touch your mouth, nose, or eyes with your hands. ?Avoid breathing in smoke or chemical fumes. Breathing smoke or chemical fumes will make your   condition worse. ?Get the flu shot every year. ?Contact a health care provider if: ?Your symptoms do not improve after 2 weeks. ?You have trouble coughing up the mucus. ?Your cough keeps you awake at night. ?You have a  fever. ?Get help right away if you: ?Cough up blood. ?Feel pain in your chest. ?Have severe shortness of breath. ?Faint or keep feeling like you are going to faint. ?Have a severe headache. ?Have a fever or chills that get worse. ?These symptoms may represent a serious problem that is an emergency. Do not wait to see if the symptoms will go away. Get medical help right away. Call your local emergency services (911 in the [place] Do not drive yourself to the hospital. ?Summary ?Acute bronchitis is inflammation of the main airways (bronchi) that come off the windpipe (trachea) in the lungs. The swelling causes the airways to get smaller and make more mucus than normal. ?Drinking more fluids can help thin your mucus so it is easier to cough up. ?Take over-the-counter and prescription medicines only as told by your health care provider. ?Do not use any products that contain nicotine or tobacco. These products include cigarettes, chewing tobacco, and vaping devices, such as e-cigarettes. If you need help quitting, ask your health care provider. ?Contact a health care provider if your symptoms do not improve after 2 weeks. ?This information is not intended to replace advice given to you by your health care provider. Make sure you discuss any questions you have with your health care provider. ?Document Revised: 01/06/2021 Document Reviewed: 01/06/2021 ?Elsevier Patient Education ? 2023 [employer] ? ?

## 2022-01-31 NOTE — Assessment & Plan Note (Signed)
z pak ?con't with ihalers ?pred taper and  Cough syrup  ?cxr today ?

## 2022-01-31 NOTE — Progress Notes (Signed)
? ?Subjective:  ? ?By signing my name below, I, Cody Raymond, attest that this documentation has been prepared under the direction and in the presence of Roma Schanz DO, 01/31/2022  ? ? Patient ID: Cody Raymond, male    DOB: 09/16/1971, 51 y.o.   MRN: [id number] ? ?Chief Complaint  ?Patient presents with  ? Cough  ?  Sxs started x5 days ago. Pt states having productive cough, congestion, and sore throat. Pt states using  Robitussin and Muncinex. Pt states negative COVID test this morning.   ? ? ?HPI ?Patient is in today for an office visit ? ?He complains of sore throat, persistent cough and phlegm that begun on 01/26/2022. He notes that he sees blood in his phlegm. He states that during this time of the year, he is prone to bronchitis and pneumonia. He also states that his family has been sick. He gets SOB easily and has a history of asthma. He has been taking 50 MCG/ACT of Flonase and using a saline rinse.He denies of any fever and has taken a Covid test with results being negative.  ? ? ? ?Past Medical History:  ?Diagnosis Date  ? Aneurysm ([place])   ? Asthma   ? Blood transfusion   ? CAD (coronary artery disease), non obstructive on cath 2011 04/05/2012  ? Coronary artery disease   ? Diabetes mellitus   ? Diabetes mellitus without complication ([place])   ? Enlarged prostate   ? GERD (gastroesophageal reflux disease)   ? H/O syncope   ? Heart disease   ? History of repair of patent ductus arteriosus 07/23/2016  ? Hyperlipidemia   ? Hypertension   ? Neuromuscular disorder ([place])   ? DJD  ? Refusal of blood transfusions as patient is Jehovah's Witness   ? ? ?Past Surgical History:  ?Procedure Laterality Date  ? APPENDECTOMY    ? BACK SURGERY    ? CARDIAC CATHETERIZATION  2007  ? Clean Cardiac Cath [facility] Cards), with RCA 30% narrowing likely due to catheter induced spasm in 09/02/10.  ? CARDIAC CATHETERIZATION  09/02/2010  ? mod. nonobstructive disease in the RCA and CX, tortuous LAD  ? COLONOSCOPY W/  POLYPECTOMY  03/17/11  ? diminutive polyp  ? LOOP RECORDER EXPLANT N/A 01/14/2014  ? Procedure: LOOP RECORDER EXPLANT;  Surgeon: Sanda Klein, MD;  Location: [place] CATH LAB;  Service: Cardiovascular;  Laterality: N/A;  ? LOOP RECORDER IMPLANT  04/06/2012  ? Reveal XT 4529  ? LOOP RECORDER IMPLANT N/A 04/06/2012  ? Procedure: LOOP RECORDER IMPLANT;  Surgeon: Sanda Klein, MD;  Location: [place] CATH LAB;  Service: Cardiovascular;  Laterality: N/A;  ? NM MYOCAR PERF WALL MOTION  01/25/2008  ? mild anteroapical wall ischemia  ? PATENT DUCTUS ARTERIOUS REPAIR    ? at age 55  ? ? ?Family History  ?Problem Relation Age of Onset  ? Colon cancer Paternal Grandfather   ? Prostate cancer Paternal Grandfather   ? Aneurysm Father   ? Heart attack Father 37  ? Coronary artery disease Father   ? Colon polyps Mother   ? Heart disease Mother   ? Coronary artery disease Mother   ? Migraines Mother   ? Breast cancer Maternal Grandmother   ? Colon cancer Paternal Uncle   ?     x 2  ? Stomach cancer Brother   ? Liver disease Other   ?     unsure who it was  ? Allergies Daughter   ? ? ?Social History  ? ?  Socioeconomic History  ? Marital status: Married  ?  Spouse name: Not on file  ? Number of children: Not on file  ? Years of education: Not on file  ? Highest education level: Not on file  ?Occupational History  ? Not on file  ?Tobacco Use  ? Smoking status: Never  ? Smokeless tobacco: Never  ?Vaping Use  ? Vaping Use: Never used  ?Substance and Sexual Activity  ? Alcohol use: No  ? Drug use: No  ? Sexual activity: Yes  ?Other Topics Concern  ? Not on file  ?Social History Narrative  ? ** Merged History Encounter **  ?    ? Holter monitor 08/2010: PVCs and sinus tachy.  ? Sleep Study (02/2008): mild sleep apnea, no indication for CPAP.  ? ?Social Determinants of Health  ? ?Financial Resource Strain: Medium Risk  ? Difficulty of Paying Living Expenses: Somewhat hard  ?Food Insecurity: Food Insecurity Present  ? Worried About Charity fundraiser in  the Last Year: Sometimes true  ? Ran Out of Food in the Last Year: Sometimes true  ?Transportation Needs: No Transportation Needs  ? Lack of Transportation (Medical): No  ? Lack of Transportation (Non-Medical): No  ?Physical Activity: Insufficiently Active  ? Days of Exercise per Week: 3 days  ? Minutes of Exercise per Session: 20 min  ?Stress: Stress Concern Present  ? Feeling of Stress : To some extent  ?Social Connections: Moderately Integrated  ? Frequency of Communication with Friends and Family: More than three times a week  ? Frequency of Social Gatherings with Friends and Family: More than three times a week  ? Attends Religious Services: More than 4 times per year  ? Active Member of Clubs or Organizations: No  ? Attends Archivist Meetings: Never  ? Marital Status: Married  ?Intimate Partner Violence: Not At Risk  ? Fear of Current or Ex-Partner: No  ? Emotionally Abused: No  ? Physically Abused: No  ? Sexually Abused: No  ? ? ?Outpatient Medications Prior to Visit  ?Medication Sig Dispense Refill  ? Ascorbic Acid (VITAMIN C) 1000 MG tablet Take 1,000 mg by mouth daily.    ? aspirin EC 81 MG EC tablet Take 1 tablet (81 mg total) by mouth daily. 30 tablet 0  ? atorvastatin (LIPITOR) 80 MG tablet TAKE 1 TABLET (80 MG TOTAL) BY MOUTH DAILY. 90 tablet 0  ? betamethasone dipropionate (DIPROLENE) 0.05 % cream Apply topically 2 (two) times daily. 30 g 0  ? cetirizine (ZYRTEC) 10 MG tablet Take 10 mg by mouth daily.    ? clonazePAM (KLONOPIN) 1 MG tablet TAKE 1/2 TABLET EVERY MORNING AND TAKE 1 TABLET EVERY EVENING 45 tablet 0  ? DULoxetine (CYMBALTA) 60 MG capsule Take 1 capsule (60 mg total) by mouth daily. 90 capsule 1  ? ezetimibe (ZETIA) 10 MG tablet TAKE 1 TABLET EVERY DAY 90 tablet 1  ? fluticasone (FLONASE) 50 MCG/ACT nasal spray USE 2 SPRAYS IN EACH NOSTRIL ONE TIME DAILY 48 g 0  ? fluticasone-salmeterol (WIXELA INHUB) 250-50 MCG/ACT AEPB Inhale 1 puff into the lungs in the morning and at  bedtime. 60 each 6  ? gabapentin (NEURONTIN) 300 MG capsule TAKE 1 CAPSULE AT 6AM, 11:30AM, 4:30PM. TAKE 3 CAPSULES AT 10PM 540 capsule 3  ? Icosapent Ethyl (VASCEPA) 1 g CAPS Take 2 capsules (2 g total) by mouth 2 (two) times daily. 120 capsule 0  ? ipratropium (ATROVENT) 0.03 % nasal spray Place 2  sprays into both nostrils every 12 (twelve) hours. 30 mL 12  ? ipratropium-albuterol (DUONEB) 0.5-2.5 (3) MG/3ML SOLN INHALE 1 VIAL VIA NEBULIZATION EVERY 6 HOURS AS NEEDED 360 mL 1  ? isosorbide mononitrate (IMDUR) 30 MG 24 hr tablet TAKE 1/2 TABLET BY MOUTH DAILY 45 tablet 1  ? ketoconazole (NIZORAL) 2 % shampoo Apply topically 2 (two) times a week. 360 mL 1  ? lisinopril (ZESTRIL) 10 MG tablet TAKE 1 TABLET (10 MG TOTAL) BY MOUTH DAILY. 90 tablet 0  ? meloxicam (MOBIC) 7.5 MG tablet TAKE 1 TABLET DAILY AS NEEDED FOR PAIN 90 tablet 1  ? metFORMIN (GLUCOPHAGE-XR) 500 MG 24 hr tablet Take 2 tablets (1,000 mg total) by mouth daily with breakfast. 180 tablet 3  ? metoprolol tartrate (LOPRESSOR) 50 MG tablet TAKE 1 TABLET (50 MG TOTAL) BY MOUTH 2 (TWO) TIMES DAILY. (Patient taking differently: Take 50 mg by mouth 2 (two) times daily. Take an additional 1/2 tablet on the days you have increased palpitations) 180 tablet 0  ? montelukast (SINGULAIR) 10 MG tablet TAKE 1 TABLET AT BEDTIME 90 tablet 1  ? Multiple Vitamin (MULTIVITAMIN WITH MINERALS) TABS tablet Take 1 tablet by mouth daily.    ? nitroGLYCERIN (NITROSTAT) 0.4 MG SL tablet Place 1 tablet (0.4 mg total) under the tongue every 5 (five) minutes as needed for chest pain. 25 tablet PRN  ? ONETOUCH VERIO test strip CHECK BLOOD SUGAR 3 TIMES DAILY 300 strip 3  ? pantoprazole (PROTONIX) 40 MG tablet Take 1 tablet (40 mg total) by mouth daily. 90 tablet 1  ? silver sulfADIAZINE (SILVADENE) 1 % cream Apply 1 application topically daily. 50 g 0  ? tamsulosin (FLOMAX) 0.4 MG CAPS capsule TAKE 1 CAPSULE (0.4 MG TOTAL) BY MOUTH DAILY. 90 capsule 0  ? tiZANidine (ZANAFLEX) 2 MG  tablet TAKE 1 TABLET EVERY 6 HOURS AS NEEDED FOR MUSCLE SPASM(S) 360 tablet 1  ? traMADol (ULTRAM) 50 MG tablet Take 1 tablet (50 mg total) by mouth 2 (two) times daily. 60 tablet 5  ? traZODone (DESYREL) 100 MG

## 2022-01-31 NOTE — Telephone Encounter (Signed)
Scheduled to see Dr. Etter Sjogren this  pm ?

## 2022-02-08 ENCOUNTER — Ambulatory Visit (INDEPENDENT_AMBULATORY_CARE_PROVIDER_SITE_OTHER): Payer: Medicare HMO | Admitting: Family

## 2022-02-08 VITALS — BP 116/58 | HR 60 | Temp 98.4°F | Resp 16 | Wt 197.0 lb

## 2022-02-08 DIAGNOSIS — R040 Epistaxis: Secondary | ICD-10-CM | POA: Diagnosis not present

## 2022-02-08 DIAGNOSIS — I493 Ventricular premature depolarization: Secondary | ICD-10-CM | POA: Diagnosis not present

## 2022-02-08 DIAGNOSIS — J4540 Moderate persistent asthma, uncomplicated: Secondary | ICD-10-CM

## 2022-02-08 NOTE — Assessment & Plan Note (Signed)
Known cyst left nare- + epistaxis. Refer to ENT.

## 2022-02-08 NOTE — Progress Notes (Signed)
Subjective:   By signing my name below, I, [name], attest that this documentation has been prepared under the direction and in the presence of [name] NP, 02/08/2022     Patient ID: Cody Raymond, male    DOB: [birth year], 51 y.o.   MRN: [id number]  Chief Complaint  Patient presents with   Cough    Here for follow up, getting better but still productive cough   Nasal Congestion    Still congested, nose bleeds at tims.    HPI Patient is in today for an office visit  Cough/Congestion/Sore Throat - He reports that his sore throat symptoms have been resolved but his cough and congestion is persistent. He also notes that he has found blood in his congestion. When he blows his nose, he notices chunks of blood coming from his right nostril. He is not sure if it's due to the cyst in his right nostril or not. He also states that he has been wheezing and has lung pain. He denies of any trouble breathing from his right nose. He has not taken Advair because of the cost.  Electrical Issue in Heart - He reports that he has an electrical issue in his heart. He has been regularly following up with Dr. Gwenlyn Found. States he has been forwarding him his home EKG data which has included PVC's and Sinus Rhythm with Supraventricular Ectopy (SVE).   Asthma - He has been using 10 Mg of Singulair. Can't afford [place] Maintenance Due  Topic Date Due   COVID-19 Vaccine (4 - Booster for Pfizer series) 10/13/2020   Zoster Vaccines- Shingrix (1 of 2) Never done    Past Medical History:  Diagnosis Date   Aneurysm ([place])    Asthma    Blood transfusion    CAD (coronary artery disease), non obstructive on cath 2011 04/05/2012   Coronary artery disease    Diabetes mellitus    Diabetes mellitus without complication ([place])    Enlarged prostate    GERD (gastroesophageal reflux disease)    H/O syncope    Heart disease    History of repair of patent ductus arteriosus 07/23/2016    Hyperlipidemia    Hypertension    Neuromuscular disorder ([place])    DJD   Refusal of blood transfusions as patient is Jehovah's Witness     Past Surgical History:  Procedure Laterality Date   APPENDECTOMY     BACK SURGERY     CARDIAC CATHETERIZATION  2007   Clean Cardiac Cath ([place]), with RCA 30% narrowing likely due to catheter induced spasm in 09/02/10.   CARDIAC CATHETERIZATION  09/02/2010   mod. nonobstructive disease in the RCA and CX, tortuous LAD   COLONOSCOPY W/ POLYPECTOMY  03/17/11   diminutive polyp   LOOP RECORDER EXPLANT N/A 01/14/2014   Procedure: LOOP RECORDER EXPLANT;  Surgeon: Sanda Klein, MD;  Location: [place] CATH LAB;  Service: Cardiovascular;  Laterality: N/A;   LOOP RECORDER IMPLANT  04/06/2012   Reveal XT 4529   LOOP RECORDER IMPLANT N/A 04/06/2012   Procedure: LOOP RECORDER IMPLANT;  Surgeon: Sanda Klein, MD;  Location: [place] CATH LAB;  Service: Cardiovascular;  Laterality: N/A;   NM MYOCAR PERF WALL MOTION  01/25/2008   mild anteroapical wall ischemia   PATENT DUCTUS ARTERIOUS REPAIR     at age 19    Family History  Problem Relation Age of Onset   Colon cancer Paternal Grandfather    Prostate cancer Paternal Grandfather  Aneurysm Father    Heart attack Father 68   Coronary artery disease Father    Colon polyps Mother    Heart disease Mother    Coronary artery disease Mother    Migraines Mother    Breast cancer Maternal Grandmother    Colon cancer Paternal Uncle        x 2   Stomach cancer Brother    Liver disease Other        unsure who it was   Allergies Daughter     Social History   Socioeconomic History   Marital status: Married    Spouse name: Not on file   Number of children: Not on file   Years of education: Not on file   Highest education level: Not on file  Occupational History   Not on file  Tobacco Use   Smoking status: Never   Smokeless tobacco: Never  Vaping Use   Vaping Use: Never used  Substance and Sexual  Activity   Alcohol use: No   Drug use: No   Sexual activity: Yes  Other Topics Concern   Not on file  Social History Narrative   ** Merged History Encounter **       Holter monitor 08/2010: PVCs and sinus tachy.   Sleep Study (02/2008): mild sleep apnea, no indication for CPAP.   Social Determinants of Health   Financial Resource Strain: Medium Risk   Difficulty of Paying Living Expenses: Somewhat hard  Food Insecurity: Food Insecurity Present   Worried About [place] in the Last Year: Sometimes true   Ran Out of Food in the Last Year: Sometimes true  Transportation Needs: No Transportation Needs   Lack of Transportation (Medical): No   Lack of Transportation (Non-Medical): No  Physical Activity: Insufficiently Active   Days of Exercise per Week: 3 days   Minutes of Exercise per Session: 20 min  Stress: Stress Concern Present   Feeling of Stress : To some extent  Social Connections: Moderately Integrated   Frequency of Communication with Friends and Family: More than three times a week   Frequency of Social Gatherings with Friends and Family: More than three times a week   Attends Religious Services: More than 4 times per year   Active Member of [employer] or Organizations: No   Attends Archivist Meetings: Never   Marital Status: Married  Human resources officer Violence: Not At Risk   Fear of Current or Ex-Partner: No   Emotionally Abused: No   Physically Abused: No   Sexually Abused: No    Outpatient Medications Prior to Visit  Medication Sig Dispense Refill   Ascorbic Acid (VITAMIN C) 1000 MG tablet Take 1,000 mg by mouth daily.     aspirin EC 81 MG EC tablet Take 1 tablet (81 mg total) by mouth daily. 30 tablet 0   atorvastatin (LIPITOR) 80 MG tablet TAKE 1 TABLET (80 MG TOTAL) BY MOUTH DAILY. 90 tablet 0   betamethasone dipropionate (DIPROLENE) 0.05 % cream Apply topically 2 (two) times daily. 30 g 0   cetirizine (ZYRTEC) 10 MG tablet Take 10 mg by mouth  daily.     clonazePAM (KLONOPIN) 1 MG tablet TAKE 1/2 TABLET EVERY MORNING AND TAKE 1 TABLET EVERY EVENING 45 tablet 0   DULoxetine (CYMBALTA) 60 MG capsule Take 1 capsule (60 mg total) by mouth daily. 90 capsule 1   ezetimibe (ZETIA) 10 MG tablet TAKE 1 TABLET EVERY DAY 90 tablet 1  fluticasone (FLONASE) 50 MCG/ACT nasal spray USE 2 SPRAYS IN EACH NOSTRIL ONE TIME DAILY 48 g 0   fluticasone-salmeterol (WIXELA INHUB) 250-50 MCG/ACT AEPB Inhale 1 puff into the lungs in the morning and at bedtime. 60 each 6   gabapentin (NEURONTIN) 300 MG capsule TAKE 1 CAPSULE AT 6AM, 11:30AM, 4:30PM. TAKE 3 CAPSULES AT 10PM 540 capsule 3   Icosapent Ethyl (VASCEPA) 1 g CAPS Take 2 capsules (2 g total) by mouth 2 (two) times daily. 120 capsule 0   ipratropium (ATROVENT) 0.03 % nasal spray Place 2 sprays into both nostrils every 12 (twelve) hours. 30 mL 12   ipratropium-albuterol (DUONEB) 0.5-2.5 (3) MG/3ML SOLN INHALE 1 VIAL VIA NEBULIZATION EVERY 6 HOURS AS NEEDED 360 mL 1   isosorbide mononitrate (IMDUR) 30 MG 24 hr tablet TAKE 1/2 TABLET BY MOUTH DAILY 45 tablet 1   ketoconazole (NIZORAL) 2 % shampoo Apply topically 2 (two) times a week. 360 mL 1   lisinopril (ZESTRIL) 10 MG tablet TAKE 1 TABLET (10 MG TOTAL) BY MOUTH DAILY. 90 tablet 0   meloxicam (MOBIC) 7.5 MG tablet TAKE 1 TABLET DAILY AS NEEDED FOR PAIN 90 tablet 1   metFORMIN (GLUCOPHAGE-XR) 500 MG 24 hr tablet Take 2 tablets (1,000 mg total) by mouth daily with breakfast. 180 tablet 3   metoprolol tartrate (LOPRESSOR) 50 MG tablet TAKE 1 TABLET (50 MG TOTAL) BY MOUTH 2 (TWO) TIMES DAILY. (Patient taking differently: Take 50 mg by mouth 2 (two) times daily. Take an additional 1/2 tablet on the days you have increased palpitations) 180 tablet 0   montelukast (SINGULAIR) 10 MG tablet TAKE 1 TABLET AT BEDTIME 90 tablet 1   Multiple Vitamin (MULTIVITAMIN WITH MINERALS) TABS tablet Take 1 tablet by mouth daily.     nitroGLYCERIN (NITROSTAT) 0.4 MG SL tablet  Place 1 tablet (0.4 mg total) under the tongue every 5 (five) minutes as needed for chest pain. 25 tablet PRN   ONETOUCH VERIO test strip CHECK BLOOD SUGAR 3 TIMES DAILY 300 strip 3   pantoprazole (PROTONIX) 40 MG tablet Take 1 tablet (40 mg total) by mouth daily. 90 tablet 1   promethazine-dextromethorphan (PROMETHAZINE-DM) 6.25-15 MG/5ML syrup Take 5 mLs by mouth 4 (four) times daily as needed. 118 mL 0   silver sulfADIAZINE (SILVADENE) 1 % cream Apply 1 application topically daily. 50 g 0   tamsulosin (FLOMAX) 0.4 MG CAPS capsule TAKE 1 CAPSULE (0.4 MG TOTAL) BY MOUTH DAILY. 90 capsule 0   tiZANidine (ZANAFLEX) 2 MG tablet TAKE 1 TABLET EVERY 6 HOURS AS NEEDED FOR MUSCLE SPASM(S) 360 tablet 1   traMADol (ULTRAM) 50 MG tablet Take 1 tablet (50 mg total) by mouth 2 (two) times daily. 60 tablet 5   traZODone (DESYREL) 100 MG tablet TAKE 1 TABLET AT BEDTIME 90 tablet 3   zolpidem (AMBIEN) 10 MG tablet TAKE 1 TABLET AT BEDTIME AS NEEDED FOR SLEEP 30 tablet 0   Zoster Vaccine Adjuvanted [facility]) injection Inject 0.'5mg'$  IM now and repeat in 2-6 months. 0.5 mL 1   azithromycin (ZITHROMAX Z-PAK) 250 MG tablet Take 2 tablets by mouth today then take 1 tablet by mouth for 4 days 6 each 0   predniSONE (DELTASONE) 20 MG tablet Take 2 tablets (40 mg total) by mouth daily. 10 tablet 0   No facility-administered medications prior to visit.    Allergies  Allergen Reactions   Benadryl [Diphenhydramine Hcl]     "drives me nuts"    Review of Systems  HENT:  Positive for congestion (W/ Blood). Negative for sore throat.   Respiratory:  Positive for cough and wheezing.        (+) Lung Pain (-) Trouble Breathing Through Nostrils      Objective:    Physical Exam Constitutional:      General: He is not in acute distress.    Appearance: Normal appearance. He is not ill-appearing.  HENT:     Head: Normocephalic and atraumatic.     Right Ear: External ear normal.     Left Ear: External ear normal.   Eyes:     Extraocular Movements: Extraocular movements intact.     Pupils: Pupils are equal, round, and reactive to light.  Cardiovascular:     Rate and Rhythm: Normal rate. Rhythm irregular.     Heart sounds: Normal heart sounds. No murmur heard.   No gallop.  Pulmonary:     Effort: Pulmonary effort is normal. No respiratory distress.     Breath sounds: Wheezing (Expiratory (Bilateral)) present. No rales.  Skin:    General: Skin is warm and dry.     Comments: Cyst in left nose  Neurological:     Mental Status: He is alert and oriented to person, place, and time.  Psychiatric:        Mood and Affect: Mood normal.        Behavior: Behavior normal.        Judgment: Judgment normal.    BP (!) 116/58 (BP Location: Right Arm, Patient Position: Sitting, Cuff Size: Large)   Pulse 60   Temp 98.4 F (36.9 C) (Oral)   Resp 16   Wt 197 lb (89.4 kg)   SpO2 99%   BMI 29.95 kg/m  Wt Readings from Last 3 Encounters:  02/08/22 197 lb (89.4 kg)  01/31/22 197 lb 6.4 oz (89.5 kg)  12/29/21 199 lb (90.3 kg)       Assessment & Plan:   Problem List Items Addressed This Visit       Unprioritized   Epistaxis    Known cyst left nare- + epistaxis. Refer to ENT.        Relevant Orders   Ambulatory referral to ENT   Asthma - Primary    Uncontrolled. He would definitely benefit from advair but unfortunately it is too expensive, even with good rx.  He is using a flovent that he had on hand but cannot afford another.  Will place a referral to chronic care management to see if he may qualify for patient assistance for Advair or symbicort. In the meantime, continue singulair, albuterol.        Relevant Orders   AMB Referral to [facility] Coordinaton   No orders of the defined types were placed in this encounter.  >30 minutes spent on today's visit. Time was spent counseling pt on asthma, and epistaxis as well as reviewing medical record.   I, Nance Pear, NP, personally  preformed the services described in this documentation.  All medical record entries made by the scribe were at my direction and in my presence.  I have reviewed the chart and discharge instructions (if applicable) and agree that the record reflects my personal performance and is accurate and complete. 02/08/2022   I,Amber Collins,acting as a scribe for Nance Pear, NP.,have documented all relevant documentation on the behalf of Nance Pear, NP,as directed by  Nance Pear, NP while in the presence of Nance Pear, NP.    Alayssa Flinchum S  Inda Castle, NP

## 2022-02-08 NOTE — Assessment & Plan Note (Signed)
He is asymptomatic at time of visit. He is advised to continue his communication/follow up with his cardiologist.

## 2022-02-08 NOTE — Assessment & Plan Note (Signed)
Uncontrolled. He would definitely benefit from advair but unfortunately it is too expensive, even with good rx.  He is using a flovent that he had on hand but cannot afford another.  Will place a referral to chronic care management to see if he may qualify for patient assistance for Advair or symbicort. In the meantime, continue singulair, albuterol.

## 2022-02-09 ENCOUNTER — Telehealth: Payer: Self-pay | Admitting: *Deleted

## 2022-02-09 NOTE — Chronic Care Management (AMB) (Signed)
  Chronic Care Management   Outreach Note  02/09/2022 Name: Cody Raymond MRN: [id number] DOB: 1970/12/01  Cody Raymond is a 51 y.o. year old male who is a primary care patient of [name], NP. I reached out to [employer] by phone today in response to a referral sent by Cody Raymond primary care provider.  An unsuccessful telephone outreach was attempted today. The patient was referred to the case management team for assistance with care management and care coordination.   Follow Up Plan: A HIPAA compliant phone message was left for the patient providing contact information and requesting a return call.   Julian Hy, [place] Management  Direct Dial: [phone]

## 2022-02-15 NOTE — Chronic Care Management (AMB) (Signed)
  Chronic Care Management   Outreach Note  02/15/2022 Name: Cody Raymond MRN: [id number] DOB: 20-Apr-1971  Cody Raymond is a 51 y.o. year old male who is a primary care patient of [name], NP. I reached out to [employer] by phone today in response to a referral sent by Mr. Clare Casto primary care provider.  A second unsuccessful telephone outreach was attempted today. The patient was referred to the case management team for assistance with care management and care coordination.   Follow Up Plan: A HIPAA compliant phone message was left for the patient providing contact information and requesting a return call.   Julian Hy, [place] Management  Direct Dial: [phone]

## 2022-02-17 ENCOUNTER — Encounter (INDEPENDENT_AMBULATORY_CARE_PROVIDER_SITE_OTHER): Payer: Medicare Other | Admitting: Ophthalmology

## 2022-02-22 NOTE — Chronic Care Management (AMB) (Signed)
  Chronic Care Management   Outreach Note  02/22/2022 Name: Cody Raymond MRN: [id number] DOB: 17-Jun-1971  Cody Raymond is a 51 y.o. year old male who is a primary care patient of [name], NP. I reached out to [employer] by phone today in response to a referral sent by [name] primary care provider.  Third unsuccessful telephone outreach was attempted today. The patient was referred to the case management team for assistance with care management and care coordination. The patient's primary care provider has been notified of our unsuccessful attempts to make or maintain contact with the patient. The care management team is pleased to engage with this patient at any time in the future should he/she be interested in assistance from the care management team.   Follow Up Plan: We have been unable to make contact with the patient for follow up. The care management team is available to follow up with the patient after provider conversation with the patient regarding recommendation for care management engagement and subsequent re-referral to the care management team.   Julian Hy, [place] Management  Direct Dial: [phone]

## 2022-02-23 ENCOUNTER — Other Ambulatory Visit: Payer: Self-pay | Admitting: Family

## 2022-02-24 ENCOUNTER — Encounter (INDEPENDENT_AMBULATORY_CARE_PROVIDER_SITE_OTHER): Payer: Medicare HMO | Admitting: Ophthalmology

## 2022-02-27 DIAGNOSIS — J019 Acute sinusitis, unspecified: Secondary | ICD-10-CM | POA: Diagnosis not present

## 2022-03-06 IMAGING — CR DG CHEST 2V
2 series · 2 of 2 positions shown · non-contrast
Comparison: 11/30/2019

CLINICAL DATA: Chest pain.

EXAM:
CHEST - 2 VIEW

[chest pa]
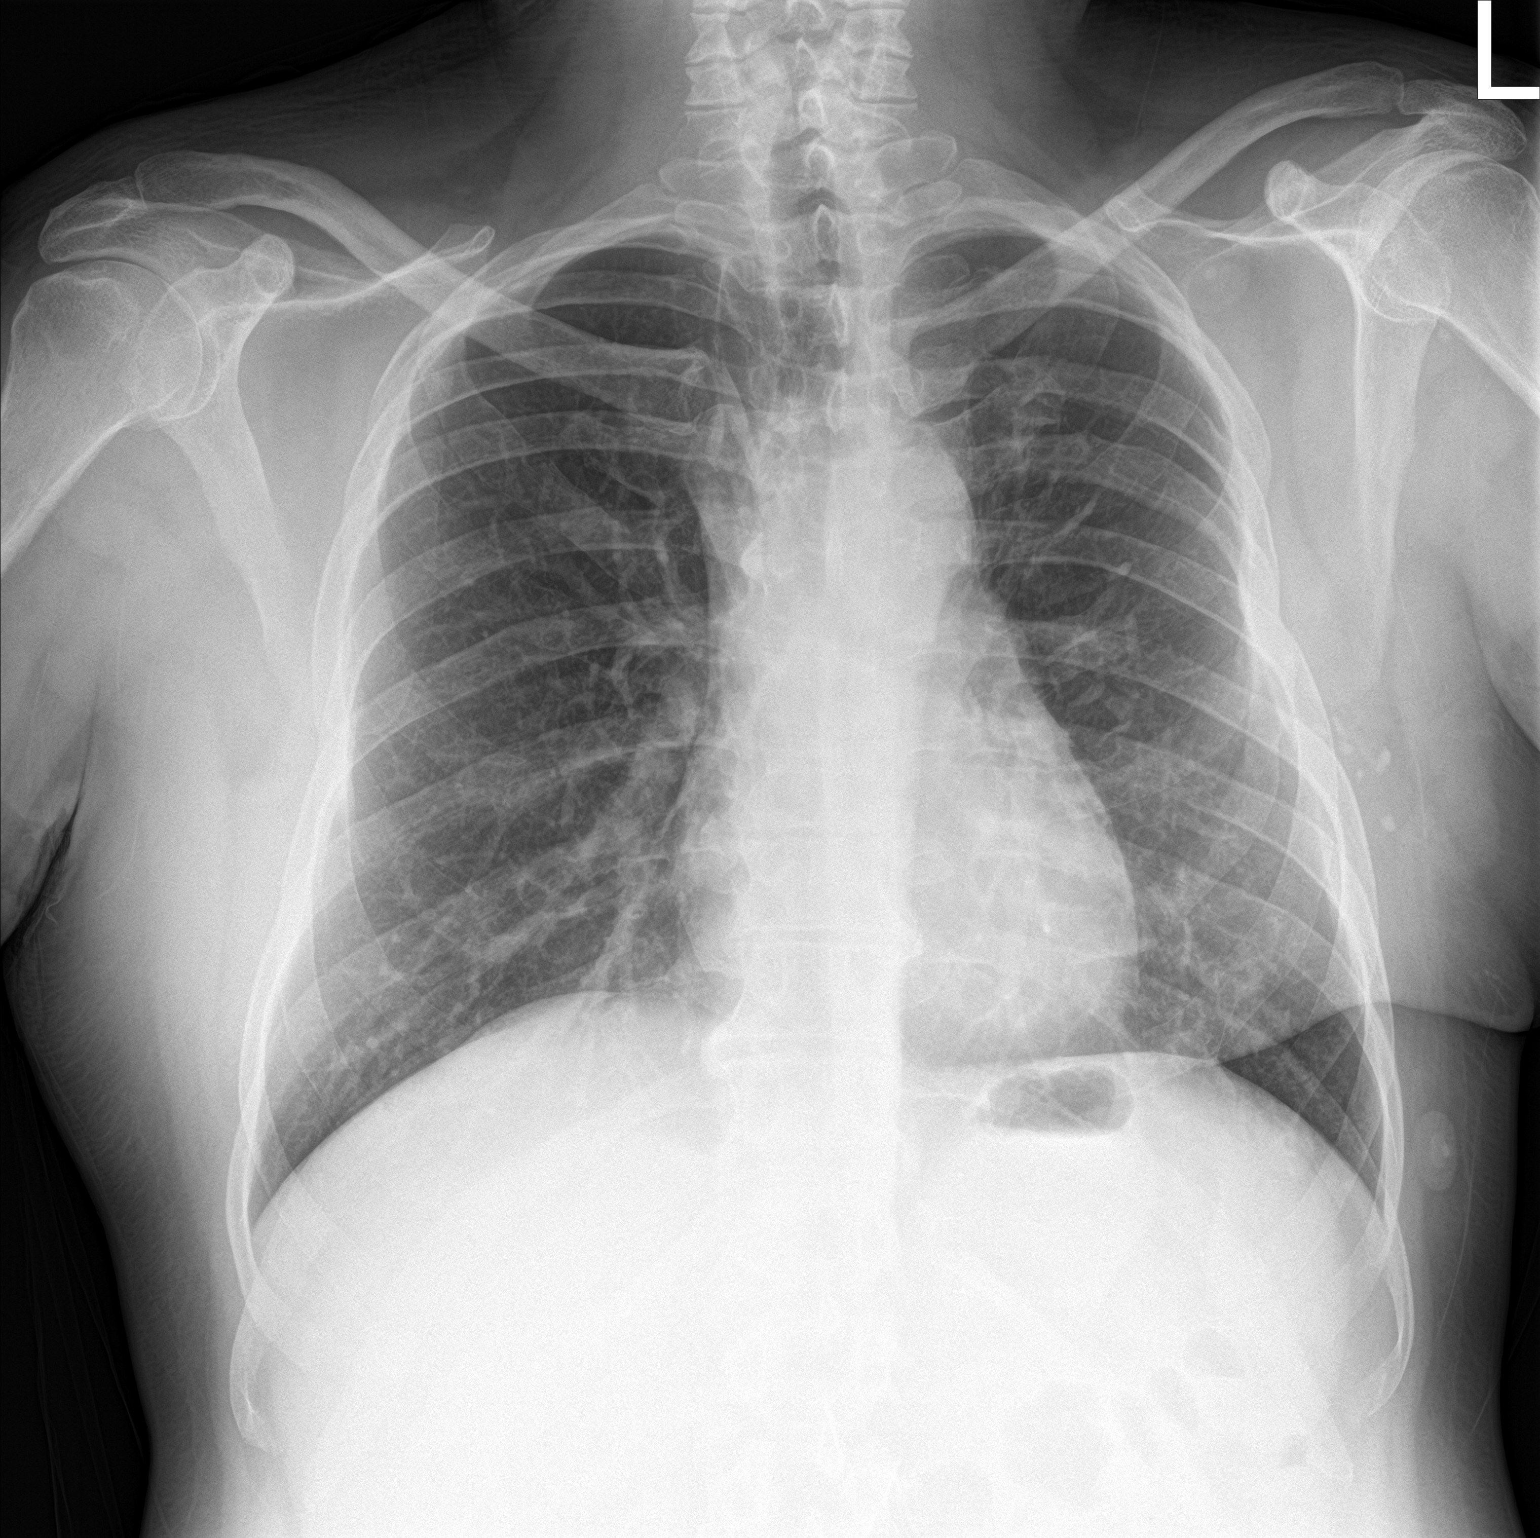

[chest lat]
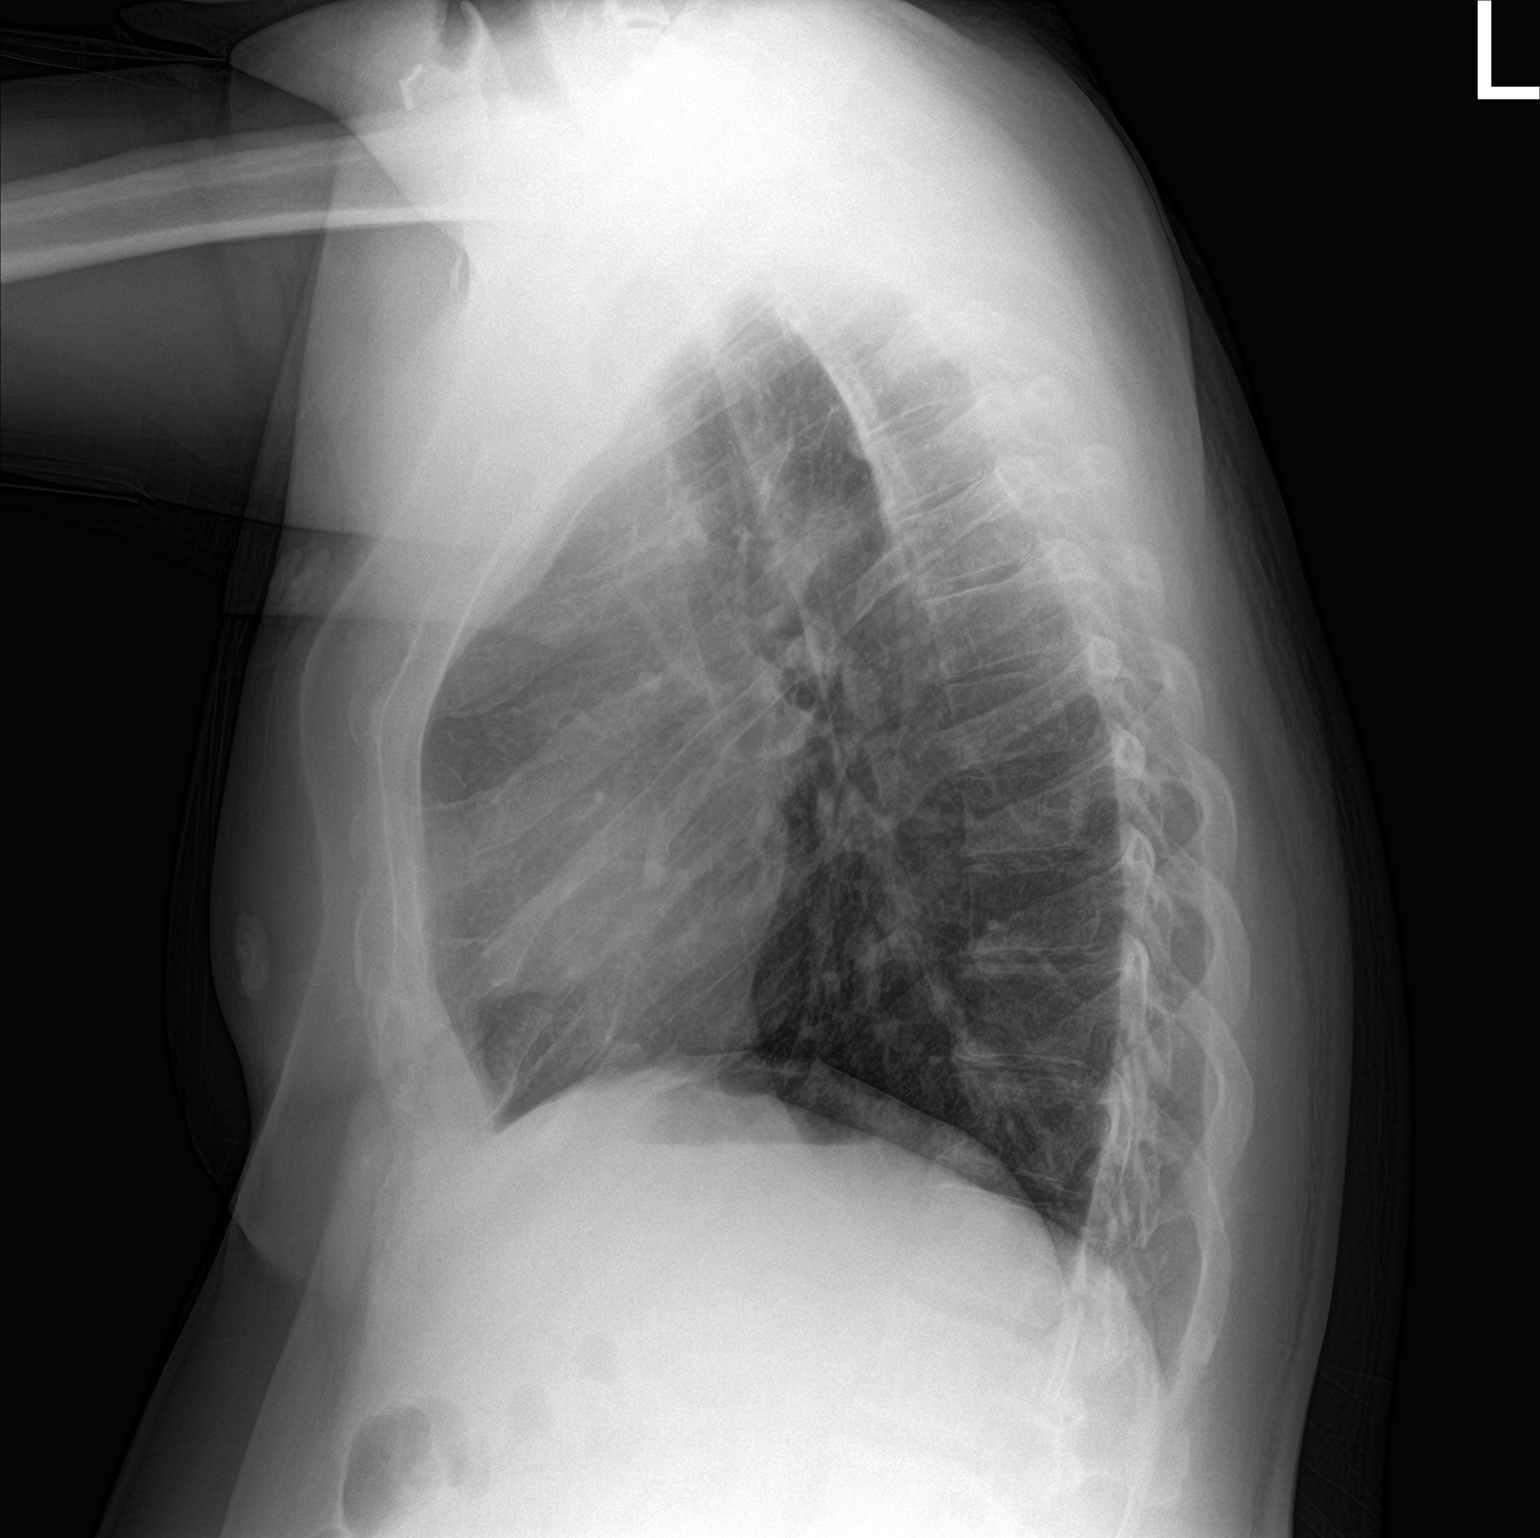

[2 of 2 positions shown; findings below may reference images not displayed]

FINDINGS: Cardiomediastinal contours and hilar structures are normal. Lungs
are clear. No pleural effusion.

Signs of prior trauma to the left hemithorax, unchanged from
previous exam. Multiple healed rib fractures as before.
IMPRESSION: No acute cardiopulmonary disease.

## 2022-03-10 ENCOUNTER — Encounter (INDEPENDENT_AMBULATORY_CARE_PROVIDER_SITE_OTHER): Payer: Medicare HMO | Admitting: Ophthalmology

## 2022-03-24 ENCOUNTER — Other Ambulatory Visit: Payer: Self-pay | Admitting: Family

## 2022-03-24 DIAGNOSIS — E785 Hyperlipidemia, unspecified: Secondary | ICD-10-CM

## 2022-04-07 ENCOUNTER — Encounter (INDEPENDENT_AMBULATORY_CARE_PROVIDER_SITE_OTHER): Payer: Medicare HMO | Admitting: Ophthalmology

## 2022-04-07 ENCOUNTER — Ambulatory Visit (INDEPENDENT_AMBULATORY_CARE_PROVIDER_SITE_OTHER): Payer: Medicare HMO | Admitting: Ophthalmology

## 2022-04-07 ENCOUNTER — Encounter (INDEPENDENT_AMBULATORY_CARE_PROVIDER_SITE_OTHER): Payer: Self-pay | Admitting: Ophthalmology

## 2022-04-07 DIAGNOSIS — E1142 Type 2 diabetes mellitus with diabetic polyneuropathy: Secondary | ICD-10-CM | POA: Diagnosis not present

## 2022-04-07 DIAGNOSIS — H3552 Pigmentary retinal dystrophy: Secondary | ICD-10-CM

## 2022-04-07 DIAGNOSIS — H35351 Cystoid macular degeneration, right eye: Secondary | ICD-10-CM

## 2022-04-07 DIAGNOSIS — H35353 Cystoid macular degeneration, bilateral: Secondary | ICD-10-CM

## 2022-04-07 DIAGNOSIS — H35352 Cystoid macular degeneration, left eye: Secondary | ICD-10-CM

## 2022-04-07 NOTE — Assessment & Plan Note (Signed)
Diffuse retinal atrophy with bone spicule pigmentation confirmatory diagnosis

## 2022-04-07 NOTE — Assessment & Plan Note (Signed)
Minor at this time

## 2022-04-07 NOTE — Assessment & Plan Note (Signed)
Transient blurred vision at times likely associated with changes in blood sugar control.  No diabetic retinopathy.  Patient has no concerns of diabetic retinopathy every triggering vision loss in either eye

## 2022-04-07 NOTE — Progress Notes (Signed)
04/07/2022     CHIEF COMPLAINT Patient presents for  Chief Complaint  Patient presents with   Cystoid Macular Edema      HISTORY OF PRESENT ILLNESS: Cody Raymond is a 51 y.o. male who presents to the clinic today for:   HPI   1 YR FU OU OCT FP. Pt stated "Every now and then I get blurred vision and I have to rub my eyes in." Pt stated vision has been stable but it gets blurry every now and then.   Last edited by Silvestre Moment on 04/07/2022  8:45 AM.      Referring physician: [name], NP [address] [place],  [place] [address]  HISTORICAL INFORMATION:   Selected notes from the MEDICAL RECORD NUMBER    Lab Results  Component Value Date   HGBA1C 6.8 (H) 12/28/2021     CURRENT MEDICATIONS: No current outpatient medications on file. (Ophthalmic Drugs)   No current facility-administered medications for this visit. (Ophthalmic Drugs)   Current Outpatient Medications (Other)  Medication Sig   Ascorbic Acid (VITAMIN C) 1000 MG tablet Take 1,000 mg by mouth daily.   aspirin EC 81 MG EC tablet Take 1 tablet (81 mg total) by mouth daily.   atorvastatin (LIPITOR) 80 MG tablet TAKE 1 TABLET (80 MG TOTAL) BY MOUTH DAILY.   betamethasone dipropionate (DIPROLENE) 0.05 % cream Apply topically 2 (two) times daily.   cetirizine (ZYRTEC) 10 MG tablet Take 10 mg by mouth daily.   clonazePAM (KLONOPIN) 1 MG tablet TAKE 1/2 TABLET EVERY MORNING AND TAKE 1 TABLET EVERY EVENING   DULoxetine (CYMBALTA) 60 MG capsule Take 1 capsule (60 mg total) by mouth daily.   ezetimibe (ZETIA) 10 MG tablet TAKE 1 TABLET EVERY DAY   fluticasone (FLONASE) 50 MCG/ACT nasal spray USE 2 SPRAYS IN EACH NOSTRIL ONE TIME DAILY   fluticasone-salmeterol (WIXELA INHUB) 250-50 MCG/ACT AEPB Inhale 1 puff into the lungs in the morning and at bedtime.   gabapentin (NEURONTIN) 300 MG capsule TAKE 1 CAPSULE AT 6AM, 11:30AM, 4:30PM. TAKE 3 CAPSULES AT 10PM   Icosapent Ethyl (VASCEPA) 1 g CAPS  Take 2 capsules (2 g total) by mouth 2 (two) times daily.   ipratropium (ATROVENT) 0.03 % nasal spray Place 2 sprays into both nostrils every 12 (twelve) hours.   ipratropium-albuterol (DUONEB) 0.5-2.5 (3) MG/3ML SOLN INHALE 1 VIAL VIA NEBULIZATION EVERY 6 HOURS AS NEEDED   isosorbide mononitrate (IMDUR) 30 MG 24 hr tablet TAKE 1/2 TABLET BY MOUTH DAILY   ketoconazole (NIZORAL) 2 % shampoo Apply topically 2 (two) times a week.   lisinopril (ZESTRIL) 10 MG tablet TAKE 1 TABLET (10 MG TOTAL) BY MOUTH DAILY.   meloxicam (MOBIC) 7.5 MG tablet TAKE 1 TABLET DAILY AS NEEDED FOR PAIN   metFORMIN (GLUCOPHAGE-XR) 500 MG 24 hr tablet Take 2 tablets (1,000 mg total) by mouth daily with breakfast.   metoprolol tartrate (LOPRESSOR) 50 MG tablet TAKE 1 TABLET (50 MG TOTAL) BY MOUTH 2 (TWO) TIMES DAILY. (Patient taking differently: Take 50 mg by mouth 2 (two) times daily. Take an additional 1/2 tablet on the days you have increased palpitations)   montelukast (SINGULAIR) 10 MG tablet TAKE 1 TABLET AT BEDTIME   Multiple Vitamin (MULTIVITAMIN WITH MINERALS) TABS tablet Take 1 tablet by mouth daily.   nitroGLYCERIN (NITROSTAT) 0.4 MG SL tablet Place 1 tablet (0.4 mg total) under the tongue every 5 (five) minutes as needed for chest pain.   ONETOUCH VERIO  test strip CHECK BLOOD SUGAR 3 TIMES DAILY   pantoprazole (PROTONIX) 40 MG tablet TAKE 1 TABLET (40 MG TOTAL) BY MOUTH DAILY.   promethazine-dextromethorphan (PROMETHAZINE-DM) 6.25-15 MG/5ML syrup Take 5 mLs by mouth 4 (four) times daily as needed.   silver sulfADIAZINE (SILVADENE) 1 % cream Apply 1 application topically daily.   tamsulosin (FLOMAX) 0.4 MG CAPS capsule TAKE 1 CAPSULE (0.4 MG TOTAL) BY MOUTH DAILY.   tiZANidine (ZANAFLEX) 2 MG tablet TAKE 1 TABLET EVERY 6 HOURS AS NEEDED FOR MUSCLE SPASM(S)   traMADol (ULTRAM) 50 MG tablet Take 1 tablet (50 mg total) by mouth 2 (two) times daily.   traZODone (DESYREL) 100 MG tablet TAKE 1 TABLET AT BEDTIME    zolpidem (AMBIEN) 10 MG tablet TAKE 1 TABLET AT BEDTIME AS NEEDED FOR SLEEP   Zoster Vaccine Adjuvanted [facility]) injection Inject 0.'5mg'$  IM now and repeat in 2-6 months.   No current facility-administered medications for this visit. (Other)      REVIEW OF SYSTEMS: ROS   Negative for: Constitutional, Gastrointestinal, Neurological, Skin, Genitourinary, Musculoskeletal, HENT, Endocrine, Cardiovascular, Eyes, Respiratory, Psychiatric, Allergic/Imm, Heme/Lymph Last edited by Silvestre Moment on 04/07/2022  8:45 AM.       ALLERGIES Allergies  Allergen Reactions   Benadryl [Diphenhydramine Hcl]     "drives me nuts"    PAST MEDICAL HISTORY Past Medical History:  Diagnosis Date   Aneurysm ([place])    Asthma    Blood transfusion    CAD (coronary artery disease), non obstructive on cath 2011 04/05/2012   Coronary artery disease    Diabetes mellitus    Diabetes mellitus without complication ([place])    Enlarged prostate    GERD (gastroesophageal reflux disease)    H/O syncope    Heart disease    History of repair of patent ductus arteriosus 07/23/2016   Hyperlipidemia    Hypertension    Neuromuscular disorder ([place])    DJD   Refusal of blood transfusions as patient is Jehovah's Witness    Past Surgical History:  Procedure Laterality Date   APPENDECTOMY     BACK SURGERY     CARDIAC CATHETERIZATION  2007   Clean Cardiac Cath ([place]), with RCA 30% narrowing likely due to catheter induced spasm in 09/02/10.   CARDIAC CATHETERIZATION  09/02/2010   mod. nonobstructive disease in the RCA and CX, tortuous LAD   COLONOSCOPY W/ POLYPECTOMY  03/17/11   diminutive polyp   LOOP RECORDER EXPLANT N/A 01/14/2014   Procedure: LOOP RECORDER EXPLANT;  Surgeon: Sanda Klein, MD;  Location: [facility] CATH LAB;  Service: Cardiovascular;  Laterality: N/A;   LOOP RECORDER IMPLANT  04/06/2012   Reveal XT 4529   LOOP RECORDER IMPLANT N/A 04/06/2012   Procedure: LOOP RECORDER IMPLANT;  Surgeon: Sanda Klein, MD;  Location: [facility] CATH LAB;  Service: Cardiovascular;  Laterality: N/A;   NM MYOCAR PERF WALL MOTION  01/25/2008   mild anteroapical wall ischemia   PATENT DUCTUS ARTERIOUS REPAIR     at age 62    FAMILY HISTORY Family History  Problem Relation Age of Onset   Colon cancer Paternal Grandfather    Prostate cancer Paternal Grandfather    Aneurysm Father    Heart attack Father 50   Coronary artery disease Father    Colon polyps Mother    Heart disease Mother    Coronary artery disease Mother    Migraines Mother    Breast cancer Maternal Grandmother    Colon cancer Paternal Uncle  x 2   Stomach cancer Brother    Liver disease Other        unsure who it was   Allergies Daughter     SOCIAL HISTORY Social History   Tobacco Use   Smoking status: Never   Smokeless tobacco: Never  Vaping Use   Vaping Use: Never used  Substance Use Topics   Alcohol use: No   Drug use: No         OPHTHALMIC EXAM:  Base Eye Exam     Visual Acuity (ETDRS)       Right Left   Dist [place] 20/40 -1 20/40   Dist ph [place] NI 20/30 -2         Tonometry (Tonopen, 8:50 AM)       Right Left   Pressure 13 17         Pupils       Pupils APD   Right PERRL None   Left PERRL None         Visual Fields       Left Right    Full Full         Extraocular Movement       Right Left    Full Full         Neuro/Psych     Oriented x3: Yes   Mood/Affect: Normal         Dilation     Both eyes: 1.0% Mydriacyl, 2.5% Phenylephrine @ 8:50 AM           Slit Lamp and Fundus Exam     External Exam       Right Left   External Normal Normal         Slit Lamp Exam       Right Left   Lids/Lashes Normal Normal   Conjunctiva/Sclera White and quiet White and quiet   Cornea Clear Clear   Anterior Chamber Deep and quiet Deep and quiet   Iris Round and reactive Round and reactive   Lens 1+ Nuclear sclerosis 1+ Nuclear sclerosis   Anterior Vitreous Normal Normal          Fundus Exam       Right Left   Posterior Vitreous Normal Normal   Disc Myelinated nerve fiber one third of the nerve rim Myelinated nerve fiber nearly encircles the rim of the nerve   C/D Ratio 0.1 0.1   Macula Normal, no clinical CME Normal, no clinical CME   Vessels Normal Normal   Periphery Bone spicule type pigmentation sectorial, inferior between 5 and 7:00 peripherally EARLY ,  bone spicule pigmentation noted            IMAGING AND PROCEDURES  Imaging and Procedures for 04/07/22  OCT, Retina - OU - Both Eyes       Right Eye Quality was good. Scan locations included subfoveal. Central Foveal Thickness: 272. Progression has been stable. Findings include abnormal foveal contour.   Left Eye Quality was good. Scan locations included subfoveal. Central Foveal Thickness: 255. Progression has been stable. Findings include abnormal foveal contour.   Notes Intraretinal's mini cystoid change, nasal to the fovea OD, no overt CME.  This does extend into the inferior aspect of the macula however ringing the foveal region  OS with small amounts of intraretinal CME particularly nasal extending along the superior half of the macular region as well.  Some also extends inferior portion of the macula.    Extensive posterior pole  retinal atrophy diffuse.  Macular thickness relatively preserved year-over-year      Color Fundus Photography Optos - OU - Both Eyes       Right Eye Progression has no prior data. Macula : normal observations. Vessels : normal observations.   Left Eye Progression has no prior data.   Notes Peripheral Bone spicule pigmentation posterior pole and inferiorly.  OD with medullary nerve fibers on the superior pole of the optic nerve.  This is not pathologic.    OS with medullary nerve fibers nearly 360 around the nerve not pathologic.  Early perivascular bone spicule formation superonasal nerve.                 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  No diabetic retinopathy.  Patient has no concerns of diabetic retinopathy every triggering vision loss in either eye     ICD-10-CM   1. Cystoid macular edema of left eye  H35.352 OCT, Retina - OU - Both Eyes    Color Fundus Photography Optos - OU - Both Eyes    2. Cystoid macular edema of right eye  H35.351     3. Retinitis pigmentosa, both eyes  H35.52     4. Type 2 diabetes, controlled, with peripheral neuropathy (HCC)  E11.42       1.  Bilateral minimal CME at this time stable acuity.  Diffuse retinal atrophy peripherally consistent with RPE  2.  No signs of progression of cataract at this time.  I explained the patient that cataract formation is typically associated but he has no requirement for intervention of that condition at this time  3.  Ophthalmic Meds Ordered this visit:  No orders of the defined types were placed in this encounter.      Return in about 1 year (around 04/08/2023) for DILATE OU, COLOR FP, OCT.  There are no Patient Instructions on file for this visit.   Explained the diagnoses, plan, and follow up with the patient and they expressed understanding.  Patient expressed understanding of the importance of proper follow up care.   Clent Demark Taira Knabe M.D. Diseases & Surgery of the Retina and Vitreous Retina [facility] Diabetic [place] 04/07/22     Abbreviations: M myopia (nearsighted); A astigmatism; H hyperopia (farsighted); P presbyopia; Mrx spectacle prescription;  CTL contact lenses; OD right eye; OS left eye; OU both eyes  XT exotropia; ET esotropia; PEK  punctate epithelial keratitis; PEE punctate epithelial erosions; DES dry eye syndrome; MGD meibomian gland dysfunction; ATs artificial tears; PFAT's preservative free artificial tears; [place] nuclear sclerotic cataract; PSC posterior subcapsular cataract; ERM epi-retinal membrane; PVD posterior vitreous detachment; RD retinal detachment; DM diabetes mellitus; DR diabetic retinopathy; NPDR non-proliferative diabetic retinopathy; PDR proliferative diabetic retinopathy; CSME clinically significant macular edema; DME diabetic macular edema; dbh dot blot hemorrhages; CWS cotton wool spot; POAG primary open angle glaucoma; C/D cup-to-disc ratio; HVF humphrey visual field; GVF goldmann visual field; OCT optical coherence tomography; IOP intraocular pressure; BRVO Branch retinal vein occlusion; CRVO central retinal vein occlusion; CRAO central retinal artery occlusion; BRAO branch retinal artery occlusion; RT retinal tear; SB scleral buckle; PPV pars plana vitrectomy; VH Vitreous hemorrhage; PRP panretinal laser photocoagulation;  IVK intravitreal kenalog; VMT vitreomacular traction; MH Macular hole;  NVD neovascularization of the disc; NVE neovascularization elsewhere; AREDS age related eye disease study; ARMD age related macular degeneration; POAG primary open angle glaucoma; EBMD epithelial/anterior basement membrane dystrophy; ACIOL anterior chamber intraocular lens; IOL intraocular lens; PCIOL posterior chamber intraocular lens; Phaco/IOL phacoemulsification with intraocular lens placement; [place] photorefractive keratectomy; LASIK laser assisted in situ keratomileusis; HTN hypertension; DM diabetes mellitus; COPD chronic obstructive pulmonary disease

## 2022-04-08 ENCOUNTER — Other Ambulatory Visit: Payer: Self-pay | Admitting: Family

## 2022-04-08 DIAGNOSIS — E785 Hyperlipidemia, unspecified: Secondary | ICD-10-CM

## 2022-04-11 MED ORDER — ZOLPIDEM TARTRATE 10 MG PO TABS: 10.0000 mg | ORAL_TABLET | Freq: Every evening | ORAL | 0 refills | Status: DC | PRN

## 2022-04-11 MED ORDER — CLONAZEPAM 1 MG PO TABS: ORAL_TABLET | ORAL | 0 refills | Status: DC

## 2022-04-11 MED ORDER — LISINOPRIL 10 MG PO TABS: 10.0000 mg | ORAL_TABLET | Freq: Every day | ORAL | 0 refills | Status: DC

## 2022-04-11 MED ORDER — METOPROLOL TARTRATE 50 MG PO TABS: 50.0000 mg | ORAL_TABLET | Freq: Two times a day (BID) | ORAL | 0 refills | Status: DC

## 2022-04-11 MED ORDER — ATORVASTATIN CALCIUM 80 MG PO TABS: 80.0000 mg | ORAL_TABLET | Freq: Every day | ORAL | 0 refills | Status: DC

## 2022-04-11 MED ORDER — TAMSULOSIN HCL 0.4 MG PO CAPS: 0.4000 mg | ORAL_CAPSULE | Freq: Every day | ORAL | 0 refills | Status: DC

## 2022-04-11 NOTE — Telephone Encounter (Signed)
Requesting: Ambien '10mg'$  and clonazepam '1mg'$   Contract: 08/21/2019 UDS: 12/30/21 Last Visit: 02/08/22 Next Visit: 05/11/22 Last Refill on Ambien: 12/18/21 #30 and 0RF Last Refill on clonazepam: 12/18/21 #45 and 0RF  Please Advise

## 2022-04-12 ENCOUNTER — Other Ambulatory Visit: Payer: Self-pay | Admitting: Family

## 2022-04-12 MED ORDER — CLONAZEPAM 1 MG PO TABS: ORAL_TABLET | ORAL | 0 refills | Status: DC

## 2022-04-13 ENCOUNTER — Other Ambulatory Visit: Payer: Self-pay | Admitting: Family

## 2022-04-24 ENCOUNTER — Other Ambulatory Visit: Payer: Self-pay | Admitting: Physical Medicine & Rehabilitation

## 2022-04-24 ENCOUNTER — Other Ambulatory Visit: Payer: Self-pay | Admitting: Family

## 2022-04-28 ENCOUNTER — Other Ambulatory Visit: Payer: Self-pay | Admitting: Physical Medicine & Rehabilitation

## 2022-05-08 ENCOUNTER — Other Ambulatory Visit: Payer: Self-pay | Admitting: Family

## 2022-05-09 ENCOUNTER — Other Ambulatory Visit: Payer: Self-pay | Admitting: Family

## 2022-05-09 ENCOUNTER — Other Ambulatory Visit: Payer: Self-pay | Admitting: Physical Medicine & Rehabilitation

## 2022-05-09 DIAGNOSIS — M545 Low back pain, unspecified: Secondary | ICD-10-CM

## 2022-05-11 ENCOUNTER — Ambulatory Visit (INDEPENDENT_AMBULATORY_CARE_PROVIDER_SITE_OTHER): Payer: Medicare HMO | Admitting: Family

## 2022-05-11 VITALS — BP 104/72 | HR 78 | Temp 98.0°F | Resp 16 | Wt 192.0 lb

## 2022-05-11 DIAGNOSIS — E1142 Type 2 diabetes mellitus with diabetic polyneuropathy: Secondary | ICD-10-CM | POA: Diagnosis not present

## 2022-05-11 DIAGNOSIS — R300 Dysuria: Secondary | ICD-10-CM

## 2022-05-11 DIAGNOSIS — F418 Other specified anxiety disorders: Secondary | ICD-10-CM

## 2022-05-11 DIAGNOSIS — I25119 Atherosclerotic heart disease of native coronary artery with unspecified angina pectoris: Secondary | ICD-10-CM

## 2022-05-11 DIAGNOSIS — I10 Essential (primary) hypertension: Secondary | ICD-10-CM

## 2022-05-11 DIAGNOSIS — E785 Hyperlipidemia, unspecified: Secondary | ICD-10-CM | POA: Diagnosis not present

## 2022-05-11 LAB — BASIC METABOLIC PANEL
BUN: 13 mg/dL (ref 6–23)
CO2: 30 mEq/L (ref 19–32)
Calcium: 8.7 mg/dL (ref 8.4–10.5)
Chloride: 102 mEq/L (ref 96–112)
Creatinine, Ser: 0.78 mg/dL (ref 0.40–1.50)
GFR: 103.27 mL/min (ref >60.00)
Glucose, Bld: 112 mg/dL — ABNORMAL HIGH (ref 70–99)
Potassium: 3.8 mEq/L (ref 3.5–5.1)
Sodium: 139 mEq/L (ref 135–145)

## 2022-05-11 LAB — POC URINALSYSI DIPSTICK (AUTOMATED)
Blood, UA: NEGATIVE
Glucose, UA: NEGATIVE
Leukocytes, UA: NEGATIVE
Nitrite, UA: NEGATIVE
Protein, UA: NEGATIVE
Spec Grav, UA: 1.02 (ref 1.010–1.025)
Urobilinogen, UA: 0.2 E.U./dL
pH, UA: 5 (ref 5.0–8.0)

## 2022-05-11 LAB — HEMOGLOBIN A1C: Hgb A1c MFr Bld: 6.2 % (ref 4.6–6.5)

## 2022-05-11 MED ORDER — LISINOPRIL 10 MG PO TABS: 5.0000 mg | ORAL_TABLET | Freq: Every day | ORAL | 0 refills | Status: DC

## 2022-05-11 NOTE — Progress Notes (Signed)
Subjective:     Patient ID: Cody Raymond, male    DOB: 10-12-1970, 51 y.o.   MRN: [id number]  Chief Complaint  Patient presents with   Hypertension    Here for follow up   Diabetes    Here for follow up    Hypertension  Diabetes   Patient is in today for follow up.  BPH- continues flomax. Nocturia 3x at night.    HTN- maintained on metoprolol and lisinopril.   BP Readings from Last 3 Encounters:  05/11/22 104/72  02/08/22 (!) 116/58  01/31/22 110/60   DM2- reports sugars have been stable at home. 80 fasting, post prandial 122. He is also exercising regularly- Taekwondo. Lab Results  Component Value Date   HGBA1C 6.8 (H) 12/28/2021   HGBA1C 6.1 03/31/2021   HGBA1C 5.5 05/18/2020   Lab Results  Component Value Date   MICROALBUR 1.15 11/20/2013   LDLCALC 33 12/28/2021   CREATININE 0.82 12/28/2021   Insomnia- uses ambien prn.  He tries not to use every night.   Hyperlipidemia- maintained on lipitor '80mg'$ .  Lab Results  Component Value Date   CHOL 97 12/28/2021   HDL 40.90 12/28/2021   LDLCALC 33 12/28/2021   LDLDIRECT 36 04/19/2019   TRIG 113.0 12/28/2021   CHOLHDL 2 12/28/2021     Pt reports some mild dysuria.   Depression-maintained on cymbalta.  Increased stress with family issues.   Health Maintenance Due  Topic Date Due   COVID-19 Vaccine (4 - Pfizer series) 10/13/2020   Zoster Vaccines- Shingrix (1 of 2) Never done   TETANUS/TDAP  04/04/2022   INFLUENZA VACCINE  04/19/2022    Past Medical History:  Diagnosis Date   Aneurysm ([place])    Asthma    Blood transfusion    CAD (coronary artery disease), non obstructive on cath 2011 04/05/2012   Coronary artery disease    Diabetes mellitus    Diabetes mellitus without complication ([place])    Enlarged prostate    GERD (gastroesophageal reflux disease)    H/O syncope    Heart disease    History of repair of patent ductus arteriosus 07/23/2016   Hyperlipidemia    Hypertension    Neuromuscular  disorder ([place])    DJD   Refusal of blood transfusions as patient is Jehovah's Witness     Past Surgical History:  Procedure Laterality Date   APPENDECTOMY     BACK SURGERY     CARDIAC CATHETERIZATION  2007   Clean Cardiac Cath ([place]), with RCA 30% narrowing likely due to catheter induced spasm in 09/02/10.   CARDIAC CATHETERIZATION  09/02/2010   mod. nonobstructive disease in the RCA and CX, tortuous LAD   COLONOSCOPY W/ POLYPECTOMY  03/17/11   diminutive polyp   LOOP RECORDER EXPLANT N/A 01/14/2014   Procedure: LOOP RECORDER EXPLANT;  Surgeon: Sanda Klein, MD;  Location: [facility];  Service: Cardiovascular;  Laterality: N/A;   LOOP RECORDER IMPLANT  04/06/2012   Reveal XT 4529   LOOP RECORDER IMPLANT N/A 04/06/2012   Procedure: LOOP RECORDER IMPLANT;  Surgeon: Sanda Klein, MD;  Location: [facility];  Service: Cardiovascular;  Laterality: N/A;   NM MYOCAR PERF WALL MOTION  01/25/2008   mild anteroapical wall ischemia   PATENT DUCTUS ARTERIOUS REPAIR     at age 82    Family History  Problem Relation Age of Onset   Colon cancer Paternal Grandfather    Prostate cancer Paternal Grandfather    Aneurysm Father  Heart attack Father 94   Coronary artery disease Father    Colon polyps Mother    Heart disease Mother    Coronary artery disease Mother    Migraines Mother    Breast cancer Maternal Grandmother    Colon cancer Paternal Uncle        x 2   Stomach cancer Brother    Liver disease Other        unsure who it was   Allergies Daughter     Social History   Socioeconomic History   Marital status: Married    Spouse name: Not on file   Number of children: Not on file   Years of education: Not on file   Highest education level: Not on file  Occupational History   Not on file  Tobacco Use   Smoking status: Never   Smokeless tobacco: Never  Vaping Use   Vaping Use: Never used  Substance and Sexual Activity   Alcohol use: No   Drug use: No    Sexual activity: Yes  Other Topics Concern   Not on file  Social History Narrative   ** Merged History Encounter **       Holter monitor 08/2010: PVCs and sinus tachy.   Sleep Study (02/2008): mild sleep apnea, no indication for CPAP.   Social Determinants of Health   Financial Resource Strain: Medium Risk (12/06/2021)   Overall Financial Resource Strain (CARDIA)    Difficulty of Paying Living Expenses: Somewhat hard  Food Insecurity: Food Insecurity Present (12/06/2021)   Hunger Vital Sign    Worried About Running Out of Food in the Last Year: Sometimes true    Ran Out of Food in the Last Year: Sometimes true  Transportation Needs: No Transportation Needs (12/06/2021)   PRAPARE - Hydrologist (Medical): No    Lack of Transportation (Non-Medical): No  Physical Activity: Insufficiently Active (12/06/2021)   Exercise Vital Sign    Days of Exercise per Week: 3 days    Minutes of Exercise per Session: 20 min  Stress: Stress Concern Present (12/06/2021)   [place]    Feeling of Stress : To some extent  Social Connections: Moderately Integrated (12/06/2021)   Social Connection and Isolation Panel [NHANES]    Frequency of Communication with Friends and Family: More than three times a week    Frequency of Social Gatherings with Friends and Family: More than three times a week    Attends Religious Services: More than 4 times per year    Active Member of [employer] or Organizations: No    Attends Archivist Meetings: Never    Marital Status: Married  Human resources officer Violence: Not At Risk (12/06/2021)   Humiliation, Afraid, Rape, and Kick questionnaire    Fear of Current or Ex-Partner: No    Emotionally Abused: No    Physically Abused: No    Sexually Abused: No    Outpatient Medications Prior to Visit  Medication Sig Dispense Refill   Ascorbic Acid (VITAMIN C) 1000 MG tablet Take 1,000 mg  by mouth daily.     aspirin EC 81 MG EC tablet Take 1 tablet (81 mg total) by mouth daily. 30 tablet 0   atorvastatin (LIPITOR) 80 MG tablet Take 1 tablet (80 mg total) by mouth daily. 90 tablet 0   betamethasone dipropionate (DIPROLENE) 0.05 % cream Apply topically 2 (two) times daily. 30 g 0   cetirizine (  ZYRTEC) 10 MG tablet Take 10 mg by mouth daily.     clonazePAM (KLONOPIN) 1 MG tablet TAKE 1/2 TABLET EVERY MORNING AND TAKE 1 TABLET EVERY EVENING Strength: 1 mg 45 tablet 0   DULoxetine (CYMBALTA) 60 MG capsule Take 1 capsule (60 mg total) by mouth daily. 90 capsule 1   ezetimibe (ZETIA) 10 MG tablet TAKE 1 TABLET EVERY DAY 90 tablet 1   fluticasone (FLONASE) 50 MCG/ACT nasal spray USE 2 SPRAYS IN EACH NOSTRIL ONE TIME DAILY 48 g 0   fluticasone-salmeterol (WIXELA INHUB) 250-50 MCG/ACT AEPB Inhale 1 puff into the lungs in the morning and at bedtime. 60 each 6   gabapentin (NEURONTIN) 300 MG capsule TAKE 1 CAPSULE AT 6AM, 11:30AM, 4:30PM. TAKE 3 CAPSULES AT 10PM 540 capsule 3   Icosapent Ethyl (VASCEPA) 1 g CAPS Take 2 capsules (2 g total) by mouth 2 (two) times daily. 120 capsule 0   ipratropium (ATROVENT) 0.03 % nasal spray Place 2 sprays into both nostrils every 12 (twelve) hours. 30 mL 12   ipratropium-albuterol (DUONEB) 0.5-2.5 (3) MG/3ML SOLN INHALE 1 VIAL VIA NEBULIZATION EVERY 6 HOURS AS NEEDED 360 mL 1   isosorbide mononitrate (IMDUR) 30 MG 24 hr tablet TAKE 1/2 TABLET BY MOUTH DAILY 45 tablet 1   ketoconazole (NIZORAL) 2 % shampoo Apply topically 2 (two) times a week. 360 mL 1   meloxicam (MOBIC) 7.5 MG tablet TAKE 1 TABLET DAILY AS NEEDED FOR PAIN 90 tablet 1   metFORMIN (GLUCOPHAGE-XR) 500 MG 24 hr tablet Take 2 tablets (1,000 mg total) by mouth daily with breakfast. 180 tablet 3   metoprolol tartrate (LOPRESSOR) 50 MG tablet TAKE 1 TABLET TWICE DAILY 180 tablet 0   montelukast (SINGULAIR) 10 MG tablet TAKE 1 TABLET AT BEDTIME 90 tablet 1   Multiple Vitamin (MULTIVITAMIN WITH  MINERALS) TABS tablet Take 1 tablet by mouth daily.     nitroGLYCERIN (NITROSTAT) 0.4 MG SL tablet Place 1 tablet (0.4 mg total) under the tongue every 5 (five) minutes as needed for chest pain. 25 tablet PRN   ONETOUCH VERIO test strip CHECK BLOOD SUGAR 3 TIMES DAILY 300 strip 3   pantoprazole (PROTONIX) 40 MG tablet TAKE 1 TABLET (40 MG TOTAL) BY MOUTH DAILY. 90 tablet 1   promethazine-dextromethorphan (PROMETHAZINE-DM) 6.25-15 MG/5ML syrup Take 5 mLs by mouth 4 (four) times daily as needed. 118 mL 0   silver sulfADIAZINE (SILVADENE) 1 % cream Apply 1 application topically daily. 50 g 0   tamsulosin (FLOMAX) 0.4 MG CAPS capsule TAKE 1 CAPSULE EVERY DAY 90 capsule 0   tiZANidine (ZANAFLEX) 2 MG tablet TAKE 1 TABLET EVERY 6 HOURS AS NEEDED FOR MUSCLE SPASM(S) 360 tablet 1   traMADol (ULTRAM) 50 MG tablet TAKE 1 TABLET TWICE DAILY 60 tablet 5   traZODone (DESYREL) 100 MG tablet TAKE 1 TABLET AT BEDTIME 90 tablet 3   zolpidem (AMBIEN) 10 MG tablet TAKE 1 TABLET AT BEDTIME AS NEEDED FOR SLEEP 30 tablet 0   Zoster Vaccine Adjuvanted [facility]) injection Inject 0.'5mg'$  IM now and repeat in 2-6 months. 0.5 mL 1   lisinopril (ZESTRIL) 10 MG tablet TAKE 1 TABLET EVERY DAY 90 tablet 0   No facility-administered medications prior to visit.    Allergies  Allergen Reactions   Benadryl [Diphenhydramine Hcl]     "drives me nuts"    ROS See HPI    Objective:    Physical Exam Constitutional:      General: He is not in acute  distress.    Appearance: He is well-developed.  HENT:     Head: Normocephalic and atraumatic.  Cardiovascular:     Rate and Rhythm: Normal rate and regular rhythm.     Heart sounds: No murmur heard. Pulmonary:     Effort: Pulmonary effort is normal. No respiratory distress.     Breath sounds: Normal breath sounds. No wheezing or rales.  Skin:    General: Skin is warm and dry.  Neurological:     Mental Status: He is alert and oriented to person, place, and time.   Psychiatric:        Behavior: Behavior normal.        Thought Content: Thought content normal.     BP 104/72 (BP Location: Right Arm, Patient Position: Sitting, Cuff Size: Small)   Pulse 78   Temp 98 F (36.7 C) (Oral)   Resp 16   Wt 192 lb (87.1 kg)   SpO2 100%   BMI 29.19 kg/m  Wt Readings from Last 3 Encounters:  05/11/22 192 lb (87.1 kg)  02/08/22 197 lb (89.4 kg)  01/31/22 197 lb 6.4 oz (89.5 kg)       Assessment & Plan:   Problem List Items Addressed This Visit       Unprioritized   CAD (coronary artery disease), non obstructive on cath 2011 (Chronic)    Clinically stable. Management per cardiology.       Relevant Medications   lisinopril (ZESTRIL) 10 MG tablet   Type 2 diabetes, controlled, with peripheral neuropathy (HCC) - Primary    Last A1C had risen but still at goal. Continue metformin.       Relevant Medications   lisinopril (ZESTRIL) 10 MG tablet   Other Relevant Orders   Hemoglobin P2Z   Basic metabolic panel   Essential hypertension    BP a bit low.  Notes that he had some dizziness at Taekwondo the other night.  Admits to not drinking enough water. Will increase fluid intake and advised him to cut lisinopril from '10mg'$  to 5 mg. Will continue current dose of metoprolol. He will continue to monitor his blood pressure at home. He will let me know if SBP starts to run >130.       Relevant Medications   lisinopril (ZESTRIL) 10 MG tablet   Dyslipidemia    LDL at goal last visit. Continue current dose of atorvastatin.       Depression with anxiety    Fair control. Continues prn klonopin and cymbalta. Continue same.       Other Visit Diagnoses     Dysuria       Relevant Orders   POCT Urinalysis Dipstick (Automated) (Completed)   Urine Culture       I have changed Deyonte Carden "Lenny"'s lisinopril. I am also having him maintain his cetirizine, multivitamin with minerals, vitamin C, aspirin EC, icosapent Ethyl, betamethasone  dipropionate, silver sulfADIAZINE, isosorbide mononitrate, nitroGLYCERIN, ketoconazole, Zoster Vaccine Adjuvanted, metFORMIN, OneTouch Verio, gabapentin, ipratropium-albuterol, traZODone, ipratropium, fluticasone-salmeterol, DULoxetine, fluticasone, promethazine-dextromethorphan, pantoprazole, atorvastatin, clonazePAM, traMADol, zolpidem, metoprolol tartrate, tamsulosin, tiZANidine, montelukast, meloxicam, and ezetimibe.  Meds ordered this encounter  Medications   lisinopril (ZESTRIL) 10 MG tablet    Sig: Take 0.5 tablets (5 mg total) by mouth daily.    Dispense:  90 tablet    Refill:  0    Order Specific Question:   Supervising Provider    Answer:   Penni Homans A [[id number]]

## 2022-05-11 NOTE — Patient Instructions (Addendum)
Please decrease lisinopril to 1/2 tab once daily.

## 2022-05-11 NOTE — Assessment & Plan Note (Signed)
Last A1C had risen but still at goal. Continue metformin.

## 2022-05-11 NOTE — Assessment & Plan Note (Signed)
Clinically stable. Management per cardiology.  

## 2022-05-11 NOTE — Assessment & Plan Note (Signed)
LDL at goal last visit. Continue current dose of atorvastatin.

## 2022-05-11 NOTE — Assessment & Plan Note (Signed)
BP a bit low.  Notes that he had some dizziness at Taekwondo the other night.  Admits to not drinking enough water. Will increase fluid intake and advised him to cut lisinopril from '10mg'$  to 5 mg. Will continue current dose of metoprolol. He will continue to monitor his blood pressure at home. He will let me know if SBP starts to run >130.

## 2022-05-11 NOTE — Assessment & Plan Note (Signed)
Fair control. Continues prn klonopin and cymbalta. Continue same.

## 2022-05-12 LAB — URINE CULTURE
MICRO NUMBER:: 13820401
Result:: NO GROWTH
SPECIMEN QUALITY:: ADEQUATE

## 2022-05-25 ENCOUNTER — Other Ambulatory Visit: Payer: Self-pay | Admitting: Family

## 2022-05-29 ENCOUNTER — Other Ambulatory Visit: Payer: Self-pay | Admitting: Family

## 2022-05-29 DIAGNOSIS — E785 Hyperlipidemia, unspecified: Secondary | ICD-10-CM

## 2022-05-30 ENCOUNTER — Other Ambulatory Visit: Payer: Self-pay | Admitting: Family

## 2022-05-30 DIAGNOSIS — F418 Other specified anxiety disorders: Secondary | ICD-10-CM

## 2022-05-30 MED ORDER — ATORVASTATIN CALCIUM 80 MG PO TABS: 80.0000 mg | ORAL_TABLET | Freq: Every day | ORAL | 0 refills | Status: DC

## 2022-05-30 MED ORDER — LISINOPRIL 10 MG PO TABS: 5.0000 mg | ORAL_TABLET | Freq: Every day | ORAL | 0 refills | Status: DC

## 2022-05-30 MED ORDER — ZOLPIDEM TARTRATE 10 MG PO TABS
10.0000 mg | ORAL_TABLET | Freq: Every evening | ORAL | 0 refills | Status: DC | PRN
Start: 2022-05-30 — End: 2022-06-26

## 2022-05-30 NOTE — Telephone Encounter (Signed)
Requesting: Ambien '10mg'$   Contract: 08/21/2019 UDS: 12/30/21 Last Visit: 05/11/22 Next Visit: None Last Refill on Ambien: 04/25/22 #30 and 0RF  Please Advise

## 2022-06-02 ENCOUNTER — Emergency Department (HOSPITAL_COMMUNITY)
Admission: EM | Admit: 2022-06-02 | Discharge: 2022-06-02 | Disposition: A | Discharge status: Home / Self Care | Payer: Medicare HMO | Attending: Emergency Medicine | Admitting: Emergency Medicine

## 2022-06-02 ENCOUNTER — Emergency Department (HOSPITAL_COMMUNITY): Payer: Medicare HMO

## 2022-06-02 ENCOUNTER — Encounter (HOSPITAL_COMMUNITY): Payer: Self-pay

## 2022-06-02 ENCOUNTER — Other Ambulatory Visit: Payer: Self-pay

## 2022-06-02 DIAGNOSIS — R072 Precordial pain: Secondary | ICD-10-CM | POA: Diagnosis not present

## 2022-06-02 DIAGNOSIS — Z7951 Long term (current) use of inhaled steroids: Secondary | ICD-10-CM | POA: Diagnosis not present

## 2022-06-02 DIAGNOSIS — E119 Type 2 diabetes mellitus without complications: Secondary | ICD-10-CM | POA: Diagnosis not present

## 2022-06-02 DIAGNOSIS — R1013 Epigastric pain: Secondary | ICD-10-CM | POA: Insufficient documentation

## 2022-06-02 DIAGNOSIS — R079 Chest pain, unspecified: Secondary | ICD-10-CM

## 2022-06-02 DIAGNOSIS — R0602 Shortness of breath: Secondary | ICD-10-CM | POA: Insufficient documentation

## 2022-06-02 DIAGNOSIS — Z79899 Other long term (current) drug therapy: Secondary | ICD-10-CM | POA: Diagnosis not present

## 2022-06-02 DIAGNOSIS — Z7984 Long term (current) use of oral hypoglycemic drugs: Secondary | ICD-10-CM | POA: Insufficient documentation

## 2022-06-02 DIAGNOSIS — Z7982 Long term (current) use of aspirin: Secondary | ICD-10-CM | POA: Insufficient documentation

## 2022-06-02 DIAGNOSIS — I491 Atrial premature depolarization: Secondary | ICD-10-CM | POA: Diagnosis not present

## 2022-06-02 DIAGNOSIS — I4519 Other right bundle-branch block: Secondary | ICD-10-CM | POA: Diagnosis not present

## 2022-06-02 DIAGNOSIS — R61 Generalized hyperhidrosis: Secondary | ICD-10-CM | POA: Insufficient documentation

## 2022-06-02 DIAGNOSIS — R110 Nausea: Secondary | ICD-10-CM | POA: Insufficient documentation

## 2022-06-02 DIAGNOSIS — I712 Thoracic aortic aneurysm, without rupture, unspecified: Secondary | ICD-10-CM | POA: Diagnosis not present

## 2022-06-02 DIAGNOSIS — I10 Essential (primary) hypertension: Secondary | ICD-10-CM | POA: Diagnosis not present

## 2022-06-02 DIAGNOSIS — J45909 Unspecified asthma, uncomplicated: Secondary | ICD-10-CM | POA: Diagnosis not present

## 2022-06-02 DIAGNOSIS — R0789 Other chest pain: Secondary | ICD-10-CM | POA: Diagnosis not present

## 2022-06-02 DIAGNOSIS — I2510 Atherosclerotic heart disease of native coronary artery without angina pectoris: Secondary | ICD-10-CM | POA: Diagnosis not present

## 2022-06-02 DIAGNOSIS — Q250 Patent ductus arteriosus: Secondary | ICD-10-CM | POA: Diagnosis not present

## 2022-06-02 LAB — LACTIC ACID, PLASMA: Lactic Acid, Venous: 1.8 mmol/L (ref 0.5–1.9)

## 2022-06-02 LAB — COMPREHENSIVE METABOLIC PANEL
ALT: 21 U/L (ref 0–44)
AST: 26 U/L (ref 15–41)
Albumin: 3.8 g/dL (ref 3.5–5.0)
Alkaline Phosphatase: 85 U/L (ref 38–126)
Anion gap: 7 (ref 5–15)
BUN: 15 mg/dL (ref 6–20)
CO2: 25 mmol/L (ref 22–32)
Calcium: 9.3 mg/dL (ref 8.9–10.3)
Chloride: 105 mmol/L (ref 98–111)
Creatinine, Ser: 0.78 mg/dL (ref 0.61–1.24)
GFR, Estimated: >60 mL/min (ref >60)
Glucose, Bld: 162 mg/dL — ABNORMAL HIGH (ref 70–99)
Potassium: 4.1 mmol/L (ref 3.5–5.1)
Sodium: 137 mmol/L (ref 135–145)
Total Bilirubin: 1 mg/dL (ref 0.3–1.2)
Total Protein: 6.5 g/dL (ref 6.5–8.1)

## 2022-06-02 LAB — CBC WITH DIFFERENTIAL/PLATELET
Abs Immature Granulocytes: 0.02 10*3/uL (ref 0.00–0.07)
Basophils Absolute: 0 10*3/uL (ref 0.0–0.1)
Basophils Relative: 0 %
Eosinophils Absolute: 0 10*3/uL (ref 0.0–0.5)
Eosinophils Relative: 0 %
HCT: 39.7 % (ref 39.0–52.0)
Hemoglobin: 13.3 g/dL (ref 13.0–17.0)
Immature Granulocytes: 0 %
Lymphocytes Relative: 14 %
Lymphs Abs: 1 10*3/uL (ref 0.7–4.0)
MCH: 27.1 pg (ref 26.0–34.0)
MCHC: 33.5 g/dL (ref 30.0–36.0)
MCV: 81 fL (ref 80.0–100.0)
Monocytes Absolute: 0.4 10*3/uL (ref 0.1–1.0)
Monocytes Relative: 6 %
Neutro Abs: 5.4 10*3/uL (ref 1.7–7.7)
Neutrophils Relative %: 80 %
Platelets: 272 10*3/uL (ref 150–400)
RBC: 4.9 MIL/uL (ref 4.22–5.81)
RDW: 16 % — ABNORMAL HIGH (ref 11.5–15.5)
WBC: 6.8 10*3/uL (ref 4.0–10.5)
nRBC: 0 % (ref 0.0–0.2)

## 2022-06-02 LAB — TROPONIN I (HIGH SENSITIVITY)
Troponin I (High Sensitivity): 3 ng/L (ref <18)
Troponin I (High Sensitivity): 4 ng/L (ref <18)

## 2022-06-02 LAB — LIPASE, BLOOD: Lipase: 109 U/L — ABNORMAL HIGH (ref 11–51)

## 2022-06-02 MED ORDER — IOHEXOL 350 MG/ML SOLN
100.0000 mL | Freq: Once | INTRAVENOUS | Status: AC | PRN
  Administered 2022-06-02: 100 mL via INTRAVENOUS

## 2022-06-02 MED ORDER — METOPROLOL TARTRATE 75 MG PO TABS: 50.0000 mg | ORAL_TABLET | Freq: Two times a day (BID) | ORAL | 0 refills | Status: DC

## 2022-06-02 MED ORDER — METOPROLOL TARTRATE 25 MG PO TABS: 75.0000 mg | ORAL_TABLET | Freq: Two times a day (BID) | ORAL | Status: DC

## 2022-06-02 MED ORDER — SUCRALFATE 1 G PO TABS: 1.0000 g | ORAL_TABLET | Freq: Three times a day (TID) | ORAL | 0 refills | Status: DC

## 2022-06-02 NOTE — Consult Note (Addendum)
Cardiology Consult Note   Patient ID: Cody Raymond MRN: [id number]; DOB: 11/28/1970   Admission date: 06/02/2022  PCP:  [name], NP   [place] Providers Cardiologist:  Quay Burow, MD   {   Chief Complaint:  chest pain  Patient Profile:   Cody Raymond is a 51 y.o. male with PMH of asthma, HTN, HLD, type 2 DM, CAD, anxiety with depression, OSA,  who is being seen 06/02/2022 for the evaluation of chest pain.  History of Present Illness:   Cody Raymond with above past medical history presented to the ER today with chief complaints of chest pain.  Pain started yesterday 9am while he was walking.  Pain is heavy pressure in quality, 10/10.  Pain has resolved in the afternoon after he took aspirin and metoprolol, he had no nitro left at home.  He reports associated diaphoresis, nausea, lightheadedness, seeing fuzzy light , and head pressure. 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 He states he has a lot social issues currently that he is fighting for custody for his daughter. He is on anxiety medications and felt this could be a problem causing him to have chest pain. He states he get chest pain almost weekly over the past few month. He is wondering if he has a clot in his brain. He is taking out of her pill box during encounter and attempting to take his anxiety medication and metoprolol.   Admission diagnostic revealed glucose 162, otherwise unremarkable CMP.  High sensitive troponin negative x2 (3>4).  Lactic acid 1.8.  CBC differential unremarkable. CTA torso showed no evidence of thoracic/abdominal aortic dissection, distal aortic arch just distal to ductus arteriosus 3.4 cm in diameter.  He followed cardiology Dr. Sallyanne Kuster, Dr. Einar Gip, and eventually transferred to Dr. Gwenlyn Found due to insurance issues.  Cardiac catheterization from 2011 revealed nonobstructive CAD of left circumflex and RCA, LVEF 65%.  He had  a recurrent atypical chest pain in the past and underwent multiple ischemic work-up. Stress Myoview from 11/12/2014 was low risk, negative for ischemia, a small, moderate intensity, fixed distal anterior/apical defect suggests of prominent thinning versus soft tissue attenuation.  Coronary CT 02/26/2019 showed calcium score of 0, normal coronary arteries.  Stress Myoview at [facility] from 04/08/2020 was normal without ischemia.   He is a Restaurant manager, fast food. He had reported that his mom had a history of HOCM requiring surgery. He has had history of syncope with overall negative work-up including a loop recorder implant, eventually it was explanted.  Last 2-week event monitor was done 03/12/2020 which revealed sinus rhythm/sinus bradycardia, sinus tachycardia, short runs of PSVT.  He had stopped taking 3 cups of coffee daily.  Last echocardiogram from 04/09/2020 showed LVEF 55 to 65%, no regional wall motion abnormality, normal RV, no significant valvular disease.  Patient last seen by APP on 12/22/2021 in the office, complained occasional skipped heartbeats, cardia mobile device had evidence of PAC and PVCs.  He was not able to take high-dose metoprolol due to fatigue.  His DOE was felt chronic and related to asthma and he had some expiratory wheezing on exam.  He was advised to take extra half tab of metoprolol as needed when he experienced increased palpitation.   Past Medical History:  Diagnosis Date   Aneurysm ([place])    Asthma    Blood transfusion    CAD (coronary artery disease), non obstructive on cath 2011 04/05/2012  Coronary artery disease    Diabetes mellitus    Diabetes mellitus without complication ([place])    Enlarged prostate    GERD (gastroesophageal reflux disease)    H/O syncope    Heart disease    History of repair of patent ductus arteriosus 07/23/2016   Hyperlipidemia    Hypertension    Neuromuscular disorder ([place])    DJD   Refusal of blood transfusions as patient is Jehovah's  Witness     Past Surgical History:  Procedure Laterality Date   APPENDECTOMY     BACK SURGERY     CARDIAC CATHETERIZATION  2007   Clean Cardiac Cath ([place]), with RCA 30% narrowing likely due to catheter induced spasm in 09/02/10.   CARDIAC CATHETERIZATION  09/02/2010   mod. nonobstructive disease in the RCA and CX, tortuous LAD   COLONOSCOPY W/ POLYPECTOMY  03/17/11   diminutive polyp   LOOP RECORDER EXPLANT N/A 01/14/2014   Procedure: LOOP RECORDER EXPLANT;  Surgeon: Sanda Klein, MD;  Location: [facility];  Service: Cardiovascular;  Laterality: N/A;   LOOP RECORDER IMPLANT  04/06/2012   Reveal XT [id number]   LOOP RECORDER IMPLANT N/A 04/06/2012   Procedure: LOOP RECORDER IMPLANT;  Surgeon: Sanda Klein, MD;  Location: [facility];  Service: Cardiovascular;  Laterality: N/A;   NM MYOCAR PERF WALL MOTION  01/25/2008   mild anteroapical wall ischemia   PATENT DUCTUS ARTERIOUS REPAIR     at age 11     Medications Prior to Admission: Prior to Admission medications   Medication Sig Start Date End Date Taking? Authorizing Provider  Ascorbic Acid (VITAMIN C) 1000 MG tablet Take 1,000 mg by mouth daily.    [provider]  aspirin EC 81 MG EC tablet Take 1 tablet (81 mg total) by mouth daily. 12/16/18   Dessa Phi, DO  atorvastatin (LIPITOR) 80 MG tablet Take 1 tablet (80 mg total) by mouth daily. 05/30/22 08/28/22  [name], NP  betamethasone dipropionate (DIPROLENE) 0.05 % cream Apply topically 2 (two) times daily. 05/31/19   [name], NP  cetirizine (ZYRTEC) 10 MG tablet Take 10 mg by mouth daily.    [provider]  clonazePAM (KLONOPIN) 1 MG tablet TAKE 1/2 TABLET EVERY MORNING  AND TAKE 1 TABLET EVERY EVENING 05/26/22   [name], NP  DULoxetine (CYMBALTA) 60 MG capsule TAKE 1 CAPSULE EVERY DAY 05/31/22   [name], NP  ezetimibe (ZETIA) 10 MG tablet TAKE 1 TABLET EVERY DAY 05/09/22   [name], NP   fluticasone (FLONASE) 50 MCG/ACT nasal spray USE 2 SPRAYS IN EACH NOSTRIL ONE TIME DAILY 01/19/22   [name], NP  fluticasone-salmeterol (WIXELA INHUB) 250-50 MCG/ACT AEPB Inhale 1 puff into the lungs in the morning and at bedtime. 11/16/21   Freddi Starr, MD  gabapentin (NEURONTIN) 300 MG capsule TAKE 1 CAPSULE AT 6AM, 11:30AM, 4:30PM. TAKE 3 CAPSULES AT 10PM 10/01/21   [name], NP  Icosapent Ethyl (VASCEPA) 1 g CAPS Take 2 capsules (2 g total) by mouth 2 (two) times daily. 12/19/18   Hilty, Nadean Corwin, MD  ipratropium (ATROVENT) 0.03 % nasal spray Place 2 sprays into both nostrils every 12 (twelve) hours. 10/14/21   Freddi Starr, MD  ipratropium-albuterol (DUONEB) 0.5-2.5 (3) MG/3ML SOLN INHALE 1 VIAL VIA NEBULIZATION EVERY 6 HOURS AS NEEDED 10/01/21   [name], NP  isosorbide mononitrate (IMDUR) 30 MG 24 hr tablet TAKE 1/2 TABLET BY MOUTH DAILY 01/21/20   Darreld Mclean,  PA-C  ketoconazole (NIZORAL) 2 % shampoo Apply topically 2 (two) times a week. 11/12/20   [name], NP  lisinopril (ZESTRIL) 10 MG tablet Take 0.5 tablets (5 mg total) by mouth daily. 05/30/22   [name], NP  meloxicam (MOBIC) 7.5 MG tablet TAKE 1 TABLET DAILY AS NEEDED FOR PAIN 05/09/22   [name], NP  metFORMIN (GLUCOPHAGE-XR) 500 MG 24 hr tablet Take 2 tablets (1,000 mg total) by mouth daily with breakfast. 09/29/21   [name], NP  metoprolol tartrate (LOPRESSOR) 50 MG tablet TAKE 1 TABLET TWICE DAILY 05/08/22   [name], NP  montelukast (SINGULAIR) 10 MG tablet TAKE 1 TABLET AT BEDTIME 05/09/22   [name], NP  Multiple Vitamin (MULTIVITAMIN WITH MINERALS) TABS tablet Take 1 tablet by mouth daily.    [provider]  nitroGLYCERIN (NITROSTAT) 0.4 MG SL tablet Place 1 tablet (0.4 mg total) under the tongue every 5 (five) minutes as needed for chest pain. 01/21/20   Sande [facility] E, PA-C  ONETOUCH VERIO test strip CHECK  BLOOD SUGAR 3 TIMES DAILY 10/01/21   [name], NP  pantoprazole (PROTONIX) 40 MG tablet TAKE 1 TABLET (40 MG TOTAL) BY MOUTH DAILY. 02/24/22   [name], NP  promethazine-dextromethorphan (PROMETHAZINE-DM) 6.25-15 MG/5ML syrup Take 5 mLs by mouth 4 (four) times daily as needed. 01/31/22   Ann Held, DO  silver sulfADIAZINE (SILVADENE) 1 % cream Apply 1 application topically daily. 08/21/19   [name], NP  tamsulosin (FLOMAX) 0.4 MG CAPS capsule TAKE 1 CAPSULE EVERY DAY 05/08/22   [name], NP  tiZANidine (ZANAFLEX) 2 MG tablet TAKE 1 TABLET EVERY 6 HOURS AS NEEDED FOR MUSCLE SPASM(S) 05/09/22   Bayard Hugger, NP  traMADol (ULTRAM) 50 MG tablet TAKE 1 TABLET TWICE DAILY 04/28/22   Kirsteins, Luanna Salk, MD  traZODone (DESYREL) 100 MG tablet TAKE 1 TABLET AT BEDTIME 10/01/21   [name], NP  zolpidem (AMBIEN) 10 MG tablet Take 1 tablet (10 mg total) by mouth at bedtime as needed. for sleep 05/30/22   [name], NP  Zoster Vaccine Adjuvanted [facility]) injection Inject 0.'5mg'$  IM now and repeat in 2-6 months. 03/31/21   [name], NP     Allergies:    Allergies  Allergen Reactions   Benadryl [Diphenhydramine Hcl]     "drives me nuts"    Social History:   Social History   Socioeconomic History   Marital status: Married    Spouse name: Not on file   Number of children: Not on file   Years of education: Not on file   Highest education level: Not on file  Occupational History   Not on file  Tobacco Use   Smoking status: Never   Smokeless tobacco: Never  Vaping Use   Vaping Use: Never used  Substance and Sexual Activity   Alcohol use: No   Drug use: No   Sexual activity: Yes  Other Topics Concern   Not on file  Social History Narrative   ** Merged History Encounter **       Holter monitor 08/2010: PVCs and sinus tachy.   Sleep Study (02/2008): mild sleep apnea, no indication for CPAP.   Social Determinants  of Health   Financial Resource Strain: Medium Risk (12/06/2021)   Overall Financial Resource Strain (CARDIA)    Difficulty of Paying Living Expenses: Somewhat hard  Food Insecurity: Food Insecurity Present (12/06/2021)   Hunger Vital Sign    Worried About Running Out of  Food in the Last Year: Sometimes true    Ran Out of Food in the Last Year: Sometimes true  Transportation Needs: No Transportation Needs (12/06/2021)   PRAPARE - Hydrologist (Medical): No    Lack of Transportation (Non-Medical): No  Physical Activity: Insufficiently Active (12/06/2021)   Exercise Vital Sign    Days of Exercise per Week: 3 days    Minutes of Exercise per Session: 20 min  Stress: Stress Concern Present (12/06/2021)   [place]    Feeling of Stress : To some extent  Social Connections: Moderately Integrated (12/06/2021)   Social Connection and Isolation Panel [NHANES]    Frequency of Communication with Friends and Family: More than three times a week    Frequency of Social Gatherings with Friends and Family: More than three times a week    Attends Religious Services: More than 4 times per year    Active Member of [employer] or Organizations: No    Attends Archivist Meetings: Never    Marital Status: Married  Human resources officer Violence: Not At Risk (12/06/2021)   Humiliation, Afraid, Rape, and Kick questionnaire    Fear of Current or Ex-Partner: No    Emotionally Abused: No    Physically Abused: No    Sexually Abused: No    Family History:   The patient's family history includes Allergies in his daughter; Aneurysm in his father; Breast cancer in his maternal grandmother; Colon cancer in his paternal grandfather and paternal uncle; Colon polyps in his mother; Coronary artery disease in his father and mother; Heart attack (age of onset: 34) in his father; Heart disease in his mother; Liver disease in an other  family member; Migraines in his mother; Prostate cancer in his paternal grandfather; Stomach cancer in his brother.    ROS:  Constitutional: Denied fever, chills, malaise, night sweats Eyes: Denied vision change or loss Ears/Nose/Mouth/Throat: Denied ear ache, sore throat, coughing, sinus pain Cardiovascular: see HPI  Respiratory: see HPI  Gastrointestinal: Denied nausea, vomiting, abdominal pain, diarrhea Genital/Urinary: Denied dysuria, hematuria, urinary frequency/urgency Musculoskeletal: Denied muscle ache, joint pain, weakness Skin: Denied rash, wound Neuro: Denied headache, dizziness, syncope Psych: history of depression/anxiety  Endocrine: history of diabetes    Physical Exam/Data:   Vitals:   06/02/22 1530 06/02/22 1535 06/02/22 1600 06/02/22 1630  BP: (!) 144/87  (!) 133/95 (!) 139/91  Pulse: 91 95 97 97  Resp: (!) '9 17 14 18  '$ Temp:      TempSrc:      SpO2: 98% 98% 97% 96%  Weight:      Height:        Intake/Output Summary (Last 24 hours) at 06/02/2022 1652 Last data filed at 06/02/2022 1457 Gross per 24 hour  Intake --  Output 500 ml  Net -500 ml      06/02/2022   11:37 AM 05/11/2022   10:08 AM 02/08/2022   10:27 AM  Last 3 Weights  Weight (lbs) 180 lb 192 lb 197 lb  Weight (kg) 81.647 kg 87.091 kg 89.359 kg     Body mass index is 27.37 kg/m.   Vitals:  Vitals:   06/02/22 1600 06/02/22 1630  BP: (!) 133/95 (!) 139/91  Pulse: 97 97  Resp: 14 18  Temp:    SpO2: 97% 96%   General Appearance: In no apparent distress, laying in bed HEENT: Normocephalic, atraumatic.  Neck: Supple, trachea midline, no JVDs Cardiovascular:  Regular rate and rhythm, normal S1-S2,  no murmur Respiratory: Resting breathing unlabored, lungs sounds clear to auscultation bilaterally, no use of accessory muscles. On room air.  No wheezes, rales or rhonchi.   Gastrointestinal: Bowel sounds positive, abdomen soft Extremities: Able to move all extremities in bed without difficulty,  no edema Musculoskeletal: Normal muscle bulk and tone Skin: Intact, warm, dry. No rashes or petechiae noted in exposed areas.  Neurologic: Alert, oriented to person, place and time. Fluent speech, no facial droop, no cognitive deficit Psychiatric:Anxious     EKG:  EKG showed sinus rhythm, old incomplete RBBB, no acute changes   Relevant CV Studies:  Echocardiogram from 04/09/2020: ( At [facility])  LVEF 55 to 65%, no regional wall motion abnormality, LV wall thickness normal, normal diastolic parameter, normal RV, normal left atrium and right atrium, no structural heart disease.    Stress Myoview 04/08/20:  No ischemia    Laboratory Data:  High Sensitivity Troponin:   Recent Labs  Lab 06/02/22 1154 06/02/22 1342  TROPONINIHS 3 4      Chemistry Recent Labs  Lab 06/02/22 1154  NA 137  K 4.1  CL 105  CO2 25  GLUCOSE 162*  BUN 15  CREATININE 0.78  CALCIUM 9.3  GFRNONAA >60  ANIONGAP 7    Recent Labs  Lab 06/02/22 1154  PROT 6.5  ALBUMIN 3.8  AST 26  ALT 21  ALKPHOS 85  BILITOT 1.0   Lipids No results for input(s): "CHOL", "TRIG", "HDL", "LABVLDL", "LDLCALC", "CHOLHDL" in the last 168 hours. Hematology Recent Labs  Lab 06/02/22 1154  WBC 6.8  RBC 4.90  HGB 13.3  HCT 39.7  MCV 81.0  MCH 27.1  MCHC 33.5  RDW 16.0*  PLT 272   Thyroid No results for input(s): "TSH", "FREET4" in the last 168 hours. BNPNo results for input(s): "BNP", "PROBNP" in the last 168 hours.  DDimer No results for input(s): "DDIMER" in the last 168 hours.   Radiology/Studies:  CT Angio Chest/Abd/Pel for Dissection W and/or Wo Contrast  Result Date: 06/02/2022 CLINICAL DATA:  History of thoracic aortic aneurysm. EXAM: CT ANGIOGRAPHY CHEST, ABDOMEN AND PELVIS TECHNIQUE: Non-contrast CT of the chest was initially obtained. Multidetector CT imaging through the chest, abdomen and pelvis was performed using the standard protocol during bolus administration of intravenous contrast.  Multiplanar reconstructed images and MIPs were obtained and reviewed to evaluate the vascular anatomy. RADIATION DOSE REDUCTION: This exam was performed according to the departmental dose-optimization program which includes automated exposure control, adjustment of the mA and/or kV according to patient size and/or use of iterative reconstruction technique. CONTRAST:  180m OMNIPAQUE IOHEXOL 350 MG/ML SOLN COMPARISON:  None Available. FINDINGS: CTA CHEST FINDINGS Cardiovascular: Preferential opacification of the thoracic aorta. No evidence of thoracic aortic dissection. Ascending thoracic aorta is normal in caliber. 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 Normal heart size. No pericardial effusion. Mediastinum/Nodes: No enlarged mediastinal, hilar, or axillary lymph nodes. Thyroid gland, trachea, and esophagus demonstrate no significant findings. Lungs/Pleura: Lungs are clear. No pleural effusion or pneumothorax. Musculoskeletal: No chest wall abnormality. No acute or significant osseous findings. Review of the MIP images confirms the above findings. CTA ABDOMEN AND PELVIS FINDINGS VASCULAR Aorta: Normal caliber aorta without aneurysm, dissection, vasculitis or significant stenosis. Celiac: Patent without evidence of aneurysm, dissection, vasculitis or significant stenosis. SMA: Patent without evidence of aneurysm, dissection, vasculitis or significant stenosis. Renals: Both renal arteries are patent without evidence of  aneurysm, dissection, vasculitis, fibromuscular dysplasia or significant stenosis. IMA: Patent without evidence of aneurysm, dissection, vasculitis or significant stenosis. Inflow: Patent without evidence of aneurysm, dissection, vasculitis or significant stenosis. Veins: No obvious venous abnormality within the limitations of this arterial phase study. Review of the MIP images confirms the above findings. NON-VASCULAR Lower chest: No acute abnormality.  Hepatobiliary: No focal liver abnormality is seen. No gallstones, gallbladder wall thickening, or biliary dilatation. Pancreas: Unremarkable. No pancreatic ductal dilatation or surrounding inflammatory changes. Spleen: Normal in size without focal abnormality. Adrenals/Urinary Tract: Adrenal glands are unremarkable. Kidneys are normal, without renal calculi, focal lesion, or hydronephrosis. Bladder is unremarkable. Stomach/Bowel: Stomach is within normal limits. No evidence of bowel wall thickening, distention, or inflammatory changes. Vascular/Lymphatic: No significant vascular findings are present. No enlarged abdominal or pelvic lymph nodes. Reproductive: Prostate is unremarkable. Other: No abdominal wall hernia or abnormality. No abdominopelvic ascites. Musculoskeletal: No acute osseous abnormality. No aggressive osseous lesion. Review of the MIP images confirms the above findings. IMPRESSION: 1. No evidence of thoracic or abdominal aortic dissection. 2. 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 Recommend annual imaging followup by CTA or MRA. This recommendation follows 2010 ACCF/AHA/AATS/ACR/ASA/SCA/SCAI/SIR/STS/SVM Guidelines for the Diagnosis and Management of Patients with Thoracic Aortic Disease. Circulation.2010; 121: Y774-J287. Aortic aneurysm NOS (ICD10-I71.9) Electronically Signed   By: Kathreen Devoid M.D.   On: 06/02/2022 13:34   DG Chest Portable 1 View  Result Date: 06/02/2022 CLINICAL DATA:  Chest pain. EXAM: PORTABLE CHEST 1 VIEW COMPARISON:  Jan 31, 2022 FINDINGS: Calcific atherosclerotic disease of the aorta. Cardiomediastinal silhouette is normal. Mediastinal contours appear intact. There is no evidence of focal airspace consolidation, pleural effusion or pneumothorax. Osseous structures are without acute abnormality. Soft tissues are grossly normal. IMPRESSION: 1. No active disease. 2. Calcific atherosclerotic disease of the aorta. Electronically  Signed   By: Fidela Salisbury M.D.   On: 06/02/2022 12:41     Assessment and Plan:   Chest pain - Hs trop 3 >4  - EKG no acute ischemic change - CTA torso negative for abnormal findings  - CT coronary study 2020 with calcium score 0 and normal coronary arteries - Echo from 7/ 2021 LVEF preserved, no RWMA, normal wall thickness, no valvular disease - Stress Myoveiw 03/2020 negative for ischemia - Pain is atypical, appears chronically intermittent over the years with multiple negative work-up, suspect anxiety/panic attack associated discomfort, patient states he has a lot stress factors currently and felt this is possible  - Recommend no further cardiac workup at this time, consider follow up with PCP for psychotherapy as well as anxiety medication adjustment  - follow up arranged with cardiology on 06/15/22   PVC/PAC/SVT Palpitation - noted on telemetry and 2 week monitor on 03/12/20 - will increase metoprolol from '50mg'$  BID to '75mg'$  BID, he is agreeable and willing to deal with fatigue  HTN - BP is normal during encounter, c/o elevated BP when he stands, increasing metoprolol, may trend BP at home  Risk Assessment/Risk Scores:   For questions or updates, please contact [place] Please consult [email or web address] for contact info under  Signed, Margie Billet, NP  06/02/2022 4:52 PM    Patient seen and examined, note reviewed with the signed Advanced Practice Provider. I personally reviewed laboratory data, imaging studies and relevant notes. I independently examined the patient and formulated the important aspects of the plan. I have personally discussed the plan with the patient and/or family. Comments  or changes to the note/plan are indicated below.  He is having intermittent palpitations.  His chest pain does sound atypical and is appears to be associated with increased heart rate.  I like to increase his beta-blocker to 75 mg twice daily.  He notes that recently he has taken some  of this dosage and it has helped him.  He will monitor to see if he gets fatigue is intolerable he will notify us.  His blood pressure slightly elevated but with the increase metoprolol that should help.  I do not suspect that he is experiencing ACS no need for any repeat ischemic evaluation at this time.  I also do believe that he has significant underlying untreated anxiety we talked about this.  With fever or nonspecific noncardiac treatment to the primary team.  Berniece Salines DO, MS [facility] [place] Attending Cardiologist [place]  [address] [place], [place] [address] [phone] Website: [email or web address]

## 2022-06-02 NOTE — ED Triage Notes (Signed)
Pt bib [facility] from [facility] where he had chest pain that started last night. Pt has taken '324mg'$  ASA and 1 nitro tab. Pt arrives diaphoretic and denying any pain.  EMS VSS

## 2022-06-02 NOTE — Discharge Instructions (Signed)
Increase your metoprolol to 75 mg twice daily.  Try taking the Carafate as well to help with your symptoms.  Follow-up with your primary care doctor to be rechecked and recheck your lipase level in the next week or so

## 2022-06-02 NOTE — ED Notes (Signed)
Updated patient on plan of care. Patient resting comfortably at this time. No complaints of chest pain. Call bell within reach and patient instructed to call if assistance is needed. Patient verbalized understanding of same.

## 2022-06-02 NOTE — ED Provider Notes (Signed)
[facility] Provider Note   CSN: [id number] Arrival date & time: 06/02/22  1128     History  Chief Complaint  Patient presents with   Chest Pain    Cody Raymond is a 51 y.o. male.  The history is provided by the patient and medical records. No language interpreter was used.  Chest Pain Pain location:  Substernal area, L chest and epigastric Pain quality: aching, crushing, pressure and sharp   Pain radiates to:  Epigastrium Pain severity:  Severe Onset quality:  Gradual Duration:  1 day Timing:  Constant Progression:  Resolved Chronicity:  New Associated symptoms: abdominal pain (upper), diaphoresis, nausea and shortness of breath   Associated symptoms: no altered mental status, no anxiety, no back pain, no claudication, no cough, no dizziness, no fatigue, no fever, no headache, no lower extremity edema, no numbness, no palpitations, no syncope, no vomiting and no weakness   Risk factors: aortic disease, coronary artery disease, diabetes mellitus, high cholesterol, hypertension and male sex   Risk factors: no prior DVT/PE        Home Medications Prior to Admission medications   Medication Sig Start Date End Date Taking? Authorizing Provider  Ascorbic Acid (VITAMIN C) 1000 MG tablet Take 1,000 mg by mouth daily.    [provider]  aspirin EC 81 MG EC tablet Take 1 tablet (81 mg total) by mouth daily. 12/16/18   Dessa Phi, DO  atorvastatin (LIPITOR) 80 MG tablet Take 1 tablet (80 mg total) by mouth daily. 05/30/22 08/28/22  [name], NP  betamethasone dipropionate (DIPROLENE) 0.05 % cream Apply topically 2 (two) times daily. 05/31/19   [name], NP  cetirizine (ZYRTEC) 10 MG tablet Take 10 mg by mouth daily.    [provider]  clonazePAM (KLONOPIN) 1 MG tablet TAKE 1/2 TABLET EVERY MORNING  AND TAKE 1 TABLET EVERY EVENING 05/26/22   [name], NP  DULoxetine (CYMBALTA) 60 MG  capsule TAKE 1 CAPSULE EVERY DAY 05/31/22   [name], NP  ezetimibe (ZETIA) 10 MG tablet TAKE 1 TABLET EVERY DAY 05/09/22   [name], NP  fluticasone (FLONASE) 50 MCG/ACT nasal spray USE 2 SPRAYS IN EACH NOSTRIL ONE TIME DAILY 01/19/22   [name], NP  fluticasone-salmeterol (WIXELA INHUB) 250-50 MCG/ACT AEPB Inhale 1 puff into the lungs in the morning and at bedtime. 11/16/21   Freddi Starr, MD  gabapentin (NEURONTIN) 300 MG capsule TAKE 1 CAPSULE AT 6AM, 11:30AM, 4:30PM. TAKE 3 CAPSULES AT 10PM 10/01/21   [name], NP  Icosapent Ethyl (VASCEPA) 1 g CAPS Take 2 capsules (2 g total) by mouth 2 (two) times daily. 12/19/18   Hilty, Nadean Corwin, MD  ipratropium (ATROVENT) 0.03 % nasal spray Place 2 sprays into both nostrils every 12 (twelve) hours. 10/14/21   Freddi Starr, MD  ipratropium-albuterol (DUONEB) 0.5-2.5 (3) MG/3ML SOLN INHALE 1 VIAL VIA NEBULIZATION EVERY 6 HOURS AS NEEDED 10/01/21   [name], NP  isosorbide mononitrate (IMDUR) 30 MG 24 hr tablet TAKE 1/2 TABLET BY MOUTH DAILY 01/21/20   Sande Rives E, PA-C  ketoconazole (NIZORAL) 2 % shampoo Apply topically 2 (two) times a week. 11/12/20   [name], NP  lisinopril (ZESTRIL) 10 MG tablet Take 0.5 tablets (5 mg total) by mouth daily. 05/30/22   [name], NP  meloxicam (MOBIC) 7.5 MG tablet TAKE 1 TABLET DAILY AS NEEDED FOR PAIN 05/09/22   [name], NP  metFORMIN (GLUCOPHAGE-XR) 500 MG 24 hr  tablet Take 2 tablets (1,000 mg total) by mouth daily with breakfast. 09/29/21   [name], NP  metoprolol tartrate (LOPRESSOR) 50 MG tablet TAKE 1 TABLET TWICE DAILY 05/08/22   [name], NP  montelukast (SINGULAIR) 10 MG tablet TAKE 1 TABLET AT BEDTIME 05/09/22   [name], NP  Multiple Vitamin (MULTIVITAMIN WITH MINERALS) TABS tablet Take 1 tablet by mouth daily.    [provider]  nitroGLYCERIN (NITROSTAT) 0.4 MG SL tablet Place 1  tablet (0.4 mg total) under the tongue every 5 (five) minutes as needed for chest pain. 01/21/20   Sande Rives E, PA-C  ONETOUCH VERIO test strip CHECK BLOOD SUGAR 3 TIMES DAILY 10/01/21   [name], NP  pantoprazole (PROTONIX) 40 MG tablet TAKE 1 TABLET (40 MG TOTAL) BY MOUTH DAILY. 02/24/22   [name], NP  promethazine-dextromethorphan (PROMETHAZINE-DM) 6.25-15 MG/5ML syrup Take 5 mLs by mouth 4 (four) times daily as needed. 01/31/22   Ann Held, DO  silver sulfADIAZINE (SILVADENE) 1 % cream Apply 1 application topically daily. 08/21/19   [name], NP  tamsulosin (FLOMAX) 0.4 MG CAPS capsule TAKE 1 CAPSULE EVERY DAY 05/08/22   [name], NP  tiZANidine (ZANAFLEX) 2 MG tablet TAKE 1 TABLET EVERY 6 HOURS AS NEEDED FOR MUSCLE SPASM(S) 05/09/22   Bayard Hugger, NP  traMADol (ULTRAM) 50 MG tablet TAKE 1 TABLET TWICE DAILY 04/28/22   Kirsteins, Luanna Salk, MD  traZODone (DESYREL) 100 MG tablet TAKE 1 TABLET AT BEDTIME 10/01/21   [name], NP  zolpidem (AMBIEN) 10 MG tablet Take 1 tablet (10 mg total) by mouth at bedtime as needed. for sleep 05/30/22   [name], NP  Zoster Vaccine Adjuvanted [facility]) injection Inject 0.'5mg'$  IM now and repeat in 2-6 months. 03/31/21   [name], NP      Allergies    Benadryl [diphenhydramine hcl]    Review of Systems   Review of Systems  Constitutional:  Positive for diaphoresis. Negative for chills, fatigue and fever.  HENT:  Negative for congestion.   Respiratory:  Positive for shortness of breath. Negative for cough, chest tightness and wheezing.   Cardiovascular:  Positive for chest pain. Negative for palpitations, claudication, leg swelling and syncope.  Gastrointestinal:  Positive for abdominal pain (upper), diarrhea and nausea. Negative for constipation and vomiting.  Genitourinary:  Negative for dysuria and flank pain.  Musculoskeletal:  Negative for back pain, neck pain and  neck stiffness.  Skin:  Negative for rash.  Neurological:  Positive for light-headedness. Negative for dizziness, syncope, weakness, numbness and headaches.  Psychiatric/Behavioral:  Negative for agitation and confusion.   All other systems reviewed and are negative.   Physical Exam Updated Vital Signs Pulse 73   Temp 97.9 F (36.6 C) (Oral)   Resp 20   Ht '5\' 8"'$  (1.727 m)   Wt 81.6 kg   SpO2 100%   BMI 27.37 kg/m  Physical Exam Vitals and nursing note reviewed.  Constitutional:      General: He is not in acute distress.    Appearance: He is well-developed. He is not ill-appearing, toxic-appearing or diaphoretic.  HENT:     Head: Normocephalic and atraumatic.  Eyes:     Conjunctiva/sclera: Conjunctivae normal.  Cardiovascular:     Rate and Rhythm: Normal rate and regular rhythm.     Heart sounds: Normal heart sounds. No murmur heard. Pulmonary:     Effort: Pulmonary effort is normal. No respiratory distress.     Breath  sounds: Normal breath sounds. No decreased breath sounds, wheezing, rhonchi or rales.  Chest:     Chest wall: No tenderness.  Abdominal:     Palpations: Abdomen is soft.     Tenderness: There is no abdominal tenderness.  Musculoskeletal:        General: No swelling.     Cervical back: Neck supple.     Right lower leg: No tenderness. No edema.     Left lower leg: No tenderness. No edema.  Skin:    General: Skin is warm and dry.     Capillary Refill: Capillary refill takes less than 2 seconds.     Findings: No erythema.  Neurological:     Mental Status: He is alert.  Psychiatric:        Mood and Affect: Mood normal.     ED Results / Procedures / Treatments   Labs (all labs ordered are listed, but only abnormal results are displayed) Labs Reviewed  CBC WITH DIFFERENTIAL/PLATELET - Abnormal; Notable for the following components:      Result Value   RDW 16.0 (*)    All other components within normal limits  COMPREHENSIVE METABOLIC PANEL -  Abnormal; Notable for the following components:   Glucose, Bld 162 (*)    All other components within normal limits  LIPASE, BLOOD - Abnormal; Notable for the following components:   Lipase 109 (*)    All other components within normal limits  LACTIC ACID, PLASMA  TROPONIN I (HIGH SENSITIVITY)  TROPONIN I (HIGH SENSITIVITY)    EKG EKG Interpretation  Date/Time:  Thursday June 02 2022 11:36:36 EDT Ventricular Rate:  75 PR Interval:  139 QRS Duration: 106 QT Interval:  366 QTC Calculation: 409 R Axis:   -30 Text Interpretation: Sinus rhythm Ventricular premature complex S1,S2,S3 pattern RSR' in V1 or V2, right VCD or RVH Borderline ST elevation, anterior leads when compared to prior, similar appearance. No STEMI Confirmed by Antony Blackbird [phone]) on 06/02/2022 11:40:38 AM  Radiology CT Angio Chest/Abd/Pel for Dissection W and/or Wo Contrast  Result Date: 06/02/2022 CLINICAL DATA:  History of thoracic aortic aneurysm. EXAM: CT ANGIOGRAPHY CHEST, ABDOMEN AND PELVIS TECHNIQUE: Non-contrast CT of the chest was initially obtained. Multidetector CT imaging through the chest, abdomen and pelvis was performed using the standard protocol during bolus administration of intravenous contrast. Multiplanar reconstructed images and MIPs were obtained and reviewed to evaluate the vascular anatomy. RADIATION DOSE REDUCTION: This exam was performed according to the departmental dose-optimization program which includes automated exposure control, adjustment of the mA and/or kV according to patient size and/or use of iterative reconstruction technique. CONTRAST:  11m OMNIPAQUE IOHEXOL 350 MG/ML SOLN COMPARISON:  None Available. FINDINGS: CTA CHEST FINDINGS Cardiovascular: Preferential opacification of the thoracic aorta. No evidence of thoracic aortic dissection. Ascending thoracic aorta is normal in caliber. 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 Normal heart  size. No pericardial effusion. Mediastinum/Nodes: No enlarged mediastinal, hilar, or axillary lymph nodes. Thyroid gland, trachea, and esophagus demonstrate no significant findings. Lungs/Pleura: Lungs are clear. No pleural effusion or pneumothorax. Musculoskeletal: No chest wall abnormality. No acute or significant osseous findings. Review of the MIP images confirms the above findings. CTA ABDOMEN AND PELVIS FINDINGS VASCULAR Aorta: Normal caliber aorta without aneurysm, dissection, vasculitis or significant stenosis. Celiac: Patent without evidence of aneurysm, dissection, vasculitis or significant stenosis. SMA: Patent without evidence of aneurysm, dissection, vasculitis or significant stenosis. Renals: Both renal arteries are patent without evidence  of aneurysm, dissection, vasculitis, fibromuscular dysplasia or significant stenosis. IMA: Patent without evidence of aneurysm, dissection, vasculitis or significant stenosis. Inflow: Patent without evidence of aneurysm, dissection, vasculitis or significant stenosis. Veins: No obvious venous abnormality within the limitations of this arterial phase study. Review of the MIP images confirms the above findings. NON-VASCULAR Lower chest: No acute abnormality. Hepatobiliary: No focal liver abnormality is seen. No gallstones, gallbladder wall thickening, or biliary dilatation. Pancreas: Unremarkable. No pancreatic ductal dilatation or surrounding inflammatory changes. Spleen: Normal in size without focal abnormality. Adrenals/Urinary Tract: Adrenal glands are unremarkable. Kidneys are normal, without renal calculi, focal lesion, or hydronephrosis. Bladder is unremarkable. Stomach/Bowel: Stomach is within normal limits. No evidence of bowel wall thickening, distention, or inflammatory changes. Vascular/Lymphatic: No significant vascular findings are present. No enlarged abdominal or pelvic lymph nodes. Reproductive: Prostate is unremarkable. Other: No abdominal wall hernia  or abnormality. No abdominopelvic ascites. Musculoskeletal: No acute osseous abnormality. No aggressive osseous lesion. Review of the MIP images confirms the above findings. IMPRESSION: 1. No evidence of thoracic or abdominal aortic dissection. 2. 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 Recommend annual imaging followup by CTA or MRA. This recommendation follows 2010 ACCF/AHA/AATS/ACR/ASA/SCA/SCAI/SIR/STS/SVM Guidelines for the Diagnosis and Management of Patients with Thoracic Aortic Disease. Circulation.2010; 121: K562-B638. Aortic aneurysm NOS (ICD10-I71.9) Electronically Signed   By: Kathreen Devoid M.D.   On: 06/02/2022 13:34   DG Chest Portable 1 View  Result Date: 06/02/2022 CLINICAL DATA:  Chest pain. EXAM: PORTABLE CHEST 1 VIEW COMPARISON:  Jan 31, 2022 FINDINGS: Calcific atherosclerotic disease of the aorta. Cardiomediastinal silhouette is normal. Mediastinal contours appear intact. There is no evidence of focal airspace consolidation, pleural effusion or pneumothorax. Osseous structures are without acute abnormality. Soft tissues are grossly normal. IMPRESSION: 1. No active disease. 2. Calcific atherosclerotic disease of the aorta. Electronically Signed   By: Fidela Salisbury M.D.   On: 06/02/2022 12:41    Procedures Procedures    Medications Ordered in ED Medications  iohexol (OMNIPAQUE) 350 MG/ML injection 100 mL (100 mLs Intravenous Contrast Given 06/02/22 1320)    ED Course/ Medical Decision Making/ A&P                           Medical Decision Making Amount and/or Complexity of Data Reviewed Labs: ordered. Radiology: ordered.  Risk Prescription drug management.     Cody Raymond is a 51 y.o. male with a past medical history significant for CAD, diabetes, asthma, hypertension, hyperlipidemia, thoracic aneurysm, previous PDA repair, and previous back surgery who presents with chest pain.  According to patient, last night he had  onset of a pressure in his chest and nausea and lightheadedness.  He reports that today it persisted and he was extremely diaphoretic.  He reports he took aspirin and nitro and the pain has resolved.  He reports it did not go to his back but did go his epigastric area.  He is somewhat concerned about his aneurysm that is known.  He denies any other fevers, chills, congestion, cough, constipation or urinary symptoms but does report some recent diarrhea.  Denies any leg pain or leg swelling and denies any trauma.  EMS reports he was extremely diaphoretic when they arrived but after nitro and transported symptoms improved.  EMS reports that they sent the EKG across the radio and decision was made to have the patient come to the emergency department instead of activation of  a STEMI.  EMS reports that some of the ST changes they saw had improved during transport.  On arrival, airway is intact.  Breath sounds equal bilaterally.  Chest was not specifically tender.  I do not appreciate a murmur.  Patient has good pulses in all extremities.  No rash seen.  Back is nontender.  Patient otherwise well-appearing now.  Vital signs reassuring with normal blood pressure 125/80 on my first eval.  He is not hypoxic or tachycardic.  EKG does not show STEMI.  Clinically I am somewhat concerned about the patient given the reported severe diaphoresis with the crushing chest pain, nausea, shortness of breath, and lightheadedness that responded to nitro.  We will get screening labs and will get chest x-ray.  We will trend troponins but will also get a dissection study given his history of thoracic aneurysm and the pain going into his abdomen.  Anticipate discussion with cardiology after work-up is completed.  Anticipate admission.  2:06 PM CT scan showed no dissection and the area of aortic dilation is 3.4 cm relatively similar to prior.  Lipase slightly more elevated than prior but otherwise labs reassuring.  Initial troponin  negative.  Will trend.  Due to his chest pain, diaphoresis, and improvement with nitroglycerin, will speak to cardiology, anticipate admission for further management.  3:21 PM Just spoke with cardiology who will come see the patient and help determine if they need to admit or medicine to meet with them following.  Will await their recommendations.  Patient is still chest pain-free and resting.         Final Clinical Impression(s) / ED Diagnoses Final diagnoses:  Chest pain, unspecified type    Clinical Impression: 1. Chest pain, unspecified type     Disposition: Admit  This note was prepared with assistance of Dragon voice recognition software. Occasional wrong-word or sound-a-like substitutions may have occurred due to the inherent limitations of voice recognition software.      Obrian Bulson, Gwenyth Allegra, MD 06/02/22 [id number]

## 2022-06-02 NOTE — ED Provider Notes (Signed)
Patient is seen and seen by Dr. Albertina Senegal.  Please see his note.  Patient was seen by cardiology service.  They recommend increasing metoprolol to 75 mg twice daily.  Otherwise appropriate for discharge.  I did discuss with the results with the patient.  Elevated lipase noted however no signs of acute findings on the CT scan.  We will have the patient start Carafate and follow-up with his primary doctor next week to have the lipase rechecked   Dorie Rank, MD 06/02/22 1706

## 2022-06-11 ENCOUNTER — Other Ambulatory Visit: Payer: Self-pay | Admitting: Family

## 2022-06-13 NOTE — Progress Notes (Deleted)
Cardiology Clinic Note   Patient Name: Cody Raymond Date of Encounter: 06/13/2022  Primary Care Provider:  [name], NP Primary Cardiologist:  Quay Burow, MD  Patient Profile    Cody Raymond 51 year old male presents the clinic today for follow-up evaluation of his coronary artery disease and essential hypertension.  Past Medical History    Past Medical History:  Diagnosis Date   Aneurysm ([place])    Asthma    Blood transfusion    CAD (coronary artery disease), non obstructive on cath 2011 04/05/2012   Coronary artery disease    Diabetes mellitus    Diabetes mellitus without complication ([place])    Enlarged prostate    GERD (gastroesophageal reflux disease)    H/O syncope    Heart disease    History of repair of patent ductus arteriosus 07/23/2016   Hyperlipidemia    Hypertension    Neuromuscular disorder ([place])    DJD   Refusal of blood transfusions as patient is Jehovah's Witness    Past Surgical History:  Procedure Laterality Date   APPENDECTOMY     BACK SURGERY     CARDIAC CATHETERIZATION  2007   Clean Cardiac Cath ([place]), with RCA 30% narrowing likely due to catheter induced spasm in 09/02/10.   CARDIAC CATHETERIZATION  09/02/2010   mod. nonobstructive disease in the RCA and CX, tortuous LAD   COLONOSCOPY W/ POLYPECTOMY  03/17/11   diminutive polyp   LOOP RECORDER EXPLANT N/A 01/14/2014   Procedure: LOOP RECORDER EXPLANT;  Surgeon: Sanda Klein, MD;  Location: [facility];  Service: Cardiovascular;  Laterality: N/A;   LOOP RECORDER IMPLANT  04/06/2012   Reveal XT 4529   LOOP RECORDER IMPLANT N/A 04/06/2012   Procedure: LOOP RECORDER IMPLANT;  Surgeon: Sanda Klein, MD;  Location: [facility];  Service: Cardiovascular;  Laterality: N/A;   NM MYOCAR PERF WALL MOTION  01/25/2008   mild anteroapical wall ischemia   PATENT DUCTUS ARTERIOUS REPAIR     at age 19    Allergies  Allergies  Allergen Reactions   Benadryl  [Diphenhydramine Hcl]     "drives me nuts"    History of Present Illness    Cody Raymond has a PMH of coronary artery disease, RBBB, essential hypertension, thoracic aortic aneurysm, frequent PVCs, asthma, bronchitis with bronchospasm, GERD, type 2 diabetes, chronic lumbar radiculopathy, cervical stenosis, obesity, palpitations, and anxiety.  His PMH also includes cardiac catheterization and 2011 which showed nonobstructive CAD in his left circumflex and RCA.  Nuclear stress test in 2016 was negative.  He is a Restaurant manager, fast food.  He was seen in follow-up by Almyra Deforest PA-C on 12/22/2021.  He reported occasional skipped heartbeats.  It was felt that his symptoms were consistent with PACs and PVCs.  He also had a recording on his personal cardiac monitor which showed PACs and PVCs.  He reported that he had not had any recent caffeinated beverages.  His TSH in 2011 was normal.  His blood work was drawn with his PCP.  He was previously unable to tolerate higher dose of metoprolol due to fatigue.  He denied chest pain.  His chronic dyspnea on exertion was related to asthma.  He was noted to have expiratory wheezes on exam.  It was felt that he should be following with pulmonology.  He was instructed that he may take an extra half dose of metoprolol for increased palpitations.  He presented to the emergency department on 914 23.  He was diaphoretic,  short of breath, reported chest pain, abdominal pain, diarrhea, nausea, and lightheadedness.  His lipase was noted to be elevated however, his CT showed no acute findings.  He was started on Carafate.  His EKG showed sinus rhythm with PVCs 75 bpm.  His initial EKG showed STEMI however, during transport his ST changes improved.  His blood pressure was 125/80.  His EKG was reviewed and did not show STEMI.  He was noted to have aortic dilation of 3.4 cm which was similar to prior.  His cardiac troponins are negative.  His chest pain resolved.  Cardiology was consulted  and recommended increasing his metoprolol to 75 mg twice daily.  He presents to the clinic today for follow-up evaluation and states***  *** denies chest pain, shortness of breath, lower extremity edema, fatigue, palpitations, melena, hematuria, hemoptysis, diaphoresis, weakness, presyncope, syncope, orthopnea, and PND.  Chest discomfort-denies further episodes of chest discomfort.  No arm neck or back discomfort.  Presented to the emergency department on 06/02/2022 with chest discomfort, elevated lipase.  CT showed no acute findings.  His metoprolol was increased to 75 mg twice daily.  Cardiac troponins were negative.  Blood pressure well controlled.  EKG showed sinus rhythm with PVCs. Continue metoprolol Heart healthy low-sodium diet-salty 6 given Increase physical activity as tolerated  Coronary artery disease-no chest pain today.  Catheterization in 2011 showed nonobstructive CAD.  Nuclear stress test 2016 was negative. Continue aspirin, atorvastatin, ezetimibe, Vascepa, Imdur, lisinopril, metoprolol Heart healthy low-sodium diet-salty 6 given Increase physical activity as tolerated  Hyperlipidemia-LDL*** Continue aspirin, atorvastatin, ezetimibe, Vascepa Heart healthy low-sodium high-fiber diet Increase physical activity as tolerated  Essential hypertension-BP today*** Continue metoprolol, lisinopril, Imdur Heart healthy low-sodium diet-salty 6 given Increase physical activity as tolerated  Disposition: Follow-up with Dr.Berry in 3-4 months.  Home Medications    Prior to Admission medications   Medication Sig Start Date End Date Taking? Authorizing Provider  Ascorbic Acid (VITAMIN C) 1000 MG tablet Take 1,000 mg by mouth daily.    [provider]  aspirin EC 81 MG EC tablet Take 1 tablet (81 mg total) by mouth daily. 12/16/18   Dessa Phi, DO  atorvastatin (LIPITOR) 80 MG tablet Take 1 tablet (80 mg total) by mouth daily. 05/30/22 08/28/22  [name], NP   betamethasone dipropionate (DIPROLENE) 0.05 % cream Apply topically 2 (two) times daily. 05/31/19   [name], NP  cetirizine (ZYRTEC) 10 MG tablet Take 10 mg by mouth daily.    [provider]  clonazePAM (KLONOPIN) 1 MG tablet TAKE 1/2 TABLET EVERY MORNING  AND TAKE 1 TABLET EVERY EVENING 05/26/22   [name], NP  DULoxetine (CYMBALTA) 60 MG capsule TAKE 1 CAPSULE EVERY DAY 05/31/22   [name], NP  ezetimibe (ZETIA) 10 MG tablet TAKE 1 TABLET EVERY DAY 05/09/22   [name], NP  fluticasone (FLONASE) 50 MCG/ACT nasal spray USE 2 SPRAYS IN EACH NOSTRIL ONE TIME DAILY 06/13/22   [name], NP  fluticasone-salmeterol (WIXELA INHUB) 250-50 MCG/ACT AEPB Inhale 1 puff into the lungs in the morning and at bedtime. 11/16/21   Freddi Starr, MD  gabapentin (NEURONTIN) 300 MG capsule TAKE 1 CAPSULE AT 6AM, 11:30AM, 4:30PM. TAKE 3 CAPSULES AT 10PM 10/01/21   [name], NP  Icosapent Ethyl (VASCEPA) 1 g CAPS Take 2 capsules (2 g total) by mouth 2 (two) times daily. 12/19/18   Hilty, Nadean Corwin, MD  ipratropium (ATROVENT) 0.03 % nasal spray Place 2 sprays into both nostrils every 12 (  twelve) hours. 10/14/21   Freddi Starr, MD  ipratropium-albuterol (DUONEB) 0.5-2.5 (3) MG/3ML SOLN INHALE 1 VIAL VIA NEBULIZATION EVERY 6 HOURS AS NEEDED 10/01/21   [name], NP  isosorbide mononitrate (IMDUR) 30 MG 24 hr tablet TAKE 1/2 TABLET BY MOUTH DAILY 01/21/20   Sande Rives E, PA-C  ketoconazole (NIZORAL) 2 % shampoo Apply topically 2 (two) times a week. 11/12/20   [name], NP  lisinopril (ZESTRIL) 10 MG tablet Take 0.5 tablets (5 mg total) by mouth daily. 05/30/22   [name], NP  meloxicam (MOBIC) 7.5 MG tablet TAKE 1 TABLET DAILY AS NEEDED FOR PAIN 05/09/22   [name], NP  metFORMIN (GLUCOPHAGE-XR) 500 MG 24 hr tablet Take 2 tablets (1,000 mg total) by mouth daily with breakfast. 09/29/21   [name],  NP  metoprolol tartrate 75 MG TABS Take 50 mg by mouth 2 (two) times daily. 06/02/22   Dorie Rank, MD  montelukast (SINGULAIR) 10 MG tablet TAKE 1 TABLET AT BEDTIME 05/09/22   [name], NP  Multiple Vitamin (MULTIVITAMIN WITH MINERALS) TABS tablet Take 1 tablet by mouth daily.    [provider]  nitroGLYCERIN (NITROSTAT) 0.4 MG SL tablet Place 1 tablet (0.4 mg total) under the tongue every 5 (five) minutes as needed for chest pain. 01/21/20   Sande Rives E, PA-C  ONETOUCH VERIO test strip CHECK BLOOD SUGAR 3 TIMES DAILY 10/01/21   [name], NP  pantoprazole (PROTONIX) 40 MG tablet TAKE 1 TABLET (40 MG TOTAL) BY MOUTH DAILY. 02/24/22   [name], NP  promethazine-dextromethorphan (PROMETHAZINE-DM) 6.25-15 MG/5ML syrup Take 5 mLs by mouth 4 (four) times daily as needed. 01/31/22   Ann Held, DO  silver sulfADIAZINE (SILVADENE) 1 % cream Apply 1 application topically daily. 08/21/19   [name], NP  sucralfate (CARAFATE) 1 g tablet Take 1 tablet (1 g total) by mouth in the morning, at noon, and at bedtime. 06/02/22   Dorie Rank, MD  tamsulosin (FLOMAX) 0.4 MG CAPS capsule TAKE 1 CAPSULE EVERY DAY 05/08/22   [name], NP  tiZANidine (ZANAFLEX) 2 MG tablet TAKE 1 TABLET EVERY 6 HOURS AS NEEDED FOR MUSCLE SPASM(S) 05/09/22   Bayard Hugger, NP  traMADol (ULTRAM) 50 MG tablet TAKE 1 TABLET TWICE DAILY 04/28/22   Kirsteins, Luanna Salk, MD  traZODone (DESYREL) 100 MG tablet TAKE 1 TABLET AT BEDTIME 10/01/21   [name], NP  zolpidem (AMBIEN) 10 MG tablet Take 1 tablet (10 mg total) by mouth at bedtime as needed. for sleep 05/30/22   [name], NP  Zoster Vaccine Adjuvanted [facility]) injection Inject 0.'5mg'$  IM now and repeat in 2-6 months. 03/31/21   [name], NP    Family History    Family History  Problem Relation Age of Onset   Colon cancer Paternal Grandfather    Prostate cancer Paternal Grandfather     Aneurysm Father    Heart attack Father 61   Coronary artery disease Father    Colon polyps Mother    Heart disease Mother    Coronary artery disease Mother    Migraines Mother    Breast cancer Maternal Grandmother    Colon cancer Paternal Uncle        x 2   Stomach cancer Brother    Liver disease Other        unsure who it was   Allergies Daughter    He indicated that his mother is alive. He indicated that his father is  deceased. He indicated that the status of his brother is unknown. He indicated that his maternal grandmother is deceased. He indicated that his maternal grandfather is deceased. He indicated that his paternal grandmother is deceased. He indicated that his paternal grandfather is deceased. He indicated that the status of his daughter is unknown. He indicated that the status of his paternal uncle is unknown. He indicated that the status of his other is unknown.  Social History    Social History   Socioeconomic History   Marital status: Married    Spouse name: Not on file   Number of children: Not on file   Years of education: Not on file   Highest education level: Not on file  Occupational History   Not on file  Tobacco Use   Smoking status: Never   Smokeless tobacco: Never  Vaping Use   Vaping Use: Never used  Substance and Sexual Activity   Alcohol use: No   Drug use: No   Sexual activity: Yes  Other Topics Concern   Not on file  Social History Narrative   ** Merged History Encounter **       Holter monitor 08/2010: PVCs and sinus tachy.   Sleep Study (02/2008): mild sleep apnea, no indication for CPAP.   Social Determinants of Health   Financial Resource Strain: Medium Risk (12/06/2021)   Overall Financial Resource Strain (CARDIA)    Difficulty of Paying Living Expenses: Somewhat hard  Food Insecurity: Food Insecurity Present (12/06/2021)   Hunger Vital Sign    Worried About Running Out of Food in the Last Year: Sometimes true    Ran Out of Food in  the Last Year: Sometimes true  Transportation Needs: No Transportation Needs (12/06/2021)   PRAPARE - Hydrologist (Medical): No    Lack of Transportation (Non-Medical): No  Physical Activity: Insufficiently Active (12/06/2021)   Exercise Vital Sign    Days of Exercise per Week: 3 days    Minutes of Exercise per Session: 20 min  Stress: Stress Concern Present (12/06/2021)   [place]    Feeling of Stress : To some extent  Social Connections: Moderately Integrated (12/06/2021)   Social Connection and Isolation Panel [NHANES]    Frequency of Communication with Friends and Family: More than three times a week    Frequency of Social Gatherings with Friends and Family: More than three times a week    Attends Religious Services: More than 4 times per year    Active Member of [employer] or Organizations: No    Attends Archivist Meetings: Never    Marital Status: Married  Human resources officer Violence: Not At Risk (12/06/2021)   Humiliation, Afraid, Rape, and Kick questionnaire    Fear of Current or Ex-Partner: No    Emotionally Abused: No    Physically Abused: No    Sexually Abused: No     Review of Systems    General:  No chills, fever, night sweats or weight changes.  Cardiovascular:  No chest pain, dyspnea on exertion, edema, orthopnea, palpitations, paroxysmal nocturnal dyspnea. Dermatological: No rash, lesions/masses Respiratory: No cough, dyspnea Urologic: No hematuria, dysuria Abdominal:   No nausea, vomiting, diarrhea, bright red blood per rectum, melena, or hematemesis Neurologic:  No visual changes, wkns, changes in mental status. All other systems reviewed and are otherwise negative except as noted above.  Physical Exam    VS:  There were no vitals taken  for this visit. , BMI There is no height or weight on file to calculate BMI. GEN: Well nourished, well developed, in no acute  distress. HEENT: normal. Neck: Supple, no JVD, carotid bruits, or masses. Cardiac: RRR, no murmurs, rubs, or gallops. No clubbing, cyanosis, edema.  Radials/DP/PT 2+ and equal bilaterally.  Respiratory:  Respirations regular and unlabored, clear to auscultation bilaterally. GI: Soft, nontender, nondistended, BS + x 4. MS: no deformity or atrophy. Skin: warm and dry, no rash. Neuro:  Strength and sensation are intact. Psych: Normal affect.  Accessory Clinical Findings    Recent Labs: 06/02/2022: ALT 21; BUN 15; Creatinine, Ser 0.78; Hemoglobin 13.3; Platelets 272; Potassium 4.1; Sodium 137   Recent Lipid Panel    Component Value Date/Time   CHOL 97 12/28/2021 1021   CHOL 82 (L) 04/19/2019 0832   TRIG 113.0 12/28/2021 1021   HDL 40.90 12/28/2021 1021   HDL 31 (L) 04/19/2019 0832   CHOLHDL 2 12/28/2021 1021   VLDL 22.6 12/28/2021 1021   LDLCALC 33 12/28/2021 1021   LDLCALC 32 04/19/2019 0832   LDLDIRECT 36 04/19/2019 0832   LDLDIRECT 119.0 07/31/2018 1044    No BP recorded.  {Refresh Note OR Click here to enter BP  :1}***    ECG personally reviewed by me today- *** - No acute changes  [facility]ch[facility]  EF 55-65% no regional wall motion abnormalities, left ventricular diastolic parameters, no significant valvular abnormalities    Assessment & Plan   1.  ***   Jossie Ng. Faryal Marxen NP-C     06/13/2022, 1:09 PM [address] [place] [address] Office [phone] Fax [phone]  Notice: This dictation was prepared with Dragon dictation along with smaller phrase technology. Any transcriptional errors that result from this process are unintentional and may not be corrected upon review.  I spent***minutes examining this patient, reviewing medications, and using patient centered shared decision making involving her cardiac care.  Prior to her visit I spent greater than 20 minutes reviewing her  past medical history,  medications, and prior cardiac tests.

## 2022-06-15 ENCOUNTER — Ambulatory Visit: Payer: Medicare HMO | Attending: General Practice | Admitting: General Practice

## 2022-06-17 ENCOUNTER — Telehealth: Payer: Self-pay

## 2022-06-17 NOTE — Telephone Encounter (Signed)
        Patient  visited [facility] on 06/02/2022  for Chest pain, unspecified.   Telephone encounter attempt :  1st  A HIPAA compliant voice message was left requesting a return call.  Instructed patient to call back at [phone].   [place] Resource Care Guide   [email or web address]  ?? [phone]   Website: [email or web address]  [email or web address]  "We don't say no, we SHOW how!"         [facility]

## 2022-06-21 ENCOUNTER — Telehealth: Payer: Self-pay

## 2022-06-21 NOTE — Telephone Encounter (Signed)
        Patient  visited [facility] on 06/02/2022  for Chest pain, unspecified.   Telephone encounter attempt :  2nd  A HIPAA compliant voice message was left requesting a return call.  Instructed patient to call back at [phone].   [place] Resource Care Guide   ??[email or web address]  ?? [phone]   Website: [email or web address]

## 2022-06-22 ENCOUNTER — Telehealth: Payer: Self-pay

## 2022-06-22 NOTE — Telephone Encounter (Signed)
     Patient  visit on 06/02/2022  at [facility] was for Chest pain, unspecified.  Have you been able to follow up with your primary care physician? Yes. Patient stated he has seen his primary care physician and plans to schedule an appointment with his Cardiologist.  The patient was or was not able to obtain any needed medicine or equipment. Patient was able to obtain prescribed meds, they have eliminated the PVC's he has been experiencing.   Are there diet recommendations that you are having difficulty following? Patient has  eliminated caffeinated drinks from his diet.  Patient expresses understanding of discharge instructions and education provided has no other needs at this time.    [place] Resource Care Guide   ??[email or web address]  ?? [phone]   Website: [email or web address]  [email or web address]

## 2022-06-26 ENCOUNTER — Other Ambulatory Visit: Payer: Self-pay | Admitting: Family

## 2022-06-30 ENCOUNTER — Encounter: Payer: Medicare HMO | Attending: Registered Nurse | Admitting: Registered Nurse

## 2022-07-04 ENCOUNTER — Other Ambulatory Visit: Payer: Self-pay

## 2022-07-04 ENCOUNTER — Telehealth: Payer: Self-pay | Admitting: *Deleted

## 2022-07-04 DIAGNOSIS — E1142 Type 2 diabetes mellitus with diabetic polyneuropathy: Secondary | ICD-10-CM

## 2022-07-04 MED ORDER — BLOOD GLUCOSE METER KIT: PACK | 0 refills | Status: DC

## 2022-07-04 NOTE — Telephone Encounter (Signed)
Rx sent for Accu-Check Aviva Plus with 200 strips and 200 lancets

## 2022-07-04 NOTE — Telephone Encounter (Signed)
Prior auth started via cover my meds.  Awaiting determination.  Key: [id number]

## 2022-07-04 NOTE — Telephone Encounter (Signed)
Per prior auth:  Preferred meters and test strips include: Roche (e.g., Accu-Check Aviva Plus, Accu-Check Guide, Accu-Chek Guide Me) or [employer] (e.g. TrueMetrix, TrueTrack).  Can we change?

## 2022-07-06 ENCOUNTER — Telehealth (INDEPENDENT_AMBULATORY_CARE_PROVIDER_SITE_OTHER): Payer: Medicare HMO | Admitting: Family

## 2022-07-06 ENCOUNTER — Other Ambulatory Visit: Payer: Self-pay | Admitting: Family

## 2022-07-06 DIAGNOSIS — I493 Ventricular premature depolarization: Secondary | ICD-10-CM | POA: Diagnosis not present

## 2022-07-06 DIAGNOSIS — I7121 Aneurysm of the ascending aorta, without rupture: Secondary | ICD-10-CM

## 2022-07-06 DIAGNOSIS — E1142 Type 2 diabetes mellitus with diabetic polyneuropathy: Secondary | ICD-10-CM

## 2022-07-06 DIAGNOSIS — F418 Other specified anxiety disorders: Secondary | ICD-10-CM

## 2022-07-06 DIAGNOSIS — J069 Acute upper respiratory infection, unspecified: Secondary | ICD-10-CM | POA: Insufficient documentation

## 2022-07-06 MED ORDER — METOPROLOL TARTRATE 75 MG PO TABS: 50.0000 mg | ORAL_TABLET | Freq: Two times a day (BID) | ORAL | 1 refills | Status: DC

## 2022-07-06 MED ORDER — DULOXETINE HCL 60 MG PO CPEP: 60.0000 mg | ORAL_CAPSULE | Freq: Every day | ORAL | 1 refills | Status: DC

## 2022-07-06 MED ORDER — DULOXETINE HCL 30 MG PO CPEP: 30.0000 mg | ORAL_CAPSULE | Freq: Every day | ORAL | 1 refills | Status: DC

## 2022-07-06 NOTE — Assessment & Plan Note (Signed)
Continue nebulizer and supportive measures. Call if symptoms worsen or if they do not improve.

## 2022-07-06 NOTE — Progress Notes (Signed)
MyChart Video Visit    Virtual Visit via Video Note   This visit type was conducted due to national recommendations for restrictions regarding the COVID-19 Pandemic (e.g. social distancing) in an effort to limit this patient's exposure and mitigate transmission in our community. This patient is at least at moderate risk for complications without adequate follow up. This format is felt to be most appropriate for this patient at this time. Physical exam was limited by quality of the video and audio technology used for the visit. CMA was able to get the patient set up on a video visit.  Patient location: Home Patient and provider in visit Provider location: Office  I discussed the limitations of evaluation and management by telemedicine and the availability of in person appointments. The patient expressed understanding and agreed to proceed.  Visit Date: 07/06/2022.  Today's healthcare provider: Nance Pear, NP     Subjective:    Patient ID: Cody Raymond, male    DOB: 07-29-1971, 51 y.o.   MRN: [id number]  Chief Complaint  Patient presents with   Follow-up    ED follow up   Nasal Congestion    Complains of nasal and chest congestion   Anxiety    Now taking 90 mg of duloxetine 90 mg a day (60 mg +30 mg tablet)    HPI Patient is in today for a virtual visit. He as seen in the ED on 06/02/22 for chest pain and it was felt to be non-cardiac in nature.  His beta blocker was increased to help with his palpitations and he was told that his symptoms were likely due to a panic attack. His cymbalta was increased from 29m to 924mHe feels that his anxiety and palpitations have both improved with these changes.   Refills- Patient is receiving 90 mg Duloxetine a day and 75 mg Metoprolol 2x a day refills this visit.  Hypertension-  BP Readings from Last 3 Encounters:  06/02/22 (!) 139/91  05/11/22 104/72  02/08/22 (!) 116/58   Pulse Readings from Last 3 Encounters:   06/02/22 97  05/11/22 78  02/08/22 60    Congestion- He reports congestion for several days with a productive cough with dark yellow sputum. Uses Mucinex and his nebulizer to relieve symptoms.   Past Medical History:  Diagnosis Date   [place]   Asthma    Blood transfusion    CAD (coronary artery disease), non obstructive on cath 2011 04/05/2012   Coronary artery disease    Diabetes mellitus    Diabetes mellitus without complication ([facility]   Enlarged prostate    GERD (gastroesophageal reflux disease)    H/O syncope    Heart disease    History of repair of patent ductus arteriosus 07/23/2016   Hyperlipidemia    Hypertension    Neuromuscular disorder ([facility]   DJD   Refusal of blood transfusions as patient is Jehovah's Witness     Past Surgical History:  Procedure Laterality Date   APPENDECTOMY     BACK SURGERY     CARDIAC CATHETERIZATION  2007   [place] 30% narrowing likely due to catheter induced spasm in 09/02/10.   CARDIAC CATHETERIZATION  09/02/2010   mod. nonobstructive disease in the RCA and CX, tortuous LAD   COLONOSCOPY W/ POLYPECTOMY  03/17/11   diminutive polyp   LOOP RECORDER EXPLANT N/A 01/14/2014   Procedure: LOOP RECORDER EXPLANT;  Surgeon: MiSanda KleinMD;  Location:  [facility];  Service: Cardiovascular;  Laterality: N/A;   LOOP RECORDER IMPLANT  04/06/2012   Reveal XT 4529   LOOP RECORDER IMPLANT N/A 04/06/2012   Procedure: LOOP RECORDER IMPLANT;  Surgeon: Sanda Klein, MD;  Location: [facility]ATH [facility];  Service: Cardiovascular;  Laterality: N/A;   NM MYOCAR PERF WALL MOTION  01/25/2008   mild anteroapical wall ischemia   PATENT DUCTUS ARTERIOUS REPAIR     at age 60    Family History  Problem Relation Age of Onset   Colon cancer Paternal Grandfather    Prostate cancer Paternal Grandfather    Aneurysm Father    Heart attack Father 84   Coronary artery disease Father    Colon polyps Mother    Heart disease  Mother    Coronary artery disease Mother    Migraines Mother    Breast cancer Maternal Grandmother    Colon cancer Paternal Uncle        x 2   Stomach cancer Brother    Liver disease Other        unsure who it was   Allergies Daughter     Social History   Socioeconomic History   Marital status: Married    Spouse name: Not on file   Number of children: Not on file   Years of education: Not on file   Highest education level: Not on file  Occupational History   Not on file  Tobacco Use   Smoking status: Never   Smokeless tobacco: Never  Vaping Use   Vaping Use: Never used  Substance and Sexual Activity   Alcohol use: No   Drug use: No   Sexual activity: Yes  Other Topics Concern   Not on file  Social History Narrative   ** Merged History Encounter **       Holter monitor 08/2010: PVCs and sinus tachy.   Sleep Study (02/2008): mild sleep apnea, no indication for CPAP.   Social Determinants of Health   Financial Resource Strain: Medium Risk (12/06/2021)   Overall Financial Resource Strain (CARDIA)    Difficulty of Paying Living Expenses: Somewhat hard  Food Insecurity: Food Insecurity Present (12/06/2021)   Hunger Vital Sign    Worried About Running Out of Food in the Last Year: Sometimes true    Ran Out of Food in the Last Year: Sometimes true  Transportation Needs: No Transportation Needs (12/06/2021)   PRAPARE - Hydrologist (Medical): No    Lack of Transportation (Non-Medical): No  Physical Activity: Insufficiently Active (12/06/2021)   Exercise Vital Sign    Days of Exercise per Week: 3 days    Minutes of Exercise per Session: 20 min  Stress: Stress Concern Present (12/06/2021)   [place]    Feeling of Stress : To some extent  Social Connections: Moderately Integrated (12/06/2021)   Social Connection and Isolation Panel [NHANES]    Frequency of Communication with  Friends and Family: More than three times a week    Frequency of Social Gatherings with Friends and Family: More than three times a week    Attends Religious Services: More than 4 times per year    Active Member of [employer] or Organizations: No    Attends Archivist Meetings: Never    Marital Status: Married  Human resources officer Violence: Not At Risk (12/06/2021)   Humiliation, Afraid, Rape, and Kick questionnaire    Fear of Current  or Ex-Partner: No    Emotionally Abused: No    Physically Abused: No    Sexually Abused: No    Outpatient Medications Prior to Visit  Medication Sig Dispense Refill   albuterol (PROVENTIL) (2.5 MG/3ML) 0.083% nebulizer solution      Ascorbic Acid (VITAMIN C) 1000 MG tablet Take 1,000 mg by mouth daily.     aspirin 325 MG tablet Take by mouth.     aspirin EC 81 MG EC tablet Take 1 tablet (81 mg total) by mouth daily. 30 tablet 0   atorvastatin (LIPITOR) 80 MG tablet      betamethasone dipropionate (DIPROLENE) 0.05 % cream Apply topically 2 (two) times daily. 30 g 0   cetirizine (ZYRTEC) 10 MG tablet Take 10 mg by mouth daily.     clonazePAM (KLONOPIN) 1 MG tablet TAKE 1/2 TABLET EVERY MORNING  AND TAKE 1 TABLET EVERY EVENING 45 tablet 0   ezetimibe (ZETIA) 10 MG tablet TAKE 1 TABLET EVERY DAY 90 tablet 1   fluticasone (FLONASE) 50 MCG/ACT nasal spray USE 2 SPRAYS IN EACH NOSTRIL ONE TIME DAILY 48 g 0   fluticasone-salmeterol (WIXELA INHUB) 250-50 MCG/ACT AEPB Inhale 1 puff into the lungs in the morning and at bedtime. 60 each 6   gabapentin (NEURONTIN) 300 MG capsule TAKE 1 CAPSULE AT 6AM, 11:30AM, 4:30PM. TAKE 3 CAPSULES AT 10PM 540 capsule 3   ibuprofen (IBU) 800 MG tablet      Icosapent Ethyl (VASCEPA) 1 g CAPS Take 2 capsules (2 g total) by mouth 2 (two) times daily. 120 capsule 0   ipratropium (ATROVENT) 0.03 % nasal spray Place 2 sprays into both nostrils every 12 (twelve) hours. 30 mL 12   ipratropium-albuterol (DUONEB) 0.5-2.5 (3) MG/3ML SOLN  INHALE 1 VIAL VIA NEBULIZATION EVERY 6 HOURS AS NEEDED 360 mL 1   isosorbide mononitrate (IMDUR) 30 MG 24 hr tablet Take by mouth.     ketoconazole (NIZORAL) 2 % shampoo Apply topically 2 (two) times a week. 360 mL 1   lansoprazole (PREVACID) 15 MG capsule Take by mouth.     lisinopril (ZESTRIL) 10 MG tablet Take 1 tablet by mouth daily.     losartan (COZAAR) 25 MG tablet Take by mouth.     meloxicam (MOBIC) 7.5 MG tablet TAKE 1 TABLET DAILY AS NEEDED FOR PAIN 90 tablet 1   metFORMIN (GLUCOPHAGE-XR) 500 MG 24 hr tablet Take 2 tablets (1,000 mg total) by mouth daily with breakfast. 180 tablet 3   montelukast (SINGULAIR) 10 MG tablet TAKE 1 TABLET AT BEDTIME 90 tablet 1   Multiple Vitamin (MULTIVITAMIN WITH MINERALS) TABS tablet Take 1 tablet by mouth daily.     nitroGLYCERIN (NITROSTAT) 0.4 MG SL tablet Place 1 tablet (0.4 mg total) under the tongue every 5 (five) minutes as needed for chest pain. 25 tablet PRN   pantoprazole (PROTONIX) 40 MG tablet Take 1 tablet by mouth daily.     promethazine-dextromethorphan (PROMETHAZINE-DM) 6.25-15 MG/5ML syrup Take 5 mLs by mouth 4 (four) times daily as needed. 118 mL 0   sucralfate (CARAFATE) 1 g tablet Take 1 tablet (1 g total) by mouth in the morning, at noon, and at bedtime. 21 tablet 0   tamsulosin (FLOMAX) 0.4 MG CAPS capsule TAKE 1 CAPSULE EVERY DAY 90 capsule 0   tiZANidine (ZANAFLEX) 2 MG tablet TAKE 1 TABLET EVERY 6 HOURS AS NEEDED FOR MUSCLE SPASM(S) 360 tablet 1   traMADol (ULTRAM) 50 MG tablet TAKE 1 TABLET TWICE DAILY 60 tablet  5   Zoster Vaccine Adjuvanted [facility]) injection Inject 0.53m IM now and repeat in 2-6 months. 0.5 mL 1   blood glucose meter kit and supplies Dispense Accu-Check Aviva Plus and supplies  (FOR ICD-10 E10.9, E11.9). Use twice a day to check glucose level Disp 200 test strips for accu-check aviva plus Disp 200 lancets for accu-check aviva plus 1 each 0   DULoxetine (CYMBALTA) 30 MG capsule Take 30 mg by mouth daily.      DULoxetine (CYMBALTA) 60 MG capsule TAKE 1 CAPSULE EVERY DAY 90 capsule 1   metoprolol tartrate 75 MG TABS Take 50 mg by mouth 2 (two) times daily. 60 tablet 0   ONETOUCH VERIO test strip CHECK BLOOD SUGAR 3 TIMES DAILY 300 strip 3   predniSONE (DELTASONE) 10 MG tablet      predniSONE (DELTASONE) 20 MG tablet Take 20 mg by mouth 2 (two) times daily.     silver sulfADIAZINE (SILVADENE) 1 % cream Apply 1 application topically daily. 50 g 0   traZODone (DESYREL) 100 MG tablet TAKE 1 TABLET AT BEDTIME 90 tablet 3   zolpidem (AMBIEN) 10 MG tablet Take by mouth.     DULoxetine (CYMBALTA) 30 MG capsule Take 1 capsule by mouth daily.     No facility-administered medications prior to visit.    Allergies  Allergen Reactions   Diphenhydramine     Other reaction(s): confusion   Benadryl [Diphenhydramine Hcl]     "drives me nuts"    ROS See HPI    Objective:    Physical Exam  There were no vitals taken for this visit. Wt Readings from Last 3 Encounters:  06/02/22 180 lb (81.6 kg)  05/11/22 192 lb (87.1 kg)  02/08/22 197 lb (89.4 kg)    Gen: Awake, alert, no acute distress Resp: Breathing is even and non-labored Psych: calm/pleasant demeanor Neuro: Alert and Oriented x 3, + facial symmetry, speech is clear.    Assessment & Plan:   Problem List Items Addressed This Visit       Unprioritized   Viral URI with cough - Primary    Continue nebulizer and supportive measures. Call if symptoms worsen or if they do not improve.       Thoracic aortic aneurysm (HCC)    3.4 Cm. Radiology recommends repeat imaging in 1 year.       Relevant Medications   Metoprolol Tartrate 75 MG TABS   Frequent PVCs    Symptoms improved with increased dose of metoprolol. Continue same.       Relevant Medications   Metoprolol Tartrate 75 MG TABS   Depression with anxiety    Anxiety is improved with increase of cymbalta from 615mto 9080mContinue same.       Relevant Medications   DULoxetine  (CYMBALTA) 30 MG capsule   DULoxetine (CYMBALTA) 60 MG capsule   Meds ordered this encounter  Medications   DULoxetine (CYMBALTA) 30 MG capsule    Sig: Take 1 capsule (30 mg total) by mouth daily.    Dispense:  90 capsule    Refill:  1    Order Specific Question:   Supervising Provider    Answer:   BLYPenni Homans[[id number]]   DULoxetine (CYMBALTA) 60 MG capsule    Sig: Take 1 capsule (60 mg total) by mouth daily.    Dispense:  90 capsule    Refill:  1    Order Specific Question:   Supervising Provider    Answer:  BLYTH, STACEY A [[id number]]   Metoprolol Tartrate 75 MG TABS    Sig: Take 50 mg by mouth 2 (two) times daily.    Dispense:  180 tablet    Refill:  1    Order Specific Question:   Supervising Provider    Answer:   Penni Homans A [[id number]]    I discussed the assessment and treatment plan with the patient. The patient was provided an opportunity to ask questions and all were answered. The patient agreed with the plan and demonstrated an understanding of the instructions.   The patient was advised to call back or seek an in-person evaluation if the symptoms worsen or if the condition fails to improve as anticipated.  I provided 20 minutes of face-to-face time during this encounter.   Nance Pear, NP [employer] [phone] (phone) [phone] (fax)  [place]

## 2022-07-06 NOTE — Assessment & Plan Note (Signed)
Anxiety is improved with increase of cymbalta from '60mg'$  to '90mg'$ . Continue same.

## 2022-07-06 NOTE — Assessment & Plan Note (Signed)
Symptoms improved with increased dose of metoprolol. Continue same.

## 2022-07-06 NOTE — Assessment & Plan Note (Signed)
3.4 Cm. Radiology recommends repeat imaging in 1 year.

## 2022-07-12 ENCOUNTER — Other Ambulatory Visit: Payer: Self-pay

## 2022-07-12 MED ORDER — METOPROLOL TARTRATE 75 MG PO TABS: ORAL_TABLET | ORAL | 1 refills | Status: DC

## 2022-07-14 ENCOUNTER — Telehealth: Payer: Self-pay | Admitting: Family

## 2022-07-14 NOTE — Telephone Encounter (Signed)
Called pharmacy and clarified per last ov note he is taking '60mg'$  capsules with 30 mg capsules for a total of 90 mg a day.

## 2022-07-14 NOTE — Telephone Encounter (Signed)
This is per last ov note

## 2022-07-14 NOTE — Telephone Encounter (Signed)
Pharmacy states they received two different doses for DULoxetine and they would like to know which on to give the pt. Please advise.    [facility] - [place], [address] [place], [place] [address] Phone: [phone]  Fax: [phone]

## 2022-07-15 ENCOUNTER — Other Ambulatory Visit: Payer: Self-pay | Admitting: Family

## 2022-07-17 ENCOUNTER — Other Ambulatory Visit: Payer: Self-pay | Admitting: Family

## 2022-07-18 NOTE — Telephone Encounter (Signed)
Requesting: clonazepam '1mg'$   Contract: 08/21/2019 UDS: 12/30/21 Last Visit: 07/06/22 Next Visit: None Last Refill: 05/26/22 #45 and 0RF   Please Advise

## 2022-07-20 ENCOUNTER — Encounter: Payer: Self-pay | Admitting: Family

## 2022-07-22 ENCOUNTER — Ambulatory Visit (INDEPENDENT_AMBULATORY_CARE_PROVIDER_SITE_OTHER): Payer: Medicare HMO | Admitting: Family

## 2022-07-22 ENCOUNTER — Encounter: Payer: Self-pay | Admitting: Family

## 2022-07-22 ENCOUNTER — Other Ambulatory Visit (HOSPITAL_BASED_OUTPATIENT_CLINIC_OR_DEPARTMENT_OTHER): Payer: Self-pay

## 2022-07-22 VITALS — BP 116/76 | HR 72 | Temp 98.0°F | Ht 68.0 in | Wt 179.2 lb

## 2022-07-22 DIAGNOSIS — J209 Acute bronchitis, unspecified: Secondary | ICD-10-CM

## 2022-07-22 DIAGNOSIS — R300 Dysuria: Secondary | ICD-10-CM | POA: Diagnosis not present

## 2022-07-22 DIAGNOSIS — Z23 Encounter for immunization: Secondary | ICD-10-CM | POA: Diagnosis not present

## 2022-07-22 DIAGNOSIS — J320 Chronic maxillary sinusitis: Secondary | ICD-10-CM

## 2022-07-22 LAB — URINALYSIS, ROUTINE W REFLEX MICROSCOPIC
Hgb urine dipstick: NEGATIVE
Ketones, ur: NEGATIVE
Leukocytes,Ua: NEGATIVE
Nitrite: NEGATIVE
Specific Gravity, Urine: 1.015 (ref 1.000–1.030)
Total Protein, Urine: NEGATIVE
Urine Glucose: NEGATIVE
Urobilinogen, UA: 0.2 (ref 0.0–1.0)
pH: 6 (ref 5.0–8.0)

## 2022-07-22 MED ORDER — AMOXICILLIN-POT CLAVULANATE 875-125 MG PO TABS
1.0000 | ORAL_TABLET | Freq: Two times a day (BID) | ORAL | 0 refills | Status: DC
  Filled 2022-07-22: qty 20, 10d supply, fill #0

## 2022-07-22 MED ORDER — PREDNISONE 10 MG PO TABS
ORAL_TABLET | ORAL | 0 refills | Status: AC
  Filled 2022-07-22: qty 20, 8d supply, fill #0

## 2022-07-22 NOTE — Patient Instructions (Signed)
Complete lab work prior to leaving. Complete chest x-ray on the first floor. Start prednisone and augmentin- continue albuterol every 4-6 hours. Call if symptoms worsen or if not improved in 3-4 days.

## 2022-07-22 NOTE — Assessment & Plan Note (Signed)
New. Rx with augmentin.

## 2022-07-22 NOTE — Progress Notes (Signed)
Subjective:   By signing my name below, I, [name], attest that this documentation has been prepared under the direction and in the presence of Karie Chimera, NP 07/22/2022   Patient ID: Cody Raymond, male    DOB: Aug 03, 1971, 51 y.o.   MRN: [id number]  Chief Complaint  Patient presents with   possible uri    Going 1.5 weeks     HPI Patient is in today for an office visit  Congestion: He complains of congestion and a runny nose. His runny nose is more prevalent when he is outdoors. His grandchildren as well as the patient tend to cough more frequently during the evenings. He is inquiring about the cause of those symptoms.   Left Lung Pain: He complains of left pain in his left lung. When taking a deep breath, he typically has to use a nebulizer. He also uses 0.03% of Atrovent and an additional nasal spray in a brown bottle  Dysuria: He complains of dysuria.   Immunizations: He is interested in receiving an influenza vaccine during today's visit.   Health Maintenance Due  Topic Date Due   Diabetic kidney evaluation - Urine ACR  11/21/2014   COVID-19 Vaccine (4 - Pfizer series) 10/13/2020   Zoster Vaccines- Shingrix (1 of 2) Never done   TETANUS/TDAP  04/04/2022    Past Medical History:  Diagnosis Date   Aneurysm ([place])    Asthma    Blood transfusion    CAD (coronary artery disease), non obstructive on cath 2011 04/05/2012   Coronary artery disease    Diabetes mellitus    Diabetes mellitus without complication ([place])    Enlarged prostate    GERD (gastroesophageal reflux disease)    H/O syncope    Heart disease    History of repair of patent ductus arteriosus 07/23/2016   Hyperlipidemia    Hypertension    Neuromuscular disorder ([place])    DJD   Refusal of blood transfusions as patient is Jehovah's Witness     Past Surgical History:  Procedure Laterality Date   APPENDECTOMY     BACK SURGERY     CARDIAC CATHETERIZATION  2007   Clean Cardiac Cath  ([place]), with RCA 30% narrowing likely due to catheter induced spasm in 09/02/10.   CARDIAC CATHETERIZATION  09/02/2010   mod. nonobstructive disease in the RCA and CX, tortuous LAD   COLONOSCOPY W/ POLYPECTOMY  03/17/11   diminutive polyp   LOOP RECORDER EXPLANT N/A 01/14/2014   Procedure: LOOP RECORDER EXPLANT;  Surgeon: Sanda Klein, MD;  Location: [facility];  Service: Cardiovascular;  Laterality: N/A;   LOOP RECORDER IMPLANT  04/06/2012   Reveal XT 4529   LOOP RECORDER IMPLANT N/A 04/06/2012   Procedure: LOOP RECORDER IMPLANT;  Surgeon: Sanda Klein, MD;  Location: [facility];  Service: Cardiovascular;  Laterality: N/A;   NM MYOCAR PERF WALL MOTION  01/25/2008   mild anteroapical wall ischemia   PATENT DUCTUS ARTERIOUS REPAIR     at age 77    Family History  Problem Relation Age of Onset   Colon cancer Paternal Grandfather    Prostate cancer Paternal Grandfather    Aneurysm Father    Heart attack Father 18   Coronary artery disease Father    Colon polyps Mother    Heart disease Mother    Coronary artery disease Mother    Migraines Mother    Breast cancer Maternal Grandmother    Colon cancer Paternal Uncle  x 2   Stomach cancer Brother    Liver disease Other        unsure who it was   Allergies Daughter     Social History   Socioeconomic History   Marital status: Married    Spouse name: Not on file   Number of children: Not on file   Years of education: Not on file   Highest education level: Not on file  Occupational History   Not on file  Tobacco Use   Smoking status: Never   Smokeless tobacco: Never  Vaping Use   Vaping Use: Never used  Substance and Sexual Activity   Alcohol use: No   Drug use: No   Sexual activity: Yes  Other Topics Concern   Not on file  Social History Narrative   ** Merged History Encounter **       Holter monitor 08/2010: PVCs and sinus tachy.   Sleep Study (02/2008): mild sleep apnea, no indication for CPAP.    Social Determinants of Health   Financial Resource Strain: Medium Risk (12/06/2021)   Overall Financial Resource Strain (CARDIA)    Difficulty of Paying Living Expenses: Somewhat hard  Food Insecurity: Food Insecurity Present (12/06/2021)   Hunger Vital Sign    Worried About Running Out of Food in the Last Year: Sometimes true    Ran Out of Food in the Last Year: Sometimes true  Transportation Needs: No Transportation Needs (12/06/2021)   PRAPARE - Hydrologist (Medical): No    Lack of Transportation (Non-Medical): No  Physical Activity: Insufficiently Active (12/06/2021)   Exercise Vital Sign    Days of Exercise per Week: 3 days    Minutes of Exercise per Session: 20 min  Stress: Stress Concern Present (12/06/2021)   [place]    Feeling of Stress : To some extent  Social Connections: Moderately Integrated (12/06/2021)   Social Connection and Isolation Panel [NHANES]    Frequency of Communication with Friends and Family: More than three times a week    Frequency of Social Gatherings with Friends and Family: More than three times a week    Attends Religious Services: More than 4 times per year    Active Member of [employer] or Organizations: No    Attends Archivist Meetings: Never    Marital Status: Married  Human resources officer Violence: Not At Risk (12/06/2021)   Humiliation, Afraid, Rape, and Kick questionnaire    Fear of Current or Ex-Partner: No    Emotionally Abused: No    Physically Abused: No    Sexually Abused: No    Outpatient Medications Prior to Visit  Medication Sig Dispense Refill   ACCU-CHEK GUIDE test strip TEST BLOOD GLUCOSE TWO TIMES DAILY 200 strip 10   Accu-Chek Softclix Lancets lancets TEST BLOOD SUGAR TWICE DAILY AS DIRECTED 200 each 10   albuterol (PROVENTIL) (2.5 MG/3ML) 0.083% nebulizer solution      Ascorbic Acid (VITAMIN C) 1000 MG tablet Take 1,000 mg by  mouth daily.     aspirin EC 81 MG EC tablet Take 1 tablet (81 mg total) by mouth daily. 30 tablet 0   atorvastatin (LIPITOR) 80 MG tablet TAKE 1 TABLET EVERY DAY 90 tablet 1   betamethasone dipropionate (DIPROLENE) 0.05 % cream Apply topically 2 (two) times daily. 30 g 0   Blood Glucose Monitoring Suppl (ACCU-CHEK GUIDE) w/Device KIT USE AS DIRECTED 1 kit 10   cetirizine (  ZYRTEC) 10 MG tablet Take 10 mg by mouth daily.     clonazePAM (KLONOPIN) 1 MG tablet TAKE 1/2 TABLET EVERY MORNING AND TAKE 1 TABLET EVERY EVENING 45 tablet 0   DULoxetine (CYMBALTA) 30 MG capsule Take 1 capsule (30 mg total) by mouth daily. 90 capsule 1   DULoxetine (CYMBALTA) 60 MG capsule Take 1 capsule (60 mg total) by mouth daily. 90 capsule 1   ezetimibe (ZETIA) 10 MG tablet TAKE 1 TABLET EVERY DAY 90 tablet 1   fluticasone (FLONASE) 50 MCG/ACT nasal spray USE 2 SPRAYS IN EACH NOSTRIL ONE TIME DAILY 48 g 0   fluticasone-salmeterol (WIXELA INHUB) 250-50 MCG/ACT AEPB Inhale 1 puff into the lungs in the morning and at bedtime. 60 each 6   gabapentin (NEURONTIN) 300 MG capsule TAKE 1 CAPSULE AT 6AM, 11:30AM, 4:30PM. TAKE 3 CAPSULES AT 10PM 540 capsule 3   ibuprofen (IBU) 800 MG tablet      Icosapent Ethyl (VASCEPA) 1 g CAPS Take 2 capsules (2 g total) by mouth 2 (two) times daily. 120 capsule 0   ipratropium (ATROVENT) 0.03 % nasal spray Place 2 sprays into both nostrils every 12 (twelve) hours. 30 mL 12   ipratropium-albuterol (DUONEB) 0.5-2.5 (3) MG/3ML SOLN INHALE 1 VIAL VIA NEBULIZATION EVERY 6 HOURS AS NEEDED 360 mL 1   isosorbide mononitrate (IMDUR) 30 MG 24 hr tablet Take by mouth.     ketoconazole (NIZORAL) 2 % shampoo Apply topically 2 (two) times a week. 360 mL 1   lisinopril (ZESTRIL) 10 MG tablet Take 1 tablet by mouth daily.     losartan (COZAAR) 25 MG tablet Take by mouth.     meloxicam (MOBIC) 7.5 MG tablet TAKE 1 TABLET DAILY AS NEEDED FOR PAIN 90 tablet 1   metFORMIN (GLUCOPHAGE-XR) 500 MG 24 hr tablet TAKE  2 TABLETS (1,000 MG TOTAL) BY MOUTH DAILY WITH BREAKFAST. 180 tablet 1   Metoprolol Tartrate 75 MG TABS Take 75 mg by mouth 2 times a day 180 tablet 1   montelukast (SINGULAIR) 10 MG tablet TAKE 1 TABLET AT BEDTIME 90 tablet 1   Multiple Vitamin (MULTIVITAMIN WITH MINERALS) TABS tablet Take 1 tablet by mouth daily.     pantoprazole (PROTONIX) 40 MG tablet Take 1 tablet (40 mg total) by mouth daily. 90 tablet 0   sucralfate (CARAFATE) 1 g tablet Take 1 tablet (1 g total) by mouth in the morning, at noon, and at bedtime. 21 tablet 0   tamsulosin (FLOMAX) 0.4 MG CAPS capsule TAKE 1 CAPSULE EVERY DAY 90 capsule 0   tiZANidine (ZANAFLEX) 2 MG tablet TAKE 1 TABLET EVERY 6 HOURS AS NEEDED FOR MUSCLE SPASM(S) 360 tablet 1   traMADol (ULTRAM) 50 MG tablet TAKE 1 TABLET TWICE DAILY 60 tablet 5   aspirin 325 MG tablet Take by mouth.     promethazine-dextromethorphan (PROMETHAZINE-DM) 6.25-15 MG/5ML syrup Take 5 mLs by mouth 4 (four) times daily as needed. 118 mL 0   Zoster Vaccine Adjuvanted (SHINGRIX) injection Inject 0.5mg IM now and repeat in 2-6 months. 0.5 mL 1   nitroGLYCERIN (NITROSTAT) 0.4 MG SL tablet Place 1 tablet (0.4 mg total) under the tongue every 5 (five) minutes as needed for chest pain. (Patient not taking: Reported on 07/22/2022) 25 tablet PRN   No facility-administered medications prior to visit.    Allergies  Allergen Reactions   Diphenhydramine     Other reaction(s): confusion   Benadryl [Diphenhydramine Hcl]     "drives me nuts"      Review of Systems  HENT:  Positive for congestion.        (+) Rhinorrhea  Respiratory:  Positive for cough (Evenings).        (+) Left Side Left Lung Pain  Genitourinary:  Positive for dysuria.       Objective:    Physical Exam Constitutional:      General: He is not in acute distress.    Appearance: Normal appearance. He is not ill-appearing.  HENT:     Head: Normocephalic and atraumatic.     Right Ear: Tympanic membrane, ear canal and  external ear normal.     Left Ear: Tympanic membrane, ear canal and external ear normal.  Eyes:     Extraocular Movements: Extraocular movements intact.     Pupils: Pupils are equal, round, and reactive to light.  Neck:     Thyroid: No thyromegaly.  Cardiovascular:     Rate and Rhythm: Normal rate and regular rhythm.     Heart sounds: Normal heart sounds. No murmur heard.    No gallop.  Pulmonary:     Effort: Pulmonary effort is normal. No respiratory distress.     Breath sounds: Wheezing (Bilateral, Expiratory) present. No rales.  Lymphadenopathy:     Cervical: No cervical adenopathy.  Skin:    General: Skin is warm and dry.  Neurological:     Mental Status: He is alert and oriented to person, place, and time.  Psychiatric:        Mood and Affect: Mood normal.        Behavior: Behavior normal.        Judgment: Judgment normal.     BP 116/76   Pulse 72   Temp 98 F (36.7 C) (Oral)   Ht 5' 8" (1.727 m)   Wt 179 lb 3.2 oz (81.3 kg)   SpO2 99%   BMI 27.25 kg/m  Wt Readings from Last 3 Encounters:  07/22/22 179 lb 3.2 oz (81.3 kg)  06/02/22 180 lb (81.6 kg)  05/11/22 192 lb (87.1 kg)       Assessment & Plan:   Problem List Items Addressed This Visit       Unprioritized   Maxillary sinusitis    New. Rx with augmentin.       Relevant Medications   amoxicillin-clavulanate (AUGMENTIN) 875-125 MG tablet   predniSONE (DELTASONE) 10 MG tablet   Bronchitis with bronchospasm    CXR/prednisone, albuterol, augmentin.       Relevant Medications   amoxicillin-clavulanate (AUGMENTIN) 875-125 MG tablet   predniSONE (DELTASONE) 10 MG tablet   Other Relevant Orders   DG Chest 2 View   Other Visit Diagnoses     Dysuria    -  Primary   Relevant Orders   Urinalysis, Routine w reflex microscopic   Urine Culture   Need for immunization against influenza       Relevant Orders   Flu Vaccine QUAD 6mo+IM (Fluarix, Fluzone & Alfiuria Quad PF) (Completed)      Meds  ordered this encounter  Medications   amoxicillin-clavulanate (AUGMENTIN) 875-125 MG tablet    Sig: Take 1 tablet by mouth 2 (two) times daily.    Dispense:  20 tablet    Refill:  0    Order Specific Question:   Supervising Provider    Answer:   BLYTH, STACEY A [[id number]]   predniSONE (DELTASONE) 10 MG tablet    Sig: Take 4 tablets (40 mg total) by mouth daily for 2 days, THEN   3 tablets (30 mg total) daily for 2 days, THEN 2 tablets (20 mg total) daily for 2 days, THEN 1 tablet (10 mg total) daily for 2 days.    Dispense:  20 tablet    Refill:  0    Order Specific Question:   Supervising Provider    Answer:   BLYTH, STACEY A [[id number]]    I, Melissa S O'Sullivan, NP, personally preformed the services described in this documentation.  All medical record entries made by the scribe were at my direction and in my presence.  I have reviewed the chart and discharge instructions (if applicable) and agree that the record reflects my personal performance and is accurate and complete. 07/22/2022   I,Amber Collins,acting as a scribe for Melissa S O'Sullivan, NP.,have documented all relevant documentation on the behalf of Melissa S O'Sullivan, NP,as directed by  Melissa S O'Sullivan, NP while in the presence of Melissa S O'Sullivan, NP.    Melissa S O'Sullivan, NP  

## 2022-07-22 NOTE — Assessment & Plan Note (Signed)
CXR/prednisone, albuterol, augmentin.

## 2022-07-25 ENCOUNTER — Telehealth: Payer: Self-pay | Admitting: Family

## 2022-07-25 LAB — URINE CULTURE
MICRO NUMBER:: 14141341
SPECIMEN QUALITY:: ADEQUATE

## 2022-07-25 MED ORDER — CIPROFLOXACIN HCL 500 MG PO TABS: 500.0000 mg | ORAL_TABLET | Freq: Two times a day (BID) | ORAL | 0 refills | Status: DC

## 2022-07-25 NOTE — Telephone Encounter (Signed)
Patient advised of results and new prescription and to not take tizanidine with the Cipro. He verbalized understanding

## 2022-07-25 NOTE — Telephone Encounter (Signed)
Urine culture is growing bacteria consistent with UTI.  Please start cipro bid x 7 days.  Also, please do not take tizanidine while taking cipro.

## 2022-07-27 ENCOUNTER — Encounter: Payer: Self-pay | Admitting: Family

## 2022-08-20 ENCOUNTER — Other Ambulatory Visit: Payer: Self-pay | Admitting: Family

## 2022-09-22 ENCOUNTER — Other Ambulatory Visit: Payer: Self-pay | Admitting: Family

## 2022-09-22 NOTE — Telephone Encounter (Signed)
Per patient he is not taking Losartan, only lisinopril. Rx sent and Losartan discontinued

## 2022-09-22 NOTE — Telephone Encounter (Signed)
Please contact pt and ask him if he is taking losartan or lisinopril?  He does not need both.  I received a refill request for lisinopril.  If he is taking lisinopril, please send refill and remove losartan from med list. tks

## 2022-09-26 ENCOUNTER — Other Ambulatory Visit: Payer: Self-pay | Admitting: Family

## 2022-10-04 ENCOUNTER — Other Ambulatory Visit: Payer: Self-pay | Admitting: Family

## 2022-10-04 MED ORDER — ZOLPIDEM TARTRATE 10 MG PO TABS: 10.0000 mg | ORAL_TABLET | Freq: Every evening | ORAL | 0 refills | Status: DC | PRN

## 2022-10-04 NOTE — Telephone Encounter (Signed)
Requesting: clonazepam '1mg'$   Contract: 08/21/2019 UDS: 12/30/21 Last Visit: 07/22/22 Next Visit: None Last Refill: 08/20/22 #45 and 0RF   Please Advise

## 2022-10-31 ENCOUNTER — Other Ambulatory Visit: Payer: Self-pay | Admitting: Pulmonary Disease

## 2022-10-31 ENCOUNTER — Other Ambulatory Visit: Payer: Self-pay | Admitting: Family

## 2022-10-31 DIAGNOSIS — J4541 Moderate persistent asthma with (acute) exacerbation: Secondary | ICD-10-CM

## 2022-11-08 ENCOUNTER — Ambulatory Visit (INDEPENDENT_AMBULATORY_CARE_PROVIDER_SITE_OTHER): Payer: Medicare HMO | Admitting: Family

## 2022-11-08 ENCOUNTER — Other Ambulatory Visit (HOSPITAL_BASED_OUTPATIENT_CLINIC_OR_DEPARTMENT_OTHER): Payer: Self-pay

## 2022-11-08 VITALS — BP 103/59 | HR 82 | Temp 97.9°F | Resp 16 | Wt 189.0 lb

## 2022-11-08 DIAGNOSIS — J069 Acute upper respiratory infection, unspecified: Secondary | ICD-10-CM | POA: Diagnosis not present

## 2022-11-08 DIAGNOSIS — H6692 Otitis media, unspecified, left ear: Secondary | ICD-10-CM | POA: Insufficient documentation

## 2022-11-08 DIAGNOSIS — R0981 Nasal congestion: Secondary | ICD-10-CM

## 2022-11-08 LAB — POC COVID19 BINAXNOW: SARS Coronavirus 2 Ag: NEGATIVE

## 2022-11-08 MED ORDER — AMOXICILLIN 500 MG PO CAPS
500.0000 mg | ORAL_CAPSULE | Freq: Three times a day (TID) | ORAL | 0 refills | Status: AC
  Filled 2022-11-08: qty 30, 10d supply, fill #0

## 2022-11-08 MED ORDER — PREDNISONE 10 MG PO TABS
ORAL_TABLET | ORAL | 0 refills | Status: DC
  Filled 2022-11-08: qty 20, 8d supply, fill #0

## 2022-11-08 NOTE — Assessment & Plan Note (Signed)
New.  Covid testing is negative. He does have some flare up of his asthma. Will begin prednisone taper. Pt is advised to call if his symptoms worsen or if symptoms fail to improve in the next few days.

## 2022-11-08 NOTE — Progress Notes (Addendum)
Subjective:   By signing my name below, I, Marlana Latus, attest that this documentation has been prepared under the direction and in the presence of Nance Pear, NP 11/08/22   Patient ID: Cody Raymond, male    DOB: May 09, 1971, 52 y.o.   MRN: [id number]  Chief Complaint  Patient presents with   Cough    Complains of cough, x 3 days   Nasal Congestion    Complains of nasal congestion    HPI Patient is in today for an office visit.   Covid Exposure: He reports he was recently exposed to Covid by his mother. He began feeling mild symptoms on Saturday and began feeling sore throat, fatigue, and headache today. He reports his grandchildren are also sick but were not tested for Covid at a doctor appointment they recently had.  Past Medical History:  Diagnosis Date   Aneurysm ([place])    Asthma    Blood transfusion    CAD (coronary artery disease), non obstructive on cath 2011 04/05/2012   Coronary artery disease    Diabetes mellitus    Diabetes mellitus without complication ([place])    Enlarged prostate    GERD (gastroesophageal reflux disease)    H/O syncope    Heart disease    History of repair of patent ductus arteriosus 07/23/2016   Hyperlipidemia    Hypertension    Neuromuscular disorder ([place])    DJD   Refusal of blood transfusions as patient is Jehovah's Witness     Past Surgical History:  Procedure Laterality Date   APPENDECTOMY     BACK SURGERY     CARDIAC CATHETERIZATION  2007   Clean Cardiac Cath ([facility]), with RCA 30% narrowing likely due to catheter induced spasm in 09/02/10.   CARDIAC CATHETERIZATION  09/02/2010   mod. nonobstructive disease in the RCA and CX, tortuous LAD   COLONOSCOPY W/ POLYPECTOMY  03/17/11   diminutive polyp   LOOP RECORDER EXPLANT N/A 01/14/2014   Procedure: LOOP RECORDER EXPLANT;  Surgeon: Sanda Klein, MD;  Location: [facility];  Service: Cardiovascular;  Laterality: N/A;   LOOP RECORDER IMPLANT  04/06/2012    Reveal XT [id number]   LOOP RECORDER IMPLANT N/A 04/06/2012   Procedure: LOOP RECORDER IMPLANT;  Surgeon: Sanda Klein, MD;  Location: [facility];  Service: Cardiovascular;  Laterality: N/A;   NM MYOCAR PERF WALL MOTION  01/25/2008   mild anteroapical wall ischemia   PATENT DUCTUS ARTERIOUS REPAIR     at age 89    Family History  Problem Relation Age of Onset   Colon cancer Paternal Grandfather    Prostate cancer Paternal Grandfather    Aneurysm Father    Heart attack Father 46   Coronary artery disease Father    Colon polyps Mother    Heart disease Mother    Coronary artery disease Mother    Migraines Mother    Breast cancer Maternal Grandmother    Colon cancer Paternal Uncle        x 2   Stomach cancer Brother    Liver disease Other        unsure who it was   Allergies Daughter     Social History   Socioeconomic History   Marital status: Married    Spouse name: Not on file   Number of children: Not on file   Years of education: Not on file   Highest education level: Not on file  Occupational History   Not on file  Tobacco Use   Smoking status: Never   Smokeless tobacco: Never  Vaping Use   Vaping Use: Never used  Substance and Sexual Activity   Alcohol use: No   Drug use: No   Sexual activity: Yes  Other Topics Concern   Not on file  Social History Narrative   ** Merged History Encounter **       Holter monitor 08/2010: PVCs and sinus tachy.   Sleep Study (02/2008): mild sleep apnea, no indication for CPAP.   Social Determinants of Health   Financial Resource Strain: Medium Risk (12/06/2021)   Overall Financial Resource Strain (CARDIA)    Difficulty of Paying Living Expenses: Somewhat hard  Food Insecurity: Food Insecurity Present (12/06/2021)   Hunger Vital Sign    Worried About Running Out of Food in the Last Year: Sometimes true    Ran Out of Food in the Last Year: Sometimes true  Transportation Needs: No Transportation Needs (12/06/2021)   PRAPARE -  Hydrologist (Medical): No    Lack of Transportation (Non-Medical): No  Physical Activity: Insufficiently Active (12/06/2021)   Exercise Vital Sign    Days of Exercise per Week: 3 days    Minutes of Exercise per Session: 20 min  Stress: Stress Concern Present (12/06/2021)   [place]    Feeling of Stress : To some extent  Social Connections: Moderately Integrated (12/06/2021)   Social Connection and Isolation Panel [NHANES]    Frequency of Communication with Friends and Family: More than three times a week    Frequency of Social Gatherings with Friends and Family: More than three times a week    Attends Religious Services: More than 4 times per year    Active Member of [employer] or Organizations: No    Attends Archivist Meetings: Never    Marital Status: Married  Human resources officer Violence: Not At Risk (12/06/2021)   Humiliation, Afraid, Rape, and Kick questionnaire    Fear of Current or Ex-Partner: No    Emotionally Abused: No    Physically Abused: No    Sexually Abused: No    Outpatient Medications Prior to Visit  Medication Sig Dispense Refill   zolpidem (AMBIEN) 10 MG tablet Take 1 tablet (10 mg total) by mouth at bedtime as needed for sleep. 30 tablet 0   ACCU-CHEK GUIDE test strip TEST BLOOD GLUCOSE TWO TIMES DAILY 200 strip 10   Accu-Chek Softclix Lancets lancets TEST BLOOD SUGAR TWICE DAILY AS DIRECTED 200 each 10   albuterol (PROVENTIL) (2.5 MG/3ML) 0.083% nebulizer solution      Ascorbic Acid (VITAMIN C) 1000 MG tablet Take 1,000 mg by mouth daily.     aspirin EC 81 MG EC tablet Take 1 tablet (81 mg total) by mouth daily. 30 tablet 0   atorvastatin (LIPITOR) 80 MG tablet TAKE 1 TABLET EVERY DAY 90 tablet 1   betamethasone dipropionate (DIPROLENE) 0.05 % cream Apply topically 2 (two) times daily. 30 g 0   Blood Glucose Monitoring Suppl (ACCU-CHEK GUIDE) w/Device KIT USE AS  DIRECTED 1 kit 10   cetirizine (ZYRTEC) 10 MG tablet Take 10 mg by mouth daily.     ciprofloxacin (CIPRO) 500 MG tablet Take 1 tablet (500 mg total) by mouth 2 (two) times daily. 14 tablet 0   clonazePAM (KLONOPIN) 1 MG tablet TAKE 1/2 TABLET EVERY MORNING AND TAKE 1 TABLET EVERY EVENING 45 tablet 0   DULoxetine (CYMBALTA) 30  MG capsule Take 1 capsule (30 mg total) by mouth daily. 90 capsule 1   DULoxetine (CYMBALTA) 60 MG capsule Take 1 capsule (60 mg total) by mouth daily. 90 capsule 1   ezetimibe (ZETIA) 10 MG tablet TAKE 1 TABLET EVERY DAY 90 tablet 1   fluticasone (FLONASE) 50 MCG/ACT nasal spray USE 2 SPRAYS IN EACH NOSTRIL ONE TIME DAILY 48 g 3   fluticasone-salmeterol (WIXELA INHUB) 250-50 MCG/ACT AEPB Inhale 1 puff into the lungs in the morning and at bedtime. 60 each 6   gabapentin (NEURONTIN) 300 MG capsule TAKE 1 CAPSULE AT 6AM, 11:30AM, 4:30PM AND TAKE 3 CAPSULES AT 10PM 540 capsule 3   ibuprofen (IBU) 800 MG tablet      Icosapent Ethyl (VASCEPA) 1 g CAPS Take 2 capsules (2 g total) by mouth 2 (two) times daily. 120 capsule 0   ipratropium (ATROVENT) 0.03 % nasal spray PLACE 2 SPRAYS INTO BOTH NOSTRILS EVERY 12 (TWELVE) HOURS. 60 mL 0   ipratropium-albuterol (DUONEB) 0.5-2.5 (3) MG/3ML SOLN INHALE 1 VIAL VIA NEBULIZATION EVERY 6 HOURS AS NEEDED 360 mL 1   isosorbide mononitrate (IMDUR) 30 MG 24 hr tablet Take by mouth.     ketoconazole (NIZORAL) 2 % shampoo Apply topically 2 (two) times a week. 360 mL 1   lisinopril (ZESTRIL) 10 MG tablet TAKE 1/2 TABLET EVERY DAY 45 tablet 3   meloxicam (MOBIC) 7.5 MG tablet TAKE 1 TABLET DAILY AS NEEDED FOR PAIN 90 tablet 1   metFORMIN (GLUCOPHAGE-XR) 500 MG 24 hr tablet TAKE 2 TABLETS (1,000 MG TOTAL) BY MOUTH DAILY WITH BREAKFAST. 180 tablet 1   Metoprolol Tartrate 75 MG TABS Take 75 mg by mouth 2 times a day 180 tablet 1   montelukast (SINGULAIR) 10 MG tablet TAKE 1 TABLET AT BEDTIME 90 tablet 1   Multiple Vitamin (MULTIVITAMIN WITH MINERALS)  TABS tablet Take 1 tablet by mouth daily.     nitroGLYCERIN (NITROSTAT) 0.4 MG SL tablet Place 1 tablet (0.4 mg total) under the tongue every 5 (five) minutes as needed for chest pain. (Patient not taking: Reported on 07/22/2022) 25 tablet PRN   pantoprazole (PROTONIX) 40 MG tablet Take 1 tablet (40 mg total) by mouth daily. 90 tablet 0   sucralfate (CARAFATE) 1 g tablet Take 1 tablet (1 g total) by mouth in the morning, at noon, and at bedtime. 21 tablet 0   tamsulosin (FLOMAX) 0.4 MG CAPS capsule TAKE 1 CAPSULE EVERY DAY 90 capsule 0   tiZANidine (ZANAFLEX) 2 MG tablet TAKE 1 TABLET EVERY 6 HOURS AS NEEDED FOR MUSCLE SPASM(S) 360 tablet 1   traMADol (ULTRAM) 50 MG tablet TAKE 1 TABLET TWICE DAILY 60 tablet 5   traZODone (DESYREL) 100 MG tablet TAKE 1 TABLET AT BEDTIME 90 tablet 1   No facility-administered medications prior to visit.    Allergies  Allergen Reactions   Diphenhydramine     Other reaction(s): confusion   Benadryl [Diphenhydramine Hcl]     "drives me nuts"    Review of Systems  Constitutional:  Positive for malaise/fatigue.  HENT:  Positive for sore throat.   Respiratory:  Positive for wheezing.   Neurological:  Positive for headaches.       Objective:    Physical Exam Constitutional:      Appearance: Normal appearance.  HENT:     Right Ear: Tympanic membrane normal.     Left Ear: Tympanic membrane is erythematous and retracted.  Cardiovascular:     Rate and Rhythm: Normal  rate and regular rhythm.     Heart sounds: Normal heart sounds.  Pulmonary:     Effort: Pulmonary effort is normal.     Breath sounds: Wheezing (bilateral expiratory wheeze) present.     Comments: Bilateral expiratory wheeze Skin:    General: Skin is warm and dry.  Neurological:     General: No focal deficit present.     Mental Status: He is alert and oriented to person, place, and time.     BP (!) 103/59 (BP Location: Right Arm, Patient Position: Sitting, Cuff Size: Small)   Pulse  82   Temp 97.9 F (36.6 C) (Oral)   Resp 16   Wt 189 lb (85.7 kg)   SpO2 99%   BMI 28.74 kg/m  Wt Readings from Last 3 Encounters:  11/08/22 189 lb (85.7 kg)  07/22/22 179 lb 3.2 oz (81.3 kg)  06/02/22 180 lb (81.6 kg)       Assessment & Plan:  Nasal congestion -     POC COVID-19 BinaxNow  Viral URI Assessment & Plan: New.  Covid testing is negative. He does have some flare up of his asthma. Will begin prednisone taper. Pt is advised to call if his symptoms worsen or if symptoms fail to improve in the next few days.    Left otitis media, unspecified otitis media type Assessment & Plan: New.  Will rx with amoxicillin.    Other orders -     predniSONE; Take 4 tablets by mouth once daily for 2 days, then 3 tablets daily for 2 days, then 2 tablets daily for 2 days, and then 1 tablet daily for 2 days.  Dispense: 20 tablet; Refill: 0 -     Amoxicillin; Take 1 capsule (500 mg total) by mouth 3 (three) times daily for 10 days.  Dispense: 30 capsule; Refill: 0     I,Rachel Rivera,acting as a scribe for Nance Pear, NP.,have documented all relevant documentation on the behalf of Nance Pear, NP,as directed by  Nance Pear, NP while in the presence of Nance Pear, NP.   I, Nance Pear, NP, personally preformed the services described in this documentation.  All medical record entries made by the scribe were at my direction and in my presence.  I have reviewed the chart and discharge instructions (if applicable) and agree that the record reflects my personal performance and is accurate and complete. 11/08/22   Nance Pear, NP

## 2022-11-08 NOTE — Assessment & Plan Note (Signed)
New.  Will rx with amoxicillin.

## 2022-11-08 NOTE — Patient Instructions (Signed)
Please start prednisone taper.  Continue albuterol every 4-6 hours as needed.

## 2022-11-28 ENCOUNTER — Other Ambulatory Visit: Payer: Self-pay | Admitting: Physical Medicine & Rehabilitation

## 2022-11-28 ENCOUNTER — Other Ambulatory Visit: Payer: Self-pay | Admitting: Family

## 2022-11-30 ENCOUNTER — Other Ambulatory Visit: Payer: Self-pay | Admitting: Family

## 2022-11-30 NOTE — Telephone Encounter (Signed)
PMP was Reviewed.  Note was reviewed.  He has an appointment next month with this provider.

## 2022-12-01 ENCOUNTER — Telehealth: Payer: Self-pay | Admitting: Family

## 2022-12-01 NOTE — Telephone Encounter (Signed)
Contacted Cody Raymond to schedule their annual wellness visit. Appointment made for 12/12/2022.  Sherol Dade; Care Guide Ambulatory Clinical [facility] Direct Dial: [phone]

## 2022-12-03 ENCOUNTER — Other Ambulatory Visit: Payer: Self-pay | Admitting: Family

## 2022-12-07 ENCOUNTER — Other Ambulatory Visit: Payer: Self-pay | Admitting: Registered Nurse

## 2022-12-07 ENCOUNTER — Other Ambulatory Visit: Payer: Self-pay | Admitting: Family

## 2022-12-07 DIAGNOSIS — M545 Low back pain, unspecified: Secondary | ICD-10-CM

## 2022-12-12 ENCOUNTER — Ambulatory Visit (INDEPENDENT_AMBULATORY_CARE_PROVIDER_SITE_OTHER): Payer: Medicare HMO | Admitting: *Deleted

## 2022-12-12 DIAGNOSIS — Z0000 Encounter for general adult medical examination without abnormal findings: Secondary | ICD-10-CM | POA: Diagnosis not present

## 2022-12-12 NOTE — Progress Notes (Signed)
Subjective:   Cody Raymond is a 52 y.o. male who presents for Medicare Annual/Subsequent preventive examination.  I connected with  Cody Raymond on 12/12/22 by a audio enabled telemedicine application and verified that I am speaking with the correct person using two identifiers.  Patient Location: Home  Provider Location: Office/Clinic  I discussed the limitations of evaluation and management by telemedicine. The patient expressed understanding and agreed to proceed.   Review of Systems     Cardiac Risk Factors include: male gender;diabetes mellitus;dyslipidemia;hypertension     Objective:    Today's Vitals   12/12/22 1501  PainSc: 8    There is no height or weight on file to calculate BMI.     12/12/2022    3:01 PM 06/02/2022   11:41 AM 12/06/2021   10:57 AM 12/28/2019    1:21 PM 05/27/2019   10:00 PM 12/14/2018   11:25 AM 10/26/2018    8:13 PM  Advanced Directives  Does Patient Have a Medical Advance Directive? Yes No Yes No No Yes No  Type of Materials engineer of [employer] Power of Attorney   Does patient want to make changes to medical advance directive?      No - Patient declined   Copy of [place] in Chart? No - copy requested  Yes - validated most recent copy scanned in chart (See row information)   No - copy requested   Would patient like information on creating a medical advance directive?    No - Patient declined No - Guardian declined  No - Patient declined    Current Medications (verified) Outpatient Encounter Medications as of 12/12/2022  Medication Sig   zolpidem (AMBIEN) 10 MG tablet Take 1 tablet (10 mg total) by mouth at bedtime as needed for sleep.   ACCU-CHEK GUIDE test strip TEST BLOOD GLUCOSE TWO TIMES DAILY   Accu-Chek Softclix Lancets lancets TEST BLOOD SUGAR TWICE DAILY AS DIRECTED   albuterol (PROVENTIL) (2.5 MG/3ML) 0.083% nebulizer solution    Ascorbic Acid  (VITAMIN C) 1000 MG tablet Take 1,000 mg by mouth daily.   aspirin EC 81 MG EC tablet Take 1 tablet (81 mg total) by mouth daily.   atorvastatin (LIPITOR) 80 MG tablet TAKE 1 TABLET EVERY DAY   betamethasone dipropionate (DIPROLENE) 0.05 % cream Apply topically 2 (two) times daily.   Blood Glucose Monitoring Suppl (ACCU-CHEK GUIDE) w/Device KIT USE AS DIRECTED   cetirizine (ZYRTEC) 10 MG tablet Take 10 mg by mouth daily.   clonazePAM (KLONOPIN) 1 MG tablet TAKE 1/2 TABLET EVERY MORNING AND TAKE 1 TABLET EVERY EVENING   DULoxetine (CYMBALTA) 30 MG capsule TAKE 1 CAPSULE EVERY DAY   DULoxetine (CYMBALTA) 60 MG capsule Take 1 capsule (60 mg total) by mouth daily.   ezetimibe (ZETIA) 10 MG tablet Take 1 tablet (10 mg total) by mouth daily.   fluticasone (FLONASE) 50 MCG/ACT nasal spray USE 2 SPRAYS IN EACH NOSTRIL ONE TIME DAILY   fluticasone-salmeterol (WIXELA INHUB) 250-50 MCG/ACT AEPB Inhale 1 puff into the lungs in the morning and at bedtime.   gabapentin (NEURONTIN) 300 MG capsule TAKE 1 CAPSULE AT 6AM, 11:30AM, 4:30PM AND TAKE 3 CAPSULES AT 10PM   ibuprofen (IBU) 800 MG tablet    Icosapent Ethyl (VASCEPA) 1 g CAPS Take 2 capsules (2 g total) by mouth 2 (two) times daily.   ipratropium (ATROVENT) 0.03 % nasal spray PLACE 2 SPRAYS INTO BOTH NOSTRILS EVERY  12 (TWELVE) HOURS.   ipratropium-albuterol (DUONEB) 0.5-2.5 (3) MG/3ML SOLN INHALE 1 VIAL VIA NEBULIZATION EVERY 6 HOURS AS NEEDED   isosorbide mononitrate (IMDUR) 30 MG 24 hr tablet Take by mouth.   ketoconazole (NIZORAL) 2 % shampoo Apply topically 2 (two) times a week.   lisinopril (ZESTRIL) 10 MG tablet TAKE 1/2 TABLET EVERY DAY   meloxicam (MOBIC) 7.5 MG tablet Take 1 tablet (7.5 mg total) by mouth daily as needed for pain.   metFORMIN (GLUCOPHAGE-XR) 500 MG 24 hr tablet TAKE 2 TABLETS EVERY DAY WITH BREAKFAST   Metoprolol Tartrate 75 MG TABS TAKE 1 TABLET TWICE DAILY   montelukast (SINGULAIR) 10 MG tablet Take 1 tablet (10 mg total) by  mouth at bedtime.   Multiple Vitamin (MULTIVITAMIN WITH MINERALS) TABS tablet Take 1 tablet by mouth daily.   nitroGLYCERIN (NITROSTAT) 0.4 MG SL tablet Place 1 tablet (0.4 mg total) under the tongue every 5 (five) minutes as needed for chest pain. (Patient not taking: Reported on 07/22/2022)   pantoprazole (PROTONIX) 40 MG tablet TAKE 1 TABLET EVERY DAY   sucralfate (CARAFATE) 1 g tablet Take 1 tablet (1 g total) by mouth in the morning, at noon, and at bedtime.   tamsulosin (FLOMAX) 0.4 MG CAPS capsule TAKE 1 CAPSULE EVERY DAY   tiZANidine (ZANAFLEX) 2 MG tablet TAKE 1 TABLET EVERY 6 HOURS AS NEEDED FOR MUSCLE SPASM(S)   traMADol (ULTRAM) 50 MG tablet TAKE 1 TABLET TWICE DAILY   traZODone (DESYREL) 100 MG tablet TAKE 1 TABLET AT BEDTIME   [DISCONTINUED] ciprofloxacin (CIPRO) 500 MG tablet Take 1 tablet (500 mg total) by mouth 2 (two) times daily.   [DISCONTINUED] predniSONE (DELTASONE) 10 MG tablet Take 4 tablets by mouth once daily for 2 days, then 3 tablets daily for 2 days, then 2 tablets daily for 2 days, and then 1 tablet daily for 2 days.   No facility-administered encounter medications on file as of 12/12/2022.    Allergies (verified) Diphenhydramine and Benadryl [diphenhydramine hcl]   History: Past Medical History:  Diagnosis Date   Aneurysm ([place])    Asthma    Blood transfusion    CAD (coronary artery disease), non obstructive on cath 2011 04/05/2012   Coronary artery disease    Diabetes mellitus    Diabetes mellitus without complication ([place])    Enlarged prostate    GERD (gastroesophageal reflux disease)    H/O syncope    Heart disease    History of repair of patent ductus arteriosus 07/23/2016   Hyperlipidemia    Hypertension    Neuromuscular disorder ([place])    DJD   Refusal of blood transfusions as patient is Jehovah's Witness    Past Surgical History:  Procedure Laterality Date   APPENDECTOMY     BACK SURGERY     CARDIAC CATHETERIZATION  2007   Clean Cardiac Cath  ([place]), with RCA 30% narrowing likely due to catheter induced spasm in 09/02/10.   CARDIAC CATHETERIZATION  09/02/2010   mod. nonobstructive disease in the RCA and CX, tortuous LAD   COLONOSCOPY W/ POLYPECTOMY  03/17/11   diminutive polyp   LOOP RECORDER EXPLANT N/A 01/14/2014   Procedure: LOOP RECORDER EXPLANT;  Surgeon: Sanda Klein, MD;  Location: [facility] CATH [facility];  Service: Cardiovascular;  Laterality: N/A;   LOOP RECORDER IMPLANT  04/06/2012   Reveal XT [id number]   LOOP RECORDER IMPLANT N/A 04/06/2012   Procedure: LOOP RECORDER IMPLANT;  Surgeon: Sanda Klein, MD;  Location: [facility]ATH [facility];  Service:  Cardiovascular;  Laterality: N/A;   NM MYOCAR PERF WALL MOTION  01/25/2008   mild anteroapical wall ischemia   PATENT DUCTUS ARTERIOUS REPAIR     at age 89   Family History  Problem Relation Age of Onset   Colon cancer Paternal Grandfather    Prostate cancer Paternal Grandfather    Aneurysm Father    Heart attack Father 63   Coronary artery disease Father    Colon polyps Mother    Heart disease Mother    Coronary artery disease Mother    Migraines Mother    Breast cancer Maternal Grandmother    Colon cancer Paternal Uncle        x 2   Stomach cancer Brother    Liver disease Other        unsure who it was   Allergies Daughter    Social History   Socioeconomic History   Marital status: Married    Spouse name: Not on file   Number of children: Not on file   Years of education: Not on file   Highest education level: Not on file  Occupational History   Not on file  Tobacco Use   Smoking status: Never   Smokeless tobacco: Never  Vaping Use   Vaping Use: Never used  Substance and Sexual Activity   Alcohol use: No   Drug use: No   Sexual activity: Yes  Other Topics Concern   Not on file  Social History Narrative   ** Merged History Encounter **       Holter monitor 08/2010: PVCs and sinus tachy.   Sleep Study (02/2008): mild sleep apnea, no indication for CPAP.    Social Determinants of Health   Financial Resource Strain: Medium Risk (12/06/2021)   Overall Financial Resource Strain (CARDIA)    Difficulty of Paying Living Expenses: Somewhat hard  Food Insecurity: Food Insecurity Present (12/12/2022)   Hunger Vital Sign    Worried About Running Out of Food in the Last Year: Often true    Ran Out of Food in the Last Year: Sometimes true  Transportation Needs: No Transportation Needs (12/12/2022)   PRAPARE - Hydrologist (Medical): No    Lack of Transportation (Non-Medical): No  Physical Activity: Insufficiently Active (12/06/2021)   Exercise Vital Sign    Days of Exercise per Week: 3 days    Minutes of Exercise per Session: 20 min  Stress: Stress Concern Present (12/06/2021)   [place]    Feeling of Stress : To some extent  Social Connections: Moderately Integrated (12/06/2021)   Social Connection and Isolation Panel [NHANES]    Frequency of Communication with Friends and Family: More than three times a week    Frequency of Social Gatherings with Friends and Family: More than three times a week    Attends Religious Services: More than 4 times per year    Active Member of [employer] or Organizations: No    Attends Music therapist: Never    Marital Status: Married    Tobacco Counseling Counseling given: Not Answered   Clinical Intake:  Pre-visit preparation completed: Yes  Pain : 0-10 Pain Score: 8  Pain Type: Chronic pain Pain Location: Back Pain Descriptors / Indicators: Aching Pain Onset: More than a month ago Pain Frequency: Constant  Nutritional Risks: Nausea/ vomitting/ diarrhea Diabetes: Yes CBG done?: No Did pt. bring in CBG monitor from home?: No  How often  do you need to have someone help you when you read instructions, pamphlets, or other written materials from your doctor or pharmacy?: 1 - Never   Activities of  Daily Living    12/12/2022    3:08 PM  In your present state of health, do you have any difficulty performing the following activities:  Hearing? 0  Vision? 0  Difficulty concentrating or making decisions? 1  Comment slight difficulty remembering things  Walking or climbing stairs? 1  Dressing or bathing? 1  Comment sometimes wife has to help if his back is hurting really bad  Doing errands, shopping? 0  Preparing Food and eating ? N  Using the Toilet? N  In the past six months, have you accidently leaked urine? Y  Do you have problems with loss of bowel control? N  Managing your Medications? N  Managing your Finances? N  Housekeeping or managing your Housekeeping? N    Patient Care Team: [name], NP as PCP - General (Internal Medicine) Lorretta Harp, MD as PCP - Cardiology (Cardiology) Sanda Klein, MD as Consulting Physician (Cardiology) Letta Pate Luanna Salk, MD as Consulting Physician (Pain Medicine) Almyra Deforest, [place] as Consulting Physician (Cardiology) Bayard Hugger, NP as Nurse Practitioner (Physical Medicine and Rehabilitation) Calvert Cantor, MD as Consulting Physician (Ophthalmology) [name], NP (Internal Medicine)  Indicate any recent Medical Services you may have received from other than [facility] providers in the past year (date may be approximate).     Assessment:   This is a routine wellness examination for Jaun.  Hearing/Vision screen No results found.  Dietary issues and exercise activities discussed: Current Exercise Habits: Home exercise routine, Type of exercise: treadmill, Time (Minutes): 15, Frequency (Times/Week): 3, Weekly Exercise (Minutes/Week): 45, Intensity: Mild, Exercise limited by: orthopedic condition(s)   Goals Addressed   None    Depression Screen    12/12/2022    3:07 PM 07/22/2022    8:27 AM 12/28/2021   10:27 AM 12/06/2021   11:05 AM 06/29/2021    9:57 AM 06/22/2021    3:52 PM 05/18/2020   11:02 AM  PHQ  2/9 Scores  PHQ - 2 Score 2 0 4 1 1 4 2   PHQ- 9 Score   11   9 6     Fall Risk    12/12/2022    3:05 PM 07/22/2022    8:27 AM 12/06/2021   11:02 AM 06/29/2021    9:56 AM 11/04/2019   10:18 AM  Fall Risk   Falls in the past year? 1 0 1 1 0  Comment tripped over something in the floor   Last fall in the Spring of 2022. Fell at home no injury.   Number falls in past yr: 0 0 0 0   Injury with Fall? 0 0 0 0   Risk for fall due to : History of fall(s)  History of fall(s)    Follow up Falls evaluation completed Falls evaluation completed Falls prevention discussed  Falls evaluation completed    [place]:  Any stairs in or around the home? Yes  If so, are there any without handrails? No  Home free of loose throw rugs in walkways, pet beds, electrical cords, etc? Yes  Adequate lighting in your home to reduce risk of falls? Yes   ASSISTIVE DEVICES UTILIZED TO PREVENT FALLS:  Life alert? No  Use of a cane, walker or w/c? No  Grab bars in the bathroom? No  Shower chair  or bench in shower? No  Elevated toilet seat or a handicapped toilet? No   TIMED UP AND GO:  Was the test performed?  No, audio visit .    Cognitive Function:    04/20/2016   10:07 AM  MMSE - Mini Mental State Exam  Orientation to time 4  Orientation to Place 5  Registration 3  Attention/ Calculation 5  Recall 1  Language- name 2 objects 2  Language- repeat 1  Language- follow 3 step command 3  Language- read & follow direction 1  Write a sentence 1  Copy design 1  Total score 27        12/12/2022    3:14 PM  6CIT Screen  What Year? 0 points  What month? 0 points  What time? 0 points  Count back from 20 2 points  Months in reverse 0 points  Repeat phrase 4 points  Total Score 6 points    Immunizations Immunization History  Administered Date(s) Administered   Influenza, Quadrivalent, Recombinant, Inj, Pf 07/05/2019   Influenza, Seasonal, Injecte, Preservative  Fre 10/22/2012   Influenza,inj,Quad PF,6+ Mos 07/29/2013, 07/08/2014, 06/20/2015, 09/30/2015, 06/08/2016, 07/25/2017, 07/31/2018, 07/22/2022   Influenza,inj,Quad PF,6-35 Mos 07/09/2019   PFIZER(Purple Top)SARS-COV-2 Vaccination 12/05/2019, 12/30/2019, 08/18/2020   Pneumococcal Polysaccharide-23 08/20/2014, 08/21/2019   Tdap 04/04/2012    TDAP status: Due, Education has been provided regarding the importance of this vaccine. Advised may receive this vaccine at local pharmacy or Health Dept. Aware to provide a copy of the vaccination record if obtained from local pharmacy or Health Dept. Verbalized acceptance and understanding.  Flu Vaccine status: Up to date  Pneumococcal vaccine status: Up to date  Covid-19 vaccine status: Information provided on how to obtain vaccines.   Qualifies for Shingles Vaccine? Yes   Zostavax completed No   Shingrix Completed?: No.    Education has been provided regarding the importance of this vaccine. Patient has been advised to call insurance company to determine out of pocket expense if they have not yet received this vaccine. Advised may also receive vaccine at local pharmacy or Health Dept. Verbalized acceptance and understanding.  Screening Tests Health Maintenance  Topic Date Due   Diabetic kidney evaluation - Urine ACR  11/21/2014   Zoster Vaccines- Shingrix (1 of 2) Never done   DTaP/Tdap/Td (2 - Td or Tdap) 04/04/2022   COVID-19 Vaccine (4 - 2023-24 season) 05/20/2022   HEMOGLOBIN A1C  11/11/2022   Medicare Annual Wellness (AWV)  12/07/2022   FOOT EXAM  12/29/2022   OPHTHALMOLOGY EXAM  04/08/2023   Diabetic kidney evaluation - eGFR measurement  06/03/2023   Fecal DNA (Cologuard)  12/06/2024   INFLUENZA VACCINE  Completed   Hepatitis C Screening  Completed   HIV Screening  Completed   HPV VACCINES  Aged Out    Health Maintenance  Health Maintenance Due  Topic Date Due   Diabetic kidney evaluation - Urine ACR  11/21/2014   Zoster  Vaccines- Shingrix (1 of 2) Never done   DTaP/Tdap/Td (2 - Td or Tdap) 04/04/2022   COVID-19 Vaccine (4 - 2023-24 season) 05/20/2022   HEMOGLOBIN A1C  11/11/2022   Medicare Annual Wellness (AWV)  12/07/2022    Colorectal cancer screening: Type of screening: Cologuard. Completed 12/06/21. Repeat every 3 years  Lung Cancer Screening: (Low Dose CT Chest recommended if Age 66-80 years, 30 pack-year currently smoking OR have quit w/in 15years.) does not qualify.   Additional Screening:  Hepatitis C Screening: does qualify; Completed 05/18/20  Vision Screening: Recommended annual ophthalmology exams for early detection of glaucoma and other disorders of the eye. Is the patient up to date with their annual eye exam?  Yes  Who is the provider or what is the name of the office in which the patient attends annual eye exams? Dr. Deloria Lair If pt is not established with a provider, would they like to be referred to a provider to establish care? No .   Dental Screening: Recommended annual dental exams for proper oral hygiene  Community Resource Referral / Chronic Care Management: CRR required this visit?  No   CCM required this visit?  No      Plan:     I have personally reviewed and noted the following in the patient's chart:   Medical and social history Use of alcohol, tobacco or illicit drugs  Current medications and supplements including opioid prescriptions. Patient is currently taking opioid prescriptions. Information provided to patient regarding non-opioid alternatives. Patient advised to discuss non-opioid treatment plan with their provider. Functional ability and status Nutritional status Physical activity Advanced directives List of other physicians Hospitalizations, surgeries, and ER visits in previous 12 months Vitals Screenings to include cognitive, depression, and falls Referrals and appointments  In addition, I have reviewed and discussed with patient certain  preventive protocols, quality metrics, and best practice recommendations. A written personalized care plan for preventive services as well as general preventive health recommendations were provided to patient.   Due to this being a telephonic visit, the after visit summary with patients personalized plan was offered to patient via mail or my-chart. Patient would like to access on my-chart.  Beatris Ship, [place]   12/12/2022   Nurse Notes: None

## 2022-12-12 NOTE — Patient Instructions (Signed)
Cody Raymond , Thank you for taking time to come for your Medicare Wellness Visit. I appreciate your ongoing commitment to your health goals. Please review the following plan we discussed and let me know if I can assist you in the future.     This is a list of the screening recommended for you and due dates:  Health Maintenance  Topic Date Due   Yearly kidney health urinalysis for diabetes  11/21/2014   Zoster (Shingles) Vaccine (1 of 2) Never done   DTaP/Tdap/Td vaccine (2 - Td or Tdap) 04/04/2022   COVID-19 Vaccine (4 - 2023-24 season) 05/20/2022   Hemoglobin A1C  11/11/2022   Complete foot exam   12/29/2022   Eye exam for diabetics  04/08/2023   Yearly kidney function blood test for diabetes  06/03/2023   Medicare Annual Wellness Visit  12/12/2023   Cologuard (Stool DNA test)  12/06/2024   Flu Shot  Completed   Hepatitis C Screening: USPSTF Recommendation to screen - Ages 18-79 yo.  Completed   HIV Screening  Completed   HPV Vaccine  Aged Out    Next appointment: Follow up in one year for your annual wellness visit.  Preventive Care 40-64 Years, Male Preventive care refers to lifestyle choices and visits with your health care provider that can promote health and wellness. What does preventive care include? A yearly physical exam. This is also called an annual well check. Dental exams once or twice a year. Routine eye exams. Ask your health care provider how often you should have your eyes checked. Personal lifestyle choices, including: Daily care of your teeth and gums. Regular physical activity. Eating a healthy diet. Avoiding tobacco and drug use. Limiting alcohol use. Practicing safe sex. Taking low-dose aspirin every day starting at age 36. What happens during an annual well check? The services and screenings done by your health care provider during your annual well check will depend on your age, overall health, lifestyle risk factors, and family history of  disease. Counseling  Your health care provider may ask you questions about your: Alcohol use. Tobacco use. Drug use. Emotional well-being. Home and relationship well-being. Sexual activity. Eating habits. Work and work Statistician. Screening  You may have the following tests or measurements: Height, weight, and BMI. Blood pressure. Lipid and cholesterol levels. These may be checked every 5 years, or more frequently if you are over 33 years old. Skin check. Lung cancer screening. You may have this screening every year starting at age 53 if you have a 30-pack-year history of smoking and currently smoke or have quit within the past 15 years. Fecal occult blood test (FOBT) of the stool. You may have this test every year starting at age 4. Flexible sigmoidoscopy or colonoscopy. You may have a sigmoidoscopy every 5 years or a colonoscopy every 10 years starting at age 6. Prostate cancer screening. Recommendations will vary depending on your family history and other risks. Hepatitis C blood test. Hepatitis B blood test. Sexually transmitted disease (STD) testing. Diabetes screening. This is done by checking your blood sugar (glucose) after you have not eaten for a while (fasting). You may have this done every 1-3 years. Discuss your test results, treatment options, and if necessary, the need for more tests with your health care provider. Vaccines  Your health care provider may recommend certain vaccines, such as: Influenza vaccine. This is recommended every year. Tetanus, diphtheria, and acellular pertussis (Tdap, Td) vaccine. You may need a Td booster every 10 years.  Zoster vaccine. You may need this after age 69. Pneumococcal 13-valent conjugate (PCV13) vaccine. You may need this if you have certain conditions and have not been vaccinated. Pneumococcal polysaccharide (PPSV23) vaccine. You may need one or two doses if you smoke cigarettes or if you have certain conditions. Talk to your  health care provider about which screenings and vaccines you need and how often you need them. This information is not intended to replace advice given to you by your health care provider. Make sure you discuss any questions you have with your health care provider. Document Released: 10/02/2015 Document Revised: 05/25/2016 Document Reviewed: 07/07/2015 Elsevier Interactive Patient Education  2017 [place] Prevention in the Home Falls can cause injuries. They can happen to people of all ages. There are many things you can do to make your home safe and to help prevent falls. What can I do on the outside of my home? Regularly fix the edges of walkways and driveways and fix any cracks. Remove anything that might make you trip as you walk through a door, such as a raised step or threshold. Trim any bushes or trees on the path to your home. Use bright outdoor lighting. Clear any walking paths of anything that might make someone trip, such as rocks or tools. Regularly check to see if handrails are loose or broken. Make sure that both sides of any steps have handrails. Any raised decks and porches should have guardrails on the edges. Have any leaves, snow, or ice cleared regularly. Use sand or salt on walking paths during winter. Clean up any spills in your garage right away. This includes oil or grease spills. What can I do in the bathroom? Use night lights. Install grab bars by the toilet and in the tub and shower. Do not use towel bars as grab bars. Use non-skid mats or decals in the tub or shower. If you need to sit down in the shower, use a plastic, non-slip stool. Keep the floor dry. Clean up any water that spills on the floor as soon as it happens. Remove soap buildup in the tub or shower regularly. Attach bath mats securely with double-sided non-slip rug tape. Do not have throw rugs and other things on the floor that can make you trip. What can I do in the bedroom? Use night  lights. Make sure that you have a light by your bed that is easy to reach. Do not use any sheets or blankets that are too big for your bed. They should not hang down onto the floor. Have a firm chair that has side arms. You can use this for support while you get dressed. Do not have throw rugs and other things on the floor that can make you trip. What can I do in the kitchen? Clean up any spills right away. Avoid walking on wet floors. Keep items that you use a lot in easy-to-reach places. If you need to reach something above you, use a strong step stool that has a grab bar. Keep electrical cords out of the way. Do not use floor polish or wax that makes floors slippery. If you must use wax, use non-skid floor wax. Do not have throw rugs and other things on the floor that can make you trip. What can I do with my stairs? Do not leave any items on the stairs. Make sure that there are handrails on both sides of the stairs and use them. Fix handrails that are broken or loose.  Make sure that handrails are as long as the stairways. Check any carpeting to make sure that it is firmly attached to the stairs. Fix any carpet that is loose or worn. Avoid having throw rugs at the top or bottom of the stairs. If you do have throw rugs, attach them to the floor with carpet tape. Make sure that you have a light switch at the top of the stairs and the bottom of the stairs. If you do not have them, ask someone to add them for you. What else can I do to help prevent falls? Wear shoes that: Do not have high heels. Have rubber bottoms. Are comfortable and fit you well. Are closed at the toe. Do not wear sandals. If you use a stepladder: Make sure that it is fully opened. Do not climb a closed stepladder. Make sure that both sides of the stepladder are locked into place. Ask someone to hold it for you, if possible. Clearly mark and make sure that you can see: Any grab bars or handrails. First and last  steps. Where the edge of each step is. Use tools that help you move around (mobility aids) if they are needed. These include: Canes. Walkers. Scooters. Crutches. Turn on the lights when you go into a dark area. Replace any light bulbs as soon as they burn out. Set up your furniture so you have a clear path. Avoid moving your furniture around. If any of your floors are uneven, fix them. If there are any pets around you, be aware of where they are. Review your medicines with your doctor. Some medicines can make you feel dizzy. This can increase your chance of falling. Ask your doctor what other things that you can do to help prevent falls. This information is not intended to replace advice given to you by your health care provider. Make sure you discuss any questions you have with your health care provider. Document Released: 07/02/2009 Document Revised: 02/11/2016 Document Reviewed: 10/10/2014 Elsevier Interactive Patient Education  2017 [employer].

## 2022-12-15 ENCOUNTER — Encounter: Payer: Self-pay | Admitting: Family

## 2022-12-15 ENCOUNTER — Ambulatory Visit: Payer: Medicare HMO | Admitting: Family

## 2022-12-15 NOTE — Telephone Encounter (Signed)
Spoke to patient and advised him go to the ER for IV zofran and IV fluids.  He is agreeable and voices understanding.

## 2022-12-15 NOTE — Telephone Encounter (Signed)
Was advised per pcp to go to the ED for IV hydration

## 2022-12-21 ENCOUNTER — Encounter: Payer: Self-pay | Admitting: Registered Nurse

## 2022-12-21 ENCOUNTER — Encounter: Payer: Medicare HMO | Attending: Registered Nurse | Admitting: Registered Nurse

## 2022-12-21 VITALS — BP 118/77 | HR 68 | Ht 68.0 in | Wt 190.0 lb

## 2022-12-21 DIAGNOSIS — M47816 Spondylosis without myelopathy or radiculopathy, lumbar region: Secondary | ICD-10-CM

## 2022-12-21 DIAGNOSIS — Z5181 Encounter for therapeutic drug level monitoring: Secondary | ICD-10-CM | POA: Diagnosis not present

## 2022-12-21 DIAGNOSIS — M545 Low back pain, unspecified: Secondary | ICD-10-CM

## 2022-12-21 DIAGNOSIS — M5416 Radiculopathy, lumbar region: Secondary | ICD-10-CM

## 2022-12-21 DIAGNOSIS — G894 Chronic pain syndrome: Secondary | ICD-10-CM | POA: Diagnosis not present

## 2022-12-21 DIAGNOSIS — M1612 Unilateral primary osteoarthritis, left hip: Secondary | ICD-10-CM | POA: Diagnosis not present

## 2022-12-21 DIAGNOSIS — M25552 Pain in left hip: Secondary | ICD-10-CM | POA: Diagnosis not present

## 2022-12-21 DIAGNOSIS — M961 Postlaminectomy syndrome, not elsewhere classified: Secondary | ICD-10-CM | POA: Diagnosis not present

## 2022-12-21 DIAGNOSIS — G8929 Other chronic pain: Secondary | ICD-10-CM | POA: Diagnosis not present

## 2022-12-21 DIAGNOSIS — Z79891 Long term (current) use of opiate analgesic: Secondary | ICD-10-CM

## 2022-12-21 NOTE — Patient Instructions (Signed)
Hold Tramadol at this time.   I will call and speak with the pharmacist and Dr Letta Pate  and give you a call.

## 2022-12-21 NOTE — Progress Notes (Signed)
Subjective:    Patient ID: Cody Raymond, male    DOB: 1971/05/20, 52 y.o.   MRN: [id number]  HPI: Cody Raymond is a 52 y.o. male who returns for follow up appointment for chronic pain and medication refill. He states his pain is located in his lower back radiating into his left grin and left lower extremity. Also reports his pain has intensified, denies falling. We will order X-ray he verbalizes understanding. He also reports left hip pain, X-ray ordered. He  rates his pain 8. His current exercise regime is walking and performing stretching exercises.  He also states his PCP believes he's having interaction between the Tramadol and his Cymbalta, he reports he's having headaches. He also states when he doesn't take the Tramadol he doesn't have the headaches. He was instructed not to take the Tramadol, he verbalizes understanding.   This provider spoke with Pharmacist at [facility] she wasn't aware of the above interaction. I will speak with Dr Letta Pate on 12/22/2022 and give Cody Raymond a call.   Cody Raymond Morphine equivalent is 20.00 MME.   UDS ordered today.    Pain Inventory Average Pain 7 Pain Right Now 8 My pain is constant, sharp, burning, dull, stabbing, tingling, and aching  In the last 24 hours, has pain interfered with the following? General activity 4 Relation with others 4 Enjoyment of life 4 What TIME of day is your pain at its worst? morning , daytime, evening, and night Sleep (in general) Poor  Pain is worse with: walking, bending, sitting, inactivity, standing, and some activites Pain improves with: rest, heat/ice, and medication Relief from Meds: 5  Family History  Problem Relation Age of Onset   Colon cancer Paternal Grandfather    Prostate cancer Paternal Grandfather    Aneurysm Father    Heart attack Father 27   Coronary artery disease Father    Colon polyps Mother    Heart disease Mother    Coronary artery disease Mother    Migraines Mother     Breast cancer Maternal Grandmother    Colon cancer Paternal Uncle        x 2   Stomach cancer Brother    Liver disease Other        unsure who it was   Allergies Daughter    Social History   Socioeconomic History   Marital status: Married    Spouse name: Not on file   Number of children: Not on file   Years of education: Not on file   Highest education level: Not on file  Occupational History   Not on file  Tobacco Use   Smoking status: Never   Smokeless tobacco: Never  Vaping Use   Vaping Use: Never used  Substance and Sexual Activity   Alcohol use: No   Drug use: No   Sexual activity: Yes  Other Topics Concern   Not on file  Social History Narrative   ** Merged History Encounter **       Holter monitor 08/2010: PVCs and sinus tachy.   Sleep Study (02/2008): mild sleep apnea, no indication for CPAP.   Social Determinants of Health   Financial Resource Strain: Medium Risk (12/06/2021)   Overall Financial Resource Strain (CARDIA)    Difficulty of Paying Living Expenses: Somewhat hard  Food Insecurity: Food Insecurity Present (12/12/2022)   Hunger Vital Sign    Worried About Running Out of Food in the Last Year: Often true    Ran Out of  Food in the Last Year: Sometimes true  Transportation Needs: No Transportation Needs (12/12/2022)   PRAPARE - Hydrologist (Medical): No    Lack of Transportation (Non-Medical): No  Physical Activity: Insufficiently Active (12/06/2021)   Exercise Vital Sign    Days of Exercise per Week: 3 days    Minutes of Exercise per Session: 20 min  Stress: Stress Concern Present (12/06/2021)   [place]    Feeling of Stress : To some extent  Social Connections: Moderately Integrated (12/06/2021)   Social Connection and Isolation Panel [NHANES]    Frequency of Communication with Friends and Family: More than three times a week    Frequency of Social  Gatherings with Friends and Family: More than three times a week    Attends Religious Services: More than 4 times per year    Active Member of [employer] or Organizations: No    Attends Archivist Meetings: Never    Marital Status: Married   Past Surgical History:  Procedure Laterality Date   [place]  2007   Clean Cardiac Cath ([place]), with RCA 30% narrowing likely due to catheter induced spasm in 09/02/10.   CARDIAC CATHETERIZATION  09/02/2010   mod. nonobstructive disease in the RCA and CX, tortuous LAD   COLONOSCOPY W/ POLYPECTOMY  03/17/11   diminutive polyp   LOOP RECORDER EXPLANT N/A 01/14/2014   Procedure: LOOP RECORDER EXPLANT;  Surgeon: Sanda Klein, MD;  Location: [facility];  Service: Cardiovascular;  Laterality: N/A;   LOOP RECORDER IMPLANT  04/06/2012   Reveal XT 4529   LOOP RECORDER IMPLANT N/A 04/06/2012   Procedure: LOOP RECORDER IMPLANT;  Surgeon: Sanda Klein, MD;  Location: [facility];  Service: Cardiovascular;  Laterality: N/A;   NM MYOCAR PERF WALL MOTION  01/25/2008   mild anteroapical wall ischemia   PATENT DUCTUS ARTERIOUS REPAIR     at age 89   Past Surgical History:  Procedure Laterality Date   APPENDECTOMY     BACK SURGERY     CARDIAC CATHETERIZATION  2007   Clean Cardiac Cath ([place]), with RCA 30% narrowing likely due to catheter induced spasm in 09/02/10.   CARDIAC CATHETERIZATION  09/02/2010   mod. nonobstructive disease in the RCA and CX, tortuous LAD   COLONOSCOPY W/ POLYPECTOMY  03/17/11   diminutive polyp   LOOP RECORDER EXPLANT N/A 01/14/2014   Procedure: LOOP RECORDER EXPLANT;  Surgeon: Sanda Klein, MD;  Location: [facility];  Service: Cardiovascular;  Laterality: N/A;   LOOP RECORDER IMPLANT  04/06/2012   Reveal XT 4529   LOOP RECORDER IMPLANT N/A 04/06/2012   Procedure: LOOP RECORDER IMPLANT;  Surgeon: Sanda Klein, MD;  Location: [facility];  Service:  Cardiovascular;  Laterality: N/A;   NM MYOCAR PERF WALL MOTION  01/25/2008   mild anteroapical wall ischemia   PATENT DUCTUS ARTERIOUS REPAIR     at age 67   Past Medical History:  Diagnosis Date   Aneurysm    Asthma    Blood transfusion    CAD (coronary artery disease), non obstructive on cath 2011 04/05/2012   Coronary artery disease    Diabetes mellitus    Diabetes mellitus without complication    Enlarged prostate    GERD (gastroesophageal reflux disease)    H/O syncope    Heart disease    History of  repair of patent ductus arteriosus 07/23/2016   Hyperlipidemia    Hypertension    Neuromuscular disorder    DJD   Refusal of blood transfusions as patient is Jehovah's Witness    Ht 5\' 8"  (1.727 m)   Wt 190 lb (86.2 kg)   BMI 28.89 kg/m   Opioid Risk Score:   Fall Risk Score:  `1  Depression screen PHQ 2/9     12/21/2022    1:05 PM 12/12/2022    3:07 PM 07/22/2022    8:27 AM 12/28/2021   10:27 AM 12/06/2021   11:05 AM 06/29/2021    9:57 AM 06/22/2021    3:52 PM  Depression screen PHQ 2/9  Decreased Interest 0 1 0 2 0 1 2  Down, Depressed, Hopeless 0 1 0 2 1  2   PHQ - 2 Score 0 2 0 4 1 1 4   Altered sleeping    2   2  Tired, decreased energy    2   1  Change in appetite    1   1  Feeling bad or failure about yourself     1   0  Trouble concentrating    1   0  Moving slowly or fidgety/restless    0   1  Suicidal thoughts    0   0  PHQ-9 Score    11   9  Difficult doing work/chores       Somewhat difficult      Review of Systems  Musculoskeletal:  Positive for back pain and gait problem.  All other systems reviewed and are negative.     Objective:   Physical Exam Vitals and nursing note reviewed.  Constitutional:      Appearance: Normal appearance.  Cardiovascular:     Rate and Rhythm: Normal rate and regular rhythm.     Pulses: Normal pulses.     Heart sounds: Normal heart sounds.  Pulmonary:     Effort: Pulmonary effort is normal.     Breath sounds:  Normal breath sounds.  Musculoskeletal:     Cervical back: Normal range of motion and neck supple.     Comments: Normal Muscle Bulk and Muscle Testing Reveals: Upper Extremities: ROM and Muscle Strength Thoracic, Lumbar Lower Extremities Gait     Skin:    General: Skin is warm and dry.  Neurological:     Mental Status: He is alert and oriented to person, place, and time.  Psychiatric:        Mood and Affect: Mood normal.        Behavior: Behavior normal.           Assessment & Plan:  Acute Exacerbation of Chronic Low Back Pain: RX: Lumbar X-ray. 12/21/2022 2. Left Hip Pain: RX: Hip X-ray. Continue to monitor.12/21/2022 3.Post Laminectomy Lumbar: Continue HEP as tolerated. Continue current medication regimen. Continue to Monitor. 12/21/2022 4. Lumbar Facet Arthropathy: Continue HEP as Tolerated . Continue to Monitor.12/21/2022 5. Left  Lumbar Radiculitis: Continue Gabapentin. Continue Monitor. 01/10/2023 4. Muscle Spasm: Continue Tizanidine. Continue to Monitor. 01/10/2023 5. Chronic Pain Syndrome: Hold Tramadol interaction with Cymbalta per patient. Spoke with pharmacist at [facility] and Will speak with Dr Letta Pate regarding the above. We will continue the opioid monitoring program, this consists of regular clinic visits, examinations, urine drug screen, pill counts as well as use of [place] Controlled Substance Reporting system. A 12 month History has been reviewed on the [place]  on 12/21/2022   F/U in 3 months.

## 2022-12-22 ENCOUNTER — Ambulatory Visit
Admission: RE | Admit: 2022-12-22 | Discharge: 2022-12-22 | Disposition: A | Discharge status: Home / Self Care | Payer: Medicare HMO | Source: Ambulatory Visit | Attending: Registered Nurse | Admitting: Registered Nurse

## 2022-12-22 DIAGNOSIS — M25552 Pain in left hip: Secondary | ICD-10-CM | POA: Diagnosis not present

## 2022-12-22 DIAGNOSIS — M47816 Spondylosis without myelopathy or radiculopathy, lumbar region: Secondary | ICD-10-CM | POA: Diagnosis not present

## 2022-12-22 DIAGNOSIS — G894 Chronic pain syndrome: Secondary | ICD-10-CM | POA: Diagnosis not present

## 2022-12-22 DIAGNOSIS — Z5181 Encounter for therapeutic drug level monitoring: Secondary | ICD-10-CM | POA: Diagnosis not present

## 2022-12-22 DIAGNOSIS — M4126 Other idiopathic scoliosis, lumbar region: Secondary | ICD-10-CM | POA: Diagnosis not present

## 2022-12-22 DIAGNOSIS — Z79891 Long term (current) use of opiate analgesic: Secondary | ICD-10-CM | POA: Diagnosis not present

## 2022-12-22 DIAGNOSIS — M545 Low back pain, unspecified: Secondary | ICD-10-CM | POA: Diagnosis not present

## 2022-12-26 ENCOUNTER — Other Ambulatory Visit: Payer: Self-pay | Admitting: Registered Nurse

## 2022-12-26 ENCOUNTER — Other Ambulatory Visit: Payer: Self-pay | Admitting: Pulmonary Disease

## 2022-12-26 ENCOUNTER — Other Ambulatory Visit: Payer: Self-pay | Admitting: Family

## 2022-12-26 DIAGNOSIS — J4541 Moderate persistent asthma with (acute) exacerbation: Secondary | ICD-10-CM

## 2022-12-26 DIAGNOSIS — J4540 Moderate persistent asthma, uncomplicated: Secondary | ICD-10-CM

## 2022-12-26 NOTE — Telephone Encounter (Signed)
Requesting: clonazepam 1mg  and ambien 10mg   Contract: 04/05/21 UDS: 06/29/21 Last Visit: 11/08/22 Next Visit: None Last Refill on clonazepam 11/29/22 #45 and 0RF Last Refill on ambien: 10/04/22 #30 and 0RF   Please Advise

## 2022-12-28 ENCOUNTER — Telehealth: Payer: Self-pay | Admitting: *Deleted

## 2022-12-28 LAB — TOXASSURE SELECT,+ANTIDEPR,UR

## 2022-12-28 NOTE — Telephone Encounter (Signed)
Urine drug screen for this encounter is consistent for prescribed medication 

## 2022-12-29 ENCOUNTER — Telehealth: Payer: Self-pay | Admitting: Registered Nurse

## 2022-12-29 ENCOUNTER — Encounter (HOSPITAL_BASED_OUTPATIENT_CLINIC_OR_DEPARTMENT_OTHER): Payer: Medicare HMO | Admitting: Physical Medicine & Rehabilitation

## 2022-12-29 ENCOUNTER — Encounter: Payer: Self-pay | Admitting: Physical Medicine & Rehabilitation

## 2022-12-29 VITALS — BP 104/72 | HR 73 | Ht 68.0 in | Wt 191.0 lb

## 2022-12-29 DIAGNOSIS — M47816 Spondylosis without myelopathy or radiculopathy, lumbar region: Secondary | ICD-10-CM

## 2022-12-29 DIAGNOSIS — M1612 Unilateral primary osteoarthritis, left hip: Secondary | ICD-10-CM | POA: Diagnosis not present

## 2022-12-29 DIAGNOSIS — Z5181 Encounter for therapeutic drug level monitoring: Secondary | ICD-10-CM | POA: Diagnosis not present

## 2022-12-29 DIAGNOSIS — M545 Low back pain, unspecified: Secondary | ICD-10-CM | POA: Diagnosis not present

## 2022-12-29 DIAGNOSIS — G8929 Other chronic pain: Secondary | ICD-10-CM | POA: Diagnosis not present

## 2022-12-29 DIAGNOSIS — M25552 Pain in left hip: Secondary | ICD-10-CM | POA: Diagnosis not present

## 2022-12-29 DIAGNOSIS — Z79891 Long term (current) use of opiate analgesic: Secondary | ICD-10-CM | POA: Diagnosis not present

## 2022-12-29 DIAGNOSIS — M961 Postlaminectomy syndrome, not elsewhere classified: Secondary | ICD-10-CM | POA: Diagnosis not present

## 2022-12-29 DIAGNOSIS — G894 Chronic pain syndrome: Secondary | ICD-10-CM | POA: Diagnosis not present

## 2022-12-29 MED ORDER — TRAMADOL HCL 50 MG PO TABS: 50.0000 mg | ORAL_TABLET | Freq: Two times a day (BID) | ORAL | 5 refills | Status: DC | PRN

## 2022-12-29 NOTE — Progress Notes (Signed)
Subjective:    Patient ID: Cody Raymond, male    DOB: 1970/12/17, 52 y.o.   MRN: [id number]  HPI 52 year old male with history of fibromyalgia, lumbar postlaminectomy syndrome as well as lumbar spondylosis who is complaining of increasing left greater than right's side buttock and groin pain.  He underwent x-ray of the left hip demonstrating mild degenerative changes consistent with osteoarthritis.  In addition the patient had lumbar x-rays which demonstrated lumbar spondylosis.  No new findings on the lumbar x-ray compared to previous films.      EXAM: MRI LUMBAR SPINE WITHOUT CONTRAST   TECHNIQUE: Multiplanar, multisequence MR imaging of the lumbar spine was performed. No intravenous contrast was administered.   COMPARISON:  CT Abdomen and Pelvis 11/21/2013.   FINDINGS: Segmentation:  Normal on the comparison.   Alignment: Stable vertebral height and alignment since 2015 with mild straightening of lower lumbar lordosis. Subtle retrolisthesis of L4 on L5 and L5 on S1.   Vertebrae: Small chronic L3 superior endplate Schmorl's node is stable since 2015. Normal bone marrow signal. No marrow edema or evidence of acute osseous abnormality. Intact visible sacrum and SI joints.   Conus medullaris and cauda equina: Conus extends to the T12-L1 level. Conus and cauda equina appear normal.   Paraspinal and other soft tissues: Mild postoperative changes to the posterior paraspinal soft tissues at L3 and L4. Otherwise negative.   Disc levels:   T11-T12: Negative.   T12-L1:  Negative.   L1-L2:  Negative.   L2-L3: Mild disc desiccation and circumferential, mostly far lateral disc bulging. Mild facet and ligament flavum hypertrophy. No significant stenosis.   L3-L4: Mild far lateral disc bulging and endplate spurring. Mild to moderate facet hypertrophy. No spinal or lateral recess stenosis. Mild bilateral L3 neural foraminal stenosis.   L4-L5: Disc desiccation with right  eccentric circumferential disc bulge. Broad-based right subarticular component of disc (series 6, image 30) with endplate spurring. Superimposed mild to moderate facet and ligament flavum hypertrophy. Borderline to mild spinal stenosis. Mild to moderate right lateral recess stenosis (descending right L5 nerve level). Mild to moderate right L4 neural foraminal stenosis. Borderline to mild left foraminal stenosis.   L5-S1: Disc desiccation. Small central posterior disc protrusion with possible annular fissure, but no mass effect on the thecal sac or lateral recesses. Mild facet hypertrophy. No stenosis.   IMPRESSION: 1. Rightward disc and endplate degeneration at L4-L5 with broad-based right subarticular component and superimposed moderate posterior element hypertrophy. Up to mild associated spinal stenosis, and moderate right neural foraminal and right lateral recess stenosis. Correlate for right L4 and/or L5 radiculitis. 2. Mild lumbar disc degeneration elsewhere with no other convincing neural impingement.     Electronically Signed   By: Odessa FlemingH  Hall M.D.   On: 10/30/2017 19:37  OMPARISON:  11/24/2014   FINDINGS: Stable mild right convex lumbar scoliosis. Normal alignment on lateral. Disc spaces are fairly well preserved. Mild degenerative changes with small marginal osteophytes. Moderate lower lumbar facet disease is stable. No acute bony findings. The visualized bony pelvis is intact. The SI joints appear normal.   IMPRESSION: 1. Stable mild right convex lumbar scoliosis and degenerative lumbar spondylosis. 2. No acute bony findings.     Electronically Signed   By: Rudie MeyerP.  Gallerani M.D.   On: 12/25/2022 08:48 Pain Inventory Average Pain 8 Pain Right Now 8 My pain is constant, sharp, burning, stabbing, tingling, and aching  In the last 24 hours, has pain interfered with the following? General activity 4 Relation  with others 5 Enjoyment of life 5 What TIME of day is your  pain at its worst? morning , daytime, evening, and night Sleep (in general) Good  Pain is worse with: walking, bending, sitting, inactivity, and standing Pain improves with: rest, heat/ice, medication, and injections Relief from Meds: 6  Family History  Problem Relation Age of Onset   Colon cancer Paternal Grandfather    Prostate cancer Paternal Grandfather    Aneurysm Father    Heart attack Father 17   Coronary artery disease Father    Colon polyps Mother    Heart disease Mother    Coronary artery disease Mother    Migraines Mother    Breast cancer Maternal Grandmother    Colon cancer Paternal Uncle        x 2   Stomach cancer Brother    Liver disease Other        unsure who it was   Allergies Daughter    Social History   Socioeconomic History   Marital status: Married    Spouse name: Not on file   Number of children: Not on file   Years of education: Not on file   Highest education level: Not on file  Occupational History   Not on file  Tobacco Use   Smoking status: Never   Smokeless tobacco: Never  Vaping Use   Vaping Use: Never used  Substance and Sexual Activity   Alcohol use: No   Drug use: No   Sexual activity: Yes  Other Topics Concern   Not on file  Social History Narrative   ** Merged History Encounter **       Holter monitor 08/2010: PVCs and sinus tachy.   Sleep Study (02/2008): mild sleep apnea, no indication for CPAP.   Social Determinants of Health   Financial Resource Strain: Medium Risk (12/06/2021)   Overall Financial Resource Strain (CARDIA)    Difficulty of Paying Living Expenses: Somewhat hard  Food Insecurity: Food Insecurity Present (12/12/2022)   Hunger Vital Sign    Worried About Running Out of Food in the Last Year: Often true    Ran Out of Food in the Last Year: Sometimes true  Transportation Needs: No Transportation Needs (12/12/2022)   PRAPARE - Administrator, Civil Service (Medical): No    Lack of Transportation  (Non-Medical): No  Physical Activity: Insufficiently Active (12/06/2021)   Exercise Vital Sign    Days of Exercise per Week: 3 days    Minutes of Exercise per Session: 20 min  Stress: Stress Concern Present (12/06/2021)   [employer] of Occupational Health - Occupational Stress Questionnaire    Feeling of Stress : To some extent  Social Connections: Moderately Integrated (12/06/2021)   Social Connection and Isolation Panel [NHANES]    Frequency of Communication with Friends and Family: More than three times a week    Frequency of Social Gatherings with Friends and Family: More than three times a week    Attends Religious Services: More than 4 times per year    Active Member of [employer] or Organizations: No    Attends Banker Meetings: Never    Marital Status: Married   Past Surgical History:  Procedure Laterality Date   APPENDECTOMY     BACK SURGERY     CARDIAC CATHETERIZATION  2007   Clean Cardiac Cath [facility] Cards), with RCA 30% narrowing likely due to catheter induced spasm in 09/02/10.   CARDIAC CATHETERIZATION  09/02/2010  mod. nonobstructive disease in the RCA and CX, tortuous LAD   COLONOSCOPY W/ POLYPECTOMY  03/17/11   diminutive polyp   LOOP RECORDER EXPLANT N/A 01/14/2014   Procedure: LOOP RECORDER EXPLANT;  Surgeon: Thurmon Fair, MD;  Location: [facility] CATH [facility];  Service: Cardiovascular;  Laterality: N/A;   LOOP RECORDER IMPLANT  04/06/2012   Reveal XT 4529   LOOP RECORDER IMPLANT N/A 04/06/2012   Procedure: LOOP RECORDER IMPLANT;  Surgeon: Thurmon Fair, MD;  Location: [facility] CATH [facility];  Service: Cardiovascular;  Laterality: N/A;   NM MYOCAR PERF WALL MOTION  01/25/2008   mild anteroapical wall ischemia   PATENT DUCTUS ARTERIOUS REPAIR     at age 59   Past Surgical History:  Procedure Laterality Date   APPENDECTOMY     BACK SURGERY     CARDIAC CATHETERIZATION  2007   Clean Cardiac Cath [facility] Cards), with RCA 30% narrowing likely due to catheter  induced spasm in 09/02/10.   CARDIAC CATHETERIZATION  09/02/2010   mod. nonobstructive disease in the RCA and CX, tortuous LAD   COLONOSCOPY W/ POLYPECTOMY  03/17/11   diminutive polyp   LOOP RECORDER EXPLANT N/A 01/14/2014   Procedure: LOOP RECORDER EXPLANT;  Surgeon: Thurmon Fair, MD;  Location: [facility]ATH [facility];  Service: Cardiovascular;  Laterality: N/A;   LOOP RECORDER IMPLANT  04/06/2012   Reveal XT 4529   LOOP RECORDER IMPLANT N/A 04/06/2012   Procedure: LOOP RECORDER IMPLANT;  Surgeon: Thurmon Fair, MD;  Location: [facility] CATH [facility];  Service: Cardiovascular;  Laterality: N/A;   NM MYOCAR PERF WALL MOTION  01/25/2008   mild anteroapical wall ischemia   PATENT DUCTUS ARTERIOUS REPAIR     at age 59   Past Medical History:  Diagnosis Date   Aneurysm    Asthma    Blood transfusion    CAD (coronary artery disease), non obstructive on cath 2011 04/05/2012   Coronary artery disease    Diabetes mellitus    Diabetes mellitus without complication    Enlarged prostate    GERD (gastroesophageal reflux disease)    H/O syncope    Heart disease    History of repair of patent ductus arteriosus 07/23/2016   Hyperlipidemia    Hypertension    Neuromuscular disorder    DJD   Refusal of blood transfusions as patient is Jehovah's Witness    There were no vitals taken for this visit.  Opioid Risk Score:   Fall Risk Score:  `1  Depression screen PHQ 2/9     12/21/2022    1:05 PM 12/12/2022    3:07 PM 07/22/2022    8:27 AM 12/28/2021   10:27 AM 12/06/2021   11:05 AM 06/29/2021    9:57 AM 06/22/2021    3:52 PM  Depression screen PHQ 2/9  Decreased Interest 0 1 0 2 0 1 2  Down, Depressed, Hopeless 0 1 0 2 1  2   PHQ - 2 Score 0 2 0 4 1 1 4   Altered sleeping    2   2  Tired, decreased energy    2   1  Change in appetite    1   1  Feeling bad or failure about yourself     1   0  Trouble concentrating    1   0  Moving slowly or fidgety/restless    0   1  Suicidal thoughts    0   0  PHQ-9 Score    11  9  Difficult doing work/chores       Somewhat difficult     Review of Systems  Musculoskeletal:  Positive for back pain.       B/L left pain LT anke pain  All other systems reviewed and are negative.     Objective:   Physical Exam Vitals and nursing note reviewed.  Constitutional:      Appearance: He is obese.  HENT:     Head: Normocephalic and atraumatic.  Eyes:     Extraocular Movements: Extraocular movements intact.     Conjunctiva/sclera: Conjunctivae normal.     Pupils: Pupils are equal, round, and reactive to light.  Musculoskeletal:     Right lower leg: No edema.     Left lower leg: No edema.  Skin:    General: Skin is warm and dry.  Neurological:     Mental Status: He is alert and oriented to person, place, and time.     Gait: Gait abnormal.    Pain with lumbar extension   Ambulates with a cane no evidence of toe drag or knee instability Negative straight leg raising bilaterally Faber's test is negative bilaterally thigh thrust positive on the left negative on right Prone compression positive bilaterally Lateral compression essentially negative does have some left trochanteric bursa discomfort Negative Faber's bilaterally Negative straight leg raise Negative femoral stretch test bilaterally Motor strength is 5/5 bilateral hip flexor knee extensor ankle dorsiflexor. Sensation intact to light touch bilaterally. Tone is normal bilaterally in the lower extremities    Assessment & Plan:   #1.  Worsening of chronic lumbar pain left greater than right side.  No new focal neurologic findings.  No clear-cut signs of sacroiliac disorder He does have limited lumbar range of motion as well as pain with lateral bending and extension more so than with flexion.  This would be consistent with his lumbar spondylosis. Will make referral to outpatient PT Will see him back in 1 month and decide whether or not he needs repeat lumbar MRI at that time. Continue tramadol 50 mg  twice daily Continue duloxetine 30 mg/day No signs of serotonin syndrome this clinic could take over his duloxetine prescription if PCP no longer comfortable filling this.

## 2022-12-29 NOTE — Telephone Encounter (Signed)
Spoke with Dr Wynn Banker this morning regarding PCP concern about Serotonin Syndrome.  We will continue Tramadol. Call placed to Cody Raymond, regarding the above He has a scheduled appointment with Dr Wynn Banker today.  If PCP won't prescribe his Cymbalta we will prescribe it for him and he will be educated on the Serotonin Syndrome.  Medication List was Reviewed.  He has been on Cymbalta since 2020 and Tramadol since 2022 with no adverse reactions.

## 2022-12-29 NOTE — Patient Instructions (Signed)
OK to take both cymbalta and tramadol, no signs of serotonin syndrome   Will try PT first and then decide on further treatment based on next visit

## 2023-01-11 ENCOUNTER — Other Ambulatory Visit: Payer: Self-pay | Admitting: Pulmonary Disease

## 2023-01-11 DIAGNOSIS — J4541 Moderate persistent asthma with (acute) exacerbation: Secondary | ICD-10-CM

## 2023-01-26 ENCOUNTER — Encounter: Payer: Medicare HMO | Admitting: Physical Medicine & Rehabilitation

## 2023-01-26 ENCOUNTER — Encounter: Payer: Self-pay | Admitting: *Deleted

## 2023-01-26 NOTE — Progress Notes (Signed)
Cardiology Clinic Note   Patient Name: Cody Raymond Date of Encounter: 01/27/2023  Primary Care Provider:  Sandford Craze, NP Primary Cardiologist:  Nanetta Batty, MD  Patient Profile    For 52-year-old male with past medical history of hypertension, hyperlipidemia, type 2 diabetes, coronary artery disease, OSA, asthma, anxiety and depression.  Was seen last by cardiology on consultation by Dr.Kardie Tobb, on 06/02/2022 in the setting of recurrent chest pain which was worsening in frequency and duration.  Cardiac catheterization from 2011 revealed nonobstructive CAD of left circumflex and RCA, LVEF 65%.  He had a recurrent atypical chest pain in the past and underwent multiple ischemic work-up. Stress Myoview from 11/12/2014 was low risk, negative for ischemia, a small, moderate intensity, fixed distal anterior/apical defect suggests of prominent thinning versus soft tissue attenuation.  Coronary CT 02/26/2019 showed calcium score of 0, normal coronary arteries.  Stress Myoview at [facility] from 04/08/2020 was normal without ischemia.   Was diagnosed with atypical chest discomfort.  With no further cardiac workup recommended.  It was felt to be related to anxiety and stress.  He was to follow-up with PCP and consider psychotherapy.  Is also being followed by PCP for chronic lumbar pain left greater than right and has been referred to physical therapy, and nonnarcotic pain control.  Past Medical History    Past Medical History:  Diagnosis Date   Aneurysm (HCC)    Asthma    Blood transfusion    CAD (coronary artery disease), non obstructive on cath 2011 04/05/2012   Coronary artery disease    Diabetes mellitus    Diabetes mellitus without complication (HCC)    Enlarged prostate    GERD (gastroesophageal reflux disease)    H/O syncope    Heart disease    History of repair of patent ductus arteriosus 07/23/2016   Hyperlipidemia    Hypertension    Neuromuscular disorder (HCC)     DJD   Refusal of blood transfusions as patient is Jehovah's Witness    Past Surgical History:  Procedure Laterality Date   APPENDECTOMY     BACK SURGERY     CARDIAC CATHETERIZATION  2007   Clean Cardiac Cath [facility]), with RCA 30% narrowing likely due to catheter induced spasm in 09/02/10.   CARDIAC CATHETERIZATION  09/02/2010   mod. nonobstructive disease in the RCA and CX, tortuous LAD   COLONOSCOPY W/ POLYPECTOMY  03/17/11   diminutive polyp   LOOP RECORDER EXPLANT N/A 01/14/2014   Procedure: LOOP RECORDER EXPLANT;  Surgeon: Thurmon Fair, MD;  Location: [facility] CATH LAB;  Service: Cardiovascular;  Laterality: N/A;   LOOP RECORDER IMPLANT  04/06/2012   Reveal XT [id number]   LOOP RECORDER IMPLANT N/A 04/06/2012   Procedure: LOOP RECORDER IMPLANT;  Surgeon: Thurmon Fair, MD;  Location: [facility] CATH LAB;  Service: Cardiovascular;  Laterality: N/A;   NM MYOCAR PERF WALL MOTION  01/25/2008   mild anteroapical wall ischemia   PATENT DUCTUS ARTERIOUS REPAIR     at age 52    Allergies  Allergies  Allergen Reactions   Diphenhydramine     Other reaction(s): confusion   Benadryl [Diphenhydramine Hcl]     "drives me nuts"    History of Present Illness    Cody Raymond comes today without any new complaints.  He continues to tolerate metoprolol for SVT and is felt much better after taking it.  He has rare PVCs or palpitations at this time.  He does have some on occasion in the  evening before going to bed.  He is under a lot of stress having his daughter and grandchildren living with him now so he sometimes will leave and spend time alone at a [employer].  He is completely off of caffeine, alcohol, and has been trying to exercise more to reduce stress.  He denies any new cardiac symptoms.  He does complain of left hip pain and right knee pain and has been seen in the ED for same after falling by tripping.  The patient is being seen by PCP for ongoing management.  No fractures were seen on x-rays or  dislocations.  Home Medications    Current Outpatient Medications  Medication Sig Dispense Refill   Accu-Chek Softclix Lancets lancets TEST BLOOD SUGAR TWICE DAILY AS DIRECTED 200 each 10   albuterol (PROVENTIL) (2.5 MG/3ML) 0.083% nebulizer solution      Ascorbic Acid (VITAMIN C) 1000 MG tablet Take 1,000 mg by mouth daily.     aspirin EC 81 MG EC tablet Take 1 tablet (81 mg total) by mouth daily. 30 tablet 0   atorvastatin (LIPITOR) 80 MG tablet TAKE 1 TABLET EVERY DAY 90 tablet 1   betamethasone dipropionate (DIPROLENE) 0.05 % cream Apply topically 2 (two) times daily. 30 g 0   Blood Glucose Monitoring Suppl (ACCU-CHEK GUIDE) w/Device KIT USE AS DIRECTED 1 kit 10   cetirizine (ZYRTEC) 10 MG tablet Take 10 mg by mouth daily.     clonazePAM (KLONOPIN) 1 MG tablet TAKE 1/2 TABLET EVERY MORNING AND TAKE 1 TABLET EVERY EVENING 45 tablet 0   DULoxetine (CYMBALTA) 30 MG capsule TAKE 1 CAPSULE EVERY DAY 90 capsule 1   DULoxetine (CYMBALTA) 60 MG capsule Take 1 capsule (60 mg total) by mouth daily. 90 capsule 1   ezetimibe (ZETIA) 10 MG tablet Take 1 tablet (10 mg total) by mouth daily. 90 tablet 0   fluticasone (FLONASE) 50 MCG/ACT nasal spray USE 2 SPRAYS IN EACH NOSTRIL ONE TIME DAILY 48 g 3   fluticasone-salmeterol (WIXELA INHUB) 250-50 MCG/ACT AEPB Inhale 1 puff into the lungs in the morning and at bedtime. 60 each 6   gabapentin (NEURONTIN) 300 MG capsule TAKE 1 CAPSULE AT 6AM, 11:30AM, 4:30PM AND TAKE 3 CAPSULES AT 10PM 540 capsule 3   glucose blood (ONETOUCH VERIO) test strip TEST BLOOD SUGAR THREE TIMES DAILY 300 strip 12   ibuprofen (IBU) 800 MG tablet      Icosapent Ethyl (VASCEPA) 1 g CAPS Take 2 capsules (2 g total) by mouth 2 (two) times daily. 120 capsule 0   ipratropium (ATROVENT) 0.03 % nasal spray PLACE 2 SPRAYS INTO BOTH NOSTRILS EVERY 12 (TWELVE) HOURS. 60 mL 0   ipratropium-albuterol (DUONEB) 0.5-2.5 (3) MG/3ML SOLN INHALE 1 VIAL VIA NEBULIZATION EVERY 6 HOURS AS NEEDED 360 mL  1   isosorbide mononitrate (IMDUR) 30 MG 24 hr tablet Take by mouth.     ketoconazole (NIZORAL) 2 % shampoo Apply topically 2 (two) times a week. 360 mL 1   meloxicam (MOBIC) 7.5 MG tablet Take 1 tablet (7.5 mg total) by mouth daily as needed for pain. 90 tablet 0   metFORMIN (GLUCOPHAGE-XR) 500 MG 24 hr tablet TAKE 2 TABLETS EVERY DAY WITH BREAKFAST 180 tablet 1   montelukast (SINGULAIR) 10 MG tablet Take 1 tablet (10 mg total) by mouth at bedtime. 90 tablet 0   Multiple Vitamin (MULTIVITAMIN WITH MINERALS) TABS tablet Take 1 tablet by mouth daily.     pantoprazole (PROTONIX) 40 MG tablet TAKE 1  TABLET EVERY DAY 90 tablet 3   sucralfate (CARAFATE) 1 g tablet Take 1 tablet (1 g total) by mouth in the morning, at noon, and at bedtime. 21 tablet 0   tamsulosin (FLOMAX) 0.4 MG CAPS capsule TAKE 1 CAPSULE EVERY DAY 90 capsule 1   tiZANidine (ZANAFLEX) 2 MG tablet TAKE 1 TABLET EVERY 6 HOURS AS NEEDED FOR MUSCLE SPASM(S) 360 tablet 3   traMADol (ULTRAM) 50 MG tablet Take 1 tablet (50 mg total) by mouth 2 (two) times daily as needed. 60 tablet 5   traZODone (DESYREL) 100 MG tablet TAKE 1 TABLET AT BEDTIME 90 tablet 1   zolpidem (AMBIEN) 10 MG tablet TAKE 1 TABLET AT BEDTIME AS NEEDED FOR SLEEP 30 tablet 0   lisinopril (ZESTRIL) 10 MG tablet Take 0.5 tablets (5 mg total) by mouth daily. 45 tablet 3   Metoprolol Tartrate 75 MG TABS TAKE 1 TABLET TWICE DAILY 180 tablet 1   nitroGLYCERIN (NITROSTAT) 0.4 MG SL tablet Place 1 tablet (0.4 mg total) under the tongue every 5 (five) minutes as needed for chest pain. 25 tablet 3   No current facility-administered medications for this visit.     Family History    Family History  Problem Relation Age of Onset   Colon cancer Paternal Grandfather    Prostate cancer Paternal Grandfather    Aneurysm Father    Heart attack Father 40   Coronary artery disease Father    Colon polyps Mother    Heart disease Mother    Coronary artery disease Mother    Migraines  Mother    Breast cancer Maternal Grandmother    Colon cancer Paternal Uncle        x 2   Stomach cancer Brother    Liver disease Other        unsure who it was   Allergies Daughter    He indicated that his mother is alive. He indicated that his father is deceased. He indicated that the status of his brother is unknown. He indicated that his maternal grandmother is deceased. He indicated that his maternal grandfather is deceased. He indicated that his paternal grandmother is deceased. He indicated that his paternal grandfather is deceased. He indicated that the status of his daughter is unknown. He indicated that the status of his paternal uncle is unknown. He indicated that the status of his other is unknown.  Social History    Social History   Socioeconomic History   Marital status: Married    Spouse name: Not on file   Number of children: Not on file   Years of education: Not on file   Highest education level: Not on file  Occupational History   Not on file  Tobacco Use   Smoking status: Never   Smokeless tobacco: Never  Vaping Use   Vaping Use: Never used  Substance and Sexual Activity   Alcohol use: No   Drug use: No   Sexual activity: Yes  Other Topics Concern   Not on file  Social History Narrative   ** Merged History Encounter **       Holter monitor 08/2010: PVCs and sinus tachy.   Sleep Study (02/2008): mild sleep apnea, no indication for CPAP.   Social Determinants of Health   Financial Resource Strain: Medium Risk (12/06/2021)   Overall Financial Resource Strain (CARDIA)    Difficulty of Paying Living Expenses: Somewhat hard  Food Insecurity: Food Insecurity Present (12/12/2022)   Hunger Vital Sign  Worried About Programme researcher, broadcasting/film/video in the Last Year: Often true    Ran Out of Food in the Last Year: Sometimes true  Transportation Needs: No Transportation Needs (12/12/2022)   PRAPARE - Administrator, Civil Service (Medical): No    Lack of  Transportation (Non-Medical): No  Physical Activity: Insufficiently Active (12/06/2021)   Exercise Vital Sign    Days of Exercise per Week: 3 days    Minutes of Exercise per Session: 20 min  Stress: Stress Concern Present (12/06/2021)   [employer] of Occupational Health - Occupational Stress Questionnaire    Feeling of Stress : To some extent  Social Connections: Moderately Integrated (12/06/2021)   Social Connection and Isolation Panel [NHANES]    Frequency of Communication with Friends and Family: More than three times a week    Frequency of Social Gatherings with Friends and Family: More than three times a week    Attends Religious Services: More than 4 times per year    Active Member of [employer] or Organizations: No    Attends Banker Meetings: Never    Marital Status: Married  Catering manager Violence: Not At Risk (12/12/2022)   Humiliation, Afraid, Rape, and Kick questionnaire    Fear of Current or Ex-Partner: No    Emotionally Abused: No    Physically Abused: No    Sexually Abused: No     Review of Systems    General:  No chills, fever, night sweats or weight changes.  Cardiovascular:  No chest pain, dyspnea on exertion, edema, orthopnea, palpitations, paroxysmal nocturnal dyspnea. Dermatological: No rash, lesions/masses Respiratory: No cough, dyspnea Urologic: No hematuria, dysuria Abdominal:   No nausea, vomiting, diarrhea, bright red blood per rectum, melena, or hematemesis Neurologic:  No visual changes, wkns, changes in mental status. All other systems reviewed and are otherwise negative except as noted above.     Physical Exam    VS:  BP 126/76   Pulse 83   Ht 5\' 8"  (1.727 m)   Wt 194 lb 9.6 oz (88.3 kg)   SpO2 98%   BMI 29.59 kg/m  , BMI Body mass index is 29.59 kg/m.     GEN: Well nourished, well developed, in no acute distress. HEENT: normal. Neck: Supple, no JVD, carotid bruits, or masses. Cardiac: RRR, no murmurs, rubs, or gallops. No  clubbing, cyanosis, edema.  Radials/DP/PT 2+ and equal bilaterally.  Respiratory:  Respirations regular and unlabored, clear to auscultation bilaterally. GI: Soft, nontender, nondistended, BS + x 4. MS: no deformity or atrophy. Skin: warm and dry, no rash. Neuro:  Strength and sensation are intact. Psych: Normal affect.  Accessory Clinical Findings    ECG personally reviewed by me today-.completed the office visit  Lab Results  Component Value Date   WBC 6.8 06/02/2022   HGB 13.3 06/02/2022   HCT 39.7 06/02/2022   MCV 81.0 06/02/2022   PLT 272 06/02/2022   Lab Results  Component Value Date   CREATININE 0.78 06/02/2022   BUN 15 06/02/2022   NA 137 06/02/2022   K 4.1 06/02/2022   CL 105 06/02/2022   CO2 25 06/02/2022   Lab Results  Component Value Date   ALT 21 06/02/2022   AST 26 06/02/2022   ALKPHOS 85 06/02/2022   BILITOT 1.0 06/02/2022   Lab Results  Component Value Date   CHOL 97 12/28/2021   HDL 40.90 12/28/2021   LDLCALC 33 12/28/2021   LDLDIRECT 36 04/19/2019   TRIG  113.0 12/28/2021   CHOLHDL 2 12/28/2021    Lab Results  Component Value Date   HGBA1C 6.2 05/11/2022    Review of Prior Studies:  Coronary CTA 02/26/2019 ADDENDUM REPORT: 02/26/2019 12:40   CLINICAL DATA:  Chest pain   EXAM: Cardiac CTA   MEDICATIONS: Sub lingual nitro. 4 mg and lopressor 0mg    TECHNIQUE: The patient was scanned on a [employer] 192 scanner. Gantry rotation speed was 250 msecs. Collimation was. 6 mm . A 120 kV prospective scan was triggered in the ascending thoracic aorta at 140 HU's with full mA between 30-70% of the R-R interval . Average HR during the scan was 68 bpm. The 3D data set was interpreted on a dedicated work station using MPR, MIP and VRT modes. A total of 80 cc of contrast was used.   FINDINGS: Non-cardiac: See separate report from [facility]. No significant findings on limited lung and soft tissue windows.   Calcium score: No  calcium noted   Coronary Arteries: Right dominant with no anomalies   LM: Normal   LAD: Normal   D1: Normal   D2: Normal   D3: Normal   Circumflex: Normal   OM1: Normal   OM2: Normal   RCA: Normal   PDA: Normal   PLA: Normal   Normal aortic root 2.6 cm. Normal arch vessels. Area of calcification noted at the previous PDA sight repair. No coarctation or residual aneurysm No residual communication with PA   Normal MPA diameter 2.5 cm Normal LPA 2.0 cm and RPA 2.1 cm   IMPRESSION: 1. Calcium Score 0   2.  Normal right dominant coronary arteries   3. Previous PDA repair with no residual communication or aneurysm. Normal aortic root 2.6 cm    Assessment & Plan   1.  PSVT: Completely controlled with metoprolol and is essentially asymptomatic.  Only has rare symptoms occasionally at nighttime before going to bed.  Will give him refills on metoprolol tartrate 75 mg twice daily.  90-day supply with 3 refills.  2.  Hypertension: Blood pressure is well-controlled currently.  Labs are being followed by PCP.  Continue lisinopril 5 mg daily (one half of a 10 mg tablet).  He also remains on isosorbide mononitrate 30 mg daily.  3.  Hypercholesterolemia: Goal of LDL less than 100.  He remains on atorvastatin 80 mg daily.  PCP is managing labs.         Signed, Bettey Mare. Liborio Nixon, ANP, AACC   01/27/2023 4:33 PM      Office [phone] Fax [phone]  Notice: This dictation was prepared with Dragon dictation along with smaller phrase technology. Any transcriptional errors that result from this process are unintentional and may not be corrected upon review.

## 2023-01-26 NOTE — Telephone Encounter (Signed)
Opened in error

## 2023-01-27 ENCOUNTER — Ambulatory Visit: Payer: Medicare HMO | Attending: Adult Health | Admitting: Adult Health

## 2023-01-27 ENCOUNTER — Encounter: Payer: Self-pay | Admitting: Adult Health

## 2023-01-27 VITALS — BP 126/76 | HR 83 | Ht 68.0 in | Wt 194.6 lb

## 2023-01-27 DIAGNOSIS — I471 Supraventricular tachycardia, unspecified: Secondary | ICD-10-CM

## 2023-01-27 DIAGNOSIS — I10 Essential (primary) hypertension: Secondary | ICD-10-CM

## 2023-01-27 DIAGNOSIS — E7800 Pure hypercholesterolemia, unspecified: Secondary | ICD-10-CM

## 2023-01-27 MED ORDER — METOPROLOL TARTRATE 75 MG PO TABS: ORAL_TABLET | ORAL | 1 refills | Status: DC

## 2023-01-27 MED ORDER — LISINOPRIL 10 MG PO TABS: 5.0000 mg | ORAL_TABLET | Freq: Every day | ORAL | 3 refills | Status: DC

## 2023-01-27 MED ORDER — NITROGLYCERIN 0.4 MG SL SUBL: 0.4000 mg | SUBLINGUAL_TABLET | SUBLINGUAL | 3 refills | Status: DC | PRN

## 2023-01-27 NOTE — Patient Instructions (Signed)
Medication Instructions:  No Changes *If you need a refill on your cardiac medications before your next appointment, please call your pharmacy*   Lab Work: No labs If you have labs (blood work) drawn today and your tests are completely normal, you will receive your results only by: MyChart Message (if you have MyChart) OR A paper copy in the mail If you have any lab test that is abnormal or we need to change your treatment, we will call you to review the results.   Testing/Procedures: No Testing   Follow-Up: At [facility], you and your health needs are our priority.  As part of our continuing mission to provide you with exceptional heart care, we have created designated Provider Care Teams.  These Care Teams include your primary Cardiologist (physician) and Advanced Practice Providers (APPs -  Physician Assistants and Nurse Practitioners) who all work together to provide you with the care you need, when you need it.  We recommend signing up for the patient portal called "MyChart".  Sign up information is provided on this After Visit Summary.  MyChart is used to connect with patients for Virtual Visits (Telemedicine).  Patients are able to view lab/test results, encounter notes, upcoming appointments, etc.  Non-urgent messages can be sent to your provider as well.   To learn more about what you can do with MyChart, go to [email or web address].    Your next appointment:   1 year(s)  Provider:   Nanetta Batty, MD

## 2023-02-01 ENCOUNTER — Other Ambulatory Visit: Payer: Self-pay | Admitting: Family

## 2023-02-01 NOTE — Telephone Encounter (Signed)
Requesting: Ambien 10mg  and clonazepam 1mg   Contract: 08/21/2019 UDS: 12/22/22 Last Visit: 11/08/22 Next Visit: None Last Refill on Ambien 12/26/22 #30 and 0RF On: Clonazepam: 12/26/22 #45 and 0RF   Please Advise

## 2023-02-01 NOTE — Telephone Encounter (Signed)
Please contact pt to schedule follow up visit.

## 2023-02-02 NOTE — Telephone Encounter (Signed)
Lvm2 sched  

## 2023-02-14 ENCOUNTER — Other Ambulatory Visit: Payer: Self-pay | Admitting: Family

## 2023-02-15 NOTE — Telephone Encounter (Signed)
Please contact pt to schedule follow up.  

## 2023-02-16 ENCOUNTER — Other Ambulatory Visit: Payer: Self-pay | Admitting: Family

## 2023-02-16 DIAGNOSIS — M545 Low back pain, unspecified: Secondary | ICD-10-CM

## 2023-02-16 NOTE — Telephone Encounter (Signed)
Pt sched

## 2023-02-17 ENCOUNTER — Encounter: Payer: Medicare HMO | Attending: Physical Medicine & Rehabilitation | Admitting: Physical Medicine & Rehabilitation

## 2023-02-17 ENCOUNTER — Encounter: Payer: Self-pay | Admitting: Physical Medicine & Rehabilitation

## 2023-02-17 VITALS — BP 104/71 | HR 75 | Ht 68.0 in | Wt 197.0 lb

## 2023-02-17 DIAGNOSIS — M1612 Unilateral primary osteoarthritis, left hip: Secondary | ICD-10-CM | POA: Diagnosis not present

## 2023-02-17 DIAGNOSIS — M7062 Trochanteric bursitis, left hip: Secondary | ICD-10-CM | POA: Diagnosis not present

## 2023-02-17 DIAGNOSIS — M47816 Spondylosis without myelopathy or radiculopathy, lumbar region: Secondary | ICD-10-CM | POA: Diagnosis not present

## 2023-02-17 NOTE — Progress Notes (Signed)
Subjective:    Patient ID: Cody Raymond, male    DOB: 05/03/1971, 52 y.o.   MRN: [id number]  HPI 52 year old male with chronic multifactorial pain including lumbar spondylosis, lumbar postlaminectomy syndrome, fibromyalgia syndrome who returns to reevaluate left hip pain.  He has been diagnosed with trochanteric bursitis in the past and had a right troches bursa injection under ultrasound guidance on 10/17/2016. X-rays performed last month showed mild degenerative changes left hip.  The patient states that at 1 point he did have groin pain but now his pain is mainly posterior hip.  He does have a history of a fall onto a glass table which left him with a soft tissue defect in the posterior thigh on the left side. He has no numbness or tingling in the lower extremities no new weakness.  Overall symptoms have subsided.  Pain Inventory Average Pain 8 Pain Right Now 8 My pain is constant, sharp, burning, dull, stabbing, tingling, and aching  In the last 24 hours, has pain interfered with the following? General activity 4 Relation with others 4 Enjoyment of life 5 What TIME of day is your pain at its worst? morning , daytime, evening, and night Sleep (in general) Fair  Pain is worse with: walking, bending, sitting, inactivity, and standing Pain improves with: rest, heat/ice, and medication Relief from Meds: 5  Family History  Problem Relation Age of Onset   Colon cancer Paternal Grandfather    Prostate cancer Paternal Grandfather    Aneurysm Father    Heart attack Father 11   Coronary artery disease Father    Colon polyps Mother    Heart disease Mother    Coronary artery disease Mother    Migraines Mother    Breast cancer Maternal Grandmother    Colon cancer Paternal Uncle        x 2   Stomach cancer Brother    Liver disease Other        unsure who it was   Allergies Daughter    Social History   Socioeconomic History   Marital status: Married    Spouse name: Not on  file   Number of children: Not on file   Years of education: Not on file   Highest education level: Not on file  Occupational History   Not on file  Tobacco Use   Smoking status: Never   Smokeless tobacco: Never  Vaping Use   Vaping Use: Never used  Substance and Sexual Activity   Alcohol use: No   Drug use: No   Sexual activity: Yes  Other Topics Concern   Not on file  Social History Narrative   ** Merged History Encounter **       Holter monitor 08/2010: PVCs and sinus tachy.   Sleep Study (02/2008): mild sleep apnea, no indication for CPAP.   Social Determinants of Health   Financial Resource Strain: Medium Risk (12/06/2021)   Overall Financial Resource Strain (CARDIA)    Difficulty of Paying Living Expenses: Somewhat hard  Food Insecurity: Food Insecurity Present (12/12/2022)   Hunger Vital Sign    Worried About Running Out of Food in the Last Year: Often true    Ran Out of Food in the Last Year: Sometimes true  Transportation Needs: No Transportation Needs (12/12/2022)   PRAPARE - Administrator, Civil Service (Medical): No    Lack of Transportation (Non-Medical): No  Physical Activity: Insufficiently Active (12/06/2021)   Exercise Vital Sign    Days  of Exercise per Week: 3 days    Minutes of Exercise per Session: 20 min  Stress: Stress Concern Present (12/06/2021)   [employer] of Occupational Health - Occupational Stress Questionnaire    Feeling of Stress : To some extent  Social Connections: Moderately Integrated (12/06/2021)   Social Connection and Isolation Panel [NHANES]    Frequency of Communication with Friends and Family: More than three times a week    Frequency of Social Gatherings with Friends and Family: More than three times a week    Attends Religious Services: More than 4 times per year    Active Member of [employer] or Organizations: No    Attends Banker Meetings: Never    Marital Status: Married   Past Surgical History:   Procedure Laterality Date   APPENDECTOMY     BACK SURGERY     CARDIAC CATHETERIZATION  2007   Clean Cardiac Cath [facility] Cards), with RCA 30% narrowing likely due to catheter induced spasm in 09/02/10.   CARDIAC CATHETERIZATION  09/02/2010   mod. nonobstructive disease in the RCA and CX, tortuous LAD   COLONOSCOPY W/ POLYPECTOMY  03/17/11   diminutive polyp   LOOP RECORDER EXPLANT N/A 01/14/2014   Procedure: LOOP RECORDER EXPLANT;  Surgeon: Thurmon Fair, MD;  Location: [facility] CATH [facility];  Service: Cardiovascular;  Laterality: N/A;   LOOP RECORDER IMPLANT  04/06/2012   Reveal XT 4529   LOOP RECORDER IMPLANT N/A 04/06/2012   Procedure: LOOP RECORDER IMPLANT;  Surgeon: Thurmon Fair, MD;  Location: [facility] CATH [facility];  Service: Cardiovascular;  Laterality: N/A;   NM MYOCAR PERF WALL MOTION  01/25/2008   mild anteroapical wall ischemia   PATENT DUCTUS ARTERIOUS REPAIR     at age 89   Past Surgical History:  Procedure Laterality Date   APPENDECTOMY     BACK SURGERY     CARDIAC CATHETERIZATION  2007   Clean Cardiac Cath [facility] Cards), with RCA 30% narrowing likely due to catheter induced spasm in 09/02/10.   CARDIAC CATHETERIZATION  09/02/2010   mod. nonobstructive disease in the RCA and CX, tortuous LAD   COLONOSCOPY W/ POLYPECTOMY  03/17/11   diminutive polyp   LOOP RECORDER EXPLANT N/A 01/14/2014   Procedure: LOOP RECORDER EXPLANT;  Surgeon: Thurmon Fair, MD;  Location: [facility];  Service: Cardiovascular;  Laterality: N/A;   LOOP RECORDER IMPLANT  04/06/2012   Reveal XT 4529   LOOP RECORDER IMPLANT N/A 04/06/2012   Procedure: LOOP RECORDER IMPLANT;  Surgeon: Thurmon Fair, MD;  Location: [facility]ATH [facility];  Service: Cardiovascular;  Laterality: N/A;   NM MYOCAR PERF WALL MOTION  01/25/2008   mild anteroapical wall ischemia   PATENT DUCTUS ARTERIOUS REPAIR     at age 89   Past Medical History:  Diagnosis Date   Aneurysm (HCC)    Asthma    Blood transfusion    CAD (coronary artery  disease), non obstructive on cath 2011 04/05/2012   Coronary artery disease    Diabetes mellitus    Diabetes mellitus without complication (HCC)    Enlarged prostate    GERD (gastroesophageal reflux disease)    H/O syncope    Heart disease    History of repair of patent ductus arteriosus 07/23/2016   Hyperlipidemia    Hypertension    Neuromuscular disorder (HCC)    DJD   Refusal of blood transfusions as patient is Jehovah's Witness    There were no vitals taken for this visit.  Opioid Risk  Score:   Fall Risk Score:  `1  Depression screen PHQ 2/9     12/29/2022    2:29 PM 12/21/2022    1:05 PM 12/12/2022    3:07 PM 07/22/2022    8:27 AM 12/28/2021   10:27 AM 12/06/2021   11:05 AM 06/29/2021    9:57 AM  Depression screen PHQ 2/9  Decreased Interest 2 0 1 0 2 0 1  Down, Depressed, Hopeless 1 0 1 0 2 1   PHQ - 2 Score 3 0 2 0 4 1 1   Altered sleeping     2    Tired, decreased energy     2    Change in appetite     1    Feeling bad or failure about yourself      1    Trouble concentrating     1    Moving slowly or fidgety/restless     0    Suicidal thoughts     0    PHQ-9 Score     11      Review of Systems  Musculoskeletal:  Positive for gait problem.       Right leg pain from hip down to foot & left hip down to left knee  All other systems reviewed and are negative.      Objective:   Physical Exam Range of motion limited to approximately 50% flexion and extension as well as lateral bending.  He has more pain when he bends toward the left side and this is in his flank and upper gluteal region. Negative straight leg raising bilaterally Motor strength is 5/5 bilateral hip flexor knee extensor ankle dorsiflexor Ambulates with a cane no evidence of toe drag or knee instability Mood and affect appropriate There is mild tenderness palpation lumbar paraspinal area bilaterally from L4-S1.  No tenderness in the gluteal region or the PSIS region.  There is tenderness over bilateral  greater trochanters.  Patient has normal hip range of motion has pain with internal rotation but the pain is in the posterior hip area rather than in the groin.       Assessment & Plan:   #1.  Chronic low back pain lumbar spondylosis as well as lumbar postlaminectomy syndrome no new radicular findings. 2.  Left posterior hip pain as well as lateral hip pain likely trochanteric bursitis may have some myofascial pain as well.  He is planning to go to therapy although he was not able to keep his appointment due to some family legal issues.  I do not think any further imaging is needed at this time.  May consider stroke versus  injections if therapy is not helpful.  He is doing some home exercises which she feels are helping.

## 2023-03-02 ENCOUNTER — Other Ambulatory Visit: Payer: Self-pay | Admitting: Family

## 2023-03-02 NOTE — Telephone Encounter (Signed)
Requesting: clonazepam 1mg  and Ambien 10mg   Contract: 08/21/2019 UDS: 12/22/22 Last Visit: 11/08/22 Next Visit: 03/10/23 Last Refill on clonazepam: 02/01/23 #45 and 0RF Last Refill on Ambien: 02/01/23 #30 and 0RF   Please Advise

## 2023-03-04 ENCOUNTER — Other Ambulatory Visit: Payer: Self-pay | Admitting: Family

## 2023-03-04 DIAGNOSIS — F418 Other specified anxiety disorders: Secondary | ICD-10-CM

## 2023-03-10 ENCOUNTER — Ambulatory Visit (HOSPITAL_BASED_OUTPATIENT_CLINIC_OR_DEPARTMENT_OTHER)
Admission: RE | Admit: 2023-03-10 | Discharge: 2023-03-10 | Disposition: A | Discharge status: Home / Self Care | Payer: Medicare HMO | Source: Ambulatory Visit | Attending: Family | Admitting: Family

## 2023-03-10 ENCOUNTER — Other Ambulatory Visit (HOSPITAL_BASED_OUTPATIENT_CLINIC_OR_DEPARTMENT_OTHER): Payer: Self-pay

## 2023-03-10 ENCOUNTER — Ambulatory Visit (INDEPENDENT_AMBULATORY_CARE_PROVIDER_SITE_OTHER): Payer: Medicare HMO | Admitting: Family

## 2023-03-10 VITALS — BP 104/70 | HR 69 | Temp 98.1°F | Resp 16 | Wt 196.0 lb

## 2023-03-10 DIAGNOSIS — J4540 Moderate persistent asthma, uncomplicated: Secondary | ICD-10-CM

## 2023-03-10 DIAGNOSIS — Z7984 Long term (current) use of oral hypoglycemic drugs: Secondary | ICD-10-CM | POA: Diagnosis not present

## 2023-03-10 DIAGNOSIS — E785 Hyperlipidemia, unspecified: Secondary | ICD-10-CM

## 2023-03-10 DIAGNOSIS — R0781 Pleurodynia: Secondary | ICD-10-CM | POA: Diagnosis not present

## 2023-03-10 DIAGNOSIS — F418 Other specified anxiety disorders: Secondary | ICD-10-CM | POA: Diagnosis not present

## 2023-03-10 DIAGNOSIS — G63 Polyneuropathy in diseases classified elsewhere: Secondary | ICD-10-CM

## 2023-03-10 DIAGNOSIS — E1142 Type 2 diabetes mellitus with diabetic polyneuropathy: Secondary | ICD-10-CM

## 2023-03-10 DIAGNOSIS — R0789 Other chest pain: Secondary | ICD-10-CM | POA: Insufficient documentation

## 2023-03-10 DIAGNOSIS — K219 Gastro-esophageal reflux disease without esophagitis: Secondary | ICD-10-CM

## 2023-03-10 DIAGNOSIS — Z79899 Other long term (current) drug therapy: Secondary | ICD-10-CM | POA: Diagnosis not present

## 2023-03-10 LAB — COMPREHENSIVE METABOLIC PANEL
BUN: 21 mg/dL (ref 7–25)
Creat: 0.84 mg/dL (ref 0.70–1.30)

## 2023-03-10 LAB — LIPID PANEL
HDL: 33 mg/dL — ABNORMAL LOW (ref >=40)
LDL Cholesterol (Calc): 38 mg/dL (calc)

## 2023-03-10 MED ORDER — CLONAZEPAM 1 MG PO TABS
1.0000 mg | ORAL_TABLET | Freq: Two times a day (BID) | ORAL | 0 refills | Status: DC
  Filled 2023-03-10 – 2023-04-19 (×4): qty 60, 30d supply, fill #0

## 2023-03-10 MED ORDER — FLUTICASONE-SALMETEROL 250-50 MCG/ACT IN AEPB
1.0000 | INHALATION_SPRAY | Freq: Two times a day (BID) | RESPIRATORY_TRACT | 5 refills | Status: DC
  Filled 2023-03-10: qty 60, 30d supply, fill #0

## 2023-03-10 NOTE — Assessment & Plan Note (Signed)
Stable on pantoprazole

## 2023-03-10 NOTE — Assessment & Plan Note (Signed)
  Asthma: Some flare-ups likely due to weather changes and pollen. Currently not on a steroid inhaler due to insurance coverage. -Prescribe Advair inhaler (non-diskus form) for better asthma control.

## 2023-03-10 NOTE — Assessment & Plan Note (Signed)
Lab Results  Component Value Date   HGBA1C 6.2 05/11/2022   HGBA1C 6.8 (H) 12/28/2021   HGBA1C 6.1 03/31/2021   Lab Results  Component Value Date   MICROALBUR 1.15 11/20/2013   LDLCALC 33 12/28/2021   CREATININE 0.78 06/02/2022

## 2023-03-10 NOTE — Progress Notes (Signed)
Subjective:     Patient ID: Cody Raymond, male    DOB: Jan 25, 1971, 52 y.o.   MRN: [id number]  Chief Complaint  Patient presents with   Chest Injury    Patient complains of pain on right upper side of chest after falling 6/17.     HPI  Discussed the use of AI scribe software for clinical note transcription with the patient, who gave verbal consent to proceed.  History of Present Illness   The patient presents with left shoulder and right chest pain after a fall. He landed on his left shoulder and is experiencing discomfort in the right chest. He requests an x-ray to rule out any fractures. He also reports a lump in the right chest area. The patient's pain is exacerbated when lying on the right side at night. He has been managing the pain with tramadol and meloxicam, which have been effective for the left shoulder pain.  The patient also reports high levels of stress and anxiety due to family issues. He is the primary caregiver for his grandchildren, one of whom is becoming increasingly aggressive. This situation is causing significant distress, with little time for self-care. The patient has been trying to manage his stress by taking time for himself when possible, but it is not enough. He is currently taking clonazepam twice daily for anxiety, but he feels he needs additional support and is open to therapy.  The patient also reports neuropathic pain, describing a burning sensation in his toes. He is currently taking gabapentin and tramadol for this. He also has a history of asthma, which has had some flare-ups recently due to weather changes and pollen. He is currently taking atorvastatin for cholesterol management and Protonix for reflux.          Health Maintenance Due  Topic Date Due   Diabetic kidney evaluation - Urine ACR  11/21/2014   Zoster Vaccines- Shingrix (1 of 2) Never done   DTaP/Tdap/Td (2 - Td or Tdap) 04/04/2022   COVID-19 Vaccine (4 - 2023-24 season) 05/20/2022    HEMOGLOBIN A1C  11/11/2022    Past Medical History:  Diagnosis Date   Aneurysm (HCC)    Asthma    Blood transfusion    CAD (coronary artery disease), non obstructive on cath 2011 04/05/2012   Coronary artery disease    Diabetes mellitus    Diabetes mellitus without complication (HCC)    Enlarged prostate    GERD (gastroesophageal reflux disease)    H/O syncope    Heart disease    History of repair of patent ductus arteriosus 07/23/2016   Hyperlipidemia    Hypertension    Neuromuscular disorder (HCC)    DJD   Refusal of blood transfusions as patient is Jehovah's Witness     Past Surgical History:  Procedure Laterality Date   APPENDECTOMY     BACK SURGERY     CARDIAC CATHETERIZATION  2007   Clean Cardiac Cath [facility] Cards), with RCA 30% narrowing likely due to catheter induced spasm in 09/02/10.   CARDIAC CATHETERIZATION  09/02/2010   mod. nonobstructive disease in the RCA and CX, tortuous LAD   COLONOSCOPY W/ POLYPECTOMY  03/17/11   diminutive polyp   LOOP RECORDER EXPLANT N/A 01/14/2014   Procedure: LOOP RECORDER EXPLANT;  Surgeon: Thurmon Fair, MD;  Location: [facility] CATH [facility];  Service: Cardiovascular;  Laterality: N/A;   LOOP RECORDER IMPLANT  04/06/2012   Reveal XT 4529   LOOP RECORDER IMPLANT N/A 04/06/2012   Procedure: LOOP  RECORDER IMPLANT;  Surgeon: Thurmon Fair, MD;  Location: [facility] CATH LAB;  Service: Cardiovascular;  Laterality: N/A;   NM MYOCAR PERF WALL MOTION  01/25/2008   mild anteroapical wall ischemia   PATENT DUCTUS ARTERIOUS REPAIR     at age 36    Family History  Problem Relation Age of Onset   Colon cancer Paternal Grandfather    Prostate cancer Paternal Grandfather    Aneurysm Father    Heart attack Father 26   Coronary artery disease Father    Colon polyps Mother    Heart disease Mother    Coronary artery disease Mother    Migraines Mother    Breast cancer Maternal Grandmother    Colon cancer Paternal Uncle        x 2   Stomach cancer  Brother    Liver disease Other        unsure who it was   Allergies Daughter     Social History   Socioeconomic History   Marital status: Married    Spouse name: Not on file   Number of children: Not on file   Years of education: Not on file   Highest education level: Not on file  Occupational History   Not on file  Tobacco Use   Smoking status: Never   Smokeless tobacco: Never  Vaping Use   Vaping Use: Never used  Substance and Sexual Activity   Alcohol use: No   Drug use: No   Sexual activity: Yes  Other Topics Concern   Not on file  Social History Narrative   ** Merged History Encounter **       Holter monitor 08/2010: PVCs and sinus tachy.   Sleep Study (02/2008): mild sleep apnea, no indication for CPAP.   Social Determinants of Health   Financial Resource Strain: Medium Risk (12/06/2021)   Overall Financial Resource Strain (CARDIA)    Difficulty of Paying Living Expenses: Somewhat hard  Food Insecurity: Food Insecurity Present (12/12/2022)   Hunger Vital Sign    Worried About Running Out of Food in the Last Year: Often true    Ran Out of Food in the Last Year: Sometimes true  Transportation Needs: No Transportation Needs (12/12/2022)   PRAPARE - Administrator, Civil Service (Medical): No    Lack of Transportation (Non-Medical): No  Physical Activity: Insufficiently Active (12/06/2021)   Exercise Vital Sign    Days of Exercise per Week: 3 days    Minutes of Exercise per Session: 20 min  Stress: Stress Concern Present (12/06/2021)   [employer] of Occupational Health - Occupational Stress Questionnaire    Feeling of Stress : To some extent  Social Connections: Moderately Integrated (12/06/2021)   Social Connection and Isolation Panel [NHANES]    Frequency of Communication with Friends and Family: More than three times a week    Frequency of Social Gatherings with Friends and Family: More than three times a week    Attends Religious Services:  More than 4 times per year    Active Member of [employer] or Organizations: No    Attends Banker Meetings: Never    Marital Status: Married  Catering manager Violence: Not At Risk (12/12/2022)   Humiliation, Afraid, Rape, and Kick questionnaire    Fear of Current or Ex-Partner: No    Emotionally Abused: No    Physically Abused: No    Sexually Abused: No    Outpatient Medications Prior to Visit  Medication Sig  Dispense Refill   Accu-Chek Softclix Lancets lancets TEST BLOOD SUGAR TWICE DAILY AS DIRECTED 200 each 10   albuterol (PROVENTIL) (2.5 MG/3ML) 0.083% nebulizer solution      Ascorbic Acid (VITAMIN C) 1000 MG tablet Take 1,000 mg by mouth daily.     aspirin EC 81 MG EC tablet Take 1 tablet (81 mg total) by mouth daily. 30 tablet 0   atorvastatin (LIPITOR) 80 MG tablet TAKE 1 TABLET EVERY DAY 90 tablet 0   betamethasone dipropionate (DIPROLENE) 0.05 % cream Apply topically 2 (two) times daily. 30 g 0   Blood Glucose Monitoring Suppl (ACCU-CHEK GUIDE) w/Device KIT USE AS DIRECTED 1 kit 10   cetirizine (ZYRTEC) 10 MG tablet Take 10 mg by mouth daily.     clonazePAM (KLONOPIN) 1 MG tablet TAKE 1/2 TABLET EVERY MORNING AND TAKE 1 TABLET EVERY EVENING 45 tablet 0   DULoxetine (CYMBALTA) 30 MG capsule TAKE 1 CAPSULE EVERY DAY 90 capsule 1   DULoxetine (CYMBALTA) 60 MG capsule TAKE 1 CAPSULE EVERY DAY 90 capsule 3   ezetimibe (ZETIA) 10 MG tablet TAKE 1 TABLET EVERY DAY 90 tablet 0   fluticasone (FLONASE) 50 MCG/ACT nasal spray USE 2 SPRAYS IN EACH NOSTRIL ONE TIME DAILY 48 g 3   gabapentin (NEURONTIN) 300 MG capsule TAKE 1 CAPSULE AT 6AM, 11:30AM, 4:30PM AND TAKE 3 CAPSULES AT 10PM 540 capsule 3   glucose blood (ONETOUCH VERIO) test strip TEST BLOOD SUGAR THREE TIMES DAILY 300 strip 12   ibuprofen (IBU) 800 MG tablet      Icosapent Ethyl (VASCEPA) 1 g CAPS Take 2 capsules (2 g total) by mouth 2 (two) times daily. 120 capsule 0   ipratropium (ATROVENT) 0.03 % nasal spray PLACE 2  SPRAYS INTO BOTH NOSTRILS EVERY 12 (TWELVE) HOURS. 60 mL 0   ipratropium-albuterol (DUONEB) 0.5-2.5 (3) MG/3ML SOLN INHALE 1 VIAL VIA NEBULIZATION EVERY 6 HOURS AS NEEDED 360 mL 1   isosorbide mononitrate (IMDUR) 30 MG 24 hr tablet Take by mouth.     ketoconazole (NIZORAL) 2 % shampoo Apply topically 2 (two) times a week. 360 mL 1   lisinopril (ZESTRIL) 10 MG tablet Take 0.5 tablets (5 mg total) by mouth daily. 45 tablet 3   meloxicam (MOBIC) 7.5 MG tablet TAKE 1 TABLET (7.5 MG TOTAL) DAILY AS NEEDED FOR PAIN 90 tablet 0   metFORMIN (GLUCOPHAGE-XR) 500 MG 24 hr tablet TAKE 2 TABLETS EVERY DAY WITH BREAKFAST 180 tablet 1   Metoprolol Tartrate 75 MG TABS TAKE 1 TABLET TWICE DAILY 180 tablet 1   montelukast (SINGULAIR) 10 MG tablet TAKE 1 TABLET (10 MG TOTAL) AT BEDTIME 90 tablet 0   Multiple Vitamin (MULTIVITAMIN WITH MINERALS) TABS tablet Take 1 tablet by mouth daily.     nitroGLYCERIN (NITROSTAT) 0.4 MG SL tablet Place 1 tablet (0.4 mg total) under the tongue every 5 (five) minutes as needed for chest pain. 25 tablet 3   pantoprazole (PROTONIX) 40 MG tablet TAKE 1 TABLET EVERY DAY 90 tablet 3   sucralfate (CARAFATE) 1 g tablet Take 1 tablet (1 g total) by mouth in the morning, at noon, and at bedtime. 21 tablet 0   tamsulosin (FLOMAX) 0.4 MG CAPS capsule TAKE 1 CAPSULE EVERY DAY 90 capsule 1   tiZANidine (ZANAFLEX) 2 MG tablet TAKE 1 TABLET EVERY 6 HOURS AS NEEDED FOR MUSCLE SPASM(S) 360 tablet 3   traMADol (ULTRAM) 50 MG tablet Take 1 tablet (50 mg total) by mouth 2 (two) times daily as  needed. 60 tablet 5   traZODone (DESYREL) 100 MG tablet TAKE 1 TABLET AT BEDTIME 90 tablet 0   zolpidem (AMBIEN) 10 MG tablet TAKE 1 TABLET AT BEDTIME AS NEEDED FOR SLEEP 30 tablet 0   fluticasone-salmeterol (WIXELA INHUB) 250-50 MCG/ACT AEPB Inhale 1 puff into the lungs in the morning and at bedtime. 60 each 6   No facility-administered medications prior to visit.    Allergies  Allergen Reactions    Diphenhydramine     Other reaction(s): confusion   Diphenhydramine Hcl     Other Reaction(s): Mental Status Changes   Benadryl [Diphenhydramine Hcl]     "drives me nuts"    ROS    See HPI Objective:    Physical Exam Constitutional:      General: He is not in acute distress.    Appearance: He is well-developed.  HENT:     Head: Normocephalic and atraumatic.  Cardiovascular:     Rate and Rhythm: Normal rate and regular rhythm.     Heart sounds: No murmur heard. Pulmonary:     Effort: Pulmonary effort is normal. No respiratory distress.     Breath sounds: Normal breath sounds. No wheezing or rales.  Skin:    General: Skin is warm and dry.  Neurological:     Mental Status: He is alert and oriented to person, place, and time.  Psychiatric:        Behavior: Behavior normal.        Thought Content: Thought content normal.      BP 104/70 (BP Location: Right Arm, Patient Position: Sitting, Cuff Size: Small)   Pulse 69   Temp 98.1 F (36.7 C) (Oral)   Resp 16   Wt 196 lb (88.9 kg)   SpO2 99%   BMI 29.80 kg/m  Wt Readings from Last 3 Encounters:  03/10/23 196 lb (88.9 kg)  02/17/23 197 lb (89.4 kg)  01/27/23 194 lb 9.6 oz (88.3 kg)       Assessment & Plan:   Problem List Items Addressed This Visit       Unprioritized   Type 2 diabetes, controlled, with peripheral neuropathy (HCC)    Lab Results  Component Value Date   HGBA1C 6.2 05/11/2022   HGBA1C 6.8 (H) 12/28/2021   HGBA1C 6.1 03/31/2021   Lab Results  Component Value Date   MICROALBUR 1.15 11/20/2013   LDLCALC 33 12/28/2021   CREATININE 0.78 06/02/2022        Relevant Orders   Comp Met (CMET)   HgB A1c   Urine Microalbumin w/creat. ratio   Rib pain on right side - Primary   Relevant Orders   DG Ribs Unilateral Right   Peripheral neuropathy    Fair control- conitnues gabapentin      GERD    Stable on pantoprazole      Dyslipidemia   Relevant Orders   Lipid panel   Depression with  anxiety    Increased anxiety.  Recommended counseling. He will continue klonopin at 1mg  bid as well as cymbalta.  I gave him information to schedule with a counselor.  Controlled substance contract is updated and UDS ordered.       Relevant Orders   DRUG MONITORING, PANEL 8 WITH CONFIRMATION, URINE   Asthma     Asthma: Some flare-ups likely due to weather changes and pollen. Currently not on a steroid inhaler due to insurance coverage. -Prescribe Advair inhaler (non-diskus form) for better asthma control.      Relevant  Medications   fluticasone-salmeterol (ADVAIR) 250-50 MCG/ACT AEPB   General Health Maintenance: -Check kidney function and A1C today. -Perform foot exam today (completed). -Advise patient to check with pharmacy regarding coverage for tetanus and shingles vaccines. -Schedule follow-up visit in four months.  I have discontinued Shante Wolfrey "Lenny"'s fluticasone-salmeterol. I am also having him start on fluticasone-salmeterol. Additionally, I am having him maintain his cetirizine, multivitamin with minerals, vitamin C, aspirin EC, icosapent Ethyl, betamethasone dipropionate, ketoconazole, ipratropium-albuterol, sucralfate, albuterol, ibuprofen, isosorbide mononitrate, Accu-Chek Guide, Accu-Chek Softclix Lancets, gabapentin, ipratropium, fluticasone, pantoprazole, metFORMIN, DULoxetine, tamsulosin, tiZANidine, OneTouch Verio, traMADol, nitroGLYCERIN, lisinopril, Metoprolol Tartrate, atorvastatin, ezetimibe, montelukast, traZODone, meloxicam, clonazePAM, zolpidem, and DULoxetine.  Meds ordered this encounter  Medications   fluticasone-salmeterol (ADVAIR) 250-50 MCG/ACT AEPB    Sig: Inhale 1 puff into the lungs in the morning and at bedtime.    Dispense:  60 each    Refill:  5    Order Specific Question:   Supervising Provider    Answer:   Danise Edge A [[id number]]

## 2023-03-10 NOTE — Assessment & Plan Note (Addendum)
Increased anxiety.  Recommended counseling. He will continue klonopin at 1mg  bid as well as cymbalta.  I gave him information to schedule with a counselor.  Controlled substance contract is updated and UDS ordered.

## 2023-03-10 NOTE — Patient Instructions (Signed)
VISIT SUMMARY:  During your visit, we discussed several health concerns including the pain in your left shoulder and right chest after a recent fall, your high levels of stress and anxiety, your asthma, high cholesterol, burning sensation in your toes, and acid reflux. We also discussed your general health maintenance.  YOUR PLAN:  -FALL WITH CHEST PAIN: You fell recently and have been experiencing pain in your right chest and left shoulder. We will order a chest X-ray to make sure there are no fractures or other injuries.  -ANXIETY AND STRESS: You've been feeling a lot of stress and anxiety due to your family situation. We will increase your clonazepam medication and provide you with information on counseling services for additional support.  -ASTHMA: You've had some asthma flare-ups recently, likely due to weather changes and pollen. We will prescribe an Advair inhaler to help control your asthma.  -HIGH CHOLESTEROL: You're currently taking atorvastatin for high cholesterol. We will check your cholesterol levels today to see how well the medication is working.  -BURNING SENSATION IN TOES: You've been experiencing a burning sensation in your toes, which is a symptom of peripheral neuropathy. We will continue your current medications, gabapentin and tramadol, to manage this.  -ACID REFLUX: You're currently taking Protonix for acid reflux. We will continue this medication.

## 2023-03-10 NOTE — Assessment & Plan Note (Signed)
Fair control- conitnues gabapentin

## 2023-03-11 ENCOUNTER — Other Ambulatory Visit (HOSPITAL_BASED_OUTPATIENT_CLINIC_OR_DEPARTMENT_OTHER): Payer: Self-pay

## 2023-03-11 LAB — COMPREHENSIVE METABOLIC PANEL
AG Ratio: 1.7 (calc) (ref 1.0–2.5)
ALT: 17 U/L (ref 9–46)
AST: 18 U/L (ref 10–35)
Albumin: 4.3 g/dL (ref 3.6–5.1)
Alkaline phosphatase (APISO): 105 U/L (ref 35–144)
CO2: 29 mmol/L (ref 20–32)
Calcium: 9.2 mg/dL (ref 8.6–10.3)
Chloride: 102 mmol/L (ref 98–110)
Globulin: 2.6 g/dL (calc) (ref 1.9–3.7)
Glucose, Bld: 91 mg/dL (ref 65–99)
Potassium: 4.6 mmol/L (ref 3.5–5.3)
Sodium: 139 mmol/L (ref 135–146)
Total Bilirubin: 0.7 mg/dL (ref 0.2–1.2)
Total Protein: 6.9 g/dL (ref 6.1–8.1)

## 2023-03-11 LAB — HEMOGLOBIN A1C
Hgb A1c MFr Bld: 6.1 % of total Hgb — ABNORMAL HIGH (ref <5.7)
Mean Plasma Glucose: 128 mg/dL
eAG (mmol/L): 7.1 mmol/L

## 2023-03-11 LAB — LIPID PANEL
Cholesterol: 97 mg/dL (ref <200)
Non-HDL Cholesterol (Calc): 64 mg/dL (calc) (ref <130)
Total CHOL/HDL Ratio: 2.9 (calc) (ref <5.0)
Triglycerides: 182 mg/dL — ABNORMAL HIGH (ref <150)

## 2023-03-11 LAB — MICROALBUMIN / CREATININE URINE RATIO
Creatinine, Urine: 179 mg/dL (ref 20–320)
Microalb Creat Ratio: 3 mg/g creat (ref <30)
Microalb, Ur: 0.5 mg/dL

## 2023-03-12 LAB — DRUG MONITORING, PANEL 8 WITH CONFIRMATION, URINE
6 Acetylmorphine: NEGATIVE ng/mL (ref <10)
Alcohol Metabolites: NEGATIVE ng/mL (ref <500)
Alphahydroxyalprazolam: NEGATIVE ng/mL (ref <25)
Alphahydroxymidazolam: NEGATIVE ng/mL (ref <50)
Alphahydroxytriazolam: NEGATIVE ng/mL (ref <50)
Aminoclonazepam: 617 ng/mL — ABNORMAL HIGH (ref <25)
Amphetamines: NEGATIVE ng/mL (ref <500)
Benzodiazepines: POSITIVE ng/mL — AB (ref <100)
Buprenorphine, Urine: NEGATIVE ng/mL (ref <5)
Cocaine Metabolite: NEGATIVE ng/mL (ref <150)
Creatinine: 184.2 mg/dL (ref >=20.0)
Hydroxyethylflurazepam: NEGATIVE ng/mL (ref <50)
Lorazepam: NEGATIVE ng/mL (ref <50)
MDMA: NEGATIVE ng/mL (ref <500)
Marijuana Metabolite: NEGATIVE ng/mL (ref <20)
Nordiazepam: NEGATIVE ng/mL (ref <50)
Opiates: NEGATIVE ng/mL (ref <100)
Oxazepam: NEGATIVE ng/mL (ref <50)
Oxidant: NEGATIVE ug/mL (ref <200)
Oxycodone: NEGATIVE ng/mL (ref <100)
Temazepam: NEGATIVE ng/mL (ref <50)
pH: 6.6 (ref 4.5–9.0)

## 2023-03-12 LAB — DM TEMPLATE

## 2023-03-13 ENCOUNTER — Other Ambulatory Visit (HOSPITAL_BASED_OUTPATIENT_CLINIC_OR_DEPARTMENT_OTHER): Payer: Self-pay

## 2023-03-15 ENCOUNTER — Telehealth: Payer: Self-pay | Admitting: Family

## 2023-03-15 NOTE — Telephone Encounter (Signed)
See mychart.  

## 2023-03-17 ENCOUNTER — Encounter: Payer: Self-pay | Admitting: Family

## 2023-03-20 ENCOUNTER — Encounter: Payer: Medicare HMO | Admitting: Registered Nurse

## 2023-03-21 ENCOUNTER — Ambulatory Visit (INDEPENDENT_AMBULATORY_CARE_PROVIDER_SITE_OTHER): Payer: Medicare HMO | Admitting: Family

## 2023-03-21 ENCOUNTER — Other Ambulatory Visit (HOSPITAL_BASED_OUTPATIENT_CLINIC_OR_DEPARTMENT_OTHER): Payer: Self-pay

## 2023-03-21 VITALS — BP 104/62 | HR 84 | Temp 97.9°F | Resp 18 | Ht 68.0 in | Wt 201.0 lb

## 2023-03-21 DIAGNOSIS — J45901 Unspecified asthma with (acute) exacerbation: Secondary | ICD-10-CM

## 2023-03-21 DIAGNOSIS — R52 Pain, unspecified: Secondary | ICD-10-CM | POA: Diagnosis not present

## 2023-03-21 LAB — POC COVID19 BINAXNOW: SARS Coronavirus 2 Ag: NEGATIVE

## 2023-03-21 MED ORDER — ALBUTEROL SULFATE HFA 108 (90 BASE) MCG/ACT IN AERS
2.0000 | INHALATION_SPRAY | Freq: Four times a day (QID) | RESPIRATORY_TRACT | 5 refills | Status: DC | PRN
  Filled 2023-03-21: qty 6.7, 25d supply, fill #0
  Filled 2023-11-21: qty 6.7, 25d supply, fill #1

## 2023-03-21 MED ORDER — ALBUTEROL SULFATE (2.5 MG/3ML) 0.083% IN NEBU
2.5000 mg | INHALATION_SOLUTION | Freq: Four times a day (QID) | RESPIRATORY_TRACT | 5 refills | Status: DC | PRN
  Filled 2023-03-21: qty 75, 7d supply, fill #0

## 2023-03-21 MED ORDER — PREDNISONE 10 MG PO TABS
ORAL_TABLET | ORAL | 0 refills | Status: DC
  Filled 2023-03-21: qty 20, 8d supply, fill #0

## 2023-03-21 NOTE — Progress Notes (Signed)
e  Subjective:     Patient ID: Cody Raymond, male    DOB: [birth year], 52 y.o.   MRN: [id number]  Chief Complaint  Patient presents with   Cough    Cough    Discussed the use of AI scribe software for clinical note transcription with the patient, who gave verbal consent to proceed.  History of Present Illness             Health Maintenance Due  Topic Date Due   Zoster Vaccines- Shingrix (1 of 2) Never done   DTaP/Tdap/Td (2 - Td or Tdap) 04/04/2022   COVID-19 Vaccine (4 - 2023-24 season) 05/20/2022    Past Medical History:  Diagnosis Date   Aneurysm (HCC)    Asthma    Blood transfusion    CAD (coronary artery disease), non obstructive on cath 2011 04/05/2012   Coronary artery disease    Diabetes mellitus    Diabetes mellitus without complication (HCC)    Enlarged prostate    GERD (gastroesophageal reflux disease)    H/O syncope    Heart disease    History of repair of patent ductus arteriosus 07/23/2016   Hyperlipidemia    Hypertension    Neuromuscular disorder (HCC)    DJD   Refusal of blood transfusions as patient is Jehovah's Witness     Past Surgical History:  Procedure Laterality Date   APPENDECTOMY     BACK SURGERY     CARDIAC CATHETERIZATION  2007   Clean Cardiac Cath [facility]), with RCA 30% narrowing likely due to catheter induced spasm in 09/02/10.   CARDIAC CATHETERIZATION  09/02/2010   mod. nonobstructive disease in the RCA and CX, tortuous LAD   COLONOSCOPY W/ POLYPECTOMY  03/17/11   diminutive polyp   LOOP RECORDER EXPLANT N/A 01/14/2014   Procedure: LOOP RECORDER EXPLANT;  Surgeon: Thurmon Fair, MD;  Location: [facility] CATH LAB;  Service: Cardiovascular;  Laterality: N/A;   LOOP RECORDER IMPLANT  04/06/2012   Reveal XT [id number]   LOOP RECORDER IMPLANT N/A 04/06/2012   Procedure: LOOP RECORDER IMPLANT;  Surgeon: Thurmon Fair, MD;  Location: [facility] CATH LAB;  Service: Cardiovascular;  Laterality: N/A;   NM MYOCAR PERF WALL MOTION  01/25/2008    mild anteroapical wall ischemia   PATENT DUCTUS ARTERIOUS REPAIR     at age 47    Family History  Problem Relation Age of Onset   Colon cancer Paternal Grandfather    Prostate cancer Paternal Grandfather    Aneurysm Father    Heart attack Father 41   Coronary artery disease Father    Colon polyps Mother    Heart disease Mother    Coronary artery disease Mother    Migraines Mother    Breast cancer Maternal Grandmother    Colon cancer Paternal Uncle        x 2   Stomach cancer Brother    Liver disease Other        unsure who it was   Allergies Daughter     Social History   Socioeconomic History   Marital status: Married    Spouse name: Not on file   Number of children: Not on file   Years of education: Not on file   Highest education level: Not on file  Occupational History   Not on file  Tobacco Use   Smoking status: Never   Smokeless tobacco: Never  Vaping Use   Vaping Use: Never used  Substance and Sexual Activity  Alcohol use: No   Drug use: No   Sexual activity: Yes  Other Topics Concern   Not on file  Social History Narrative   ** Merged History Encounter **       Holter monitor 08/2010: PVCs and sinus tachy.   Sleep Study (02/2008): mild sleep apnea, no indication for CPAP.   Social Determinants of Health   Financial Resource Strain: Medium Risk (12/06/2021)   Overall Financial Resource Strain (CARDIA)    Difficulty of Paying Living Expenses: Somewhat hard  Food Insecurity: Food Insecurity Present (12/12/2022)   Hunger Vital Sign    Worried About Running Out of Food in the Last Year: Often true    Ran Out of Food in the Last Year: Sometimes true  Transportation Needs: No Transportation Needs (12/12/2022)   PRAPARE - Administrator, Civil Service (Medical): No    Lack of Transportation (Non-Medical): No  Physical Activity: Insufficiently Active (12/06/2021)   Exercise Vital Sign    Days of Exercise per Week: 3 days    Minutes of  Exercise per Session: 20 min  Stress: Stress Concern Present (12/06/2021)   [employer] of Occupational Health - Occupational Stress Questionnaire    Feeling of Stress : To some extent  Social Connections: Moderately Integrated (12/06/2021)   Social Connection and Isolation Panel [NHANES]    Frequency of Communication with Friends and Family: More than three times a week    Frequency of Social Gatherings with Friends and Family: More than three times a week    Attends Religious Services: More than 4 times per year    Active Member of [employer] or Organizations: No    Attends Banker Meetings: Never    Marital Status: Married  Catering manager Violence: Not At Risk (12/12/2022)   Humiliation, Afraid, Rape, and Kick questionnaire    Fear of Current or Ex-Partner: No    Emotionally Abused: No    Physically Abused: No    Sexually Abused: No    Outpatient Medications Prior to Visit  Medication Sig Dispense Refill   Accu-Chek Softclix Lancets lancets TEST BLOOD SUGAR TWICE DAILY AS DIRECTED 200 each 10   Ascorbic Acid (VITAMIN C) 1000 MG tablet Take 1,000 mg by mouth daily.     aspirin EC 81 MG EC tablet Take 1 tablet (81 mg total) by mouth daily. 30 tablet 0   atorvastatin (LIPITOR) 80 MG tablet TAKE 1 TABLET EVERY DAY 90 tablet 0   betamethasone dipropionate (DIPROLENE) 0.05 % cream Apply topically 2 (two) times daily. 30 g 0   Blood Glucose Monitoring Suppl (ACCU-CHEK GUIDE) w/Device KIT USE AS DIRECTED 1 kit 10   cetirizine (ZYRTEC) 10 MG tablet Take 10 mg by mouth daily.     clonazePAM (KLONOPIN) 1 MG tablet Take 1 tablet (1 mg total) by mouth 2 (two) times daily. 60 tablet 0   DULoxetine (CYMBALTA) 30 MG capsule TAKE 1 CAPSULE EVERY DAY 90 capsule 1   DULoxetine (CYMBALTA) 60 MG capsule TAKE 1 CAPSULE EVERY DAY 90 capsule 3   ezetimibe (ZETIA) 10 MG tablet TAKE 1 TABLET EVERY DAY 90 tablet 0   fluticasone (FLONASE) 50 MCG/ACT nasal spray USE 2 SPRAYS IN EACH NOSTRIL ONE  TIME DAILY 48 g 3   fluticasone-salmeterol (ADVAIR) 250-50 MCG/ACT AEPB Inhale 1 puff into the lungs in the morning and at bedtime. 60 each 5   gabapentin (NEURONTIN) 300 MG capsule TAKE 1 CAPSULE AT 6AM, 11:30AM, 4:30PM AND TAKE 3 CAPSULES  AT 10PM 540 capsule 3   glucose blood (ONETOUCH VERIO) test strip TEST BLOOD SUGAR THREE TIMES DAILY 300 strip 12   ibuprofen (IBU) 800 MG tablet      Icosapent Ethyl (VASCEPA) 1 g CAPS Take 2 capsules (2 g total) by mouth 2 (two) times daily. 120 capsule 0   ipratropium (ATROVENT) 0.03 % nasal spray PLACE 2 SPRAYS INTO BOTH NOSTRILS EVERY 12 (TWELVE) HOURS. 60 mL 0   isosorbide mononitrate (IMDUR) 30 MG 24 hr tablet Take by mouth.     ketoconazole (NIZORAL) 2 % shampoo Apply topically 2 (two) times a week. 360 mL 1   lisinopril (ZESTRIL) 10 MG tablet Take 0.5 tablets (5 mg total) by mouth daily. 45 tablet 3   meloxicam (MOBIC) 7.5 MG tablet TAKE 1 TABLET (7.5 MG TOTAL) DAILY AS NEEDED FOR PAIN 90 tablet 0   metFORMIN (GLUCOPHAGE-XR) 500 MG 24 hr tablet TAKE 2 TABLETS EVERY DAY WITH BREAKFAST 180 tablet 1   Metoprolol Tartrate 75 MG TABS TAKE 1 TABLET TWICE DAILY 180 tablet 1   montelukast (SINGULAIR) 10 MG tablet TAKE 1 TABLET (10 MG TOTAL) AT BEDTIME 90 tablet 0   Multiple Vitamin (MULTIVITAMIN WITH MINERALS) TABS tablet Take 1 tablet by mouth daily.     nitroGLYCERIN (NITROSTAT) 0.4 MG SL tablet Place 1 tablet (0.4 mg total) under the tongue every 5 (five) minutes as needed for chest pain. 25 tablet 3   pantoprazole (PROTONIX) 40 MG tablet TAKE 1 TABLET EVERY DAY 90 tablet 3   sucralfate (CARAFATE) 1 g tablet Take 1 tablet (1 g total) by mouth in the morning, at noon, and at bedtime. 21 tablet 0   tamsulosin (FLOMAX) 0.4 MG CAPS capsule TAKE 1 CAPSULE EVERY DAY 90 capsule 1   tiZANidine (ZANAFLEX) 2 MG tablet TAKE 1 TABLET EVERY 6 HOURS AS NEEDED FOR MUSCLE SPASM(S) 360 tablet 3   traMADol (ULTRAM) 50 MG tablet Take 1 tablet (50 mg total) by mouth 2 (two)  times daily as needed. 60 tablet 5   traZODone (DESYREL) 100 MG tablet TAKE 1 TABLET AT BEDTIME 90 tablet 0   zolpidem (AMBIEN) 10 MG tablet TAKE 1 TABLET AT BEDTIME AS NEEDED FOR SLEEP 30 tablet 0   albuterol (PROVENTIL) (2.5 MG/3ML) 0.083% nebulizer solution      ipratropium-albuterol (DUONEB) 0.5-2.5 (3) MG/3ML SOLN INHALE 1 VIAL VIA NEBULIZATION EVERY 6 HOURS AS NEEDED 360 mL 1   No facility-administered medications prior to visit.    Allergies  Allergen Reactions   Diphenhydramine     Other reaction(s): confusion   Diphenhydramine Hcl     Other Reaction(s): Mental Status Changes   Benadryl [Diphenhydramine Hcl]     "drives me nuts"    Review of Systems  Respiratory:  Positive for cough.        Objective:    Physical Exam Constitutional:      General: He is not in acute distress.    Appearance: He is well-developed.  HENT:     Head: Normocephalic and atraumatic.     Right Ear: There is impacted cerumen.     Left Ear: Tympanic membrane and ear canal normal.  Cardiovascular:     Rate and Rhythm: Normal rate and regular rhythm.     Heart sounds: No murmur heard. Pulmonary:     Effort: Pulmonary effort is normal. No respiratory distress.     Breath sounds: Wheezing present. No rales.  Skin:    General: Skin is warm and dry.  Neurological:     Mental Status: He is alert and oriented to person, place, and time.  Psychiatric:        Behavior: Behavior normal.        Thought Content: Thought content normal.      BP 104/62   Pulse 84   Temp 97.9 F (36.6 C)   Resp 18   Ht 5\' 8"  (1.727 m)   Wt 201 lb (91.2 kg)   SpO2 99%   BMI 30.56 kg/m  Wt Readings from Last 3 Encounters:  03/21/23 201 lb (91.2 kg)  03/10/23 196 lb (88.9 kg)  02/17/23 197 lb (89.4 kg)       Assessment & Plan:   Problem List Items Addressed This Visit       Unprioritized   Asthma    Acute exacerbation in the setting of viral illness. Covid testing is negative.     Symptoms  started on Saturday with sore throat, runny nose, sinus pressure, and coughing up dark yellow phlegm. No fever reported. Currently taking Mucinex with no relief. Likely viral infection. -Continue Mucinex as needed. -If symptoms do not improve significantly by Monday, 03/27/2023, consider starting an antibiotic.      Relevant Medications   predniSONE (DELTASONE) 10 MG tablet   albuterol (VENTOLIN HFA) 108 (90 Base) MCG/ACT inhaler   albuterol (PROVENTIL) (2.5 MG/3ML) 0.083% nebulizer solution   Other Visit Diagnoses     Body aches    -  Primary   Relevant Orders   POC COVID-19 (Completed)       I have discontinued Sabre Baumert "Lenny"'s ipratropium-albuterol. I have also changed his albuterol. Additionally, I am having him start on predniSONE and albuterol. Lastly, I am having him maintain his cetirizine, multivitamin with minerals, vitamin C, aspirin EC, icosapent Ethyl, betamethasone dipropionate, ketoconazole, sucralfate, ibuprofen, isosorbide mononitrate, Accu-Chek Guide, Accu-Chek Softclix Lancets, gabapentin, ipratropium, fluticasone, pantoprazole, metFORMIN, DULoxetine, tamsulosin, tiZANidine, OneTouch Verio, traMADol, nitroGLYCERIN, lisinopril, Metoprolol Tartrate, atorvastatin, ezetimibe, montelukast, traZODone, meloxicam, zolpidem, DULoxetine, fluticasone-salmeterol, and clonazePAM.  Meds ordered this encounter  Medications   predniSONE (DELTASONE) 10 MG tablet    Sig: Take 4 tablets by mouth once daily for 2 days, then 3 tablets daily for 2 days, then 2 tablets daily for 2 days, and then 1 tablet daily for 2 days.    Dispense:  20 tablet    Refill:  0    Order Specific Question:   Supervising Provider    Answer:   Danise Edge A [[id number]]   albuterol (VENTOLIN HFA) 108 (90 Base) MCG/ACT inhaler    Sig: Inhale 2 puffs into the lungs every 6 (six) hours as needed for wheezing or shortness of breath.    Dispense:  6.7 g    Refill:  5    Order Specific Question:   Supervising  Provider    Answer:   Danise Edge A [[id number]]   albuterol (PROVENTIL) (2.5 MG/3ML) 0.083% nebulizer solution    Sig: Take 3 mLs (2.5 mg total) by nebulization every 6 (six) hours as needed for wheezing or shortness of breath.    Dispense:  75 mL    Refill:  5    Order Specific Question:   Supervising Provider    Answer:   Danise Edge A [[id number]]

## 2023-03-21 NOTE — Assessment & Plan Note (Signed)
Acute exacerbation in the setting of viral illness. Covid testing is negative.     Symptoms started on Saturday with sore throat, runny nose, sinus pressure, and coughing up dark yellow phlegm. No fever reported. Currently taking Mucinex with no relief. Likely viral infection. -Continue Mucinex as needed. -If symptoms do not improve significantly by Monday, 03/27/2023, consider starting an antibiotic.

## 2023-03-28 ENCOUNTER — Other Ambulatory Visit: Payer: Self-pay | Admitting: Family

## 2023-03-28 MED ORDER — AZITHROMYCIN 250 MG PO TABS: ORAL_TABLET | ORAL | 0 refills | Status: AC

## 2023-04-09 DIAGNOSIS — Z20822 Contact with and (suspected) exposure to covid-19: Secondary | ICD-10-CM | POA: Diagnosis not present

## 2023-04-09 DIAGNOSIS — R509 Fever, unspecified: Secondary | ICD-10-CM | POA: Diagnosis not present

## 2023-04-09 DIAGNOSIS — J4541 Moderate persistent asthma with (acute) exacerbation: Secondary | ICD-10-CM | POA: Diagnosis not present

## 2023-04-09 DIAGNOSIS — R062 Wheezing: Secondary | ICD-10-CM | POA: Diagnosis not present

## 2023-04-10 ENCOUNTER — Encounter (INDEPENDENT_AMBULATORY_CARE_PROVIDER_SITE_OTHER): Payer: Medicare HMO | Admitting: Ophthalmology

## 2023-04-11 ENCOUNTER — Other Ambulatory Visit: Payer: Self-pay

## 2023-04-11 MED ORDER — FLUTICASONE-SALMETEROL 250-50 MCG/ACT IN AEPB: 1.0000 | INHALATION_SPRAY | Freq: Two times a day (BID) | RESPIRATORY_TRACT | 5 refills | Status: DC

## 2023-04-19 ENCOUNTER — Other Ambulatory Visit (HOSPITAL_BASED_OUTPATIENT_CLINIC_OR_DEPARTMENT_OTHER): Payer: Self-pay

## 2023-04-24 ENCOUNTER — Other Ambulatory Visit: Payer: Self-pay

## 2023-04-24 ENCOUNTER — Other Ambulatory Visit (HOSPITAL_BASED_OUTPATIENT_CLINIC_OR_DEPARTMENT_OTHER): Payer: Self-pay

## 2023-04-24 ENCOUNTER — Encounter: Payer: Self-pay | Admitting: Family

## 2023-04-24 MED ORDER — LISINOPRIL 10 MG PO TABS
5.0000 mg | ORAL_TABLET | Freq: Every day | ORAL | 3 refills | Status: DC
  Filled 2023-04-24 – 2023-04-25 (×3): qty 45, 90d supply, fill #0

## 2023-04-25 ENCOUNTER — Ambulatory Visit: Payer: Medicare HMO | Admitting: Family

## 2023-04-25 ENCOUNTER — Other Ambulatory Visit (HOSPITAL_BASED_OUTPATIENT_CLINIC_OR_DEPARTMENT_OTHER): Payer: Self-pay

## 2023-04-25 VITALS — BP 111/72 | HR 87 | Temp 98.5°F | Resp 16 | Wt 198.0 lb

## 2023-04-25 DIAGNOSIS — J4540 Moderate persistent asthma, uncomplicated: Secondary | ICD-10-CM | POA: Diagnosis not present

## 2023-04-25 DIAGNOSIS — Z7984 Long term (current) use of oral hypoglycemic drugs: Secondary | ICD-10-CM | POA: Diagnosis not present

## 2023-04-25 DIAGNOSIS — I10 Essential (primary) hypertension: Secondary | ICD-10-CM

## 2023-04-25 DIAGNOSIS — U071 COVID-19: Secondary | ICD-10-CM | POA: Insufficient documentation

## 2023-04-25 DIAGNOSIS — E1142 Type 2 diabetes mellitus with diabetic polyneuropathy: Secondary | ICD-10-CM

## 2023-04-25 LAB — POC COVID19 BINAXNOW: SARS Coronavirus 2 Ag: POSITIVE — AB

## 2023-04-25 MED ORDER — PAXLOVID (300/100) 20 X 150 MG & 10 X 100MG PO TBPK
ORAL_TABLET | ORAL | 0 refills | Status: DC
Start: 2023-04-25 — End: 2023-07-11
  Filled 2023-04-25: qty 30, 5d supply, fill #0

## 2023-04-25 MED ORDER — PREDNISONE 10 MG PO TABS
ORAL_TABLET | ORAL | 0 refills | Status: DC
  Filled 2023-04-25: qty 20, 8d supply, fill #0

## 2023-04-25 NOTE — Assessment & Plan Note (Addendum)
Continue with the Albuterol to help with your asthma symptoms. Please hold of on the Advair while you are taking Paxlovid. Hold off for 24hrs before restarting your Advair once you have completed Paxlovid.

## 2023-04-25 NOTE — Progress Notes (Unsigned)
Subjective:     Patient ID: Cody Raymond, male    DOB: 06-Oct-1970, 52 y.o.   MRN: [id number]  Chief Complaint  Patient presents with   Covid Exposure    Patient reports his daughter was diagnosed with covid last week   Fever    Reports temperature 100.3 yesterday   Nasal Congestion    Complains of congestion   Generalized Body Aches    Fever  Associated symptoms include congestion, coughing, diarrhea, headaches and a sore throat. Pertinent negatives include no ear pain.    Discussed the use of AI scribe software for clinical note transcription with the patient, who gave verbal consent to proceed.  52 year old male presents to the clinic today for covid exposure from daughter. Patient states that his daughter tested postive for covid 5 days ago and he was around her taking her meals and taking her to the doctor and was not wearing a mask, Patient states he is having diarrhea, body aches, runny nose, itchy throat, headaches, loss of taste, and fever noted for a few days. Last covid booster was in 2021. He states he is eating fine.       Health Maintenance Due  Topic Date Due   Zoster Vaccines- Shingrix (1 of 2) Never done   DTaP/Tdap/Td (2 - Td or Tdap) 04/04/2022   COVID-19 Vaccine (4 - 2023-24 season) 05/20/2022   OPHTHALMOLOGY EXAM  04/08/2023   INFLUENZA VACCINE  04/20/2023    Past Medical History:  Diagnosis Date   Aneurysm (HCC)    Asthma    Blood transfusion    CAD (coronary artery disease), non obstructive on cath 2011 04/05/2012   Coronary artery disease    Diabetes mellitus    Diabetes mellitus without complication (HCC)    Enlarged prostate    GERD (gastroesophageal reflux disease)    H/O syncope    Heart disease    History of repair of patent ductus arteriosus 07/23/2016   Hyperlipidemia    Hypertension    Neuromuscular disorder (HCC)    DJD   Refusal of blood transfusions as patient is Jehovah's Witness     Past Surgical History:  Procedure  Laterality Date   APPENDECTOMY     BACK SURGERY     CARDIAC CATHETERIZATION  2007   Clean Cardiac Cath [facility] Cards), with RCA 30% narrowing likely due to catheter induced spasm in 09/02/10.   CARDIAC CATHETERIZATION  09/02/2010   mod. nonobstructive disease in the RCA and CX, tortuous LAD   COLONOSCOPY W/ POLYPECTOMY  03/17/11   diminutive polyp   LOOP RECORDER EXPLANT N/A 01/14/2014   Procedure: LOOP RECORDER EXPLANT;  Surgeon: Thurmon Fair, MD;  Location: [facility] CATH [facility];  Service: Cardiovascular;  Laterality: N/A;   LOOP RECORDER IMPLANT  04/06/2012   Reveal XT [id number]   LOOP RECORDER IMPLANT N/A 04/06/2012   Procedure: LOOP RECORDER IMPLANT;  Surgeon: Thurmon Fair, MD;  Location: [facility] CATH [facility];  Service: Cardiovascular;  Laterality: N/A;   NM MYOCAR PERF WALL MOTION  01/25/2008   mild anteroapical wall ischemia   PATENT DUCTUS ARTERIOUS REPAIR     at age 80    Family History  Problem Relation Age of Onset   Colon cancer Paternal Grandfather    Prostate cancer Paternal Grandfather    Aneurysm Father    Heart attack Father 43   Coronary artery disease Father    Colon polyps Mother    Heart disease Mother    Coronary artery disease  Mother    Migraines Mother    Breast cancer Maternal Grandmother    Colon cancer Paternal Uncle        x 2   Stomach cancer Brother    Liver disease Other        unsure who it was   Allergies Daughter     Social History   Socioeconomic History   Marital status: Married    Spouse name: Not on file   Number of children: Not on file   Years of education: Not on file   Highest education level: Not on file  Occupational History   Not on file  Tobacco Use   Smoking status: Never   Smokeless tobacco: Never  Vaping Use   Vaping status: Never Used  Substance and Sexual Activity   Alcohol use: No   Drug use: No   Sexual activity: Yes  Other Topics Concern   Not on file  Social History Narrative   ** Merged History Encounter **        Holter monitor 08/2010: PVCs and sinus tachy.   Sleep Study (02/2008): mild sleep apnea, no indication for CPAP.   Social Determinants of Health   Financial Resource Strain: Medium Risk (12/06/2021)   Overall Financial Resource Strain (CARDIA)    Difficulty of Paying Living Expenses: Somewhat hard  Food Insecurity: Food Insecurity Present (12/12/2022)   Hunger Vital Sign    Worried About Running Out of Food in the Last Year: Often true    Ran Out of Food in the Last Year: Sometimes true  Transportation Needs: No Transportation Needs (12/12/2022)   PRAPARE - Administrator, Civil Service (Medical): No    Lack of Transportation (Non-Medical): No  Physical Activity: Insufficiently Active (12/06/2021)   Exercise Vital Sign    Days of Exercise per Week: 3 days    Minutes of Exercise per Session: 20 min  Stress: Stress Concern Present (12/06/2021)   [employer] of Occupational Health - Occupational Stress Questionnaire    Feeling of Stress : To some extent  Social Connections: Moderately Integrated (12/06/2021)   Social Connection and Isolation Panel [NHANES]    Frequency of Communication with Friends and Family: More than three times a week    Frequency of Social Gatherings with Friends and Family: More than three times a week    Attends Religious Services: More than 4 times per year    Active Member of [employer] or Organizations: No    Attends Banker Meetings: Never    Marital Status: Married  Catering manager Violence: Not At Risk (12/12/2022)   Humiliation, Afraid, Rape, and Kick questionnaire    Fear of Current or Ex-Partner: No    Emotionally Abused: No    Physically Abused: No    Sexually Abused: No    Outpatient Medications Prior to Visit  Medication Sig Dispense Refill   Accu-Chek Softclix Lancets lancets TEST BLOOD SUGAR TWICE DAILY AS DIRECTED 200 each 10   albuterol (PROVENTIL) (2.5 MG/3ML) 0.083% nebulizer solution Take 3 mLs (2.5 mg total) by  nebulization every 6 (six) hours as needed for wheezing or shortness of breath. 75 mL 5   albuterol (VENTOLIN HFA) 108 (90 Base) MCG/ACT inhaler Inhale 2 puffs into the lungs every 6 (six) hours as needed for wheezing or shortness of breath. 6.7 g 5   Ascorbic Acid (VITAMIN C) 1000 MG tablet Take 1,000 mg by mouth daily.     aspirin EC 81 MG EC tablet  Take 1 tablet (81 mg total) by mouth daily. 30 tablet 0   atorvastatin (LIPITOR) 80 MG tablet TAKE 1 TABLET EVERY DAY 90 tablet 0   betamethasone dipropionate (DIPROLENE) 0.05 % cream Apply topically 2 (two) times daily. 30 g 0   Blood Glucose Monitoring Suppl (ACCU-CHEK GUIDE) w/Device KIT USE AS DIRECTED 1 kit 10   cetirizine (ZYRTEC) 10 MG tablet Take 10 mg by mouth daily.     clonazePAM (KLONOPIN) 1 MG tablet Take 1 tablet (1 mg total) by mouth 2 (two) times daily. 60 tablet 0   DULoxetine (CYMBALTA) 30 MG capsule TAKE 1 CAPSULE EVERY DAY 90 capsule 1   DULoxetine (CYMBALTA) 60 MG capsule TAKE 1 CAPSULE EVERY DAY 90 capsule 3   ezetimibe (ZETIA) 10 MG tablet TAKE 1 TABLET EVERY DAY 90 tablet 0   fluticasone (FLONASE) 50 MCG/ACT nasal spray USE 2 SPRAYS IN EACH NOSTRIL ONE TIME DAILY 48 g 3   fluticasone-salmeterol (ADVAIR) 250-50 MCG/ACT AEPB Inhale 1 puff into the lungs in the morning and at bedtime. 60 each 5   gabapentin (NEURONTIN) 300 MG capsule TAKE 1 CAPSULE AT 6AM, 11:30AM, 4:30PM AND TAKE 3 CAPSULES AT 10PM 540 capsule 3   glucose blood (ONETOUCH VERIO) test strip TEST BLOOD SUGAR THREE TIMES DAILY 300 strip 12   ibuprofen (IBU) 800 MG tablet      Icosapent Ethyl (VASCEPA) 1 g CAPS Take 2 capsules (2 g total) by mouth 2 (two) times daily. 120 capsule 0   ipratropium (ATROVENT) 0.03 % nasal spray PLACE 2 SPRAYS INTO BOTH NOSTRILS EVERY 12 (TWELVE) HOURS. 60 mL 0   isosorbide mononitrate (IMDUR) 30 MG 24 hr tablet Take by mouth.     ketoconazole (NIZORAL) 2 % shampoo Apply topically 2 (two) times a week. 360 mL 1   lisinopril (ZESTRIL)  10 MG tablet Take 0.5 tablets (5 mg total) by mouth daily. 45 tablet 3   meloxicam (MOBIC) 7.5 MG tablet TAKE 1 TABLET (7.5 MG TOTAL) DAILY AS NEEDED FOR PAIN 90 tablet 0   metFORMIN (GLUCOPHAGE-XR) 500 MG 24 hr tablet TAKE 2 TABLETS EVERY DAY WITH BREAKFAST 180 tablet 1   Metoprolol Tartrate 75 MG TABS TAKE 1 TABLET TWICE DAILY 180 tablet 1   montelukast (SINGULAIR) 10 MG tablet TAKE 1 TABLET (10 MG TOTAL) AT BEDTIME 90 tablet 0   Multiple Vitamin (MULTIVITAMIN WITH MINERALS) TABS tablet Take 1 tablet by mouth daily.     nitroGLYCERIN (NITROSTAT) 0.4 MG SL tablet Place 1 tablet (0.4 mg total) under the tongue every 5 (five) minutes as needed for chest pain. 25 tablet 3   pantoprazole (PROTONIX) 40 MG tablet TAKE 1 TABLET EVERY DAY 90 tablet 3   sucralfate (CARAFATE) 1 g tablet Take 1 tablet (1 g total) by mouth in the morning, at noon, and at bedtime. 21 tablet 0   tamsulosin (FLOMAX) 0.4 MG CAPS capsule TAKE 1 CAPSULE EVERY DAY 90 capsule 1   tiZANidine (ZANAFLEX) 2 MG tablet TAKE 1 TABLET EVERY 6 HOURS AS NEEDED FOR MUSCLE SPASM(S) 360 tablet 3   traMADol (ULTRAM) 50 MG tablet Take 1 tablet (50 mg total) by mouth 2 (two) times daily as needed. 60 tablet 5   traZODone (DESYREL) 100 MG tablet TAKE 1 TABLET AT BEDTIME 90 tablet 0   zolpidem (AMBIEN) 10 MG tablet TAKE 1 TABLET AT BEDTIME AS NEEDED FOR SLEEP 30 tablet 0   predniSONE (DELTASONE) 10 MG tablet Take 4 tablets by mouth once daily for  2 days, then 3 tablets daily for 2 days, then 2 tablets daily for 2 days, and then 1 tablet daily for 2 days. 20 tablet 0   No facility-administered medications prior to visit.    Allergies  Allergen Reactions   Diphenhydramine     Other reaction(s): confusion   Diphenhydramine Hcl     Other Reaction(s): Mental Status Changes   Benadryl [Diphenhydramine Hcl]     "drives me nuts"    Review of Systems  Constitutional:  Positive for fever.  HENT:  Positive for congestion and sore throat. Negative  for ear discharge and ear pain.   Eyes:  Negative for pain.  Respiratory:  Positive for cough.   Gastrointestinal:  Positive for diarrhea.  Musculoskeletal:  Positive for myalgias.  Neurological:  Positive for headaches.       Objective:    Physical Exam Constitutional:      Appearance: Normal appearance.  HENT:     Head: Normocephalic.     Mouth/Throat:     Mouth: Mucous membranes are moist.  Cardiovascular:     Rate and Rhythm: Normal rate and regular rhythm.     Pulses: Normal pulses.     Heart sounds: Normal heart sounds.  Pulmonary:     Breath sounds: Wheezing present.  Musculoskeletal:     Cervical back: Normal range of motion.  Skin:    General: Skin is warm.  Neurological:     General: No focal deficit present.     Mental Status: He is alert. Mental status is at baseline.      BP 111/72 (BP Location: Right Arm, Patient Position: Sitting, Cuff Size: Small)   Pulse 87   Temp 98.5 F (36.9 C) (Oral)   Resp 16   Wt 198 lb (89.8 kg)   SpO2 99%   BMI 30.11 kg/m  Wt Readings from Last 3 Encounters:  04/25/23 198 lb (89.8 kg)  03/21/23 201 lb (91.2 kg)  03/10/23 196 lb (88.9 kg)       Assessment & Plan:   Problem List Items Addressed This Visit       Unprioritized   Type 2 diabetes, controlled, with peripheral neuropathy (HCC)    Keep a close eye with your blood sugar reading while your sick with Covid. But continue with Metformin medication      Essential hypertension - Primary    Continue Lisinopril 5mg .       COVID-19    Sent in Paxlovid to your pharmacy  for Covid infection. Start this medication tomorrow given your recent dose of Advair. Please hold off on starting Paxlovid until tomorrow 04/26/2023.      Relevant Medications   nirmatrelvir & ritonavir (PAXLOVID, 300/100,) 20 x 150 MG & 10 x 100MG  TBPK   Other Relevant Orders   POC COVID-19 (Completed)   Asthma    Continue with the Albuterol to help with your asthma symptoms. Please hold of  on the Advair while you are taking Paxlovid. Hold off for 24hrs before restarting your Advair once you have completed Paxlovid.       Relevant Medications   predniSONE (DELTASONE) 10 MG tablet    I am having Jiovanni Blum "Forest" start on Paxlovid (300/100). I am also having him maintain his cetirizine, multivitamin with minerals, vitamin C, aspirin EC, icosapent Ethyl, betamethasone dipropionate, ketoconazole, sucralfate, ibuprofen, isosorbide mononitrate, Accu-Chek Guide, Accu-Chek Softclix Lancets, gabapentin, ipratropium, fluticasone, pantoprazole, metFORMIN, DULoxetine, tamsulosin, tiZANidine, OneTouch Verio, traMADol, nitroGLYCERIN, Metoprolol Tartrate, atorvastatin, ezetimibe, montelukast, traZODone,  meloxicam, zolpidem, DULoxetine, clonazePAM, albuterol, albuterol, fluticasone-salmeterol, lisinopril, and predniSONE.  Meds ordered this encounter  Medications   nirmatrelvir & ritonavir (PAXLOVID, 300/100,) 20 x 150 MG & 10 x 100MG  TBPK    Sig: Take 3 tablets by mouth twice a day for 5 days.  Stop advair while taking    Dispense:  30 tablet    Refill:  0    Order Specific Question:   Supervising Provider    Answer:   Danise Edge A [[id number]]   predniSONE (DELTASONE) 10 MG tablet    Sig: Take 4 tablets by mouth once daily for 2 days, then 3 tablets daily for 2 days, then 2 tablets daily for 2 days, and then 1 tablet daily for 2 days.    Dispense:  20 tablet    Refill:  0    Order Specific Question:   Supervising Provider    Answer:   Danise Edge A [[id number]]

## 2023-04-25 NOTE — Assessment & Plan Note (Signed)
Keep a close eye with your blood sugar reading while your sick with Covid. But continue with Metformin medication

## 2023-04-25 NOTE — Assessment & Plan Note (Signed)
-   Continue Lisinopril 5 mg.

## 2023-04-25 NOTE — Assessment & Plan Note (Signed)
Sent in Paxlovid to your pharmacy  for Covid infection. Start this medication tomorrow given your recent dose of Advair. Please hold off on starting Paxlovid until tomorrow 04/26/2023.

## 2023-04-29 ENCOUNTER — Other Ambulatory Visit: Payer: Self-pay | Admitting: Family

## 2023-04-29 DIAGNOSIS — M545 Low back pain, unspecified: Secondary | ICD-10-CM

## 2023-04-30 ENCOUNTER — Other Ambulatory Visit: Payer: Self-pay | Admitting: Family

## 2023-06-03 ENCOUNTER — Other Ambulatory Visit: Payer: Self-pay | Admitting: Adult Health

## 2023-06-05 ENCOUNTER — Telehealth: Payer: Self-pay | Admitting: Family

## 2023-06-05 ENCOUNTER — Other Ambulatory Visit (HOSPITAL_BASED_OUTPATIENT_CLINIC_OR_DEPARTMENT_OTHER): Payer: Self-pay

## 2023-06-05 ENCOUNTER — Other Ambulatory Visit: Payer: Self-pay

## 2023-06-05 MED ORDER — LISINOPRIL 10 MG PO TABS
5.0000 mg | ORAL_TABLET | Freq: Every day | ORAL | 3 refills | Status: DC
  Filled 2023-06-05 – 2023-06-06 (×2): qty 45, 90d supply, fill #0

## 2023-06-05 NOTE — Telephone Encounter (Signed)
Rx sent to [facility] pharmacy as requested

## 2023-06-05 NOTE — Telephone Encounter (Signed)
Pt having issues getting his lisinopril 10 mg from the mail order pharmacy he uses so he would like to have a prescription sent to pharmacy downstairs

## 2023-06-06 ENCOUNTER — Other Ambulatory Visit (HOSPITAL_BASED_OUTPATIENT_CLINIC_OR_DEPARTMENT_OTHER): Payer: Self-pay

## 2023-06-06 ENCOUNTER — Other Ambulatory Visit: Payer: Self-pay

## 2023-06-09 ENCOUNTER — Other Ambulatory Visit (HOSPITAL_BASED_OUTPATIENT_CLINIC_OR_DEPARTMENT_OTHER): Payer: Self-pay

## 2023-06-15 ENCOUNTER — Other Ambulatory Visit: Payer: Self-pay

## 2023-06-15 ENCOUNTER — Encounter: Payer: Self-pay | Admitting: Family

## 2023-06-15 ENCOUNTER — Other Ambulatory Visit (HOSPITAL_BASED_OUTPATIENT_CLINIC_OR_DEPARTMENT_OTHER): Payer: Self-pay

## 2023-06-15 MED ORDER — CLONAZEPAM 1 MG PO TABS
1.0000 mg | ORAL_TABLET | Freq: Two times a day (BID) | ORAL | 0 refills | Status: DC
Start: 2023-06-15 — End: 2023-07-24
  Filled 2023-06-15: qty 60, 30d supply, fill #0

## 2023-06-15 MED ORDER — METOPROLOL TARTRATE 75 MG PO TABS
ORAL_TABLET | ORAL | 1 refills | Status: DC
  Filled 2023-06-15: qty 180, 90d supply, fill #0

## 2023-06-15 NOTE — Telephone Encounter (Signed)
Requesting: clonazepam 1mg  Contract:  03/10/23 UDS: 03/10/23 Last Visit:  04/25/23 Next Visit: 07/10/23 Last Refill: 03/10/23 #60 and 0RF   Please Advise

## 2023-06-16 ENCOUNTER — Other Ambulatory Visit (HOSPITAL_BASED_OUTPATIENT_CLINIC_OR_DEPARTMENT_OTHER): Payer: Self-pay

## 2023-06-23 DIAGNOSIS — R051 Acute cough: Secondary | ICD-10-CM | POA: Diagnosis not present

## 2023-06-23 DIAGNOSIS — J4541 Moderate persistent asthma with (acute) exacerbation: Secondary | ICD-10-CM | POA: Diagnosis not present

## 2023-06-26 ENCOUNTER — Telehealth: Payer: Self-pay | Admitting: Family

## 2023-06-26 DIAGNOSIS — I719 Aortic aneurysm of unspecified site, without rupture: Secondary | ICD-10-CM

## 2023-06-26 DIAGNOSIS — I7122 Aneurysm of the aortic arch, without rupture: Secondary | ICD-10-CM

## 2023-06-26 NOTE — Telephone Encounter (Signed)
Pt is due for follow up CT to recheck his mildly enlarged aorta.  Needs bmet completed prior.

## 2023-06-26 NOTE — Telephone Encounter (Signed)
Patient advised is time for follow up CT, he will have to call back to set up lab appointment due to transportation issues. He will set up appointment for CT after that.

## 2023-06-29 ENCOUNTER — Other Ambulatory Visit: Payer: Self-pay | Admitting: Registered Nurse

## 2023-06-30 NOTE — Telephone Encounter (Signed)
Call placed to Mr. Macdonnell, no answer. Called twice to see what pharmacy the Tramadol should be sent to.  He was also instructed to Move his F/U appointment from November to October.

## 2023-07-05 ENCOUNTER — Encounter: Payer: Self-pay | Admitting: Family

## 2023-07-09 DIAGNOSIS — J40 Bronchitis, not specified as acute or chronic: Secondary | ICD-10-CM | POA: Diagnosis not present

## 2023-07-09 DIAGNOSIS — R051 Acute cough: Secondary | ICD-10-CM | POA: Diagnosis not present

## 2023-07-10 ENCOUNTER — Telehealth: Payer: Self-pay | Admitting: Registered Nurse

## 2023-07-10 ENCOUNTER — Other Ambulatory Visit: Payer: Self-pay | Admitting: Family

## 2023-07-10 MED ORDER — TRAMADOL HCL 50 MG PO TABS: 50.0000 mg | ORAL_TABLET | Freq: Two times a day (BID) | ORAL | 0 refills | Status: DC | PRN

## 2023-07-10 NOTE — Telephone Encounter (Signed)
SEND TO [facility] PHONE NUMBER [phone]

## 2023-07-10 NOTE — Telephone Encounter (Signed)
PMP was Reviewed.  Tramadol e-scribed to pharmacy.  My- Chart message sent to pharmacy

## 2023-07-11 ENCOUNTER — Ambulatory Visit: Payer: Medicare HMO | Admitting: Family

## 2023-07-12 ENCOUNTER — Other Ambulatory Visit: Payer: Self-pay | Admitting: Family

## 2023-07-14 ENCOUNTER — Encounter: Payer: Medicare HMO | Admitting: Registered Nurse

## 2023-07-14 NOTE — Progress Notes (Deleted)
Subjective:    Patient ID: Cody Raymond, male    DOB: [birth year], 52 y.o.   MRN: [id number]  HPI Pain Inventory Average Pain {NUMBERS; 0-10:5044} Pain Right Now {NUMBERS; 0-10:5044} My pain is {PAIN DESCRIPTION:21022940}  In the last 24 hours, has pain interfered with the following? General activity {NUMBERS; 0-10:5044} Relation with others {NUMBERS; 0-10:5044} Enjoyment of life {NUMBERS; 0-10:5044} What TIME of day is your pain at its worst? {time of day:24191} Sleep (in general) {BHH GOOD/FAIR/POOR:22877}  Pain is worse with: {ACTIVITIES:21022942} Pain improves with: {PAIN IMPROVES QION:62952841} Relief from Meds: {NUMBERS; 0-10:5044}  Family History  Problem Relation Age of Onset  . Colon cancer Paternal Grandfather   . Prostate cancer Paternal Grandfather   . Aneurysm Father   . Heart attack Father 65  . Coronary artery disease Father   . Colon polyps Mother   . Heart disease Mother   . Coronary artery disease Mother   . Migraines Mother   . Breast cancer Maternal Grandmother   . Colon cancer Paternal Uncle        x 2  . Stomach cancer Brother   . Liver disease Other        unsure who it was  . Allergies Daughter    Social History   Socioeconomic History  . Marital status: Married    Spouse name: Not on file  . Number of children: Not on file  . Years of education: Not on file  . Highest education level: Not on file  Occupational History  . Not on file  Tobacco Use  . Smoking status: Never  . Smokeless tobacco: Never  Vaping Use  . Vaping status: Never Used  Substance and Sexual Activity  . Alcohol use: No  . Drug use: No  . Sexual activity: Yes  Other Topics Concern  . Not on file  Social History Narrative   ** Merged History Encounter **       Holter monitor 08/2010: PVCs and sinus tachy.   Sleep Study (02/2008): mild sleep apnea, no indication for CPAP.   Social Determinants of Health   Financial Resource Strain: Medium Risk (12/06/2021)    Overall Financial Resource Strain (CARDIA)   . Difficulty of Paying Living Expenses: Somewhat hard  Food Insecurity: Food Insecurity Present (12/12/2022)   Hunger Vital Sign   . Worried About Programme researcher, broadcasting/film/video in the Last Year: Often true   . Ran Out of Food in the Last Year: Sometimes true  Transportation Needs: No Transportation Needs (12/12/2022)   PRAPARE - Transportation   . Lack of Transportation (Medical): No   . Lack of Transportation (Non-Medical): No  Physical Activity: Insufficiently Active (12/06/2021)   Exercise Vital Sign   . Days of Exercise per Week: 3 days   . Minutes of Exercise per Session: 20 min  Stress: Stress Concern Present (12/06/2021)   [employer] of Occupational Health - Occupational Stress Questionnaire   . Feeling of Stress : To some extent  Social Connections: Moderately Integrated (12/06/2021)   Social Connection and Isolation Panel [NHANES]   . Frequency of Communication with Friends and Family: More than three times a week   . Frequency of Social Gatherings with Friends and Family: More than three times a week   . Attends Religious Services: More than 4 times per year   . Active Member of Clubs or Organizations: No   . Attends Banker Meetings: Never   . Marital Status: Married   Past Surgical  History:  Procedure Laterality Date  . APPENDECTOMY    . BACK SURGERY    . CARDIAC CATHETERIZATION  2007   [facility] Card[facility]), with RCA 30% narrowing likely due to catheter induced spasm in 09/02/10.  Marland Kitchen CARDIAC CATHETERIZATION  09/02/2010   mod. nonobstructive disease in the RCA and CX, tortuous LAD  . COLONOSCOPY W/ POLYPECTOMY  03/17/11   diminutive polyp  . LOOP RECORDER EXPLANT N/A 01/14/2014   Procedure: LOOP RECORDER EXPLANT;  Surgeon: Thurmon Fair, MD;  Location: [facility]ATH [facility];  Service: Cardiovascular;  Laterality: N/A;  . LOOP RECORDER IMPLANT  04/06/2012   Reveal XT [id number]  . LOOP RECORDER IMPLANT N/A  04/06/2012   Procedure: LOOP RECORDER IMPLANT;  Surgeon: Thurmon Fair, MD;  Location: [facility]ATH [facility];  Service: Cardiovascular;  Laterality: N/A;  . NM MYOCAR PERF WALL MOTION  01/25/2008   mild anteroapical wall ischemia  . PATENT DUCTUS ARTERIOUS REPAIR     at age 80   Past Surgical History:  Procedure Laterality Date  . APPENDECTOMY    . BACK SURGERY    . CARDIAC CATHETERIZATION  2007   Clean Cardiac Cath [facility] Cards), with RCA 30% narrowing likely due to catheter induced spasm in 09/02/10.  Marland Kitchen CARDIAC CATHETERIZATION  09/02/2010   mod. nonobstructive disease in the RCA and CX, tortuous LAD  . COLONOSCOPY W/ POLYPECTOMY  03/17/11   diminutive polyp  . LOOP RECORDER EXPLANT N/A 01/14/2014   Procedure: LOOP RECORDER EXPLANT;  Surgeon: Thurmon Fair, MD;  Location: [facility];  Service: Cardiovascular;  Laterality: N/A;  . LOOP RECORDER IMPLANT  04/06/2012   Reveal XT 7425  . LOOP RECORDER IMPLANT N/A 04/06/2012   Procedure: LOOP RECORDER IMPLANT;  Surgeon: Thurmon Fair, MD;  Location: [facility];  Service: Cardiovascular;  Laterality: N/A;  . NM MYOCAR PERF WALL MOTION  01/25/2008   mild anteroapical wall ischemia  . PATENT DUCTUS ARTERIOUS REPAIR     at age 80   Past Medical History:  Diagnosis Date  . Aneurysm (HCC)   . Asthma   . Blood transfusion   . CAD (coronary artery disease), non obstructive on cath 2011 04/05/2012  . Coronary artery disease   . Diabetes mellitus   . Diabetes mellitus without complication (HCC)   . Enlarged prostate   . GERD (gastroesophageal reflux disease)   . H/O syncope   . Heart disease   . History of repair of patent ductus arteriosus 07/23/2016  . Hyperlipidemia   . Hypertension   . 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 Refusal of blood transfusions as patient is Jehovah's Witness    There were no vitals taken for this visit.  Opioid Risk Score:   Fall Risk Score:  `1  Depression screen [facility] 2/9     03/10/2023    4:39 PM  02/17/2023    3:17 PM 12/29/2022    2:29 PM 12/21/2022    1:05 PM 12/12/2022    3:07 PM 07/22/2022    8:27 AM 12/28/2021   10:27 AM  Depression screen PHQ 2/9  Decreased Interest 2 1 2  0 1 0 2  Down, Depressed, Hopeless 2 1 1  0 1 0 2  PHQ - 2 Score 4 2 3  0 2 0 4  Altered sleeping 2      2  Tired, decreased energy 2      2  Change in appetite 2      1  Feeling bad  or failure about yourself  2      1  Trouble concentrating 2      1  Moving slowly or fidgety/restless 0      0  Suicidal thoughts 0      0  PHQ-9 Score 14      11  Difficult doing work/chores Very difficult            Review of Systems     Objective:   Physical Exam        Assessment & Plan:

## 2023-07-20 ENCOUNTER — Encounter: Payer: Medicare HMO | Admitting: Registered Nurse

## 2023-07-20 NOTE — Progress Notes (Deleted)
Subjective:    Patient ID: Cody Raymond, male    DOB: [birth year], 52 y.o.   MRN: [id number]  HPI Pain Inventory Average Pain {NUMBERS; 0-10:5044} Pain Right Now {NUMBERS; 0-10:5044} My pain is {PAIN DESCRIPTION:21022940}  In the last 24 hours, has pain interfered with the following? General activity {NUMBERS; 0-10:5044} Relation with others {NUMBERS; 0-10:5044} Enjoyment of life {NUMBERS; 0-10:5044} What TIME of day is your pain at its worst? {time of day:24191} Sleep (in general) {BHH GOOD/FAIR/POOR:22877}  Pain is worse with: {ACTIVITIES:21022942} Pain improves with: {PAIN IMPROVES QION:62952841} Relief from Meds: {NUMBERS; 0-10:5044}  Family History  Problem Relation Age of Onset  . Colon cancer Paternal Grandfather   . Prostate cancer Paternal Grandfather   . Aneurysm Father   . Heart attack Father 65  . Coronary artery disease Father   . Colon polyps Mother   . Heart disease Mother   . Coronary artery disease Mother   . Migraines Mother   . Breast cancer Maternal Grandmother   . Colon cancer Paternal Uncle        x 2  . Stomach cancer Brother   . Liver disease Other        unsure who it was  . Allergies Daughter    Social History   Socioeconomic History  . Marital status: Married    Spouse name: Not on file  . Number of children: Not on file  . Years of education: Not on file  . Highest education level: Not on file  Occupational History  . Not on file  Tobacco Use  . Smoking status: Never  . Smokeless tobacco: Never  Vaping Use  . Vaping status: Never Used  Substance and Sexual Activity  . Alcohol use: No  . Drug use: No  . Sexual activity: Yes  Other Topics Concern  . Not on file  Social History Narrative   ** Merged History Encounter **       Holter monitor 08/2010: PVCs and sinus tachy.   Sleep Study (02/2008): mild sleep apnea, no indication for CPAP.   Social Determinants of Health   Financial Resource Strain: Medium Risk (12/06/2021)    Overall Financial Resource Strain (CARDIA)   . Difficulty of Paying Living Expenses: Somewhat hard  Food Insecurity: Food Insecurity Present (12/12/2022)   Hunger Vital Sign   . Worried About Programme researcher, broadcasting/film/video in the Last Year: Often true   . Ran Out of Food in the Last Year: Sometimes true  Transportation Needs: No Transportation Needs (12/12/2022)   PRAPARE - Transportation   . Lack of Transportation (Medical): No   . Lack of Transportation (Non-Medical): No  Physical Activity: Insufficiently Active (12/06/2021)   Exercise Vital Sign   . Days of Exercise per Week: 3 days   . Minutes of Exercise per Session: 20 min  Stress: Stress Concern Present (12/06/2021)   [employer] of Occupational Health - Occupational Stress Questionnaire   . Feeling of Stress : To some extent  Social Connections: Moderately Integrated (12/06/2021)   Social Connection and Isolation Panel [NHANES]   . Frequency of Communication with Friends and Family: More than three times a week   . Frequency of Social Gatherings with Friends and Family: More than three times a week   . Attends Religious Services: More than 4 times per year   . Active Member of Clubs or Organizations: No   . Attends Banker Meetings: Never   . Marital Status: Married   Past Surgical  History:  Procedure Laterality Date  . APPENDECTOMY    . BACK SURGERY    . CARDIAC CATHETERIZATION  2007   [facility] Card[facility]), with RCA 30% narrowing likely due to catheter induced spasm in 09/02/10.  Marland Kitchen CARDIAC CATHETERIZATION  09/02/2010   mod. nonobstructive disease in the RCA and CX, tortuous LAD  . COLONOSCOPY W/ POLYPECTOMY  03/17/11   diminutive polyp  . LOOP RECORDER EXPLANT N/A 01/14/2014   Procedure: LOOP RECORDER EXPLANT;  Surgeon: Thurmon Fair, MD;  Location: [facility]ATH [facility];  Service: Cardiovascular;  Laterality: N/A;  . LOOP RECORDER IMPLANT  04/06/2012   Reveal XT [id number]  . LOOP RECORDER IMPLANT N/A  04/06/2012   Procedure: LOOP RECORDER IMPLANT;  Surgeon: Thurmon Fair, MD;  Location: [facility]ATH [facility];  Service: Cardiovascular;  Laterality: N/A;  . NM MYOCAR PERF WALL MOTION  01/25/2008   mild anteroapical wall ischemia  . PATENT DUCTUS ARTERIOUS REPAIR     at age 80   Past Surgical History:  Procedure Laterality Date  . APPENDECTOMY    . BACK SURGERY    . CARDIAC CATHETERIZATION  2007   Clean Cardiac Cath [facility] Cards), with RCA 30% narrowing likely due to catheter induced spasm in 09/02/10.  Marland Kitchen CARDIAC CATHETERIZATION  09/02/2010   mod. nonobstructive disease in the RCA and CX, tortuous LAD  . COLONOSCOPY W/ POLYPECTOMY  03/17/11   diminutive polyp  . LOOP RECORDER EXPLANT N/A 01/14/2014   Procedure: LOOP RECORDER EXPLANT;  Surgeon: Thurmon Fair, MD;  Location: [facility];  Service: Cardiovascular;  Laterality: N/A;  . LOOP RECORDER IMPLANT  04/06/2012   Reveal XT 7425  . LOOP RECORDER IMPLANT N/A 04/06/2012   Procedure: LOOP RECORDER IMPLANT;  Surgeon: Thurmon Fair, MD;  Location: [facility];  Service: Cardiovascular;  Laterality: N/A;  . NM MYOCAR PERF WALL MOTION  01/25/2008   mild anteroapical wall ischemia  . PATENT DUCTUS ARTERIOUS REPAIR     at age 80   Past Medical History:  Diagnosis Date  . Aneurysm (HCC)   . Asthma   . Blood transfusion   . CAD (coronary artery disease), non obstructive on cath 2011 04/05/2012  . Coronary artery disease   . Diabetes mellitus   . Diabetes mellitus without complication (HCC)   . Enlarged prostate   . GERD (gastroesophageal reflux disease)   . H/O syncope   . Heart disease   . History of repair of patent ductus arteriosus 07/23/2016  . Hyperlipidemia   . Hypertension   . 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 Refusal of blood transfusions as patient is Jehovah's Witness    There were no vitals taken for this visit.  Opioid Risk Score:   Fall Risk Score:  `1  Depression screen [facility] 2/9     03/10/2023    4:39 PM  02/17/2023    3:17 PM 12/29/2022    2:29 PM 12/21/2022    1:05 PM 12/12/2022    3:07 PM 07/22/2022    8:27 AM 12/28/2021   10:27 AM  Depression screen PHQ 2/9  Decreased Interest 2 1 2  0 1 0 2  Down, Depressed, Hopeless 2 1 1  0 1 0 2  PHQ - 2 Score 4 2 3  0 2 0 4  Altered sleeping 2      2  Tired, decreased energy 2      2  Change in appetite 2      1  Feeling bad  or failure about yourself  2      1  Trouble concentrating 2      1  Moving slowly or fidgety/restless 0      0  Suicidal thoughts 0      0  PHQ-9 Score 14      11  Difficult doing work/chores Very difficult            Review of Systems     Objective:   Physical Exam        Assessment & Plan:

## 2023-07-21 ENCOUNTER — Telehealth (HOSPITAL_BASED_OUTPATIENT_CLINIC_OR_DEPARTMENT_OTHER): Payer: Self-pay

## 2023-07-21 ENCOUNTER — Ambulatory Visit: Payer: Medicare HMO | Admitting: Family

## 2023-07-24 ENCOUNTER — Ambulatory Visit (INDEPENDENT_AMBULATORY_CARE_PROVIDER_SITE_OTHER): Payer: Medicare HMO | Admitting: Family

## 2023-07-24 ENCOUNTER — Other Ambulatory Visit (HOSPITAL_BASED_OUTPATIENT_CLINIC_OR_DEPARTMENT_OTHER): Payer: Self-pay

## 2023-07-24 VITALS — BP 106/71 | HR 79 | Temp 98.7°F | Resp 16 | Wt 197.0 lb

## 2023-07-24 DIAGNOSIS — I10 Essential (primary) hypertension: Secondary | ICD-10-CM

## 2023-07-24 DIAGNOSIS — I7121 Aneurysm of the ascending aorta, without rupture: Secondary | ICD-10-CM | POA: Diagnosis not present

## 2023-07-24 DIAGNOSIS — Z7984 Long term (current) use of oral hypoglycemic drugs: Secondary | ICD-10-CM

## 2023-07-24 DIAGNOSIS — M797 Fibromyalgia: Secondary | ICD-10-CM | POA: Diagnosis not present

## 2023-07-24 DIAGNOSIS — G4700 Insomnia, unspecified: Secondary | ICD-10-CM

## 2023-07-24 DIAGNOSIS — Z23 Encounter for immunization: Secondary | ICD-10-CM | POA: Diagnosis not present

## 2023-07-24 DIAGNOSIS — J4540 Moderate persistent asthma, uncomplicated: Secondary | ICD-10-CM

## 2023-07-24 DIAGNOSIS — E1142 Type 2 diabetes mellitus with diabetic polyneuropathy: Secondary | ICD-10-CM | POA: Diagnosis not present

## 2023-07-24 DIAGNOSIS — F418 Other specified anxiety disorders: Secondary | ICD-10-CM

## 2023-07-24 MED ORDER — LISINOPRIL 10 MG PO TABS
10.0000 mg | ORAL_TABLET | Freq: Every day | ORAL | 1 refills | Status: DC
  Filled 2023-07-24 (×2): qty 90, 90d supply, fill #0

## 2023-07-24 MED ORDER — CLONAZEPAM 1 MG PO TABS
1.0000 mg | ORAL_TABLET | Freq: Two times a day (BID) | ORAL | 0 refills | Status: DC
  Filled 2023-07-24: qty 60, 30d supply, fill #0

## 2023-07-24 MED ORDER — LISINOPRIL 10 MG PO TABS: 10.0000 mg | ORAL_TABLET | Freq: Every day | ORAL | Status: DC

## 2023-07-24 NOTE — Progress Notes (Signed)
Subjective:     Patient ID: Cody Raymond, male    DOB: Oct 21, 1970, 52 y.o.   MRN: [id number]  Chief Complaint  Patient presents with   Diabetes    Here for follow up   Hypertension    Here for follow up   Anxiety    Needs refill for clonazepam last uds and contract June 2024    Diabetes  Hypertension Associated symptoms include anxiety.  Anxiety      Discussed the use of AI scribe software for clinical note transcription with the patient, who gave verbal consent to proceed.  History of Present Illness   The patient, with a history of asthma, diabetes, and hypertension, presents with increased stress, anxiety, and depression due to multiple personal and family issues. The patient reports ongoing family disputes, financial difficulties, and health concerns of family members. The patient also reports congestion in the lungs and sinus pain, which have been persistent despite antibiotic treatment. The patient has been adhering to their medication regimen, which includes Lisinopril for hypertension, Metformin for diabetes, and Advair for asthma. The patient is also on Cymbalta and Clonazepam for mood regulation and anxiety, respectively. The patient has been taking Ambien for sleep but wishes to discontinue due to experiencing a foggy feeling upon waking and it not aiding in sleep.     BP Readings from Last 3 Encounters:  07/24/23 106/71  04/25/23 111/72  03/21/23 104/62        Health Maintenance Due  Topic Date Due   Zoster Vaccines- Shingrix (1 of 2) Never done   DTaP/Tdap/Td (2 - Td or Tdap) 04/04/2022   OPHTHALMOLOGY EXAM  04/08/2023   COVID-19 Vaccine (4 - 2023-24 season) 05/21/2023    Past Medical History:  Diagnosis Date   Aneurysm (HCC)    Asthma    Blood transfusion    CAD (coronary artery disease), non obstructive on cath 2011 04/05/2012   Coronary artery disease    Diabetes mellitus    Diabetes mellitus without complication (HCC)    Enlarged prostate     GERD (gastroesophageal reflux disease)    H/O syncope    Heart disease    History of repair of patent ductus arteriosus 07/23/2016   Hyperlipidemia    Hypertension    Neuromuscular disorder (HCC)    DJD   Refusal of blood transfusions as patient is Jehovah's Witness     Past Surgical History:  Procedure Laterality Date   APPENDECTOMY     BACK SURGERY     CARDIAC CATHETERIZATION  2007   [facility] Cardiac Cath [facility]), with RCA 30% narrowing likely due to catheter induced spasm in 09/02/10.   CARDIAC CATHETERIZATION  09/02/2010   mod. nonobstructive disease in the RCA and CX, tortuous LAD   COLONOSCOPY W/ POLYPECTOMY  03/17/11   diminutive polyp   LOOP RECORDER EXPLANT N/A 01/14/2014   Procedure: LOOP RECORDER EXPLANT;  Surgeon: Thurmon Fair, MD;  Location: [facility] CATH LAB;  Service: Cardiovascular;  Laterality: N/A;   LOOP RECORDER IMPLANT  04/06/2012   Reveal XT [id number]   LOOP RECORDER IMPLANT N/A 04/06/2012   Procedure: LOOP RECORDER IMPLANT;  Surgeon: Thurmon Fair, MD;  Location: [facility] CATH LAB;  Service: Cardiovascular;  Laterality: N/A;   NM MYOCAR PERF WALL MOTION  01/25/2008   mild anteroapical wall ischemia   PATENT DUCTUS ARTERIOUS REPAIR     at age 30    Family History  Problem Relation Age of Onset   Colon cancer Paternal Grandfather  Prostate cancer Paternal Grandfather    Aneurysm Father    Heart attack Father 63   Coronary artery disease Father    Colon polyps Mother    Heart disease Mother    Coronary artery disease Mother    Migraines Mother    Breast cancer Maternal Grandmother    Colon cancer Paternal Uncle        x 2   Stomach cancer Brother    Liver disease Other        unsure who it was   Allergies Daughter     Social History   Socioeconomic History   Marital status: Married    Spouse name: Not on file   Number of children: Not on file   Years of education: Not on file   Highest education level: Not on file  Occupational History   Not  on file  Tobacco Use   Smoking status: Never   Smokeless tobacco: Never  Vaping Use   Vaping status: Never Used  Substance and Sexual Activity   Alcohol use: No   Drug use: No   Sexual activity: Yes  Other Topics Concern   Not on file  Social History Narrative   ** Merged History Encounter **       Holter monitor 08/2010: PVCs and sinus tachy.   Sleep Study (02/2008): mild sleep apnea, no indication for CPAP.   Social Determinants of Health   Financial Resource Strain: Medium Risk (12/06/2021)   Overall Financial Resource Strain (CARDIA)    Difficulty of Paying Living Expenses: Somewhat hard  Food Insecurity: Food Insecurity Present (12/12/2022)   Hunger Vital Sign    Worried About Running Out of Food in the Last Year: Often true    Ran Out of Food in the Last Year: Sometimes true  Transportation Needs: No Transportation Needs (12/12/2022)   PRAPARE - Administrator, Civil Service (Medical): No    Lack of Transportation (Non-Medical): No  Physical Activity: Insufficiently Active (12/06/2021)   Exercise Vital Sign    Days of Exercise per Week: 3 days    Minutes of Exercise per Session: 20 min  Stress: Stress Concern Present (12/06/2021)   [employer] of Occupational Health - Occupational Stress Questionnaire    Feeling of Stress : To some extent  Social Connections: Moderately Integrated (12/06/2021)   Social Connection and Isolation Panel [NHANES]    Frequency of Communication with Friends and Family: More than three times a week    Frequency of Social Gatherings with Friends and Family: More than three times a week    Attends Religious Services: More than 4 times per year    Active Member of [employer] or Organizations: No    Attends Banker Meetings: Never    Marital Status: Married  Catering manager Violence: Not At Risk (12/12/2022)   Humiliation, Afraid, Rape, and Kick questionnaire    Fear of Current or Ex-Partner: No    Emotionally Abused: No     Physically Abused: No    Sexually Abused: No    Outpatient Medications Prior to Visit  Medication Sig Dispense Refill   Accu-Chek Softclix Lancets lancets TEST BLOOD SUGAR TWO TIMES DAILY AS DIRECTED 200 each 3   albuterol (PROVENTIL) (2.5 MG/3ML) 0.083% nebulizer solution Take 3 mLs (2.5 mg total) by nebulization every 6 (six) hours as needed for wheezing or shortness of breath. 75 mL 5   albuterol (VENTOLIN HFA) 108 (90 Base) MCG/ACT inhaler Inhale 2 puffs into the  lungs every 6 (six) hours as needed for wheezing or shortness of breath. 6.7 g 5   Ascorbic Acid (VITAMIN C) 1000 MG tablet Take 1,000 mg by mouth daily.     aspirin EC 81 MG EC tablet Take 1 tablet (81 mg total) by mouth daily. 30 tablet 0   atorvastatin (LIPITOR) 80 MG tablet Take 1 tablet by mouth daily.     betamethasone dipropionate (DIPROLENE) 0.05 % cream Apply topically 2 (two) times daily. 30 g 0   Blood Glucose Monitoring Suppl (ACCU-CHEK GUIDE) w/Device KIT USE AS DIRECTED 1 kit 10   cetirizine (ZYRTEC) 10 MG tablet Take 10 mg by mouth daily.     DULoxetine (CYMBALTA) 30 MG capsule TAKE 1 CAPSULE EVERY DAY 90 capsule 1   DULoxetine (CYMBALTA) 60 MG capsule Take 1 capsule by mouth daily.     ezetimibe (ZETIA) 10 MG tablet Take 1 tablet by mouth daily.     fluticasone (FLONASE) 50 MCG/ACT nasal spray USE 2 SPRAYS IN EACH NOSTRIL ONE TIME DAILY 48 g 3   fluticasone-salmeterol (ADVAIR) 250-50 MCG/ACT AEPB Inhale 1 puff into the lungs in the morning and at bedtime. 60 each 5   gabapentin (NEURONTIN) 300 MG capsule TAKE 1 CAPSULE AT 6AM, 11:30AM, 4:30PM AND TAKE 3 CAPSULES AT 10PM 540 capsule 0   glucose blood (ONETOUCH VERIO) test strip TEST BLOOD SUGAR THREE TIMES DAILY 300 strip 12   glucose blood (ONETOUCH VERIO) test strip      ibuprofen (IBU) 800 MG tablet      Icosapent Ethyl (VASCEPA) 1 g CAPS Take 2 capsules (2 g total) by mouth 2 (two) times daily. 120 capsule 0   ipratropium (ATROVENT) 0.03 % nasal spray  Place into the nose.     ipratropium-albuterol (DUONEB) 0.5-2.5 (3) MG/3ML SOLN Inhale into the lungs.     isosorbide mononitrate (IMDUR) 30 MG 24 hr tablet Take by mouth.     ketoconazole (NIZORAL) 2 % shampoo Apply topically 2 (two) times a week. 360 mL 1   meloxicam (MOBIC) 7.5 MG tablet TAKE 1 TABLET DAILY AS NEEDED FOR PAIN 90 tablet 1   metFORMIN (GLUCOPHAGE-XR) 500 MG 24 hr tablet TAKE 2 TABLETS EVERY DAY WITH BREAKFAST 180 tablet 1   Metoprolol Tartrate 75 MG TABS Take by mouth.     montelukast (SINGULAIR) 10 MG tablet Take 1 tablet by mouth at bedtime.     Multiple Vitamin (MULTIVITAMIN WITH MINERALS) TABS tablet Take 1 tablet by mouth daily.     nitroGLYCERIN (NITROSTAT) 0.4 MG SL tablet DISSOLVE 1 TABLET UNDER THE TONGUE EVERY 5 MINUTES AS NEEDED FOR CHEST PAIN 25 tablet 11   pantoprazole (PROTONIX) 40 MG tablet Take 1 tablet by mouth daily.     predniSONE (DELTASONE) 10 MG tablet Take 4 tablets by mouth once daily for 2 days, then 3 tablets daily for 2 days, then 2 tablets daily for 2 days, and then 1 tablet daily for 2 days. 20 tablet 0   predniSONE (DELTASONE) 20 MG tablet Take 20 mg by mouth 2 (two) times daily.     sucralfate (CARAFATE) 1 g tablet Take 1 tablet (1 g total) by mouth in the morning, at noon, and at bedtime. 21 tablet 0   tamsulosin (FLOMAX) 0.4 MG CAPS capsule Take 1 capsule by mouth daily.     tiZANidine (ZANAFLEX) 2 MG tablet TAKE 1 TABLET EVERY 6 HOURS AS NEEDED FOR MUSCLE SPASM(S) 360 tablet 3   traMADol (ULTRAM) 50 MG tablet  Take 1 tablet (50 mg total) by mouth 2 (two) times daily as needed. 60 tablet 0   traZODone (DESYREL) 100 MG tablet Take 1 tablet by mouth at bedtime.     clonazePAM (KLONOPIN) 1 MG tablet Take 1 tablet (1 mg total) by mouth 2 (two) times daily. 60 tablet 0   lisinopril (ZESTRIL) 10 MG tablet Take 0.5 tablets (5 mg total) by mouth daily. 45 tablet 3   zolpidem (AMBIEN) 10 MG tablet TAKE 1 TABLET AT BEDTIME AS NEEDED FOR SLEEP 30 tablet 0    No facility-administered medications prior to visit.    Allergies  Allergen Reactions   Diphenhydramine     Other reaction(s): confusion  Other Reaction(s): Other (See Comments)  "drives me nuts"    Other reaction(s): confusion    Other Reaction(s): Mental Status Changes   Diphenhydramine Hcl     Other Reaction(s): Mental Status Changes   Benadryl [Diphenhydramine Hcl]     "drives me nuts"    ROS See HPI    Objective:    Physical Exam Constitutional:      General: He is not in acute distress.    Appearance: He is well-developed.  HENT:     Head: Normocephalic and atraumatic.  Cardiovascular:     Rate and Rhythm: Normal rate and regular rhythm.     Heart sounds: No murmur heard. Pulmonary:     Effort: Pulmonary effort is normal. No respiratory distress.     Breath sounds: Normal breath sounds. No wheezing or rales.     Comments: Only has upper airway wheeze with forced exhalation otherwise clear bilaterally Skin:    General: Skin is warm and dry.  Neurological:     Mental Status: He is alert and oriented to person, place, and time.  Psychiatric:        Behavior: Behavior normal.        Thought Content: Thought content normal.      BP 106/71 (BP Location: Right Arm, Patient Position: Sitting, Cuff Size: Small)   Pulse 79   Temp 98.7 F (37.1 C) (Oral)   Resp 16   Wt 197 lb (89.4 kg)   SpO2 98%   BMI 29.95 kg/m  Wt Readings from Last 3 Encounters:  07/24/23 197 lb (89.4 kg)  04/25/23 198 lb (89.8 kg)  03/21/23 201 lb (91.2 kg)       Assessment & Plan:   Problem List Items Addressed This Visit       Unprioritized   Type 2 diabetes, controlled, with peripheral neuropathy (HCC) - Primary    Lab Results  Component Value Date   HGBA1C 6.1 (H) 03/10/2023   HGBA1C 6.2 05/11/2022   HGBA1C 6.8 (H) 12/28/2021   Lab Results  Component Value Date   MICROALBUR 0.5 03/10/2023   LDLCALC 38 03/10/2023   CREATININE 0.84 03/10/2023   Clinically  stable on metformin, update A1C.         Relevant Medications   clonazePAM (KLONOPIN) 1 MG tablet   lisinopril (ZESTRIL) 10 MG tablet   Other Relevant Orders   HgB A1c   Thoracic aortic aneurysm [facility])    Order in the system, he will make appointment in imaging on his way out today.      Relevant Medications   lisinopril (ZESTRIL) 10 MG tablet   Insomnia    He is using ambien prn.  Not helpful.  Feels foggy in the am.  D/C ambien.      Fibromyalgia  Notes that cymbalta helps his fibro pain significantly.       Relevant Medications   clonazePAM (KLONOPIN) 1 MG tablet   Essential hypertension    BP stable on lisinopril 10mg /metoprolol.       Relevant Medications   lisinopril (ZESTRIL) 10 MG tablet   Depression with anxiety    Uncontrolled due to family stressors.  Encouraged him to establish with a counselor.  Continue cymbalta, klonopin prn.       Asthma    Has some residual chest congestion from recent bronchitis. He was treated with prednisone and an antibiotic by urgent care (doxycycline x 7 days). Overall improving. continues advair, nebulizer, nasal saline spray, flonase antihistamine. Monitor.       Other Visit Diagnoses     Needs flu shot       Relevant Orders   Flu vaccine trivalent PF, 6mos and older(Flulaval,Afluria,Fluarix,Fluzone) (Completed)       I have discontinued Cody Worrel "Lenny"'s zolpidem. I am also having him maintain his cetirizine, multivitamin with minerals, vitamin C, aspirin EC, icosapent Ethyl, betamethasone dipropionate, ketoconazole, sucralfate, ibuprofen, isosorbide mononitrate, Accu-Chek Guide, fluticasone, tiZANidine, OneTouch Verio, albuterol, albuterol, fluticasone-salmeterol, predniSONE, DULoxetine, metFORMIN, meloxicam, nitroGLYCERIN, gabapentin, traMADol, ipratropium-albuterol, DULoxetine, ezetimibe, ipratropium, Metoprolol Tartrate, montelukast, pantoprazole, predniSONE, tamsulosin, traZODone, atorvastatin, OneTouch Verio,  Accu-Chek Softclix Lancets, clonazePAM, and lisinopril.  Meds ordered this encounter  Medications   DISCONTD: lisinopril (ZESTRIL) 10 MG tablet    Sig: Take 1 tablet (10 mg total) by mouth daily.    Order Specific Question:   Supervising Provider    Answer:   Danise Edge A [[id number]]   clonazePAM (KLONOPIN) 1 MG tablet    Sig: Take 1 tablet (1 mg total) by mouth 2 (two) times daily.    Dispense:  60 tablet    Refill:  0    Order Specific Question:   Supervising Provider    Answer:   Danise Edge A [[id number]]   lisinopril (ZESTRIL) 10 MG tablet    Sig: Take 1 tablet (10 mg total) by mouth daily.    Dispense:  90 tablet    Refill:  1    Order Specific Question:   Supervising Provider    Answer:   Danise Edge A [[id number]]

## 2023-07-24 NOTE — Assessment & Plan Note (Signed)
Notes that cymbalta helps his fibro pain significantly.

## 2023-07-24 NOTE — Assessment & Plan Note (Signed)
Lab Results  Component Value Date   HGBA1C 6.1 (H) 03/10/2023   HGBA1C 6.2 05/11/2022   HGBA1C 6.8 (H) 12/28/2021   Lab Results  Component Value Date   MICROALBUR 0.5 03/10/2023   LDLCALC 38 03/10/2023   CREATININE 0.84 03/10/2023   Clinically stable on metformin, update A1C.

## 2023-07-24 NOTE — Assessment & Plan Note (Addendum)
Uncontrolled due to family stressors.  Encouraged him to establish with a counselor.  Continue cymbalta, klonopin prn.

## 2023-07-24 NOTE — Assessment & Plan Note (Addendum)
BP stable on lisinopril 10mg /metoprolol.

## 2023-07-24 NOTE — Assessment & Plan Note (Signed)
Order in the system, he will make appointment in imaging on his way out today.

## 2023-07-24 NOTE — Assessment & Plan Note (Addendum)
Has some residual chest congestion from recent bronchitis. He was treated with prednisone and an antibiotic by urgent care (doxycycline x 7 days). Overall improving. continues advair, nebulizer, nasal saline spray, flonase antihistamine. Monitor.

## 2023-07-24 NOTE — Assessment & Plan Note (Addendum)
He is using ambien prn.  Not helpful.  Feels foggy in the am.  D/C ambien.

## 2023-07-24 NOTE — Patient Instructions (Addendum)
Please call [facility] to inquire about getting established with a counselor:  [phone]  VISIT SUMMARY:  During today's visit, we discussed your increased stress, anxiety, and depression due to personal and family issues. We also addressed your ongoing asthma symptoms, hypertension, diabetes management, and concerns about your current sleep medication. Additionally, we reviewed your general health maintenance and provided necessary vaccinations and prescription refills.  YOUR PLAN:  -ANXIETY AND DEPRESSION: You are experiencing significant stress due to family issues. We discussed the possibility of counseling for stress management and provided information for behavioral health services, including virtual sessions for convenience.  -ASTHMA: Asthma is a condition where your airways narrow and swell, causing difficulty in breathing. You have had a recent exacerbation treated with prednisone and antibiotics. Continue with your current regimen of Advair and nebulizer treatments. We also discussed considering the RSV vaccine due to your history of severe asthma.  -HYPERTENSION: Hypertension, or high blood pressure, is when the force of the blood against your artery walls is too high. Continue taking Lisinopril 10mg  daily as prescribed.  -DIABETES: Diabetes is a condition that affects how your body processes blood sugar. Your last A1C was 6.1, indicating good control. Continue taking Metformin 500mg  twice daily. We have ordered an updated A1C lab test for you.  -AORTIC ANEURYSM: An aortic aneurysm is an enlargement of the aorta, the main blood vessel that delivers blood to the body. You are due for repeat imaging, and we encouraged you to schedule this appointment.  -INSOMNIA: Insomnia is difficulty falling or staying asleep. You reported that Ambien is not helping with sleep and causes morning fog. We have decided to discontinue Ambien.  -GENERAL HEALTH MAINTENANCE: We  administered the influenza vaccine today and encouraged you to get the COVID-19 booster at your local pharmacy. We also refilled your Clonazepam and Lisinopril prescriptions.  INSTRUCTIONS:  Please schedule an appointment for your repeat imaging for the aortic aneurysm. Continue with your current medications as discussed, and follow up with the behavioral health services for counseling. Get your updated A1C lab test done as ordered. Consider getting the RSV vaccine and the COVID-19 booster at your local pharmacy.

## 2023-07-25 ENCOUNTER — Telehealth: Payer: Self-pay | Admitting: Family

## 2023-07-25 LAB — HEMOGLOBIN A1C: Hgb A1c MFr Bld: 7.6 % — ABNORMAL HIGH (ref 4.6–6.5)

## 2023-07-25 MED ORDER — METFORMIN HCL ER 500 MG PO TB24: 2000.0000 mg | ORAL_TABLET | Freq: Every day | ORAL | 1 refills | Status: DC

## 2023-07-25 NOTE — Telephone Encounter (Signed)
See mychart.  

## 2023-07-26 NOTE — Progress Notes (Deleted)
Subjective:    Patient ID: Cody Raymond, male    DOB: 1971-06-24, 52 y.o.   MRN: [id number]  HPI   Pain Inventory Average Pain {NUMBERS; 0-10:5044} Pain Right Now {NUMBERS; 0-10:5044} My pain is {PAIN DESCRIPTION:21022940}  In the last 24 hours, has pain interfered with the following? General activity {NUMBERS; 0-10:5044} Relation with others {NUMBERS; 0-10:5044} Enjoyment of life {NUMBERS; 0-10:5044} What TIME of day is your pain at its worst? {time of day:24191} Sleep (in general) {BHH GOOD/FAIR/POOR:22877}  Pain is worse with: {ACTIVITIES:21022942} Pain improves with: {PAIN IMPROVES WUJW:11914782} Relief from Meds: {NUMBERS; 0-10:5044}  Family History  Problem Relation Age of Onset   Colon cancer Paternal Grandfather    Prostate cancer Paternal Grandfather    Aneurysm Father    Heart attack Father 34   Coronary artery disease Father    Colon polyps Mother    Heart disease Mother    Coronary artery disease Mother    Migraines Mother    Breast cancer Maternal Grandmother    Colon cancer Paternal Uncle        x 2   Stomach cancer Brother    Liver disease Other        unsure who it was   Allergies Daughter    Social History   Socioeconomic History   Marital status: Married    Spouse name: Not on file   Number of children: Not on file   Years of education: Not on file   Highest education level: Not on file  Occupational History   Not on file  Tobacco Use   Smoking status: Never   Smokeless tobacco: Never  Vaping Use   Vaping status: Never Used  Substance and Sexual Activity   Alcohol use: No   Drug use: No   Sexual activity: Yes  Other Topics Concern   Not on file  Social History Narrative   ** Merged History Encounter **       Holter monitor 08/2010: PVCs and sinus tachy.   Sleep Study (02/2008): mild sleep apnea, no indication for CPAP.   Social Determinants of Health   Financial Resource Strain: Medium Risk (12/06/2021)   Overall Financial  Resource Strain (CARDIA)    Difficulty of Paying Living Expenses: Somewhat hard  Food Insecurity: Food Insecurity Present (12/12/2022)   Hunger Vital Sign    Worried About Running Out of Food in the Last Year: Often true    Ran Out of Food in the Last Year: Sometimes true  Transportation Needs: No Transportation Needs (12/12/2022)   PRAPARE - Administrator, Civil Service (Medical): No    Lack of Transportation (Non-Medical): No  Physical Activity: Insufficiently Active (12/06/2021)   Exercise Vital Sign    Days of Exercise per Week: 3 days    Minutes of Exercise per Session: 20 min  Stress: Stress Concern Present (12/06/2021)   [employer] of Occupational Health - Occupational Stress Questionnaire    Feeling of Stress : To some extent  Social Connections: Moderately Integrated (12/06/2021)   Social Connection and Isolation Panel [NHANES]    Frequency of Communication with Friends and Family: More than three times a week    Frequency of Social Gatherings with Friends and Family: More than three times a week    Attends Religious Services: More than 4 times per year    Active Member of [employer] or Organizations: No    Attends Banker Meetings: Never    Marital Status: Married  Past Surgical History:  Procedure Laterality Date   APPENDECTOMY     BACK SURGERY     CARDIAC CATHETERIZATION  2007   Clean Cardiac Cath [facility] Cards), with RCA 30% narrowing likely due to catheter induced spasm in 09/02/10.   CARDIAC CATHETERIZATION  09/02/2010   mod. nonobstructive disease in the RCA and CX, tortuous LAD   COLONOSCOPY W/ POLYPECTOMY  03/17/11   diminutive polyp   LOOP RECORDER EXPLANT N/A 01/14/2014   Procedure: LOOP RECORDER EXPLANT;  Surgeon: Thurmon Fair, MD;  Location: [facility] CATH LAB;  Service: Cardiovascular;  Laterality: N/A;   LOOP RECORDER IMPLANT  04/06/2012   Reveal XT 4529   LOOP RECORDER IMPLANT N/A 04/06/2012   Procedure: LOOP RECORDER IMPLANT;   Surgeon: Thurmon Fair, MD;  Location: [facility] CATH LAB;  Service: Cardiovascular;  Laterality: N/A;   NM MYOCAR PERF WALL MOTION  01/25/2008   mild anteroapical wall ischemia   PATENT DUCTUS ARTERIOUS REPAIR     at age 68   Past Surgical History:  Procedure Laterality Date   APPENDECTOMY     BACK SURGERY     CARDIAC CATHETERIZATION  2007   Clean Cardiac Cath [facility] Cards), with RCA 30% narrowing likely due to catheter induced spasm in 09/02/10.   CARDIAC CATHETERIZATION  09/02/2010   mod. nonobstructive disease in the RCA and CX, tortuous LAD   COLONOSCOPY W/ POLYPECTOMY  03/17/11   diminutive polyp   LOOP RECORDER EXPLANT N/A 01/14/2014   Procedure: LOOP RECORDER EXPLANT;  Surgeon: Thurmon Fair, MD;  Location: [facility] CATH LAB;  Service: Cardiovascular;  Laterality: N/A;   LOOP RECORDER IMPLANT  04/06/2012   Reveal XT 4529   LOOP RECORDER IMPLANT N/A 04/06/2012   Procedure: LOOP RECORDER IMPLANT;  Surgeon: Thurmon Fair, MD;  Location: [facility] CATH LAB;  Service: Cardiovascular;  Laterality: N/A;   NM MYOCAR PERF WALL MOTION  01/25/2008   mild anteroapical wall ischemia   PATENT DUCTUS ARTERIOUS REPAIR     at age 68   Past Medical History:  Diagnosis Date   Aneurysm (HCC)    Asthma    Blood transfusion    CAD (coronary artery disease), non obstructive on cath 2011 04/05/2012   Coronary artery disease    Diabetes mellitus    Diabetes mellitus without complication (HCC)    Enlarged prostate    GERD (gastroesophageal reflux disease)    H/O syncope    Heart disease    History of repair of patent ductus arteriosus 07/23/2016   Hyperlipidemia    Hypertension    Neuromuscular disorder (HCC)    DJD   Refusal of blood transfusions as patient is Jehovah's Witness    There were no vitals taken for this visit.  Opioid Risk Score:   Fall Risk Score:  `1  Depression screen [facility] 2/9     07/24/2023   12:13 PM 03/10/2023    4:39 PM 02/17/2023    3:17 PM 12/29/2022    2:29 PM 12/21/2022    1:05  PM 12/12/2022    3:07 PM 07/22/2022    8:27 AM  Depression screen PHQ 2/9  Decreased Interest 2 2 1 2  0 1 0  Down, Depressed, Hopeless 2 2 1 1  0 1 0  PHQ - 2 Score 4 4 2 3  0 2 0  Altered sleeping 2 2       Tired, decreased energy 2 2       Change in appetite 2 2  Feeling bad or failure about yourself  2 2       Trouble concentrating 2 2       Moving slowly or fidgety/restless 0 0       Suicidal thoughts 0 0       PHQ-9 Score 14 14       Difficult doing work/chores Very difficult Very difficult         Review of Systems     Objective:   Physical Exam        Assessment & Plan:

## 2023-07-27 ENCOUNTER — Ambulatory Visit: Payer: Medicare HMO | Admitting: Registered Nurse

## 2023-07-28 ENCOUNTER — Telehealth (HOSPITAL_BASED_OUTPATIENT_CLINIC_OR_DEPARTMENT_OTHER): Payer: Self-pay

## 2023-07-29 ENCOUNTER — Ambulatory Visit (HOSPITAL_BASED_OUTPATIENT_CLINIC_OR_DEPARTMENT_OTHER): Payer: Medicare HMO

## 2023-07-31 ENCOUNTER — Telehealth: Payer: Self-pay | Admitting: Cardiovascular Disease

## 2023-07-31 NOTE — Telephone Encounter (Signed)
Patient stated his EKG machine is showing out of range sinus rhythm.  Patient wants advice on next steps.

## 2023-07-31 NOTE — Telephone Encounter (Signed)
Patient identification verified by 2 forms. Marilynn Rail, RN   Called and spoke to patient  Patient states:   -Last night he had chest pain and broke out in a sweat   -was concerned because sweat lasted 30 minutes   -at 10pm he took 2NTG, 3 (81mg ) aspirin, metoprolol. Chest pain resolved   -This morning woke up with chest pain again   -Took 2NTG, 1(81mg ) aspirin   -does not currently have chest pain   -since last night EKG has been abnormal, saying "wide QRS complex)   Advised patient to present to ER for evaluation  Patient states he will present to [facility]  Patient has no further questions at this time

## 2023-08-04 ENCOUNTER — Ambulatory Visit: Payer: Medicare HMO | Attending: Student | Admitting: Student

## 2023-08-04 ENCOUNTER — Ambulatory Visit (HOSPITAL_COMMUNITY): Payer: Medicare HMO | Attending: Student

## 2023-08-04 ENCOUNTER — Encounter: Payer: Self-pay | Admitting: Student

## 2023-08-04 VITALS — BP 96/62 | HR 84 | Ht 68.0 in | Wt 199.0 lb

## 2023-08-04 DIAGNOSIS — R61 Generalized hyperhidrosis: Secondary | ICD-10-CM

## 2023-08-04 DIAGNOSIS — I471 Supraventricular tachycardia, unspecified: Secondary | ICD-10-CM

## 2023-08-04 DIAGNOSIS — R42 Dizziness and giddiness: Secondary | ICD-10-CM

## 2023-08-04 DIAGNOSIS — R9431 Abnormal electrocardiogram [ECG] [EKG]: Secondary | ICD-10-CM | POA: Diagnosis not present

## 2023-08-04 DIAGNOSIS — R002 Palpitations: Secondary | ICD-10-CM | POA: Diagnosis not present

## 2023-08-04 DIAGNOSIS — I10 Essential (primary) hypertension: Secondary | ICD-10-CM | POA: Diagnosis not present

## 2023-08-04 DIAGNOSIS — R0789 Other chest pain: Secondary | ICD-10-CM | POA: Diagnosis not present

## 2023-08-04 DIAGNOSIS — I4510 Unspecified right bundle-branch block: Secondary | ICD-10-CM | POA: Diagnosis not present

## 2023-08-04 DIAGNOSIS — E785 Hyperlipidemia, unspecified: Secondary | ICD-10-CM

## 2023-08-04 DIAGNOSIS — I493 Ventricular premature depolarization: Secondary | ICD-10-CM

## 2023-08-04 DIAGNOSIS — Z8774 Personal history of (corrected) congenital malformations of heart and circulatory system: Secondary | ICD-10-CM

## 2023-08-04 LAB — ECHOCARDIOGRAM COMPLETE
Area-P 1/2: 4.31 cm2
Height: 68 in
S' Lateral: 3.2 cm
Weight: 3184 [oz_av]

## 2023-08-04 MED ORDER — PERFLUTREN LIPID MICROSPHERE
1.0000 mL | INTRAVENOUS | Status: AC | PRN
  Administered 2023-08-04: 2 mL via INTRAVENOUS

## 2023-08-04 MED ORDER — LOSARTAN POTASSIUM 25 MG PO TABS: 25.0000 mg | ORAL_TABLET | Freq: Every day | ORAL | 3 refills | Status: DC

## 2023-08-04 NOTE — Progress Notes (Addendum)
Cardiology Office Note:    Date:  08/04/2023   ID:  Cody Raymond, DOB [birth year], MRN [id number]  PCP:  Sandford Craze, NP  Cardiologist:  Nanetta Batty, MD     Referring MD: Sandford Craze, NP   Chief Complaint: New RBBB  History of Present Illness:    Cody Raymond is a 52 y.o. 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 Allyson Sabal and presents today for evaluation of RBBB.   Patient has a long history of atypical chest pain and has had multiple ischemic evaluations in the past. Remote cardiac catheterization in 2009 and 2011 showed only very mild disease. Echo in 11/2018 showed 60-65% with Coronary CTA in 02/2019 showed a coronary calcium score of 0 and normal coronaries. Last ischemic evaluation was a Myoview in 03/2020 which showed no evidence of ischemia. He also has a history of palpitations with prior monitor in 2021 showing short runs of paroxysmal SVT.   He was last seen by Joni Reining, NP, in 01/2023 at which time he was doing relatively well from a cardiac standpoint.   Patient reports that last Thursday night (07/27/2023) he was sitting in bed when he developed sudden onset of dizziness and nausea with very slight chest pain. He then started to have abdominal pain and went to the bathroom. When he sat on the commode, he started to sweat profusely. He denies any vomiting or syncope with no significant valvular disease. He checked his vital signs and states his BP and blood sugar were fine. Systolic BP was in the 110s and blood sugar was around 100. He has a Nutritional therapist and EKG from  this showed sinus tachycardia with rates in the low 100s and a borderline wide QRS. He took 2 dose of sublingual Nitroglycerin and 3 Aspirin and symptoms gradually eased off after about 40 minutes. This was an isolated episode. He has had no recurrence of this. He denies any other recent chest pain. He does have some intermittent shortness of breath which seems to be due to his asthma. It sounds like he has been started on multiple new inhalers/ nebulizers. Overall, this is stable. No orthopnea, PND, or edema. He has a history of palpitations and did note that his heart was racing during the above event last Thursday. Otherwise, palpitations have been well controlled on his Metoprolol. He notes some occasional lightheadedness with standing that improves if he eats but no other significant lightheadedness/ dizziness other than the episode last Thursday.   He called our office on 07/31/2023 and was advised to go to the ED/ Urgent Care given their was no availability in clinic. He ultimately decided to go to an Urgent Care today given his [facility] device was still indicating a wide QRS. EKG at the Urgent Care showed normal sinus rhythm, rate 77 bpm, with RBBB (QRS 122 ms) but no acute ST/ T changes compared to prior tracing. He was advised to follow-up with Cardiology urgently and was added onto my schedule. Of note, prior EKGs have shown an complete RBBB.   EKGs/Labs/Other Studies Reviewed:    The following studies were reviewed:  Echocardiogram 12/15/2018: Impressions: 1. The left  ventricle has normal systolic function with an ejection  fraction of 60-65%. The cavity size was normal. Left ventricular diastolic  function could not be evaluated due to indeterminent diastolic function.   2. The right ventricle has normal systolic function. The cavity was  normal. There is no increase in right ventricular wall thickness. Right  ventricular systolic pressure could not be assessed.   3. No evidence  present in the left atrial appendage.   4. No pulmonic valve vegetation visualized.  _______________  Coronary CTA 02/26/2019: Impressions: 1. Calcium Score 0 2.  Normal right dominant coronary arteries 3. Previous PDA repair with no residual communication or aneurysm. Normal aortic root 2.6 cm _______________  Monitor 02/07/2020 to 02/11/2020: 1. SR/SB/ST 2. Short runs of PSVT  EKG:  EKG from Urgent Care today personally reviewed and demonstrates normal sinus rhythm, rate 77 bpm, with RBBB but no acute ST/ T changes.  QRS interval 122 ms. QTc 434 ms.   Recent Labs: 03/10/2023: ALT 17; BUN 21; Creat 0.84; Potassium 4.6; Sodium 139  Recent Lipid Panel    Component Value Date/Time   CHOL 97 03/10/2023 1621   CHOL 82 (L) 04/19/2019 0832   TRIG 182 (H) 03/10/2023 1621   HDL 33 (L) 03/10/2023 1621   HDL 31 (L) 04/19/2019 0832   CHOLHDL 2.9 03/10/2023 1621   VLDL 22.6 12/28/2021 1021   LDLCALC 38 03/10/2023 1621   LDLDIRECT 36 04/19/2019 0832   LDLDIRECT 119.0 07/31/2018 1044    Physical Exam:    Vital Signs: BP 96/62 (BP Location: Left Arm, Patient Position: Sitting, Cuff Size: Normal)   Pulse 84   Ht 5\' 8"  (1.727 m)   Wt 199 lb (90.3 kg)   SpO2 99%   BMI 30.26 kg/m     Wt Readings from Last 3 Encounters:  08/04/23 199 lb (90.3 kg)  07/24/23 197 lb (89.4 kg)  04/25/23 198 lb (89.8 kg)     General: 52 y.o. male in no acute distress. HEENT: Normocephalic and atraumatic. Sclera clear.  Neck: Supple. No carotid bruits. No JVD. Heart:  RRR. Distinct S1 and S2. No murmurs, gallops, or rubs.  Lungs: No increased work of breathing. Clear to ausculation bilaterally. No wheezes, rhonchi, or rales.  Extremities: No lower extremity edema. Skin: Warm and dry. Neuro: No focal deficits. Psych: Normal affect. Responds appropriately.   Assessment:    1. Right bundle branch block   2. Dizziness   3. Diaphoresis   4. Atypical chest pain   5. Palpitations   6. PSVT (paroxysmal  supraventricular tachycardia) (HCC)   7. PVC (premature ventricular contraction)   8. S/P repair of PDA   9. Primary hypertension   10. Hyperlipidemia, unspecified hyperlipidemia type     Plan:    New RBBB Patient [employer] device has showed a wide QRS recently. He went to an Urgent Care today and EKG showed there showed normal sinus rhythm with a RBBB. Prior EKGs have shown an incomplete RBBB.  - Will get an Echo.   Dizziness Diaphoresis Patient had an episode last week of significant dizziness with associated nausea and diaphoresis as well as very slight chest pain. His BP and blood sugar during that time were normal. EKG from [facility] device at that time showed mild sinus tachycardia (rates in the low 100s) with wide QRS. He took 2 doses of sublingual Nitro and 3 Aspirin and symptoms gradually eased off after about 40 minutes. This was an isolated event and he has  had no recurrence.  - The episode sounds almost like a vasovagal event but he reports vitals were stable at that time.  - Will get an Echo for further evaluation of new onset RBBB.  - Given this was an isolated event, will hold off on formal monitor at this time. He can continue to use his [facility] device. - Advised patient to let us know if he has any recurrent symptoms.   Atypical Chest Pain Patient has a long history of atypical chest pain with numerous ischemic evaluations in the past. Remote cardiac catheterization in 2009 and 2011 showed very mild CAD. Coronary CTA in 02/2019 showed a coronary calcium score of 0 with normal coronaries. Myoview in 03/2020 showed no ischemic.  - He had very slight chest pain during episodes of dizziness and diaphoresis last week but no other real chest pain. Towards the end of the visit today, he stated he was again having very slight chest pain/ "sensation" which was reproducible with palpation of this area.  - EKG today at the Urgent Care showed no acute ST/ T changes compared  to prior tracings.  - Chest pain does not sound cardiac in nature. No additional ischemic evaluation necessary.   Palpitations Paroxysmal SVT PVCs Patient has a history of palpitations. Prior monitor in 01/2020 showed short episodes of SVT. Reviewed some of his [facility] EKGs which also show some PVCs.  - Palpitations well controlled.  - Continue Lopressor 75mg  twice daily.   PDA s/p Repair Patient has a history of PDA s/p repair at the age of 52 years old. He has been getting annual CTAs for the last few years to monitor some dilatation around this.  Chest CTA in 05/2022 showed that the distal aortic arch just distal to the ductus arteriosus measures 3.4cm.  - PCP has ordered repeat chest CTA for routine monitor of this.   Hypertension BP soft but stable.  - Continue Lopressor 75mg  twice daily.  - Currently on Lisinopril 10mg  daily but he reports a cough with this. Will stop this and start Losartan 25mg  daily instead.  - He has Imdur on his medication list but he states he is no longer taking this. Will remove from list.   Hyperlipidemia Lipid panel in 02/2023: Total Cholesterol 97, Triglycerides 182, HDL 33, LDL 38.  - Continue Vascepa 2g twice daily.  - Labs followed by PCP.   Diabetes - Management per PCP.  Disposition: Follow up in about 2 months after Echo.   Signed, Corrin Parker, PA-C  08/04/2023 1:04 PM    [facility]

## 2023-08-04 NOTE — Addendum Note (Signed)
Addended by: Marjie Skiff on: 08/04/2023 01:04 PM   Modules accepted: Orders

## 2023-08-04 NOTE — Patient Instructions (Signed)
Medication Instructions:  Stop Lisinopril Start Losartan 25 mg daily Continue all other medications *If you need a refill on your cardiac medications before your next appointment, please call your pharmacy*   Lab Work: None ordered   Testing/Procedures: Schedule Echo first available   Follow-Up: At [facility], you and your health needs are our priority.  As part of our continuing mission to provide you with exceptional heart care, we have created designated Provider Care Teams.  These Care Teams include your primary Cardiologist (physician) and Advanced Practice Providers (APPs -  Physician Assistants and Nurse Practitioners) who all work together to provide you with the care you need, when you need it.  We recommend signing up for the patient portal called "MyChart".  Sign up information is provided on this After Visit Summary.  MyChart is used to connect with patients for Virtual Visits (Telemedicine).  Patients are able to view lab/test results, encounter notes, upcoming appointments, etc.  Non-urgent messages can be sent to your provider as well.   To learn more about what you can do with MyChart, go to [email or web address].    Your next appointment:  09/2023   after Echo    Provider:  Marjie Skiff PA

## 2023-08-16 ENCOUNTER — Ambulatory Visit: Payer: Medicare HMO | Admitting: Registered Nurse

## 2023-08-19 ENCOUNTER — Other Ambulatory Visit: Payer: Self-pay | Admitting: Family

## 2023-08-22 NOTE — Progress Notes (Unsigned)
Cardiology Office Note:    Date:  08/22/2023   ID:  Cody Raymond, DOB 28-Nov-1970, MRN [id number]  PCP:  Sandford Craze, NP  Cardiologist:  Nanetta Batty, MD { Click to update primary MD,subspecialty MD or APP then REFRESH:1}    Referring MD: Sandford Craze, NP   Chief Complaint: follow-up of RBBB  History of Present Illness:    Cody Raymond is a 52 y.o. 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 Allyson Sabal and presents today for follow-up of RBBB.   Patient has a long history of atypical chest pain and has had multiple ischemic evaluations in the past. Remote cardiac catheterization in 2009 and 2011 showed only very mild disease. Echo in 11/2018 showed 60-65% with Coronary CTA in 02/2019 showed a coronary calcium score of 0 and normal coronaries. Last ischemic evaluation was a Myoview in 03/2020 which showed no evidence of ischemia. He also has a history of palpitations with prior monitor in 2021 showing short runs of paroxysmal SVT.   He was last seen by me on 08/04/2023 for further evaluation of a new RBBB. He had been monitoring his vitals closely after a recent episode of dizziness and diaphoresis. He uses a Nutritional therapist which indicated that he had a wide QRS. He went to an Urgent Care that same day and EKG there revealed normal sinus rhythm with a RBBB. Prior tracings had shown an incomplete RBBB. Echo was ordered and showed LVEF of 55-60% with normal wall motion and diastolic parameters, normal RV size and function, and no significant valvular disease.   Patient presents today for  follow-up. ***  New RBBB Patient was recently diagnosed with a new RBBB in 07/2023 (prior tracings showed incomplete RBBB). Echo showed normal LV function with no evidence of structural heart disease. - No additional work-up necessary at this time.   Atypical Chest Pain Patient has a long history of atypical chest pain with numerous ischemic evaluations in the past. Remote cardiac catheterization in 2009 and 2011 showed very mild CAD. Coronary CTA in 02/2019 showed a coronary calcium score of 0 with normal coronaries. Myoview in 03/2020 showed no ischemic.  - ***   Palpitations Paroxysmal SVT PVCs Patient has a history of palpitations. Prior monitor in 01/2020 showed short episodes of SVT. Reviewed some of his [facility] EKGs which also show some PVCs.  - Palpitations well controlled. *** - Continue Lopressor 75mg  twice daily.    PDA s/p Repair Patient has a history of PDA s/p repair at the age of 52 years old. He has been getting annual CTAs for the last few years to monitor some dilatation around this.  Chest CTA in 05/2022 showed that the distal aortic arch just distal to the ductus arteriosus measures 3.4cm.  - PCP has ordered repeat chest CTA for routine monitor of this. ***   Hypertension BP soft but stable. *** - Continue current medications: Losartan 25mg  daily and Lopressor 75mg  twice daily.   Hyperlipidemia Lipid panel in 02/2023: Total Cholesterol 97, Triglycerides 182, HDL 33, LDL 38.  - Continue Vascepa 2g twice daily.  - Labs followed by PCP.  Diabetes - Management per PCP.    EKGs/Labs/Other Studies Reviewed:    The following studies were reviewed:  Echocardiogram 12/15/2018: Impressions: 1. The left ventricle has normal systolic function with an ejection  fraction of 60-65%. The cavity size was normal. Left ventricular diastolic  function could not be evaluated due to indeterminent diastolic function.   2. The right ventricle has normal systolic function. The  cavity was  normal. There is no increase in right ventricular wall thickness. Right  ventricular systolic pressure could not be assessed.   3. No evidence present in the left atrial appendage.   4. No pulmonic valve vegetation visualized.  _______________   Coronary CTA 02/26/2019: Impressions: 1. Calcium Score 0 2.  Normal right dominant coronary arteries 3. Previous PDA repair with no residual communication or aneurysm. Normal aortic root 2.6 cm _______________   Monitor 02/07/2020 to 02/11/2020: 1. SR/SB/ST 2. Short runs of PSVT _______________  Echocardiogram 08/04/2023: Impressions:  1. Left ventricular ejection fraction, by estimation, is 55 to 60%. The  left ventricle has normal function. The left ventricle has no regional  wall motion abnormalities. Left ventricular diastolic parameters were  normal.   2. Right ventricular systolic function is normal. The right ventricular  size is normal. Tricuspid regurgitation signal is inadequate for assessing  PA pressure.   3. The mitral valve is normal in structure. No evidence of mitral valve  regurgitation. No evidence of mitral stenosis.   4. The aortic valve is tricuspid. Aortic valve regurgitation is not  visualized. No aortic stenosis is present.   5. The inferior vena cava is normal in size with greater than 50%  respiratory variability, suggesting right atrial pressure of 3 mmHg.    EKG:  EKG not ordered today.   Recent Labs: 03/10/2023: ALT 17; BUN 21; Creat 0.84; Potassium 4.6; Sodium 139  Recent Lipid Panel    Component Value Date/Time   CHOL 97 03/10/2023 1621   CHOL 82 (L) 04/19/2019 0832   TRIG 182 (H) 03/10/2023 1621   HDL 33 (L) 03/10/2023 1621   HDL 31 (L) 04/19/2019 0832   CHOLHDL 2.9 03/10/2023 1621   VLDL 22.6 12/28/2021 1021   LDLCALC 38 03/10/2023 1621   LDLDIRECT 36 04/19/2019 0832   LDLDIRECT 119.0 07/31/2018 1044    Physical Exam:    Vital Signs: There were no vitals taken for this visit.     Wt Readings from Last 3 Encounters:  08/04/23 199 lb (90.3 kg)  07/24/23 197 lb (89.4 kg)  04/25/23 198 lb (89.8 kg)     General: 52 y.o. male in no acute distress. HEENT: Normocephalic and atraumatic. Sclera clear.  Neck: Supple. No carotid bruits. No JVD. Heart: *** RRR. Distinct S1 and S2. No murmurs, gallops, or rubs.  Lungs: No increased work of breathing. Clear to ausculation bilaterally. No wheezes, rhonchi, or rales.  Abdomen: Soft, non-distended, and non-tender to palpation.  Extremities: No lower extremity edema.  Radial and distal pedal pulses 2+ and equal bilaterally. Skin: Warm and dry. Neuro: No focal deficits. Psych: Normal affect. Responds appropriately.   Assessment:    No diagnosis found.  Plan:     Disposition: Follow up in ***   Signed, Corrin Parker, PA-C  08/22/2023 10:48 AM    [facility]

## 2023-08-31 ENCOUNTER — Other Ambulatory Visit (HOSPITAL_BASED_OUTPATIENT_CLINIC_OR_DEPARTMENT_OTHER): Payer: Self-pay

## 2023-08-31 ENCOUNTER — Other Ambulatory Visit: Payer: Self-pay | Admitting: Family

## 2023-09-01 ENCOUNTER — Other Ambulatory Visit (HOSPITAL_BASED_OUTPATIENT_CLINIC_OR_DEPARTMENT_OTHER): Payer: Self-pay

## 2023-09-01 ENCOUNTER — Other Ambulatory Visit: Payer: Self-pay

## 2023-09-01 ENCOUNTER — Ambulatory Visit: Payer: Medicare HMO | Admitting: Student

## 2023-09-01 MED ORDER — CLONAZEPAM 1 MG PO TABS
1.0000 mg | ORAL_TABLET | Freq: Two times a day (BID) | ORAL | 0 refills | Status: DC
  Filled 2023-09-01: qty 60, 30d supply, fill #0

## 2023-09-03 ENCOUNTER — Other Ambulatory Visit: Payer: Self-pay | Admitting: Family

## 2023-09-11 ENCOUNTER — Other Ambulatory Visit: Payer: Self-pay | Admitting: Registered Nurse

## 2023-09-16 ENCOUNTER — Telehealth: Payer: Medicare HMO | Admitting: Physician Assistant

## 2023-09-16 ENCOUNTER — Telehealth: Payer: Medicare HMO

## 2023-09-16 DIAGNOSIS — R059 Cough, unspecified: Secondary | ICD-10-CM | POA: Diagnosis not present

## 2023-09-16 MED ORDER — METHYLPREDNISOLONE 4 MG PO TBPK
ORAL_TABLET | ORAL | 0 refills | Status: DC
  Filled 2023-09-16: qty 21, 6d supply, fill #0

## 2023-09-16 NOTE — Patient Instructions (Signed)
Brynda Peon, thank you for joining [name], PA-C for today's virtual visit.  While this provider is not your primary care provider (PCP), if your PCP is located in our provider database this encounter information will be shared with them immediately following your visit.   A [facility] MyChart account gives you access to today's visit and all your visits, tests, and labs performed at [facility] " click here if you don't have a [facility] MyChart account or go to [email or web address]  Consent: (Patient) Cody Raymond provided verbal consent for this virtual visit at the beginning of the encounter.  Current Medications:  Current Outpatient Medications:    clonazePAM (KLONOPIN) 1 MG tablet, Take 1 tablet (1 mg total) by mouth 2 (two) times daily., Disp: 60 tablet, Rfl: 0   Accu-Chek Softclix Lancets lancets, TEST BLOOD SUGAR TWO TIMES DAILY AS DIRECTED, Disp: 200 each, Rfl: 3   albuterol (PROVENTIL) (2.5 MG/3ML) 0.083% nebulizer solution, Take 3 mLs (2.5 mg total) by nebulization every 6 (six) hours as needed for wheezing or shortness of breath., Disp: 75 mL, Rfl: 5   albuterol (VENTOLIN HFA) 108 (90 Base) MCG/ACT inhaler, Inhale 2 puffs into the lungs every 6 (six) hours as needed for wheezing or shortness of breath., Disp: 6.7 g, Rfl: 5   Ascorbic Acid (VITAMIN C) 1000 MG tablet, Take 1,000 mg by mouth daily., Disp: , Rfl:    aspirin EC 81 MG EC tablet, Take 1 tablet (81 mg total) by mouth daily., Disp: 30 tablet, Rfl: 0   atorvastatin (LIPITOR) 80 MG tablet, Take 1 tablet by mouth daily., Disp: , Rfl:    betamethasone dipropionate (DIPROLENE) 0.05 % cream, Apply topically 2 (two) times daily., Disp: 30 g, Rfl: 0   Blood Glucose Monitoring Suppl (ACCU-CHEK GUIDE) w/Device KIT, USE AS DIRECTED, Disp: 1 kit, Rfl: 10   cetirizine (ZYRTEC) 10 MG tablet, Take 10 mg by mouth daily., Disp: , Rfl:    DULoxetine (CYMBALTA) 30 MG capsule, TAKE 1 CAPSULE EVERY DAY, Disp:  90 capsule, Rfl: 1   DULoxetine (CYMBALTA) 60 MG capsule, Take 1 capsule by mouth daily., Disp: , Rfl:    ezetimibe (ZETIA) 10 MG tablet, Take 1 tablet by mouth daily., Disp: , Rfl:    fluticasone (FLONASE) 50 MCG/ACT nasal spray, USE 2 SPRAYS IN EACH NOSTRIL ONE TIME DAILY, Disp: 48 g, Rfl: 3   fluticasone-salmeterol (ADVAIR) 250-50 MCG/ACT AEPB, INHALE 1 PUFF INTO THE LUNGS IN THE MORNING AND AT BEDTIME., Disp: 180 each, Rfl: 1   gabapentin (NEURONTIN) 300 MG capsule, TAKE 1 CAPSULE AT 6AM, 11:30AM, 4:30PM AND TAKE 3 CAPSULES AT 10PM, Disp: 540 capsule, Rfl: 0   glucose blood (ONETOUCH VERIO) test strip, TEST BLOOD SUGAR THREE TIMES DAILY, Disp: 300 strip, Rfl: 12   glucose blood (ONETOUCH VERIO) test strip, , Disp: , Rfl:    ibuprofen (IBU) 800 MG tablet, , Disp: , Rfl:    Icosapent Ethyl (VASCEPA) 1 g CAPS, Take 2 capsules (2 g total) by mouth 2 (two) times daily., Disp: 120 capsule, Rfl: 0   ipratropium (ATROVENT) 0.03 % nasal spray, Place into the nose., Disp: , Rfl:    ipratropium-albuterol (DUONEB) 0.5-2.5 (3) MG/3ML SOLN, Inhale into the lungs., Disp: , Rfl:    ketoconazole (NIZORAL) 2 % shampoo, Apply topically 2 (two) times a week., Disp: 360 mL, Rfl: 1   losartan (COZAAR) 25 MG tablet, Take 1 tablet (25 mg total) by mouth daily., Disp: 90 tablet, Rfl: 3   meloxicam (  MOBIC) 7.5 MG tablet, TAKE 1 TABLET DAILY AS NEEDED FOR PAIN, Disp: 90 tablet, Rfl: 1   metFORMIN (GLUCOPHAGE-XR) 500 MG 24 hr tablet, Take 4 tablets (2,000 mg total) by mouth daily with breakfast., Disp: 360 tablet, Rfl: 1   Metoprolol Tartrate 75 MG TABS, Take by mouth., Disp: , Rfl:    montelukast (SINGULAIR) 10 MG tablet, Take 1 tablet by mouth at bedtime., Disp: , Rfl:    Multiple Vitamin (MULTIVITAMIN WITH MINERALS) TABS tablet, Take 1 tablet by mouth daily., Disp: , Rfl:    nitroGLYCERIN (NITROSTAT) 0.4 MG SL tablet, DISSOLVE 1 TABLET UNDER THE TONGUE EVERY 5 MINUTES AS NEEDED FOR CHEST PAIN, Disp: 25 tablet, Rfl:  11   pantoprazole (PROTONIX) 40 MG tablet, Take 1 tablet by mouth daily., Disp: , Rfl:    predniSONE (DELTASONE) 10 MG tablet, Take 4 tablets by mouth once daily for 2 days, then 3 tablets daily for 2 days, then 2 tablets daily for 2 days, and then 1 tablet daily for 2 days., Disp: 20 tablet, Rfl: 0   predniSONE (DELTASONE) 20 MG tablet, Take 20 mg by mouth 2 (two) times daily., Disp: , Rfl:    sucralfate (CARAFATE) 1 g tablet, Take 1 tablet (1 g total) by mouth in the morning, at noon, and at bedtime., Disp: 21 tablet, Rfl: 0   tamsulosin (FLOMAX) 0.4 MG CAPS capsule, Take 1 capsule by mouth daily., Disp: , Rfl:    tiZANidine (ZANAFLEX) 2 MG tablet, TAKE 1 TABLET EVERY 6 HOURS AS NEEDED FOR MUSCLE SPASM(S), Disp: 360 tablet, Rfl: 3   traMADol (ULTRAM) 50 MG tablet, Take 1 tablet (50 mg total) by mouth 2 (two) times daily as needed., Disp: 60 tablet, Rfl: 0   traZODone (DESYREL) 100 MG tablet, Take 1 tablet by mouth at bedtime., Disp: , Rfl:    Medications ordered in this encounter:  No orders of the defined types were placed in this encounter.    *If you need refills on other medications prior to your next appointment, please contact your pharmacy*  Follow-Up: Call back or seek an in-person evaluation if the symptoms worsen or if the condition fails to improve as anticipated.  [facility] [phone]  Other Instructions Follow up with Primary provider in 48 hours. Report to ER with worsening symptoms.    If you have been instructed to have an in-person evaluation today at a local Urgent Care facility, please use the link below. It will take you to a list of all of our available [facility], including address, phone number and hours of operation. Please do not delay care.  [facility]  If you or a family member do not have a primary care provider, use the link below to schedule a visit and establish care. When you choose a [facility] primary care  physician or advanced practice provider, you gain a long-term partner in health. Find a Primary Care Provider  Learn more about [facility] in-office and virtual care options: [facility] - Get Care Now

## 2023-09-16 NOTE — Progress Notes (Signed)
Virtual Visit Consent   Cody Raymond, you are scheduled for a virtual visit with a [facility] provider today. Just as with appointments in the office, your consent must be obtained to participate. Your consent will be active for this visit and any virtual visit you may have with one of our providers in the next 365 days. If you have a MyChart account, a copy of this consent can be sent to you electronically.  As this is a virtual visit, video technology does not allow for your provider to perform a traditional examination. This may limit your provider's ability to fully assess your condition. If your provider identifies any concerns that need to be evaluated in person or the need to arrange testing (such as labs, EKG, etc.), we will make arrangements to do so. Although advances in technology are sophisticated, we cannot ensure that it will always work on either your end or our end. If the connection with a video visit is poor, the visit may have to be switched to a telephone visit. With either a video or telephone visit, we are not always able to ensure that we have a secure connection.  By engaging in this virtual visit, you consent to the provision of healthcare and authorize for your insurance to be billed (if applicable) for the services provided during this visit. Depending on your insurance coverage, you may receive a charge related to this service.  I need to obtain your verbal consent now. Are you willing to proceed with your visit today? Cody Raymond has provided verbal consent on 09/16/2023 for a virtual visit (video or telephone). Cody Raymond, [place]  Date: 09/16/2023 10:35 AM  Virtual Visit via Video Note   I, Cody Raymond, connected with  Cody Raymond  ([id number], 1971-09-04) on 09/16/23 at 10:30 AM EST by a video-enabled telemedicine application and verified that I am speaking with the correct person using two identifiers.  Location: Patient: Virtual Visit Location  Patient: Home Provider: Virtual Visit Location Provider: Home Office   I discussed the limitations of evaluation and management by telemedicine and the availability of in person appointments. The patient expressed understanding and agreed to proceed.    History of Present Illness: Cody Raymond is a 52 y.o. who identifies as a male who was assigned male at birth, and is being seen today for cough.  HPI: URI  This is a new problem. The current episode started 1 to 4 weeks ago. The problem has been gradually worsening. There has been no fever. He has tried decongestant for the symptoms. The treatment provided no relief.    Problems:  Patient Active Problem List   Diagnosis Date Noted   COVID-19 04/25/2023   Rib pain on right side 03/10/2023   Epistaxis 02/08/2022   Frequent PVCs 02/08/2022   Corn of toe 03/31/2021   Cystoid macular edema of right eye 02/09/2021   Cystoid macular edema of left eye 02/09/2021   Retinitis pigmentosa, both eyes 02/09/2021   Family history of coronary artery disease 04/19/2019   Dyslipidemia 10/23/2018   Thoracic aortic aneurysm (HCC) 10/23/2018   Trochanteric bursitis of right hip 10/17/2016   History of repair of patent ductus arteriosus 07/23/2016   Cervicalgia 07/08/2016   Fibromyalgia 07/08/2016   Cervical spondylosis without myelopathy 07/08/2016   Essential hypertension 02/08/2016   Lumbar facet arthropathy 10/19/2015   Chronic lumbar radiculopathy 06/22/2015   History of syncope 10/20/2014   Allergic rhinitis 07/08/2014   Lumbar radiculopathy, chronic 07/26/2013   Morton's neuroma  of left foot 03/18/2013   Depression with anxiety 12/21/2012   Bronchitis with bronchospasm 07/23/2012   Insomnia 07/23/2012   CAD (coronary artery disease), non obstructive on cath 2011 04/05/2012   Right bundle branch block 04/04/2012   Peripheral neuropathy 04/04/2012   Chronic back pain 03/28/2011   Benign prostatic hyperplasia with nocturia 12/20/2010    Type 2 diabetes, controlled, with peripheral neuropathy (HCC) 09/07/2010   GERD 09/07/2010   OBESITY, NOS 11/16/2006   ERECTILE DYSFUNCTION 11/16/2006   Asthma 11/16/2006    Allergies:  Allergies  Allergen Reactions   Diphenhydramine     Other reaction(s): confusion  Other Reaction(s): Other (See Comments)  "drives me nuts"    Other reaction(s): confusion    Other Reaction(s): Mental Status Changes   Diphenhydramine Hcl     Other Reaction(s): Mental Status Changes   Benadryl [Diphenhydramine Hcl]     "drives me nuts"   Medications:  Current Outpatient Medications:    clonazePAM (KLONOPIN) 1 MG tablet, Take 1 tablet (1 mg total) by mouth 2 (two) times daily., Disp: 60 tablet, Rfl: 0   Accu-Chek Softclix Lancets lancets, TEST BLOOD SUGAR TWO TIMES DAILY AS DIRECTED, Disp: 200 each, Rfl: 3   albuterol (PROVENTIL) (2.5 MG/3ML) 0.083% nebulizer solution, Take 3 mLs (2.5 mg total) by nebulization every 6 (six) hours as needed for wheezing or shortness of breath., Disp: 75 mL, Rfl: 5   albuterol (VENTOLIN HFA) 108 (90 Base) MCG/ACT inhaler, Inhale 2 puffs into the lungs every 6 (six) hours as needed for wheezing or shortness of breath., Disp: 6.7 g, Rfl: 5   Ascorbic Acid (VITAMIN C) 1000 MG tablet, Take 1,000 mg by mouth daily., Disp: , Rfl:    aspirin EC 81 MG EC tablet, Take 1 tablet (81 mg total) by mouth daily., Disp: 30 tablet, Rfl: 0   atorvastatin (LIPITOR) 80 MG tablet, Take 1 tablet by mouth daily., Disp: , Rfl:    betamethasone dipropionate (DIPROLENE) 0.05 % cream, Apply topically 2 (two) times daily., Disp: 30 g, Rfl: 0   Blood Glucose Monitoring Suppl (ACCU-CHEK GUIDE) w/Device KIT, USE AS DIRECTED, Disp: 1 kit, Rfl: 10   cetirizine (ZYRTEC) 10 MG tablet, Take 10 mg by mouth daily., Disp: , Rfl:    DULoxetine (CYMBALTA) 30 MG capsule, TAKE 1 CAPSULE EVERY DAY, Disp: 90 capsule, Rfl: 1   DULoxetine (CYMBALTA) 60 MG capsule, Take 1 capsule by mouth daily., Disp: , Rfl:     ezetimibe (ZETIA) 10 MG tablet, Take 1 tablet by mouth daily., Disp: , Rfl:    fluticasone (FLONASE) 50 MCG/ACT nasal spray, USE 2 SPRAYS IN EACH NOSTRIL ONE TIME DAILY, Disp: 48 g, Rfl: 3   fluticasone-salmeterol (ADVAIR) 250-50 MCG/ACT AEPB, INHALE 1 PUFF INTO THE LUNGS IN THE MORNING AND AT BEDTIME., Disp: 180 each, Rfl: 1   gabapentin (NEURONTIN) 300 MG capsule, TAKE 1 CAPSULE AT 6AM, 11:30AM, 4:30PM AND TAKE 3 CAPSULES AT 10PM, Disp: 540 capsule, Rfl: 0   glucose blood (ONETOUCH VERIO) test strip, TEST BLOOD SUGAR THREE TIMES DAILY, Disp: 300 strip, Rfl: 12   glucose blood (ONETOUCH VERIO) test strip, , Disp: , Rfl:    ibuprofen (IBU) 800 MG tablet, , Disp: , Rfl:    Icosapent Ethyl (VASCEPA) 1 g CAPS, Take 2 capsules (2 g total) by mouth 2 (two) times daily., Disp: 120 capsule, Rfl: 0   ipratropium (ATROVENT) 0.03 % nasal spray, Place into the nose., Disp: , Rfl:    ipratropium-albuterol (DUONEB) 0.5-2.5 (3)  MG/3ML SOLN, Inhale into the lungs., Disp: , Rfl:    ketoconazole (NIZORAL) 2 % shampoo, Apply topically 2 (two) times a week., Disp: 360 mL, Rfl: 1   losartan (COZAAR) 25 MG tablet, Take 1 tablet (25 mg total) by mouth daily., Disp: 90 tablet, Rfl: 3   meloxicam (MOBIC) 7.5 MG tablet, TAKE 1 TABLET DAILY AS NEEDED FOR PAIN, Disp: 90 tablet, Rfl: 1   metFORMIN (GLUCOPHAGE-XR) 500 MG 24 hr tablet, Take 4 tablets (2,000 mg total) by mouth daily with breakfast., Disp: 360 tablet, Rfl: 1   Metoprolol Tartrate 75 MG TABS, Take by mouth., Disp: , Rfl:    montelukast (SINGULAIR) 10 MG tablet, Take 1 tablet by mouth at bedtime., Disp: , Rfl:    Multiple Vitamin (MULTIVITAMIN WITH MINERALS) TABS tablet, Take 1 tablet by mouth daily., Disp: , Rfl:    nitroGLYCERIN (NITROSTAT) 0.4 MG SL tablet, DISSOLVE 1 TABLET UNDER THE TONGUE EVERY 5 MINUTES AS NEEDED FOR CHEST PAIN, Disp: 25 tablet, Rfl: 11   pantoprazole (PROTONIX) 40 MG tablet, Take 1 tablet by mouth daily., Disp: , Rfl:    predniSONE  (DELTASONE) 10 MG tablet, Take 4 tablets by mouth once daily for 2 days, then 3 tablets daily for 2 days, then 2 tablets daily for 2 days, and then 1 tablet daily for 2 days., Disp: 20 tablet, Rfl: 0   predniSONE (DELTASONE) 20 MG tablet, Take 20 mg by mouth 2 (two) times daily., Disp: , Rfl:    sucralfate (CARAFATE) 1 g tablet, Take 1 tablet (1 g total) by mouth in the morning, at noon, and at bedtime., Disp: 21 tablet, Rfl: 0   tamsulosin (FLOMAX) 0.4 MG CAPS capsule, Take 1 capsule by mouth daily., Disp: , Rfl:    tiZANidine (ZANAFLEX) 2 MG tablet, TAKE 1 TABLET EVERY 6 HOURS AS NEEDED FOR MUSCLE SPASM(S), Disp: 360 tablet, Rfl: 3   traMADol (ULTRAM) 50 MG tablet, Take 1 tablet (50 mg total) by mouth 2 (two) times daily as needed., Disp: 60 tablet, Rfl: 0   traZODone (DESYREL) 100 MG tablet, Take 1 tablet by mouth at bedtime., Disp: , Rfl:   Observations/Objective: Patient is well-developed, well-nourished in no acute distress.  Resting comfortably  at home.  Head is normocephalic, atraumatic.  No labored breathing.  Speech is clear and coherent with logical content.  Patient is alert and oriented at baseline.  No cough on exam   Assessment and Plan: 1. Cough, unspecified type (Primary)  patient presents with symptoms suspicious for likely viral upper respiratory infection. His grand kids have a similiar illness. Advised to continue antihistamines and Singulair. . Differential includes bacterial pneumonia, sinusitis, allergic rhinitis. Do not suspect underlying cardiopulmonary process. I considered, but think unlikely, dangerous causes of this patient's symptoms to include ACS, CHF or COPD exacerbations, pneumonia, pneumothorax. Patient is nontoxic appearing and not in need of emergent medical intervention.  Plan: reassurance, reassessment, over the counter medications, discharge with PCP followup  Follow Up Instructions: I discussed the assessment and treatment plan with the patient. The  patient was provided an opportunity to ask questions and all were answered. The patient agreed with the plan and demonstrated an understanding of the instructions.  A copy of instructions were sent to the patient via MyChart unless otherwise noted below.     The patient was advised to call back or seek an in-person evaluation if the symptoms worsen or if the condition fails to improve as anticipated.    Cody Kidney, PA-C

## 2023-09-17 ENCOUNTER — Other Ambulatory Visit (HOSPITAL_BASED_OUTPATIENT_CLINIC_OR_DEPARTMENT_OTHER): Payer: Self-pay

## 2023-09-18 ENCOUNTER — Encounter: Payer: Self-pay | Admitting: Cardiovascular Disease

## 2023-09-18 ENCOUNTER — Other Ambulatory Visit (HOSPITAL_BASED_OUTPATIENT_CLINIC_OR_DEPARTMENT_OTHER): Payer: Self-pay

## 2023-09-25 ENCOUNTER — Emergency Department (HOSPITAL_COMMUNITY): Payer: No Typology Code available for payment source

## 2023-09-25 ENCOUNTER — Emergency Department (HOSPITAL_COMMUNITY)
Admission: EM | Admit: 2023-09-25 | Discharge: 2023-09-25 | Disposition: A | Discharge status: Home / Self Care | Payer: No Typology Code available for payment source | Attending: Emergency Medicine | Admitting: Emergency Medicine

## 2023-09-25 ENCOUNTER — Ambulatory Visit: Payer: Medicare HMO | Admitting: Family

## 2023-09-25 DIAGNOSIS — M25531 Pain in right wrist: Secondary | ICD-10-CM | POA: Diagnosis present

## 2023-09-25 DIAGNOSIS — R1010 Upper abdominal pain, unspecified: Secondary | ICD-10-CM | POA: Insufficient documentation

## 2023-09-25 DIAGNOSIS — R739 Hyperglycemia, unspecified: Secondary | ICD-10-CM | POA: Diagnosis not present

## 2023-09-25 DIAGNOSIS — E871 Hypo-osmolality and hyponatremia: Secondary | ICD-10-CM | POA: Insufficient documentation

## 2023-09-25 DIAGNOSIS — R0789 Other chest pain: Secondary | ICD-10-CM | POA: Diagnosis not present

## 2023-09-25 DIAGNOSIS — Y92410 Unspecified street and highway as the place of occurrence of the external cause: Secondary | ICD-10-CM | POA: Diagnosis not present

## 2023-09-25 DIAGNOSIS — R519 Headache, unspecified: Secondary | ICD-10-CM | POA: Insufficient documentation

## 2023-09-25 DIAGNOSIS — D72829 Elevated white blood cell count, unspecified: Secondary | ICD-10-CM | POA: Insufficient documentation

## 2023-09-25 DIAGNOSIS — S60211A Contusion of right wrist, initial encounter: Secondary | ICD-10-CM | POA: Diagnosis not present

## 2023-09-25 DIAGNOSIS — Z7982 Long term (current) use of aspirin: Secondary | ICD-10-CM | POA: Insufficient documentation

## 2023-09-25 DIAGNOSIS — M25532 Pain in left wrist: Secondary | ICD-10-CM | POA: Insufficient documentation

## 2023-09-25 LAB — CBC WITH DIFFERENTIAL/PLATELET
Abs Immature Granulocytes: 0.11 10*3/uL — ABNORMAL HIGH (ref 0.00–0.07)
Basophils Absolute: 0 10*3/uL (ref 0.0–0.1)
Basophils Relative: 0 %
Eosinophils Absolute: 0 10*3/uL (ref 0.0–0.5)
Eosinophils Relative: 0 %
HCT: 41 % (ref 39.0–52.0)
Hemoglobin: 13.2 g/dL (ref 13.0–17.0)
Immature Granulocytes: 1 %
Lymphocytes Relative: 9 %
Lymphs Abs: 1.5 10*3/uL (ref 0.7–4.0)
MCH: 25.9 pg — ABNORMAL LOW (ref 26.0–34.0)
MCHC: 32.2 g/dL (ref 30.0–36.0)
MCV: 80.6 fL (ref 80.0–100.0)
Monocytes Absolute: 1.1 10*3/uL — ABNORMAL HIGH (ref 0.1–1.0)
Monocytes Relative: 6 %
Neutro Abs: 15.1 10*3/uL — ABNORMAL HIGH (ref 1.7–7.7)
Neutrophils Relative %: 84 %
Platelets: 313 10*3/uL (ref 150–400)
RBC: 5.09 MIL/uL (ref 4.22–5.81)
RDW: 17.1 % — ABNORMAL HIGH (ref 11.5–15.5)
WBC: 17.9 10*3/uL — ABNORMAL HIGH (ref 4.0–10.5)
nRBC: 0 % (ref 0.0–0.2)

## 2023-09-25 LAB — COMPREHENSIVE METABOLIC PANEL
ALT: 17 U/L (ref 0–44)
AST: 19 U/L (ref 15–41)
Albumin: 3.4 g/dL — ABNORMAL LOW (ref 3.5–5.0)
Alkaline Phosphatase: 110 U/L (ref 38–126)
Anion gap: 10 (ref 5–15)
BUN: 22 mg/dL — ABNORMAL HIGH (ref 6–20)
CO2: 22 mmol/L (ref 22–32)
Calcium: 8.5 mg/dL — ABNORMAL LOW (ref 8.9–10.3)
Chloride: 101 mmol/L (ref 98–111)
Creatinine, Ser: 1.06 mg/dL (ref 0.61–1.24)
GFR, Estimated: >60 mL/min (ref >60)
Glucose, Bld: 370 mg/dL — ABNORMAL HIGH (ref 70–99)
Potassium: 4.2 mmol/L (ref 3.5–5.1)
Sodium: 133 mmol/L — ABNORMAL LOW (ref 135–145)
Total Bilirubin: 0.6 mg/dL (ref 0.0–1.2)
Total Protein: 6.4 g/dL — ABNORMAL LOW (ref 6.5–8.1)

## 2023-09-25 LAB — CBG MONITORING, ED: Glucose-Capillary: 342 mg/dL — ABNORMAL HIGH (ref 70–99)

## 2023-09-25 MED ORDER — IOHEXOL 350 MG/ML SOLN
75.0000 mL | Freq: Once | INTRAVENOUS | Status: AC | PRN
  Administered 2023-09-25: 75 mL via INTRAVENOUS
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
## 2023-09-25 NOTE — Discharge Instructions (Signed)
 Use Tylenol every 4 hours and ibuprofen every 6 hours needed for pain.  Return for new concerns.

## 2023-09-25 NOTE — ED Notes (Signed)
 Patient has been given the opportunity to ask questions, patient verbalized understanding of discharge instructions, no questions or concerns at this time. Patient is alert and oriented, ambulatory to ED waiting room with spouse and daughter.

## 2023-09-25 NOTE — ED Notes (Signed)
 Patient currently in room with daughter, patient is A/Ox4, no distress noted at this time.

## 2023-09-26 ENCOUNTER — Telehealth: Payer: Self-pay | Admitting: *Deleted

## 2023-09-26 NOTE — Transitions of Care (Post Inpatient/ED Visit) (Signed)
   09/26/2023  Name: Cody Raymond MRN: [id number] DOB: Oct 21, 1970  Today's TOC FU Call Status: Today's TOC FU Call Status:: Unsuccessful Call (1st Attempt) Unsuccessful Call (1st Attempt) Date: 09/26/23  Attempted to reach the patient regarding the most recent Inpatient/ED visit.  Follow Up Plan: Additional outreach attempts will be made to reach the patient to complete the Transitions of Care (Post Inpatient/ED visit) call.   Signature Taralynn Quiett, [facility]

## 2023-09-28 ENCOUNTER — Telehealth: Payer: Self-pay | Admitting: *Deleted

## 2023-09-28 NOTE — Transitions of Care (Post Inpatient/ED Visit) (Signed)
   09/28/2023  Name: Cody Raymond MRN: [id number] DOB: Jan 23, 1971  Today's TOC FU Call Status: Today's TOC FU Call Status:: Unsuccessful Call (2nd Attempt) Unsuccessful Call (2nd Attempt) Date: 09/28/23  Attempted to reach the patient regarding the most recent Inpatient/ED visit.  Follow Up Plan: No further outreach attempts will be made at this time. We have been unable to contact the patient.  Signature Kessa Fairbairn, [facility]

## 2023-10-06 ENCOUNTER — Ambulatory Visit: Payer: Medicare HMO | Admitting: Student

## 2023-10-13 ENCOUNTER — Other Ambulatory Visit (HOSPITAL_BASED_OUTPATIENT_CLINIC_OR_DEPARTMENT_OTHER): Payer: Self-pay

## 2023-10-23 ENCOUNTER — Ambulatory Visit: Payer: Medicare Other | Admitting: Family

## 2023-10-24 ENCOUNTER — Ambulatory Visit (INDEPENDENT_AMBULATORY_CARE_PROVIDER_SITE_OTHER): Payer: Medicare Other | Admitting: Family

## 2023-10-24 ENCOUNTER — Other Ambulatory Visit (HOSPITAL_BASED_OUTPATIENT_CLINIC_OR_DEPARTMENT_OTHER): Payer: Self-pay

## 2023-10-24 ENCOUNTER — Ambulatory Visit (HOSPITAL_BASED_OUTPATIENT_CLINIC_OR_DEPARTMENT_OTHER)
Admission: RE | Admit: 2023-10-24 | Discharge: 2023-10-24 | Disposition: A | Discharge status: Home / Self Care | Payer: Medicare HMO | Source: Ambulatory Visit | Attending: Family | Admitting: Family

## 2023-10-24 VITALS — BP 122/83 | HR 84 | Temp 98.9°F | Resp 16 | Ht 68.0 in | Wt 204.0 lb

## 2023-10-24 DIAGNOSIS — J209 Acute bronchitis, unspecified: Secondary | ICD-10-CM | POA: Insufficient documentation

## 2023-10-24 DIAGNOSIS — R059 Cough, unspecified: Secondary | ICD-10-CM | POA: Diagnosis not present

## 2023-10-24 MED ORDER — AZITHROMYCIN 250 MG PO TABS
ORAL_TABLET | ORAL | 0 refills | Status: AC
  Filled 2023-10-24 (×2): qty 6, 5d supply, fill #0

## 2023-10-24 MED ORDER — AZITHROMYCIN 250 MG PO TABS: ORAL_TABLET | ORAL | 0 refills | Status: DC

## 2023-10-24 MED ORDER — CLONAZEPAM 1 MG PO TABS
1.0000 mg | ORAL_TABLET | Freq: Two times a day (BID) | ORAL | 0 refills | Status: DC
  Filled 2023-10-24: qty 60, 30d supply, fill #0

## 2023-10-24 MED ORDER — PREDNISONE 10 MG PO TABS: ORAL_TABLET | ORAL | 0 refills | Status: DC

## 2023-10-24 MED ORDER — PREDNISONE 10 MG PO TABS
ORAL_TABLET | ORAL | 0 refills | Status: DC
  Filled 2023-10-24 (×2): qty 20, 8d supply, fill #0

## 2023-10-24 NOTE — Assessment & Plan Note (Signed)
Suspect that this is complication of the flu as he has had flu like symptoms x 6 days and grandchildren tested positive for flu. Will continue advair, nebs/MDI and add a prednisone taper. Rx with empiric zpak and obtain CXR to rule out PNA.
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
## 2023-10-24 NOTE — Patient Instructions (Signed)
 VISIT SUMMARY:  Today, you were seen for respiratory symptoms that may indicate pneumonia. You have been experiencing fevers, nasal congestion, sore throat, lung discomfort, tightness in breathing, wheezing, and a persistent cough with occasional blood for the past six days. We also discussed your anxiety and recent insurance issues.  YOUR PLAN:  -Bronchitis:  To address your symptoms, we have ordered a chest x-ray to rule out pneumonia and prescribed a Z-Pak (antibiotic) and prednisone  (anti-inflammatory medication). Continue using Mucinex, your nebulizer, Flonase , and Advair  as needed.  -ANXIETY: Anxiety is a feeling of worry or fear that can affect daily activities. We have refilled your Clonazepam  prescription to help manage your anxiety symptoms.  -INSURANCE ISSUES: You experienced a lapse in insurance coverage, which affected your access to medications. We have ensured that all your prescriptions are sent to the pharmacy located in the same building for your convenience.  INSTRUCTIONS:  Please follow up by reviewing the chest x-ray results, which we will communicate to you via MyChart.

## 2023-11-01 ENCOUNTER — Telehealth (HOSPITAL_BASED_OUTPATIENT_CLINIC_OR_DEPARTMENT_OTHER): Payer: Self-pay

## 2023-11-01 ENCOUNTER — Ambulatory Visit (HOSPITAL_BASED_OUTPATIENT_CLINIC_OR_DEPARTMENT_OTHER): Admission: RE | Admit: 2023-11-01 | Payer: Medicare HMO | Source: Ambulatory Visit

## 2023-11-03 ENCOUNTER — Other Ambulatory Visit: Payer: Self-pay | Admitting: Family

## 2023-11-20 ENCOUNTER — Other Ambulatory Visit (HOSPITAL_BASED_OUTPATIENT_CLINIC_OR_DEPARTMENT_OTHER): Payer: Self-pay

## 2023-11-21 ENCOUNTER — Other Ambulatory Visit (HOSPITAL_BASED_OUTPATIENT_CLINIC_OR_DEPARTMENT_OTHER): Payer: Self-pay

## 2023-11-21 ENCOUNTER — Other Ambulatory Visit: Payer: Self-pay

## 2023-11-21 ENCOUNTER — Other Ambulatory Visit: Payer: Self-pay | Admitting: Family

## 2023-11-21 DIAGNOSIS — J209 Acute bronchitis, unspecified: Secondary | ICD-10-CM

## 2023-11-21 MED ORDER — LOSARTAN POTASSIUM 25 MG PO TABS
25.0000 mg | ORAL_TABLET | Freq: Every day | ORAL | 2 refills | Status: DC
  Filled 2023-11-21: qty 90, 90d supply, fill #0

## 2023-11-21 NOTE — Telephone Encounter (Signed)
 Requesting: clonazepam 1mg   Contract: 03/10/23 UDS: 03/10/23 Last Visit: 10/24/23 Next OV: None Last Refill: 10/24/23 #60 and 0RF   Please Advise

## 2023-11-22 ENCOUNTER — Other Ambulatory Visit (HOSPITAL_BASED_OUTPATIENT_CLINIC_OR_DEPARTMENT_OTHER): Payer: Self-pay

## 2023-11-22 MED ORDER — CLONAZEPAM 1 MG PO TABS
1.0000 mg | ORAL_TABLET | Freq: Two times a day (BID) | ORAL | 0 refills | Status: DC
  Filled 2023-11-22: qty 60, 30d supply, fill #0

## 2023-11-22 MED ORDER — LOSARTAN POTASSIUM 25 MG PO TABS
25.0000 mg | ORAL_TABLET | Freq: Every day | ORAL | 2 refills | Status: DC
  Filled 2023-11-22 – 2024-01-12 (×3): qty 90, 90d supply, fill #0

## 2023-11-22 NOTE — Addendum Note (Signed)
 Addended by: Adriana Simas, Domonik Levario L on: 11/22/2023 07:57 AM   Modules accepted: Orders

## 2023-11-30 ENCOUNTER — Other Ambulatory Visit (HOSPITAL_BASED_OUTPATIENT_CLINIC_OR_DEPARTMENT_OTHER): Payer: Self-pay

## 2023-12-05 ENCOUNTER — Other Ambulatory Visit: Payer: Self-pay | Admitting: Family

## 2023-12-14 ENCOUNTER — Ambulatory Visit: Payer: Medicare HMO

## 2023-12-14 DIAGNOSIS — Z0000 Encounter for general adult medical examination without abnormal findings: Secondary | ICD-10-CM

## 2023-12-14 NOTE — Patient Instructions (Signed)
 Cody Raymond , Thank you for taking time to come for your Medicare Wellness Visit. I appreciate your ongoing commitment to your health goals. Please review the following plan we discussed and let me know if I can assist you in the future.   Referrals/Orders/Follow-Ups/Clinician Recommendations: Please remember to make your Diabetic eye appointment/get your vaccines update as soon as you can at your next visit.  This is a list of the screening recommended for you and due dates:  Health Maintenance  Topic Date Due   Pneumococcal Vaccination (2 of 2 - PCV) 08/20/2020   Zoster (Shingles) Vaccine (1 of 2) Never done   DTaP/Tdap/Td vaccine (2 - Td or Tdap) 04/04/2022   Eye exam for diabetics  04/08/2023   COVID-19 Vaccine (4 - 2024-25 season) 12/29/2024*   Hemoglobin A1C  01/21/2024   Yearly kidney health urinalysis for diabetes  03/09/2024   Complete foot exam   03/09/2024   Yearly kidney function blood test for diabetes  09/24/2024   Cologuard (Stool DNA test)  12/06/2024   Medicare Annual Wellness Visit  12/13/2024   Flu Shot  Completed   Hepatitis C Screening  Completed   HIV Screening  Completed   HPV Vaccine  Aged Out  *Topic was postponed. The date shown is not the original due date.    Advanced directives: (Copy Requested) Please bring a copy of your health care power of attorney and living will to the office to be added to your chart at your convenience. You can mail to [facility] [address] [place], [place] [address] or email to [email or web address]  Next Medicare Annual Wellness Visit scheduled for next year: Yes

## 2023-12-14 NOTE — Progress Notes (Signed)
 Subjective:   Cody Raymond is a 53 y.o. who presents for a Medicare Wellness preventive visit.  Visit Complete: Virtual I connected with  Brynda Peon on 12/14/23 by a audio enabled telemedicine application and verified that I am speaking with the correct person using two identifiers.  Patient Location: Home  Provider Location: Home Office  I discussed the limitations of evaluation and management by telemedicine. The patient expressed understanding and agreed to proceed.  Vital Signs: Because this visit was a virtual/telehealth visit, some criteria may be missing or patient reported. Any vitals not documented were not able to be obtained and vitals that have been documented are patient reported.  VideoDeclined- This patient declined Librarian, academic. Therefore the visit was completed with audio only.  Persons Participating in Visit: Patient.  AWV Questionnaire: No: Patient Medicare AWV questionnaire was not completed prior to this visit.  Cardiac Risk Factors include: advanced age (>69men, >51 women);diabetes mellitus;male gender;obesity (BMI >30kg/m2)     Objective:    Today's Vitals   12/14/23 1429  PainSc: 8    There is no height or weight on file to calculate BMI.     12/14/2023    2:38 PM 09/25/2023    4:56 PM 12/12/2022    3:01 PM 06/02/2022   11:41 AM 12/06/2021   10:57 AM 12/28/2019    1:21 PM 05/27/2019   10:00 PM  Advanced Directives  Does Patient Have a Medical Advance Directive? Yes No Yes No Yes No No  Type of Forensic scientist of [employer] Power of Attorney    Copy of Healthcare Power of Attorney in Chart? No - copy requested  No - copy requested  Yes - validated most recent copy scanned in chart (See row information)    Would patient like information on creating a medical advance directive?  No - Patient declined    No - Patient declined No - Guardian declined     Current Medications (verified) Outpatient Encounter Medications as of 12/14/2023  Medication Sig   Accu-Chek Softclix Lancets lancets TEST BLOOD SUGAR TWO TIMES DAILY AS DIRECTED   albuterol (PROVENTIL) (2.5 MG/3ML) 0.083% nebulizer solution Take 3 mLs (2.5 mg total) by nebulization every 6 (six) hours as needed for wheezing or shortness of breath.   albuterol (VENTOLIN HFA) 108 (90 Base) MCG/ACT inhaler Inhale 2 puffs into the lungs every 6 (six) hours as needed for wheezing or shortness of breath.   Ascorbic Acid (VITAMIN C) 1000 MG tablet Take 1,000 mg by mouth daily.   aspirin EC 81 MG EC tablet Take 1 tablet (81 mg total) by mouth daily.   atorvastatin (LIPITOR) 80 MG tablet Take 1 tablet by mouth daily.   betamethasone dipropionate (DIPROLENE) 0.05 % cream Apply topically 2 (two) times daily.   Blood Glucose Monitoring Suppl (ACCU-CHEK GUIDE) w/Device KIT USE AS DIRECTED   cetirizine (ZYRTEC) 10 MG tablet Take 10 mg by mouth daily.   clonazePAM (KLONOPIN) 1 MG tablet Take 1 tablet (1 mg total) by mouth 2 (two) times daily.   DULoxetine (CYMBALTA) 30 MG capsule TAKE 1 CAPSULE EVERY DAY   DULoxetine (CYMBALTA) 60 MG capsule Take 1 capsule by mouth daily.   ezetimibe (ZETIA) 10 MG tablet Take 1 tablet by mouth daily.   fluticasone (FLONASE) 50 MCG/ACT nasal spray USE 2 SPRAYS IN EACH NOSTRIL ONE TIME DAILY   fluticasone-salmeterol (ADVAIR) 250-50 MCG/ACT AEPB INHALE 1 PUFF INTO THE LUNGS IN  THE MORNING AND AT BEDTIME.   gabapentin (NEURONTIN) 300 MG capsule TAKE 1 CAPSULE AT 6:00AM, 11:30AM, 4:30PM AND TAKE 3 CAPSULES AT 10:00PM   glucose blood (ONETOUCH VERIO) test strip TEST BLOOD SUGAR THREE TIMES DAILY   glucose blood (ONETOUCH VERIO) test strip    ibuprofen (IBU) 800 MG tablet    Icosapent Ethyl (VASCEPA) 1 g CAPS Take 2 capsules (2 g total) by mouth 2 (two) times daily.   ipratropium (ATROVENT) 0.03 % nasal spray Place into the nose.   ipratropium-albuterol (DUONEB) 0.5-2.5 (3)  MG/3ML SOLN Inhale into the lungs.   ketoconazole (NIZORAL) 2 % shampoo Apply topically 2 (two) times a week.   losartan (COZAAR) 25 MG tablet Take 1 tablet (25 mg total) by mouth daily.   meloxicam (MOBIC) 7.5 MG tablet TAKE 1 TABLET DAILY AS NEEDED FOR PAIN   metFORMIN (GLUCOPHAGE-XR) 500 MG 24 hr tablet Take 4 tablets (2,000 mg total) by mouth daily with breakfast.   Metoprolol Tartrate 75 MG TABS Take by mouth.   montelukast (SINGULAIR) 10 MG tablet Take 1 tablet by mouth at bedtime.   Multiple Vitamin (MULTIVITAMIN WITH MINERALS) TABS tablet Take 1 tablet by mouth daily.   nitroGLYCERIN (NITROSTAT) 0.4 MG SL tablet DISSOLVE 1 TABLET UNDER THE TONGUE EVERY 5 MINUTES AS NEEDED FOR CHEST PAIN   pantoprazole (PROTONIX) 40 MG tablet Take 1 tablet by mouth daily.   predniSONE (DELTASONE) 10 MG tablet 4 tabs by mouth once daily for 2 days, then 3 tabs daily x 2 days, then 2 tabs daily x 2 days, then 1 tab daily x 2 days   sucralfate (CARAFATE) 1 g tablet Take 1 tablet (1 g total) by mouth in the morning, at noon, and at bedtime.   tamsulosin (FLOMAX) 0.4 MG CAPS capsule Take 1 capsule by mouth daily.   tiZANidine (ZANAFLEX) 2 MG tablet TAKE 1 TABLET EVERY 6 HOURS AS NEEDED FOR MUSCLE SPASM(S)   traMADol (ULTRAM) 50 MG tablet Take 1 tablet (50 mg total) by mouth 2 (two) times daily as needed.   traZODone (DESYREL) 100 MG tablet Take 1 tablet by mouth at bedtime.   No facility-administered encounter medications on file as of 12/14/2023.    Allergies (verified) Diphenhydramine, Diphenhydramine hcl, and Benadryl [diphenhydramine hcl]   History: Past Medical History:  Diagnosis Date   Aneurysm (HCC)    Asthma    Blood transfusion    CAD (coronary artery disease), non obstructive on cath 2011 04/05/2012   Coronary artery disease    Diabetes mellitus    Diabetes mellitus without complication (HCC)    Enlarged prostate    GERD (gastroesophageal reflux disease)    H/O syncope    Heart disease     History of repair of patent ductus arteriosus 07/23/2016   Hyperlipidemia    Hypertension    Neuromuscular disorder (HCC)    DJD   Refusal of blood transfusions as patient is Jehovah's Witness    Past Surgical History:  Procedure Laterality Date   APPENDECTOMY     BACK SURGERY     CARDIAC CATHETERIZATION  2007   [facility] Cardiac Cath [facility]), with RCA 30% narrowing likely due to catheter induced spasm in 09/02/10.   CARDIAC CATHETERIZATION  09/02/2010   mod. nonobstructive disease in the RCA and CX, tortuous LAD   COLONOSCOPY W/ POLYPECTOMY  03/17/11   diminutive polyp   LOOP RECORDER EXPLANT N/A 01/14/2014   Procedure: LOOP RECORDER EXPLANT;  Surgeon: Thurmon Fair, MD;  Location:  [facility] CATH LAB;  Service: Cardiovascular;  Laterality: N/A;   LOOP RECORDER IMPLANT  04/06/2012   Reveal XT 4529   LOOP RECORDER IMPLANT N/A 04/06/2012   Procedure: LOOP RECORDER IMPLANT;  Surgeon: Thurmon Fair, MD;  Location: [facility] CATH LAB;  Service: Cardiovascular;  Laterality: N/A;   NM MYOCAR PERF WALL MOTION  01/25/2008   mild anteroapical wall ischemia   PATENT DUCTUS ARTERIOUS REPAIR     at age 53   Family History  Problem Relation Age of Onset   Colon cancer Paternal Grandfather    Prostate cancer Paternal Grandfather    Aneurysm Father    Heart attack Father 56   Coronary artery disease Father    Colon polyps Mother    Heart disease Mother    Coronary artery disease Mother    Migraines Mother    Breast cancer Maternal Grandmother    Colon cancer Paternal Uncle        x 2   Stomach cancer Brother    Liver disease Other        unsure who it was   Allergies Daughter    Social History   Socioeconomic History   Marital status: Married    Spouse name: Not on file   Number of children: Not on file   Years of education: Not on file   Highest education level: Not on file  Occupational History   Not on file  Tobacco Use   Smoking status: Never   Smokeless tobacco: Never  Vaping  Use   Vaping status: Never Used  Substance and Sexual Activity   Alcohol use: No   Drug use: No   Sexual activity: Yes  Other Topics Concern   Not on file  Social History Narrative   ** Merged History Encounter **       Holter monitor 08/2010: PVCs and sinus tachy.   Sleep Study (02/2008): mild sleep apnea, no indication for CPAP.   Social Drivers of Health   Financial Resource Strain: Medium Risk (12/14/2023)   Overall Financial Resource Strain (CARDIA)    Difficulty of Paying Living Expenses: Somewhat hard  Food Insecurity: Food Insecurity Present (12/14/2023)   Hunger Vital Sign    Worried About Running Out of Food in the Last Year: Often true    Ran Out of Food in the Last Year: Often true  Transportation Needs: No Transportation Needs (12/14/2023)   PRAPARE - Administrator, Civil Service (Medical): No    Lack of Transportation (Non-Medical): No  Physical Activity: Sufficiently Active (12/14/2023)   Exercise Vital Sign    Days of Exercise per Week: 3 days    Minutes of Exercise per Session: 50 min  Stress: No Stress Concern Present (12/14/2023)   [employer] of Occupational Health - Occupational Stress Questionnaire    Feeling of Stress : Not at all  Social Connections: Moderately Integrated (12/14/2023)   Social Connection and Isolation Panel [NHANES]    Frequency of Communication with Friends and Family: More than three times a week    Frequency of Social Gatherings with Friends and Family: More than three times a week    Attends Religious Services: 1 to 4 times per year    Active Member of [employer] or Organizations: No    Attends Banker Meetings: Never    Marital Status: Married    Tobacco Counseling Counseling given: Not Answered    Clinical Intake:  Pre-visit preparation completed: Yes  Pain : 0-10 (chronic  pain neck/back) Pain Score: 8  (between 6-9/better after taking pain meds) Pain Type: Chronic pain Pain Relieving Factors:  pain meds/tramdol Effect of Pain on Daily Activities: sometimes  Pain Relieving Factors: pain meds/tramdol  Diabetes: Yes CBG done?: Yes (124 lunch time)  Lab Results  Component Value Date   HGBA1C 7.6 Repeated and verified X2. (H) 07/24/2023   HGBA1C 6.1 (H) 03/10/2023   HGBA1C 6.2 05/11/2022     How often do you need to have someone help you when you read instructions, pamphlets, or other written materials from your doctor or pharmacy?: 1 - Never  Interpreter Needed?: No  Information entered by :: Zane Herald T/CMA   Activities of Daily Living     12/14/2023    2:34 PM  In your present state of health, do you have any difficulty performing the following activities:  Hearing? 1  Comment will get hearing at the next visit per pt  Vision? 0  Difficulty concentrating or making decisions? 0  Walking or climbing stairs? 1  Comment wife helps when needed  Dressing or bathing? 1  Comment wife helps when needed  Doing errands, shopping? 0  Preparing Food and eating ? N  Using the Toilet? N  In the past six months, have you accidently leaked urine? N  Do you have problems with loss of bowel control? N  Managing your Medications? N  Managing your Finances? N  Housekeeping or managing your Housekeeping? N  Comment wife helps when needed    Patient Care Team: Sandford Craze, NP as PCP - General (Internal Medicine) Runell Gess, MD as PCP - Cardiology (Cardiology) Thurmon Fair, MD as Consulting Physician (Cardiology) Wynn Banker Victorino Sparrow, MD as Consulting Physician (Pain Medicine) Jones Bales, NP as Nurse Practitioner (Physical Medicine and Rehabilitation) Nelson Chimes, MD as Consulting Physician (Ophthalmology) Sandford Craze, NP (Internal Medicine) Luciana Axe Alford Highland, MD as Consulting Physician (Ophthalmology)  Indicate any recent Medical Services you may have received from other than [facility] providers in the past year (date may be approximate).      Assessment:   This is a routine wellness examination for Dailon.  Hearing/Vision screen Hearing Screening - Comments:: Pt denies hearing/will get hearing test at next visit   Goals Addressed   None    Depression Screen     12/14/2023    2:41 PM 07/24/2023   12:13 PM 03/10/2023    4:39 PM 02/17/2023    3:17 PM 12/29/2022    2:29 PM 12/21/2022    1:05 PM 12/12/2022    3:07 PM  PHQ 2/9 Scores  PHQ - 2 Score 6 4 4 2 3  0 2  PHQ- 9 Score 12 14 14         Fall Risk     12/14/2023    2:41 PM 07/24/2023   12:11 PM 03/10/2023    3:36 PM 02/17/2023    3:16 PM 12/29/2022    2:29 PM  Fall Risk   Falls in the past year? 0 1 1 0 1  Number falls in past yr: 0 0 1 0 0  Injury with Fall? 0 0 1 0 0  Risk for fall due to : No Fall Risks No Fall Risks No Fall Risks    Follow up Falls prevention discussed;Falls evaluation completed Falls evaluation completed Falls evaluation completed      MEDICARE RISK AT HOME:  Medicare Risk at Home Any stairs in or around the home?: Yes If so, are there any without handrails?:  Yes Home free of loose throw rugs in walkways, pet beds, electrical cords, etc?: Yes Adequate lighting in your home to reduce risk of falls?: Yes Life alert?: No Use of a cane, walker or w/c?: Yes Grab bars in the bathroom?: No Shower chair or bench in shower?: Yes (wife helps when needed) Elevated toilet seat or a handicapped toilet?: No  TIMED UP AND GO:  Was the test performed?  No  Cognitive Function: 6CIT completed    04/20/2016   10:07 AM  MMSE - Mini Mental State Exam  Orientation to time 4  Orientation to Place 5  Registration 3  Attention/ Calculation 5  Recall 1  Language- name 2 objects 2  Language- repeat 1  Language- follow 3 step command 3  Language- read & follow direction 1  Write a sentence 1  Copy design 1  Total score 27        12/14/2023    2:51 PM 12/12/2022    3:14 PM  6CIT Screen  What Year? 0 points 0 points  What month? 0 points 0 points   What time? 0 points 0 points  Count back from 20 0 points 2 points  Months in reverse 0 points 0 points  Repeat phrase 8 points 4 points  Total Score 8 points 6 points    Immunizations Immunization History  Administered Date(s) Administered   Influenza, Quadrivalent, Recombinant, Inj, Pf 07/05/2019   Influenza, Seasonal, Injecte, Preservative Fre 10/22/2012, 07/24/2023   Influenza,inj,Quad PF,6+ Mos 07/29/2013, 07/08/2014, 06/20/2015, 09/30/2015, 06/08/2016, 07/25/2017, 07/31/2018, 07/22/2022   Influenza,inj,Quad PF,6-35 Mos 07/09/2019   PFIZER(Purple Top)SARS-COV-2 Vaccination 12/05/2019, 12/30/2019, 08/18/2020   Pneumococcal Polysaccharide-23 08/20/2014, 08/21/2019   Tdap 04/04/2012    Screening Tests Health Maintenance  Topic Date Due   Pneumococcal Vaccine 12-39 Years old (2 of 2 - PCV) 08/20/2020   Zoster Vaccines- Shingrix (1 of 2) Never done   DTaP/Tdap/Td (2 - Td or Tdap) 04/04/2022   OPHTHALMOLOGY EXAM  04/08/2023   COVID-19 Vaccine (4 - 2024-25 season) 12/29/2024 (Originally 05/21/2023)   HEMOGLOBIN A1C  01/21/2024   Diabetic kidney evaluation - Urine ACR  03/09/2024   FOOT EXAM  03/09/2024   Diabetic kidney evaluation - eGFR measurement  09/24/2024   Fecal DNA (Cologuard)  12/06/2024   Medicare Annual Wellness (AWV)  12/13/2024   INFLUENZA VACCINE  Completed   Hepatitis C Screening  Completed   HIV Screening  Completed   HPV VACCINES  Aged Out    Health Maintenance  Health Maintenance Due  Topic Date Due   Pneumococcal Vaccine 71-9 Years old (2 of 2 - PCV) 08/20/2020   Zoster Vaccines- Shingrix (1 of 2) Never done   DTaP/Tdap/Td (2 - Td or Tdap) 04/04/2022   OPHTHALMOLOGY EXAM  04/08/2023   Health Maintenance Items Addressed: See Nurse Notes  Additional Screening:  Vision Screening: Recommended annual ophthalmology exams for early detection of glaucoma and other disorders of the eye.  Dental Screening: Recommended annual dental exams for proper oral  hygiene  Community Resource Referral / Chronic Care Management: CRR required this visit?  Yes   CCM required this visit?  No     Plan:     I have personally reviewed and noted the following in the patient's chart:   Medical and social history Use of alcohol, tobacco or illicit drugs  Current medications and supplements including opioid prescriptions. Patient is not currently taking opioid prescriptions. Functional ability and status Nutritional status Physical activity Advanced directives List of other  physicians Hospitalizations, surgeries, and ER visits in previous 12 months Vitals Screenings to include cognitive, depression, and falls Referrals and appointments  In addition, I have reviewed and discussed with patient certain preventive protocols, quality metrics, and best practice recommendations. A written personalized care plan for preventive services as well as general preventive health recommendations were provided to patient.     Arta Silence, CMA   12/14/2023   After Visit Summary: (MyChart) Due to this being a telephonic visit, the after visit summary with patients personalized plan was offered to patient via MyChart   Notes:  Pt was encouraged to make his diabetic eye exam when he can, as well as to get his vaccines updated. Pt have some SDOH concerns, [id number] was placed.

## 2023-12-19 ENCOUNTER — Telehealth: Payer: Self-pay

## 2023-12-19 NOTE — Progress Notes (Signed)
 Complex Care Management Note  Care Guide Note 12/19/2023 Name: Cody Raymond MRN: [id number] DOB: 20-Sep-1970  Cody Raymond is a 53 y.o. year old male who sees Sandford Craze, NP for primary care. I reached out to [employer] by phone today to offer complex care management services.  [name] was given information about Complex Care Management services today including:   The Complex Care Management services include support from the care team which includes your Nurse Care Manager, Clinical Social Worker, or Pharmacist.  The Complex Care Management team is here to help remove barriers to the health concerns and goals most important to you. Complex Care Management services are voluntary, and the patient may decline or stop services at any time by request to their care team member.   Complex Care Management Consent Status: Patient agreed to services and verbal consent obtained.   Follow up plan:  Telephone appointment with complex care management team member scheduled for:  12/21/23 at 9:30 a.m.   Encounter Outcome:  Patient Scheduled  [facility]  [facility], [facility] Care Management Assistant Direct Dial: [phone]  Fax: [phone]

## 2023-12-21 ENCOUNTER — Ambulatory Visit: Payer: Self-pay | Admitting: Licensed Clinical Social Worker

## 2023-12-21 NOTE — Patient Outreach (Signed)
 Care Coordination   Initial Visit Note   12/21/2023 Name: Cody Raymond MRN: [id number] DOB: [birth year]  Cody Raymond is a 53 y.o. year old male who sees Sandford Craze, NP for primary care. I spoke with  Cody Raymond by phone today.  What matters to the patients health and wellness today?  Food insecurities, financial strain, utilities and hearing aids.     Goals Addressed             This Visit's Progress    Care Coordination Activities       Care Coordination Interventions: Patient stated that he has a household of 7 people that includes him, his wife, is 43 y/o daughter and his other adult daughter with her 3 children and she is about to move out, but food has bee an issue and that it was and issue before she moved in and that they can't get food stamps because of income. SW educated on the GGFF and will mail some food pantry resources. Patient also needed resources for hearing aid for his wife and SW will mail Patient stated that his rent keeps going up and the utilities are high but right now that is under control, SW will mail  utility resources also. SW completed SDOH and no other needs at this time. SW will follow up on 01/04/2024 at 10:00 am        SDOH assessments and interventions completed:  Yes  SDOH Interventions Today    Flowsheet Row Most Recent Value  SDOH Interventions   Food Insecurity Interventions Community Resources Provided  [SW willmail food resoruces and educated on GGFF]  Housing Interventions Intervention Not Indicated  Transportation Interventions Intervention Not Indicated  Utilities Interventions Intervention Not Indicated        Care Coordination Interventions:  Yes, provided  Interventions Today    Flowsheet Row Most Recent Value  General Interventions   General Interventions Discussed/Reviewed General Interventions Discussed  [SW will mail food reources and educated on GGFF, Hearing aid resources and utility resources for  the future.]        Follow up plan: Follow up call scheduled for 01/04/2024 at 10:00 am    Encounter Outcome:  Patient Visit Completed   Jeanie Cooks, PhD [facility], [facility] Social Worker Direct Dial: [phone]  Fax: [phone]

## 2023-12-21 NOTE — Patient Instructions (Signed)
 Visit Information  Thank you for taking time to visit with me today. Please don't hesitate to contact me if I can be of assistance to you.   Following are the goals we discussed today:   Goals Addressed             This Visit's Progress    Care Coordination Activities       Care Coordination Interventions: Patient stated that he has a household of 7 people that includes him, his wife, is 53 y/o daughter and his other adult daughter with her 3 children and she is about to move out, but food has bee an issue and that it was and issue before she moved in and that they can't get food stamps because of income. SW educated on the GGFF and will mail some food pantry resources. Patient also needed resources for hearing aid for his wife and SW will mail Patient stated that his rent keeps going up and the utilities are high but right now that is under control, SW will mail  utility resources also. SW completed SDOH and no other needs at this time. SW will follow up on 01/04/2024 at 10:00 am        Our next appointment is by telephone on 01/01/2024 at 10:00 am  Please call the care guide team at [phone] if you need to cancel or reschedule your appointment.   If you are experiencing a Mental Health or Behavioral Health Crisis or need someone to talk to, please call the Suicide and Crisis Lifeline: 988 go to [facility] Urgent [facility] [address] [place] [phone]) call 911  Patient verbalizes understanding of instructions and care plan provided today and agrees to view in MyChart. Active MyChart status and patient understanding of how to access instructions and care plan via MyChart confirmed with patient.     Jeanie Cooks, PhD [facility], [facility] Social Worker Direct Dial: [phone]  Fax: [phone]

## 2023-12-22 ENCOUNTER — Other Ambulatory Visit: Payer: Self-pay | Admitting: Family

## 2023-12-23 ENCOUNTER — Other Ambulatory Visit (HOSPITAL_BASED_OUTPATIENT_CLINIC_OR_DEPARTMENT_OTHER): Payer: Self-pay

## 2023-12-23 MED ORDER — CLONAZEPAM 1 MG PO TABS
1.0000 mg | ORAL_TABLET | Freq: Two times a day (BID) | ORAL | 0 refills | Status: DC
  Filled 2023-12-23: qty 60, 30d supply, fill #0

## 2023-12-25 ENCOUNTER — Other Ambulatory Visit (HOSPITAL_BASED_OUTPATIENT_CLINIC_OR_DEPARTMENT_OTHER): Payer: Self-pay

## 2023-12-25 ENCOUNTER — Other Ambulatory Visit: Payer: Self-pay

## 2023-12-25 NOTE — Telephone Encounter (Signed)
 Refills requested, please review

## 2023-12-26 ENCOUNTER — Other Ambulatory Visit (HOSPITAL_BASED_OUTPATIENT_CLINIC_OR_DEPARTMENT_OTHER): Payer: Self-pay

## 2023-12-26 ENCOUNTER — Other Ambulatory Visit: Payer: Self-pay

## 2023-12-26 MED ORDER — EZETIMIBE 10 MG PO TABS
10.0000 mg | ORAL_TABLET | Freq: Every day | ORAL | 1 refills | Status: DC
  Filled 2023-12-26: qty 90, 90d supply, fill #0
  Filled 2024-03-12: qty 90, 90d supply, fill #1

## 2023-12-26 MED ORDER — MONTELUKAST SODIUM 10 MG PO TABS
10.0000 mg | ORAL_TABLET | Freq: Every day | ORAL | 1 refills | Status: DC
  Filled 2023-12-26: qty 90, 90d supply, fill #0
  Filled 2024-03-12: qty 90, 90d supply, fill #1

## 2023-12-26 MED ORDER — TAMSULOSIN HCL 0.4 MG PO CAPS
0.4000 mg | ORAL_CAPSULE | Freq: Every day | ORAL | 1 refills | Status: DC
  Filled 2023-12-26: qty 90, 90d supply, fill #0
  Filled 2024-04-19: qty 90, 90d supply, fill #1

## 2023-12-26 MED ORDER — DULOXETINE HCL 60 MG PO CPEP
60.0000 mg | ORAL_CAPSULE | Freq: Every day | ORAL | 1 refills | Status: DC
  Filled 2023-12-26: qty 90, 90d supply, fill #0
  Filled 2024-01-17: qty 90, 90d supply, fill #1

## 2023-12-26 MED ORDER — TRAZODONE HCL 100 MG PO TABS
100.0000 mg | ORAL_TABLET | Freq: Every day | ORAL | 1 refills | Status: DC
  Filled 2023-12-26: qty 90, 90d supply, fill #0
  Filled 2024-03-12: qty 90, 90d supply, fill #1

## 2023-12-26 MED ORDER — PANTOPRAZOLE SODIUM 40 MG PO TBEC
40.0000 mg | DELAYED_RELEASE_TABLET | Freq: Every day | ORAL | 1 refills | Status: DC
  Filled 2023-12-26: qty 90, 90d supply, fill #0
  Filled 2024-03-12: qty 90, 90d supply, fill #1

## 2023-12-26 MED ORDER — ONETOUCH VERIO VI STRP
1.0000 | ORAL_STRIP | 2 refills | Status: AC
Start: 2023-12-26 — End: ?
  Filled 2023-12-26: qty 100, 50d supply, fill #0
  Filled 2023-12-26: qty 100, 90d supply, fill #0
  Filled 2024-01-16: qty 100, 30d supply, fill #0
  Filled 2024-03-12 – 2024-10-22 (×4): qty 100, 30d supply, fill #1

## 2023-12-26 MED ORDER — METOPROLOL TARTRATE 75 MG PO TABS
75.0000 mg | ORAL_TABLET | Freq: Every day | ORAL | 1 refills | Status: DC
  Filled 2023-12-26 – 2024-01-16 (×3): qty 90, 90d supply, fill #0
  Filled 2024-01-17: qty 90, 90d supply, fill #1

## 2023-12-26 MED ORDER — IPRATROPIUM BROMIDE 0.03 % NA SOLN
2.0000 | Freq: Two times a day (BID) | NASAL | 1 refills | Status: DC
  Filled 2023-12-26: qty 30, 75d supply, fill #0
  Filled 2024-04-19: qty 30, 75d supply, fill #1

## 2023-12-26 MED ORDER — ATORVASTATIN CALCIUM 80 MG PO TABS
80.0000 mg | ORAL_TABLET | Freq: Every day | ORAL | 1 refills | Status: DC
  Filled 2023-12-26: qty 90, 90d supply, fill #0
  Filled 2024-03-12: qty 90, 90d supply, fill #1

## 2024-01-02 ENCOUNTER — Telehealth: Payer: Self-pay | Admitting: *Deleted

## 2024-01-02 NOTE — Progress Notes (Signed)
 Complex Care Management Care Guide Note  01/02/2024 Name: Warner Laduca MRN: [id number] DOB: 1971/06/03  Koichi Platte is a 54 y.o. year old male who is a primary care patient of O'Sullivan, Melissa, NP and is actively engaged with the care management team. I reached out to Laurent Mannings by phone today to assist with re-scheduling  with the BSW.  Follow up plan: Telephone appointment with complex care management team member scheduled for:  4/21  Barnie Bora  [facility]  Value[facility]Based [facility] Institute, [facility] Guide  Direct Dial: [phone]  Fax [phone]

## 2024-01-02 NOTE — Progress Notes (Signed)
 Complex Care Management Care Guide Note  01/02/2024 Name: [name] MRN: [id number] DOB: May 08, 1971  Cody Raymond is a 53 y.o. year old male who is a primary care patient of O'Sullivan, Melissa, NP and is actively engaged with the care management team. I reached out to Husam Loss by phone today to assist with re-scheduling  with the BSW.  Follow up plan: Unsuccessful telephone outreach attempt made. A HIPAA compliant phone message was left for the patient providing contact information and requesting a return call.  Barnie Bora  [facility]  Value-Based Care Institute, [facility] Guide  Direct Dial: [phone]  Fax [phone]

## 2024-01-04 ENCOUNTER — Encounter: Admitting: Licensed Clinical Social Worker

## 2024-01-08 ENCOUNTER — Other Ambulatory Visit: Payer: Self-pay | Admitting: Licensed Clinical Social Worker

## 2024-01-08 NOTE — Patient Outreach (Signed)
 Complex Care Management   Visit Note  01/08/2024  Name:  Cody Raymond MRN: [id number] DOB: 1970-10-03  Situation: Referral received for Complex Care Management related to SDOH Barriers:  Food insecurity I obtained verbal consent from Patient.  Visit completed with patient  on the phone  Background:   Past Medical History:  Diagnosis Date   Aneurysm (HCC)    Asthma    Blood transfusion    CAD (coronary artery disease), non obstructive on cath 2011 04/05/2012   Coronary artery disease    Diabetes mellitus    Diabetes mellitus without complication (HCC)    Enlarged prostate    GERD (gastroesophageal reflux disease)    H/O syncope    Heart disease    History of repair of patent ductus arteriosus 07/23/2016   Hyperlipidemia    Hypertension    Neuromuscular disorder (HCC)    DJD   Refusal of blood transfusions as patient is Jehovah's Witness     Assessment: Patient Reported Symptoms:  Cognitive        Neurological      HEENT        Cardiovascular      Respiratory      Endocrine      Gastrointestinal        Genitourinary      Integumentary      Musculoskeletal          Psychosocial              12/14/2023    2:41 PM  Depression screen PHQ 2/9  Decreased Interest 3  Down, Depressed, Hopeless 3  PHQ - 2 Score 6  Altered sleeping 3  Tired, decreased energy 1  Change in appetite 0  Feeling bad or failure about yourself  1  Trouble concentrating 1  Moving slowly or fidgety/restless 0  PHQ-9 Score 12  Difficult doing work/chores Not difficult at all    There were no vitals filed for this visit.  Medications Reviewed Today   Medications were not reviewed in this encounter     Recommendation:   Patient is still waiting for food an community resources in the mail   Follow Up Plan:   Telephone follow up appointment date/time:  01/22/2024 at 3:00 pm   Jonda Neighbours, PhD [facility], [facility] Social Worker Direct Dial: [phone]  Fax: [phone]

## 2024-01-08 NOTE — Patient Instructions (Signed)
 Visit Information  Thank you for taking time to visit with me today. Please don't hesitate to contact me if I can be of assistance to you before our next scheduled appointment.  Your next care management appointment is by telephone on 01/22/2024 at 3:00 pm   Please call the care guide team at [phone] if you need to cancel, schedule, or reschedule an appointment.   Please call the Suicide and Crisis Lifeline: 988 go to [facility] Urgent [facility] [address], [place] [phone]) call 911 if you are experiencing a Mental Health or Behavioral Health Crisis or need someone to talk to.  Jonda Neighbours, PhD [facility], [facility] Social Worker Direct Dial: [phone]  Fax: [phone]

## 2024-01-12 ENCOUNTER — Other Ambulatory Visit (HOSPITAL_BASED_OUTPATIENT_CLINIC_OR_DEPARTMENT_OTHER): Payer: Self-pay

## 2024-01-14 ENCOUNTER — Other Ambulatory Visit: Payer: Self-pay | Admitting: Family

## 2024-01-14 ENCOUNTER — Other Ambulatory Visit: Payer: Self-pay | Admitting: Adult Health

## 2024-01-16 ENCOUNTER — Ambulatory Visit: Admitting: Family

## 2024-01-16 ENCOUNTER — Other Ambulatory Visit (HOSPITAL_BASED_OUTPATIENT_CLINIC_OR_DEPARTMENT_OTHER): Payer: Self-pay

## 2024-01-17 ENCOUNTER — Other Ambulatory Visit (HOSPITAL_BASED_OUTPATIENT_CLINIC_OR_DEPARTMENT_OTHER): Payer: Self-pay

## 2024-01-17 ENCOUNTER — Other Ambulatory Visit (HOSPITAL_COMMUNITY): Payer: Self-pay

## 2024-01-18 ENCOUNTER — Other Ambulatory Visit (HOSPITAL_COMMUNITY): Payer: Self-pay

## 2024-01-22 ENCOUNTER — Other Ambulatory Visit: Payer: Self-pay | Admitting: Licensed Clinical Social Worker

## 2024-01-22 NOTE — Patient Outreach (Signed)
 Complex Care Management   Visit Note  01/22/2024  Name:  Cody Raymond MRN: [id number] DOB: Dec 31, 1970  Situation: Referral received for Complex Care Management related to  Medicaid  I obtained verbal consent from Patient.  Visit completed with patient  on the phone  Background:   Past Medical History:  Diagnosis Date   Aneurysm (HCC)    Asthma    Blood transfusion    CAD (coronary artery disease), non obstructive on cath 2011 04/05/2012   Coronary artery disease    Diabetes mellitus    Diabetes mellitus without complication (HCC)    Enlarged prostate    GERD (gastroesophageal reflux disease)    H/O syncope    Heart disease    History of repair of patent ductus arteriosus 07/23/2016   Hyperlipidemia    Hypertension    Neuromuscular disorder (HCC)    DJD   Refusal of blood transfusions as patient is Jehovah's Witness     Assessment: Patient Reported Symptoms:  Cognitive        Neurological      HEENT        Cardiovascular      Respiratory      Endocrine      Gastrointestinal        Genitourinary      Integumentary      Musculoskeletal          Psychosocial       Quality of Family Relationships: supportive, involved, helpful Do you feel physically threatened by others?: No      12/14/2023    2:41 PM  Depression screen PHQ 2/9  Decreased Interest 3  Down, Depressed, Hopeless 3  PHQ - 2 Score 6  Altered sleeping 3  Tired, decreased energy 1  Change in appetite 0  Feeling bad or failure about yourself  1  Trouble concentrating 1  Moving slowly or fidgety/restless 0  PHQ-9 Score 12  Difficult doing work/chores Not difficult at all    There were no vitals filed for this visit.  Medications Reviewed Today   Medications were not reviewed in this encounter     Recommendation:   Medicaid walk in clinic follow up, WS will send a message for the Medicaid clinic to follow up with patient   Follow Up Plan:   Telephone follow up appointment  date/time:  02/13/2024 at 11: 00 am  Jonda Neighbours, PhD [facility], [facility] Social Worker Direct Dial: [phone]  Fax: [phone]

## 2024-01-22 NOTE — Patient Instructions (Signed)
 Visit Information  Thank you for taking time to visit with me today. Please don't hesitate to contact me if I can be of assistance to you before our next scheduled appointment.  Your next care management appointment is by telephone on 02/13/2024 at 11:00 am  Please call the care guide team at [phone] if you need to cancel, schedule, or reschedule an appointment.   Please call the Suicide and Crisis Lifeline: 988 go to [facility] Urgent [facility] [address], [place] [phone]) call 911 if you are experiencing a Mental Health or Behavioral Health Crisis or need someone to talk to.  Jonda Neighbours, PhD [facility], [facility] Social Worker Direct Dial: [phone]  Fax: [phone]

## 2024-01-23 ENCOUNTER — Telehealth: Payer: Self-pay | Admitting: Family

## 2024-01-23 ENCOUNTER — Other Ambulatory Visit (HOSPITAL_BASED_OUTPATIENT_CLINIC_OR_DEPARTMENT_OTHER): Payer: Self-pay

## 2024-01-23 ENCOUNTER — Ambulatory Visit (HOSPITAL_BASED_OUTPATIENT_CLINIC_OR_DEPARTMENT_OTHER)

## 2024-01-23 ENCOUNTER — Encounter: Payer: Self-pay | Admitting: Family

## 2024-01-23 ENCOUNTER — Ambulatory Visit: Admitting: Family

## 2024-01-23 VITALS — BP 109/71 | HR 75 | Temp 98.5°F | Resp 16 | Ht 68.0 in | Wt 208.0 lb

## 2024-01-23 DIAGNOSIS — J45901 Unspecified asthma with (acute) exacerbation: Secondary | ICD-10-CM | POA: Diagnosis not present

## 2024-01-23 DIAGNOSIS — J4540 Moderate persistent asthma, uncomplicated: Secondary | ICD-10-CM | POA: Diagnosis not present

## 2024-01-23 DIAGNOSIS — I25119 Atherosclerotic heart disease of native coronary artery with unspecified angina pectoris: Secondary | ICD-10-CM | POA: Diagnosis not present

## 2024-01-23 DIAGNOSIS — E1142 Type 2 diabetes mellitus with diabetic polyneuropathy: Secondary | ICD-10-CM | POA: Insufficient documentation

## 2024-01-23 DIAGNOSIS — I10 Essential (primary) hypertension: Secondary | ICD-10-CM | POA: Diagnosis not present

## 2024-01-23 DIAGNOSIS — R0602 Shortness of breath: Secondary | ICD-10-CM

## 2024-01-23 DIAGNOSIS — D72829 Elevated white blood cell count, unspecified: Secondary | ICD-10-CM | POA: Diagnosis not present

## 2024-01-23 DIAGNOSIS — F418 Other specified anxiety disorders: Secondary | ICD-10-CM | POA: Diagnosis not present

## 2024-01-23 DIAGNOSIS — M545 Low back pain, unspecified: Secondary | ICD-10-CM

## 2024-01-23 LAB — CBC WITH DIFFERENTIAL/PLATELET
Basophils Absolute: 0 10*3/uL (ref 0.0–0.1)
Basophils Relative: 0.6 % (ref 0.0–3.0)
Eosinophils Absolute: 0.1 10*3/uL (ref 0.0–0.7)
Eosinophils Relative: 1.1 % (ref 0.0–5.0)
HCT: 42 % (ref 39.0–52.0)
Hemoglobin: 13.7 g/dL (ref 13.0–17.0)
Lymphocytes Relative: 19.6 % (ref 12.0–46.0)
Lymphs Abs: 1.4 10*3/uL (ref 0.7–4.0)
MCHC: 32.7 g/dL (ref 30.0–36.0)
MCV: 79.5 fl (ref 78.0–100.0)
Monocytes Absolute: 0.5 10*3/uL (ref 0.1–1.0)
Monocytes Relative: 6.9 % (ref 3.0–12.0)
Neutro Abs: 5.2 10*3/uL (ref 1.4–7.7)
Neutrophils Relative %: 71.8 % (ref 43.0–77.0)
Platelets: 330 10*3/uL (ref 150.0–400.0)
RBC: 5.28 Mil/uL (ref 4.22–5.81)
RDW: 18.5 % — ABNORMAL HIGH (ref 11.5–15.5)
WBC: 7.2 10*3/uL (ref 4.0–10.5)

## 2024-01-23 LAB — COMPREHENSIVE METABOLIC PANEL WITH GFR
ALT: 17 U/L (ref 0–53)
AST: 19 U/L (ref 0–37)
Albumin: 4.3 g/dL (ref 3.5–5.2)
Alkaline Phosphatase: 115 U/L (ref 39–117)
BUN: 14 mg/dL (ref 6–23)
CO2: 30 meq/L (ref 19–32)
Calcium: 9 mg/dL (ref 8.4–10.5)
Chloride: 102 meq/L (ref 96–112)
Creatinine, Ser: 0.77 mg/dL (ref 0.40–1.50)
GFR: 102.44 mL/min (ref >60.00)
Glucose, Bld: 108 mg/dL — ABNORMAL HIGH (ref 70–99)
Potassium: 4.3 meq/L (ref 3.5–5.1)
Sodium: 139 meq/L (ref 135–145)
Total Bilirubin: 0.7 mg/dL (ref 0.2–1.2)
Total Protein: 7.1 g/dL (ref 6.0–8.3)

## 2024-01-23 LAB — D-DIMER, QUANTITATIVE: D-Dimer, Quant: 0.25 ug{FEU}/mL (ref <0.50)

## 2024-01-23 LAB — HEMOGLOBIN A1C: Hgb A1c MFr Bld: 6.5 % (ref 4.6–6.5)

## 2024-01-23 MED ORDER — METOPROLOL TARTRATE 75 MG PO TABS
1.0000 | ORAL_TABLET | Freq: Two times a day (BID) | ORAL | 0 refills | Status: DC
  Filled 2024-01-23 – 2024-03-12 (×2): qty 180, 90d supply, fill #0

## 2024-01-23 MED ORDER — DULOXETINE HCL 60 MG PO CPEP: 60.0000 mg | ORAL_CAPSULE | Freq: Every day | ORAL | 1 refills | Status: DC

## 2024-01-23 MED ORDER — METFORMIN HCL ER 500 MG PO TB24
500.0000 mg | ORAL_TABLET | Freq: Two times a day (BID) | ORAL | 0 refills | Status: DC
  Filled 2024-01-23: qty 180, 90d supply, fill #0
  Filled 2024-03-12 – 2024-04-19 (×2): qty 360, 180d supply, fill #0

## 2024-01-23 MED ORDER — GABAPENTIN 300 MG PO CAPS
ORAL_CAPSULE | ORAL | 0 refills | Status: DC
  Filled 2024-01-23 – 2024-04-19 (×3): qty 360, 90d supply, fill #0

## 2024-01-23 MED ORDER — FLUTICASONE-SALMETEROL 250-50 MCG/ACT IN AEPB
1.0000 | INHALATION_SPRAY | Freq: Two times a day (BID) | RESPIRATORY_TRACT | 1 refills | Status: DC
  Filled 2024-01-23: qty 60, 30d supply, fill #0
  Filled 2024-01-23: qty 180, 90d supply, fill #0

## 2024-01-23 MED ORDER — CLONAZEPAM 1 MG PO TABS
1.0000 mg | ORAL_TABLET | Freq: Two times a day (BID) | ORAL | 0 refills | Status: DC
  Filled 2024-01-23: qty 60, 30d supply, fill #0

## 2024-01-23 MED ORDER — MELOXICAM 7.5 MG PO TABS
7.5000 mg | ORAL_TABLET | Freq: Every day | ORAL | 0 refills | Status: DC
Start: 2024-01-23 — End: 2024-03-12
  Filled 2024-01-23: qty 90, 90d supply, fill #0

## 2024-01-23 MED ORDER — PREDNISONE 10 MG PO TABS
ORAL_TABLET | ORAL | 0 refills | Status: AC
  Filled 2024-01-23: qty 20, 8d supply, fill #0

## 2024-01-23 MED ORDER — DULOXETINE HCL 30 MG PO CPEP: 30.0000 mg | ORAL_CAPSULE | Freq: Every day | ORAL | 0 refills | Status: DC

## 2024-01-23 NOTE — Progress Notes (Signed)
 Subjective:     Patient ID: Cody Raymond, male    DOB: 1970-10-09, 53 y.o.   MRN: [id number]  Chief Complaint  Patient presents with   Shortness of Breath    Patient reports SOB with wheezing    Chest Pain    Complains of "lung pain on the left side" worst with deep breathing     HPI  Discussed the use of AI scribe software for clinical note transcription with the patient, who gave verbal consent to proceed.  History of Present Illness  Cody Hegedus "Melva Stabile" is a 53 year old male with asthma who presents with left lung pain and shortness of breath.  Intermittent left lung pain has been present for two weeks, exacerbated by deep breaths and physical activity. Shortness of breath occurs at rest and with exertion. No fever or cough is present. Asthma is a known condition, and insurance issues have impacted medication access. Current medications include metoprolol , atorvastatin , losartan , trazodone , pantoprazole , tamsulosin , Singulair , meloxicam , and gabapentin . He plans to reduce gabapentin  due to cognitive side effects.  Family stress involves his daughter's living situation and child custody issues, with an anticipated reduction in stress as she moves out in two weeks. He has been taking Cymbalta  for depression and anxiety, with a recent dose reduction improving erectile dysfunction. He also plans to discontinue testosterone supplements.  Family history includes polyps and cancer in his mother and brother, and a heart attack in another brother. Neuropathy in his feet is managed with gabapentin , and he plans to consult a specialist for foot issues, including a discrepancy in foot length causing pain.     Health Maintenance Due  Topic Date Due   Pneumococcal Vaccine 65-83 Years old (2 of 2 - PCV) 08/20/2020   Zoster Vaccines- Shingrix (1 of 2) Never done   DTaP/Tdap/Td (2 - Td or Tdap) 04/04/2022   OPHTHALMOLOGY EXAM  04/08/2023   HEMOGLOBIN A1C  01/21/2024    Past  Medical History:  Diagnosis Date   Aneurysm (HCC)    Asthma    Blood transfusion    CAD (coronary artery disease), non obstructive on cath 2011 04/05/2012   Coronary artery disease    Diabetes mellitus    Diabetes mellitus without complication (HCC)    Enlarged prostate    GERD (gastroesophageal reflux disease)    H/O syncope    Heart disease    History of repair of patent ductus arteriosus 07/23/2016   Hyperlipidemia    Hypertension    Neuromuscular disorder (HCC)    DJD   Refusal of blood transfusions as patient is Jehovah's Witness     Past Surgical History:  Procedure Laterality Date   APPENDECTOMY     BACK SURGERY     CARDIAC CATHETERIZATION  2007   Clean Cardiac Cath [facility]), with RCA 30% narrowing likely due to catheter induced spasm in 09/02/10.   CARDIAC CATHETERIZATION  09/02/2010   mod. nonobstructive disease in the RCA and CX, tortuous LAD   COLONOSCOPY W/ POLYPECTOMY  03/17/11   diminutive polyp   LOOP RECORDER EXPLANT N/A 01/14/2014   Procedure: LOOP RECORDER EXPLANT;  Surgeon: Luana Rumple, MD;  Location: [facility] CATH LAB;  Service: Cardiovascular;  Laterality: N/A;   LOOP RECORDER IMPLANT  04/06/2012   Reveal XT 4529   LOOP RECORDER IMPLANT N/A 04/06/2012   Procedure: LOOP RECORDER IMPLANT;  Surgeon: Luana Rumple, MD;  Location: [facility] CATH LAB;  Service: Cardiovascular;  Laterality: N/A;   NM MYOCAR PERF WALL MOTION  01/25/2008   mild anteroapical wall ischemia   PATENT DUCTUS ARTERIOUS REPAIR     at age 58    Family History  Problem Relation Age of Onset   Colon cancer Paternal Grandfather    Prostate cancer Paternal Grandfather    Aneurysm Father    Heart attack Father 56   Coronary artery disease Father    Colon polyps Mother    Heart disease Mother    Coronary artery disease Mother    Migraines Mother    Breast cancer Maternal Grandmother    Colon cancer Paternal Uncle        x 2   Stomach cancer Brother    Liver disease Other         unsure who it was   Allergies Daughter     Social History   Socioeconomic History   Marital status: Married    Spouse name: Not on file   Number of children: Not on file   Years of education: Not on file   Highest education level: 12th grade  Occupational History   Not on file  Tobacco Use   Smoking status: Never   Smokeless tobacco: Never  Vaping Use   Vaping status: Never Used  Substance and Sexual Activity   Alcohol use: No   Drug use: No   Sexual activity: Yes  Other Topics Concern   Not on file  Social History Narrative   ** Merged History Encounter **       Holter monitor 08/2010: PVCs and sinus tachy.   Sleep Study (02/2008): mild sleep apnea, no indication for CPAP.   Social Drivers of Health   Financial Resource Strain: Medium Risk (01/23/2024)   Overall Financial Resource Strain (CARDIA)    Difficulty of Paying Living Expenses: Somewhat hard  Food Insecurity: Food Insecurity Present (01/23/2024)   Hunger Vital Sign    Worried About Running Out of Food in the Last Year: Sometimes true    Ran Out of Food in the Last Year: Sometimes true  Transportation Needs: Unmet Transportation Needs (01/23/2024)   PRAPARE - Administrator, Civil Service (Medical): Yes    Lack of Transportation (Non-Medical): No  Physical Activity: Inactive (01/23/2024)   Exercise Vital Sign    Days of Exercise per Week: 0 days    Minutes of Exercise per Session: 50 min  Stress: Stress Concern Present (01/23/2024)   [employer] of Occupational Health - Occupational Stress Questionnaire    Feeling of Stress : Rather much  Social Connections: Moderately Isolated (01/23/2024)   Social Connection and Isolation Panel [NHANES]    Frequency of Communication with Friends and Family: Once a week    Frequency of Social Gatherings with Friends and Family: Never    Attends Religious Services: Never    Database administrator or Organizations: Yes    Attends Banker Meetings: 1 to  4 times per year    Marital Status: Married  Catering manager Violence: Not At Risk (12/21/2023)   Humiliation, Afraid, Rape, and Kick questionnaire    Fear of Current or Ex-Partner: No    Emotionally Abused: No    Physically Abused: No    Sexually Abused: No    Outpatient Medications Prior to Visit  Medication Sig Dispense Refill   Accu-Chek Softclix Lancets lancets TEST BLOOD SUGAR TWO TIMES DAILY AS DIRECTED 200 each 3   albuterol  (PROVENTIL ) (2.5 MG/3ML) 0.083% nebulizer solution Take 3 mLs (2.5 mg total) by nebulization every  6 (six) hours as needed for wheezing or shortness of breath. 75 mL 5   albuterol  (VENTOLIN  HFA) 108 (90 Base) MCG/ACT inhaler Inhale 2 puffs into the lungs every 6 (six) hours as needed for wheezing or shortness of breath. 6.7 g 5   Ascorbic Acid (VITAMIN C) 1000 MG tablet Take 1,000 mg by mouth daily.     aspirin  EC 81 MG EC tablet Take 1 tablet (81 mg total) by mouth daily. 30 tablet 0   atorvastatin  (LIPITOR ) 80 MG tablet Take 1 tablet (80 mg total) by mouth daily. 90 tablet 1   betamethasone  dipropionate (DIPROLENE ) 0.05 % cream Apply topically 2 (two) times daily. 30 g 0   Blood Glucose Monitoring Suppl (ACCU-CHEK GUIDE) w/Device KIT USE AS DIRECTED 1 kit 10   cetirizine (ZYRTEC) 10 MG tablet Take 10 mg by mouth daily.     ezetimibe  (ZETIA ) 10 MG tablet Take 1 tablet (10 mg total) by mouth daily. 90 tablet 1   fluticasone  (FLONASE ) 50 MCG/ACT nasal spray USE 2 SPRAYS IN EACH NOSTRIL ONE TIME DAILY 48 g 3   glucose blood (ACCU-CHEK GUIDE TEST) test strip TEST BLOOD SUGAR THREE TIMES DAILY 300 strip 1   glucose blood (ONETOUCH VERIO) test strip TEST BLOOD SUGAR THREE TIMES DAILY 300 strip 12   glucose blood (ONETOUCH VERIO) test strip Use as directed. 100 each 2   ipratropium (ATROVENT ) 0.03 % nasal spray Place 2 sprays into the nose 2 (two) times daily. 30 mL 1   ipratropium-albuterol  (DUONEB) 0.5-2.5 (3) MG/3ML SOLN Inhale into the lungs.     ketoconazole   (NIZORAL ) 2 % shampoo Apply topically 2 (two) times a week. 360 mL 1   losartan  (COZAAR ) 25 MG tablet Take 1 tablet (25 mg total) by mouth daily. 90 tablet 2   montelukast  (SINGULAIR ) 10 MG tablet Take 1 tablet (10 mg total) by mouth at bedtime. 90 tablet 1   Multiple Vitamin (MULTIVITAMIN WITH MINERALS) TABS tablet Take 1 tablet by mouth daily.     nitroGLYCERIN  (NITROSTAT ) 0.4 MG SL tablet DISSOLVE 1 TABLET UNDER THE TONGUE EVERY 5 MINUTES AS NEEDED FOR CHEST PAIN 25 tablet 11   pantoprazole  (PROTONIX ) 40 MG tablet Take 1 tablet (40 mg total) by mouth daily. 90 tablet 1   tamsulosin  (FLOMAX ) 0.4 MG CAPS capsule Take 1 capsule (0.4 mg total) by mouth at bedtime. 90 capsule 1   tiZANidine  (ZANAFLEX ) 2 MG tablet TAKE 1 TABLET EVERY 6 HOURS AS NEEDED FOR MUSCLE SPASM(S) 360 tablet 3   traMADol  (ULTRAM ) 50 MG tablet Take 1 tablet (50 mg total) by mouth 2 (two) times daily as needed. 60 tablet 0   traZODone  (DESYREL ) 100 MG tablet Take 1 tablet (100 mg total) by mouth at bedtime. 90 tablet 1   clonazePAM  (KLONOPIN ) 1 MG tablet Take 1 tablet (1 mg total) by mouth 2 (two) times daily. 60 tablet 0   DULoxetine  (CYMBALTA ) 30 MG capsule TAKE 1 CAPSULE EVERY DAY 90 capsule 1   DULoxetine  (CYMBALTA ) 60 MG capsule Take 1 capsule (60 mg total) by mouth daily. 90 capsule 1   fluticasone -salmeterol (ADVAIR ) 250-50 MCG/ACT AEPB INHALE 1 PUFF INTO THE LUNGS IN THE MORNING AND AT BEDTIME. 180 each 1   gabapentin  (NEURONTIN ) 300 MG capsule TAKE 1 CAPSULE AT 6:00AM, 11:30AM, 4:30PM AND TAKE 3 CAPSULES AT 10:00PM 540 capsule 1   ibuprofen (IBU) 800 MG tablet      Icosapent  Ethyl (VASCEPA ) 1 g CAPS Take 2 capsules (2 g  total) by mouth 2 (two) times daily. 120 capsule 0   meloxicam  (MOBIC ) 7.5 MG tablet TAKE 1 TABLET DAILY AS NEEDED FOR PAIN 90 tablet 1   metFORMIN  (GLUCOPHAGE -XR) 500 MG 24 hr tablet TAKE 4 TABLETS EVERY DAY WITH BREAKFAST 360 tablet 1   Metoprolol  Tartrate 75 MG TABS TAKE 1 TABLET TWICE DAILY 180  tablet 2   predniSONE  (DELTASONE ) 10 MG tablet 4 tabs by mouth once daily for 2 days, then 3 tabs daily x 2 days, then 2 tabs daily x 2 days, then 1 tab daily x 2 days 20 tablet 0   sucralfate  (CARAFATE ) 1 g tablet Take 1 tablet (1 g total) by mouth in the morning, at noon, and at bedtime. 21 tablet 0   No facility-administered medications prior to visit.    Allergies  Allergen Reactions   Diphenhydramine     Other reaction(s): confusion  Other Reaction(s): Other (See Comments)  "drives me nuts"    Other reaction(s): confusion    Other Reaction(s): Mental Status Changes   Diphenhydramine Hcl     Other Reaction(s): Mental Status Changes   Benadryl [Diphenhydramine Hcl]     "drives me nuts"    ROS See HPI    Objective:    Physical Exam Constitutional:      General: He is not in acute distress.    Appearance: He is well-developed.  HENT:     Head: Normocephalic and atraumatic.  Cardiovascular:     Rate and Rhythm: Normal rate and regular rhythm.     Heart sounds: No murmur heard. Pulmonary:     Effort: Pulmonary effort is normal. No respiratory distress.     Breath sounds: Wheezing present. No rales.  Skin:    General: Skin is warm and dry.  Neurological:     Mental Status: He is alert and oriented to person, place, and time.  Psychiatric:        Behavior: Behavior normal.        Thought Content: Thought content normal.      BP 109/71 (BP Location: Right Arm, Patient Position: Sitting, Cuff Size: Normal)   Pulse 75   Temp 98.5 F (36.9 C) (Oral)   Resp 16   Ht 5\' 8"  (1.727 m)   Wt 208 lb (94.3 kg)   SpO2 99%   BMI 31.63 kg/m  Wt Readings from Last 3 Encounters:  01/23/24 208 lb (94.3 kg)  10/24/23 204 lb (92.5 kg)  09/25/23 200 lb (90.7 kg)       Assessment & Plan:   Problem List Items Addressed This Visit       Unprioritized   CAD (coronary artery disease), non obstructive on cath 2011 (Chronic)   Pt states that he is working on getting back  in with cardiology but they are scheduling out several months due to new office/moving.      Relevant Medications   Metoprolol  Tartrate 75 MG TABS   Type 2 diabetes mellitus with diabetic polyneuropathy, without long-term current use of insulin  (HCC) - Primary   Relevant Medications   metFORMIN  (GLUCOPHAGE -XR) 500 MG 24 hr tablet   gabapentin  (NEURONTIN ) 300 MG capsule   clonazePAM  (KLONOPIN ) 1 MG tablet   DULoxetine  (CYMBALTA ) 60 MG capsule   DULoxetine  (CYMBALTA ) 30 MG capsule   Other Relevant Orders   HgB A1c   Essential hypertension   BP stable on current medications. Continue same.       Relevant Medications   Metoprolol  Tartrate 75 MG TABS  Other Relevant Orders   Comp Met (CMET)   Diabetic peripheral neuropathy (HCC)   Feeling too sedated on gabapentin  600/600/900mg . Will decrease to 300/300/600mg .       Relevant Medications   metFORMIN  (GLUCOPHAGE -XR) 500 MG 24 hr tablet   gabapentin  (NEURONTIN ) 300 MG capsule   clonazePAM  (KLONOPIN ) 1 MG tablet   DULoxetine  (CYMBALTA ) 60 MG capsule   DULoxetine  (CYMBALTA ) 30 MG capsule   Depression with anxiety   Depression is uncontrolled. Will increase cymbalta  back up to 90 mg daily as tolerated.      Relevant Medications   clonazePAM  (KLONOPIN ) 1 MG tablet   DULoxetine  (CYMBALTA ) 60 MG capsule   DULoxetine  (CYMBALTA ) 30 MG capsule   Asthma   Uncontrolled. Required steroids in February as well. Continues advair . Will repeat prednisone  taper and refer to pulmonology for further evaluation.      Relevant Medications   fluticasone -salmeterol (ADVAIR ) 250-50 MCG/ACT AEPB   predniSONE  (DELTASONE ) 10 MG tablet   Other Relevant Orders   Ambulatory referral to Pulmonology   DG Chest 2 View   Other Visit Diagnoses       Acute bilateral low back pain without sciatica       Relevant Medications   meloxicam  (MOBIC ) 7.5 MG tablet   predniSONE  (DELTASONE ) 10 MG tablet     SOB (shortness of breath)       Relevant Orders    D-Dimer, Quantitative   DG Chest 2 View     Leukocytosis, unspecified type       Relevant Orders   CBC w/Diff       I have discontinued Cody Underdown "Lenny"'s icosapent  Ethyl, sucralfate , ibuprofen, and predniSONE . I have also changed his meloxicam , Metoprolol  Tartrate, metFORMIN , and gabapentin . Additionally, I am having him start on predniSONE  and DULoxetine . Lastly, I am having him maintain his cetirizine, multivitamin with minerals, vitamin C, aspirin  EC, betamethasone  dipropionate, ketoconazole , Accu-Chek Guide, tiZANidine , OneTouch Verio, albuterol , albuterol , nitroGLYCERIN , traMADol , ipratropium-albuterol , Accu-Chek Softclix Lancets, fluticasone , losartan , ezetimibe , ipratropium, montelukast , pantoprazole , tamsulosin , traZODone , atorvastatin , OneTouch Verio, Accu-Chek Guide Test, fluticasone -salmeterol, clonazePAM , and DULoxetine .  Meds ordered this encounter  Medications   meloxicam  (MOBIC ) 7.5 MG tablet    Sig: Take 1 tablet (7.5 mg total) by mouth daily.    Dispense:  90 tablet    Refill:  0    Supervising Provider:   Randie Bustle A [[id number]]   Metoprolol  Tartrate 75 MG TABS    Sig: Take 1 tablet (75 mg total) by mouth 2 (two) times daily.    Dispense:  180 tablet    Refill:  0    Supervising Provider:   Randie Bustle A [[id number]]   metFORMIN  (GLUCOPHAGE -XR) 500 MG 24 hr tablet    Sig: Take 1 tablet (500 mg total) by mouth 2 (two) times daily with a meal.    Dispense:  360 tablet    Refill:  0    Supervising Provider:   Randie Bustle A [[id number]]   gabapentin  (NEURONTIN ) 300 MG capsule    Sig: Take 1 capsule (300 mg total) by mouth in the morning AND 1 capsule (300 mg total) daily in the afternoon AND 2 capsules (600 mg total) at bedtime.    Dispense:  360 capsule    Refill:  0    Supervising Provider:   Randie Bustle A [[id number]]   fluticasone -salmeterol (ADVAIR ) 250-50 MCG/ACT AEPB    Sig: Inhale 1 puff into the lungs in the morning and at bedtime.    Dispense:  180  each     Refill:  1    Supervising Provider:   Randie Bustle A [[id number]]   clonazePAM  (KLONOPIN ) 1 MG tablet    Sig: Take 1 tablet (1 mg total) by mouth 2 (two) times daily.    Dispense:  60 tablet    Refill:  0    Supervising Provider:   Randie Bustle A [[id number]]   DULoxetine  (CYMBALTA ) 60 MG capsule    Sig: Take 1 capsule (60 mg total) by mouth daily.    Dispense:  90 capsule    Refill:  1    Supervising Provider:   Randie Bustle A [[id number]]   predniSONE  (DELTASONE ) 10 MG tablet    Sig: Take 4 tablets (40 mg total) by mouth daily for 2 days, THEN 3 tablets (30 mg total) daily for 2 days, THEN 2 tablets (20 mg total) daily for 2 days, THEN 1 tablet (10 mg total) daily for 2 days.    Dispense:  20 tablet    Refill:  0    Supervising Provider:   Randie Bustle A [[id number]]   DULoxetine  (CYMBALTA ) 30 MG capsule    Sig: Take 1 capsule (30 mg total) by mouth daily.    Dispense:  90 capsule    Refill:  0    Supervising Provider:   Randie Bustle A [[id number]]

## 2024-01-23 NOTE — Telephone Encounter (Signed)
 See mychart.

## 2024-01-23 NOTE — Assessment & Plan Note (Signed)
 Depression is uncontrolled. Will increase cymbalta  back up to 90 mg daily as tolerated.

## 2024-01-23 NOTE — Assessment & Plan Note (Signed)
 Feeling too sedated on gabapentin  600/600/900mg . Will decrease to 300/300/600mg .

## 2024-01-23 NOTE — Patient Instructions (Signed)
 VISIT SUMMARY:  Today, you were seen for left lung pain and shortness of breath, which may be related to your asthma. We also discussed your ongoing depression, chronic pain, and neuropathy in your feet.  YOUR PLAN:  ASTHMA: You have been experiencing intermittent left lung pain and shortness of breath, which could be due to an asthma flare-up, pneumonia, or a blood clot in the lung. -We have ordered a D-dimer test and a chest x-ray to investigate further. -You have been prescribed prednisone  to help with your symptoms. -A referral to a pulmonologist has been made for specialized care.  DEPRESSION: Your depression and anxiety are being managed with Cymbalta , and you have noticed an improvement in libido after reducing the dose. -Continue taking Cymbalta  at 60 mg, with the option to increase to 90 mg if needed. -Your Cymbalta  prescription has been sent to [facility].  CHRONIC PAIN: Your chronic pain is affecting your daily activities and may be related to your mood. -Adjust your gabapentin  dosage to 3 at night, 2 in the afternoon, and 2 in the morning. -Consider Wellbutrin if the adjustment to Cymbalta  is not sufficient.  NEUROPATHY: You have neuropathy in your feet, which is being managed with gabapentin . You also have anatomical differences in your feet causing pain. -Continue taking gabapentin  as adjusted. -Use insoles for foot support. -Consider seeing a podiatrist if your symptoms persist.

## 2024-01-23 NOTE — Assessment & Plan Note (Signed)
 Uncontrolled. Required steroids in February as well. Continues advair . Will repeat prednisone  taper and refer to pulmonology for further evaluation.

## 2024-01-23 NOTE — Assessment & Plan Note (Signed)
 Pt states that he is working on getting back in with cardiology but they are scheduling out several months due to new office/moving.

## 2024-01-23 NOTE — Assessment & Plan Note (Signed)
BP stable on current medications. Continue same.  

## 2024-01-24 ENCOUNTER — Encounter: Payer: Self-pay | Admitting: Family

## 2024-01-26 ENCOUNTER — Encounter: Admitting: Physical Medicine & Rehabilitation

## 2024-02-13 ENCOUNTER — Encounter: Payer: Self-pay | Admitting: Licensed Clinical Social Worker

## 2024-02-13 ENCOUNTER — Other Ambulatory Visit: Payer: Self-pay | Admitting: Licensed Clinical Social Worker

## 2024-02-21 ENCOUNTER — Encounter: Payer: Self-pay | Admitting: Licensed Clinical Social Worker

## 2024-02-21 ENCOUNTER — Other Ambulatory Visit: Payer: Self-pay | Admitting: Licensed Clinical Social Worker

## 2024-03-06 ENCOUNTER — Other Ambulatory Visit: Payer: Self-pay | Admitting: Licensed Clinical Social Worker

## 2024-03-06 ENCOUNTER — Encounter: Payer: Self-pay | Admitting: Licensed Clinical Social Worker

## 2024-03-07 ENCOUNTER — Encounter: Payer: Self-pay | Admitting: Physical Medicine & Rehabilitation

## 2024-03-07 ENCOUNTER — Encounter: Attending: Physical Medicine & Rehabilitation | Admitting: Physical Medicine & Rehabilitation

## 2024-03-07 VITALS — BP 94/69 | HR 68 | Ht 68.0 in | Wt 207.0 lb

## 2024-03-07 DIAGNOSIS — M961 Postlaminectomy syndrome, not elsewhere classified: Secondary | ICD-10-CM | POA: Insufficient documentation

## 2024-03-07 DIAGNOSIS — M25572 Pain in left ankle and joints of left foot: Secondary | ICD-10-CM | POA: Insufficient documentation

## 2024-03-07 DIAGNOSIS — Z5181 Encounter for therapeutic drug level monitoring: Secondary | ICD-10-CM | POA: Insufficient documentation

## 2024-03-07 DIAGNOSIS — Z79891 Long term (current) use of opiate analgesic: Secondary | ICD-10-CM | POA: Insufficient documentation

## 2024-03-07 MED ORDER — TRAMADOL HCL 50 MG PO TABS: 50.0000 mg | ORAL_TABLET | Freq: Two times a day (BID) | ORAL | 0 refills | Status: DC | PRN
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
## 2024-03-11 ENCOUNTER — Other Ambulatory Visit: Payer: Self-pay | Admitting: Physical Medicine & Rehabilitation

## 2024-03-11 ENCOUNTER — Ambulatory Visit
Admission: RE | Admit: 2024-03-11 | Discharge: 2024-03-11 | Disposition: A | Discharge status: Home / Self Care | Source: Ambulatory Visit | Attending: Physical Medicine & Rehabilitation | Admitting: Physical Medicine & Rehabilitation

## 2024-03-11 DIAGNOSIS — M25572 Pain in left ankle and joints of left foot: Secondary | ICD-10-CM | POA: Diagnosis not present

## 2024-03-11 DIAGNOSIS — Z79891 Long term (current) use of opiate analgesic: Secondary | ICD-10-CM

## 2024-03-11 DIAGNOSIS — Z5181 Encounter for therapeutic drug level monitoring: Secondary | ICD-10-CM

## 2024-03-11 DIAGNOSIS — M961 Postlaminectomy syndrome, not elsewhere classified: Secondary | ICD-10-CM

## 2024-03-12 ENCOUNTER — Other Ambulatory Visit: Payer: Self-pay | Admitting: Family

## 2024-03-12 ENCOUNTER — Other Ambulatory Visit: Payer: Self-pay

## 2024-03-12 ENCOUNTER — Other Ambulatory Visit (HOSPITAL_BASED_OUTPATIENT_CLINIC_OR_DEPARTMENT_OTHER): Payer: Self-pay

## 2024-03-12 DIAGNOSIS — F418 Other specified anxiety disorders: Secondary | ICD-10-CM

## 2024-03-12 DIAGNOSIS — M545 Low back pain, unspecified: Secondary | ICD-10-CM

## 2024-03-12 MED ORDER — LOSARTAN POTASSIUM 25 MG PO TABS: 25.0000 mg | ORAL_TABLET | Freq: Every day | ORAL | 1 refills | Status: DC

## 2024-03-12 NOTE — Telephone Encounter (Signed)
 Requesting: clonazepam  1mg   Contract: 03/10/23 UDS: 03/10/23 Last Visit: 01/23/24 Next Visit: None Last Refill: 01/23/24 #60 and 9mq  Please Advise

## 2024-03-13 ENCOUNTER — Other Ambulatory Visit (HOSPITAL_BASED_OUTPATIENT_CLINIC_OR_DEPARTMENT_OTHER): Payer: Self-pay

## 2024-03-13 MED ORDER — MELOXICAM 7.5 MG PO TABS
7.5000 mg | ORAL_TABLET | Freq: Every day | ORAL | 0 refills | Status: DC
  Filled 2024-03-13 – 2024-04-19 (×2): qty 90, 90d supply, fill #0

## 2024-03-13 MED ORDER — CLONAZEPAM 1 MG PO TABS
1.0000 mg | ORAL_TABLET | Freq: Two times a day (BID) | ORAL | 0 refills | Status: DC
  Filled 2024-03-13 (×2): qty 60, 30d supply, fill #0

## 2024-03-19 ENCOUNTER — Telehealth: Payer: Self-pay | Admitting: Physical Medicine & Rehabilitation

## 2024-03-19 NOTE — Telephone Encounter (Signed)
 P called and stated refill of tramadol  wont be refilled bc of a drug interaction

## 2024-03-20 NOTE — Telephone Encounter (Signed)
 Spoke with pharmacy and Tramadol  in the mail to pt.

## 2024-03-26 NOTE — Progress Notes (Unsigned)
 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 and much worse since covid in 2022 asssoc with subjective wheeze which DR Garnette Silvan eval in 2019 with h/o wheeze x 27y and documented VCD in 03/2018    History of Present Illness  03/28/2024  Pulmonary/ 1st office eval/Dawud Mays breathing worse on advair  x 2 y Chief Complaint  Patient presents with   Asthma    Cough and dyspnea  Dyspnea:  harris teeter x 2 aisles /slowed down by back and heart  Cough: yellow mucus  Sleep: bed is flat with bunch of pillows and sev times a week  SABA use: avg twice daily  02 ldz:wnwz     No obvious day to day or daytime pattern/variability or assoc mucus plugs or hemoptysis or cp or chest tightness, subjective wheeze or overt sinus or hb symptoms.    Also denies any obvious fluctuation of symptoms with weather or environmental changes or other aggravating or alleviating factors except as outlined above   No unusual exposure hx or h/o childhood pna  or knowledge of premature birth.  Current Allergies, Complete Past Medical History, Past Surgical History, Family History, and Social History were reviewed in [employer] record.  ROS  The following are not active complaints unless bolded Hoarseness, sore throat, dysphagia, dental problems, itching, sneezing,  nasal congestion or discharge of excess mucus or purulent secretions, ear ache,   fever, chills, sweats, unintended wt loss or wt gain, classically pleuritic or exertional cp,  orthopnea pnd or arm/hand swelling  or leg swelling, presyncope, palpitations, abdominal pain, anorexia, nausea, vomiting, diarrhea  or change in bowel habits or change in bladder habits, change in stools or change in urine, dysuria, hematuria,  rash,  arthralgias, visual complaints, headache, numbness, weakness or ataxia or problems with walking or coordination,  change in mood or  memory.             Outpatient Medications Prior to Visit  Medication Sig Dispense Refill   Accu-Chek Softclix Lancets lancets TEST BLOOD SUGAR TWO TIMES DAILY AS DIRECTED 200 each 3   albuterol  (PROVENTIL ) (2.5 MG/3ML) 0.083% nebulizer solution Take 3 mLs (2.5 mg total) by nebulization every 6 (six) hours as needed for wheezing or shortness of breath. 75 mL 5   albuterol  (VENTOLIN  HFA) 108 (90 Base) MCG/ACT inhaler Inhale 2 puffs into the lungs every 6 (six) hours as needed for wheezing or shortness of breath. 6.7 g 5   Ascorbic Acid (VITAMIN C) 1000 MG tablet Take 1,000 mg by mouth daily.     aspirin  EC 81 MG EC tablet Take 1 tablet (81 mg total) by mouth daily. 30 tablet 0   atorvastatin  (LIPITOR ) 80 MG tablet Take 1 tablet (80 mg total) by mouth daily. 90 tablet 1   betamethasone  dipropionate (DIPROLENE ) 0.05 % cream Apply topically 2 (two) times daily. 30 g 0   Blood Glucose Monitoring Suppl (ACCU-CHEK GUIDE) w/Device KIT USE AS DIRECTED 1 kit 10   cetirizine (ZYRTEC) 10 MG tablet Take 10 mg by mouth daily.     clonazePAM  (KLONOPIN ) 1 MG tablet Take 1 tablet (1 mg total) by mouth 2 (two) times daily. 60 tablet 0   DULoxetine  (CYMBALTA ) 30 MG capsule Take 1  capsule (30 mg total) by mouth daily. 90 capsule 0   DULoxetine  (CYMBALTA ) 60 MG capsule Take 1 capsule (60 mg total) by mouth daily. 90 capsule 1   ezetimibe  (ZETIA ) 10 MG tablet Take 1 tablet (10 mg total) by mouth daily. 90 tablet 1   fluticasone  (FLONASE ) 50 MCG/ACT nasal spray USE 2 SPRAYS IN EACH NOSTRIL ONE TIME DAILY 48 g 3   fluticasone -salmeterol (ADVAIR ) 250-50 MCG/ACT AEPB Inhale 1 puff into the lungs in the morning and at bedtime. 180 each 1   gabapentin  (NEURONTIN ) 300 MG capsule Take 1 capsule (300 mg total) by mouth in the morning AND 1 capsule (300 mg total) daily in the afternoon AND 2  capsules (600 mg total) at bedtime. 360 capsule 0   glucose blood (ACCU-CHEK GUIDE TEST) test strip TEST BLOOD SUGAR THREE TIMES DAILY 300 strip 1   glucose blood (ONETOUCH VERIO) test strip TEST BLOOD SUGAR THREE TIMES DAILY 300 strip 12   glucose blood (ONETOUCH VERIO) test strip Use as directed. 100 each 2   ipratropium (ATROVENT ) 0.03 % nasal spray Place 2 sprays into the nose 2 (two) times daily. 30 mL 1   ipratropium-albuterol  (DUONEB) 0.5-2.5 (3) MG/3ML SOLN Inhale into the lungs.     ketoconazole  (NIZORAL ) 2 % shampoo Apply topically 2 (two) times a week. 360 mL 1   losartan  (COZAAR ) 25 MG tablet Take 1 tablet (25 mg total) by mouth daily. 90 tablet 1   meloxicam  (MOBIC ) 7.5 MG tablet Take 1 tablet (7.5 mg total) by mouth daily. 90 tablet 0   metFORMIN  (GLUCOPHAGE -XR) 500 MG 24 hr tablet Take 1 tablet (500 mg total) by mouth 2 (two) times daily with a meal. 360 tablet 0   Metoprolol  Tartrate 75 MG TABS Take 1 tablet (75 mg total) by mouth 2 (two) times daily. 180 tablet 0   montelukast  (SINGULAIR ) 10 MG tablet Take 1 tablet (10 mg total) by mouth at bedtime. 90 tablet 1   Multiple Vitamin (MULTIVITAMIN WITH MINERALS) TABS tablet Take 1 tablet by mouth daily.     nitroGLYCERIN  (NITROSTAT ) 0.4 MG SL tablet DISSOLVE 1 TABLET UNDER THE TONGUE EVERY 5 MINUTES AS NEEDED FOR CHEST PAIN 25 tablet 11   pantoprazole  (PROTONIX ) 40 MG tablet Take 1 tablet (40 mg total) by mouth daily. 90 tablet 1   tamsulosin  (FLOMAX ) 0.4 MG CAPS capsule Take 1 capsule (0.4 mg total) by mouth at bedtime. 90 capsule 1   tiZANidine  (ZANAFLEX ) 2 MG tablet TAKE 1 TABLET EVERY 6 HOURS AS NEEDED FOR MUSCLE SPASM(S) 360 tablet 3   traMADol  (ULTRAM ) 50 MG tablet Take 1 tablet (50 mg total) by mouth 2 (two) times daily as needed. 60 tablet 0   traZODone  (DESYREL ) 100 MG tablet Take 1 tablet (100 mg total) by mouth at bedtime. 90 tablet 1   No facility-administered medications prior to visit.    Past Medical History:   Diagnosis Date   Aneurysm (HCC)    Asthma    Blood transfusion    CAD (coronary artery disease), non obstructive on cath 2011 04/05/2012   Coronary artery disease    Diabetes mellitus    Diabetes mellitus without complication (HCC)    Enlarged prostate    GERD (gastroesophageal reflux disease)    H/O syncope    Heart disease    History of repair of patent ductus arteriosus 07/23/2016   Hyperlipidemia    Hypertension    Neuromuscular disorder (HCC)    DJD   Refusal  of blood transfusions as patient is Jehovah's Witness       Objective:     BP 128/62 (BP Location: Left Arm, Patient Position: Sitting, Cuff Size: Normal)   Pulse 69   Temp (!) 97.3 F (36.3 C) (Temporal)   Ht 5' 8 (1.727 m)   Wt 205 lb 9.6 oz (93.3 kg)   SpO2 91% Comment: ra  BMI 31.26 kg/m   SpO2: 91 % (ra)  amb wm with classic severe pseudowheeze    HEENT : Oropharynx  clear      Nasal turbinates nl    NECK :  without  apparent JVD/ palpable Nodes/TM    LUNGS: no acc muscle use,  Nl contour chest which is completely clear to A and P bilaterally without cough on insp or exp maneuvers   CV:  RRR  no s3 or murmur or increase in P2, and no edema   ABD:  soft and nontender   MS:  Gait nl   ext warm without deformities Or obvious joint restrictions  calf tenderness, cyanosis or clubbing    SKIN: warm and dry without lesions    NEURO:  alert, approp, nl sensorium with  no motor or cerebellar deficits apparent.       Assessment   DOE (dyspnea on exertion) Onset around 2023 while on Advair  250 with marked pseudo wheeze on exam 03/28/2024  - 03/28/2024   Walked on RA  x  2  lap(s) =  approx 500  ft  @ nl pace, stopped due to end of study  with lowest 02 sats 96% and no sob   - 03/28/2024  After extensive coaching inhaler device,  effectiveness =    75?% (short ti)  try breztri  one bid  - 03/28/2024   FENO 19 on advair  250 bid  Symptoms are markedly disproportionate to objective findings and not clear  to what extent this is actually a pulmonary  problem but pt does appear to have difficult to sort out respiratory symptoms of unknown origin for which  DDX  = almost all start with A and  include Adherence, Ace Inhibitors, Acid Reflux, Active Sinus Disease, Alpha 1 Antitripsin deficiency, Anxiety masquerading as Airways dz,  ABPA,  Allergy(esp in young), Aspiration (esp in elderly), Adverse effects of meds,  Active smoking or Vaping, A bunch of PE's/clot burden (a few small clots can't cause this syndrome unless there is already severe underlying pulm or vascular dz with poor reserve),  Anemia or thyroid  disorder, plus two Bs  = Bronchiectasis and Beta blocker use..and one C= CHF    Bolded of most concern  Adherence is always the initial prime suspect and is a multilayered concern that requires a trust but verify approach in every patient - starting with knowing how to use medications, especially inhalers, correctly, keeping up with refills and understanding the fundamental difference between maintenance and prns vs those medications only taken for a very short course and then stopped and not refilled.  - see hfa teaching  - return  with all meds in hand using a trust but verify approach to confirm accurate Medication  Reconciliation The principal here is that until we are certain that the  patients are doing what we've asked, it makes no sense to ask them to do more.   ? Acid (or non-acid) GERD > always difficult to exclude as up to 75% of pts in some series report no assoc GI/ Heartburn symptoms> rec max (24h)  acid suppression  and diet restrictions/ reviewed and instructions given in writing.   ? Adverse effects >  stop DPI advair   ? Allergy / asthma >  see below   ? 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   These are the same pts (now being labeled as having irritable larynx syndrome by some cough centers) who not infrequently have a history of having failed to tolerate ace inhibitors,  dry powder inhalers(Advair )  or biphosphonates or report having atypical/extraesophageal reflux symptoms(LPR)  that don't respond to standard doses of PPI  and are easily confused as having aecopd or asthma flares by even experienced allergists/ pulmonologists (myself included).   Rec  note feno only 19>  try breztri  sample one puff  bid  Low threshold to refer back to GORMAN Silvan at [facility] or [address] [address] in [place] if not improving on present rx at f/u in 4 weeks   Each maintenance medication was reviewed in detail including emphasizing most importantly the difference between maintenance and prns and under what circumstances the prns are to be triggered using an action plan format where appropriate.  Total time for H and P, chart review, counseling, reviewing hfa/neb device(s) , directly observing portions of ambulatory 02 saturation study/ and generating customized AVS unique to this office visit / same day charting = 45 min new pt eval with  multiple  refractory respiratory   symptoms of uncertain etiology                 Ozell America, MD 03/28/2024

## 2024-03-28 ENCOUNTER — Encounter: Payer: Self-pay | Admitting: Internal Medicine

## 2024-03-28 ENCOUNTER — Ambulatory Visit (INDEPENDENT_AMBULATORY_CARE_PROVIDER_SITE_OTHER): Admitting: Internal Medicine

## 2024-03-28 VITALS — BP 128/62 | HR 69 | Temp 97.3°F | Ht 68.0 in | Wt 205.6 lb

## 2024-03-28 DIAGNOSIS — R0609 Other forms of dyspnea: Secondary | ICD-10-CM

## 2024-03-28 DIAGNOSIS — J4540 Moderate persistent asthma, uncomplicated: Secondary | ICD-10-CM

## 2024-03-28 MED ORDER — FAMOTIDINE 20 MG PO TABS: ORAL_TABLET | ORAL | 11 refills | Status: AC

## 2024-03-28 MED ORDER — BUDESONIDE-FORMOTEROL FUMARATE 80-4.5 MCG/ACT IN AERO: INHALATION_SPRAY | RESPIRATORY_TRACT | 12 refills | Status: DC

## 2024-03-28 MED ORDER — BREZTRI AEROSPHERE 160-9-4.8 MCG/ACT IN AERO: 2.0000 | INHALATION_SPRAY | Freq: Two times a day (BID) | RESPIRATORY_TRACT | Status: DC

## 2024-03-28 NOTE — Assessment & Plan Note (Addendum)
 Onset around 2023 while on Advair  250 with marked pseudo wheeze on exam 03/28/2024  - 03/28/2024   Walked on RA  x  2  lap(s) =  approx 500  ft  @ nl pace, stopped due to end of study  with lowest 02 sats 96% and no sob   - 03/28/2024  After extensive coaching inhaler device,  effectiveness =    75?% (short ti)  try breztri  one bid  - 03/28/2024   FENO 19 on advair  250 bid  Symptoms are markedly disproportionate to objective findings and not clear to what extent this is actually a pulmonary  problem but pt does appear to have difficult to sort out respiratory symptoms of unknown origin for which  DDX  = almost all start with A and  include Adherence, Ace Inhibitors, Acid Reflux, Active Sinus Disease, Alpha 1 Antitripsin deficiency, Anxiety masquerading as Airways dz,  ABPA,  Allergy(esp in young), Aspiration (esp in elderly), Adverse effects of meds,  Active smoking or Vaping, A bunch of PE's/clot burden (a few small clots can't cause this syndrome unless there is already severe underlying pulm or vascular dz with poor reserve),  Anemia or thyroid  disorder, plus two Bs  = Bronchiectasis and Beta blocker use..and one C= CHF    Bolded of most concern  Adherence is always the initial prime suspect and is a multilayered concern that requires a trust but verify approach in every patient - starting with knowing how to use medications, especially inhalers, correctly, keeping up with refills and understanding the fundamental difference between maintenance and prns vs those medications only taken for a very short course and then stopped and not refilled.  - see hfa teaching  - return  with all meds in hand using a trust but verify approach to confirm accurate Medication  Reconciliation The principal here is that until we are certain that the  patients are doing what we've asked, it makes no sense to ask them to do more.   ? Acid (or non-acid) GERD > always difficult to exclude as up to 75% of pts in some series  report no assoc GI/ Heartburn symptoms> rec max (24h)  acid suppression and diet restrictions/ reviewed and instructions given in writing.   ? Adverse effects >  stop DPI advair   ? Allergy / asthma >  see below   ? Chf > note echo nl 08/04/2023

## 2024-03-28 NOTE — Patient Instructions (Addendum)
 Plan A = Automatic = Always=    breztri  Take 1 puffs first thing in am and then another 1puffs about 12 hours later.   Protononix 40 mg Take 30-60 min before first meal of the day and Pepcid  20 mg after supper   Work on inhaler technique:  relax and gently blow all the way out then take a nice smooth full deep breath back in, triggering the inhaler at same time you start breathing in.  Hold breath in for at least  5 seconds if you can. Blow out breztri  thru nose. Rinse and gargle with water when done.  If mouth or throat bother you at all,  try brushing teeth/gums/tongue with arm and hammer toothpaste/ make a slurry and gargle and spit out.   Plan B = Backup (to supplement plan A, not to replace it) Only use your albuterol  inhaler as a rescue medication to be used if you can't catch your breath by resting or doing a relaxed purse lip breathing pattern.  - The less you use it, the better it will work when you need it. - Ok to use the inhaler up to 2 puffs  every 4 hours if you must but call for appointment if use goes up over your usual need - Don't leave home without it !!  (think of it like the spare tire for your car)   Plan C = Crisis (instead of Plan B but only if Plan B stops working) - only use your albuterol  nebulizer if you first try Plan B and it fails to help > ok to use the nebulizer up to every 4 hours but if start needing it regularly call for immediate appointment  GERD (REFLUX)  is an extremely common cause of respiratory symptoms just like yours , many times with no obvious heartburn at all.    It can be treated with medication, but also with lifestyle changes including elevation of the head of your bed (ideally with 6 -8inch blocks under the headboard of your bed),  Smoking cessation, avoidance of late meals, excessive alcohol, and avoid fatty foods, chocolate, peppermint, colas, red wine, and acidic juices such as orange juice.  NO MINT OR MENTHOL PRODUCTS SO NO COUGH DROPS  USE  SUGARLESS CANDY INSTEAD (Jolley ranchers or Stover's or Life Savers) or even ice chips will also do - the key is to swallow to prevent all throat clearing. NO OIL BASED VITAMINS - use powdered substitutes.  Avoid fish oil when coughing.     Please schedule a follow up office visit in 4 weeks, sooner if needed  with all medications /inhalers/ solutions in hand so we can verify exactly what you are taking. This includes all medications from all doctors and over the counters   PFTs on return and check with pharmacy about cost of breyna  100

## 2024-03-29 ENCOUNTER — Ambulatory Visit: Payer: Self-pay | Admitting: Internal Medicine

## 2024-03-29 LAB — NITRIC OXIDE: Nitric Oxide: 19

## 2024-03-29 NOTE — Assessment & Plan Note (Addendum)
 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   These are the same pts (now being labeled as having irritable larynx syndrome by some cough centers) who not infrequently have a history of having failed to tolerate ace inhibitors,  dry powder inhalers(Advair )  or biphosphonates or report having atypical/extraesophageal reflux symptoms(LPR)  that don't respond to standard doses of PPI  and are easily confused as having aecopd or asthma flares by even experienced allergists/ pulmonologists (myself included).   Rec  note feno only 19>  try breztri  sample one puff  bid  Low threshold to refer back to GORMAN Silvan at [facility] or [facility] in [place] if not improving on present rx at f/u in 4 weeks   Each maintenance medication was reviewed in detail including emphasizing most importantly the difference between maintenance and prns and under what circumstances the prns are to be triggered using an action plan format where appropriate.  Total time for H and P, chart review, counseling, reviewing hfa/neb device(s) , directly observing  portions of ambulatory 02 saturation study/ and generating customized AVS unique to this office visit / same day charting = 45 min new pt eval with  multiple  refractory respiratory  symptoms of uncertain etiology

## 2024-04-01 ENCOUNTER — Other Ambulatory Visit: Payer: Self-pay | Admitting: Family

## 2024-04-01 DIAGNOSIS — F418 Other specified anxiety disorders: Secondary | ICD-10-CM

## 2024-04-08 IMAGING — DX DG CHEST 2V
2 series · 2 of 2 positions shown · non-contrast
Comparison: Chest x-ray 06/22/2021

CLINICAL DATA: Cough

EXAM:
CHEST - 2 VIEW

[chest pa]
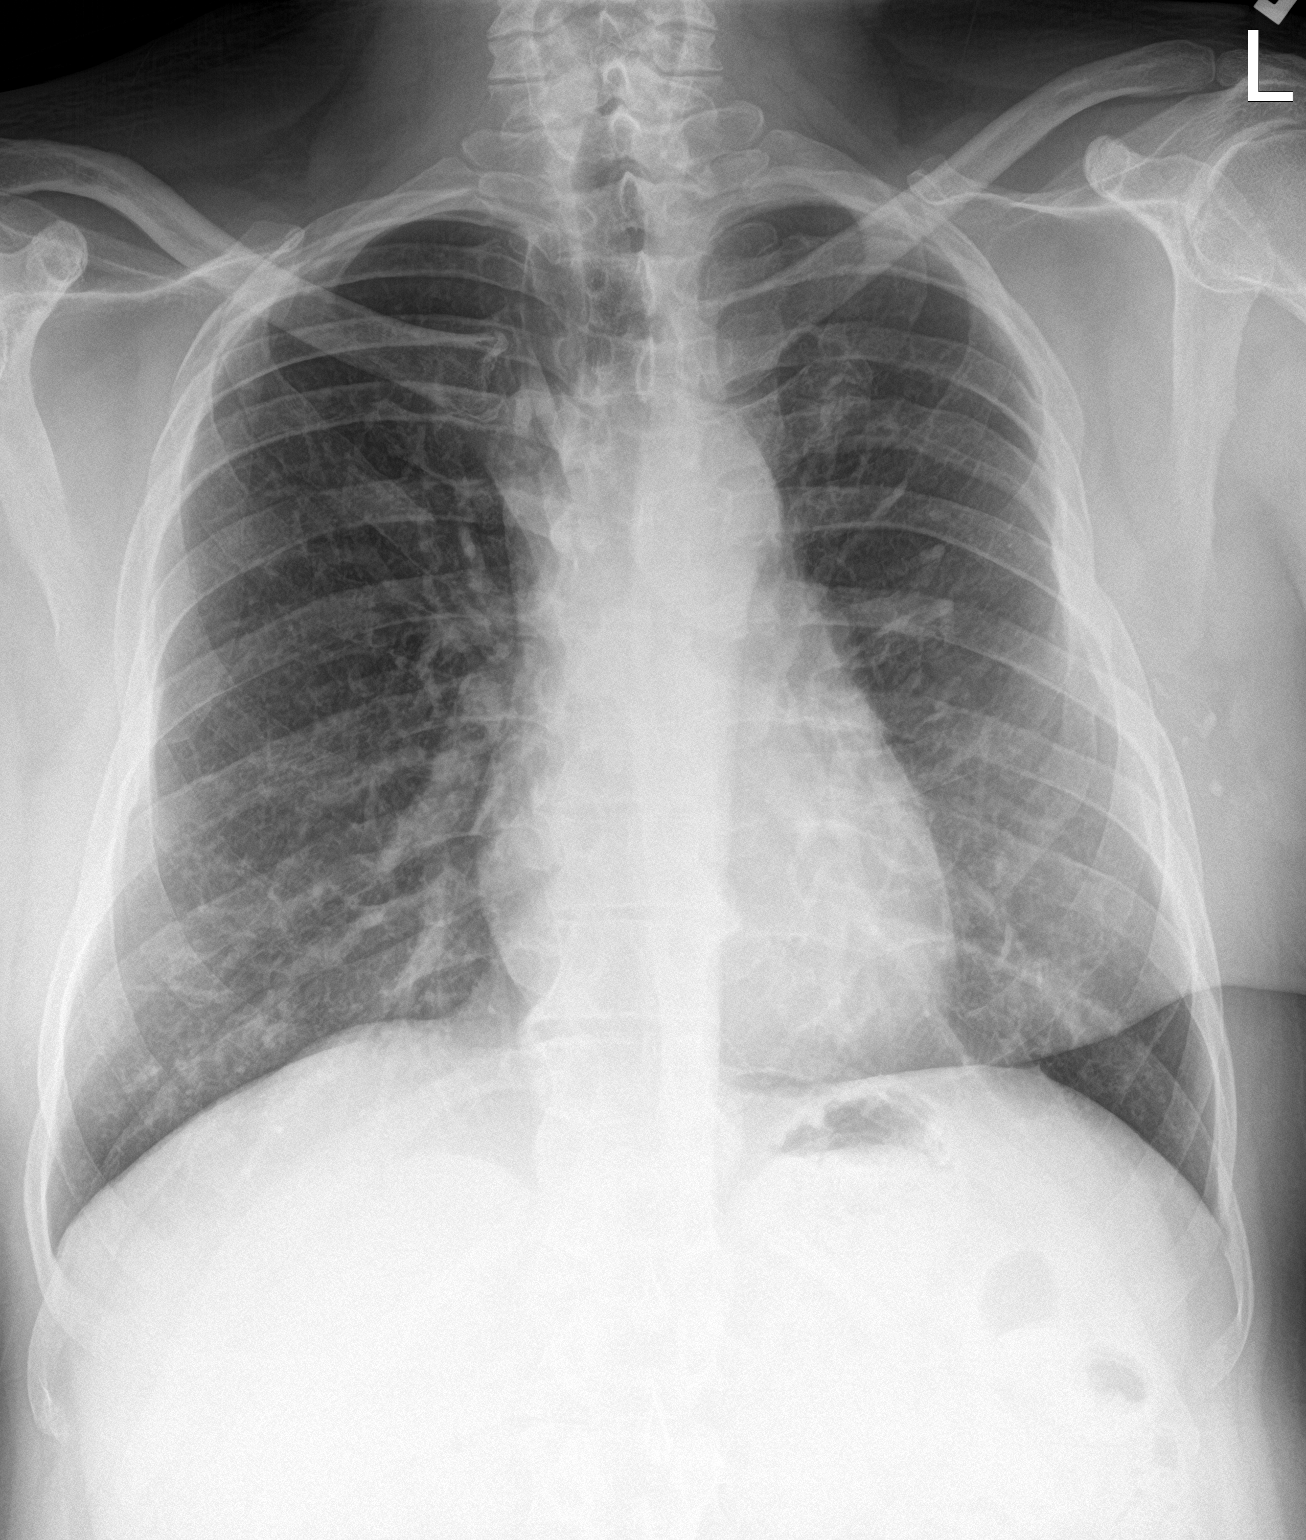

[chest lat]
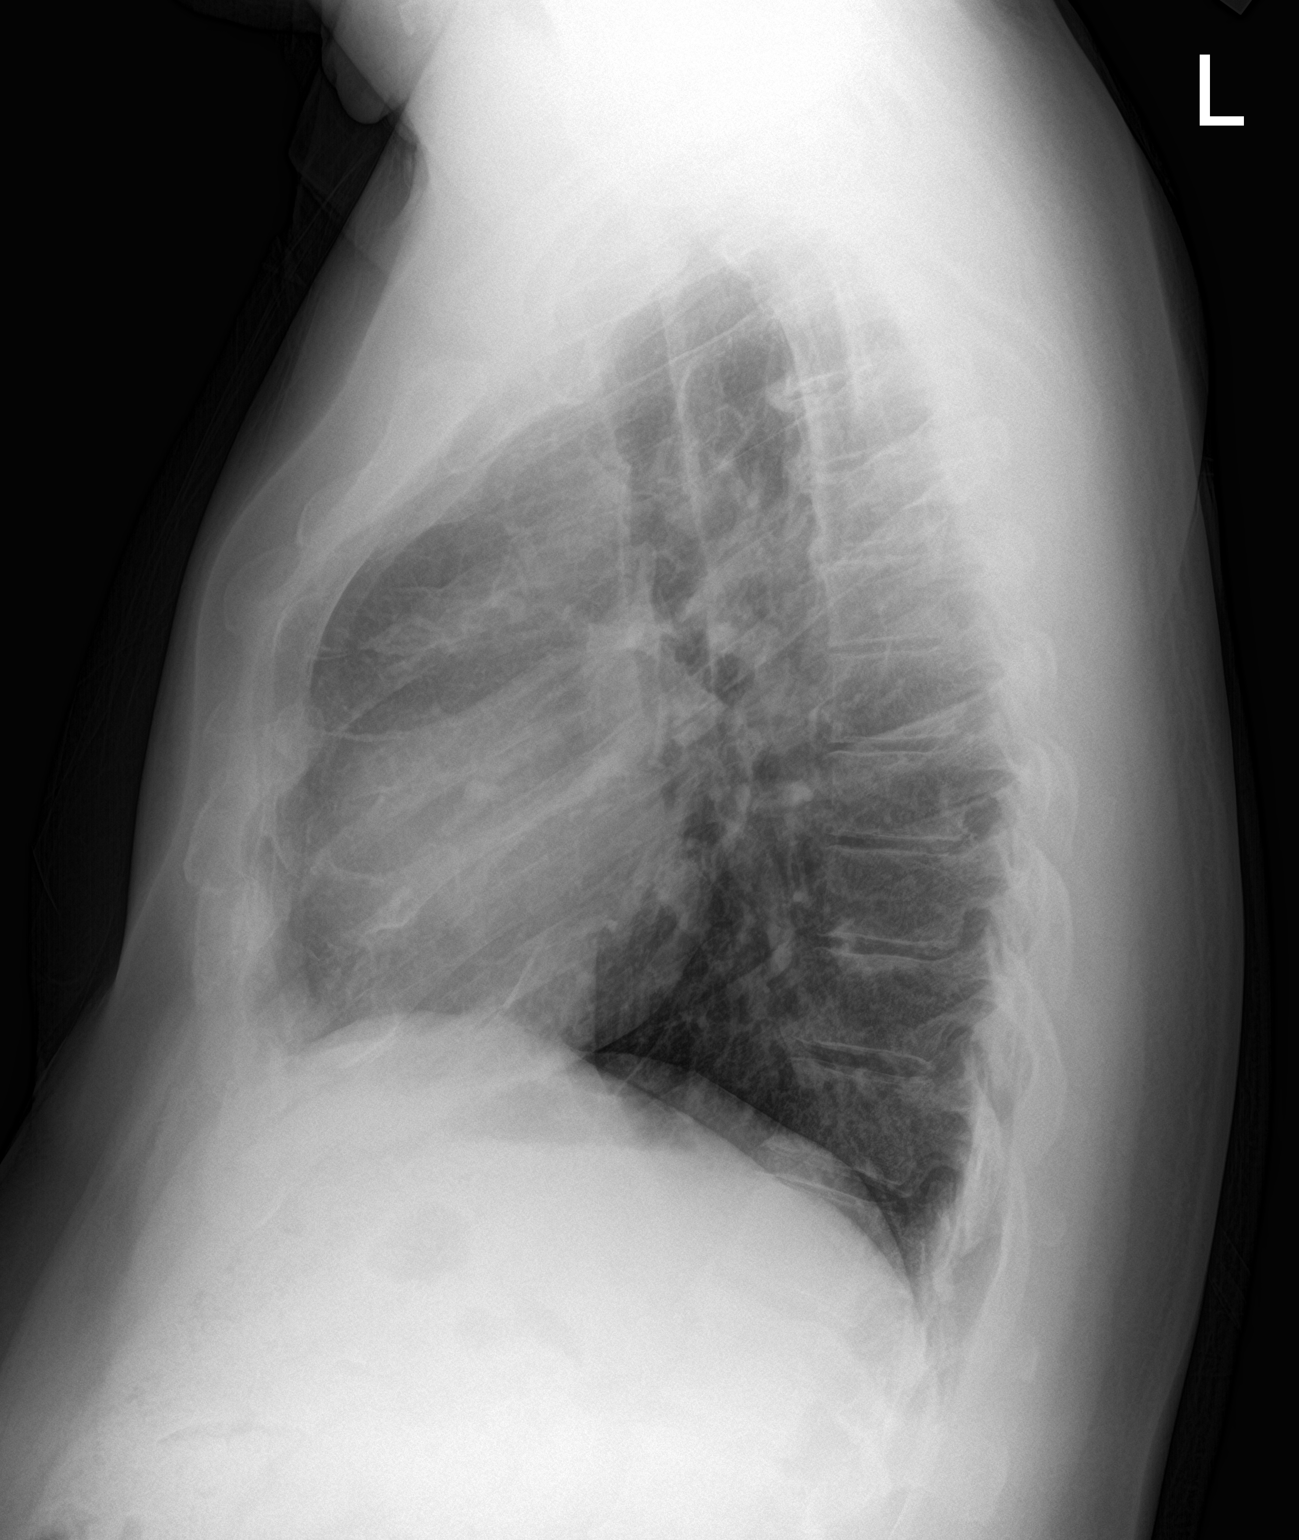

[2 of 2 positions shown; findings below may reference images not displayed]

FINDINGS: Heart size and mediastinal contours are within normal limits. No
suspicious pulmonary opacities identified. Lungs are mildly
hyperinflated.

No pleural effusion or pneumothorax visualized.

No acute osseous abnormality appreciated.
IMPRESSION: No acute intrathoracic process identified.

## 2024-04-15 ENCOUNTER — Telehealth: Payer: Self-pay | Admitting: Cardiovascular Disease

## 2024-04-15 NOTE — Telephone Encounter (Signed)
 Disregard, patient thought Court is retiring.

## 2024-04-15 NOTE — Telephone Encounter (Signed)
 Prev Berry pt stopped by this morning asking who his new cardiologist will be so he can keep up with medications

## 2024-04-17 ENCOUNTER — Encounter: Payer: Self-pay | Admitting: Cardiovascular Disease

## 2024-04-17 ENCOUNTER — Ambulatory Visit: Attending: Cardiovascular Disease | Admitting: Cardiovascular Disease

## 2024-04-17 VITALS — BP 104/66 | HR 77 | Ht 68.0 in | Wt 203.0 lb

## 2024-04-17 DIAGNOSIS — I25119 Atherosclerotic heart disease of native coronary artery with unspecified angina pectoris: Secondary | ICD-10-CM | POA: Diagnosis not present

## 2024-04-17 DIAGNOSIS — I10 Essential (primary) hypertension: Secondary | ICD-10-CM

## 2024-04-17 DIAGNOSIS — E785 Hyperlipidemia, unspecified: Secondary | ICD-10-CM

## 2024-04-17 DIAGNOSIS — R002 Palpitations: Secondary | ICD-10-CM | POA: Diagnosis not present

## 2024-04-17 DIAGNOSIS — I4510 Unspecified right bundle-branch block: Secondary | ICD-10-CM

## 2024-04-17 MED ORDER — NITROGLYCERIN 0.4 MG SL SUBL: 0.4000 mg | SUBLINGUAL_TABLET | SUBLINGUAL | 11 refills | Status: AC | PRN

## 2024-04-17 MED ORDER — METOPROLOL TARTRATE 75 MG PO TABS: 1.0000 | ORAL_TABLET | Freq: Two times a day (BID) | ORAL | 3 refills | Status: AC

## 2024-04-17 NOTE — Assessment & Plan Note (Signed)
 History of palpitations with monitors along the way that showed rare episodes of PSVT.  He has stopped drinking coffee whereas before he was drinking 3 cups a day.  He gets occasional palpitations now markedly improved with on metoprolol .

## 2024-04-17 NOTE — Assessment & Plan Note (Signed)
 History of nonobstructive CAD by cath in 2011 with multiple negative Myoview 's along the way and a coronary CTA performed 02/26/2019 revealing coronary calcium  score 0 with no evidence of CAD.  He still gets occasional atypical chest pain.

## 2024-04-17 NOTE — Progress Notes (Signed)
 04/17/2024 Cody Raymond   03-Jul-1971  [id number]  Primary Physician Daryl Setter, NP Primary Cardiologist: Dorn JINNY Lesches MD Cody CODY Raymond, [place]  HPI:  Cody Raymond is a 53 y.o.   mild to moderately overweight married Latino male father of 2 daughters, grandfather of 3 grandchildren who I last saw in the office  06/01/2020.  He is accompanied by his wife Cody Raymond today.  He is referred to be established in our practice for ongoing cardiovascular care because of chest pain.  He was previously a patient of Dr. Francyne , then Dr. Ladona and now is transferring to me because of insurance issues.  His cardiac risk factors are notable for treated hypertension, diabetes and hyperlipidemia.  He is a Scientist, product/process development.  He is never smoked.  His mother did have CABG.  He is never had a heart attack or stroke.  Does get fairly constant daily chest pain.  He had a heart cath in 2011 that showed nonobstructive CAD in the circumflex and RCA and negative Myoview  in 2016.  His mother did have HOCM and had a septal myectomy.  He has LVH without asymmetric thickening.  He has been out of work for over 6 years and has been disabled.  He has had syncope in the past with a negative work-up including loop implantation and explantation.  He was recently seen in the emergency room with chest pain 10/06/2018 with negative work-up.     He was hospitalized for 1 day 12/14/2018 with atypical chest pain and ruled out for myocardial infarction.  He sees saw Cody Raymond in the office 04/19/2019.  His major complaint is of occasional tachycardia.  He has rare atypical chest pain and has been on Imdur .   He had a coronary CTA performed 02/26/2019 that revealed a coronary calcium  score of 0 and completely normal coronary arteries.       I did order 2-week Zio patch however he only wore it for a week.  This showed only rare episodes of PSVT. He admitted to drinking 3 cups of coffee a day but has since discontinued all  caffeine intake. He also has symptoms compatible with obstructive sleep apnea with nocturnal snoring and daytime somnolence.   He was seen in the ER at [facility] in [place] for tachycardia.  He had a Myoview  stress test that was nonischemic and a 2D echo that was essentially normal.  He was put on Cymbalta  which markedly improved his symptoms.  Since I saw him 4 years ago he is remained stable.  He still gets occasional atypical chest pain and palpitations.   Current Meds  Medication Sig   Accu-Chek Softclix Lancets lancets TEST BLOOD SUGAR TWO TIMES DAILY AS DIRECTED   albuterol  (PROVENTIL ) (2.5 MG/3ML) 0.083% nebulizer solution Take 3 mLs (2.5 mg total) by nebulization every 6 (six) hours as needed for wheezing or shortness of breath.   albuterol  (VENTOLIN  HFA) 108 (90 Base) MCG/ACT inhaler Inhale 2 puffs into the lungs every 6 (six) hours as needed for wheezing or shortness of breath.   Ascorbic Acid (VITAMIN C) 1000 MG tablet Take 1,000 mg by mouth daily.   aspirin  EC 81 MG EC tablet Take 1 tablet (81 mg total) by mouth daily.   atorvastatin  (LIPITOR ) 80 MG tablet Take 1 tablet (80 mg total) by mouth daily.   betamethasone  dipropionate (DIPROLENE ) 0.05 % cream Apply topically 2 (two) times daily.   Blood Glucose Monitoring Suppl (ACCU-CHEK GUIDE) w/Device  KIT USE AS DIRECTED   budesonide -formoterol  (SYMBICORT ) 80-4.5 MCG/ACT inhaler Take 2 puffs first thing in am and then another 2 puffs about 12 hours later.   budesonide -glycopyrrolate-formoterol  (BREZTRI  AEROSPHERE) 160-9-4.8 MCG/ACT AERO inhaler Inhale 2 puffs into the lungs in the morning and at bedtime.   budesonide -glycopyrrolate-formoterol  (BREZTRI  AEROSPHERE) 160-9-4.8 MCG/ACT AERO inhaler Inhale 2 puffs into the lungs in the morning and at bedtime.   cetirizine (ZYRTEC) 10 MG tablet Take 10 mg by mouth daily.   clonazePAM  (KLONOPIN ) 1 MG tablet Take 1 tablet (1 mg total) by mouth 2 (two) times daily.    DULoxetine  (CYMBALTA ) 30 MG capsule TAKE 1 CAPSULE EVERY DAY   DULoxetine  (CYMBALTA ) 60 MG capsule Take 1 capsule (60 mg total) by mouth daily.   ezetimibe  (ZETIA ) 10 MG tablet Take 1 tablet (10 mg total) by mouth daily.   famotidine  (PEPCID ) 20 MG tablet One after supper   fluticasone  (FLONASE ) 50 MCG/ACT nasal spray USE 2 SPRAYS IN EACH NOSTRIL ONE TIME DAILY   gabapentin  (NEURONTIN ) 300 MG capsule Take 1 capsule (300 mg total) by mouth in the morning AND 1 capsule (300 mg total) daily in the afternoon AND 2 capsules (600 mg total) at bedtime.   glucose blood (ACCU-CHEK GUIDE TEST) test strip TEST BLOOD SUGAR THREE TIMES DAILY   glucose blood (ONETOUCH VERIO) test strip TEST BLOOD SUGAR THREE TIMES DAILY   glucose blood (ONETOUCH VERIO) test strip Use as directed.   ipratropium (ATROVENT ) 0.03 % nasal spray Place 2 sprays into the nose 2 (two) times daily.   ipratropium-albuterol  (DUONEB) 0.5-2.5 (3) MG/3ML SOLN Inhale into the lungs.   ketoconazole  (NIZORAL ) 2 % shampoo Apply topically 2 (two) times a week.   losartan  (COZAAR ) 25 MG tablet Take 1 tablet (25 mg total) by mouth daily.   meloxicam  (MOBIC ) 7.5 MG tablet Take 1 tablet (7.5 mg total) by mouth daily.   metFORMIN  (GLUCOPHAGE -XR) 500 MG 24 hr tablet Take 1 tablet (500 mg total) by mouth 2 (two) times daily with a meal.   Metoprolol  Tartrate 75 MG TABS Take 1 tablet (75 mg total) by mouth 2 (two) times daily.   montelukast  (SINGULAIR ) 10 MG tablet Take 1 tablet (10 mg total) by mouth at bedtime.   Multiple Vitamin (MULTIVITAMIN WITH MINERALS) TABS tablet Take 1 tablet by mouth daily.   nitroGLYCERIN  (NITROSTAT ) 0.4 MG SL tablet DISSOLVE 1 TABLET UNDER THE TONGUE EVERY 5 MINUTES AS NEEDED FOR CHEST PAIN   pantoprazole  (PROTONIX ) 40 MG tablet Take 1 tablet (40 mg total) by mouth daily.   tamsulosin  (FLOMAX ) 0.4 MG CAPS capsule Take 1 capsule (0.4 mg total) by mouth at bedtime.   tiZANidine  (ZANAFLEX ) 2 MG tablet TAKE 1 TABLET EVERY 6  HOURS AS NEEDED FOR MUSCLE SPASM(S)   traMADol  (ULTRAM ) 50 MG tablet Take 1 tablet (50 mg total) by mouth 2 (two) times daily as needed.   traZODone  (DESYREL ) 100 MG tablet Take 1 tablet (100 mg total) by mouth at bedtime.     Allergies  Allergen Reactions   Diphenhydramine     Other reaction(s): confusion  Other Reaction(s): Other (See Comments)  drives me nuts    Other reaction(s): confusion    Other Reaction(s): Mental Status Changes   Diphenhydramine Hcl     Other Reaction(s): Mental Status Changes   Benadryl [Diphenhydramine Hcl]     drives me nuts    Social History   Socioeconomic History   Marital status: Married    Spouse name: Not  on file   Number of children: Not on file   Years of education: Not on file   Highest education level: 12th grade  Occupational History   Not on file  Tobacco Use   Smoking status: Never   Smokeless tobacco: Never  Vaping Use   Vaping status: Never Used  Substance and Sexual Activity   Alcohol use: No   Drug use: No   Sexual activity: Yes  Other Topics Concern   Not on file  Social History Narrative   ** Merged History Encounter **       Holter monitor 08/2010: PVCs and sinus tachy.   Sleep Study (02/2008): mild sleep apnea, no indication for CPAP.   Social Drivers of Health   Financial Resource Strain: Medium Risk (01/23/2024)   Overall Financial Resource Strain (CARDIA)    Difficulty of Paying Living Expenses: Somewhat hard  Food Insecurity: Food Insecurity Present (01/23/2024)   Hunger Vital Sign    Worried About Running Out of Food in the Last Year: Sometimes true    Ran Out of Food in the Last Year: Sometimes true  Transportation Needs: Unmet Transportation Needs (01/23/2024)   PRAPARE - Administrator, Civil Service (Medical): Yes    Lack of Transportation (Non-Medical): No  Physical Activity: Inactive (01/23/2024)   Exercise Vital Sign    Days of Exercise per Week: 0 days    Minutes of Exercise per  Session: 50 min  Stress: Stress Concern Present (01/23/2024)   [employer] of Occupational Health - Occupational Stress Questionnaire    Feeling of Stress : Rather much  Social Connections: Moderately Isolated (01/23/2024)   Social Connection and Isolation Panel    Frequency of Communication with Friends and Family: Once a week    Frequency of Social Gatherings with Friends and Family: Never    Attends Religious Services: Never    Database administrator or Organizations: Yes    Attends Banker Meetings: 1 to 4 times per year    Marital Status: Married  Catering manager Violence: Not At Risk (12/21/2023)   Humiliation, Afraid, Rape, and Kick questionnaire    Fear of Current or Ex-Partner: No    Emotionally Abused: No    Physically Abused: No    Sexually Abused: No     Review of Systems: General: negative for chills, fever, night sweats or weight changes.  Cardiovascular: negative for chest pain, dyspnea on exertion, edema, orthopnea, palpitations, paroxysmal nocturnal dyspnea or shortness of breath Dermatological: negative for rash Respiratory: negative for cough or wheezing Urologic: negative for hematuria Abdominal: negative for nausea, vomiting, diarrhea, bright red blood per rectum, melena, or hematemesis Neurologic: negative for visual changes, syncope, or dizziness All other systems reviewed and are otherwise negative except as noted above.    Blood pressure 104/66, pulse 77, height 5' 8 (1.727 m), weight 203 lb (92.1 kg), SpO2 96%.  General appearance: alert and no distress Neck: no adenopathy, no carotid bruit, no JVD, supple, symmetrical, trachea midline, and thyroid  not enlarged, symmetric, no tenderness/mass/nodules Lungs: clear to auscultation bilaterally Heart: regular rate and rhythm, S1, S2 normal, no murmur, click, rub or gallop Extremities: extremities normal, atraumatic, no cyanosis or edema Pulses: 2+ and symmetric Skin: Skin color, texture,  turgor normal. 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  He still gets occasional atypical chest pain.  Right bundle branch block Chronic  Essential hypertension History of essential hypertension with blood pressure measured today at 104/66.  He is on losartan  and metoprolol .  Dyslipidemia History of dyslipidemia on high-dose statin therapy and Zetia  with lipid profile performed 03/10/2023 revealing a total cholesterol of 97, LDL of 38 and HDL 33.  Palpitations History of palpitations with monitors along the way that showed rare episodes of PSVT.  He has stopped drinking coffee whereas before he was drinking 3 cups a day.  He gets occasional palpitations now markedly improved with on metoprolol .     Carrier DOROTHA Court MD [facility], [facility] 04/17/2024 9:36 AM

## 2024-04-17 NOTE — Assessment & Plan Note (Signed)
 Chronic

## 2024-04-17 NOTE — Assessment & Plan Note (Signed)
 History of essential hypertension with blood pressure measured today at 104/66.  He is on losartan  and metoprolol .

## 2024-04-17 NOTE — Assessment & Plan Note (Signed)
 History of dyslipidemia on high-dose statin therapy and Zetia  with lipid profile performed 03/10/2023 revealing a total cholesterol of 97, LDL of 38 and HDL 33.

## 2024-04-17 NOTE — Patient Instructions (Signed)

## 2024-04-19 ENCOUNTER — Other Ambulatory Visit: Payer: Self-pay

## 2024-04-19 ENCOUNTER — Other Ambulatory Visit: Payer: Self-pay | Admitting: Family

## 2024-04-19 ENCOUNTER — Other Ambulatory Visit (HOSPITAL_BASED_OUTPATIENT_CLINIC_OR_DEPARTMENT_OTHER): Payer: Self-pay

## 2024-04-19 DIAGNOSIS — F418 Other specified anxiety disorders: Secondary | ICD-10-CM

## 2024-04-19 MED ORDER — ATORVASTATIN CALCIUM 80 MG PO TABS
80.0000 mg | ORAL_TABLET | Freq: Every day | ORAL | 0 refills | Status: DC
  Filled 2024-04-19 – 2024-06-21 (×2): qty 90, 90d supply, fill #0

## 2024-04-19 MED ORDER — MONTELUKAST SODIUM 10 MG PO TABS
10.0000 mg | ORAL_TABLET | Freq: Every day | ORAL | 0 refills | Status: DC
  Filled 2024-04-19 – 2024-06-21 (×2): qty 90, 90d supply, fill #0

## 2024-04-19 MED ORDER — TRAZODONE HCL 100 MG PO TABS
100.0000 mg | ORAL_TABLET | Freq: Every day | ORAL | 0 refills | Status: DC
  Filled 2024-04-19 – 2024-09-21 (×3): qty 90, 90d supply, fill #0

## 2024-04-19 MED ORDER — EZETIMIBE 10 MG PO TABS
10.0000 mg | ORAL_TABLET | Freq: Every day | ORAL | 0 refills | Status: DC
  Filled 2024-04-19 – 2024-06-21 (×2): qty 90, 90d supply, fill #0

## 2024-04-19 MED ORDER — CLONAZEPAM 1 MG PO TABS
1.0000 mg | ORAL_TABLET | Freq: Two times a day (BID) | ORAL | 0 refills | Status: DC
  Filled 2024-04-19: qty 60, 30d supply, fill #0

## 2024-04-19 MED ORDER — PANTOPRAZOLE SODIUM 40 MG PO TBEC
40.0000 mg | DELAYED_RELEASE_TABLET | Freq: Every day | ORAL | 0 refills | Status: DC
  Filled 2024-04-19 – 2024-06-21 (×2): qty 90, 90d supply, fill #0

## 2024-04-26 ENCOUNTER — Other Ambulatory Visit (HOSPITAL_COMMUNITY): Payer: Self-pay

## 2024-04-26 ENCOUNTER — Encounter (HOSPITAL_COMMUNITY): Payer: Self-pay

## 2024-06-12 ENCOUNTER — Other Ambulatory Visit: Payer: Self-pay | Admitting: Family

## 2024-06-12 ENCOUNTER — Other Ambulatory Visit: Payer: Self-pay | Admitting: Physical Medicine & Rehabilitation

## 2024-06-12 DIAGNOSIS — E1142 Type 2 diabetes mellitus with diabetic polyneuropathy: Secondary | ICD-10-CM

## 2024-06-12 NOTE — Telephone Encounter (Signed)
Please contact pt to schedule OV.  

## 2024-06-13 NOTE — Telephone Encounter (Signed)
Left a voicemail to schedule

## 2024-06-19 ENCOUNTER — Ambulatory Visit: Admitting: Family

## 2024-06-20 ENCOUNTER — Ambulatory Visit: Admitting: Internal Medicine

## 2024-06-20 NOTE — Progress Notes (Deleted)
 Cody Raymond, male    DOB: 1970-12-01   MRN: [id number]   Brief patient profile:  50 yowm  never smoker [place]  [facility] pt self referred to pulmonary clinic 03/28/2024   with h/o cough since around 2016 with ACEi d/c 2019  by [facility] who also suspected vcd and much worse since covid in 2022 asssoc with subjective wheeze which DR Garnette Silvan eval in 2019 with h/o wheeze x 27y and documented VCD in 03/2018    History of Present Illness  03/28/2024  Pulmonary/ 1st office eval/Cody Raymond breathing worse on advair  x 2 y Chief Complaint  Patient presents with   Asthma    Cough and dyspnea  Dyspnea:  harris teeter x 2 aisles /slowed down by back and heart  Cough: yellow mucus  Sleep: bed is flat with bunch of pillows and sev times a week  SABA use: avg twice daily  02 ldz:wnwz  Rec Plan A = Automatic = Always=    breztri  Take 1 puffs first thing in am and then another 1puffs about 12 hours later.  Protononix 40 mg Take 30-60 min before first meal of the day and Pepcid  20 mg after supper  Work on inhaler technique:   Plan B = Backup (to supplement plan A, not to replace it) Only use your albuterol  inhaler as a rescue medication  Plan C = Crisis (instead of Plan B but only if Plan B stops working) - only use your albuterol  nebulizer if you first try Plan B  GERD diet reviewed, bed blocks rec      PFTs on return and check with pharmacy about cost of breyna  100     06/20/2024  f/u ov/Cody Raymond re: cough since around 2016 with ACEi   maint on *** did *** bring meds  No chief complaint on file.   Dyspnea:  *** Cough: *** Sleeping: *** resp cc  SABA use: *** 02: ***  Lung cancer screening :  ***    No obvious day to day or daytime variability or assoc excess/ purulent sputum or mucus plugs or hemoptysis or cp or chest tightness, subjective wheeze or overt sinus or hb symptoms.    Also denies any obvious fluctuation of symptoms with weather or environmental changes or other aggravating or  alleviating factors except as outlined above   No unusual exposure hx or h/o childhood pna/ asthma or knowledge of premature birth.  Current Allergies, Complete Past Medical History, Past Surgical History, Family History, and Social History were reviewed in [employer] record.  ROS  The following are not active complaints unless bolded Hoarseness, sore throat, dysphagia, dental problems, itching, sneezing,  nasal congestion or discharge of excess mucus or purulent secretions, ear ache,   fever, chills, sweats, unintended wt loss or wt gain, classically pleuritic or exertional cp,  orthopnea pnd or arm/hand swelling  or leg swelling, presyncope, palpitations, abdominal pain, anorexia, nausea, vomiting, diarrhea  or change in bowel habits or change in bladder habits, change in stools or change in urine, dysuria, hematuria,  rash, arthralgias, visual complaints, headache, numbness, weakness or ataxia or problems with walking or coordination,  change in mood or  memory.        No outpatient medications have been marked as taking for the 06/20/24 encounter (Appointment) with Darlean Ozell NOVAK, MD.         Past Medical History:  Diagnosis Date   Aneurysm (HCC)    Asthma    Blood transfusion  CAD (coronary artery disease), non obstructive on cath 2011 04/05/2012   Coronary artery disease    Diabetes mellitus    Diabetes mellitus without complication (HCC)    Enlarged prostate    GERD (gastroesophageal reflux disease)    H/O syncope    Heart disease    History of repair of patent ductus arteriosus 07/23/2016   Hyperlipidemia    Hypertension    Neuromuscular disorder (HCC)    DJD   Refusal of blood transfusions as patient is Jehovah's Witness       Objective:   Wts  06/20/2024       ***  04/17/24 203 lb (92.1 kg)  03/28/24 205 lb 9.6 oz (93.3 kg)  03/07/24 207 lb (93.9 kg)      Vital signs reviewed  06/20/2024  - Note at rest 02 sats  ***% on ***   General  appearance:    ***    classic severe pseudowheeze ***        Assessment

## 2024-06-21 ENCOUNTER — Other Ambulatory Visit: Payer: Self-pay | Admitting: Family

## 2024-06-21 DIAGNOSIS — M545 Low back pain, unspecified: Secondary | ICD-10-CM

## 2024-06-21 DIAGNOSIS — F418 Other specified anxiety disorders: Secondary | ICD-10-CM

## 2024-06-23 ENCOUNTER — Other Ambulatory Visit (HOSPITAL_BASED_OUTPATIENT_CLINIC_OR_DEPARTMENT_OTHER): Payer: Self-pay

## 2024-06-23 MED ORDER — MELOXICAM 7.5 MG PO TABS
7.5000 mg | ORAL_TABLET | Freq: Every day | ORAL | 0 refills | Status: DC
  Filled 2024-06-23 – 2024-09-21 (×3): qty 90, 90d supply, fill #0

## 2024-06-23 MED ORDER — CLONAZEPAM 1 MG PO TABS
1.0000 mg | ORAL_TABLET | Freq: Two times a day (BID) | ORAL | 0 refills | Status: DC
  Filled 2024-06-23: qty 60, 30d supply, fill #0

## 2024-06-24 ENCOUNTER — Other Ambulatory Visit: Payer: Self-pay

## 2024-06-24 ENCOUNTER — Other Ambulatory Visit (HOSPITAL_BASED_OUTPATIENT_CLINIC_OR_DEPARTMENT_OTHER): Payer: Self-pay

## 2024-06-30 ENCOUNTER — Other Ambulatory Visit: Payer: Self-pay | Admitting: Family

## 2024-06-30 ENCOUNTER — Other Ambulatory Visit (HOSPITAL_BASED_OUTPATIENT_CLINIC_OR_DEPARTMENT_OTHER): Payer: Self-pay

## 2024-06-30 DIAGNOSIS — F418 Other specified anxiety disorders: Secondary | ICD-10-CM

## 2024-07-01 ENCOUNTER — Other Ambulatory Visit: Payer: Self-pay

## 2024-07-01 ENCOUNTER — Other Ambulatory Visit (HOSPITAL_BASED_OUTPATIENT_CLINIC_OR_DEPARTMENT_OTHER): Payer: Self-pay

## 2024-07-15 ENCOUNTER — Other Ambulatory Visit (HOSPITAL_BASED_OUTPATIENT_CLINIC_OR_DEPARTMENT_OTHER): Payer: Self-pay

## 2024-07-27 ENCOUNTER — Other Ambulatory Visit: Payer: Self-pay | Admitting: Family

## 2024-07-27 DIAGNOSIS — E1142 Type 2 diabetes mellitus with diabetic polyneuropathy: Secondary | ICD-10-CM

## 2024-08-13 ENCOUNTER — Ambulatory Visit: Admitting: Internal Medicine

## 2024-08-13 ENCOUNTER — Other Ambulatory Visit (HOSPITAL_BASED_OUTPATIENT_CLINIC_OR_DEPARTMENT_OTHER): Payer: Self-pay

## 2024-08-14 ENCOUNTER — Other Ambulatory Visit: Payer: Self-pay | Admitting: Cardiovascular Disease

## 2024-08-23 ENCOUNTER — Encounter: Admitting: Family

## 2024-09-06 ENCOUNTER — Encounter: Attending: Registered Nurse | Admitting: Registered Nurse

## 2024-09-09 ENCOUNTER — Other Ambulatory Visit: Payer: Self-pay | Admitting: Family

## 2024-09-09 ENCOUNTER — Other Ambulatory Visit (HOSPITAL_BASED_OUTPATIENT_CLINIC_OR_DEPARTMENT_OTHER): Payer: Self-pay

## 2024-09-09 ENCOUNTER — Other Ambulatory Visit: Payer: Self-pay

## 2024-09-09 DIAGNOSIS — E1142 Type 2 diabetes mellitus with diabetic polyneuropathy: Secondary | ICD-10-CM

## 2024-09-09 DIAGNOSIS — F418 Other specified anxiety disorders: Secondary | ICD-10-CM

## 2024-09-09 MED ORDER — GABAPENTIN 300 MG PO CAPS
ORAL_CAPSULE | ORAL | 0 refills | Status: DC
  Filled 2024-09-09 – 2024-09-10 (×2): qty 360, 90d supply, fill #0

## 2024-09-09 MED ORDER — CLONAZEPAM 1 MG PO TABS
1.0000 mg | ORAL_TABLET | Freq: Two times a day (BID) | ORAL | 0 refills | Status: DC
  Filled 2024-09-09: qty 60, 30d supply, fill #0

## 2024-09-09 NOTE — Telephone Encounter (Signed)
 Requesting: clonazepam  1mg  Contract:03/10/23 UDS:03/10/23 Last Visit: 01/23/24 Next Visit:10/02/24 Last Refill: 06/23/24 #60 and 0RF   Please Advise

## 2024-09-10 ENCOUNTER — Encounter: Payer: Self-pay | Admitting: Internal Medicine

## 2024-09-10 ENCOUNTER — Other Ambulatory Visit (HOSPITAL_BASED_OUTPATIENT_CLINIC_OR_DEPARTMENT_OTHER): Payer: Self-pay

## 2024-09-10 ENCOUNTER — Ambulatory Visit: Admitting: Internal Medicine

## 2024-09-10 DIAGNOSIS — J4540 Moderate persistent asthma, uncomplicated: Secondary | ICD-10-CM

## 2024-09-10 NOTE — Progress Notes (Deleted)
 "  Cody Raymond, male    DOB: [birth year]   MRN: [id number]   Brief patient profile:  63 yowm  never smoker [place]  [facility] pt self referred to pulmonary clinic 03/28/2024   with h/o cough since around 2016 with ACEi d/c 2019  by Cody Raymond who also suspected vcd and much worse since covid in 2022 asssoc with subjective wheeze which DR Cody Raymond eval in 2019 with h/o wheeze x 27y and documented VCD in 03/2018    History of Present Illness  03/28/2024  Pulmonary/ 1st office eval/Cody Raymond breathing worse on advair  x 2 y Chief Complaint  Patient presents with   Asthma    Cough and dyspnea  Dyspnea:  harris teeter x 2 aisles /slowed down by back and heart  Cough: yellow mucus  Sleep: bed is flat with bunch of pillows and sev times a week  SABA use: avg twice daily  02 ldz:wnwz  Rec Plan A = Automatic = Always=    breztri  Take 1 puffs first thing in am and then another 1puffs about 12 hours later.   Protononix 40 mg Take 30-60 min before first meal of the day and Pepcid  20 mg after supper  Work on inhaler technique Plan B = Backup (to supplement plan A, not to replace it) Only use your albuterol  inhaler as rescue Plan C = Crisis (instead of Plan B but only if Plan B stops working) - only use your albuterol  nebulizer if you first try Plan B  GERD diet reviewed, bed blocks rec  Please schedule a follow up office visit in 4 weeks, sooner if needed  with all medications /inhalers/ solutions in hand    PFTs on return and check with pharmacy about cost of breyna  100     09/10/2024  f/u ov/Cody Raymond re: asthma vs vcd   maint on ***  did *** bring meds No chief complaint on file.   Dyspnea:  *** Cough: *** Sleeping: *** resp cc  SABA use: *** 02: ***  Lung cancer screening :  ***    No obvious day to day or daytime variability or assoc excess/ purulent sputum or mucus plugs or hemoptysis or cp or chest tightness, subjective wheeze or overt sinus or hb symptoms.    Also denies any obvious  fluctuation of symptoms with weather or environmental changes or other aggravating or alleviating factors except as outlined above   No unusual exposure hx or h/o childhood pna/ asthma or knowledge of premature birth.  Current Allergies, Complete Past Medical History, Past Surgical History, Family History, and Social History were reviewed in [employer] record.  ROS  The following are not active complaints unless bolded Hoarseness, sore throat, dysphagia, dental problems, itching, sneezing,  nasal congestion or discharge of excess mucus or purulent secretions, ear ache,   fever, chills, sweats, unintended wt loss or wt gain, classically pleuritic or exertional cp,  orthopnea pnd or arm/hand swelling  or leg swelling, presyncope, palpitations, abdominal pain, anorexia, nausea, vomiting, diarrhea  or change in bowel habits or change in bladder habits, change in stools or change in urine, dysuria, hematuria,  rash, arthralgias, visual complaints, headache, numbness, weakness or ataxia or problems with walking or coordination,  change in mood or  memory.          Outpatient Medications Prior to Visit  Medication Sig Dispense Refill   Accu-Chek Softclix Lancets lancets TEST BLOOD SUGAR TWO TIMES DAILY AS DIRECTED 200 each 3   albuterol  (PROVENTIL ) (  2.5 MG/3ML) 0.083% nebulizer solution Take 3 mLs (2.5 mg total) by nebulization every 6 (six) hours as needed for wheezing or shortness of breath. 75 mL 5   albuterol  (VENTOLIN  HFA) 108 (90 Base) MCG/ACT inhaler Inhale 2 puffs into the lungs every 6 (six) hours as needed for wheezing or shortness of breath. 6.7 g 5   Ascorbic Acid (VITAMIN C) 1000 MG tablet Take 1,000 mg by mouth daily.     aspirin  EC 81 MG EC tablet Take 1 tablet (81 mg total) by mouth daily. 30 tablet 0   atorvastatin  (LIPITOR ) 80 MG tablet Take 1 tablet (80 mg total) by mouth daily. 90 tablet 0   betamethasone  dipropionate (DIPROLENE ) 0.05 % cream Apply topically 2  (two) times daily. 30 g 0   Blood Glucose Monitoring Suppl (ACCU-CHEK GUIDE) w/Device KIT USE AS DIRECTED 1 kit 10   budesonide -formoterol  (SYMBICORT ) 80-4.5 MCG/ACT inhaler Take 2 puffs first thing in am and then another 2 puffs about 12 hours later. 1 each 12   budesonide -glycopyrrolate-formoterol  (BREZTRI  AEROSPHERE) 160-9-4.8 MCG/ACT AERO inhaler Inhale 2 puffs into the lungs in the morning and at bedtime.     budesonide -glycopyrrolate-formoterol  (BREZTRI  AEROSPHERE) 160-9-4.8 MCG/ACT AERO inhaler Inhale 2 puffs into the lungs in the morning and at bedtime.     cetirizine (ZYRTEC) 10 MG tablet Take 10 mg by mouth daily.     clonazePAM  (KLONOPIN ) 1 MG tablet Take 1 tablet (1 mg total) by mouth 2 (two) times daily. 60 tablet 0   DULoxetine  (CYMBALTA ) 30 MG capsule TAKE 1 CAPSULE EVERY DAY 90 capsule 3   DULoxetine  (CYMBALTA ) 60 MG capsule Take 1 capsule (60 mg total) by mouth daily. Needs appt 90 capsule 0   ezetimibe  (ZETIA ) 10 MG tablet Take 1 tablet (10 mg total) by mouth daily. 90 tablet 0   famotidine  (PEPCID ) 20 MG tablet One after supper 30 tablet 11   fluticasone  (FLONASE ) 50 MCG/ACT nasal spray USE 2 SPRAYS IN EACH NOSTRIL ONE TIME DAILY 48 g 1   gabapentin  (NEURONTIN ) 300 MG capsule Take 1 capsule (300 mg total) by mouth in the morning AND 1 capsule (300 mg total) daily in the afternoon AND 2 capsules (600 mg total) at bedtime. 360 capsule 0   glucose blood (ACCU-CHEK GUIDE TEST) test strip TEST BLOOD SUGAR THREE TIMES DAILY 300 strip 1   glucose blood (ONETOUCH VERIO) test strip Use as directed. 100 each 2   ipratropium (ATROVENT ) 0.03 % nasal spray Place 2 sprays into the nose 2 (two) times daily. 30 mL 1   ketoconazole  (NIZORAL ) 2 % shampoo Apply topically 2 (two) times a week. 360 mL 1   losartan  (COZAAR ) 25 MG tablet TAKE 1 TABLET EVERY DAY 90 tablet 2   meloxicam  (MOBIC ) 7.5 MG tablet Take 1 tablet (7.5 mg total) by mouth daily. 90 tablet 0   metFORMIN  (GLUCOPHAGE -XR) 500 MG 24  hr tablet TAKE 4 TABLETS EVERY DAY WITH BREAKFAST 360 tablet 1   Metoprolol  Tartrate 75 MG TABS Take 1 tablet (75 mg total) by mouth 2 (two) times daily. 180 tablet 3   montelukast  (SINGULAIR ) 10 MG tablet Take 1 tablet (10 mg total) by mouth at bedtime. 90 tablet 0   Multiple Vitamin (MULTIVITAMIN WITH MINERALS) TABS tablet Take 1 tablet by mouth daily.     nitroGLYCERIN  (NITROSTAT ) 0.4 MG SL tablet Place 1 tablet (0.4 mg total) under the tongue every 5 (five) minutes as needed for chest pain. 25 tablet 11   pantoprazole  (  PROTONIX ) 40 MG tablet Take 1 tablet (40 mg total) by mouth daily. 90 tablet 0   tamsulosin  (FLOMAX ) 0.4 MG CAPS capsule Take 1 capsule (0.4 mg total) by mouth at bedtime. 90 capsule 1   tiZANidine  (ZANAFLEX ) 2 MG tablet TAKE 1 TABLET EVERY 6 HOURS AS NEEDED FOR MUSCLE SPASM(S) 360 tablet 3   traMADol  (ULTRAM ) 50 MG tablet TAKE 1 TABLET TWICE DAILY AS NEEDED 60 tablet 1   traZODone  (DESYREL ) 100 MG tablet Take 1 tablet (100 mg total) by mouth at bedtime. 90 tablet 0   No facility-administered medications prior to visit.        Past Medical History:  Diagnosis Date   Aneurysm (HCC)    Asthma    Blood transfusion    CAD (coronary artery disease), non obstructive on cath 2011 04/05/2012   Coronary artery disease    Diabetes mellitus    Diabetes mellitus without complication (HCC)    Enlarged prostate    GERD (gastroesophageal reflux disease)    H/O syncope    Heart disease    History of repair of patent ductus arteriosus 07/23/2016   Hyperlipidemia    Hypertension    Neuromuscular disorder (HCC)    DJD   Refusal of blood transfusions as patient is Jehovah's Witness       Objective:    Wt Readings from Last 3 Encounters:  04/17/24 203 lb (92.1 kg)  03/28/24 205 lb 9.6 oz (93.3 kg)  03/07/24 207 lb (93.9 kg)      Vital signs reviewed  09/10/2024  - Note at rest 02 sats  ***% on ***   General appearance:    ***   classic severe pseudowheeze ***  t.        Assessment       "

## 2024-09-21 ENCOUNTER — Other Ambulatory Visit: Payer: Self-pay | Admitting: Family

## 2024-09-23 ENCOUNTER — Encounter: Payer: Self-pay | Admitting: Family

## 2024-09-23 ENCOUNTER — Other Ambulatory Visit: Payer: Self-pay

## 2024-09-23 ENCOUNTER — Other Ambulatory Visit (HOSPITAL_BASED_OUTPATIENT_CLINIC_OR_DEPARTMENT_OTHER): Payer: Self-pay

## 2024-09-23 MED ORDER — ALBUTEROL SULFATE (2.5 MG/3ML) 0.083% IN NEBU
2.5000 mg | INHALATION_SOLUTION | Freq: Four times a day (QID) | RESPIRATORY_TRACT | 5 refills | Status: DC | PRN
  Filled 2024-09-23: qty 75, 7d supply, fill #0

## 2024-09-23 MED ORDER — ATORVASTATIN CALCIUM 80 MG PO TABS
80.0000 mg | ORAL_TABLET | Freq: Every day | ORAL | 0 refills | Status: DC
  Filled 2024-09-23: qty 90, 90d supply, fill #0

## 2024-09-23 MED ORDER — IPRATROPIUM BROMIDE 0.03 % NA SOLN
2.0000 | Freq: Two times a day (BID) | NASAL | 1 refills | Status: AC
  Filled 2024-09-23: qty 30, 75d supply, fill #0
  Filled 2024-10-22: qty 30, 75d supply, fill #1

## 2024-09-23 MED ORDER — EZETIMIBE 10 MG PO TABS
10.0000 mg | ORAL_TABLET | Freq: Every day | ORAL | 0 refills | Status: DC
  Filled 2024-09-23: qty 90, 90d supply, fill #0

## 2024-09-23 MED ORDER — MONTELUKAST SODIUM 10 MG PO TABS
10.0000 mg | ORAL_TABLET | Freq: Every day | ORAL | 0 refills | Status: DC
  Filled 2024-09-23: qty 90, 90d supply, fill #0

## 2024-09-23 MED ORDER — PANTOPRAZOLE SODIUM 40 MG PO TBEC
40.0000 mg | DELAYED_RELEASE_TABLET | Freq: Every day | ORAL | 0 refills | Status: DC
  Filled 2024-09-23: qty 90, 90d supply, fill #0

## 2024-09-23 NOTE — Telephone Encounter (Signed)
 Patient scheduled to see pcp tomorrow.

## 2024-09-24 ENCOUNTER — Ambulatory Visit (INDEPENDENT_AMBULATORY_CARE_PROVIDER_SITE_OTHER): Admitting: Family

## 2024-09-24 ENCOUNTER — Other Ambulatory Visit (HOSPITAL_BASED_OUTPATIENT_CLINIC_OR_DEPARTMENT_OTHER): Payer: Self-pay

## 2024-09-24 ENCOUNTER — Ambulatory Visit (HOSPITAL_BASED_OUTPATIENT_CLINIC_OR_DEPARTMENT_OTHER)
Admission: RE | Admit: 2024-09-24 | Discharge: 2024-09-24 | Disposition: A | Discharge status: Home / Self Care | Source: Ambulatory Visit | Attending: Family | Admitting: Family

## 2024-09-24 ENCOUNTER — Ambulatory Visit: Payer: Self-pay | Admitting: Family

## 2024-09-24 VITALS — BP 106/55 | HR 77 | Temp 98.0°F | Resp 16 | Ht 68.0 in | Wt 197.0 lb

## 2024-09-24 DIAGNOSIS — J4541 Moderate persistent asthma with (acute) exacerbation: Secondary | ICD-10-CM | POA: Diagnosis not present

## 2024-09-24 DIAGNOSIS — L304 Erythema intertrigo: Secondary | ICD-10-CM

## 2024-09-24 DIAGNOSIS — J45901 Unspecified asthma with (acute) exacerbation: Secondary | ICD-10-CM

## 2024-09-24 MED ORDER — FLUCONAZOLE 150 MG PO TABS
ORAL_TABLET | ORAL | 0 refills | Status: DC
  Filled 2024-09-24: qty 2, 2d supply, fill #0

## 2024-09-24 MED ORDER — PREDNISONE 10 MG PO TABS
ORAL_TABLET | ORAL | 0 refills | Status: AC
  Filled 2024-09-24: qty 30, 12d supply, fill #0

## 2024-09-24 MED ORDER — ALBUTEROL SULFATE (2.5 MG/3ML) 0.083% IN NEBU
2.5000 mg | INHALATION_SOLUTION | Freq: Four times a day (QID) | RESPIRATORY_TRACT | 5 refills | Status: AC | PRN
  Filled 2024-10-22: qty 75, 7d supply, fill #0

## 2024-09-24 MED ORDER — METHYLPREDNISOLONE SODIUM SUCC 125 MG IJ SOLR
125.0000 mg | Freq: Once | INTRAMUSCULAR | Status: AC
  Administered 2024-09-24: 125 mg via INTRAMUSCULAR

## 2024-09-24 MED ORDER — BUDESONIDE-FORMOTEROL FUMARATE 80-4.5 MCG/ACT IN AERO
INHALATION_SPRAY | RESPIRATORY_TRACT | 5 refills | Status: AC
  Filled 2024-09-24 – 2024-10-22 (×2): qty 10.2, 30d supply, fill #0

## 2024-09-24 MED ORDER — METHYLPREDNISOLONE SODIUM SUCC 125 MG IJ SOLR: 125.0000 mg | Freq: Once | INTRAMUSCULAR | Status: DC

## 2024-09-24 NOTE — Progress Notes (Signed)
 "  Subjective:     Patient ID: Cody Raymond, male    DOB: 05-07-1971, 54 y.o.   MRN: [id number]  Chief Complaint  Patient presents with   URI    Patient reports not getting better from URI. Seen at UC twice.   Cough    Patient complains of severe cough for 2 weeks.    URI  Associated symptoms include coughing.  Cough    Discussed the use of AI scribe software for clinical note transcription with the patient, who gave verbal consent to proceed.  History of Present Illness Cody Raymond is a 54 year old male with a history of asthma who presents with worsening respiratory symptoms.  He has been experiencing worsening respiratory symptoms over the past two to two and a half weeks, initially similar to those of his grandchildren who tested positive for influenza B, although he did not test positive. His symptoms include difficulty breathing, rhinorrhea, nasal drainage, headache, chest pain, and a persistent cough. He reports chest pain associated with difficulty breathing and frequently needs to take deep breaths.  He has visited urgent care twice and was prescribed an antibiotic and prednisone  for four to five days. He completed a seven-day course of doxycycline  and a five-day course of prednisone  at 40 mg per day. The high dose of prednisone  led to a yeast infection, for which he has already purchased medication. This time of year is typically when he experiences bronchitis.  He reports dark yellow sputum and significant rib pain, which he suspects might be due to coughing. He has not had a chest x-ray yet. His energy levels are low, and he feels like he is choking at times, although he has not vomited.  He is currently using a nebulizer with albuterol  but needs a refill. He also uses Symbicort , which he has run out of, and is unsure if it will be covered by his insurance this year. He is frustrated with the delivery of his medications, noting delays and difficulties in  receiving them.  His family has a high deductible health insurance plan, leading to significant out-of-pocket expenses, especially with his husband and daughter attending physical therapy sessions.  He also mentions rib pain, which he attributes to coughing.      Health Maintenance Due  Topic Date Due   Hepatitis B Vaccines 19-59 Average Risk (1 of 3 - 19+ 3-dose series) Never done   Pneumococcal Vaccine: 50+ Years (2 of 2 - PCV) 08/20/2020   Zoster Vaccines- Shingrix (1 of 2) Never done   DTaP/Tdap/Td (2 - Td or Tdap) 04/04/2022   OPHTHALMOLOGY EXAM  04/08/2023   Diabetic kidney evaluation - Urine ACR  03/09/2024   FOOT EXAM  03/09/2024   COVID-19 Vaccine (4 - 2025-26 season) 05/20/2024   HEMOGLOBIN A1C  07/25/2024    Past Medical History:  Diagnosis Date   Aneurysm    Asthma    Blood transfusion    CAD (coronary artery disease), non obstructive on cath 2011 04/05/2012   Coronary artery disease    Diabetes mellitus    Diabetes mellitus without complication (HCC)    Enlarged prostate    GERD (gastroesophageal reflux disease)    H/O syncope    Heart disease    History of repair of patent ductus arteriosus 07/23/2016   Hyperlipidemia    Hypertension    Neuromuscular disorder (HCC)    DJD   Refusal of blood transfusions as patient is Jehovah's Witness     Past  Surgical History:  Procedure Laterality Date   APPENDECTOMY     BACK SURGERY     CARDIAC CATHETERIZATION  2007   Clean Cardiac Cath [facility]), with RCA 30% narrowing likely due to catheter induced spasm in 09/02/10.   CARDIAC CATHETERIZATION  09/02/2010   mod. nonobstructive disease in the RCA and CX, tortuous LAD   COLONOSCOPY W/ POLYPECTOMY  03/17/11   diminutive polyp   LOOP RECORDER EXPLANT N/A 01/14/2014   Procedure: LOOP RECORDER EXPLANT;  Surgeon: Jerel Balding, MD;  Location: [facility] CATH LAB;  Service: Cardiovascular;  Laterality: N/A;   LOOP RECORDER IMPLANT  04/06/2012   Reveal XT 4529   LOOP  RECORDER IMPLANT N/A 04/06/2012   Procedure: LOOP RECORDER IMPLANT;  Surgeon: Jerel Balding, MD;  Location: [facility] CATH [facility];  Service: Cardiovascular;  Laterality: N/A;   NM MYOCAR PERF WALL MOTION  01/25/2008   mild anteroapical wall ischemia   PATENT DUCTUS ARTERIOUS REPAIR     at age 8    Family History  Problem Relation Age of Onset   Colon cancer Paternal Grandfather    Prostate cancer Paternal Grandfather    Aneurysm Father    Heart attack Father 5   Coronary artery disease Father    Colon polyps Mother    Heart disease Mother    Coronary artery disease Mother    Migraines Mother    Breast cancer Maternal Grandmother    Colon cancer Paternal Uncle        x 2   Stomach cancer Brother    Liver disease Other        unsure who it was   Allergies Daughter     Social History   Socioeconomic History   Marital status: Married    Spouse name: Not on file   Number of children: Not on file   Years of education: Not on file   Highest education level: 12th grade  Occupational History   Not on file  Tobacco Use   Smoking status: Never   Smokeless tobacco: Never  Vaping Use   Vaping status: Never Used  Substance and Sexual Activity   Alcohol use: No   Drug use: No   Sexual activity: Yes  Other Topics Concern   Not on file  Social History Narrative   ** Merged History Encounter **       Holter monitor 08/2010: PVCs and sinus tachy.   Sleep Study (02/2008): mild sleep apnea, no indication for CPAP.   Social Drivers of Health   Tobacco Use: Low Risk (09/17/2024)   Received from [facility]   Patient History    Smoking Tobacco Use: Never    Smokeless Tobacco Use: Never    Passive Exposure: Not on file  Financial Resource Strain: Medium Risk (01/23/2024)   Overall Financial Resource Strain (CARDIA)    Difficulty of Paying Living Expenses: Somewhat hard  Food Insecurity: Food Insecurity Present (01/23/2024)   Hunger Vital Sign    Worried About Running Out of Food in the  Last Year: Sometimes true    Ran Out of Food in the Last Year: Sometimes true  Transportation Needs: Unmet Transportation Needs (01/23/2024)   PRAPARE - Administrator, Civil Service (Medical): Yes    Lack of Transportation (Non-Medical): No  Physical Activity: Inactive (01/23/2024)   Exercise Vital Sign    Days of Exercise per Week: 0 days    Minutes of Exercise per Session: 50 min  Stress: Stress Concern Present (01/23/2024)  [employer] of Occupational Health - Occupational Stress Questionnaire    Feeling of Stress : Rather much  Social Connections: Moderately Isolated (01/23/2024)   Social Connection and Isolation Panel    Frequency of Communication with Friends and Family: Once a week    Frequency of Social Gatherings with Friends and Family: Never    Attends Religious Services: Never    Database Administrator or Organizations: Yes    Attends Banker Meetings: 1 to 4 times per year    Marital Status: Married  Catering Manager Violence: Not At Risk (12/21/2023)   Humiliation, Afraid, Rape, and Kick questionnaire    Fear of Current or Ex-Partner: No    Emotionally Abused: No    Physically Abused: No    Sexually Abused: No  Depression (PHQ2-9): High Risk (09/24/2024)   Depression (PHQ2-9)    PHQ-2 Score: 16  Alcohol Screen: Low Risk (12/14/2023)   Alcohol Screen    Last Alcohol Screening Score (AUDIT): 0  Housing: High Risk (01/23/2024)   Housing Stability Vital Sign    Unable to Pay for Housing in the Last Year: Yes    Number of Times Moved in the Last Year: Not on file    Homeless in the Last Year: No  Utilities: Not At Risk (12/21/2023)   [facility] Utilities    Threatened with loss of utilities: No  Recent Concern: Utilities - At Risk (12/14/2023)   [facility] Utilities    Threatened with loss of utilities: Yes  Health Literacy: Not on file    Outpatient Medications Prior to Visit  Medication Sig Dispense Refill   Accu-Chek Softclix Lancets lancets TEST BLOOD  SUGAR TWO TIMES DAILY AS DIRECTED 200 each 3   albuterol  (VENTOLIN  HFA) 108 (90 Base) MCG/ACT inhaler Inhale 2 puffs into the lungs every 6 (six) hours as needed for wheezing or shortness of breath. 6.7 g 5   Ascorbic Acid (VITAMIN C) 1000 MG tablet Take 1,000 mg by mouth daily.     aspirin  EC 81 MG EC tablet Take 1 tablet (81 mg total) by mouth daily. 30 tablet 0   atorvastatin  (LIPITOR ) 80 MG tablet Take 1 tablet (80 mg total) by mouth daily. 90 tablet 0   betamethasone  dipropionate (DIPROLENE ) 0.05 % cream Apply topically 2 (two) times daily. 30 g 0   Blood Glucose Monitoring Suppl (ACCU-CHEK GUIDE) w/Device KIT USE AS DIRECTED 1 kit 10   cetirizine (ZYRTEC) 10 MG tablet Take 10 mg by mouth daily.     clonazePAM  (KLONOPIN ) 1 MG tablet Take 1 tablet (1 mg total) by mouth 2 (two) times daily. 60 tablet 0   DULoxetine  (CYMBALTA ) 30 MG capsule TAKE 1 CAPSULE EVERY DAY 90 capsule 3   DULoxetine  (CYMBALTA ) 60 MG capsule Take 1 capsule (60 mg total) by mouth daily. Needs appt 90 capsule 0   ezetimibe  (ZETIA ) 10 MG tablet Take 1 tablet (10 mg total) by mouth daily. 90 tablet 0   famotidine  (PEPCID ) 20 MG tablet One after supper 30 tablet 11   fluticasone  (FLONASE ) 50 MCG/ACT nasal spray USE 2 SPRAYS IN EACH NOSTRIL ONE TIME DAILY 48 g 1   gabapentin  (NEURONTIN ) 300 MG capsule Take 1 capsule (300 mg total) by mouth in the morning AND 1 capsule (300 mg total) daily in the afternoon AND 2 capsules (600 mg total) at bedtime. 360 capsule 0   glucose blood (ACCU-CHEK GUIDE TEST) test strip TEST BLOOD SUGAR THREE TIMES DAILY 300 strip 1   glucose  blood (ONETOUCH VERIO) test strip Use as directed. 100 each 2   ipratropium (ATROVENT ) 0.03 % nasal spray Place 2 sprays into the nose 2 (two) times daily. 30 mL 1   ketoconazole  (NIZORAL ) 2 % shampoo Apply topically 2 (two) times a week. 360 mL 1   losartan  (COZAAR ) 25 MG tablet TAKE 1 TABLET EVERY DAY 90 tablet 2   meloxicam  (MOBIC ) 7.5 MG tablet Take 1 tablet  (7.5 mg total) by mouth daily. 90 tablet 0   metFORMIN  (GLUCOPHAGE -XR) 500 MG 24 hr tablet TAKE 4 TABLETS EVERY DAY WITH BREAKFAST 360 tablet 1   Metoprolol  Tartrate 75 MG TABS Take 1 tablet (75 mg total) by mouth 2 (two) times daily. 180 tablet 3   montelukast  (SINGULAIR ) 10 MG tablet Take 1 tablet (10 mg total) by mouth at bedtime. 90 tablet 0   Multiple Vitamin (MULTIVITAMIN WITH MINERALS) TABS tablet Take 1 tablet by mouth daily.     nitroGLYCERIN  (NITROSTAT ) 0.4 MG SL tablet Place 1 tablet (0.4 mg total) under the tongue every 5 (five) minutes as needed for chest pain. 25 tablet 11   pantoprazole  (PROTONIX ) 40 MG tablet Take 1 tablet (40 mg total) by mouth daily. 90 tablet 0   tamsulosin  (FLOMAX ) 0.4 MG CAPS capsule Take 1 capsule (0.4 mg total) by mouth at bedtime. 90 capsule 1   tiZANidine  (ZANAFLEX ) 2 MG tablet TAKE 1 TABLET EVERY 6 HOURS AS NEEDED FOR MUSCLE SPASM(S) 360 tablet 3   traMADol  (ULTRAM ) 50 MG tablet TAKE 1 TABLET TWICE DAILY AS NEEDED 60 tablet 1   traZODone  (DESYREL ) 100 MG tablet Take 1 tablet (100 mg total) by mouth at bedtime. 90 tablet 0   albuterol  (PROVENTIL ) (2.5 MG/3ML) 0.083% nebulizer solution Take 3 mLs (2.5 mg total) by nebulization every 6 (six) hours as needed for wheezing or shortness of breath. 75 mL 5   budesonide -formoterol  (SYMBICORT ) 80-4.5 MCG/ACT inhaler Take 2 puffs first thing in am and then another 2 puffs about 12 hours later. 1 each 12   budesonide -glycopyrrolate-formoterol  (BREZTRI  AEROSPHERE) 160-9-4.8 MCG/ACT AERO inhaler Inhale 2 puffs into the lungs in the morning and at bedtime.     budesonide -glycopyrrolate-formoterol  (BREZTRI  AEROSPHERE) 160-9-4.8 MCG/ACT AERO inhaler Inhale 2 puffs into the lungs in the morning and at bedtime.     No facility-administered medications prior to visit.    Allergies[1]  Review of Systems  Respiratory:  Positive for cough.        Objective:    Physical Exam Constitutional:      General: He is not in  acute distress.    Appearance: He is well-developed.  HENT:     Head: Normocephalic and atraumatic.  Cardiovascular:     Rate and Rhythm: Normal rate and regular rhythm.     Heart sounds: No murmur heard. Pulmonary:     Effort: Pulmonary effort is normal. No respiratory distress.     Breath sounds: Examination of the right-lower field reveals wheezing. Examination of the left-lower field reveals wheezing. Wheezing present. No rales.  Skin:    General: Skin is warm and dry.  Neurological:     Mental Status: He is alert and oriented to person, place, and time.  Psychiatric:        Behavior: Behavior normal.        Thought Content: Thought content normal.      BP (!) 106/55 (BP Location: Right Arm, Patient Position: Sitting, Cuff Size: Normal)   Pulse 77   Temp 98 F (36.7  C) (Oral)   Resp 16   Ht 5' 8 (1.727 m)   Wt 197 lb (89.4 kg)   SpO2 98%   BMI 29.95 kg/m  Wt Readings from Last 3 Encounters:  09/24/24 197 lb (89.4 kg)  04/17/24 203 lb (92.1 kg)  03/28/24 205 lb 9.6 oz (93.3 kg)       Assessment & Plan:   Problem List Items Addressed This Visit       Unprioritized   Moderate asthma with acute exacerbation   Acute exacerbation with severe dyspnea, wheezing, and chest pain. Concerns about potential pneumonia due to dark yellow sputum. Risk of rebound asthma if prednisone  is tapered too quickly. - Administered Solu-Medrol  injection in the office. - Prescribed steroid taper: 40 mg for 3 days, 30 mg for 3 days, 20 mg for 3 days, 10 mg for 3 days. - Ordered chest x-ray to rule out pneumonia. - Ensured adequate supply of albuterol  for nebulizer. - Sent prescription for Symbicort  to pharmacy- to restart. He will let me know if it is not covered by insurance.      Relevant Medications   predniSONE  (DELTASONE ) 10 MG tablet   albuterol  (PROVENTIL ) (2.5 MG/3ML) 0.083% nebulizer solution   budesonide -formoterol  (SYMBICORT ) 80-4.5 MCG/ACT inhaler   Other Relevant Orders    DG Chest 2 View   Other Visit Diagnoses       Intertrigo    -  Primary   Relevant Medications   fluconazole  (DIFLUCAN ) 150 MG tablet      Groin area, likely secondary to recent antibiotic use. Symptoms improving with current treatment. - Provided medication for intertrigo.  Assessment & Plan    I have discontinued Cody Raymond's Breztri  Aerosphere and Breztri  Aerosphere. I am also having him start on predniSONE  and fluconazole . Additionally, I am having him maintain his cetirizine, multivitamin with minerals, vitamin C, aspirin  EC, betamethasone  dipropionate, ketoconazole , Accu-Chek Guide, tiZANidine , albuterol , Accu-Chek Softclix Lancets, tamsulosin , OneTouch Verio, famotidine , DULoxetine , Metoprolol  Tartrate, nitroGLYCERIN , traZODone , Accu-Chek Guide Test, metFORMIN , fluticasone , traMADol , meloxicam , DULoxetine , losartan , gabapentin , clonazePAM , ipratropium, ezetimibe , montelukast , pantoprazole , atorvastatin , albuterol , and budesonide -formoterol .  Meds ordered this encounter  Medications   predniSONE  (DELTASONE ) 10 MG tablet    Sig: Take 4 tablets (40 mg total) by mouth daily for 3 days, THEN 3 tablets (30 mg total) daily for 3 days, THEN 2 tablets (20 mg total) daily for 3 days, THEN 1 tablet (10 mg total) daily for 3 days.    Dispense:  30 tablet    Refill:  0    Supervising Provider:   DOMENICA BLACKBIRD A [[id number]]   fluconazole  (DIFLUCAN ) 150 MG tablet    Sig: Take 1 tablet by mouth today. May repeat in 7 days if rash not improved    Dispense:  2 tablet    Refill:  0    Supervising Provider:   DOMENICA BLACKBIRD A [[id number]]   albuterol  (PROVENTIL ) (2.5 MG/3ML) 0.083% nebulizer solution    Sig: Take 3 mLs (2.5 mg total) by nebulization every 6 (six) hours as needed for wheezing or shortness of breath.    Dispense:  75 mL    Refill:  5    Supervising Provider:   DOMENICA BLACKBIRD A [[id number]]   budesonide -formoterol  (SYMBICORT ) 80-4.5 MCG/ACT inhaler    Sig: Take 2 puffs first thing  in am and then another 2 puffs about 12 hours later.    Dispense:  10.2 g    Refill:  5    Generic or Breyna  80 Take  2 puffs first thing in am and then another 2 puffs about 12 hours later.    Supervising Provider:   DOMENICA BLACKBIRD A [[id number]]      [1]  Allergies Allergen Reactions   Diphenhydramine     Other reaction(s): confusion  Other Reaction(s): Other (See Comments)  drives me nuts    Other reaction(s): confusion    Other Reaction(s): Mental Status Changes   Diphenhydramine Hcl     Other Reaction(s): Mental Status Changes   Benadryl [Diphenhydramine Hcl]     drives me nuts   "

## 2024-09-24 NOTE — Assessment & Plan Note (Signed)
 Acute exacerbation with severe dyspnea, wheezing, and chest pain. Concerns about potential pneumonia due to dark yellow sputum. Risk of rebound asthma if prednisone  is tapered too quickly. - Administered Solu-Medrol  injection in the office. - Prescribed steroid taper: 40 mg for 3 days, 30 mg for 3 days, 20 mg for 3 days, 10 mg for 3 days. - Ordered chest x-ray to rule out pneumonia. - Ensured adequate supply of albuterol  for nebulizer. - Sent prescription for Symbicort  to pharmacy- to restart. He will let me know if it is not covered by insurance.

## 2024-09-24 NOTE — Patient Instructions (Signed)
" °  VISIT SUMMARY: Today, you were seen for worsening respiratory symptoms, including difficulty breathing, chest pain, and a persistent cough. You have a history of bronchitis and have been experiencing these symptoms for the past two to two and a half weeks. You were previously treated with antibiotics and prednisone , which led to a yeast infection. You are currently using a nebulizer with albuterol  and Symbicort , but you need refills. We discussed your symptoms, treatment plan, and the need for further evaluation.  YOUR PLAN: -ASTHMA WITH ACUTE EXACERBATION: You are experiencing a severe worsening of your asthma, which is causing difficulty breathing, wheezing, and chest pain. This could be due to a potential infection like pneumonia. We gave you a Solu-Medrol  injection today and prescribed a steroid taper to help manage your symptoms. We also ordered a chest x-ray to check for pneumonia and ensured you have enough albuterol  for your nebulizer. A prescription for Symbicort  was sent to your pharmacy.  -INTERTRIGO: You have a skin infection in your groin area, likely caused by recent antibiotic use. This condition is improving with your current treatment, and we provided you with medication to continue managing it.  -GENERAL HEALTH MAINTENANCE: We will verify if you received your flu shot last year to ensure you are up to date with your vaccinations.  INSTRUCTIONS: Please follow the prescribed steroid taper: 40 mg for 3 days, 30 mg for 3 days, 20 mg for 3 days, and 10 mg for 3 days. Get a chest x-ray as ordered to rule out pneumonia. Ensure you have an adequate supply of albuterol  for your nebulizer and pick up your Symbicort  prescription from the pharmacy. We will check your flu shot status and update your records accordingly.                      "

## 2024-09-25 ENCOUNTER — Encounter: Admitting: Family

## 2024-10-02 ENCOUNTER — Encounter: Admitting: Family

## 2024-10-07 ENCOUNTER — Other Ambulatory Visit (HOSPITAL_BASED_OUTPATIENT_CLINIC_OR_DEPARTMENT_OTHER): Payer: Self-pay

## 2024-10-12 ENCOUNTER — Other Ambulatory Visit: Payer: Self-pay | Admitting: Family

## 2024-10-12 DIAGNOSIS — E1142 Type 2 diabetes mellitus with diabetic polyneuropathy: Secondary | ICD-10-CM

## 2024-10-17 ENCOUNTER — Other Ambulatory Visit: Payer: Self-pay | Admitting: Family

## 2024-10-17 DIAGNOSIS — F418 Other specified anxiety disorders: Secondary | ICD-10-CM

## 2024-10-18 ENCOUNTER — Other Ambulatory Visit (HOSPITAL_BASED_OUTPATIENT_CLINIC_OR_DEPARTMENT_OTHER): Payer: Self-pay

## 2024-10-18 ENCOUNTER — Other Ambulatory Visit: Payer: Self-pay

## 2024-10-18 MED ORDER — CLONAZEPAM 1 MG PO TABS
1.0000 mg | ORAL_TABLET | Freq: Two times a day (BID) | ORAL | 0 refills | Status: AC
  Filled 2024-10-18: qty 60, 30d supply, fill #0

## 2024-10-22 ENCOUNTER — Other Ambulatory Visit: Payer: Self-pay | Admitting: Family

## 2024-10-22 ENCOUNTER — Other Ambulatory Visit: Payer: Self-pay

## 2024-10-22 ENCOUNTER — Other Ambulatory Visit (HOSPITAL_BASED_OUTPATIENT_CLINIC_OR_DEPARTMENT_OTHER): Payer: Self-pay

## 2024-10-22 DIAGNOSIS — G4700 Insomnia, unspecified: Secondary | ICD-10-CM

## 2024-10-22 DIAGNOSIS — E1142 Type 2 diabetes mellitus with diabetic polyneuropathy: Secondary | ICD-10-CM

## 2024-10-22 DIAGNOSIS — L304 Erythema intertrigo: Secondary | ICD-10-CM

## 2024-10-22 DIAGNOSIS — F418 Other specified anxiety disorders: Secondary | ICD-10-CM

## 2024-10-22 DIAGNOSIS — E785 Hyperlipidemia, unspecified: Secondary | ICD-10-CM

## 2024-10-22 DIAGNOSIS — M545 Low back pain, unspecified: Secondary | ICD-10-CM

## 2024-10-22 DIAGNOSIS — J45909 Unspecified asthma, uncomplicated: Secondary | ICD-10-CM

## 2024-10-22 DIAGNOSIS — N401 Enlarged prostate with lower urinary tract symptoms: Secondary | ICD-10-CM

## 2024-10-22 DIAGNOSIS — K219 Gastro-esophageal reflux disease without esophagitis: Secondary | ICD-10-CM

## 2024-10-22 MED ORDER — TAMSULOSIN HCL 0.4 MG PO CAPS
0.4000 mg | ORAL_CAPSULE | Freq: Every day | ORAL | 1 refills | Status: AC
  Filled 2024-10-22: qty 90, 90d supply, fill #0

## 2024-10-22 MED ORDER — EZETIMIBE 10 MG PO TABS
10.0000 mg | ORAL_TABLET | Freq: Every day | ORAL | 1 refills | Status: AC
  Filled 2024-10-22: qty 90, 90d supply, fill #0

## 2024-10-22 MED ORDER — ATORVASTATIN CALCIUM 80 MG PO TABS
80.0000 mg | ORAL_TABLET | Freq: Every day | ORAL | 1 refills | Status: AC
  Filled 2024-10-22: qty 90, 90d supply, fill #0

## 2024-10-22 MED ORDER — PANTOPRAZOLE SODIUM 40 MG PO TBEC
40.0000 mg | DELAYED_RELEASE_TABLET | Freq: Every day | ORAL | 1 refills | Status: AC
  Filled 2024-10-22: qty 90, 90d supply, fill #0

## 2024-10-22 MED ORDER — TRAZODONE HCL 100 MG PO TABS
100.0000 mg | ORAL_TABLET | Freq: Every day | ORAL | 1 refills | Status: AC
  Filled 2024-10-22: qty 90, 90d supply, fill #0

## 2024-10-22 MED ORDER — FLUCONAZOLE 150 MG PO TABS
ORAL_TABLET | ORAL | 0 refills | Status: AC
  Filled 2024-10-22: qty 2, 7d supply, fill #0

## 2024-10-22 MED ORDER — GABAPENTIN 300 MG PO CAPS
ORAL_CAPSULE | ORAL | 1 refills | Status: AC
  Filled 2024-10-22: qty 360, 90d supply, fill #0

## 2024-10-22 MED ORDER — MONTELUKAST SODIUM 10 MG PO TABS
10.0000 mg | ORAL_TABLET | Freq: Every day | ORAL | 1 refills | Status: AC
  Filled 2024-10-22: qty 90, 90d supply, fill #0

## 2024-10-22 MED ORDER — MELOXICAM 7.5 MG PO TABS
7.5000 mg | ORAL_TABLET | Freq: Every day | ORAL | 0 refills | Status: AC
  Filled 2024-10-22: qty 90, 90d supply, fill #0

## 2024-10-22 MED ORDER — ALBUTEROL SULFATE HFA 108 (90 BASE) MCG/ACT IN AERS
2.0000 | INHALATION_SPRAY | Freq: Four times a day (QID) | RESPIRATORY_TRACT | 5 refills | Status: AC | PRN
  Filled 2024-10-22: qty 6.7, 25d supply, fill #0

## 2024-11-06 ENCOUNTER — Encounter: Admitting: Family
